# Patient Record
Sex: Female | Born: 1965 | Race: Black or African American | Hispanic: No | Marital: Married | Smoking: Never smoker
Health system: Southern US, Community
[De-identification: ages and names within clinical notes are randomized; demographics above are authoritative.]

## PROBLEM LIST (undated history)

## (undated) DIAGNOSIS — I639 Cerebral infarction, unspecified: Secondary | ICD-10-CM

## (undated) DIAGNOSIS — M1990 Unspecified osteoarthritis, unspecified site: Secondary | ICD-10-CM

## (undated) DIAGNOSIS — E119 Type 2 diabetes mellitus without complications: Secondary | ICD-10-CM

## (undated) DIAGNOSIS — I10 Essential (primary) hypertension: Secondary | ICD-10-CM

## (undated) DIAGNOSIS — D649 Anemia, unspecified: Secondary | ICD-10-CM

## (undated) HISTORY — DX: Anemia, unspecified: D64.9

## (undated) HISTORY — DX: Cerebral infarction, unspecified: I63.9

## (undated) HISTORY — DX: Unspecified osteoarthritis, unspecified site: M19.90

---

## 1999-03-09 ENCOUNTER — Encounter: Payer: Self-pay | Admitting: *Deleted

## 1999-03-09 ENCOUNTER — Ambulatory Visit (HOSPITAL_COMMUNITY): Admission: RE | Admit: 1999-03-09 | Discharge: 1999-03-09 | Payer: Self-pay | Admitting: *Deleted

## 1999-07-13 ENCOUNTER — Encounter (INDEPENDENT_AMBULATORY_CARE_PROVIDER_SITE_OTHER): Payer: Self-pay

## 1999-07-13 ENCOUNTER — Encounter: Payer: Self-pay | Admitting: Obstetrics

## 1999-07-13 ENCOUNTER — Inpatient Hospital Stay (HOSPITAL_COMMUNITY): Admission: AD | Admit: 1999-07-13 | Discharge: 1999-07-16 | Payer: Self-pay | Admitting: *Deleted

## 1999-08-11 ENCOUNTER — Encounter: Admission: RE | Admit: 1999-08-11 | Discharge: 1999-11-09 | Payer: Self-pay | Admitting: *Deleted

## 2000-04-11 ENCOUNTER — Ambulatory Visit (HOSPITAL_COMMUNITY): Admission: RE | Admit: 2000-04-11 | Discharge: 2000-04-11 | Payer: Self-pay | Admitting: *Deleted

## 2009-09-15 ENCOUNTER — Encounter: Payer: Self-pay | Admitting: Internal Medicine

## 2009-09-24 LAB — CONVERTED CEMR LAB: Pap Smear: NORMAL

## 2009-09-30 ENCOUNTER — Encounter: Payer: Self-pay | Admitting: Internal Medicine

## 2009-10-04 ENCOUNTER — Ambulatory Visit: Payer: Self-pay | Admitting: Internal Medicine

## 2009-10-04 DIAGNOSIS — E1165 Type 2 diabetes mellitus with hyperglycemia: Secondary | ICD-10-CM

## 2009-10-04 DIAGNOSIS — E7800 Pure hypercholesterolemia, unspecified: Secondary | ICD-10-CM

## 2009-10-04 DIAGNOSIS — IMO0002 Reserved for concepts with insufficient information to code with codable children: Secondary | ICD-10-CM | POA: Insufficient documentation

## 2009-11-08 ENCOUNTER — Telehealth: Payer: Self-pay | Admitting: Internal Medicine

## 2009-11-08 ENCOUNTER — Ambulatory Visit: Payer: Self-pay | Admitting: Internal Medicine

## 2010-08-04 ENCOUNTER — Ambulatory Visit: Payer: Self-pay | Admitting: Internal Medicine

## 2010-08-04 LAB — CONVERTED CEMR LAB
ALT: 24 units/L (ref 0–35)
AST: 20 units/L (ref 0–37)
Albumin: 3.7 g/dL (ref 3.5–5.2)
Alkaline Phosphatase: 59 units/L (ref 39–117)
BUN: 10 mg/dL (ref 6–23)
Basophils Absolute: 0.1 10*3/uL (ref 0.0–0.1)
Basophils Relative: 1.3 % (ref 0.0–3.0)
Bilirubin Urine: NEGATIVE
Bilirubin, Direct: 0 mg/dL (ref 0.0–0.3)
CO2: 25 meq/L (ref 19–32)
Calcium: 9.2 mg/dL (ref 8.4–10.5)
Chloride: 102 meq/L (ref 96–112)
Cholesterol: 256 mg/dL — ABNORMAL HIGH (ref 0–200)
Creatinine, Ser: 0.6 mg/dL (ref 0.4–1.2)
Creatinine,U: 52.7 mg/dL
Direct LDL: 181.9 mg/dL
Eosinophils Absolute: 0.1 10*3/uL (ref 0.0–0.7)
Eosinophils Relative: 1.8 % (ref 0.0–5.0)
GFR calc non Af Amer: 148.33 mL/min (ref >60)
Glucose, Bld: 307 mg/dL — ABNORMAL HIGH (ref 70–99)
HCT: 39.6 % (ref 36.0–46.0)
HDL: 52.2 mg/dL (ref >39.00)
Hemoglobin, Urine: NEGATIVE
Hemoglobin: 13.5 g/dL (ref 12.0–15.0)
Hgb A1c MFr Bld: 8.5 % — ABNORMAL HIGH (ref 4.6–6.5)
Ketones, ur: NEGATIVE mg/dL
Leukocytes, UA: NEGATIVE
Lymphocytes Relative: 28.5 % (ref 12.0–46.0)
Lymphs Abs: 2.2 10*3/uL (ref 0.7–4.0)
MCHC: 34.2 g/dL (ref 30.0–36.0)
MCV: 92 fL (ref 78.0–100.0)
Microalb Creat Ratio: 1.1 mg/g (ref 0.0–30.0)
Microalb, Ur: 0.6 mg/dL (ref 0.0–1.9)
Monocytes Absolute: 0.6 10*3/uL (ref 0.1–1.0)
Monocytes Relative: 7.3 % (ref 3.0–12.0)
Neutro Abs: 4.8 10*3/uL (ref 1.4–7.7)
Neutrophils Relative %: 61.1 % (ref 43.0–77.0)
Nitrite: NEGATIVE
Platelets: 328 10*3/uL (ref 150.0–400.0)
Potassium: 4.5 meq/L (ref 3.5–5.1)
RBC: 4.31 M/uL (ref 3.87–5.11)
RDW: 13.6 % (ref 11.5–14.6)
Sodium: 134 meq/L — ABNORMAL LOW (ref 135–145)
Specific Gravity, Urine: 1.015 (ref 1.000–1.030)
TSH: 1.03 microintl units/mL (ref 0.35–5.50)
Total Bilirubin: 0.7 mg/dL (ref 0.3–1.2)
Total CHOL/HDL Ratio: 5
Total Protein, Urine: NEGATIVE mg/dL
Total Protein: 6.9 g/dL (ref 6.0–8.3)
Triglycerides: 140 mg/dL (ref 0.0–149.0)
Urine Glucose: >=1000 mg/dL
Urobilinogen, UA: 0.2 (ref 0.0–1.0)
VLDL: 28 mg/dL (ref 0.0–40.0)
WBC: 7.9 10*3/uL (ref 4.5–10.5)
pH: 6.5 (ref 5.0–8.0)

## 2010-08-05 ENCOUNTER — Telehealth: Payer: Self-pay | Admitting: Internal Medicine

## 2010-08-17 ENCOUNTER — Encounter: Admission: RE | Admit: 2010-08-17 | Discharge: 2010-08-17 | Payer: Self-pay | Admitting: Internal Medicine

## 2010-08-22 ENCOUNTER — Encounter
Admission: RE | Admit: 2010-08-22 | Discharge: 2010-08-22 | Payer: Self-pay | Source: Home / Self Care | Attending: Internal Medicine | Admitting: Internal Medicine

## 2010-08-30 ENCOUNTER — Telehealth: Payer: Self-pay | Admitting: Internal Medicine

## 2010-09-13 ENCOUNTER — Telehealth: Payer: Self-pay | Admitting: Internal Medicine

## 2010-11-06 ENCOUNTER — Encounter: Payer: Self-pay | Admitting: Obstetrics

## 2010-11-15 NOTE — Assessment & Plan Note (Signed)
Summary: 1 MO ROV /NWS  #   Vital Signs:  Patient profile:   45 year old female Menstrual status:  regular Height:      62 inches Weight:      223 pounds BMI:     40.93 O2 Sat:      98 % on Room air Temp:     98.5 degrees F oral Pulse rate:   70 / minute Pulse rhythm:   regular Resp:     16 per minute BP sitting:   136 / 80  (left arm) Cuff size:   large  Vitals Entered By: Rock Nephew CMA (November 08, 2009 8:38 AM)  O2 Flow:  Room air CC: follow-up visit, Hypertension Management Is Patient Diabetic? Yes Did you bring your meter with you today? No   Primary Care Provider:  Etta Grandchild MD  CC:  follow-up visit and Hypertension Management.  History of Present Illness: She returns stating that she feels well. Her BS has come down to 120-145.  Hypertension History:      She denies headache, chest pain, palpitations, dyspnea with exertion, orthopnea, PND, peripheral edema, visual symptoms, neurologic problems, syncope, and side effects from treatment.  She notes no problems with any antihypertensive medication side effects.        Positive major cardiovascular risk factors include diabetes, hyperlipidemia, and hypertension.  Negative major cardiovascular risk factors include female age less than 24 years old, negative family history for ischemic heart disease, and non-tobacco-user status.        Further assessment for target organ damage reveals no history of ASHD, cardiac end-organ damage (CHF/LVH), stroke/TIA, peripheral vascular disease, renal insufficiency, or hypertensive retinopathy.     Current Medications (verified): 1)  Janumet 50-500 Mg Tabs (Sitagliptin-Metformin Hcl) .... One By Mouth Two Times A Day For Diabetes 2)  Valturna 150-160 Mg Tabs (Aliskiren-Valsartan) .... One By Mouth Once Daily For High Blood Pressure  Allergies (verified): No Known Drug Allergies  Past History:  Past Medical History: Reviewed history from 10/04/2009 and no changes  required. Diabetes mellitus, type II Hyperlipidemia Hypertension  Past Surgical History: Reviewed history from 10/04/2009 and no changes required. Tubal ligation Caesarean section  Family History: Reviewed history from 10/04/2009 and no changes required. Family History Diabetes 1st degree relative Family History High cholesterol Family History Hypertension  Social History: Reviewed history from 10/04/2009 and no changes required. Occupation: Associate Professor Married Never Smoked Alcohol use-yes Drug use-no Regular exercise-no  Review of Systems  The patient denies anorexia, abdominal pain, difficulty walking, and depression.   Endo:  Denies cold intolerance, excessive hunger, excessive thirst, excessive urination, polyuria, and weight change.  Physical Exam  General:  alert, well-developed, well-nourished, well-hydrated, appropriate dress, normal appearance, healthy-appearing, cooperative to examination, good hygiene, and overweight-appearing.   Mouth:  Oral mucosa and oropharynx without lesions or exudates.  Teeth in good repair. Neck:  supple, full ROM, no masses, no thyromegaly, no thyroid nodules or tenderness, no JVD, no cervical lymphadenopathy, and no neck tenderness.   Lungs:  normal respiratory effort, no intercostal retractions, no accessory muscle use, normal breath sounds, and no dullness.   Heart:  normal rate, regular rhythm, no murmur, no gallop, no rub, and no JVD.   Abdomen:  soft, non-tender, normal bowel sounds, no distention, no masses, no guarding, no rigidity, no rebound tenderness, no hepatomegaly, and no splenomegaly.   Msk:  normal ROM, no joint tenderness, no joint swelling, no joint warmth, no redness over joints, no joint deformities,  no joint instability, and no crepitation.   Pulses:  R and L carotid,radial,femoral,dorsalis pedis and posterior tibial pulses are full and equal bilaterally Extremities:  No clubbing, cyanosis, edema, or deformity noted  with normal full range of motion of all joints.   Neurologic:  No cranial nerve deficits noted. Station and gait are normal. Plantar reflexes are down-going bilaterally. DTRs are symmetrical throughout. Sensory, motor and coordinative functions appear intact. Skin:  turgor normal, color normal, no rashes, no suspicious lesions, no ecchymoses, no petechiae, and no purpura.   Cervical Nodes:  no anterior cervical adenopathy and no posterior cervical adenopathy.   Psych:  Oriented X3, memory intact for recent and remote, normally interactive, good eye contact, not anxious appearing, not agitated, not suicidal, and tearful.    Diabetes Management Exam:    Foot Exam (with socks and/or shoes not present):       Sensory-Pinprick/Light touch:          Left medial foot (L-4): normal          Left dorsal foot (L-5): normal          Left lateral foot (S-1): normal          Right medial foot (L-4): normal          Right dorsal foot (L-5): normal          Right lateral foot (S-1): normal       Sensory-Monofilament:          Left foot: normal          Right foot: normal       Inspection:          Left foot: normal          Right foot: normal       Nails:          Left foot: normal          Right foot: normal   Impression & Recommendations:  Problem # 1:  HYPERTENSION (ICD-401.9) Assessment Improved  Her updated medication list for this problem includes:    Valturna 150-160 Mg Tabs (Aliskiren-valsartan) ..... One by mouth once daily for high blood pressure  BP today: 136/80 Prior BP: 158/100 (10/04/2009)  Prior 10 Yr Risk Heart Disease: Not enough information (10/04/2009)  Problem # 2:  DIABETES MELLITUS, TYPE II (ICD-250.00) Assessment: Improved  Her updated medication list for this problem includes:    Janumet 50-500 Mg Tabs (Sitagliptin-metformin hcl) ..... One by mouth two times a day for diabetes  Orders: Ophthalmology Referral (Ophthalmology)  Complete Medication List: 1)   Janumet 50-500 Mg Tabs (Sitagliptin-metformin hcl) .... One by mouth two times a day for diabetes 2)  Valturna 150-160 Mg Tabs (Aliskiren-valsartan) .... One by mouth once daily for high blood pressure 3)  Onetouch Ultra Mini W/device Kit (Blood glucose monitoring suppl) .... Use two times a day as directed  Hypertension Assessment/Plan:      The patient's hypertensive risk group is category C: Target organ damage and/or diabetes.  Today's blood pressure is 136/80.  Her blood pressure goal is < 130/80.  Patient Instructions: 1)  Please schedule a follow-up appointment in 3 months. 2)  It is important that you exercise regularly at least 20 minutes 5 times a week. If you develop chest pain, have severe difficulty breathing, or feel very tired , stop exercising immediately and seek medical attention. 3)  You need to lose weight. Consider a lower calorie diet and regular exercise.  4)  Check your blood sugars regularly. If your readings are usually above : or below 70 you should contact our office. 5)  It is important that your Diabetic A1c level is checked every 3 months. 6)  See your eye doctor yearly to check for diabetic eye damage. 7)  Check your feet each night for sore areas, calluses or signs of infection. 8)  Check your Blood Pressure regularly. If it is above: you should make an appointment. Prescriptions: VALTURNA 150-160 MG TABS (ALISKIREN-VALSARTAN) One by mouth once daily for high blood pressure  #28 x 0   Entered and Authorized by:   Etta Grandchild MD   Signed by:   Etta Grandchild MD on 11/08/2009   Method used:   Samples Given   RxID:   [id number] JANUMET 50-500 MG TABS (SITAGLIPTIN-METFORMIN HCL) One by mouth two times a day for diabetes  #112 x 0   Entered and Authorized by:   Etta Grandchild MD   Signed by:   Etta Grandchild MD on 11/08/2009   Method used:   Samples Given   RxID:   [id number] ONETOUCH ULTRA MINI W/DEVICE KIT (BLOOD GLUCOSE MONITORING SUPPL)  Use two times a day as directed  #1 x 0   Entered and Authorized by:   Etta Grandchild MD   Signed by:   Etta Grandchild MD on 11/08/2009   Method used:   Samples Given   RxID:   [id number]

## 2010-11-15 NOTE — Letter (Signed)
Summary: Lipid Letter  [facility]  [address] [place], [place] [address]   Phone: [phone]  Fax: [phone]    08/04/2010  Harika Laidlaw [address] [place], [place]  [address]  Dear Kathie Rhodes:  We have carefully reviewed your last lipid profile from  and the results are noted below with a summary of recommendations for lipid management.    Cholesterol:       256     Goal: <200 high   HDL "good" Cholesterol:   62.95     Goal: >50    LDL "bad" Cholesterol:   182     Goal: <100 high   Triglycerides:       140.0     Goal: <150        TLC Diet (Therapeutic Lifestyle Change): Saturated Fats & Transfatty acids should be kept < 7% of total calories ***Reduce Saturated Fats Polyunstaurated Fat can be up to 10% of total calories Monounsaturated Fat Fat can be up to 20% of total calories Total Fat should be no greater than 25-35% of total calories Carbohydrates should be 50-60% of total calories Protein should be approximately 15% of total calories Fiber should be at least 20-30 grams a day ***Increased fiber may help lower LDL Total Cholesterol should be < 200mg /day Consider adding plant stanol/sterols to diet (example: Benacol spread) ***A higher intake of unsaturated fat may reduce Triglycerides and Increase HDL    Adjunctive Measures (may lower LIPIDS and reduce risk of Heart Attack) include: Aerobic Exercise (20-30 minutes 3-4 times a week) Limit Alcohol Consumption Weight Reduction Aspirin 75-81 mg a day by mouth (if not allergic or contraindicated) Dietary Fiber 20-30 grams a day by mouth     Current Medications: 1)    Onetouch Ultra Mini W/device Kit (Blood glucose monitoring suppl) .... Use two times a day as directed 2)    Onetouch Ultra Test  Strp (Glucose blood) .... Test as directed 3)    Diovan 160 Mg Tabs (Valsartan) .... One by mouth once daily for high blood pressure 4)    Actos 15 Mg Tabs (Pioglitazone hcl) .... One by mouth once daily for  diabetes 5)    Januvia 100 Mg Tabs (Sitagliptin phosphate) .... One by mouth once daily for diabetes  If you have any questions, please call. We appreciate being able to work with you.   Sincerely,    [facility] Etta Grandchild MD

## 2010-11-15 NOTE — Assessment & Plan Note (Signed)
Summary: FU Natale Milch  #   Vital Signs:  Patient profile:   45 year old female Menstrual status:  regular LMP:     07/06/2010 Height:      62 inches Weight:      235 pounds BMI:     43.14 O2 Sat:      97 % on Room air Temp:     98.6 degrees F oral Pulse rate:   63 / minute Pulse rhythm:   regular Resp:     16 per minute BP sitting:   136 / 88  (left arm) Cuff size:   large  Vitals Entered By: Rock Nephew CMA (August 04, 2010 8:36 AM)  Nutrition Counseling: Patient's BMI is greater than 25 and therefore counseled on weight management options.  O2 Flow:  Room air CC: follow-up visit, Preventive Care Is Patient Diabetic? Yes Did you bring your meter with you today? No Pain Assessment Patient in pain? no       Does patient need assistance? Functional Status Self care Ambulation Normal LMP (date): 07/06/2010     Enter LMP: 07/06/2010 Last PAP Result normal   Primary Care Provider:  Etta Grandchild MD  CC:  follow-up visit and Preventive Care.  History of Present Illness:  Follow-Up Visit      This is a 45 year old woman who presents for Follow-up visit.  The patient denies chest pain, palpitations, dizziness, syncope, low blood sugar symptoms, high blood sugar symptoms, edema, SOB, DOE, PND, and orthopnea.  Since the last visit the patient notes problems with medications.  The patient reports not taking meds as prescribed, not monitoring BP, monitoring blood sugars, and dietary noncompliance.  When questioned about possible medication side effects, the patient notes GI upset.    Preventive Screening-Counseling & Management  Alcohol-Tobacco     Alcohol drinks/day: <1     Alcohol type: wine     >5/day in last 3 mos: no     Alcohol Counseling: not indicated; use of alcohol is not excessive or problematic     Feels need to cut down: no     Feels annoyed by complaints: no     Feels guilty re: drinking: no     Needs 'eye opener' in am: no     Smoking Status: never     Tobacco Counseling: not indicated; no tobacco use  Hep-HIV-STD-Contraception     Hepatitis Risk: no risk noted     HIV Risk: no risk noted     SBE monthly: yes     SBE Education/Counseling: not indicated; SBE done regularly      Sexual History:  currently monogamous.        Drug Use:  no.        Blood Transfusions:  no.    Medications Prior to Update: 1)  Janumet 50-500 Mg Tabs (Sitagliptin-Metformin Hcl) .... One By Mouth Two Times A Day For Diabetes 2)  Valturna 150-160 Mg Tabs (Aliskiren-Valsartan) .... One By Mouth Once Daily For High Blood Pressure 3)  Onetouch Ultra Mini W/device Kit (Blood Glucose Monitoring Suppl) .... Use Two Times A Day As Directed 4)  Onetouch Ultra Test  Strp (Glucose Blood) .... Test As Directed  Current Medications (verified): 1)  Onetouch Ultra Mini W/device Kit (Blood Glucose Monitoring Suppl) .... Use Two Times A Day As Directed 2)  Onetouch Ultra Test  Strp (Glucose Blood) .... Test As Directed 3)  Diovan 160 Mg Tabs (Valsartan) .... One By Mouth Once Daily  For High Blood Pressure 4)  Actos 15 Mg Tabs (Pioglitazone Hcl) .... One By Mouth Once Daily For Diabetes 5)  Januvia 100 Mg Tabs (Sitagliptin Phosphate) .... One By Mouth Once Daily For Diabetes  Allergies (verified): 1)  ! Metformin Hcl  Past History:  Past Medical History: Last updated: 10/04/2009 Diabetes mellitus, type II Hyperlipidemia Hypertension  Past Surgical History: Last updated: 10/04/2009 Tubal ligation Caesarean section  Family History: Last updated: 10/04/2009 Family History Diabetes 1st degree relative Family History High cholesterol Family History Hypertension  Social History: Last updated: 10/04/2009 Occupation: Cosmetologist Married Never Smoked Alcohol use-yes Drug use-no Regular exercise-no  Risk Factors: Alcohol Use: <1 (08/04/2010) >5 drinks/d w/in last 3 months: no (08/04/2010) Exercise: no (10/04/2009)  Risk Factors: Smoking Status:  never (08/04/2010)  Family History: Reviewed history from 10/04/2009 and no changes required. Family History Diabetes 1st degree relative Family History High cholesterol Family History Hypertension  Social History: Reviewed history from 10/04/2009 and no changes required. Occupation: Cosmetologist Married Never Smoked Alcohol use-yes Drug use-no Regular exercise-no  Review of Systems       The patient complains of weight gain.  The patient denies anorexia, fever, weight loss, chest pain, syncope, dyspnea on exertion, peripheral edema, prolonged cough, headaches, hemoptysis, abdominal pain, hematuria, difficulty walking, depression, and angioedema.   CV:  Denies chest pain or discomfort, fainting, fatigue, leg cramps with exertion, lightheadness, near fainting, palpitations, shortness of breath with exertion, and swelling of feet. Endo:  Complains of polyuria and weight change; denies cold intolerance, excessive hunger, excessive thirst, excessive urination, and heat intolerance.  Physical Exam  General:  alert, well-developed, well-nourished, well-hydrated, appropriate dress, normal appearance, healthy-appearing, cooperative to examination, good hygiene, and overweight-appearing.   Head:  normocephalic and atraumatic.   Eyes:  vision grossly intact, pupils equal, and pupils round.   Mouth:  Oral mucosa and oropharynx without lesions or exudates.  Teeth in good repair. Neck:  supple, full ROM, no masses, no thyromegaly, no thyroid nodules or tenderness, no JVD, no cervical lymphadenopathy, and no neck tenderness.   Lungs:  normal respiratory effort, no intercostal retractions, no accessory muscle use, normal breath sounds, and no dullness.   Heart:  normal rate, regular rhythm, no murmur, no gallop, no rub, and no JVD.   Abdomen:  soft, non-tender, normal bowel sounds, no distention, no masses, no guarding, no rigidity, no rebound tenderness, no hepatomegaly, and no splenomegaly.     Msk:  normal ROM, no joint tenderness, no joint swelling, no joint warmth, no redness over joints, no joint deformities, no joint instability, and no crepitation.   Pulses:  R and L carotid,radial,femoral,dorsalis pedis and posterior tibial pulses are full and equal bilaterally Extremities:  No clubbing, cyanosis, edema, or deformity noted with normal full range of motion of all joints.   Neurologic:  No cranial nerve deficits noted. Station and gait are normal. Plantar reflexes are down-going bilaterally. DTRs are symmetrical throughout. Sensory, motor and coordinative functions appear intact. Skin:  turgor normal, color normal, no rashes, no suspicious lesions, no ecchymoses, no petechiae, and no purpura.   Cervical Nodes:  no anterior cervical adenopathy and no posterior cervical adenopathy.   Psych:  Oriented X3, memory intact for recent and remote, normally interactive, good eye contact, not anxious appearing, not agitated, not suicidal, and tearful.    Diabetes Management Exam:    Foot Exam (with socks and/or shoes not present):       Sensory-Pinprick/Light touch:  Left medial foot (L-4): normal          Left dorsal foot (L-5): normal          Left lateral foot (S-1): normal          Right medial foot (L-4): normal          Right dorsal foot (L-5): normal          Right lateral foot (S-1): normal       Sensory-Monofilament:          Left foot: normal          Right foot: normal       Inspection:          Left foot: normal          Right foot: normal       Nails:          Left foot: normal          Right foot: normal    Eye Exam:       Eye Exam done elsewhere          Date: 11/11/2009          Results: normal          Done by: ?   Impression & Recommendations:  Problem # 1:  HYPERTENSION (ICD-401.9) Assessment Unchanged  The following medications were removed from the medication list:    Valturna 150-160 Mg Tabs (Aliskiren-valsartan) ..... One by mouth once daily  for high blood pressure Her updated medication list for this problem includes:    Diovan 160 Mg Tabs (Valsartan) ..... One by mouth once daily for high blood pressure  Orders: Venipuncture (13244) TLB-Lipid Panel (80061-LIPID) TLB-BMP (Basic Metabolic Panel-BMET) (80048-METABOL) TLB-CBC Platelet - w/Differential (85025-CBCD) TLB-TSH (Thyroid Stimulating Hormone) (84443-TSH) TLB-Hepatic/Liver Function Pnl (80076-HEPATIC) TLB-A1C / Hgb A1C (Glycohemoglobin) (83036-A1C) TLB-Udip w/ Micro (81001-URINE) TLB-Microalbumin/Creat Ratio, Urine (82043-MALB)  BP today: 136/88 Prior BP: 136/80 (11/08/2009)  Prior 10 Yr Risk Heart Disease: Not enough information (10/04/2009)  Problem # 2:  DIABETES MELLITUS, TYPE II (ICD-250.00) Assessment: Deteriorated  The following medications were removed from the medication list:    Janumet 50-500 Mg Tabs (Sitagliptin-metformin hcl) ..... One by mouth two times a day for diabetes Her updated medication list for this problem includes:    Diovan 160 Mg Tabs (Valsartan) ..... One by mouth once daily for high blood pressure    Actos 15 Mg Tabs (Pioglitazone hcl) ..... One by mouth once daily for diabetes    Januvia 100 Mg Tabs (Sitagliptin phosphate) ..... One by mouth once daily for diabetes  Orders: Venipuncture (01027) TLB-Lipid Panel (80061-LIPID) TLB-BMP (Basic Metabolic Panel-BMET) (80048-METABOL) TLB-CBC Platelet - w/Differential (85025-CBCD) TLB-TSH (Thyroid Stimulating Hormone) (84443-TSH) TLB-Hepatic/Liver Function Pnl (80076-HEPATIC) TLB-A1C / Hgb A1C (Glycohemoglobin) (83036-A1C) TLB-Udip w/ Micro (81001-URINE) TLB-Microalbumin/Creat Ratio, Urine (82043-MALB) Ophthalmology Referral (Ophthalmology) Diabetic Clinic Referral (Diabetic)  Last Eye Exam: normal (11/11/2009)  Problem # 3:  HYPERLIPIDEMIA (ICD-272.4) Assessment: Unchanged  Orders: Venipuncture (25366) TLB-Lipid Panel (80061-LIPID) TLB-BMP (Basic Metabolic Panel-BMET)  (80048-METABOL) TLB-CBC Platelet - w/Differential (85025-CBCD) TLB-TSH (Thyroid Stimulating Hormone) (84443-TSH) TLB-Hepatic/Liver Function Pnl (80076-HEPATIC) TLB-A1C / Hgb A1C (Glycohemoglobin) (83036-A1C) TLB-Udip w/ Micro (81001-URINE) TLB-Microalbumin/Creat Ratio, Urine (82043-MALB)  Complete Medication List: 1)  Onetouch Ultra Mini W/device Kit (Blood glucose monitoring suppl) .... Use two times a day as directed 2)  Onetouch Ultra Test Strp (Glucose blood) .... Test as directed 3)  Diovan 160 Mg Tabs (Valsartan) .... One by mouth once daily for high blood  pressure 4)  Actos 15 Mg Tabs (Pioglitazone hcl) .... One by mouth once daily for diabetes 5)  Januvia 100 Mg Tabs (Sitagliptin phosphate) .... One by mouth once daily for diabetes  Other Orders: Radiology Referral (Radiology) Tdap => 19yrs IM (16109) Admin 1st Vaccine (60454)  PAP Screening:    Last PAP smear:  09/24/2009    Reviewed PAP smear recommendations:  patient defers to GYN provider  Mammogram Screening:    Reviewed Mammogram recommendations:  mammogram ordered  Osteoporosis Risk Assessment:  Risk Factors for Fracture or Low Bone Density:   Smoking status:       never  Immunization & Chemoprophylaxis:    Tetanus vaccine: Tdap  (08/04/2010)  Patient Instructions: 1)  Please schedule a follow-up appointment in 2 months. 2)  It is important that you exercise regularly at least 20 minutes 5 times a week. If you develop chest pain, have severe difficulty breathing, or feel very tired , stop exercising immediately and seek medical attention. 3)  You need to lose weight. Consider a lower calorie diet and regular exercise.  4)  Schedule your mammogram. 5)  Check your blood sugars regularly. If your readings are usually above 200 or below 70 you should contact our office. 6)  It is important that your Diabetic A1c level is checked every 3 months. 7)  See your eye doctor yearly to check for diabetic eye damage. 8)   Check your feet each night for sore areas, calluses or signs of infection. 9)  Check your Blood Pressure regularly. If it is above 130/80: you should make an appointment. Prescriptions: JANUVIA 100 MG TABS (SITAGLIPTIN PHOSPHATE) One by mouth once daily for diabetes  #30 x 11   Entered and Authorized by:   Etta Grandchild MD   Signed by:   Etta Grandchild MD on 08/04/2010   Method used:   [facility]        [facility]  [address] [phone]* (retail)       [address]       [place], [place]  [id number]       Ph: [phone] or [phone]       Fax: [phone]   RxID:   [id number] ACTOS 15 MG TABS (PIOGLITAZONE HCL) One by mouth once daily for diabetes  #42 x 0   Entered and Authorized by:   Etta Grandchild MD   Signed by:   Etta Grandchild MD on 08/04/2010   Method used:   Samples Given   RxID:   [id number] DIOVAN 160 MG TABS (VALSARTAN) One by mouth once daily for high blood pressure  #112 x 0   Entered and Authorized by:   Etta Grandchild MD   Signed by:   Etta Grandchild MD on 08/04/2010   Method used:   Samples Given   RxID:   [id number]    Orders Added: 1)  Venipuncture [36415] 2)  TLB-Lipid Panel [80061-LIPID] 3)  TLB-BMP (Basic Metabolic Panel-BMET) [80048-METABOL] 4)  TLB-CBC Platelet - w/Differential [85025-CBCD] 5)  TLB-TSH (Thyroid Stimulating Hormone) [84443-TSH] 6)  TLB-Hepatic/Liver Function Pnl [80076-HEPATIC] 7)  TLB-A1C / Hgb A1C (Glycohemoglobin) [83036-A1C] 8)  TLB-Udip w/ Micro [81001-URINE] 9)  TLB-Microalbumin/Creat Ratio, Urine [82043-MALB] 10)  Radiology Referral [Radiology] 11)  Ophthalmology Referral [Ophthalmology] 12)  Diabetic Clinic Referral [Diabetic] 13)  Tdap => 38yrs IM [90715] 14)  Admin 1st Vaccine [90471] 15)  Est. Patient Level V [55732]  Immunizations Administered:  Tetanus Vaccine:    Vaccine Type: Tdap    Site: left deltoid    Mfr: GlaxoSmithKline    Dose: 0.5 ml    Route:  IM    Given by: Rock Nephew CMA    Exp. Date: 08/04/2012    Lot #: [id number]    VIS given: 09/02/08 version given August 04, 2010.  Not Administered:    Influenza Vaccine not given due to: declined   Immunizations Administered:  Tetanus Vaccine:    Vaccine Type: Tdap    Site: left deltoid    Mfr: GlaxoSmithKline    Dose: 0.5 ml    Route: IM    Given by: Rock Nephew CMA    Exp. Date: 08/04/2012    Lot #: [id number]    VIS given: 09/02/08 version given August 04, 2010.      Prevention & Chronic Care Immunizations   Influenza vaccine: Not documented   Influenza vaccine deferral: Refused  (08/04/2010)    Tetanus booster: 08/04/2010: Tdap    Pneumococcal vaccine: Not documented  Other Screening   Pap smear: normal  (09/24/2009)   Pap smear action/deferral: patient defers to GYN provider  (08/04/2010)    Mammogram: Not documented   Mammogram action/deferral: mammogram ordered  (08/04/2010)   Smoking status: never  (08/04/2010)  Diabetes Mellitus   HgbA1C: Not documented    Eye exam: normal  (11/11/2009)   Eye exam due: 11/2010    Foot exam: yes  (08/04/2010)   High risk foot: Not documented   Foot care education: Not documented    Urine microalbumin/creatinine ratio: Not documented  Lipids   Total Cholesterol: Not documented   LDL: Not documented   LDL Direct: Not documented   HDL: Not documented   Triglycerides: Not documented    SGOT (AST): Not documented   SGPT (ALT): Not documented   Alkaline phosphatase: Not documented   Total bilirubin: Not documented  Hypertension   Last Blood Pressure: 136 / 88  (08/04/2010)   Serum creatinine: Not documented   Serum potassium Not documented  Self-Management Support :    Diabetes self-management support: Not documented   Referred for diabetes self-mgmt training.    Hypertension self-management support: Not documented    Lipid self-management support: Not documented

## 2010-11-15 NOTE — Progress Notes (Signed)
Summary: Facial Hair  Phone Note Call from Patient Call back at Home Phone [phone]   Summary of Call: Patient is requesting rx for hair removal cream. She has facial hair and would like prescription strength cream.  Initial call taken by: Lamar Sprinkles, CMA,  November 08, 2009 10:17 AM  Follow-up for Phone Call        what cream ? Follow-up by: Etta Grandchild MD,  November 08, 2009 10:27 AM  Additional Follow-up for Phone Call Additional follow up Details #1::        Spoke with pt, she has never had rx before. She researched a prescription med but can not remember the name.  Additional Follow-up by: Lamar Sprinkles, CMA,  November 08, 2009 11:15 AM

## 2010-11-15 NOTE — Progress Notes (Signed)
Summary: REQ FOR METFORMIN  Phone Note Call from Patient   Summary of Call: Pt's fasting blood sugar remains elevated and she does not like the side effects of glimepride. Patient is requesting to resume metformin.  Initial call taken by: Lamar Sprinkles, CMA,  September 13, 2010 10:19 AM    New/Updated Medications: METFORMIN HCL 750 MG XR24H-TAB (METFORMIN HCL) one by mouth once daily Prescriptions: METFORMIN HCL 750 MG XR24H-TAB (METFORMIN HCL) one by mouth once daily  #30 x 11   Entered and Authorized by:   Etta Grandchild MD   Signed by:   Etta Grandchild MD on 09/13/2010   Method used:   [facility]        [facility] [phone]* (retail)       [address]       [place], [place]  [address]       Ph: [phone] or [phone]       Fax: [phone]   RxID:   [id number]   Appended Document: REQ FOR METFORMIN Pt informed

## 2010-11-15 NOTE — Progress Notes (Signed)
Summary: Ophthalmology Referral  Phone Note Call from Patient   Summary of Call: Dr Yetta Barre,  Patient said she had eye exam less then six months ago with Dr Lawerance Bach, not due at this time. Initial call taken by: Dagoberto Reef,  August 05, 2010 2:20 PM        Diabetes Management Exam:    Eye Exam:       Eye Exam done elsewhere          Date: 02/02/2010          Results: normal          Done by: Lawerance Bach

## 2010-11-15 NOTE — Progress Notes (Signed)
Summary: MED PROBLEM  Phone Note Call from Patient Call back at Home Phone [phone]   Summary of Call: Pt had recent change in diabetic meds - Janumet to januiva due to diarrhea after eating greasy foods. Pt says cbg's are now elevated, fasting is always over 200's. Patient is requesting to resume janumet and will be more careful of what she eats.  Initial call taken by: Lamar Sprinkles, CMA,  August 30, 2010 9:58 AM  Follow-up for Phone Call        no on the metforminm start glimeperide Follow-up by: Etta Grandchild MD,  August 30, 2010 10:03 AM  Additional Follow-up for Phone Call Additional follow up Details #1::        Pt informed  Additional Follow-up by: Lamar Sprinkles, CMA,  August 30, 2010 5:25 PM    New/Updated Medications: GLIMEPIRIDE 1 MG TABS (GLIMEPIRIDE) one by mouth once daily Prescriptions: GLIMEPIRIDE 1 MG TABS (GLIMEPIRIDE) one by mouth once daily  #30 x 11   Entered and Authorized by:   Etta Grandchild MD   Signed by:   Etta Grandchild MD on 08/30/2010   Method used:   [facility] to        [facility] [address] [phone]* (retail)       [address]       [place], [place]  [id number]       Ph: [phone] or [phone]       Fax: [phone]   RxID:   [id number]

## 2011-01-10 ENCOUNTER — Telehealth: Payer: Self-pay | Admitting: *Deleted

## 2011-01-10 MED ORDER — VALSARTAN 160 MG PO TABS: 160.0000 mg | ORAL_TABLET | Freq: Every day | ORAL | Status: DC

## 2011-01-10 NOTE — Telephone Encounter (Signed)
Pt left vm - needs samples or rx of valturna. This med was removed and changed to Diovan 160 1 qd. Need to call pt to clarify what she is taking and provide rx.

## 2011-01-10 NOTE — Telephone Encounter (Signed)
Pt taking diovan per our records, rf sent, pt aware

## 2011-01-13 ENCOUNTER — Telehealth: Payer: Self-pay | Admitting: Internal Medicine

## 2011-01-13 NOTE — Telephone Encounter (Signed)
Pt states that BP med [Diovan] is $45 mth and she would like to know if we can give her samples to see if the medicine will work for her before having to pay for.

## 2011-01-13 NOTE — Telephone Encounter (Signed)
Yes, if we have the samples

## 2011-01-16 NOTE — Telephone Encounter (Signed)
LMOM to inform Pt that we are no longer receiving samples of Diovan.

## 2011-03-03 NOTE — H&P (Signed)
[facility]  Patient:    Annette Munoz, Annette Munoz                       MRN: [id number] Adm. Date:  [id number] Attending:  Deniece Ree                         History and Physical  HISTORY:                      Patient is a 45 year old multiparous female who is desirous of permanent sterilization.  All of the different types of contraceptives were presented to this patient, who is very sure of her desire for permanent sterilization.  This was discussed with the patient and her husband, who both agree.  Patient understands that this procedure is intended to be permanent; however, cannot be guaranteed.  All of her questions were answered to her satisfaction.  PAST MEDICAL HISTORY:         Her past medical history is significant in that the patient had one cesarean section and one vaginal delivery.  No other past medical history problems.  PHYSICAL EXAMINATION:  GENERAL:                      Physical examination revealed a well-developed, well-nourished, obese female in no acute distress.  HEENT:                        Within normal limits.  NECK:                         Supple.  BREASTS:                      Without masses, tenderness or discharge.  LUNGS:                        Clear to percussion and auscultation.  HEART:                        Normal sinus rhythm, without murmur, rub or gallop.  ABDOMEN:                      Massively obese; however, benign.  EXTREMITIES:                  Within normal limits.  NEUROLOGIC:                   Within normal limits.  PELVIC:                       Examination revealed external genitalia and BUS to be within normal limits.  The vagina is clear.  Cervix firm and nontender. The uterus is normal size, shape and consistency.  Adnexa benign.  DIAGNOSIS:                    Multiparity, desirous of permanent sterilization.  PLAN:                         The plan is for a laparoscopic bilateral  tubular cauterization with tubal resection. DD:  04/11/00 TD:  04/12/00 Job: [id number]

## 2011-03-03 NOTE — Op Note (Signed)
[facility]  Patient:    Annette Munoz, Annette Munoz                       MRN: [id number] Proc. Date: 04/11/00 Adm. Date:  [id number] Attending:  Deniece Ree                           Operative Report  PREOPERATIVE DIAGNOSIS:       Multiparity, desirous of permanent sterilization.   POSTOPERATIVE DIAGNOSIS:      Multiparity, desirous of permanent sterilization.  OPERATION:                    Laparoscopic bilateral tubal cauterization with tubal resection.  SURGEON:                      Deniece Ree, M.D.  ASSISTANT:  ANESTHESIA:                   General anesthesia.  ESTIMATED BLOOD LOSS:         Approximately 25 cc.  COMPLICATIONS:                None.  CONDITION:                    The patient tolerated the procedure well and returned to the recovery room in satisfactory condition.  DESCRIPTION OF PROCEDURE:     The patient was taken to the operating room and prepped and draped in the usual fashion for laparoscopic surgery.  The speculum was placed in the vagina following which the anterior lip of the cervix was then grasped with a Christella Hartigan tenaculum.  The Acorn uterine manipulating cannula was then put into place.  A subumbilical incision was then made following which the Veress needle was introduced through this incision and approximately 4 liters of carbon dioxide was then infused without any difficulty.  The laparoscopic trocar was placed through this incision following which the laparoscope was placed through its sleeve.  Visualization of the pelvic organs came into view.  The uterus, tubes, and ovaries all appeared to be within normal limits.  The right tube was then identified, grasped approximately 5 mm proximal to the right cornu, and cauterized approximately 3 to 4 cm in length.  This was done likewise on the left side. Both cauterized segments of tubes were then cut into two locations.  Hemostasis remained present.  Sponge,  needle, and instrument counts were correct x 2.  At this point, the carbon dioxide was then allowed to escape from the abdominal cavity which it did so without any problems.  The incision was then closed utilizing 4-0 Vicryl  with deep stitch followed by a subcuticular stitch.  The procedure was then terminated.  The patient tolerated the surgery well and returned to the recovery room.  The patient is to be discharged when fully alert.  She has been instructed on the possible complications and care following this type of surgery.  She has  been told to return to my office in four weeks for follow-up evaluation or to call me prior to that time should any problems arise. DD:  04/11/00 TD:  04/12/00 Job: [id number]

## 2011-09-25 ENCOUNTER — Other Ambulatory Visit: Payer: Self-pay | Admitting: Endocrinology

## 2011-10-01 ENCOUNTER — Other Ambulatory Visit: Payer: Self-pay | Admitting: Internal Medicine

## 2011-12-11 ENCOUNTER — Ambulatory Visit: Payer: Self-pay | Admitting: Internal Medicine

## 2015-03-22 ENCOUNTER — Ambulatory Visit (INDEPENDENT_AMBULATORY_CARE_PROVIDER_SITE_OTHER): Payer: 59 | Admitting: Physician Assistant

## 2015-03-22 VITALS — BP 136/82 | HR 69 | Temp 98.9°F | Resp 17 | Ht 63.0 in | Wt 225.0 lb

## 2015-03-22 DIAGNOSIS — N3289 Other specified disorders of bladder: Secondary | ICD-10-CM

## 2015-03-22 DIAGNOSIS — R81 Glycosuria: Secondary | ICD-10-CM

## 2015-03-22 DIAGNOSIS — R109 Unspecified abdominal pain: Secondary | ICD-10-CM

## 2015-03-22 DIAGNOSIS — E1165 Type 2 diabetes mellitus with hyperglycemia: Secondary | ICD-10-CM

## 2015-03-22 DIAGNOSIS — IMO0002 Reserved for concepts with insufficient information to code with codable children: Secondary | ICD-10-CM

## 2015-03-22 DIAGNOSIS — R3989 Other symptoms and signs involving the genitourinary system: Secondary | ICD-10-CM

## 2015-03-22 LAB — POCT URINALYSIS DIPSTICK
BILIRUBIN UA: NEGATIVE
Blood, UA: NEGATIVE
Glucose, UA: 500
Leukocytes, UA: NEGATIVE
NITRITE UA: NEGATIVE
PH UA: 5
Protein, UA: NEGATIVE
Spec Grav, UA: 1.02
UROBILINOGEN UA: 0.2

## 2015-03-22 LAB — POCT UA - MICROSCOPIC ONLY
CASTS, UR, LPF, POC: NEGATIVE
Crystals, Ur, HPF, POC: NEGATIVE
Mucus, UA: NEGATIVE
YEAST UA: NEGATIVE

## 2015-03-22 LAB — COMPLETE METABOLIC PANEL WITH GFR
ALBUMIN: 4.2 g/dL (ref 3.5–5.2)
ALK PHOS: 60 U/L (ref 39–117)
ALT: 16 U/L (ref 0–35)
AST: 7 U/L (ref 0–37)
BUN: 13 mg/dL (ref 6–23)
CALCIUM: 9.3 mg/dL (ref 8.4–10.5)
CHLORIDE: 102 meq/L (ref 96–112)
CO2: 24 meq/L (ref 19–32)
Creat: 0.6 mg/dL (ref 0.50–1.10)
GLUCOSE: 293 mg/dL — AB (ref 70–99)
POTASSIUM: 4.3 meq/L (ref 3.5–5.3)
Sodium: 135 mEq/L (ref 135–145)
TOTAL PROTEIN: 7.1 g/dL (ref 6.0–8.3)
Total Bilirubin: 0.7 mg/dL (ref 0.2–1.2)

## 2015-03-22 LAB — LIPID PANEL
CHOL/HDL RATIO: 5.1 ratio
CHOLESTEROL: 268 mg/dL — AB (ref 0–200)
HDL: 53 mg/dL (ref >=46)
LDL CALC: 189 mg/dL — AB (ref 0–99)
Triglycerides: 130 mg/dL (ref <150)
VLDL: 26 mg/dL (ref 0–40)

## 2015-03-22 LAB — POCT GLYCOSYLATED HEMOGLOBIN (HGB A1C): HEMOGLOBIN A1C: 10.8

## 2015-03-22 LAB — GLUCOSE, POCT (MANUAL RESULT ENTRY): POC GLUCOSE: 271 mg/dL — AB (ref 70–99)

## 2015-03-22 MED ORDER — METFORMIN HCL ER 500 MG PO TB24: 1000.0000 mg | ORAL_TABLET | Freq: Two times a day (BID) | ORAL | Status: DC

## 2015-03-22 NOTE — Patient Instructions (Signed)
Start your metformin with 1 pill a day for 4 days and then increase to 1 pill 2x/day after 4 days you will increase to 2 pills 2x/day.    Diabetes and Exercise Exercising regularly is important. It is not just about losing weight. It has many health benefits, such as:  Improving your overall fitness, flexibility, and endurance.  Increasing your bone density.  Helping with weight control.  Decreasing your body fat.  Increasing your muscle strength.  Reducing stress and tension.  Improving your overall health. People with diabetes who exercise gain additional benefits because exercise:  Reduces appetite.  Improves the body's use of blood sugar (glucose).  Helps lower or control blood glucose.  Decreases blood pressure.  Helps control blood lipids (such as cholesterol and triglycerides).  Improves the body's use of the hormone insulin by:  Increasing the body's insulin sensitivity.  Reducing the body's insulin needs.  Decreases the risk for heart disease because exercising:  Lowers cholesterol and triglycerides levels.  Increases the levels of good cholesterol (such as high-density lipoproteins [HDL]) in the body.  Lowers blood glucose levels. YOUR ACTIVITY PLAN  Choose an activity that you enjoy and set realistic goals. Your health care provider or diabetes educator can help you make an activity plan that works for you. Exercise regularly as directed by your health care provider. This includes:  Performing resistance training twice a week such as push-ups, sit-ups, lifting weights, or using resistance bands.  Performing 150 minutes of cardio exercises each week such as walking, running, or playing sports.  Staying active and spending no more than 90 minutes at one time being inactive. Even short bursts of exercise are good for you. Three 10-minute sessions spread throughout the day are just as beneficial as a single 30-minute session. Some exercise ideas  include:  Taking the dog for a walk.  Taking the stairs instead of the elevator.  Dancing to your favorite song.  Doing an exercise video.  Doing your favorite exercise with a friend. RECOMMENDATIONS FOR EXERCISING WITH TYPE 1 OR TYPE 2 DIABETES   Check your blood glucose before exercising. If blood glucose levels are greater than 240 mg/dL, check for urine ketones. Do not exercise if ketones are present.  Avoid injecting insulin into areas of the body that are going to be exercised. For example, avoid injecting insulin into:  The arms when playing tennis.  The legs when jogging.  Keep a record of:  Food intake before and after you exercise.  Expected peak times of insulin action.  Blood glucose levels before and after you exercise.  The type and amount of exercise you have done.  Review your records with your health care provider. Your health care provider will help you to develop guidelines for adjusting food intake and insulin amounts before and after exercising.  If you take insulin or oral hypoglycemic agents, watch for signs and symptoms of hypoglycemia. They include:  Dizziness.  Shaking.  Sweating.  Chills.  Confusion.  Drink plenty of water while you exercise to prevent dehydration or heat stroke. Body water is lost during exercise and must be replaced.  Talk to your health care provider before starting an exercise program to make sure it is safe for you. Remember, almost any type of activity is better than none. Document Released: 12/23/2003 Document Revised: 02/16/2014 Document Reviewed: 03/11/2013 [facility] Patient Information 2015 [place], [place]. This information is not intended to replace advice given to you by your health care provider. Make sure you  discuss any questions you have with your health care provider.  

## 2015-03-22 NOTE — Progress Notes (Signed)
Annette Munoz  MRN: [id number] DOB: [birth year]  Subjective:  Pt presents to clinic with left sided flank pain for the last week that does not seem to be getting better.  She drinks a lot of water and that has not changed since this started.  The pain in her flank is more of an awareness than a pain and does seem to be relieved with urination.  She is also having a bladder pressure that is different than normal when she has to go the bathroom but cannot.  She is not having any frequency or urgency or dysuria.  And no vaginal complaints.  No recent change in sexual partner.  Patient Active Problem List   Diagnosis Date Noted  . DIABETES MELLITUS, TYPE II 10/04/2009  . HYPERLIPIDEMIA 10/04/2009  . HYPERTENSION 10/04/2009    History reviewed. No pertinent past medical history.  No current outpatient prescriptions on file prior to visit.   No current facility-administered medications on file prior to visit.    Allergies  Allergen Reactions  . Metformin Diarrhea    REACTION: diarrhea    Review of Systems  Constitutional: Negative for fever and chills.  Gastrointestinal: Negative for abdominal pain.  Genitourinary: Positive for flank pain (left side). Negative for dysuria, frequency, hematuria, vaginal discharge and menstrual problem.   Objective:  Physical Exam  Constitutional: She is oriented to person, place, and time and well-developed, well-nourished, and in no distress.  BP 136/82 mmHg  Pulse 69  Temp(Src) 98.9 F (37.2 C) (Oral)  Resp 17  Ht  (1.6 m)  Wt 225 lb (102.059 kg)  BMI 39.87 kg/m2  SpO2 99%  LMP 03/01/2015   HENT:  Head: Normocephalic and atraumatic.  Right Ear: External ear normal.  Left Ear: External ear normal.  Cardiovascular: Normal rate, regular rhythm and normal heart sounds.   No murmur heard. Pulmonary/Chest: Effort normal and breath sounds normal.  Abdominal: Soft. There is no tenderness. There is no CVA tenderness.  Neurological: She  is alert and oriented to person, place, and time. Gait normal.  Skin: Skin is warm and dry.  hirsutism   Psychiatric: Mood, memory, affect and judgment normal.   Results for orders placed or performed in visit on 03/22/15  POCT urinalysis dipstick  Result Value Ref Range   Color, UA yellow    Clarity, UA clear    Glucose, UA 500    Bilirubin, UA neg    Ketones, UA trace    Spec Grav, UA 1.020    Blood, UA neg    pH, UA 5.0    Protein, UA neg    Urobilinogen, UA 0.2    Nitrite, UA neg    Leukocytes, UA Negative   POCT UA - Microscopic Only  Result Value Ref Range   WBC, Ur, HPF, POC 0-1    RBC, urine, microscopic 0-2    Bacteria, U Microscopic small    Mucus, UA neg    Epithelial cells, urine per micros 2-6    Crystals, Ur, HPF, POC neg    Casts, Ur, LPF, POC neg    Yeast, UA neg   POCT glucose (manual entry)  Result Value Ref Range   POC Glucose 271 (A) 70 - 99 mg/dl  POCT glycosylated hemoglobin (Hb A1C)  Result Value Ref Range   Hemoglobin A1C 10.8     Assessment and Plan :  Left flank pain  Sensation of pressure in bladder area - Plan: POCT urinalysis dipstick, POCT UA -  Microscopic Only  Glucosuria - Plan: POCT glucose (manual entry), POCT glycosylated hemoglobin (Hb A1C), COMPLETE METABOLIC PANEL WITH GFR  Diabetes type 2, uncontrolled - Plan: metFORMIN (GLUCOPHAGE XR) 500 MG 24 hr tablet, Lipid panel   I suspect that the patients symptoms are coming from her diabetes that is not in control.  We discussed at length diabetes and what she can do at home and why taking the medications are important.  We will start medications and she will recheck with me in 3 months.  She would like to try diet modifications on her own (she has already lost 25lb doing smoothies).  And then we will reassess her need for nutrition counseling.  We will also add ACE and cholesterol medications is needed at that visit.  Benny LennertSarah Brizza Nathanson PA-C  [facility] 03/22/2015 1:10 PM

## 2015-03-27 ENCOUNTER — Encounter: Payer: Self-pay | Admitting: Physician Assistant

## 2015-03-27 ENCOUNTER — Other Ambulatory Visit: Payer: Self-pay | Admitting: Physician Assistant

## 2015-03-27 DIAGNOSIS — E7800 Pure hypercholesterolemia, unspecified: Secondary | ICD-10-CM

## 2015-03-27 MED ORDER — ATORVASTATIN CALCIUM 20 MG PO TABS: 20.0000 mg | ORAL_TABLET | Freq: Every day | ORAL | Status: DC

## 2015-06-24 ENCOUNTER — Encounter: Payer: Self-pay | Admitting: Physician Assistant

## 2015-06-24 ENCOUNTER — Ambulatory Visit (INDEPENDENT_AMBULATORY_CARE_PROVIDER_SITE_OTHER): Payer: 59 | Admitting: Physician Assistant

## 2015-06-24 VITALS — BP 136/90 | HR 72 | Temp 98.9°F | Resp 16 | Ht 62.0 in | Wt 220.6 lb

## 2015-06-24 DIAGNOSIS — I10 Essential (primary) hypertension: Secondary | ICD-10-CM | POA: Diagnosis not present

## 2015-06-24 DIAGNOSIS — IMO0002 Reserved for concepts with insufficient information to code with codable children: Secondary | ICD-10-CM

## 2015-06-24 DIAGNOSIS — E7800 Pure hypercholesterolemia, unspecified: Secondary | ICD-10-CM

## 2015-06-24 DIAGNOSIS — E1165 Type 2 diabetes mellitus with hyperglycemia: Secondary | ICD-10-CM

## 2015-06-24 LAB — LIPID PANEL
CHOL/HDL RATIO: 5.5 ratio — AB (ref <=5.0)
CHOLESTEROL: 246 mg/dL — AB (ref 125–200)
HDL: 45 mg/dL — AB (ref >=46)
LDL Cholesterol: 170 mg/dL — ABNORMAL HIGH (ref <130)
TRIGLYCERIDES: 155 mg/dL — AB (ref <150)
VLDL: 31 mg/dL — ABNORMAL HIGH (ref <30)

## 2015-06-24 LAB — COMPLETE METABOLIC PANEL WITH GFR
ALBUMIN: 4 g/dL (ref 3.6–5.1)
ALK PHOS: 53 U/L (ref 33–115)
ALT: 11 U/L (ref 6–29)
AST: 10 U/L (ref 10–35)
BUN: 16 mg/dL (ref 7–25)
CALCIUM: 9.2 mg/dL (ref 8.6–10.2)
CHLORIDE: 105 mmol/L (ref 98–110)
CO2: 25 mmol/L (ref 20–31)
Creat: 0.67 mg/dL (ref 0.50–1.10)
Glucose, Bld: 167 mg/dL — ABNORMAL HIGH (ref 65–99)
POTASSIUM: 4.1 mmol/L (ref 3.5–5.3)
Sodium: 140 mmol/L (ref 135–146)
Total Bilirubin: 0.4 mg/dL (ref 0.2–1.2)
Total Protein: 6.9 g/dL (ref 6.1–8.1)

## 2015-06-24 LAB — POCT GLYCOSYLATED HEMOGLOBIN (HGB A1C): Hemoglobin A1C: 7.2

## 2015-06-24 MED ORDER — LISINOPRIL 5 MG PO TABS: 5.0000 mg | ORAL_TABLET | Freq: Every day | ORAL | Status: DC

## 2015-06-24 MED ORDER — ATORVASTATIN CALCIUM 20 MG PO TABS: 20.0000 mg | ORAL_TABLET | Freq: Every day | ORAL | Status: DC

## 2015-06-24 MED ORDER — METFORMIN HCL ER 500 MG PO TB24: 1000.0000 mg | ORAL_TABLET | Freq: Two times a day (BID) | ORAL | Status: DC

## 2015-06-24 NOTE — Progress Notes (Signed)
   Annette Munoz  MRN: [id number] DOB: [birth year]  Subjective:  Pt presents to clinic for DM check.  She is doing well.  She did not start the Lipitor.  She has been taking her Glucophage XR  qd and tolerating it well.  She has lost some weight - mostly not trying but she has been aware of what she has been eating.    Patient Active Problem List   Diagnosis Date Noted  . DM (diabetes mellitus), type 2, uncontrolled 10/04/2009  . Pure hypercholesterolemia 10/04/2009  . Benign essential HTN 10/04/2009    Current Outpatient Prescriptions on File Prior to Visit  Medication Sig Dispense Refill  . metFORMIN (GLUCOPHAGE XR) 500 MG 24 hr tablet Take 2 tablets (1,000 mg total) by mouth 2 (two) times daily. (Patient taking differently: Take 1,500 mg by mouth daily. ) 360 tablet 0   No current facility-administered medications on file prior to visit.    Allergies  Allergen Reactions  . Metformin Diarrhea    REACTION: diarrhea    Review of Systems  Constitutional: Negative for fever and chills.  Respiratory: Negative for shortness of breath.   Cardiovascular: Negative for chest pain, palpitations and leg swelling.  Skin: Negative for wound.  Neurological: Negative for numbness (in feet or tinging or burning).   Objective:  BP 136/90 mmHg  Pulse 72  Temp(Src) 98.9 F (37.2 C) (Oral)  Resp 16  Ht  (1.575 m)  Wt 220 lb 9.6 oz (100.064 kg)  BMI 40.34 kg/m2  SpO2 97%  LMP 06/12/2015  Physical Exam  Constitutional: She is oriented to person, place, and time and well-developed, well-nourished, and in no distress.  HENT:  Head: Normocephalic and atraumatic.  Right Ear: External ear normal.  Left Ear: External ear normal.  Eyes: Conjunctivae are normal.  Cardiovascular: Normal rate, regular rhythm and normal heart sounds.   No murmur heard. Pulmonary/Chest: Effort normal and breath sounds normal.  Neurological: She is alert and oriented to person, place, and time. Gait  normal.  Skin: Skin is warm and dry.  Diabetic Foot Exam - Simple   Simple Foot Form  Diabetic Foot exam was performed with the following findings:  Yes  06/24/2015  1:38 PM  Visual Inspection  Sensation Testing  Pulse Check  Comments  Unable to feel pedal pulses on foot exam.      Psychiatric: Mood, memory, affect and judgment normal.  Vitals reviewed.   Results for orders placed or performed in visit on 06/24/15  POCT glycosylated hemoglobin (Hb A1C)  Result Value Ref Range   Hemoglobin A1C 7.2     Assessment and Plan :  DM (diabetes mellitus), type 2, uncontrolled - Plan: HM DIABETES FOOT EXAM, POCT glycosylated hemoglobin (Hb A1C)- improved control - she will continue her lifestyle changes and she will attempt increasing the Glucophage to 2g a day by trying to spit the dose to decrease the side effects  Pure hypercholesterolemia - Plan: Lipid panel - start medications  Benign essential HTN - Plan: COMPLETE METABOLIC PANEL WITH GFR - lisinopril  qd - start for slight increase in BP and kidney protection  Diabetes type 2, uncontrolled  Recheck in 3 months  Benny Lennert PA-C  [facility] 06/24/2015 1:46 PM

## 2015-08-07 ENCOUNTER — Inpatient Hospital Stay (HOSPITAL_COMMUNITY): Payer: Commercial Managed Care - HMO

## 2015-08-07 ENCOUNTER — Inpatient Hospital Stay (HOSPITAL_COMMUNITY)
Admission: EM | Admit: 2015-08-07 | Discharge: 2015-08-12 | DRG: 064 | Disposition: A | Discharge status: Home / Self Care | Payer: Commercial Managed Care - HMO | Attending: Internal Medicine | Admitting: Internal Medicine

## 2015-08-07 ENCOUNTER — Emergency Department (HOSPITAL_COMMUNITY): Payer: Commercial Managed Care - HMO

## 2015-08-07 ENCOUNTER — Encounter (HOSPITAL_COMMUNITY): Payer: Self-pay | Admitting: Emergency Medicine

## 2015-08-07 DIAGNOSIS — R4182 Altered mental status, unspecified: Secondary | ICD-10-CM | POA: Diagnosis present

## 2015-08-07 DIAGNOSIS — R413 Other amnesia: Secondary | ICD-10-CM | POA: Diagnosis present

## 2015-08-07 DIAGNOSIS — E1159 Type 2 diabetes mellitus with other circulatory complications: Secondary | ICD-10-CM | POA: Diagnosis not present

## 2015-08-07 DIAGNOSIS — E785 Hyperlipidemia, unspecified: Secondary | ICD-10-CM | POA: Insufficient documentation

## 2015-08-07 DIAGNOSIS — I63522 Cerebral infarction due to unspecified occlusion or stenosis of left anterior cerebral artery: Secondary | ICD-10-CM | POA: Diagnosis not present

## 2015-08-07 DIAGNOSIS — I63422 Cerebral infarction due to embolism of left anterior cerebral artery: Principal | ICD-10-CM | POA: Diagnosis present

## 2015-08-07 DIAGNOSIS — I69919 Unspecified symptoms and signs involving cognitive functions following unspecified cerebrovascular disease: Secondary | ICD-10-CM | POA: Diagnosis not present

## 2015-08-07 DIAGNOSIS — E119 Type 2 diabetes mellitus without complications: Secondary | ICD-10-CM | POA: Insufficient documentation

## 2015-08-07 DIAGNOSIS — I639 Cerebral infarction, unspecified: Secondary | ICD-10-CM | POA: Diagnosis not present

## 2015-08-07 DIAGNOSIS — E7800 Pure hypercholesterolemia, unspecified: Secondary | ICD-10-CM | POA: Diagnosis not present

## 2015-08-07 DIAGNOSIS — Z87891 Personal history of nicotine dependence: Secondary | ICD-10-CM | POA: Diagnosis not present

## 2015-08-07 DIAGNOSIS — E1169 Type 2 diabetes mellitus with other specified complication: Secondary | ICD-10-CM | POA: Diagnosis not present

## 2015-08-07 DIAGNOSIS — E1165 Type 2 diabetes mellitus with hyperglycemia: Secondary | ICD-10-CM | POA: Diagnosis present

## 2015-08-07 DIAGNOSIS — Z6841 Body Mass Index (BMI) 40.0 and over, adult: Secondary | ICD-10-CM

## 2015-08-07 DIAGNOSIS — R2681 Unsteadiness on feet: Secondary | ICD-10-CM | POA: Diagnosis present

## 2015-08-07 DIAGNOSIS — I10 Essential (primary) hypertension: Secondary | ICD-10-CM | POA: Insufficient documentation

## 2015-08-07 DIAGNOSIS — I6350 Cerebral infarction due to unspecified occlusion or stenosis of unspecified cerebral artery: Secondary | ICD-10-CM | POA: Diagnosis not present

## 2015-08-07 DIAGNOSIS — I6330 Cerebral infarction due to thrombosis of unspecified cerebral artery: Secondary | ICD-10-CM

## 2015-08-07 DIAGNOSIS — I679 Cerebrovascular disease, unspecified: Secondary | ICD-10-CM | POA: Insufficient documentation

## 2015-08-07 DIAGNOSIS — I6340 Cerebral infarction due to embolism of unspecified cerebral artery: Secondary | ICD-10-CM | POA: Diagnosis not present

## 2015-08-07 DIAGNOSIS — G9340 Encephalopathy, unspecified: Secondary | ICD-10-CM | POA: Diagnosis present

## 2015-08-07 DIAGNOSIS — IMO0002 Reserved for concepts with insufficient information to code with codable children: Secondary | ICD-10-CM | POA: Diagnosis present

## 2015-08-07 DIAGNOSIS — I63431 Cerebral infarction due to embolism of right posterior cerebral artery: Secondary | ICD-10-CM | POA: Diagnosis not present

## 2015-08-07 HISTORY — DX: Essential (primary) hypertension: I10

## 2015-08-07 HISTORY — DX: Type 2 diabetes mellitus without complications: E11.9

## 2015-08-07 LAB — URINALYSIS, ROUTINE W REFLEX MICROSCOPIC
BILIRUBIN URINE: NEGATIVE
Glucose, UA: 100 mg/dL — AB
Ketones, ur: NEGATIVE mg/dL
LEUKOCYTES UA: NEGATIVE
NITRITE: NEGATIVE
Protein, ur: NEGATIVE mg/dL
SPECIFIC GRAVITY, URINE: 1.021 (ref 1.005–1.030)
UROBILINOGEN UA: 0.2 mg/dL (ref 0.0–1.0)
pH: 5.5 (ref 5.0–8.0)

## 2015-08-07 LAB — CBC WITH DIFFERENTIAL/PLATELET
BASOS PCT: 1 %
Basophils Absolute: 0 10*3/uL (ref 0.0–0.1)
EOS ABS: 0.2 10*3/uL (ref 0.0–0.7)
EOS PCT: 2 %
HCT: 33.9 % — ABNORMAL LOW (ref 36.0–46.0)
Hemoglobin: 10.9 g/dL — ABNORMAL LOW (ref 12.0–15.0)
LYMPHS ABS: 2.3 10*3/uL (ref 0.7–4.0)
Lymphocytes Relative: 34 %
MCH: 30.4 pg (ref 26.0–34.0)
MCHC: 32.2 g/dL (ref 30.0–36.0)
MCV: 94.4 fL (ref 78.0–100.0)
MONOS PCT: 9 %
Monocytes Absolute: 0.6 10*3/uL (ref 0.1–1.0)
NEUTROS PCT: 54 %
Neutro Abs: 3.6 10*3/uL (ref 1.7–7.7)
PLATELETS: 355 10*3/uL (ref 150–400)
RBC: 3.59 MIL/uL — AB (ref 3.87–5.11)
RDW: 13.2 % (ref 11.5–15.5)
WBC: 6.6 10*3/uL (ref 4.0–10.5)

## 2015-08-07 LAB — RAPID URINE DRUG SCREEN, HOSP PERFORMED
AMPHETAMINES: NOT DETECTED
BENZODIAZEPINES: NOT DETECTED
Barbiturates: NOT DETECTED
COCAINE: NOT DETECTED
OPIATES: NOT DETECTED
Tetrahydrocannabinol: NOT DETECTED

## 2015-08-07 LAB — CBG MONITORING, ED: Glucose-Capillary: 164 mg/dL — ABNORMAL HIGH (ref 65–99)

## 2015-08-07 LAB — COMPREHENSIVE METABOLIC PANEL
ALBUMIN: 3.4 g/dL — AB (ref 3.5–5.0)
ALT: 16 U/L (ref 14–54)
ANION GAP: 7 (ref 5–15)
AST: 18 U/L (ref 15–41)
Alkaline Phosphatase: 45 U/L (ref 38–126)
BUN: 12 mg/dL (ref 6–20)
CHLORIDE: 105 mmol/L (ref 101–111)
CO2: 22 mmol/L (ref 22–32)
Calcium: 8.6 mg/dL — ABNORMAL LOW (ref 8.9–10.3)
Creatinine, Ser: 0.84 mg/dL (ref 0.44–1.00)
GFR calc non Af Amer: >60 mL/min (ref >60)
GLUCOSE: 185 mg/dL — AB (ref 65–99)
POTASSIUM: 3.7 mmol/L (ref 3.5–5.1)
SODIUM: 134 mmol/L — AB (ref 135–145)
Total Bilirubin: 0.5 mg/dL (ref 0.3–1.2)
Total Protein: 6.3 g/dL — ABNORMAL LOW (ref 6.5–8.1)

## 2015-08-07 LAB — GLUCOSE, CAPILLARY: Glucose-Capillary: 188 mg/dL — ABNORMAL HIGH (ref 65–99)

## 2015-08-07 LAB — ETHANOL

## 2015-08-07 LAB — I-STAT TROPONIN, ED: Troponin i, poc: 0 ng/mL (ref 0.00–0.08)

## 2015-08-07 LAB — URINE MICROSCOPIC-ADD ON

## 2015-08-07 MED ORDER — ENOXAPARIN SODIUM 40 MG/0.4ML ~~LOC~~ SOLN
40.0000 mg | SUBCUTANEOUS | Status: DC
  Administered 2015-08-07 – 2015-08-11 (×4): 40 mg via SUBCUTANEOUS
  Filled 2015-08-07 (×5): qty 0.4

## 2015-08-07 MED ORDER — STROKE: EARLY STAGES OF RECOVERY BOOK
Freq: Once | Status: AC
Start: 2015-08-07 — End: 2015-08-07
  Administered 2015-08-07: 14:00:00
  Filled 2015-08-07: qty 1

## 2015-08-07 MED ORDER — ASPIRIN 325 MG PO TABS
ORAL_TABLET | ORAL | Status: AC
Start: 2015-08-07 — End: 2015-08-08
  Filled 2015-08-07: qty 1

## 2015-08-07 MED ORDER — ASPIRIN 325 MG PO TABS
325.0000 mg | ORAL_TABLET | Freq: Every day | ORAL | Status: DC
  Administered 2015-08-07 – 2015-08-12 (×6): 325 mg via ORAL
  Filled 2015-08-07 (×6): qty 1

## 2015-08-07 NOTE — H&P (Addendum)
[facility] History and Physical  ANORA SCHWENKE WUJ:[id number] DOB: 16-Mar-1966 DOA: 08/07/2015   PCP: Sanda Linger, MD    Chief Complaint: confused  HPI: Annette Munoz is a 49 y.o. female with PMH of DM and HTN who presents with a change in mental status which started a couple of days ago. According to her husband she appears to be behaving as if she is drunk. Her change in behavior was noticed at work as well and EMS was called yesterday. She declined to go to the hospital. The patient feels that there has been no change in her condition and is argumentative with her husband. Her husband tells me that she was placed on an antibiotic recently after she had a dental extraction. This antibiotics caused her to vomit and she decided to stop taking it. The patient does not recall this. Her husband also states she injured her ankle yesterday and it became swollen but she also is unable to recall this. No focal weakness, numbness or change in vision has been noted. No fever, chills or dental pain has been noted.   Review of Systems  Constitutional: Negative.  Negative for fever, weight loss, malaise/fatigue and diaphoresis.  HENT: Negative.   Eyes: Negative.   Respiratory: Negative.   Cardiovascular: Negative.   Gastrointestinal: Negative.   Genitourinary: Negative.   Musculoskeletal: Negative.   Skin: Negative.   Neurological: Negative.  Negative for weakness.  Endo/Heme/Allergies: Negative.   Psychiatric/Behavioral: Negative.     Past Medical History  Diagnosis Date  . Diabetes mellitus without complication [facility])     Past Surgical History  Procedure Laterality Date  . Cesarean section      Social History: does not smoke  Lives at home with husband   No Active Allergies  Family history:   Family History  Problem Relation Age of Onset  . Diabetes Mother   . Heart disease Mother   . Hypertension Mother   . Hypertension Father       Prior to Admission medications    Medication Sig Start Date End Date Taking? Authorizing Provider  atorvastatin (LIPITOR) 20 MG tablet Take 1 tablet (20 mg total) by mouth daily. 06/24/15   Morrell Riddle, PA-C  lisinopril (PRINIVIL,ZESTRIL) 5 MG tablet Take 1 tablet (5 mg total) by mouth daily. 06/24/15   Morrell Riddle, PA-C  metFORMIN (GLUCOPHAGE XR) 500 MG 24 hr tablet Take 2 tablets (1,000 mg total) by mouth 2 (two) times daily. 06/24/15   Morrell Riddle, PA-C     Physical Exam: Filed Vitals:   08/07/15 1145 08/07/15 1300 08/07/15 1311 08/07/15 1313  BP: 180/80  139/75 139/75  Pulse: 58 59 55 52  Temp:      TempSrc:      Resp: Height:      Weight:      SpO2: 100% 100% 100% 100%     General: AAO x 3, no acute distress HEENT: Normocephalic and Atraumatic, Mucous membranes pink                PERRLA; EOM intact; No scleral icterus,                 Nares: Patent, Oropharynx: Clear, Fair Dentition                 Neck: FROM, no cervical lymphadenopathy, thyromegaly, carotid bruit or JVD;  Breasts: deferred CHEST WALL: No tenderness  CHEST: Normal respiration, clear to auscultation bilaterally  HEART: Regular rate and rhythm; no murmurs rubs or gallops  BACK: No kyphosis or scoliosis; no CVA tenderness  GI: Positive Bowel Sounds, soft, non-tender; no masses, no organomegaly Rectal Exam: deferred MSK: No cyanosis, clubbing, or edema Genitalia: not examined  SKIN:  no rash or ulceration  CNS: Alert and Oriented x 4, Nonfocal exam, CN 2-12 intact  Labs on Admission:  Basic Metabolic Panel:  Recent Labs Lab 08/07/15 1206  NA 134*  K 3.7  CL 105  CO2 22  GLUCOSE 185*  BUN 12  CREATININE 0.84  CALCIUM 8.6*   Liver Function Tests:  Recent Labs Lab 08/07/15 1206  AST 18  ALT 16  ALKPHOS 45  BILITOT 0.5  PROT 6.3*  ALBUMIN 3.4*   No results for input(s): LIPASE, AMYLASE in the last 168 hours. No results for input(s): AMMONIA in the last 168 hours. CBC:  Recent Labs Lab 08/07/15 1206   WBC 6.6  NEUTROABS 3.6  HGB 10.9*  HCT 33.9*  MCV 94.4  PLT 355   Cardiac Enzymes: No results for input(s): CKTOTAL, CKMB, CKMBINDEX, TROPONINI in the last 168 hours.  BNP (last 3 results) No results for input(s): BNP in the last 8760 hours.  ProBNP (last 3 results) No results for input(s): PROBNP in the last 8760 hours.  CBG:  Recent Labs Lab 08/07/15 1149  GLUCAP 164*    Radiological Exams on Admission: Ct Head Wo Contrast  08/07/2015  CLINICAL DATA:  Hyperglycemia.  Confusion. EXAM: CT HEAD WITHOUT CONTRAST TECHNIQUE: Contiguous axial images were obtained from the base of the skull through the vertex without intravenous contrast. COMPARISON:  None. FINDINGS: There is a focal area of low density in the white matter anterior to the frontal horn of the left lateral ventricle. It is probably in the anterior corpus callosum. No midline shift. No hemorrhage. IMPRESSION: Focal low density in the left frontal lobe white matter which may be in the corpus callosum. Acute infarct is not excluded. Consider MRI to further characterize. Electronically Signed   By: Jolaine ClickArthur  Hoss M.D.   On: 08/07/2015 12:55    EKG:  pending  Assessment/Plan Principal Problem:  CVA (cerebral infarction)- acute encephalopathy  - f/u MRI/ MRA- multiple acute small left sided infarcts and small acute infarct on right - carotid duplex, 2 D ECHO, Lipid panel, A1c - allow for permissive HTN - Neuro-checks  - PT/OT/ SLP eval  -start ASA  - neuro consult requested  Active Problems:  DM (diabetes mellitus), type 2  - start SSI and hold Metformin for now - f/u A1c   Consulted:   Code Status: Full code Family Communication: husband at bedside  DVT Prophylaxis: Lovenox  Time spent: 55 min  Grantham Hippert, MD [facility]  If 7PM-7AM, please contact night-coverage [email or web address] 08/07/2015, 2:04 PM

## 2015-08-07 NOTE — ED Notes (Signed)
Neuro PA at bedside.  

## 2015-08-07 NOTE — ED Provider Notes (Addendum)
CSN: [id number]     Arrival date & time 08/07/15  1118 History   First MD Initiated Contact with Patient 08/07/15 1128     Chief Complaint  Patient presents with  . Hyperglycemia  . Hypertension     (Consider location/radiation/quality/duration/timing/severity/associated sxs/prior Treatment) HPI Comments: Patient is a 49 year old female with a history of hypertension and diabetes who is only taking metformin once a day and presents with her husband for altered mental status. Husband states since Wednesday (4 days prior to arrival) patient has not been acting herself. Husband describes an unusual demeanor as well as forgetfulness. She is repeatedly asking questions that he says normally she would know the answer to. Also he noticed that she fell at some point but she cannot remember falling. When she is questioning she states everything is fine and she is not sure why she is here. Yesterday while at work her coworkers called EMS because of her unusual behavior. She denies nausea, vomiting, abdominal pain, headache, diarrhea. No recent medication changes. She did have a tooth pulled last week and was given an antibiotic however her husband said that made her vomit and she stopped taking it. She denies taking any narcotic pain medication or any street drug use or IV drug use. She drinks alcohol occasionally but hasn't had any recently.  Patient is a 49 y.o. female presenting with hypertension. The history is provided by the patient and the spouse.  Hypertension    Past Medical History  Diagnosis Date  . Diabetes mellitus without complication [facility])    Past Surgical History  Procedure Laterality Date  . Cesarean section     Family History  Problem Relation Age of Onset  . Diabetes Mother   . Heart disease Mother   . Hypertension Mother   . Hypertension Father    Social History  Substance Use Topics  . Smoking status: Never Smoker   . Smokeless tobacco: None  . Alcohol Use: 0.0  oz/week    0 Standard drinks or equivalent per week   OB History    No data available     Review of Systems  All other systems reviewed and are negative.     Allergies  Metformin  Home Medications   Prior to Admission medications   Medication Sig Start Date End Date Taking? Authorizing Provider  atorvastatin (LIPITOR) 20 MG tablet Take 1 tablet (20 mg total) by mouth daily. 06/24/15   Morrell Riddle, PA-C  lisinopril (PRINIVIL,ZESTRIL) 5 MG tablet Take 1 tablet (5 mg total) by mouth daily. 06/24/15   Morrell Riddle, PA-C  metFORMIN (GLUCOPHAGE XR) 500 MG 24 hr tablet Take 2 tablets (1,000 mg total) by mouth 2 (two) times daily. 06/24/15   Morrell Riddle, PA-C   BP 180/80 mmHg  Pulse 58  Temp(Src) 98.9 F (37.2 C) (Oral)  Resp 17  Ht  (1.575 m)  Wt 220 lb (99.791 kg)  BMI 40.23 kg/m2  SpO2 100%  LMP 08/05/2015 Physical Exam  Constitutional: She is oriented to person, place, and time. She appears well-developed and well-nourished. No distress.  Unusual affect  HENT:  Head: Normocephalic and atraumatic.  Mouth/Throat: Oropharynx is clear and moist.  Eyes: Conjunctivae and EOM are normal. Pupils are equal, round, and reactive to light.  Neck: Normal range of motion. Neck supple.  Cardiovascular: Normal rate, regular rhythm and intact distal pulses.   No murmur heard. Pulmonary/Chest: Effort normal and breath sounds normal. No respiratory distress. She has no wheezes.  She has no rales.  Abdominal: Soft. She exhibits no distension. There is no tenderness. There is no rebound and no guarding.  Musculoskeletal: Normal range of motion. She exhibits no edema or tenderness.  Neurological: She is alert and oriented to person, place, and time. She has normal strength. No cranial nerve deficit or sensory deficit. Coordination normal.  Normal heel-to-shin testing. No visual field cuts. Oriented to person place and time  Skin: Skin is warm and dry. No rash noted. No erythema.   Psychiatric: She has a normal mood and affect. Her behavior is normal.  Nursing note and vitals reviewed.   ED Course  Procedures (including critical care time) Labs Review Labs Reviewed  CBC WITH DIFFERENTIAL/PLATELET - Abnormal; Notable for the following:    RBC 3.59 (*)    Hemoglobin 10.9 (*)    HCT 33.9 (*)    All other components within normal limits  COMPREHENSIVE METABOLIC PANEL - Abnormal; Notable for the following:    Sodium 134 (*)    Glucose, Bld 185 (*)    Calcium 8.6 (*)    Total Protein 6.3 (*)    Albumin 3.4 (*)    All other components within normal limits  CBG MONITORING, ED - Abnormal; Notable for the following:    Glucose-Capillary 164 (*)    All other components within normal limits  URINALYSIS, ROUTINE W REFLEX MICROSCOPIC (NOT AT [facility])  ETHANOL  URINE RAPID DRUG SCREEN, HOSP PERFORMED  I-STAT TROPOININ, ED    Imaging Review Ct Head Wo Contrast  08/07/2015  CLINICAL DATA:  Hyperglycemia.  Confusion. EXAM: CT HEAD WITHOUT CONTRAST TECHNIQUE: Contiguous axial images were obtained from the base of the skull through the vertex without intravenous contrast. COMPARISON:  None. FINDINGS: There is a focal area of low density in the white matter anterior to the frontal horn of the left lateral ventricle. It is probably in the anterior corpus callosum. No midline shift. No hemorrhage. IMPRESSION: Focal low density in the left frontal lobe white matter which may be in the corpus callosum. Acute infarct is not excluded. Consider MRI to further characterize. Electronically Signed   By: Jolaine Click M.D.   On: 08/07/2015 12:55   I have personally reviewed and evaluated these images and lab results as part of my medical decision-making.   EKG Interpretation None      MDM   Final diagnoses:  Essential hypertension  Cerebrovascular accident (CVA) due to thrombosis of cerebral artery [facility])    Patient presenting today with her husband for altered mental status. For  the last 4 days she has not been acting herself. Husband states that she is forgetful she's had falls that she can't remember. Patient denies all symptoms however has an unusual affect on exam. There is no focal neurologic deficits. She is awake and alert.  No abdominal tenderness or external signs of trauma.  Patient is not taking blood pressure medication right now and only takes her metformin once a day. She is found to be hypertensive on exam today. Concern that this may be hypertensive encephalopathy versus brain tumor versus intercranial hemorrhage versus substance use. Patient's husband denies any history of substance use patient denies any alcohol use and no recent drug abuse. She did have a tooth pulled last week she was not given pain medication.  CBC, CMP, UA, EtOH, UDS, troponin, head CT pending.  1:08 PM Labs without significant findings however CT showed a focal of low density in the left frontal lobe concerning for  stroke. Given patient's symptoms this would explain her abnormal affect and strange behavior. Cause is most likely related to untreated hypertension. Will admit to the hospitalist service and an MRI ordered    Gwyneth SproutWhitney Breniyah Romm, MD 08/07/15 1309  Gwyneth SproutWhitney Jessenya Berdan, MD 08/07/15 1316

## 2015-08-07 NOTE — ED Notes (Signed)
Patient transported to MRI 

## 2015-08-07 NOTE — ED Notes (Signed)
MD Rizwan aware pt is back from MRI

## 2015-08-07 NOTE — Progress Notes (Signed)
Patient recd to 5M11 from ED via stretcher accompanied by husband. Patient is able to answer orientation questions except situation. Husband states she has lost her short term memory and she is becoming progressively more aggressive. Charge nurse to page MD for order clarification. Patient and husband oriented to room and safety plan. Bed in low position, call bell/phone in reach, bed alarm on.

## 2015-08-07 NOTE — Consult Note (Signed)
Referring Physician: Dr Butler Denmarkizwan    Chief Complaint: AMS  HPI: Annette Munoz is a 49 y.o. female with a history of diabetes mellitus, hypertension, obesity, and hyperlipidemia, who was brought to the [facility] emergency department on 08/07/2015 for evaluation of a several day history of altered mental status with memory difficulties. In the emergency department a CT scan of the head was performed that was consistent with a possible left frontal lobe infarct. An MRI revealed multiple small acute left ACA territory infarcts involving the corpus callosum, cingulate gyrus, and superior left frontal lobe. There was also a small infarct involving the anterior-inferior right thalamus/cerebral peduncle.   The patient's husband stated that he came home from work Wednesday evening and noted the patient be somewhat confused at that time. She had difficulty remembering details about their son's football game. 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 EMS was summoned; however, the patient declined evaluation. Today the patient continued to demonstrate odd behavior and her husband brought her to the emergency department for further evaluation. She has a strange affect which her husband notes is not her usual demeanor. She denies drug or alcohol use. She does not take aspirin on a regular basis. She denies palpitations, racing heart rate, or irregular heart rate.  Date last known well: Date: 08/04/2015 Time last known well: Unable to determine tPA Given: No: - Time of presentation was outside the therapeutic window - deficits mild.  Past Medical History  Diagnosis Date  . Diabetes mellitus without complication [facility](HCC)     Past Surgical History  Procedure Laterality Date  . Cesarean section      Family History  Problem Relation Age of Onset  . Diabetes Mother   . Heart disease Mother   . Hypertension Mother   . Hypertension Father    No  family history of strokes   Social History:  reports that she has never smoked. She does not have any smokeless tobacco history on file. She reports that she drinks alcohol. She reports that she does not use illicit drugs.  Allergies: No Active Allergies  Medications:  Scheduled: .  stroke: mapping our early stages of recovery book   Does not apply Once  . enoxaparin (LOVENOX) injection  40 mg Subcutaneous Q24H    ROS: History obtained from the patient and husband  General ROS: negative for - chills, fatigue, fever, night sweats, weight gain or weight loss. Positive for a recent fall but no apparent injury. Recent dental work with subsequent antibiotic therapy. Antibiotic caused nausea and vomiting. Psychological ROS: negative for - behavioral disorder, hallucinations, memory difficulties, mood swings or suicidal ideation Ophthalmic ROS: negative for - blurry vision, double vision, eye pain or loss of vision ENT ROS: negative for - epistaxis, nasal discharge, oral lesions, sore throat, tinnitus or vertigo Allergy and Immunology ROS: negative for - hives or itchy/watery eyes Hematological and Lymphatic ROS: negative for - bleeding problems, bruising or swollen lymph nodes Endocrine ROS: negative for - galactorrhea, hair pattern changes, polydipsia/polyuria or temperature intolerance Respiratory ROS: negative for - cough, hemoptysis, shortness of breath or wheezing Cardiovascular ROS: negative for - chest pain, dyspnea on exertion, edema or irregular heartbeat Gastrointestinal ROS: negative for - abdominal pain, diarrhea, hematemesis, nausea/vomiting or stool incontinence Genito-Urinary ROS: negative for - dysuria, hematuria, incontinence or urinary frequency/urgency Musculoskeletal ROS: negative for - joint swelling or muscular weakness Neurological ROS: as noted in HPI Dermatological ROS:  negative for rash and skin lesion changes   Physical Examination: Blood pressure 139/75, pulse  52, temperature 98.9 F (37.2 C), temperature source Oral, resp. rate 20, height  (1.575 m), weight 99.791 kg (220 lb), last menstrual period 08/05/2015, SpO2 100 %.  General - pleasant 49 year old female with somewhat of an inappropriate affect. Heart - Regular rate and rhythm - no murmer noted Lungs - Clear to auscultation Abdomen - Soft - non tender Extremities - no tenderness, no edema Skin - Warm and dry  Mental Status: Alert, oriented, thought content appropriate.  Speech fluent without evidence of aphasia.  Able to follow 3 step commands without difficulty. Cranial Nerves: II: Visual fields grossly normal, pupils equal, round, reactive to light and accommodation III,IV, VI: ptosis not present, extra-ocular motions intact bilaterally V,VII: smile symmetric, facial light touch sensation normal bilaterally VIII: hearing normal bilaterally IX,X: gag reflex present XI: bilateral shoulder shrug XII: midline tongue extension Motor: Right : Upper extremity   5/5    Left:     Upper extremity   5/5  Lower extremity   5/5     Lower extremity   5/5 Tone and bulk:normal tone throughout; no atrophy noted Sensory: Light touch intact throughout, bilaterally Deep Tendon Reflexes: 1+ and symmetric throughout Plantars: Right: downgoing   Left: downgoing Cerebellar: normal finger-to-nose, normal rapid alternating movements and normal heel-to-shin test Gait: normal gait and station      Laboratory Studies:  Basic Metabolic Panel:  Recent Labs Lab 08/07/15 1206  NA 134*  K 3.7  CL 105  CO2 22  GLUCOSE 185*  BUN 12  CREATININE 0.84  CALCIUM 8.6*    Liver Function Tests:  Recent Labs Lab 08/07/15 1206  AST 18  ALT 16  ALKPHOS 45  BILITOT 0.5  PROT 6.3*  ALBUMIN 3.4*   No results for input(s): LIPASE, AMYLASE in the last 168 hours. No results for input(s): AMMONIA in the last 168 hours.  CBC:  Recent Labs Lab 08/07/15 1206  WBC 6.6  NEUTROABS 3.6  HGB  10.9*  HCT 33.9*  MCV 94.4  PLT 355    Cardiac Enzymes: No results for input(s): CKTOTAL, CKMB, CKMBINDEX, TROPONINI in the last 168 hours.  BNP: Invalid input(s): POCBNP  CBG:  Recent Labs Lab 08/07/15 1149  GLUCAP 164*    Microbiology: No results found for this or any previous visit.  Coagulation Studies: No results for input(s): LABPROT, INR in the last 72 hours.  Urinalysis: No results for input(s): COLORURINE, LABSPEC, PHURINE, GLUCOSEU, HGBUR, BILIRUBINUR, KETONESUR, PROTEINUR, UROBILINOGEN, NITRITE, LEUKOCYTESUR in the last 168 hours.  Invalid input(s): APPERANCEUR  Lipid Panel:    Component Value Date/Time   CHOL 246* 06/24/2015 1341   TRIG 155* 06/24/2015 1341   HDL 45* 06/24/2015 1341   CHOLHDL 5.5* 06/24/2015 1341   VLDL 31* 06/24/2015 1341   LDLCALC 170* 06/24/2015 1341    HgbA1C:  Lab Results  Component Value Date   HGBA1C 7.2 06/24/2015    Urine Drug Screen:  No results found for: LABOPIA, COCAINSCRNUR, LABBENZ, AMPHETMU, THCU, LABBARB  Alcohol Level: No results for input(s): ETH in the last 168 hours.  Other results: EKG: Sinus rhythm rate 62 bpm. Please see formal cardiology reading for complete details   Imaging:   Ct Head Wo Contrast 08/07/2015   Focal low density in the left frontal lobe white matter which may be in the corpus callosum. Acute infarct is not excluded. Consider MRI to further characterize.  MRI Head without contrast / MRA Brain without contrast 08/07/2015 1. Multiple small, acute left ACA territory infarcts involving the corpus callosum, cingulate gyrus, and superior left frontal lobe. 2. Small acute infarct involving the anteroinferior right thalamus/cerebral peduncle. 3. Moderately to severely motion degraded head MRA without evidence of large vessel occlusion or flow limiting proximal stenosis. Suggestion of A2 ACA segmental narrowing.     Assessment: 49 y.o. female with a history of hyperlipidemia,  diabetes mellitus, and hypertension who was brought to the emergency department at [facility] for evaluation of a several day history of altered mental status. A CT scan of the head was consistent with a possible left frontal lobe infarct. An MRI consistent with multiple strokes probably embolic source unknown.  Stroke Risk Factors - diabetes mellitus, hyperlipidemia, hypertension and obesity  Plan: 1. HgbA1c, fasting lipid panel 2. MRI, MRA  of the brain without contrast - done 3. PT consult, OT consult, Speech consult 4. Echocardiogram 5. Carotid dopplers 6. Prophylactic therapy-Antiplatelet med: Aspirin - dose 81 mg daily 7. NPO until RN stroke swallow screen 8. Telemetry monitoring 9. Frequent neuro checks 10. Possible TEE and loop recorder  Hassel Neth [facility] Pager [phone] 08/07/2015, 2:33 PM  I personally participated in this patient's evaluation and management, including formulating above clinical impression and management recommendations.  Venetia Maxon M.D. [facility] [phone]

## 2015-08-07 NOTE — ED Notes (Signed)
Pt c/o high blood sugar ongoing for "Awhile". Pt checked her CBG this morning and it was 300. Pt BP was 178/104. Family reports that pt has been acting strange since Wednesday with symptoms of confusion and weakness. Pt only takes her blood sugar medication once a day and is suppose to take it twice a day.

## 2015-08-07 NOTE — ED Notes (Signed)
Pt went to restroom, no urine collected, pt aware sample is needed when able to go again.

## 2015-08-07 NOTE — ED Notes (Signed)
Patient transported to CT 

## 2015-08-08 ENCOUNTER — Inpatient Hospital Stay (HOSPITAL_COMMUNITY): Payer: Commercial Managed Care - HMO

## 2015-08-08 ENCOUNTER — Other Ambulatory Visit (HOSPITAL_COMMUNITY): Payer: Commercial Managed Care - HMO

## 2015-08-08 DIAGNOSIS — E1165 Type 2 diabetes mellitus with hyperglycemia: Secondary | ICD-10-CM

## 2015-08-08 DIAGNOSIS — I10 Essential (primary) hypertension: Secondary | ICD-10-CM

## 2015-08-08 DIAGNOSIS — I639 Cerebral infarction, unspecified: Secondary | ICD-10-CM

## 2015-08-08 DIAGNOSIS — I679 Cerebrovascular disease, unspecified: Secondary | ICD-10-CM | POA: Insufficient documentation

## 2015-08-08 DIAGNOSIS — I6350 Cerebral infarction due to unspecified occlusion or stenosis of unspecified cerebral artery: Secondary | ICD-10-CM

## 2015-08-08 DIAGNOSIS — I6330 Cerebral infarction due to thrombosis of unspecified cerebral artery: Secondary | ICD-10-CM | POA: Insufficient documentation

## 2015-08-08 LAB — LIPID PANEL
CHOL/HDL RATIO: 4.8 ratio
Cholesterol: 206 mg/dL — ABNORMAL HIGH (ref 0–200)
HDL: 43 mg/dL (ref >40)
LDL CALC: 138 mg/dL — AB (ref 0–99)
TRIGLYCERIDES: 126 mg/dL (ref <150)
VLDL: 25 mg/dL (ref 0–40)

## 2015-08-08 LAB — GLUCOSE, CAPILLARY
GLUCOSE-CAPILLARY: 169 mg/dL — AB (ref 65–99)
GLUCOSE-CAPILLARY: 208 mg/dL — AB (ref 65–99)

## 2015-08-08 MED ORDER — INSULIN ASPART 100 UNIT/ML ~~LOC~~ SOLN
0.0000 [IU] | Freq: Three times a day (TID) | SUBCUTANEOUS | Status: DC
  Administered 2015-08-08: 2 [IU] via SUBCUTANEOUS
  Administered 2015-08-09: 1 [IU] via SUBCUTANEOUS
  Administered 2015-08-09 – 2015-08-10 (×3): 2 [IU] via SUBCUTANEOUS
  Administered 2015-08-10 – 2015-08-11 (×3): 3 [IU] via SUBCUTANEOUS
  Administered 2015-08-11 – 2015-08-12 (×2): 2 [IU] via SUBCUTANEOUS

## 2015-08-08 MED ORDER — ATORVASTATIN CALCIUM 80 MG PO TABS
80.0000 mg | ORAL_TABLET | Freq: Every day | ORAL | Status: DC
  Administered 2015-08-08 – 2015-08-11 (×4): 80 mg via ORAL
  Filled 2015-08-08 (×4): qty 1

## 2015-08-08 NOTE — Progress Notes (Signed)
OT Cancellation Note  Patient Details Name: Annette Munoz MRN: [id number] DOB: 02/22/1966   Cancelled Treatment:    Reason Eval/Treat Not Completed:  (Pt on bed rest.)  Earlie RavelingStraub, Shakeya Kerkman L OTR/L [phone] 08/08/2015, 7:53 AM

## 2015-08-08 NOTE — Progress Notes (Signed)
    [facility] has been requested to perform a transesophageal echocardiogram on Annette Munoz for stroke.   After careful review of history and examination, the risks and benefits of transesophageal echocardiogram have been explained including risks of esophageal damage, perforation (1:10,000 risk), bleeding, pharyngeal hematoma as well as other potential complications associated with conscious sedation including aspiration, arrhythmia, respiratory failure and death. Alternatives to treatment were discussed with the patient and her husband, questions were answered. Patient is willing to proceed.   Abelino DerrickKILROY,Desree Leap K, PA 08/08/2015 11:42 AM

## 2015-08-08 NOTE — Evaluation (Signed)
SLP Cancellation Note  Patient Details Name: Annette SanerBetty H Truluck MRN: [id number] DOB: 10/03/1966   Cancelled treatment:       Reason Eval/Treat Not Completed:  (SLE order received, will complete next date.  thanks)  Donavan Burnetamara Montgomery Favor, MS [facility] [employer] SLP [phone]

## 2015-08-08 NOTE — Progress Notes (Signed)
VASCULAR LAB PRELIMINARY  PRELIMINARY  PRELIMINARY  PRELIMINARY  Carotid duplex completed.    Preliminary report:  1-39% ICA plaquing.  Vertebral artery flow is antegrade.   Colyn Miron, RVT 08/08/2015, 5:17 PM

## 2015-08-08 NOTE — Progress Notes (Signed)
PATIENT DETAILS Name: Annette Munoz Age: 49 y.o. Sex: female Date of Birth: 04/27/1966 Admit Date: 08/07/2015 Admitting Physician Calvert CantorSaima Rizwan, MD PCP:No primary care provider on file.  Subjective: Follow some commands-her husband confused at times.  Assessment/Plan: Principal Problem: Acute embolic CVA: Admitted with behavioral changes, MRI brain shows multiple infarcts involving the left ACA territory, and its acute infarct in the right thalamus-likely embolic infarcts. Telemetry negative for atrial fibrillation. Await transthoracic echo/carotids. Have consulted cardiology for TEE/loop recorder. LDL 138 (goal <70)-increase Lipitor to 80 mg. Await A1c. Continue aspirin, await further recommendations from stroke team.  Active Problems: Dyslipidemia: As above  DM (diabetes mellitus), type 2, uncontrolled: Continue to hold metformin-start SSI and follow CBGs   Hypertension: Allow permissive hypertension-continue to hold lisinopril.  Morbid obesity: Encouraged weight loss  Disposition: Remain inpatient  Antimicrobial agents  See below  Anti-infectives    None      DVT Prophylaxis: Prophylactic Lovenox   Code Status: Full code   Family Communication Spouse at bedside  Procedures: None  CONSULTS:  neurology  Time spent 30 minutes-Greater than 50% of this time was spent in counseling, explanation of diagnosis, planning of further management, and coordination of care.  MEDICATIONS: Scheduled Meds: . aspirin  325 mg Oral Daily  . enoxaparin (LOVENOX) injection  40 mg Subcutaneous Q24H   Continuous Infusions:  PRN Meds:.  PHYSICAL EXAM: Vital signs in last 24 hours: Filed Vitals:   08/08/15 0000 08/08/15 0200 08/08/15 0400 08/08/15 0905  BP: 135/42 144/59 102/47 168/70  Pulse: 69 62 69 64  Temp: 98.4 F (36.9 C)  98.2 F (36.8 C) 98.8 F (37.1 C)  TempSrc: Oral  Oral Oral  Resp: 18 16 14 20   Height:      Weight:      SpO2: 98% 99%  97% 94%    Weight change:  Filed Weights   08/07/15 1134  Weight: 99.791 kg (220 lb)   Body mass index is 40.23 kg/(m^2).   Gen Exam: Awake and alert  Neck: Supple, No JVD.   Chest: B/L Clear.   CVS: S1 S2 Regular, no murmurs.  Abdomen: soft, BS +, non tender, non distended.  Extremities: no edema, lower extremities warm to touch Neurologic: Non Focal.   Skin: No Rash.   Wounds: N/A.    Intake/Output from previous day:  Intake/Output Summary (Last 24 hours) at 08/08/15 1104 Last data filed at 08/07/15 1753  Gross per 24 hour  Intake    120 ml  Output     70 ml  Net     50 ml     LAB RESULTS: CBC  Recent Labs Lab 08/07/15 1206  WBC 6.6  HGB 10.9*  HCT 33.9*  PLT 355  MCV 94.4  MCH 30.4  MCHC 32.2  RDW 13.2  LYMPHSABS 2.3  MONOABS 0.6  EOSABS 0.2  BASOSABS 0.0    Chemistries   Recent Labs Lab 08/07/15 1206  NA 134*  K 3.7  CL 105  CO2 22  GLUCOSE 185*  BUN 12  CREATININE 0.84  CALCIUM 8.6*    CBG:  Recent Labs Lab 08/07/15 1149 08/07/15 2136  GLUCAP 164* 188*    GFR Estimated Creatinine Clearance: 90.5 mL/min (by C-G formula based on Cr of 0.84).  Coagulation profile No results for input(s): INR, PROTIME in the last 168 hours.  Cardiac Enzymes No results for input(s): CKMB, TROPONINI, MYOGLOBIN  in the last 168 hours.  Invalid input(s): CK  Invalid input(s): POCBNP No results for input(s): DDIMER in the last 72 hours. No results for input(s): HGBA1C in the last 72 hours.  Recent Labs  08/08/15 0545  CHOL 206*  HDL 43  LDLCALC 138*  TRIG 126  CHOLHDL 4.8   No results for input(s): TSH, T4TOTAL, T3FREE, THYROIDAB in the last 72 hours.  Invalid input(s): FREET3 No results for input(s): VITAMINB12, FOLATE, FERRITIN, TIBC, IRON, RETICCTPCT in the last 72 hours. No results for input(s): LIPASE, AMYLASE in the last 72 hours.  Urine Studies No results for input(s): UHGB, CRYS in the last 72 hours.  Invalid input(s):  UACOL, UAPR, USPG, UPH, UTP, UGL, UKET, UBIL, UNIT, UROB, ULEU, UEPI, UWBC, URBC, UBAC, CAST, UCOM, BILUA  MICROBIOLOGY: No results found for this or any previous visit (from the past 240 hour(s)).  RADIOLOGY STUDIES/RESULTS: Ct Head Wo Contrast  08/07/2015  CLINICAL DATA:  Hyperglycemia.  Confusion. EXAM: CT HEAD WITHOUT CONTRAST TECHNIQUE: Contiguous axial images were obtained from the base of the skull through the vertex without intravenous contrast. COMPARISON:  None. FINDINGS: There is a focal area of low density in the white matter anterior to the frontal horn of the left lateral ventricle. It is probably in the anterior corpus callosum. No midline shift. No hemorrhage. IMPRESSION: Focal low density in the left frontal lobe white matter which may be in the corpus callosum. Acute infarct is not excluded. Consider MRI to further characterize. Electronically Signed   By: Jolaine Click M.D.   On: 08/07/2015 12:55   Mr Brain Wo Contrast  08/07/2015  CLINICAL DATA:  Altered mental status for 4 days. History of hypertension and diabetes. EXAM: MRI HEAD WITHOUT CONTRAST MRA HEAD WITHOUT CONTRAST TECHNIQUE: Multiplanar, multiecho pulse sequences of the brain and surrounding structures were obtained without intravenous contrast. Angiographic images of the head were obtained using MRA technique without contrast. COMPARISON:  Head CT 08/07/2015 FINDINGS: MRI HEAD FINDINGS The images are mildly to moderately motion degraded despite using faster, more motion resistant protocols. There are multiple small left ACA territory acute infarcts. The largest measures 1.3 cm and involves the corpus callosum anterior to the left frontal horn. There are additional punctate left ACA infarcts involving the left cingulate gyrus and high a medial left frontal lobe. There is also an approximately 2 cm acute infarct in the region of the anteroinferior right thalamus and right cerebral peduncle. There is mild cytotoxic edema  associated with these infarcts without associated mass effect. There is no evidence intracranial hemorrhage, mass, midline shift, or extra-axial fluid collection. Ventricles and sulci are normal for age. Orbits are unremarkable. Paranasal sinuses and mastoid air cells are clear. Major intracranial vascular flow voids are preserved. MRA HEAD FINDINGS Study is moderately to severely motion degraded. The visualized distal vertebral arteries are patent with the left being dominant. Basilar artery is patent without evidence of significant stenosis. PCAs are grossly patent proximally but are poorly evaluated due to motion artifact. Internal carotid arteries are patent from skullbase to carotid termini. No significant ICA stenosis is identified, although cavernous and supraclinoid segments are suboptimally evaluated due to motion artifact. M1 and A1 segments are patent without evidence of gross stenosis. A2 segments are patent proximally although there is suggestion of segmental narrowing more distally, however evaluation is limited by motion artifact. MCA bifurcations are patent. No gross intracranial aneurysm is identified. IMPRESSION: 1. Multiple small, acute left ACA territory infarcts involving the corpus callosum, cingulate  gyrus, and superior left frontal lobe. 2. Small acute infarct involving the anteroinferior right thalamus/cerebral peduncle. 3. Moderately to severely motion degraded head MRA without evidence of large vessel occlusion or flow limiting proximal stenosis. Suggestion of A2 ACA segmental narrowing. Electronically Signed   By: Sebastian Ache M.D.   On: 08/07/2015 14:45   Mr [name] Head/brain Wo Cm  08/07/2015  CLINICAL DATA:  Altered mental status for 4 days. History of hypertension and diabetes. EXAM: MRI HEAD WITHOUT CONTRAST MRA HEAD WITHOUT CONTRAST TECHNIQUE: Multiplanar, multiecho pulse sequences of the brain and surrounding structures were obtained without intravenous contrast. Angiographic  images of the head were obtained using MRA technique without contrast. COMPARISON:  Head CT 08/07/2015 FINDINGS: MRI HEAD FINDINGS The images are mildly to moderately motion degraded despite using faster, more motion resistant protocols. There are multiple small left ACA territory acute infarcts. The largest measures 1.3 cm and involves the corpus callosum anterior to the left frontal horn. There are additional punctate left ACA infarcts involving the left cingulate gyrus and high a medial left frontal lobe. There is also an approximately 2 cm acute infarct in the region of the anteroinferior right thalamus and right cerebral peduncle. There is mild cytotoxic edema associated with these infarcts without associated mass effect. There is no evidence intracranial hemorrhage, mass, midline shift, or extra-axial fluid collection. Ventricles and sulci are normal for age. Orbits are unremarkable. Paranasal sinuses and mastoid air cells are clear. Major intracranial vascular flow voids are preserved. MRA HEAD FINDINGS Study is moderately to severely motion degraded. The visualized distal vertebral arteries are patent with the left being dominant. Basilar artery is patent without evidence of significant stenosis. PCAs are grossly patent proximally but are poorly evaluated due to motion artifact. Internal carotid arteries are patent from skullbase to carotid termini. No significant ICA stenosis is identified, although cavernous and supraclinoid segments are suboptimally evaluated due to motion artifact. M1 and A1 segments are patent without evidence of gross stenosis. A2 segments are patent proximally although there is suggestion of segmental narrowing more distally, however evaluation is limited by motion artifact. MCA bifurcations are patent. No gross intracranial aneurysm is identified. IMPRESSION: 1. Multiple small, acute left ACA territory infarcts involving the corpus callosum, cingulate gyrus, and superior left frontal  lobe. 2. Small acute infarct involving the anteroinferior right thalamus/cerebral peduncle. 3. Moderately to severely motion degraded head MRA without evidence of large vessel occlusion or flow limiting proximal stenosis. Suggestion of A2 ACA segmental narrowing. Electronically Signed   By: Sebastian Ache M.D.   On: 08/07/2015 14:45    Jeoffrey Massed, MD  [facility] Pager:[phone]  If 7PM-7AM, please contact night-coverage [email or web address] Password [email or web address] 08/08/2015, 11:04 AM   LOS: 1 day

## 2015-08-08 NOTE — Evaluation (Signed)
Physical Therapy Evaluation Patient Details Name: Annette Munoz Payment MRN: [id number] DOB: 03/17/1966 Today's Date: 08/08/2015   History of Present Illness  Annette Munoz Knipple is a 49 y.o. female adm 08/07/2015 for evaluation of a several day history of altered mental status with memory difficulties (first noted 10/19).  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 There was also a small infarct right thalamus/cerebral peduncle. PMHx- diabetes mellitus, hypertension, obesity, and hyperlipidemia  Clinical Impression  Pt admitted with above diagnosis. Patient with balance impairments with score of 38/56 on Berg Balance assessment. Decreased attention and decr awareness of Lt UE more than LE. Pt currently with functional limitations due to the deficits listed below (see PT Problem List).  Pt will benefit from skilled PT to increase their independence and safety with mobility to allow discharge to the venue listed below.       Follow Up Recommendations CIR    Equipment Recommendations  Other (comment) (TBA)    Recommendations for Other Services Rehab consult;OT consult;Speech consult     Precautions / Restrictions Precautions Precautions: Fall      Mobility  Bed Mobility Overal bed mobility: Needs Assistance Bed Mobility: Supine to Sit;Sit to Supine     Supine to sit: Min guard Sit to supine: Supervision   General bed mobility comments: moved impulsively with loss of balance to sit up; supervision due to decr cognition  Transfers Overall transfer level: Needs assistance Equipment used: None Transfers: Sit to/from Stand Sit to Stand: Min guard         General transfer comment: slightly unsteady with stagger step each time  Ambulation/Gait Ambulation/Gait assistance: Min guard Ambulation Distance (Feet): 200 Feet Assistive device: None Gait Pattern/deviations: Step-through pattern;Drifts right/left;Wide base of  support Gait velocity: WNL; able to vary up/down Gait velocity interpretation: at or above normal speed for age/gender General Gait Details: slight drifting with head turns and distractions;  Stairs            Wheelchair Mobility    Modified Rankin (Stroke Patients Only) Modified Rankin (Stroke Patients Only) Pre-Morbid Rankin Score: No symptoms Modified Rankin: Moderately severe disability     Balance Overall balance assessment: Needs assistance Sitting-balance support: No upper extremity supported;Feet supported Sitting balance-Leahy Scale: Fair Sitting balance - Comments: as shifting to put on her socks (bringing leg up to cross), she lost balance posteriorly Postural control: Posterior lean Standing balance support: No upper extremity supported Standing balance-Leahy Scale: Poor Standing balance comment: incr ant-post sway with posterior bias and occasional stagger step backwards             High level balance activites: Side stepping;Backward walking;Direction changes;Turns;Sudden stops;Head turns High Level Balance Comments: overall only drifted with head turns Standardized Balance Assessment Standardized Balance Assessment : Berg Balance Test;Dynamic Gait Index Berg Balance Test Sit to Stand: Able to stand using hands after several tries Standing Unsupported: Able to stand 30 seconds unsupported (gets distracted) Sitting with Back Unsupported but Feet Supported on Floor or Stool: Able to sit 30 seconds Stand to Sit: Sits safely with minimal use of hands Transfers: Able to transfer safely, minor use of hands Standing Unsupported with Eyes Closed: Able to stand 10 seconds with supervision Standing Ubsupported with Feet Together: Able to place feet together independently but unable to hold for 30 seconds From Standing, Reach Forward with Outstretched Arm: Can reach confidently >25 cm (10") From Standing Position, Pick up Object from Floor: Able to pick up  shoe  safely and easily From Standing Position, Turn to Look Behind Over each Shoulder: Looks behind from both sides and weight shifts well Turn 360 Degrees: Able to turn 360 degrees safely in 4 seconds or less Standing Unsupported, Alternately Place Feet on Step/Stool: Able to complete >2 steps/needs minimal assist Standing Unsupported, One Foot in Front: Needs help to step but can hold 15 seconds Standing on One Leg: Tries to lift leg/unable to hold 3 seconds but remains standing independently Total Score: 38 Dynamic Gait Index Level Surface: Mild Impairment Change in Gait Speed: Mild Impairment Gait with Horizontal Head Turns: Mild Impairment Gait with Vertical Head Turns: Mild Impairment Gait and Pivot Turn: Mild Impairment Step Around Obstacles: Mild Impairment       Pertinent Vitals/Pain Pain Assessment: No/denies pain    Home Living Family/patient expects to be discharged to:: Private residence Living Arrangements: Spouse/significant other;Children (15, 28 yo) Available Help at Discharge: Family;Friend(s) Type of Home: House Home Access: Stairs to enter Entrance Stairs-Rails: Doctor, general practice of Steps: 10 Home Layout: One level Home Equipment: None      Prior Function Level of Independence: Independent         Comments: hairdresser     Hand Dominance   Dominant Hand: Right    Extremity/Trunk Assessment   Upper Extremity Assessment: Defer to OT evaluation (LUE "wandering"; tested proprioception (decreased))           Lower Extremity Assessment: LLE deficits/detail   LLE Deficits / Details: decr awareness LLE (when seated, her foot/ankle rolled over and she was unaware until pointed out); proprioception intact (tested via placing RLE and she placed Lt to match)  Cervical / Trunk Assessment: Other exceptions  Communication   Communication: Other (comment) (sing-songy (at times actually sings her answer))  Cognition Arousal/Alertness:  Awake/alert Behavior During Therapy: Restless;Impulsive (euphoric) Overall Cognitive Status: Impaired/Different from baseline Area of Impairment: Memory;Following commands;Safety/judgement;Awareness     Memory: Decreased short-term memory;Decreased recall of precautions Following Commands: Follows one step commands with increased time Safety/Judgement: Decreased awareness of safety;Decreased awareness of deficits Awareness: Intellectual (could state she'd been told she had a stroke)   General Comments: Husband present. Pt euphoric, restless (repeatedly pulling covers and gown up to expose her bottom). Incr time and requires repetition at times to follow one step commands    General Comments General comments (skin integrity, edema, etc.): Husband present. discussed role of frontal lobe and changes in pt. Encouraged quiet times to allow her brain to heal (on arrival, 5 people in room, TV loud)    Exercises        Assessment/Plan    PT Assessment Patient needs continued PT services  PT Diagnosis Difficulty walking;Altered mental status   PT Problem List Decreased balance;Decreased mobility;Decreased cognition;Decreased knowledge of use of DME;Decreased safety awareness;Decreased knowledge of precautions;Obesity  PT Treatment Interventions DME instruction;Gait training;Stair training;Functional mobility training;Therapeutic activities;Balance training;Cognitive remediation;Patient/family education   PT Goals (Current goals can be found in the Care Plan section) Acute Rehab PT Goals Patient Stated Goal: unable to state PT Goal Formulation: With patient/family Time For Goal Achievement: 08/15/15 Potential to Achieve Goals: Good    Frequency Min 4X/week   Barriers to discharge        Co-evaluation               End of Session Equipment Utilized During Treatment: Gait belt Activity Tolerance: Patient tolerated treatment well Patient left: in bed;with call bell/phone within  reach;with bed alarm set;with family/visitor present Nurse  Communication: Mobility status         Time: 1450-1517 PT Time Calculation (min) (ACUTE ONLY): 27 min   Charges:   PT Evaluation $Initial PT Evaluation Tier I: 1 Procedure PT Treatments $Gait Training: 8-22 mins   PT G Codes:        Alie Moudy 26-Aug-2015, 3:50 PM Pager [phone]

## 2015-08-08 NOTE — Progress Notes (Signed)
Utilization review completed.  

## 2015-08-08 NOTE — Progress Notes (Signed)
STROKE TEAM PROGRESS NOTE   HISTORY Annette Munoz is a 49 y.o. female with a history of diabetes mellitus, hypertension, obesity, and hyperlipidemia, who [facility] on 08/07/2015 for evaluation of a several day history of altered mental status with memory difficulties. In the emergency department a CT scan of the head was performed that was consistent with a possible left frontal lobe infarct. An MRI revealed multiple small acute left ACA territory infarcts involving the corpus callosum, cingulate gyrus, and superior left frontal lobe. There was also a small infarct involving the anterior-inferior right thalamus/cerebral peduncle.   The patient's husband stated that he came home from work Wednesday evening and noted the patient be somewhat confused at that time. She had difficulty remembering details about their son's football game. 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 EMS was summoned; however, the patient declined evaluation. Today the patient continued to demonstrate odd behavior and her husband brought her to the emergency department for further evaluation. She has a strange affect which her husband notes is not her usual demeanor. She denies drug or alcohol use. She does not take aspirin on a regular basis. She denies palpitations, racing heart rate, or irregular heart rate.  Date last known well: Date: 08/04/2015 Time last known well: Unable to determine tPA Given: No: - Time of presentation was outside the therapeutic window - deficits mild.      SUBJECTIVE (INTERVAL HISTORY) The patient's husband is at the bedside. The patient is alert and without complaints. She continues to appear somewhat restless with an odd affect. She is oriented.   OBJECTIVE Temp:  [98.1 F (36.7 C)-98.8 F (37.1 C)] 98.8 F (37.1 C) (10/23 0905) Pulse Rate:  [52-70] 64 (10/23 0905) Cardiac Rhythm:  [-]  Sinus bradycardia (10/23 0700) Resp:  [14-24] 20 (10/23 0905) BP: (102-177)/(42-80) 168/70 mmHg (10/23 0905) SpO2:  [94 %-100 %] 94 % (10/23 0905)  CBC:  Recent Labs Lab 08/07/15 1206  WBC 6.6  NEUTROABS 3.6  HGB 10.9*  HCT 33.9*  MCV 94.4  PLT 355    Basic Metabolic Panel:  Recent Labs Lab 08/07/15 1206  NA 134*  K 3.7  CL 105  CO2 22  GLUCOSE 185*  BUN 12  CREATININE 0.84  CALCIUM 8.6*    Lipid Panel:    Component Value Date/Time   CHOL 206* 08/08/2015 0545   TRIG 126 08/08/2015 0545   HDL 43 08/08/2015 0545   CHOLHDL 4.8 08/08/2015 0545   VLDL 25 08/08/2015 0545   LDLCALC 138* 08/08/2015 0545   HgbA1c:  Lab Results  Component Value Date   HGBA1C 7.2 06/24/2015   Urine Drug Screen:    Component Value Date/Time   LABOPIA NONE DETECTED 08/07/2015 1754   COCAINSCRNUR NONE DETECTED 08/07/2015 1754   LABBENZ NONE DETECTED 08/07/2015 1754   AMPHETMU NONE DETECTED 08/07/2015 1754   THCU NONE DETECTED 08/07/2015 1754   LABBARB NONE DETECTED 08/07/2015 1754      IMAGING  Ct Head Wo Contrast 08/07/2015   Focal low density in the left frontal lobe white matter which may be in the corpus callosum. Acute infarct is not excluded.     Mr Annette GlennMra Head/brain Wo Cm 08/07/2015   1. Multiple small, acute left ACA territory infarcts involving the corpus callosum, cingulate gyrus, and superior left frontal lobe.  2. Small acute infarct involving the anteroinferior right thalamus/cerebral peduncle.  3. Moderately to severely motion degraded head MRA without evidence of large vessel occlusion or flow limiting proximal stenosis. Suggestion of A2 ACA segmental narrowing.    PHYSICAL EXAM  Mental Status: Alert, oriented, thought content appropriate.Somewhat restless with an odd affect. Speech fluent without evidence of aphasia. Able to follow 3 step commands without difficulty. Cranial Nerves: II: Visual fields grossly normal, pupils equal, round, reactive to light and  accommodation III,IV, VI: ptosis not present, extra-ocular motions intact bilaterally V,VII: smile symmetric, facial light touch sensation normal bilaterally VIII: hearing normal bilaterally IX,X: gag reflex present XI: bilateral shoulder shrug XII: midline tongue extension Motor: Right :Upper extremity 5/5Left: Upper extremity 5/5 Lower extremity 5/5Lower extremity 5/5 Tone and bulk:normal tone throughout; no atrophy noted Sensory: Light touch intact throughout, bilaterally Deep Tendon Reflexes: 1+ and symmetric throughout Plantars: Right: downgoingLeft: downgoing Cerebellar: normal finger-to-nose    ASSESSMENT/PLAN [name] is a 49 y.o. female with history of diabetes mellitus, hypertension, obesity, and hyperlipidemia, presenting with altered mental status and memory difficulties.. She did not receive IV t-PA due to late presentation and mild deficits.  Stroke:  Bilateral strokes probably embolic from an unknown source.  Resultant  altered mental status and memory deficits  MRI - multiple bilateral small infarcts as noted above.  MRA - motion degraded with no large vessel occlusion or stenosis.  Carotid Doppler - pending  2D Echo pending  LDL 138  HgbA1c pending  VTE prophylaxis - Lovenox  Diet heart healthy/carb modified Room service appropriate?: Yes; Fluid consistency:: Thin  Diet NPO time specified  No antithrombotic prior to admission, now on aspirin 325 mg daily  Patient counseled to be compliant with her antithrombotic medications  Ongoing aggressive stroke risk factor management  Therapy recommendations: Pending  Disposition: Pending  Hypertension  Labile blood pressures Permissive hypertension (OK if < 220/120) but gradually normalize in 5-7 days  Hyperlipidemia  Home meds: Lipitor 20 mg  daily prior to admission changed to 80 mg daily.  LDL 138, goal < 70  Lipitor 20 mg daily prior to admission changed to 80 mg daily.  Continue statin at discharge  Diabetes  HgbA1c pending, goal < 7.0  Uncontrolled  Other Stroke Risk Factors  ETOH use  Obesity, Body mass index is 40.23 kg/(m^2).    Other Active Problems  Anemia   Plan  TEE - tentatively planned for Monday - may need Loop implanted.  Aspirin therapy  Hospital day # 1   Delton See [facility] Pager [phone] 08/08/2015, 12:15 PM  Lesly Dukes Phone [phone]   To contact Stroke Continuity provider, please refer to [email or web address]. After hours, contact General Neurology

## 2015-08-08 NOTE — Progress Notes (Signed)
PT Cancellation Note  Patient Details Name: Annette SanerBetty H Munoz MRN: [id number] DOB: 11/12/1965   Cancelled Treatment:    Reason Eval/Treat Not Completed: Patient not medically ready. Pt continues on bedrest. MD, please order increased activity when appropriate. Will see today if updated orders.   Cori Justus 08/08/2015, 8:01 AM  Pager [phone]

## 2015-08-08 NOTE — Progress Notes (Signed)
  Echocardiogram 2D Echocardiogram has been performed.  Annette Munoz, Annette Munoz 08/08/2015, 2:47 PM

## 2015-08-09 DIAGNOSIS — E7800 Pure hypercholesterolemia, unspecified: Secondary | ICD-10-CM

## 2015-08-09 DIAGNOSIS — I10 Essential (primary) hypertension: Secondary | ICD-10-CM

## 2015-08-09 DIAGNOSIS — I69919 Unspecified symptoms and signs involving cognitive functions following unspecified cerebrovascular disease: Secondary | ICD-10-CM

## 2015-08-09 DIAGNOSIS — E1169 Type 2 diabetes mellitus with other specified complication: Secondary | ICD-10-CM

## 2015-08-09 DIAGNOSIS — I6330 Cerebral infarction due to thrombosis of unspecified cerebral artery: Secondary | ICD-10-CM

## 2015-08-09 DIAGNOSIS — E1165 Type 2 diabetes mellitus with hyperglycemia: Secondary | ICD-10-CM

## 2015-08-09 LAB — HEMOGLOBIN A1C
Hgb A1c MFr Bld: 7.2 % — ABNORMAL HIGH (ref 4.8–5.6)
Mean Plasma Glucose: 160 mg/dL

## 2015-08-09 LAB — GLUCOSE, CAPILLARY
GLUCOSE-CAPILLARY: 193 mg/dL — AB (ref 65–99)
GLUCOSE-CAPILLARY: 158 mg/dL — AB (ref 65–99)
Glucose-Capillary: 142 mg/dL — ABNORMAL HIGH (ref 65–99)
Glucose-Capillary: 195 mg/dL — ABNORMAL HIGH (ref 65–99)

## 2015-08-09 NOTE — Evaluation (Signed)
Occupational Therapy Evaluation Patient Details Name: Annette Munoz MRN: [id number] DOB: 03/08/1966 Today's Date: 08/09/2015    History of Present Illness Annette Munoz is a 49 y.o. female adm 08/07/2015 for evaluation of a several day history of altered mental status with memory difficulties (first noted 10/19).  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 There was also a small infarct right thalamus/cerebral peduncle. PMHx- diabetes mellitus, hypertension, obesity, and hyperlipidemia   Clinical Impression   Pt admitted with above. She demonstrates the below listed deficits and will benefit from continued OT to maximize safety and independence with BADLs.  Pt presents to OT with impaired cognition including short term memory deficits, decreased awareness, decreased safety and judgement, as well as mild balance deficits, and mild Lt inattention.  She requires supervision for ADLs and  mod - max a for IADLs.  Pt with poor awareness of deficits and will need 24 hour close supervision at discharge.   She is at high risk for injury as she may forget to turn off stove, attempt to drive, wonder, get lost, etc.   If 24 hour supervision is not available, recommend SNF level rehab to address awareness and to work on compensatory strategies for memory loss.       Follow Up Recommendations  SNF;Supervision/Assistance - 24 hour    Equipment Recommendations  None recommended by OT    Recommendations for Other Services       Precautions / Restrictions Precautions Precautions: Fall Restrictions Weight Bearing Restrictions: No      Mobility Bed Mobility Overal bed mobility: Needs Assistance Bed Mobility: Supine to Sit   Sidelying to sit: Supervision Supine to sit: Supervision     General bed mobility comments: No assist required. Pt sleeping when PT entered and required several cues before she initiated transfer to  sitting.   Transfers Overall transfer level: Needs assistance Equipment used: None Transfers: Sit to/from Stand Sit to Stand: Supervision Stand pivot transfers: Supervision       General transfer comment: Supervision for safety. Grossly steady with no LOB noted.     Balance Overall balance assessment: Needs assistance Sitting-balance support: Feet supported;No upper extremity supported Sitting balance-Leahy Scale: Fair     Standing balance support: No upper extremity supported;During functional activity Standing balance-Leahy Scale: Fair                              ADL Overall ADL's : Needs assistance/impaired Eating/Feeding: Supervision/ safety;Sitting   Grooming: Wash/dry hands;Wash/dry face;Oral care;Brushing hair;Supervision/safety;Standing   Upper Body Bathing: Supervision/ safety;Set up;Sitting   Lower Body Bathing: Supervison/ safety;Sit to/from stand   Upper Body Dressing : Set up;Supervision/safety;Sitting   Lower Body Dressing: Supervision/safety;Sit to/from stand   Toilet Transfer: Modified Independent;Ambulation   Toileting- Clothing Manipulation and Hygiene: Supervision/safety;Sit to/from stand       Functional mobility during ADLs: Modified independent General ADL Comments: Pt requires supervision due to memory deficits and poor awarenss of deficts coupled with disinibition      Vision Vision Assessment?: No apparent visual deficits   Perception Perception Perception Tested?: Yes Perception Deficits: Inattention/neglect Inattention/Neglect: Does not attend to left side of body Spatial deficits: mild Lt inattention    Praxis      Pertinent Vitals/Pain Pain Assessment: No/denies pain     Hand Dominance Right   Extremity/Trunk Assessment Upper Extremity Assessment Upper Extremity Assessment: LUE deficits/detail LUE Deficits /  Details: appears to have mildd left inattention.   She will brush into wall on Lt and will bump her  arm into wall with decreased awareness at times, but incorporates it fully into activity    Lower Extremity Assessment Lower Extremity Assessment: Defer to PT evaluation       Communication Communication Communication: No difficulties   Cognition Arousal/Alertness: Awake/alert Behavior During Therapy: WFL for tasks assessed/performed Overall Cognitive Status: Impaired/Different from baseline Area of Impairment: Memory;Safety/judgement;Awareness   Current Attention Level: Selective Memory: Decreased short-term memory;Decreased recall of precautions Following Commands: Follows one step commands consistently;Follows one step commands with increased time Safety/Judgement: Decreased awareness of safety;Decreased awareness of deficits Awareness: Emergent   General Comments: Pt is impulsive and disinhibited.  She initially states she has no deficits, but after performing path finding activities, and with trial and error, she stated "my memory really is gone, isn't it"  Pt requires mod cues for memory deficits, but demonstrates good problem solving skills   General Comments       Exercises Exercises: General Lower Extremity     Shoulder Instructions      Home Living Family/patient expects to be discharged to:: Private residence Living Arrangements: Spouse/significant other;Children 42(16 y.o. son) Available Help at Discharge: Family;Friend(s);Available PRN/intermittently Type of Home: House Home Access: Stairs to enter Entergy CorporationEntrance Stairs-Number of Steps: 10 Entrance Stairs-Rails: Right;Left Home Layout: One level     Bathroom Shower/Tub: Chief Strategy OfficerTub/shower unit   Bathroom Toilet: Standard     Home Equipment: None   Additional Comments: daughter [place] Consulting civil [place]student [place], spouse works during the day.       Prior Functioning/Environment Level of Independence: Independent        Comments: hairdresser    OT Diagnosis: Generalized weakness;Cognitive deficits   OT Problem List: Impaired  balance (sitting and/or standing);Decreased cognition;Decreased safety awareness;Obesity   OT Treatment/Interventions: Self-care/ADL training;DME and/or AE instruction;Therapeutic activities;Cognitive remediation/compensation;Patient/family education;Balance training    OT Goals(Current goals can be found in the care plan section) Acute Rehab OT Goals Patient Stated Goal: unable to state OT Goal Formulation: With patient/family Time For Goal Achievement: 09/23/15 Potential to Achieve Goals: Good  OT Frequency: Min 3X/week   Barriers to D/C: Decreased caregiver support          Co-evaluation              End of Session Nurse Communication: Mobility status  Activity Tolerance: Patient tolerated treatment well Patient left: in bed;with call bell/phone within reach;with family/visitor present   Time: 1204-1253 OT Time Calculation (min): 49 min Charges:  OT General Charges $OT Visit: 1 Procedure OT Evaluation $Initial OT Evaluation Tier I: 1 Procedure OT Treatments $Therapeutic Activity: 23-37 mins G-Codes:    Tila Millirons M 08/09/2015, 1:22 PM

## 2015-08-09 NOTE — Consult Note (Signed)
Physical Medicine and Rehabilitation Consult  Reason for Consult: Confusion and unsteady gait Referring Physician: Dr. Allena Katz   HPI: Annette Munoz is a 49 y.o. RH-female with history of DM type 2, obesity who was admitted on 08/07/15 with reports of confusion x few days as well as abnormal behaviors.  MRI brain done revealing multiple small, acute left ACA territory infarcts involving the corpus callosum, cingulate gyrus, superior left frontal lobe and small infarct anterioinferior right thalamus/cerebral peduncle. MRA negative for occlusion or stenosis but limited due to excessive patient motion.  2D echo with EF 55-60% and no wall abnormality. Carotid dopplers without significant stenosis.  Cardiology consulted for TEE to workup source of embolic source of unknown etiology and patient place on ASA for secondary stroke prevention. When asked why the patient is in the hospital, she states, "they say I had a mini-stroke."  PT evaluation done yesterday and CIR recommended due to impulsivity with balance deficits, euphoric behaviors as well as poor awareness of left side.    Review of Systems  HENT: Negative for hearing loss.   Eyes: Negative for blurred vision, double vision and photophobia.  Cardiovascular: Negative for chest pain and palpitations.  Gastrointestinal: Negative for vomiting and constipation.  Genitourinary: Negative for dysuria, urgency and frequency.  Musculoskeletal: Negative for myalgias, back pain and joint pain.  Neurological: Negative for seizures and headaches.  All other systems reviewed and are negative.  Past Medical History  Diagnosis Date  . Diabetes mellitus without complication [facility])     Past Surgical History  Procedure Laterality Date  . Cesarean section      Family History  Problem Relation Age of Onset  . Diabetes Mother   . Heart disease Mother   . Hypertension Mother   . Hypertension Father     Social History:  Married. Independent and  self employed hairdresser. She reports that she smoked for about 30 years--quit 3 years ago.  She does not use smokeless tobacco.  She reports that she drinks alcohol once a month. She reports that she does not use illicit drugs.    Allergies: No Known Allergies    Medications Prior to Admission  Medication Sig Dispense Refill  . aspirin-acetaminophen-caffeine (EXCEDRIN EXTRA STRENGTH) 250-250-65 MG tablet Take 2 tablets by mouth daily as needed (pain).    Marland Kitchen HYDROcodone-acetaminophen (NORCO/VICODIN) 5-325 MG tablet Take 1 tablet by mouth every 4 (four) hours as needed for moderate pain.    . metFORMIN (GLUCOPHAGE XR) 500 MG 24 hr tablet Take 2 tablets (1,000 mg total) by mouth 2 (two) times daily. (Patient taking differently: Take 1,000 mg by mouth at bedtime. ) 360 tablet 0  . atorvastatin (LIPITOR) 20 MG tablet Take 1 tablet (20 mg total) by mouth daily. (Patient not taking: Reported on 08/07/2015) 90 tablet 0  . lisinopril (PRINIVIL,ZESTRIL) 5 MG tablet Take 1 tablet (5 mg total) by mouth daily. (Patient not taking: Reported on 08/07/2015) 90 tablet 0    Home: Home Living Family/patient expects to be discharged to:: Private residence Living Arrangements: Spouse/significant other, Children (15, 30 yo) Available Help at Discharge: Family, Friend(s) Type of Home: House Home Access: Stairs to enter Secretary/administrator of Steps: 10 Entrance Stairs-Rails: Right, Left Home Layout: One level Home Equipment: None  Functional History: Prior Function Level of Independence: Independent Comments: hairdresser Functional Status:  Mobility: Bed Mobility Overal bed mobility: Needs Assistance Bed Mobility: Supine to Sit, Sit to Supine Supine to sit: Min guard Sit to  supine: Supervision General bed mobility comments: moved impulsively with loss of balance to sit up; supervision due to decr cognition Transfers Overall transfer level: Needs assistance Equipment used: None Transfers: Sit  to/from Stand Sit to Stand: Min guard General transfer comment: slightly unsteady with stagger step each time Ambulation/Gait Ambulation/Gait assistance: Min guard Ambulation Distance (Feet): 200 Feet Assistive device: None Gait Pattern/deviations: Step-through pattern, Drifts right/left, Wide base of support General Gait Details: slight drifting with head turns and distractions; Gait velocity: WNL; able to vary up/down Gait velocity interpretation: at or above normal speed for age/gender    ADL:    Cognition: Cognition Overall Cognitive Status: Impaired/Different from baseline Orientation Level: Oriented to person, Oriented to place, Oriented to time, Disoriented to situation Cognition Arousal/Alertness: Awake/alert Behavior During Therapy: Restless, Impulsive (euphoric) Overall Cognitive Status: Impaired/Different from baseline Area of Impairment: Memory, Following commands, Safety/judgement, Awareness Memory: Decreased short-term memory, Decreased recall of precautions Following Commands: Follows one step commands with increased time Safety/Judgement: Decreased awareness of safety, Decreased awareness of deficits Awareness: Intellectual (could state she'd been told she had a stroke) General Comments: Husband present. Pt euphoric, restless (repeatedly pulling covers and gown up to expose her bottom). Incr time and requires repetition at times to follow one step commands   Blood pressure 161/66, pulse 78, temperature 99.2 F (37.3 C), temperature source Oral, resp. rate 20, height 5\' 2"  (1.575 m), weight 99.791 kg (220 lb), last menstrual period 08/05/2015, SpO2 94 %. Physical Exam  Nursing note and vitals reviewed. Constitutional: She is oriented to person, place, and time. She appears well-developed and well-nourished. No distress.  HENT:  Head: Normocephalic and atraumatic.  Mouth/Throat: Oropharynx is clear and moist.  Eyes: Conjunctivae and EOM are normal. Pupils are  equal, round, and reactive to light.  Neck: Normal range of motion. Neck supple.  Cardiovascular: Normal rate and regular rhythm.   Respiratory: Effort normal and breath sounds normal. No respiratory distress. She has no wheezes.  GI: Soft. Bowel sounds are normal. She exhibits no distension. There is no tenderness.  Musculoskeletal: She exhibits no edema or tenderness.  Strength: B/l UE, RLE 5/5 grossly LLE 4+/5 grossly  Neurological: She is alert and oriented to person, place, and time. No cranial nerve deficit.  Speech clear.   Able to follow 2 step commands and moves all four equally.     Skin: Skin is warm and dry. No rash noted. She is not diaphoretic. No erythema.  Psychiatric: She has a normal mood and affect. Her speech is normal. She expresses impulsivity.    Results for orders placed or performed during the hospital encounter of 08/07/15 (from the past 24 hour(s))  Glucose, capillary     Status: Abnormal   Collection Time: 08/08/15 11:49 AM  Result Value Ref Range   Glucose-Capillary 169 (H) 65 - 99 mg/dL   Comment 1 Notify RN    Comment 2 Document in Chart   Glucose, capillary     Status: Abnormal   Collection Time: 08/08/15  9:02 PM  Result Value Ref Range   Glucose-Capillary 208 (H) 65 - 99 mg/dL   Comment 1 Notify RN    Comment 2 Document in Chart    Ct Head Wo Contrast  08/07/2015  CLINICAL DATA:  Hyperglycemia.  Confusion. EXAM: CT HEAD WITHOUT CONTRAST TECHNIQUE: Contiguous axial images were obtained from the base of the skull through the vertex without intravenous contrast. COMPARISON:  None. FINDINGS: There is a focal area of low density in the  white matter anterior to the frontal horn of the left lateral ventricle. It is probably in the anterior corpus callosum. No midline shift. No hemorrhage. IMPRESSION: Focal low density in the left frontal lobe white matter which may be in the corpus callosum. Acute infarct is not excluded. Consider MRI to further  characterize. Electronically Signed   By: Jolaine Click M.D.   On: 08/07/2015 12:55   Mr Brain Wo Contrast  08/07/2015  CLINICAL DATA:  Altered mental status for 4 days. History of hypertension and diabetes. EXAM: MRI HEAD WITHOUT CONTRAST MRA HEAD WITHOUT CONTRAST TECHNIQUE: Multiplanar, multiecho pulse sequences of the brain and surrounding structures were obtained without intravenous contrast. Angiographic images of the head were obtained using MRA technique without contrast. COMPARISON:  Head CT 08/07/2015 FINDINGS: MRI HEAD FINDINGS The images are mildly to moderately motion degraded despite using faster, more motion resistant protocols. There are multiple small left ACA territory acute infarcts. The largest measures 1.3 cm and involves the corpus callosum anterior to the left frontal horn. There are additional punctate left ACA infarcts involving the left cingulate gyrus and high a medial left frontal lobe. There is also an approximately 2 cm acute infarct in the region of the anteroinferior right thalamus and right cerebral peduncle. There is mild cytotoxic edema associated with these infarcts without associated mass effect. There is no evidence intracranial hemorrhage, mass, midline shift, or extra-axial fluid collection. Ventricles and sulci are normal for age. Orbits are unremarkable. Paranasal sinuses and mastoid air cells are clear. Major intracranial vascular flow voids are preserved. MRA HEAD FINDINGS Study is moderately to severely motion degraded. The visualized distal vertebral arteries are patent with the left being dominant. Basilar artery is patent without evidence of significant stenosis. PCAs are grossly patent proximally but are poorly evaluated due to motion artifact. Internal carotid arteries are patent from skullbase to carotid termini. No significant ICA stenosis is identified, although cavernous and supraclinoid segments are suboptimally evaluated due to motion artifact. M1 and A1  segments are patent without evidence of gross stenosis. A2 segments are patent proximally although there is suggestion of segmental narrowing more distally, however evaluation is limited by motion artifact. MCA bifurcations are patent. No gross intracranial aneurysm is identified. IMPRESSION: 1. Multiple small, acute left ACA territory infarcts involving the corpus callosum, cingulate gyrus, and superior left frontal lobe. 2. Small acute infarct involving the anteroinferior right thalamus/cerebral peduncle. 3. Moderately to severely motion degraded head MRA without evidence of large vessel occlusion or flow limiting proximal stenosis. Suggestion of A2 ACA segmental narrowing. Electronically Signed   By: Sebastian Ache M.D.   On: 08/07/2015 14:45   Mr [name] Head/brain Wo Cm  08/07/2015  CLINICAL DATA:  Altered mental status for 4 days. History of hypertension and diabetes. EXAM: MRI HEAD WITHOUT CONTRAST MRA HEAD WITHOUT CONTRAST TECHNIQUE: Multiplanar, multiecho pulse sequences of the brain and surrounding structures were obtained without intravenous contrast. Angiographic images of the head were obtained using MRA technique without contrast. COMPARISON:  Head CT 08/07/2015 FINDINGS: MRI HEAD FINDINGS The images are mildly to moderately motion degraded despite using faster, more motion resistant protocols. There are multiple small left ACA territory acute infarcts. The largest measures 1.3 cm and involves the corpus callosum anterior to the left frontal horn. There are additional punctate left ACA infarcts involving the left cingulate gyrus and high a medial left frontal lobe. There is also an approximately 2 cm acute infarct in the region of the anteroinferior right thalamus and right  cerebral peduncle. There is mild cytotoxic edema associated with these infarcts without associated mass effect. There is no evidence intracranial hemorrhage, mass, midline shift, or extra-axial fluid collection. Ventricles and sulci  are normal for age. Orbits are unremarkable. Paranasal sinuses and mastoid air cells are clear. Major intracranial vascular flow voids are preserved. MRA HEAD FINDINGS Study is moderately to severely motion degraded. The visualized distal vertebral arteries are patent with the left being dominant. Basilar artery is patent without evidence of significant stenosis. PCAs are grossly patent proximally but are poorly evaluated due to motion artifact. Internal carotid arteries are patent from skullbase to carotid termini. No significant ICA stenosis is identified, although cavernous and supraclinoid segments are suboptimally evaluated due to motion artifact. M1 and A1 segments are patent without evidence of gross stenosis. A2 segments are patent proximally although there is suggestion of segmental narrowing more distally, however evaluation is limited by motion artifact. MCA bifurcations are patent. No gross intracranial aneurysm is identified. IMPRESSION: 1. Multiple small, acute left ACA territory infarcts involving the corpus callosum, cingulate gyrus, and superior left frontal lobe. 2. Small acute infarct involving the anteroinferior right thalamus/cerebral peduncle. 3. Moderately to severely motion degraded head MRA without evidence of large vessel occlusion or flow limiting proximal stenosis. Suggestion of A2 ACA segmental narrowing. Electronically Signed   By: Sebastian Ache M.D.   On: 08/07/2015 14:45    Assessment/Plan: Diagnosis: Multiple small, acute left ACA territory infarcts  Labs and images independently reviewed.  Records reviewed and summated above. Stroke: Continue secondary stroke prophylaxis and Risk Factor Modification listed below:   Antiplatelet therapy Blood Pressure Management:  Continue current medication with prn's with permisive HTN per primary team Statin Agent Diabetes management  1. Does the need for close, 24 hr/day medical supervision in concert with the patient's rehab needs  make it unreasonable for this patient to be served in a less intensive setting? Potentially Co-Morbidities requiring supervision/potential complications: DM (Monitor in accordance with exercise and adjust meds as necessary),HTN (monitor and provide prns in accordance with increased physical exertion and pain) 2. Due to safety, disease management, medication administration and patient education, does the patient require 24 hr/day rehab nursing? Potentially 3. Does the patient require coordinated care of a physician, rehab nurse, PT (1-2 hrs/day, 5 days/week), OT (1-2 hrs/day, 5 days/week) and SLP (1-2 hrs/day, 5 days/week) to address physical and functional deficits in the context of the above medical diagnosis(es)? Potentially Addressing deficits in the following areas: balance, endurance, locomotion, dressing, cognition, language and psychosocial support 4. Can the patient actively participate in an intensive therapy program of at least 3 hrs of therapy per day at least 5 days per week? Yes 5. The potential for patient to make measurable gains while on inpatient rehab is good and fair 6. Anticipated functional outcomes upon discharge from inpatient rehab are modified independent and supervision  with PT, modified independent and supervision with OT, modified independent and supervision with SLP. 7. Estimated rehab length of stay to reach the above functional goals is: 10-12 days. 8. Does the patient have adequate social supports and living environment to accommodate these discharge functional goals? Potentially 9. Anticipated D/C setting: Home 10. Anticipated post D/C treatments: HH therapy and Home excercise program 11. Overall Rehab/Functional Prognosis: good  RECOMMENDATIONS: This patient's condition is appropriate for continued rehabilitative care in the following setting: Provided family can provide support, would recommend home with [facility]. Unfortunately, pt does not meet criteria for IRF.    Patient has agreed  to participate in recommended program. Potentially Note that insurance prior authorization may be required for reimbursement for recommended care.  Comment: Rehab Admissions Coordinator to follow up.  Maryla Morrow, MD 08/09/2015

## 2015-08-09 NOTE — Plan of Care (Signed)
Problem: SLP Cognition Goals Goal: Patient will demonstrate attention to functional Patient will demonstrate attention to functional task with Patient will demonstrate selective attention to basic functional task with moderate cues to redirect.

## 2015-08-09 NOTE — Evaluation (Signed)
Speech Language Pathology Evaluation Patient Details Name: Annette Munoz MRN: [id number] DOB: 05/20/1966 Today's Date: 08/09/2015 Time: [id number] SLP Time Calculation (min) (ACUTE ONLY): 27 min  Problem List:  Patient Active Problem List   Diagnosis Date Noted  . Cerebrovascular accident (CVA) due to thrombosis of cerebral artery (HCC)   . CVA (cerebral infarction) 08/07/2015  . DM (diabetes mellitus), type 2, uncontrolled (HCC) 10/04/2009  . Pure hypercholesterolemia 10/04/2009  . Benign essential HTN 10/04/2009   Past Medical History:  Past Medical History  Diagnosis Date  . Diabetes mellitus without complication [facility](HCC)    Past Surgical History:  Past Surgical History  Procedure Laterality Date  . Cesarean section     HPI:  Annette Munoz is a 49 y.o. female adm 08/07/2015 for evaluation of a several day history of altered mental status with memory difficulties (first noted 10/19). 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 There was also a small infarct right thalamus/cerebral peduncle. PMHx- diabetes mellitus, hypertension, obesity, and hyperlipidemia.    Assessment / Plan / Recommendation Clinical Impression  Cognitive-linguistic evaluation complete including administration of the Cognistat. [facility] for all areas with the exception of memory in which she is severely impaired for storage and retrieval of new information which impacts her awareness and safety with ADLs. Therapeutic intervention provided with patient and daughter included education regarding diagnosis, deficits, and impact of deficits on ADLs, as well as use of compensatory strategies to facilitate improved recall of information. Patient was able to demonstrate improved recall of basic information with maximum cueing and use of spaced retrieval at 1-2 minute intervals. Daughter demonstrating excellent carryover of information taught  including use of cueing to assist mother. Will continue to f/u. Recommend OP f/u after d/c and 24 hour supervision given severity of impairments.     SLP Assessment  Patient needs continued Speech Lanaguage Pathology Services    Follow Up Recommendations  Outpatient SLP    Frequency and Duration min 2x/week  1 week   Pertinent Vitals/Pain Pain Assessment: No/denies pain   SLP Goals  Potential to Achieve Goals (ACUTE ONLY): Good Potential Considerations (ACUTE ONLY): Severity of impairments  SLP Evaluation Prior Functioning  Cognitive/Linguistic Baseline: Within functional limits Type of Home: House  Lives With: Spouse Available Help at Discharge: Family;Friend(s)   Cognition  Overall Cognitive Status: Impaired/Different from baseline Arousal/Alertness: Awake/alert Orientation Level: Oriented to person;Oriented to place;Oriented to time;Disoriented to situation Attention: Selective Selective Attention: Impaired Selective Attention Impairment: Verbal basic (improved with elimination of distractions) Memory: Impaired Memory Impairment: Storage deficit;Retrieval deficit;Decreased recall of new information;Decreased short term memory Decreased Short Term Memory: Verbal basic;Functional basic Awareness: Impaired Awareness Impairment: Intellectual impairment Problem Solving: Appears intact Safety/Judgment: Impaired (because of short term memory deficit)    Comprehension  Auditory Comprehension Overall Auditory Comprehension: Appears within functional limits for tasks assessed Visual Recognition/Discrimination Discrimination: Within Function Limits Reading Comprehension Reading Status: Within funtional limits    Expression Expression Primary Mode of Expression: Verbal Verbal Expression Overall Verbal Expression: Appears within functional limits for tasks assessed Written Expression Dominant Hand: Right   Oral / Motor Oral Motor/Sensory Function Overall Oral Motor/Sensory  Function: Appears within functional limits for tasks assessed Motor Speech Overall Motor Speech: Appears within functional limits for tasks assessed   GO   Ferdinand LangoLeah Sander Remedios MA, CCC-SLP [phone]   Annette Munoz 08/09/2015, 10:27 AM

## 2015-08-09 NOTE — Progress Notes (Signed)
Attending MD note  Patient was seen, examined,treatment plan was discussed with the PA-S.  I have personally reviewed the clinical findings, lab, imaging studies and management of this patient in detail. I agree with the documentation, as recorded by the PA-S.   Embolic CVA-non focal exam-seems to much calmer-answering questions appropriately-TEE/loop scheduled for am. Continue ASA.   [facility]    [facility] PROGRESS NOTE  Annette Munoz:[id number] DOB: [birth year] DOA: 08/07/2015 PCP: No primary care provider on file.  Assessment/Plan: Acute embolic CVA: Presented to the ED with mental status changes x several days. MRI brain shows multiple infarcts involving the left ACA territory and acute infarct in the right thalamus- likely embolic. Telemetry negative for atrial fibrillation. Transthoracic echo- negative for embolic source, Carotid doppler-no significant stenosis. Cardiology has been consulted for TEE/loop recorder. LDL 138 (goal<70)- increased Lipitor to 80 mg. A1C 7.2. Continue on Aspirin. PT evaluated-CIR recommended  Dyslipidemia: see above  DM (diabetes mellitus), type 2, uncontrolled: Continue to hold metformin-continue SSI-CBGs stable  Hypertension: Allow permissive hypertension-continue to hold lisinopril.  Morbid obesity: Encouraged weight loss  Code Status: Full code Family Communication: Daughter at bedside Disposition Plan: Remain inpatient  DVT Prophylaxis: Prophylactic Lovenox  Consultants: Neurology, PT/OT  Procedures:  none  Antibiotics:  See belwo  HPI/Subjective: Annette Munoz is a 49 y.o. Female with a history of diabetes and hypertension who presented to the ED on 08/07/15 with mental status changes. CT was performed and demonstrated possi  Objective: Filed Vitals:   08/09/15 0954  BP: 169/79  Pulse: 62  Temp: 98.9 F (37.2 C)  Resp: 20    Intake/Output Summary (Last 24 hours) at 08/09/15  1145 Last data filed at 08/08/15 1320  Gross per 24 hour  Intake    360 ml  Output      0 ml  Net    360 ml   Filed Weights   08/07/15 1134  Weight: 99.791 kg (220 lb)    Exam:   General:  Awake and alert  Cardiovascular: Regular rate and rhythm,   Respiratory: Normal effort   Abdomen: Soft, non-tender, non distended.  Musculoskeletal: Ne edema, moves all extremities.         Data Reviewed: Basic Metabolic Panel:  Recent Labs Lab 08/07/15 1206  NA 134*  K 3.7  CL 105  CO2 22  GLUCOSE 185*  BUN 12  CREATININE 0.84  CALCIUM 8.6*   Liver Function Tests:  Recent Labs Lab 08/07/15 1206  AST 18  ALT 16  ALKPHOS 45  BILITOT 0.5  PROT 6.3*  ALBUMIN 3.4*   No results for input(s): LIPASE, AMYLASE in the last 168 hours. No results for input(s): AMMONIA in the last 168 hours. CBC:  Recent Labs Lab 08/07/15 1206  WBC 6.6  NEUTROABS 3.6  HGB 10.9*  HCT 33.9*  MCV 94.4  PLT 355   Cardiac Enzymes: No results for input(s): CKTOTAL, CKMB, CKMBINDEX, TROPONINI in the last 168 hours. BNP (last 3 results) No results for input(s): BNP in the last 8760 hours.  ProBNP (last 3 results) No results for input(s): PROBNP in the last 8760 hours.  CBG:  Recent Labs Lab 08/07/15 1149 08/07/15 2136 08/08/15 1149 08/08/15 2102 08/09/15 0631  GLUCAP 164* 188* 169* 208* 193*    No results found for this or any previous visit (from the past 240 hour(s)).   Studies: Ct Head Wo Contrast  08/07/2015  CLINICAL DATA:  Hyperglycemia.  Confusion. EXAM: CT HEAD  WITHOUT CONTRAST TECHNIQUE: Contiguous axial images were obtained from the base of the skull through the vertex without intravenous contrast. COMPARISON:  None. FINDINGS: There is a focal area of low density in the white matter anterior to the frontal horn of the left lateral ventricle. It is probably in the anterior corpus callosum. No midline shift. No hemorrhage. IMPRESSION: Focal low density in the left  frontal lobe white matter which may be in the corpus callosum. Acute infarct is not excluded. Consider MRI to further characterize. Electronically Signed   By: Jolaine Click M.D.   On: 08/07/2015 12:55   Mr Brain Wo Contrast  08/07/2015  CLINICAL DATA:  Altered mental status for 4 days. History of hypertension and diabetes. EXAM: MRI HEAD WITHOUT CONTRAST MRA HEAD WITHOUT CONTRAST TECHNIQUE: Multiplanar, multiecho pulse sequences of the brain and surrounding structures were obtained without intravenous contrast. Angiographic images of the head were obtained using MRA technique without contrast. COMPARISON:  Head CT 08/07/2015 FINDINGS: MRI HEAD FINDINGS The images are mildly to moderately motion degraded despite using faster, more motion resistant protocols. There are multiple small left ACA territory acute infarcts. The largest measures 1.3 cm and involves the corpus callosum anterior to the left frontal horn. There are additional punctate left ACA infarcts involving the left cingulate gyrus and high a medial left frontal lobe. There is also an approximately 2 cm acute infarct in the region of the anteroinferior right thalamus and right cerebral peduncle. There is mild cytotoxic edema associated with these infarcts without associated mass effect. There is no evidence intracranial hemorrhage, mass, midline shift, or extra-axial fluid collection. Ventricles and sulci are normal for age. Orbits are unremarkable. Paranasal sinuses and mastoid air cells are clear. Major intracranial vascular flow voids are preserved. MRA HEAD FINDINGS Study is moderately to severely motion degraded. The visualized distal vertebral arteries are patent with the left being dominant. Basilar artery is patent without evidence of significant stenosis. PCAs are grossly patent proximally but are poorly evaluated due to motion artifact. Internal carotid arteries are patent from skullbase to carotid termini. No significant ICA stenosis is  identified, although cavernous and supraclinoid segments are suboptimally evaluated due to motion artifact. M1 and A1 segments are patent without evidence of gross stenosis. A2 segments are patent proximally although there is suggestion of segmental narrowing more distally, however evaluation is limited by motion artifact. MCA bifurcations are patent. No gross intracranial aneurysm is identified. IMPRESSION: 1. Multiple small, acute left ACA territory infarcts involving the corpus callosum, cingulate gyrus, and superior left frontal lobe. 2. Small acute infarct involving the anteroinferior right thalamus/cerebral peduncle. 3. Moderately to severely motion degraded head MRA without evidence of large vessel occlusion or flow limiting proximal stenosis. Suggestion of A2 ACA segmental narrowing. Electronically Signed   By: Sebastian Ache M.D.   On: 08/07/2015 14:45   Mr [name] Head/brain Wo Cm  08/07/2015  CLINICAL DATA:  Altered mental status for 4 days. History of hypertension and diabetes. EXAM: MRI HEAD WITHOUT CONTRAST MRA HEAD WITHOUT CONTRAST TECHNIQUE: Multiplanar, multiecho pulse sequences of the brain and surrounding structures were obtained without intravenous contrast. Angiographic images of the head were obtained using MRA technique without contrast. COMPARISON:  Head CT 08/07/2015 FINDINGS: MRI HEAD FINDINGS The images are mildly to moderately motion degraded despite using faster, more motion resistant protocols. There are multiple small left ACA territory acute infarcts. The largest measures 1.3 cm and involves the corpus callosum anterior to the left frontal horn. There are additional punctate  left ACA infarcts involving the left cingulate gyrus and high a medial left frontal lobe. There is also an approximately 2 cm acute infarct in the region of the anteroinferior right thalamus and right cerebral peduncle. There is mild cytotoxic edema associated with these infarcts without associated mass effect.  There is no evidence intracranial hemorrhage, mass, midline shift, or extra-axial fluid collection. Ventricles and sulci are normal for age. Orbits are unremarkable. Paranasal sinuses and mastoid air cells are clear. Major intracranial vascular flow voids are preserved. MRA HEAD FINDINGS Study is moderately to severely motion degraded. The visualized distal vertebral arteries are patent with the left being dominant. Basilar artery is patent without evidence of significant stenosis. PCAs are grossly patent proximally but are poorly evaluated due to motion artifact. Internal carotid arteries are patent from skullbase to carotid termini. No significant ICA stenosis is identified, although cavernous and supraclinoid segments are suboptimally evaluated due to motion artifact. M1 and A1 segments are patent without evidence of gross stenosis. A2 segments are patent proximally although there is suggestion of segmental narrowing more distally, however evaluation is limited by motion artifact. MCA bifurcations are patent. No gross intracranial aneurysm is identified. IMPRESSION: 1. Multiple small, acute left ACA territory infarcts involving the corpus callosum, cingulate gyrus, and superior left frontal lobe. 2. Small acute infarct involving the anteroinferior right thalamus/cerebral peduncle. 3. Moderately to severely motion degraded head MRA without evidence of large vessel occlusion or flow limiting proximal stenosis. Suggestion of A2 ACA segmental narrowing. Electronically Signed   By: Sebastian AcheAllen  Grady M.D.   On: 08/07/2015 14:45    Scheduled Meds: . aspirin  325 mg Oral Daily  . atorvastatin  80 mg Oral q1800  . enoxaparin (LOVENOX) injection  40 mg Subcutaneous Q24H  . insulin aspart  0-9 Units Subcutaneous TID WC   Continuous Infusions:   Principal Problem:   CVA (cerebral infarction) Active Problems:   DM (diabetes mellitus), type 2, uncontrolled (HCC)   Pure hypercholesterolemia   Benign essential HTN    Cerebrovascular accident (CVA) due to thrombosis of cerebral artery (HCC)  Time spent: 60 minutes  Edgar FriskJohnson, Bree  [facility]  If 7PM-7AM, please contact night-coverage at [email or web address], password [email or web address] 08/09/2015, 11:45 AM  LOS: 2 days

## 2015-08-09 NOTE — Progress Notes (Signed)
Rehab admissions - Evaluated for possible admission.  Please see rehab consult done today recommending HH.  Doing too well to meet criteria for acute inpatient rehab admission.  May need SNF if lacks family support.  #[phone]

## 2015-08-09 NOTE — Progress Notes (Signed)
STROKE TEAM PROGRESS NOTE   HISTORY Annette Munoz is a 49 y.o. female with a history of diabetes mellitus, hypertension, obesity, and hyperlipidemia, who was [facility] on 08/07/2015 for evaluation of a several day history of altered mental status with memory difficulties. In the emergency department a CT scan of the head was performed that was consistent with a possible left frontal lobe infarct. An MRI revealed multiple small acute left ACA territory infarcts involving the corpus callosum, cingulate gyrus, and superior left frontal lobe. There was also a small infarct involving the anterior-inferior right thalamus/cerebral peduncle.   The patient's husband stated that he came home from work Wednesday evening and noted the patient be somewhat confused at that time. She had difficulty remembering details about their son's football game. 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 EMS was summoned; however, the patient declined evaluation. Today the patient continued to demonstrate odd behavior and her husband brought her to the emergency department for further evaluation. She has a strange affect which her husband notes is not her usual demeanor. She denies drug or alcohol use. She does not take aspirin on a regular basis. She denies palpitations, racing heart rate, or irregular heart rate.  Date last known well: Date: 08/04/2015 Time last known well: Unable to determine tPA Given: No: - Time of presentation was outside the therapeutic window - deficits mild.      SUBJECTIVE (INTERVAL HISTORY) The patient's daughter is at the bedside. The patient is alert and without complaints. She continues to appear somewhat restless with an odd affect.    OBJECTIVE Temp:  [98.1 F (36.7 C)-99.2 F (37.3 C)] 98.1 F (36.7 C) (10/24 1350) Pulse Rate:  [62-84] 63 (10/24 1350) Cardiac Rhythm:  [-] Normal sinus  rhythm (10/24 0700) Resp:  [18-20] 20 (10/24 1350) BP: (122-172)/(58-79) 163/76 mmHg (10/24 1350) SpO2:  [94 %-100 %] 95 % (10/24 1350)  CBC:   Recent Labs Lab 08/07/15 1206  WBC 6.6  NEUTROABS 3.6  HGB 10.9*  HCT 33.9*  MCV 94.4  PLT 355    Basic Metabolic Panel:   Recent Labs Lab 08/07/15 1206  NA 134*  K 3.7  CL 105  CO2 22  GLUCOSE 185*  BUN 12  CREATININE 0.84  CALCIUM 8.6*    Lipid Panel:     Component Value Date/Time   CHOL 206* 08/08/2015 0545   TRIG 126 08/08/2015 0545   HDL 43 08/08/2015 0545   CHOLHDL 4.8 08/08/2015 0545   VLDL 25 08/08/2015 0545   LDLCALC 138* 08/08/2015 0545   HgbA1c:  Lab Results  Component Value Date   HGBA1C 7.2* 08/08/2015   Urine Drug Screen:     Component Value Date/Time   LABOPIA NONE DETECTED 08/07/2015 1754   COCAINSCRNUR NONE DETECTED 08/07/2015 1754   LABBENZ NONE DETECTED 08/07/2015 1754   AMPHETMU NONE DETECTED 08/07/2015 1754   THCU NONE DETECTED 08/07/2015 1754   LABBARB NONE DETECTED 08/07/2015 1754      IMAGING  Ct Head Wo Contrast 08/07/2015   Focal low density in the left frontal lobe white matter which may be in the corpus callosum. Acute infarct is not excluded.     Mr Maxine GlennMra Head/brain Wo Cm 08/07/2015   1. Multiple small, acute left ACA territory infarcts involving the corpus callosum, cingulate gyrus, and superior left frontal lobe.  2. Small acute infarct involving the anteroinferior right  thalamus/cerebral peduncle.  3. Moderately to severely motion degraded head MRA without evidence of large vessel occlusion or flow limiting proximal stenosis. Suggestion of A2 ACA segmental narrowing.    PHYSICAL EXAM  Mental Status: Alert, oriented, thought content appropriate.Somewhat restless with an odd affect. Diminished attention, registration and recall. Speech fluent without evidence of aphasia. Able to follow 3 step commands without difficulty. Cranial Nerves: II: Visual fields grossly  normal, pupils equal, round, reactive to light and accommodation III,IV, VI: ptosis not present, extra-ocular motions intact bilaterally V,VII: smile symmetric, facial light touch sensation normal bilaterally VIII: hearing normal bilaterally IX,X: gag reflex present XI: bilateral shoulder shrug XII: midline tongue extension Motor: Right :Upper extremity 5/5Left: Upper extremity 5/5 Lower extremity 5/5Lower extremity 5/5 Tone and bulk:normal tone throughout; no atrophy noted Sensory: Light touch intact throughout, bilaterally Deep Tendon Reflexes: 1+ and symmetric throughout Plantars: Right: downgoingLeft: downgoing Cerebellar: normal finger-to-nose    ASSESSMENT/PLAN Annette Munoz is a 49 y.o. female with history of diabetes mellitus, hypertension, obesity, and hyperlipidemia, presenting with altered mental status and memory difficulties.. She did not receive IV t-PA due to late presentation and mild deficits.  Stroke:  Bilateral strokes probably embolic from an unknown source.  Resultant  altered mental status and memory deficits  MRI - multiple bilateral small infarcts as noted above.  MRA - motion degraded with no large vessel occlusion or stenosis.  Carotid Doppler - 1-39% ICA plaquing. Vertebral artery flow is antegrade.  2D Echo There was mild concentric hypertrophy. Systolic function was normal. The estimated ejection fraction was in the range of 55% to 60%. Wall motion was normal; there were no regional wall motion abnormalities.  LDL 138  HgbA1c 7.2  VTE prophylaxis - Lovenox Diet heart healthy/carb modified Room service appropriate?: Yes; Fluid consistency:: Thin  No antithrombotic prior to admission, now on aspirin 325 mg daily  Patient counseled to be compliant with her antithrombotic  medications  Ongoing aggressive stroke risk factor management  Therapy recommendations:SNF vs HH  Disposition: Pending  Hypertension  Labile blood pressures Permissive hypertension (OK if < 220/120) but gradually normalize in 5-7 days  Hyperlipidemia  Home meds: Lipitor 20 mg daily prior to admission changed to 80 mg daily.  LDL 138, goal < 70  Lipitor 20 mg daily prior to admission changed to 80 mg daily.  Continue statin at discharge  Diabetes  HgbA1c 7.2, goal < 7.0  Uncontrolled  Other Stroke Risk Factors  ETOH use  Obesity, Body mass index is 40.23 kg/(m^2).    Other Active Problems  Anemia   Plan  TEE - tentatively planned for today .Discussed with daughter and answered questions..  Aspirin therapy  Hospital day # 2      08/09/2015, 4:02 PM  Yamilee Harmes     To contact Stroke Continuity provider, please refer to [email or web address]. After hours, contact General Neurology

## 2015-08-09 NOTE — Progress Notes (Signed)
Physical Therapy Treatment Patient Details Name: Annette Munoz MRN: [id number] DOB: 1966-05-14 Today's Date: 08/09/2015    History of Present Illness Annette Munoz is a 49 y.o. female adm 08/07/2015 for evaluation of a several day history of altered mental status with memory difficulties (first noted 10/19).  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 There was also a small infarct right thalamus/cerebral peduncle. PMHx- diabetes mellitus, hypertension, obesity, and hyperlipidemia    PT Comments    Pt progressing towards physical therapy goals. Continues to demonstrate significant safety awareness deficits. Attempted cognitive activity during gait training and pt was not able to safely dual-task. Throughout session, pt maintains that she is "fine", states that she does not believe she has had a stroke, and that she has no deficits. Pt with obvious short term memory deficits, however attempts to "play it off" when she does not remember something. Do not feel this pt is safe to be at home alone at this time, and it appears that she would only have intermittent support at home at this time. Will continue to follow and progress as able per POC.   Follow Up Recommendations  SNF;Supervision/Assistance - 24 hour     Equipment Recommendations  Other (comment) (TBA)    Recommendations for Other Services       Precautions / Restrictions Precautions Precautions: Fall Restrictions Weight Bearing Restrictions: No    Mobility  Bed Mobility Overal bed mobility: Needs Assistance Bed Mobility: Supine to Sit     Supine to sit: Supervision     General bed mobility comments: No assist required. Pt sleeping when PT entered and required several cues before she initiated transfer to sitting.   Transfers Overall transfer level: Needs assistance Equipment used: None Transfers: Sit to/from Stand Sit to Stand: Supervision         General transfer comment: Supervision for safety. Grossly steady with no LOB noted.   Ambulation/Gait Ambulation/Gait assistance: Min guard Ambulation Distance (Feet): 500 Feet Assistive device: None Gait Pattern/deviations: Step-through pattern;Decreased stride length;Drifts right/left;Wide base of support Gait velocity: WNL; able to vary up/down Gait velocity interpretation: at or above normal speed for age/gender General Gait Details: Slight drifting with head turns and distractions. Pt with increased difficulty with cognitive challenges while walking. Pt unable to attempt counting backwards by 7's, and had increased drifting when asked to name states.   Stairs            Wheelchair Mobility    Modified Rankin (Stroke Patients Only) Modified Rankin (Stroke Patients Only) Pre-Morbid Rankin Score: No symptoms Modified Rankin: Moderate disability     Balance Overall balance assessment: Needs assistance Sitting-balance support: Feet supported;No upper extremity supported Sitting balance-Leahy Scale: Fair     Standing balance support: No upper extremity supported;During functional activity Standing balance-Leahy Scale: Fair                      Cognition Arousal/Alertness: Awake/alert Behavior During Therapy: WFL for tasks assessed/performed Overall Cognitive Status: Impaired/Different from baseline Area of Impairment: Memory;Safety/judgement;Awareness   Current Attention Level: Selective Memory: Decreased short-term memory;Decreased recall of precautions Following Commands: Follows one step commands consistently;Follows one step commands with increased time Safety/Judgement: Decreased awareness of safety;Decreased awareness of deficits Awareness: Emergent   General Comments: Pt is impulsive and disinhibited.  She initially states she has no deficits, but after performing path finding activities, and with trial and error, she stated "my memory really  is  gone, isn't it"  Pt requires mod cues for memory deficits, but demonstrates good problem solving skills    Exercises General Exercises - Lower Extremity Quad Sets: 10 reps Long Arc Quad: 10 reps Straight Leg Raises: 10 reps    General Comments        Pertinent Vitals/Pain Pain Assessment: No/denies pain    Home Living Family/patient expects to be discharged to:: Private residence Living Arrangements: Spouse/significant other;Children (48 y.o. son) Available Help at Discharge: Family;Friend(s);Available PRN/intermittently Type of Home: House Home Access: Stairs to enter Entrance Stairs-Rails: Right;Left Home Layout: One level Home Equipment: None Additional Comments: daughter [place] Consulting civil [place]student [place], spouse works during the day.     Prior Function Level of Independence: Independent      Comments: hairdresser   PT Goals (current goals can now be found in the care plan section) Acute Rehab PT Goals Patient Stated Goal: unable to state PT Goal Formulation: With patient/family Time For Goal Achievement: 08/15/15 Potential to Achieve Goals: Good Progress towards PT goals: Progressing toward goals    Frequency  Min 4X/week    PT Plan Discharge plan needs to be updated    Co-evaluation             End of Session Equipment Utilized During Treatment: Gait belt Activity Tolerance: Patient tolerated treatment well Patient left: in bed;with call bell/phone within reach;with bed alarm set;with family/visitor present     Time: [id number] PT Time Calculation (min) (ACUTE ONLY): 13 min  Charges:  $Gait Training: 8-22 mins                    G Codes:      Conni SlipperKirkman, Hamilton Marinello 08/09/2015, 1:15 PM   Conni SlipperLaura Antionne Enrique, PT, DPT Acute Rehabilitation Services Pager: [phone]

## 2015-08-09 NOTE — Progress Notes (Signed)
Rehab Admissions Coordinator Note:  Patient was screened by Trish MageLogue, Mihika Surrette M for appropriateness for an Inpatient Acute Rehab Consult.  At this time, we are [facility].  Patient is already doing too well to meet criteria for acute inpatient rehab admission.  Call me for questions.  Trish MageLogue, Chanin Frumkin M 08/09/2015, 8:58 AM  I can be reached at [phone].

## 2015-08-10 ENCOUNTER — Ambulatory Visit (HOSPITAL_COMMUNITY): Payer: Commercial Managed Care - HMO

## 2015-08-10 ENCOUNTER — Inpatient Hospital Stay (HOSPITAL_COMMUNITY): Payer: Commercial Managed Care - HMO

## 2015-08-10 ENCOUNTER — Encounter (HOSPITAL_COMMUNITY): Payer: Self-pay | Admitting: Internal Medicine

## 2015-08-10 ENCOUNTER — Encounter (HOSPITAL_COMMUNITY)
Admission: EM | Disposition: A | Discharge status: Home / Self Care | Payer: Self-pay | Source: Home / Self Care | Attending: Internal Medicine

## 2015-08-10 DIAGNOSIS — E785 Hyperlipidemia, unspecified: Secondary | ICD-10-CM | POA: Insufficient documentation

## 2015-08-10 DIAGNOSIS — I639 Cerebral infarction, unspecified: Secondary | ICD-10-CM

## 2015-08-10 DIAGNOSIS — I63422 Cerebral infarction due to embolism of left anterior cerebral artery: Principal | ICD-10-CM

## 2015-08-10 DIAGNOSIS — I10 Essential (primary) hypertension: Secondary | ICD-10-CM | POA: Insufficient documentation

## 2015-08-10 DIAGNOSIS — I63431 Cerebral infarction due to embolism of right posterior cerebral artery: Secondary | ICD-10-CM

## 2015-08-10 HISTORY — PX: TEE WITHOUT CARDIOVERSION: SHX5443

## 2015-08-10 LAB — GLUCOSE, CAPILLARY
GLUCOSE-CAPILLARY: 153 mg/dL — AB (ref 65–99)
GLUCOSE-CAPILLARY: 132 mg/dL — AB (ref 65–99)
Glucose-Capillary: 203 mg/dL — ABNORMAL HIGH (ref 65–99)
Glucose-Capillary: 246 mg/dL — ABNORMAL HIGH (ref 65–99)

## 2015-08-10 LAB — ANTITHROMBIN III: AntiThromb III Func: 103 % (ref 75–120)

## 2015-08-10 LAB — SEDIMENTATION RATE: Sed Rate: 46 mm/hr — ABNORMAL HIGH (ref 0–22)

## 2015-08-10 SURGERY — ECHOCARDIOGRAM, TRANSESOPHAGEAL
Anesthesia: Moderate Sedation

## 2015-08-10 MED ORDER — BUTAMBEN-TETRACAINE-BENZOCAINE 2-2-14 % EX AERO
INHALATION_SPRAY | CUTANEOUS | Status: DC | PRN
  Administered 2015-08-10: 2 via TOPICAL

## 2015-08-10 MED ORDER — FENTANYL CITRATE (PF) 100 MCG/2ML IJ SOLN
INTRAMUSCULAR | Status: AC
  Filled 2015-08-10: qty 2

## 2015-08-10 MED ORDER — MIDAZOLAM HCL 5 MG/ML IJ SOLN
INTRAMUSCULAR | Status: AC
  Filled 2015-08-10: qty 2

## 2015-08-10 MED ORDER — DIPHENHYDRAMINE HCL 50 MG/ML IJ SOLN
INTRAMUSCULAR | Status: AC
  Filled 2015-08-10: qty 1

## 2015-08-10 MED ORDER — FENTANYL CITRATE (PF) 100 MCG/2ML IJ SOLN
INTRAMUSCULAR | Status: DC | PRN
  Administered 2015-08-10 (×3): 25 ug via INTRAVENOUS

## 2015-08-10 MED ORDER — BLISTEX MEDICATED EX OINT
TOPICAL_OINTMENT | CUTANEOUS | Status: DC | PRN
  Filled 2015-08-10: qty 10

## 2015-08-10 MED ORDER — LIDOCAINE VISCOUS 2 % MT SOLN
OROMUCOSAL | Status: DC | PRN
  Administered 2015-08-10: 1 via OROMUCOSAL

## 2015-08-10 MED ORDER — MIDAZOLAM HCL 10 MG/2ML IJ SOLN
INTRAMUSCULAR | Status: DC | PRN
  Administered 2015-08-10: 1 mg via INTRAVENOUS
  Administered 2015-08-10: 2 mg via INTRAVENOUS
  Administered 2015-08-10: 1 mg via INTRAVENOUS

## 2015-08-10 MED ORDER — LIDOCAINE VISCOUS 2 % MT SOLN
OROMUCOSAL | Status: AC
  Filled 2015-08-10: qty 15

## 2015-08-10 MED ORDER — IOHEXOL 350 MG/ML SOLN
50.0000 mL | Freq: Once | INTRAVENOUS | Status: AC | PRN
  Administered 2015-08-10: 50 mL via INTRAVENOUS

## 2015-08-10 NOTE — Progress Notes (Signed)
Inpatient Diabetes Program Recommendations  AACE/ADA: New Consensus Statement on Inpatient Glycemic Control (2015)  Target Ranges:  Prepandial:   less than 140 mg/dL      Peak postprandial:   less than 180 mg/dL (1-2 hours)      Critically ill patients:  140 - 180 mg/dL   Review of Glycemic Control:  Results for Salena SanerYOURSE, Levora H (MRN [id number]) as of 08/10/2015 12:53  Ref. Range 08/09/2015 11:43 08/09/2015 16:25 08/09/2015 21:39 08/10/2015 06:43 08/10/2015 11:23  Glucose-Capillary Latest Ref Range: 65-99 mg/dL 952142 (H) 841158 (H) 324195 (H) 203 (H) 153 (H)  Results for Salena SanerYOURSE, Tonye H (MRN [id number]) as of 08/10/2015 12:53  Ref. Range 08/08/2015 05:45  Hemoglobin A1C Latest Ref Range: 4.8-5.6 % 7.2 (H)    Diabetes history: Type 2 diabetes Outpatient Diabetes medications: Metformin 1000 mg bid Current orders for Inpatient glycemic control:  Novolog sensitive tid with meals  Inpatient Diabetes Program Recommendations:     While in the hospital, patient may benefit adding Levemir 20 units daily.  She will likely not need insulin at home based on A1C.    Thanks, Beryl MeagerJenny Bo Teicher, RN, BC-ADM Inpatient Diabetes Coordinator Pager [phone]

## 2015-08-10 NOTE — H&P (Signed)
     INTERVAL PROCEDURE H&P  History and Physical Interval Note:  08/10/2015 1:02 PM  Annette Munoz has presented today for their planned procedure. The various methods of treatment have been discussed with the patient and family. After consideration of risks, benefits and other options for treatment, the patient has consented to the procedure.  The patients' outpatient history has been reviewed, patient examined, and no change in status from most recent office note within the past 30 days. I have reviewed the patients' chart and labs and will proceed as planned. Questions were answered to the patient's satisfaction.   Chrystie NoseKenneth C. Hilty, MD, [facility] CenterFACC Attending Cardiologist [facility]  Annette Munoz 08/10/2015, 1:02 PM

## 2015-08-10 NOTE — Progress Notes (Signed)
*  PRELIMINARY RESULTS* Vascular Ultrasound Lower extremity venous duplexd has been completed.  Preliminary findings: negative for DVT  Farrel DemarkJill Eunice, RDMS, RVT  08/10/2015, 10:02 AM

## 2015-08-10 NOTE — Progress Notes (Signed)
  Echocardiogram Echocardiogram Transesophageal has been performed.  Janalyn HarderWest, Winston Misner R 08/10/2015, 2:46 PM

## 2015-08-10 NOTE — Care Management Note (Addendum)
Case Management Note  Patient Details  Name: Annette Munoz MRN: [id number] Date of Birth: 03/28/1966  Subjective/Objective:                    Action/Plan: Patient admitted with CVA. Patient lives at home with family. PT/OT recommending SNF however patient is ambulating 500 feet and will not qualify for SNF with insurance. CM spoke with the patient, daughter and patient's mother and went over the plan of patient being discharged to home with home health services. Family seems receptive but tearful. CM provided the daughter with [place]. CM also provided a list of private duty agencies to assist with care at home. Family notified that private duty aides are not covered under insurance and would be private pay. Patient's daughter asked to go over the list with her father when he arrives at the hospital and then CM will revisit to discuss options with patient's husband present. No PCP noted on patient's facesheet. CM inquired about a PCP and patient states she sees [name] at [facility]. CM will continue to follow.  Expected Discharge Date:                  Expected Discharge Plan:  Home w Home Health Services  In-House Referral:     Discharge planning Services  CM Consult  Post Acute Care Choice:    Choice offered to:     DME Arranged:    DME Agency:     HH Arranged:  PT, OT, Nurse's Aide, Speech Therapy HH Agency:     Status of Service:  In process, will continue to follow  Medicare Important Message Given:    Date Medicare IM Given:    Medicare IM give by:    Date Additional Medicare IM Given:    Additional Medicare Important Message give by:     If discussed at Long Length of Stay Meetings, dates discussed:    Additional Comments:  Kermit BaloKelli F Mekel Haverstock, RN 08/10/2015, 10:57 AM

## 2015-08-10 NOTE — Progress Notes (Signed)
[facility] PROGRESS NOTE  KYANNE RIALS ZDG:[id number] DOB: 1965/11/24 DOA: 08/07/2015 PCP: No primary care provider on file.   Attending MD note  Patient was seen, examined,treatment plan was discussed with the PA-S.  I have personally reviewed the clinical findings, lab, imaging studies and management of this patient in detail. I agree with the documentation, as recorded by the PA-S.   Doing well-nonfocal exam-awaiting TEE/loop recorder. Evaluated by CIR-felt to be too high functioning-home health services plan on discharge. Await further recommendations from neurology.   [facility]   Assessment/Plan: Acute embolic CVA: Presented to the ED with mental status changes x several days. MRI brain shows multiple infarcts involving the left ACA territory and acute infarct in the right thalamus- likely embolic. Telemetry negative for atrial fibrillation. Transthoracic echo- negative for embolic source, Carotid doppler-no significant stenosis. 2-D echo-mild hypetrophy, ejection fraction 55%-60%. Lower extremity venous duplex - negative for DVT. Cardiology has been consulted for TEE, planned for today. LDL 138 (goal<70)- increased Lipitor to 80 mg. A1C 7.2. Continue on Aspirin. PT evaluated-CIR recommended-however felt to be too high functioning for CIR-home health services on discharge  Dyslipidemia: see above  DM (diabetes mellitus), type 2, uncontrolled: Continue to hold metformin-continue SSI-CBGs stable  Hypertension: Allow permissive hypertension-continue to hold lisinopril.  Morbid obesity: Encouraged weight loss  Code Status: Full code Family Communication: Mother at bedside Disposition Plan: Remain inpatient  DVT Prophylaxis: Prophylactic Lovenox   Consultants:  Neurology  Procedures:  none  Antibiotics:  See below  HPI/Subjective: FELICIANA Munoz is a 49 y.o. Female with a history of diabetes and hypertension who presented to the ED on  08/07/15 with mental status changes. CT was performed and demonstrated possible left frontal lobe infarct. MRI showed multiple small acute left ACA territory infarcts and a small infarct involving  The anterior- inferior right thalamus/cerebral peduncle. She has some short term memory loss. Objective: Filed Vitals:   08/10/15 1003  BP: 174/78  Pulse: 57  Temp: 98.3 F (36.8 C)  Resp: 20    Intake/Output Summary (Last 24 hours) at 08/10/15 1037 Last data filed at 08/09/15 1900  Gross per 24 hour  Intake    360 ml  Output      0 ml  Net    360 ml   Filed Weights   08/07/15 1134  Weight: 99.791 kg (220 lb)    Exam:   General:  Awake and alert  Cardiovascular: S1,S2 Regular, no murmurs.  Respiratory: Normal effort, clear to auscultation anteriorly  Abdomen: Soft, non-tender, non-distended.  Musculoskeletal: No edema, normal strength.  Neurology: Nonfocal exam  Data Reviewed: Basic Metabolic Panel:  Recent Labs Lab 08/07/15 1206  NA 134*  K 3.7  CL 105  CO2 22  GLUCOSE 185*  BUN 12  CREATININE 0.84  CALCIUM 8.6*   Liver Function Tests:  Recent Labs Lab 08/07/15 1206  AST 18  ALT 16  ALKPHOS 45  BILITOT 0.5  PROT 6.3*  ALBUMIN 3.4*   No results for input(s): LIPASE, AMYLASE in the last 168 hours. No results for input(s): AMMONIA in the last 168 hours. CBC:  Recent Labs Lab 08/07/15 1206  WBC 6.6  NEUTROABS 3.6  HGB 10.9*  HCT 33.9*  MCV 94.4  PLT 355   Cardiac Enzymes: No results for input(s): CKTOTAL, CKMB, CKMBINDEX, TROPONINI in the last 168 hours. BNP (last 3 results) No results for input(s): BNP in the last 8760 hours.  ProBNP (last 3 results) No results for  input(s): PROBNP in the last 8760 hours.  CBG:  Recent Labs Lab 08/09/15 0631 08/09/15 1143 08/09/15 1625 08/09/15 2139 08/10/15 0643  GLUCAP 193* 142* 158* 195* 203*    No results found for this or any previous visit (from the past 240 hour(s)).   Studies: No  results found.  Scheduled Meds: . aspirin  325 mg Oral Daily  . atorvastatin  80 mg Oral q1800  . enoxaparin (LOVENOX) injection  40 mg Subcutaneous Q24H  . insulin aspart  0-9 Units Subcutaneous TID WC   Continuous Infusions:   Principal Problem:   CVA (cerebral infarction) Active Problems:   DM (diabetes mellitus), type 2, uncontrolled (HCC)   Pure hypercholesterolemia   Benign essential HTN   Cerebrovascular accident (CVA) due to thrombosis of cerebral artery (HCC)    Time spent: 45 minutes    Edgar FriskJohnson, Bree  PA-S [facility]  If 7PM-7AM, please contact night-coverage at [email or web address], password [email or web address] 08/10/2015, 10:37 AM  LOS: 3 days

## 2015-08-10 NOTE — Progress Notes (Signed)
STROKE TEAM PROGRESS NOTE   SUBJECTIVE (INTERVAL HISTORY) Multiple family members are at the bedside. Per family, pt works at an [employer] on [address] and she fell. On Thurs when she went to work, she complained of L sided pain, unable to walk well as ankle swollen and left sided bruising. That night she "was not her self" very animated. On Friday, she was inappropriate at work with poor short term memory. Patient does not remember events shared by family.   OBJECTIVE Temp:  [98 F (36.7 C)-99.1 F (37.3 C)] 98.3 F (36.8 C) (10/25 1003) Pulse Rate:  [57-75] 57 (10/25 1003) Cardiac Rhythm:  [-] Normal sinus rhythm (10/25 0700) Resp:  [18-20] 20 (10/25 1003) BP: (115-174)/(54-88) 174/78 mmHg (10/25 1003) SpO2:  [95 %-98 %] 98 % (10/25 1003)  CBC:   Recent Labs Lab 08/07/15 1206  WBC 6.6  NEUTROABS 3.6  HGB 10.9*  HCT 33.9*  MCV 94.4  PLT 099   Basic Metabolic Panel:   Recent Labs Lab 08/07/15 1206  NA 134*  K 3.7  CL 105  CO2 22  GLUCOSE 185*  BUN 12  CREATININE 0.84  CALCIUM 8.6*   Lipid Panel:     Component Value Date/Time   CHOL 206* 08/08/2015 0545   TRIG 126 08/08/2015 0545   HDL 43 08/08/2015 0545   CHOLHDL 4.8 08/08/2015 0545   VLDL 25 08/08/2015 0545   LDLCALC 138* 08/08/2015 0545   HgbA1c:  Lab Results  Component Value Date   HGBA1C 7.2* 08/08/2015   Urine Drug Screen:     Component Value Date/Time   LABOPIA NONE DETECTED 08/07/2015 1754   COCAINSCRNUR NONE DETECTED 08/07/2015 1754   LABBENZ NONE DETECTED 08/07/2015 1754   AMPHETMU NONE DETECTED 08/07/2015 1754   THCU NONE DETECTED 08/07/2015 1754   LABBARB NONE DETECTED 08/07/2015 1754      IMAGING I have personally reviewed the radiological images below and agree with the radiology interpretations.  Ct Head Wo Contrast 08/07/2015   Focal low density in the left frontal lobe white matter which may be in the corpus callosum. Acute infarct is not excluded.    Mr Jodene Nam Head/brain Wo  Cm 08/07/2015   1. Multiple small, acute left ACA territory infarcts involving the corpus callosum, cingulate gyrus, and superior left frontal lobe.  2. Small acute infarct involving the anteroinferior right thalamus/cerebral peduncle.  3. Moderately to severely motion degraded head MRA without evidence of large vessel occlusion or flow limiting proximal stenosis. Suggestion of A2 ACA segmental narrowing.   Carotid Doppler 1-39% ICA plaquing. Vertebral artery flow is antegrade.  2D Echocardiogram   - Left ventricle: The cavity size was normal. There was mildconcentric hypertrophy. Systolic function was normal. Theestimated ejection fraction was in the range of 55% to 60%. Wallmotion was normal; there were no regional wall motionabnormalities. - Left atrium: The atrium was mildly dilated. - Atrial septum: No defect or patent foramen ovale was identified.  LE venous dopplers negative for DVT  TEE  1. No LAA thrombus 2. Negative for PFO 3. No significant valvular disease 4. LVEF 55-60%   PHYSICAL EXAM  Temp:  [97.1 F (36.2 C)-99.1 F (37.3 C)] 97.8 F (36.6 C) (10/25 1439) Pulse Rate:  [54-77] 62 (10/25 1450) Resp:  [11-21] 16 (10/25 1450) BP: (115-215)/(54-109) 198/86 mmHg (10/25 1450) SpO2:  [93 %-100 %] 93 % (10/25 1450) Weight:  [220 lb (99.791 kg)] 220 lb (99.791 kg) (10/25 1319)  General - Well nourished, well developed, in no  apparent distress.  Ophthalmologic - Sharp disc margins OU.  Cardiovascular - Regular rate and rhythm with no murmur.  Mental Status -  Level of arousal and orientation to time, place, and person were intact. Language including expression, naming, repetition, comprehension was assessed and found intact. Attention span and concentration were normal. Recent and remote memory were 3/3 registration but 0/3 delayed recall, with cuing 2/3. Fund of Knowledge was assessed and was impaired.  Cranial Nerves II - XII - II - Visual field intact  OU. III, IV, VI - Extraocular movements intact. V - Facial sensation intact bilaterally. VII - Facial movement intact bilaterally. VIII - Hearing & vestibular intact bilaterally. X - Palate elevates symmetrically. XI - Chin turning & shoulder shrug intact bilaterally. XII - Tongue protrusion intact.  Motor Strength - The patient's strength was normal in all extremities and pronator drift was absent.  Bulk was normal and fasciculations were absent.   Motor Tone - Muscle tone was assessed at the neck and appendages and was normal.  Reflexes - The patient's reflexes were 1+ in all extremities and she had no pathological reflexes.  Sensory - Light touch, temperature/pinprick, vibration and proprioception, and Romberg testing were assessed and were symmetrical.    Coordination - The patient had normal movements in the hands and feet with no ataxia or dysmetria.  Tremor was absent.  Gait and Station - not tested due to safety concerns.   ASSESSMENT/PLAN [name] is a 49 y.o. female with history of diabetes mellitus, hypertension, obesity, and hyperlipidemia, presenting with altered mental status, and amnesia. She did not receive IV t-PA due to late presentation and mild deficits.  Stroke:  Bilateral strokes probably embolic from an unknown source. CNS vasculitis is in the DDx, however, pt denies HA history, AMS and amnesia with acute onset, making CNS vasculitis less likely, but will do LP and CTA. Hold off cerebral angio at this time.  Resultant  Amnesia and impaired delayed recall  MRI - left ACA infarcts and right thalamus infarcts  MRA - motion degraded with no large vessel occlusion or stenosis.  CTA head -   Carotid Doppler No significant stenosis   2D Echo  No source of embolus   Venous doppler - negative for DVT  TEE negative for SOE, no PFO  LP - pending   LDL 138  HgbA1c 7.2 Given her young age, will check hypercoagulable tests (lupus anticoagulant,  cardiolipin antibody, prothrombin G20120A, homocysteine) and vasculitic labs (C3, C4, CH50, ANA, ESR, ANCA screen w/ reflex titer, Sjogren's Syndrome Antibodies [SSA & SSB]), DSDNA as well as HIV and RPR as possible stroke sources.  VTE prophylaxis - Lovenox Diet NPO time specified  No antithrombotic prior to admission, now on aspirin 325 mg daily.   Patient counseled to be compliant with her antithrombotic medications  Ongoing aggressive stroke risk factor management  Therapy recommendations: SNF vs HH. Too high level for CIR  Disposition: pending   Hypertension  Labile blood pressures Permissive hypertension (OK if < 220/120) but gradually normalize in 5-7 days  Hyperlipidemia  Home meds: Lipitor 20 mg daily prior to admission changed to 80 mg daily.  LDL 138, goal < 70  Lipitor 20 mg daily prior to admission changed to 80 mg daily.  Continue statin at discharge  Diabetes  HgbA1c 7.2, goal < 7.0  Uncontrolled  SSI  Other Stroke Risk Factors  ETOH use  Morbid Obesity, Body mass index is 40.23 kg/(m^2).   Other Active  Problems  Anemia   Hospital day # [place] for Pager information 08/10/2015 11:25 AM   49 yo F with hx of DM, HTN, HLD, obesity presented with confusion, amnesia and memory difficulty. MRI showed left ACA and right PCA infarcts, concerning for embolic vs. CNS vasculitis. Stroke work up so far including CUS and 2D echo negative. But will need TEE, CTA head, CSF analysis and hypercoagulable and autoimmune work up. On ASA and statin. Deposition depends on above results.   Rosalin Hawking, MD PhD Stroke Neurology 08/10/2015 4:05 PM    To contact Stroke Continuity provider, please refer to [email or web address] After hours, contact General Neurology

## 2015-08-10 NOTE — Progress Notes (Signed)
PT Cancellation Note  Patient Details Name: Annette Munoz MRN: [id number] DOB: 05/09/1966   Cancelled Treatment:    Reason Eval/Treat Not Completed: Patient at procedure or test/unavailable. Pt currently off unit for procedure. Will check back as schedule allows.   Conni SlipperKirkman, [name] 08/10/2015, 2:39 PM   Conni SlipperLaura Coury Grieger, PT, DPT Acute Rehabilitation Services Pager: [phone]

## 2015-08-10 NOTE — CV Procedure (Signed)
    TRANSESOPHAGEAL ECHOCARDIOGRAM (TEE) NOTE  INDICATIONS: stroke  PROCEDURE:   Informed consent was obtained prior to the procedure. The risks, benefits and alternatives for the procedure were discussed and the patient comprehended these risks.  Risks include, but are not limited to, cough, sore throat, vomiting, nausea, somnolence, esophageal and stomach trauma or perforation, bleeding, low blood pressure, aspiration, pneumonia, infection, trauma to the teeth and death.    After a procedural time-out, the patient was given 4 mg versed and 100 mcg fentanyl for moderate sedation.  The oropharynx was anesthetized 10 cc of topical 1% viscous lidocaine and 2 cetacaine sprays.  The transesophageal probe was inserted in the esophagus and stomach without difficulty and multiple views were obtained.  The patient was kept under observation until the patient left the procedure room.  The patient left the procedure room in stable condition.   Agitated microbubble saline contrast was administered.  COMPLICATIONS:    There were no immediate complications.  Findings:  1. LEFT VENTRICLE: The left ventricular wall thickness is normal.  The left ventricular cavity is normal in size. Wall motion is normal.  LVEF is 55-60%.  2. RIGHT VENTRICLE:  The right ventricle is normal in structure and function without any thrombus or masses.    3. LEFT ATRIUM:  The left atrium is normal in size without any thrombus or masses.  There is not spontaneous echo contrast ("smoke") in the left atrium consistent with a low flow state.  4. LEFT ATRIAL APPENDAGE:  The left atrial appendage is free of any thrombus or masses. The appendage has single lobes. Pulse doppler indicates high flow in the appendage.  5. ATRIAL SEPTUM:  The atrial septum appears intact and is free of thrombus and/or masses.  There is no evidence for interatrial shunting by color doppler and saline microbubble.  6. RIGHT ATRIUM:  The right atrium is  normal in size and function without any thrombus or masses.  7. MITRAL VALVE:  The mitral valve is normal in structure and function with trivial regurgitation.  There were no vegetations or stenosis.  8. AORTIC VALVE:  The aortic valve is trileaflet, normal in structure and function with no regurgitation.  There were no vegetations or stenosis  9. TRICUSPID VALVE:  The tricuspid valve is normal in structure and function with trivial regurgitation.  There were no vegetations or stenosis  10.  PULMONIC VALVE:  The pulmonic valve is normal in structure and function with no regurgitation.  There were no vegetations or stenosis.   11. AORTIC ARCH, ASCENDING AND DESCENDING AORTA:  There was no Myrtis Ser(Katz et. Al, 1992) atherosclerosis of the ascending aorta, aortic arch, or proximal descending aorta.  12. PULMONARY VEINS: Anomalous pulmonary venous return was not noted.  13. PERICARDIUM: The pericardium appeared normal and non-thickened.  There is no pericardial effusion.  IMPRESSION:   1.  No LAA thrombus 2. Negative for PFO 3. No significant valvular disease 4. LVEF 55-60%  RECOMMENDATIONS:    1.  Consider loop recorder implant for detection of occult arrhyhtmia. Will d/w EP.  Time Spent Directly with the Patient:  45 minutes   Annette NoseKenneth C. Roselyn Doby, MD, [facility]. CarmelFACC Attending Cardiologist [facility]  08/10/2015, 2:32 PM

## 2015-08-10 NOTE — Progress Notes (Signed)
Occupational Therapy Treatment Patient Details Name: Annette SanerBetty H Munoz MRN: [id number] DOB: 10/25/1965 Today's Date: 08/10/2015    History of present illness Annette Munoz is a 49 y.o. female adm 08/07/2015 for evaluation of a several day history of altered mental status with memory difficulties (first noted 10/19).  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 There was also a small infarct right thalamus/cerebral peduncle. PMHx- diabetes mellitus, hypertension, obesity, and hyperlipidemia   OT comments  Pt continues with impaired attention and memory deficits.  She requires mod verbal cues for simple path finding activities.  Requires supervision for ADLs.  Family verbalizes understanding of pt's deficits and need for 24 hour supervision.  Encouraged daughter to allow pt to perform familiar activites with supervision, and to assist only if pt errs and is unable to problem solve through an acceptable solution.  Will follow.   Follow Up Recommendations  Outpatient OT;Supervision/Assistance - 24 hour    Equipment Recommendations  None recommended by OT    Recommendations for Other Services      Precautions / Restrictions         Mobility Bed Mobility Overal bed mobility: Modified Independent                Transfers Overall transfer level: Modified independent                    Balance Overall balance assessment: Needs assistance Sitting-balance support: Feet supported Sitting balance-Leahy Scale: Good       Standing balance-Leahy Scale: Good                     ADL Overall ADL's : Needs assistance/impaired Eating/Feeding: Supervision/ safety;Sitting   Grooming: Wash/dry hands;Wash/dry face;Oral care;Brushing hair;Supervision/safety;Standing   Upper Body Bathing: Supervision/ safety;Set up;Sitting   Lower Body Bathing: Supervison/ safety;Sit to/from stand   Upper Body Dressing :  Set up;Supervision/safety;Sitting   Lower Body Dressing: Supervision/safety;Sit to/from stand   Toilet Transfer: Modified Independent;Ambulation   Toileting- Clothing Manipulation and Hygiene: Supervision/safety;Sit to/from stand       Functional mobility during ADLs: Modified independent        Vision                     Perception     Praxis      Cognition   Behavior During Therapy: [place]FL for tasks assessed/performed Overall Cognitive Status: Impaired/Different from baseline Area of Impairment: Attention;Memory;Safety/judgement;Problem solving   Current Attention Level: Selective Memory: Decreased short-term memory;Decreased recall of precautions  Following Commands: Follows multi-step commands inconsistently Safety/Judgement: Decreased awareness of safety;Decreased awareness of deficits Awareness: Emergent   General Comments: Pt unable to recall what we did in OT yesterday, even with visual and contextual cues.   She required mod verbal cues to perform simple path finding     Extremity/Trunk Assessment               Exercises     Shoulder Instructions       General Comments      Pertinent Vitals/ Pain       Pain Assessment: No/denies pain  Home Living                                          Prior Functioning/Environment  Frequency Min 3X/week     Progress Toward Goals  OT Goals(current goals can now be found in the care plan section)  Progress towards OT goals: Progressing toward goals     Plan Discharge plan needs to be updated    Co-evaluation                 End of Session     Activity Tolerance Patient tolerated treatment well   Patient Left in bed;with call bell/phone within reach;with family/visitor present   Nurse Communication Mobility status        Time: 0981-1914 OT Time Calculation (min): 23 min  Charges: OT General Charges $OT Visit: 1 Procedure OT  Treatments $Therapeutic Activity: 23-37 mins  Julio Storr M 08/10/2015, 5:15 PM

## 2015-08-11 ENCOUNTER — Encounter (HOSPITAL_COMMUNITY): Payer: Self-pay | Admitting: Internal Medicine

## 2015-08-11 ENCOUNTER — Inpatient Hospital Stay (HOSPITAL_COMMUNITY): Payer: Commercial Managed Care - HMO

## 2015-08-11 DIAGNOSIS — I63522 Cerebral infarction due to unspecified occlusion or stenosis of left anterior cerebral artery: Secondary | ICD-10-CM

## 2015-08-11 DIAGNOSIS — E1159 Type 2 diabetes mellitus with other circulatory complications: Secondary | ICD-10-CM

## 2015-08-11 LAB — C3 COMPLEMENT: C3 COMPLEMENT: 149 mg/dL (ref 82–167)

## 2015-08-11 LAB — GLUCOSE, CAPILLARY
GLUCOSE-CAPILLARY: 201 mg/dL — AB (ref 65–99)
Glucose-Capillary: 234 mg/dL — ABNORMAL HIGH (ref 65–99)
Glucose-Capillary: 185 mg/dL — ABNORMAL HIGH (ref 65–99)
Glucose-Capillary: 152 mg/dL — ABNORMAL HIGH (ref 65–99)

## 2015-08-11 LAB — HIV ANTIBODY (ROUTINE TESTING W REFLEX): HIV Screen 4th Generation wRfx: NONREACTIVE

## 2015-08-11 LAB — CSF CELL COUNT WITH DIFFERENTIAL
RBC Count, CSF: 48 /mm3 — ABNORMAL HIGH
TUBE #: 1
WBC CSF: 1 /mm3 (ref 0–5)

## 2015-08-11 LAB — CARDIOLIPIN ANTIBODIES, IGG, IGM, IGA
Anticardiolipin IgA: <9 APL U/mL (ref 0–11)
Anticardiolipin IgG: <9 GPL U/mL (ref 0–14)
Anticardiolipin IgM: <9 MPL U/mL (ref 0–12)

## 2015-08-11 LAB — ANTI-DNA ANTIBODY, DOUBLE-STRANDED

## 2015-08-11 LAB — CRYPTOCOCCAL ANTIGEN, CSF: CRYPTO AG: NEGATIVE

## 2015-08-11 LAB — SJOGRENS SYNDROME-A EXTRACTABLE NUCLEAR ANTIBODY: SSA (Ro) (ENA) Antibody, IgG: <0.2 AI (ref 0.0–0.9)

## 2015-08-11 LAB — ANTINUCLEAR ANTIBODIES, IFA: ANTINUCLEAR ANTIBODIES, IFA: NEGATIVE

## 2015-08-11 LAB — GLUCOSE, CSF: GLUCOSE CSF: 87 mg/dL — AB (ref 40–70)

## 2015-08-11 LAB — PROTEIN C, TOTAL: Protein C, Total: 131 % (ref 60–150)

## 2015-08-11 LAB — ANCA TITERS: C-ANCA: <1:20 {titer}

## 2015-08-11 LAB — RPR: RPR: NONREACTIVE

## 2015-08-11 LAB — C4 COMPLEMENT: Complement C4, Body Fluid: 31 mg/dL (ref 14–44)

## 2015-08-11 LAB — SJOGRENS SYNDROME-B EXTRACTABLE NUCLEAR ANTIBODY: SSB (La) (ENA) Antibody, IgG: <0.2 AI (ref 0.0–0.9)

## 2015-08-11 LAB — VARICELLA ZOSTER ANTIBODY, IGG: Varicella IgG: 2055 index (ref >165)

## 2015-08-11 LAB — PROTEIN, CSF: Total  Protein, CSF: 21 mg/dL (ref 15–45)

## 2015-08-11 MED ORDER — WHITE PETROLATUM GEL
Status: AC
Start: 2015-08-11 — End: 2015-08-11
  Administered 2015-08-11: 0.2
  Filled 2015-08-11: qty 1

## 2015-08-11 MED ORDER — ZOLPIDEM TARTRATE 5 MG PO TABS
5.0000 mg | ORAL_TABLET | Freq: Every evening | ORAL | Status: DC | PRN
  Administered 2015-08-11: 5 mg via ORAL
  Filled 2015-08-11: qty 1

## 2015-08-11 NOTE — Progress Notes (Signed)
Speech Language Pathology Treatment: Cognitive-Linquistic  Patient Details Name: Annette Munoz MRN: [id number] DOB: 08/27/1966 Today's Date: 08/11/2015 Time: [id number] SLP Time Calculation (min) (ACUTE ONLY): 20 min  Assessment / Plan / Recommendation Clinical Impression  Pt seen for cognitive intervention with daughter and mother present. Pt initially exhibited decreased awareness (apathy) re: medical situation. Able to state she had a stroke (progress according to daughter)and demonstrated emerging awareness after stating difficulty with short term memory. She required max verbal cues to acknowledge memory notebook, purpose and utilization throughout session. SLP assisted with determining what information to record in notebook to refer to for important and relevant recall. Sustained attention during session with mild verbal cueing. SLP provided pt with "homework" to plan and order her dinner for this evening. Pt plans to discharge today to stay with mom. Recommend outpatient ST (may be home health) to continue memory and awareness intervention.   HPI Other Pertinent Information: Annette Munoz is a 49 y.o. female adm 08/07/2015 for evaluation of a several day history of altered mental status with memory difficulties (first noted 10/19). 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 There was also a small infarct right thalamus/cerebral peduncle. PMHx- diabetes mellitus, hypertension, obesity, and hyperlipidemia.    Pertinent Vitals Pain Assessment: No/denies pain  SLP Plan  Continue with current plan of care    Recommendations                Follow up Recommendations: Outpatient SLP Plan: Continue with current plan of care    GO     Royce MacadamiaLitaker, Jeidy Hoerner Willis 08/11/2015, 12:49 PM   Breck CoonsLisa Willis Lonell FaceLitaker M.Ed [employer]-SLP Pager [phone]

## 2015-08-11 NOTE — Procedures (Signed)
CLINICAL DATA: [Stroke.  Diabetes.  Hypertension.]  EXAM:  DIAGNOSTIC LUMBAR PUNCTURE UNDER FLUOROSCOPIC GUIDANCE  FLUOROSCOPY TIME: Radiation Exposure Index (as provided by the fluoroscopic device):  [93 microGy*m^2]  PROCEDURE:   I discussed the risks (including hemorrhage, infection, headache, and nerve damage, among others), benefits, and alternatives to fluoroscopically guided lumbar puncture with the patient.  We specifically discussed the high technical likelihood of success of the procedure. The patient understood and elected to undergo the procedure.    Standard time-out was employed.  Following sterile skin prep and local anesthetic administration consisting of 1 percent lidocaine, a 22 gauge spinal needle was advanced without difficulty into the thecal sac at the at the [L3-4] level.  Clear CSF was returned.  Opening pressure was [22 cm water].    13 cc of clear CSF was collected.  The needle was subsequently removed and the skin cleansed and bandaged.  No immediate complications were observed.    IMPRESSION: [  Technically successful lumbar puncture at the L3-4 level.  Opening pressure 22 cm of water.  13 cc of clear CSF was sent to the lab for analysis.    ]

## 2015-08-11 NOTE — Progress Notes (Signed)
Physical Therapy Treatment Patient Details Name: Annette Munoz MRN: [id number] DOB: 1966-03-24 Today's Date: 08/11/2015    History of Present Illness Annette Munoz is a 49 y.o. female adm 08/07/2015 for evaluation of a several day history of altered mental status with memory difficulties (first noted 10/19).  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 There was also a small infarct right thalamus/cerebral peduncle. PMHx- diabetes mellitus, hypertension, obesity, and hyperlipidemia    PT Comments    Patient's decr awareness of Lt side much improved compared to 10/23. Balance becomes an issue when dual-tasking with cognitive challenges (6 losses of balance, pt recovers with stagger step and no outside assist). Pt refuses to state she has had a stroke, "I'm not going to own that."    Follow Up Recommendations  Outpatient PT;Supervision/Assistance - 24 hour     Equipment Recommendations  None recommended by PT    Recommendations for Other Services       Precautions / Restrictions Precautions Precautions: Fall Restrictions Weight Bearing Restrictions: No    Mobility  Bed Mobility Overal bed mobility: Independent Bed Mobility: Supine to Sit     Supine to sit: Independent        Transfers Overall transfer level: Needs assistance Equipment used: None Transfers: Sit to/from Stand Sit to Stand: Supervision         General transfer comment: Supervision for safety. no imbalance, however can occur with incr distractions  Ambulation/Gait Ambulation/Gait assistance: Supervision;Min guard Ambulation Distance (Feet): 300 Feet Assistive device: None Gait Pattern/deviations: Step-through pattern;Drifts right/left;Wide base of support Gait velocity: WNL; able to vary up/down   General Gait Details: Slight drifting with head turns and distractions. Pt with increased difficulty with cognitive challenges while  walking (slows and/or loses balance--able to catch herself). Loss of balance x 6 (usually posterior) during gait activities   Stairs Stairs: Yes Stairs assistance: Min guard Stair Management: One rail Left;Alternating pattern;Forwards Number of Stairs: 10 General stair comments: no imbalance, ?some ataxia/neglect RLE as descending (seemed to "flail" forward as she advanced it  Wheelchair Mobility    Modified Rankin (Stroke Patients Only) Modified Rankin (Stroke Patients Only) Pre-Morbid Rankin Score: No symptoms Modified Rankin: Moderate disability     Balance     Sitting balance-Leahy Scale: Fair       Standing balance-Leahy Scale: Fair                      Cognition Arousal/Alertness: Awake/alert Behavior During Therapy: WFL for tasks assessed/performed (euphoric) Overall Cognitive Status: Impaired/Different from baseline Area of Impairment: Memory;Safety/judgement;Awareness;Attention   Current Attention Level: Selective Memory: Decreased short-term memory;Decreased recall of precautions   Safety/Judgement: Decreased awareness of safety;Decreased awareness of deficits Awareness: Emergent   General Comments: difficulty performing cognitive tasks while walking (slows and eventually stops or loses her balance and stops)    Exercises      General Comments        Pertinent Vitals/Pain Pain Assessment: No/denies pain    Home Living                      Prior Function            PT Goals (current goals can now be found in the care plan section) Acute Rehab PT Goals Patient Stated Goal: unable to state PT Goal Formulation: With patient/family Time For Goal Achievement: 08/15/15 Potential to Achieve Goals: Good Progress towards PT  goals: Progressing toward goals    Frequency  Min 4X/week    PT Plan Discharge plan needs to be updated    Co-evaluation             End of Session   Activity Tolerance: Patient tolerated treatment  well Patient left: in bed;with call bell/phone within reach;with family/visitor present     Time: [id number] PT Time Calculation (min) (ACUTE ONLY): 30 min  Charges:  $Gait Training: 23-37 mins                    G Codes:      Annette Munoz 08/11/2015, 12:04 PM Pager [phone]

## 2015-08-11 NOTE — Progress Notes (Signed)
Inpatient Diabetes Program Recommendations  AACE/ADA: New Consensus Statement on Inpatient Glycemic Control (2015)  Target Ranges:  Prepandial:   less than 140 mg/dL      Peak postprandial:   less than 180 mg/dL (1-2 hours)      Critically ill patients:  140 - 180 mg/dL   Results for Annette Munoz, Annette Munoz (MRN [id number]) as of 08/11/2015 08:39  Ref. Range 08/10/2015 06:43 08/10/2015 11:23 08/10/2015 16:26 08/10/2015 21:46 08/11/2015 06:47  Glucose-Capillary Latest Ref Range: 65-99 mg/dL 130203 (Munoz) 865153 (Munoz) 784132 (Munoz) 246 (Munoz) 234 (Munoz)   Review of Glycemic Control  Patient was not covered by [employer] at supper time last night and had a higher glucose at bedtime and this morning. Might want to consider adding HS scale if patient remains inpatient today.   Thanks,  Christena DeemShannon Gedalya Jim RN, MSN, [facility] Inpatient Diabetes Coordinator Team Pager [phone] (8a-5p)

## 2015-08-11 NOTE — Progress Notes (Signed)
[facility] PROGRESS NOTE  COPPER KIRTLEY ZOX:[id number] DOB: [birth year] DOA: 08/07/2015 PCP: No primary care provider on file.   Attending MD note  Patient was seen, examined,treatment plan was discussed with the PA-S.  I have personally reviewed the clinical findings, lab, imaging studies and management of this patient in detail. I agree with the documentation, as recorded by the PA-S.    Doing well-no focal deficits on exam. Await lumbar puncture prior to discharge. Suspect home tomorrow morning. Rest of the issues remain stable.   [facility]  Assessment/Plan: Primary Problem Acute embolic CVA: Presented to the ED with mental status changes x several days. MRI brain shows multiple infarcts involving the left ACA territory and acute infarct in the right thalamus- likely embolic. Telemetry negative for atrial fibrillation. Transthoracic echo- negative for embolic source, Carotid doppler-no significant stenosis. 2-D echo-mild hypetrophy, ejection fraction 55%-60%. Lower extremity venous duplex - negative for DVT. TEE- negative , no PFO. LDL 138 (goal<70)- increased Lipitor to 80 mg. A1C 7.2. Continue on Aspirin. Per neurology no need for loop recorder.Neurology considering lumbar puncture to make sure no CNS vasculitis. Initially CIR was contemplated-but now plans are for discharge home with outpatient PT  Active Problems Dyslipidemia: see above  DM (diabetes mellitus), type 2, uncontrolled: Continue to hold metformin-continue SSI-CBGs stable  Hypertension: Allow permissive hypertension-continue to hold lisinopril.  Morbid obesity: Encouraged weight loss  Code Status: Full code Family Communication: Mother at bedside Disposition Plan: Remain inpatient-home in am  Consultants:  neuorology  Procedures:  LP  Antibiotics: See below HPI/Subjective: Annette Munoz is a 49 y.o. Female with a history of diabetes and hypertension who presented to the ED  on 08/07/15 with mental status changes. CT was performed and demonstrated possible left frontal lobe infarct. MRI showed multiple small acute left ACA territory infarcts and a small infarct involving The anterior- inferior right thalamus/cerebral peduncle. She has some short term memory loss. Patient is scheduled for LP.  Objective: Filed Vitals:   08/11/15 0922  BP: 157/67  Pulse: 61  Temp: 98 F (36.7 C)  Resp: 20   No intake or output data in the 24 hours ending 08/11/15 1346 Filed Weights   08/07/15 1134 08/10/15 1319  Weight: 99.791 kg (220 lb) 99.791 kg (220 lb)    Exam:  General: Awake and alert  Cardiovascular: S1,S2 Regular, no murmurs.  Respiratory: Normal effort, clear to auscultation anteriorly  Abdomen: Soft, non-tender, non-distended.  Musculoskeletal: No edema, normal strength.  Neurology: Nonfocal exam   Data Reviewed: Basic Metabolic Panel:  Recent Labs Lab 08/07/15 1206  NA 134*  K 3.7  CL 105  CO2 22  GLUCOSE 185*  BUN 12  CREATININE 0.84  CALCIUM 8.6*   Liver Function Tests:  Recent Labs Lab 08/07/15 1206  AST 18  ALT 16  ALKPHOS 45  BILITOT 0.5  PROT 6.3*  ALBUMIN 3.4*   No results for input(s): LIPASE, AMYLASE in the last 168 hours. No results for input(s): AMMONIA in the last 168 hours. CBC:  Recent Labs Lab 08/07/15 1206  WBC 6.6  NEUTROABS 3.6  HGB 10.9*  HCT 33.9*  MCV 94.4  PLT 355   Cardiac Enzymes: No results for input(s): CKTOTAL, CKMB, CKMBINDEX, TROPONINI in the last 168 hours. BNP (last 3 results) No results for input(s): BNP in the last 8760 hours.  ProBNP (last 3 results) No results for input(s): PROBNP in the last 8760 hours.  CBG:  Recent Labs Lab 08/10/15 1123 08/10/15  1626 08/10/15 2146 08/11/15 0647 08/11/15 1123  GLUCAP 153* 132* 246* 234* 185*    No results found for this or any previous visit (from the past 240 hour(s)).   Studies: Ct Angio Head W/cm &/or Wo Cm  08/10/2015   CLINICAL DATA:  Acute infarcts. MR angiogram was significantly degraded by patient motion. EXAM: CT ANGIOGRAPHY HEAD TECHNIQUE: Multidetector CT imaging of the head was performed using the standard protocol during bolus administration of intravenous contrast. Multiplanar CT image reconstructions and MIPs were obtained to evaluate the vascular anatomy. CONTRAST:  50mL OMNIPAQUE IOHEXOL 350 MG/ML SOLN COMPARISON:  MRI brain and MRA head 08/07/2015 FINDINGS: CT HEAD Brain: Infarcts involving the left the anterior genu of the corpus callosum and right hypothalamus again seen. Right superior ACA territory infarcts are less well visualized. No new acute infarcts are present. The basal ganglia are intact. Insular ribbon is normal. The ventricles are of normal size. No significant extra-axial fluid collection is present. Calvarium and skull base: Within normal limits. Paranasal sinuses: The paranasal sinuses and mastoid air cells are clear. Orbits: The globes and orbits are intact. CTA HEAD Anterior circulation: Atherosclerotic irregularity is present within all cavernous internal carotid arteries bilaterally. There is a 50% stenosis along anterior genu on the left. More proximal internal carotid arteries are within normal limits bilaterally. ICA termini are within normal limits. There is a moderate stenosis of proximal left M1 segment with more mild narrowing distally proximal to the left MCA bifurcation. Moderate proximal stenosis is present in posterior left M2 branch. A a moderate to high-grade stenosis is present in the proximal right A2 segment. Diffuse regularity is present on the left there is a high-grade stenosis. There is asymmetric attenuation of distal left ACA branch vessels. The distal MCA branch vessels are otherwise intact bilaterally. Posterior circulation: The left vertebral artery is dominant vessel. The PICA origins are visualized and normal bilaterally. The basilar artery is within normal limits. The  posterior cerebral arteries both originate from the subclavian arteries. There is moderate stenosis in the proximal posterior cerebral arteries bilaterally with significant attenuation of distal branch vessels. Venous sinuses: The dural sinuses are patent. The left transverse sinus is dominant. The straight sinus and deep cerebral veins are intact. Cortical veins are within normal limits. Anatomic variants: None Delayed phase:Postcontrast images demonstrate no pathologic enhancement. The infarcts are again noted. IMPRESSION: 1. Infarcts involving the right hypothalamus, anterior genu of corpus callosum on the left, left distal ACA distribution. 2. A high-grade stenosis in proximal right A2 segment and more distal left A3 segment, within the left pericallosal artery. 3. Diffuse attenuation of distal ACA branch vessels, worse on the left. 4. Moderate proximal more mild distal left M1 segment stenoses. 5. Moderate proximal left M2 stenoses beyond the bifurcation. 6. Mild attenuation of distal MCA branch vessels bilaterally. 7. Moderate proximal PCA stenoses bilaterally with significant attenuation of distal small vessels. Electronically Signed   By: Marin Roberts M.D.   On: 08/10/2015 16:01    Scheduled Meds: . aspirin  325 mg Oral Daily  . atorvastatin  80 mg Oral q1800  . enoxaparin (LOVENOX) injection  40 mg Subcutaneous Q24H  . insulin aspart  0-9 Units Subcutaneous TID WC   Continuous Infusions:   Principal Problem:   CVA (cerebral infarction) Active Problems:   DM (diabetes mellitus), type 2, uncontrolled (HCC)   Pure hypercholesterolemia   Benign essential HTN   Cerebrovascular accident (CVA) due to thrombosis of cerebral artery (HCC)   Essential  hypertension   HLD (hyperlipidemia)    Time spent: 40 minutes   Edgar FriskJohnson, Bree  PA-S [facility]  If 7PM-7AM, please contact night-coverage at [email or web address], password [email or web address] 08/11/2015, 1:46 PM  LOS: 4 days

## 2015-08-11 NOTE — Progress Notes (Signed)
STROKE TEAM PROGRESS NOTE   SUBJECTIVE (INTERVAL HISTORY) Multiple family members are at the bedside. They are asking about CTA results. They state she does not have a HA. Pt and family felt that she is back to her baseline.   OBJECTIVE Temp:  [97.1 F (36.2 C)-98.5 F (36.9 C)] 98 F (36.7 C) (10/26 0922) Pulse Rate:  [54-89] 61 (10/26 0922) Cardiac Rhythm:  [-] Normal sinus rhythm (10/26 0802) Resp:  [11-21] 20 (10/26 0922) BP: (131-215)/(47-109) 157/67 mmHg (10/26 0922) SpO2:  [93 %-100 %] 96 % (10/26 0922) Weight:  [99.791 kg (220 lb)] 99.791 kg (220 lb) (10/25 1319)  CBC:   Recent Labs Lab 08/07/15 1206  WBC 6.6  NEUTROABS 3.6  HGB 10.9*  HCT 33.9*  MCV 94.4  PLT 952   Basic Metabolic Panel:   Recent Labs Lab 08/07/15 1206  NA 134*  K 3.7  CL 105  CO2 22  GLUCOSE 185*  BUN 12  CREATININE 0.84  CALCIUM 8.6*   Lipid Panel:     Component Value Date/Time   CHOL 206* 08/08/2015 0545   TRIG 126 08/08/2015 0545   HDL 43 08/08/2015 0545   CHOLHDL 4.8 08/08/2015 0545   VLDL 25 08/08/2015 0545   LDLCALC 138* 08/08/2015 0545   HgbA1c:  Lab Results  Component Value Date   HGBA1C 7.2* 08/08/2015   Urine Drug Screen:     Component Value Date/Time   LABOPIA NONE DETECTED 08/07/2015 1754   COCAINSCRNUR NONE DETECTED 08/07/2015 1754   LABBENZ NONE DETECTED 08/07/2015 1754   AMPHETMU NONE DETECTED 08/07/2015 1754   THCU NONE DETECTED 08/07/2015 1754   LABBARB NONE DETECTED 08/07/2015 1754      IMAGING I have personally reviewed the radiological images below and agree with the radiology interpretations.  Ct Head Wo Contrast 08/07/2015   Focal low density in the left frontal lobe white matter which may be in the corpus callosum. Acute infarct is not excluded.    Mr Jodene Nam Head/brain Wo Cm 08/07/2015   1. Multiple small, acute left ACA territory infarcts involving the corpus callosum, cingulate gyrus, and superior left frontal lobe.  2. Small acute  infarct involving the anteroinferior right thalamus/cerebral peduncle.  3. Moderately to severely motion degraded head MRA without evidence of large vessel occlusion or flow limiting proximal stenosis. Suggestion of A2 ACA segmental narrowing.   CTA head  08/10/2015 1. Infarcts involving the right hypothalamus, anterior genu of corpus callosum on the left, left distal ACA distribution. 2. A high-grade stenosis in proximal right A2 segment and more distal left A3 segment, within the left pericallosal artery. 3. Diffuse attenuation of distal ACA branch vessels, worse on the left. 4. Moderate proximal more mild distal left M1 segment stenoses. 5. Moderate proximal left M2 stenoses beyond the bifurcation. 6. Mild attenuation of distal MCA branch vessels bilaterally. 7. Moderate proximal PCA stenoses bilaterally with significant attenuation of distal small vessels.  Carotid Doppler 1-39% ICA plaquing. Vertebral artery flow is antegrade.  2D Echocardiogram   - Left ventricle: The cavity size was normal. There was mildconcentric hypertrophy. Systolic function was normal. Theestimated ejection fraction was in the range of 55% to 60%. Wallmotion was normal; there were no regional wall motionabnormalities. - Left atrium: The atrium was mildly dilated. - Atrial septum: No defect or patent foramen ovale was identified.  LE venous dopplers negative for DVT  TEE  1. No LAA thrombus 2. Negative for PFO 3. No significant valvular disease 4. LVEF 55-60%  PHYSICAL EXAM General - Well nourished, well developed, in no apparent distress.  Ophthalmologic - Sharp disc margins OU.  Cardiovascular - Regular rate and rhythm with no murmur.  Mental Status -  Level of arousal and orientation to time, place, and person were intact. Language including expression, naming, repetition, comprehension was assessed and found intact. Attention span and concentration were normal. Recent and remote memory  were 3/3 registration but 0/3 delayed recall, with cuing 2/3. Fund of Knowledge was assessed and was impaired.  Cranial Nerves II - XII - II - Visual field intact OU. III, IV, VI - Extraocular movements intact. V - Facial sensation intact bilaterally. VII - Facial movement intact bilaterally. VIII - Hearing & vestibular intact bilaterally. X - Palate elevates symmetrically. XI - Chin turning & shoulder shrug intact bilaterally. XII - Tongue protrusion intact.  Motor Strength - The patient's strength was normal in all extremities and pronator drift was absent.  Bulk was normal and fasciculations were absent.   Motor Tone - Muscle tone was assessed at the neck and appendages and was normal.  Reflexes - The patient's reflexes were 1+ in all extremities and she had no pathological reflexes.  Sensory - Light touch, temperature/pinprick, vibration and proprioception, and Romberg testing were assessed and were symmetrical.    Coordination - The patient had normal movements in the hands and feet with no ataxia or dysmetria.  Tremor was absent.  Gait and Station - not tested due to safety concerns.   ASSESSMENT/PLAN [name] is a 49 y.o. female with history of diabetes mellitus, hypertension, obesity, and hyperlipidemia, presenting with altered mental status, and amnesia. She did not receive IV t-PA due to late presentation and mild deficits.  Stroke: left ACA and right hypothalamus, etiology unknown. CNS vasculitis and RCVS are in the DDx. cardioembolic less likely. Do not feel loop recorder is warranted. However, she needs LP and close neuro follow up to rule out or rule in PACNS or RCVS.   Resultant  Amnesia and impaired delayed recall  MRI - left ACA infarcts and right hypothalamus infarcts  MRA - motion degraded with no large vessel occlusion or stenosis.  CTA head - right hypothalamus, anterior genu of corpus callosum on the left, left distal ACA distribution infarcts. R A2  high grade stenosis, distal L A3. Also moderate stenosis L M1, mild stenosis L M2, proximal bi PCA. Questionable distal vessel beeding.  Carotid Doppler No significant stenosis   2D Echo  No source of embolus   Venous doppler - negative for DVT  TEE negative for SOE, no PFO.   No indication for loop   LP - pending, add xanthochromia  LDL 138  HgbA1c 7.2  Negative antithrombin III, C3, C4, HIV, ESR, RPR  pending  lupus anticoagulant, cardiolipin antibody, prothrombin G20120A, homocysteine, ANA, C3, C4, CH50,  ANCA screen w/ reflex titer, Sjogren's Syndrome Antibodies [SSA & SSB]), DSDNA, Prot C & S  VTE prophylaxis - Lovenox Diet Heart Room service appropriate?: Yes; Fluid consistency:: Thin  No antithrombotic prior to admission, now on aspirin 325 mg daily.   Patient counseled to be compliant with her antithrombotic medications  Ongoing aggressive stroke risk factor management  Recommend OP sleep testing for sleep apnea  Therapy recommendations: SNF vs HH. Too high level for CIR  Disposition: pending   Hypertension  BP trending to normalization  BP goal normotensive  BP stable  No BP meds after admission  Hyperlipidemia  Home meds: Lipitor 20 mg  daily prior to admission   LDL 138, goal < 70  Lipitor 20 mg daily prior to admission changed to 80 mg daily.  Continue statin at discharge  Diabetes  HgbA1c 7.2, goal < 7.0  Uncontrolled  SSI  Other Stroke Risk Factors  ETOH use  Morbid Obesity, Body mass index is 40.23 kg/(m^2).   Other Active Problems  Anemia  Depends on LP result, pt needs to repeat CTA head in 2 months. And she needs to be seen in clinic in about 2 months.   Hospital day # 4   Rosalin Hawking, MD PhD Stroke Neurology 08/11/2015 1:51 PM   To contact Stroke Continuity provider, please refer to [email or web address] After hours, contact General Neurology

## 2015-08-12 DIAGNOSIS — E1165 Type 2 diabetes mellitus with hyperglycemia: Secondary | ICD-10-CM | POA: Insufficient documentation

## 2015-08-12 DIAGNOSIS — E119 Type 2 diabetes mellitus without complications: Secondary | ICD-10-CM | POA: Insufficient documentation

## 2015-08-12 DIAGNOSIS — E785 Hyperlipidemia, unspecified: Secondary | ICD-10-CM

## 2015-08-12 DIAGNOSIS — IMO0002 Reserved for concepts with insufficient information to code with codable children: Secondary | ICD-10-CM | POA: Insufficient documentation

## 2015-08-12 LAB — LUPUS ANTICOAGULANT PANEL
DRVVT: 34 s (ref 0.0–44.0)
PTT LA: 35.4 s (ref 0.0–40.6)

## 2015-08-12 LAB — BETA-2-GLYCOPROTEIN I ABS, IGG/M/A
Beta-2-Glycoprotein I IgA: <9 GPI IgA units (ref 0–25)
Beta-2-Glycoprotein I IgM: <9 GPI IgM units (ref 0–32)

## 2015-08-12 LAB — GLUCOSE, CAPILLARY: Glucose-Capillary: 187 mg/dL — ABNORMAL HIGH (ref 65–99)

## 2015-08-12 LAB — PROTEIN S, TOTAL: PROTEIN S AG TOTAL: 110 % (ref 60–150)

## 2015-08-12 LAB — COMPLEMENT, TOTAL: Compl, Total (CH50): >60 U/mL (ref 42–60)

## 2015-08-12 LAB — PROTEIN S ACTIVITY: Protein S Activity: 85 % (ref 63–140)

## 2015-08-12 LAB — HOMOCYSTEINE: HOMOCYSTEINE-NORM: 7.4 umol/L (ref 0.0–15.0)

## 2015-08-12 LAB — PROTEIN C ACTIVITY: Protein C Activity: 152 % (ref 73–180)

## 2015-08-12 MED ORDER — METFORMIN HCL ER 500 MG PO TB24: 1000.0000 mg | ORAL_TABLET | Freq: Two times a day (BID) | ORAL | Status: DC

## 2015-08-12 MED ORDER — ATORVASTATIN CALCIUM 80 MG PO TABS: 80.0000 mg | ORAL_TABLET | Freq: Every day | ORAL | Status: DC

## 2015-08-12 MED ORDER — LISINOPRIL 10 MG PO TABS: 10.0000 mg | ORAL_TABLET | Freq: Every day | ORAL | Status: DC

## 2015-08-12 MED ORDER — ASPIRIN 325 MG PO TABS: 325.0000 mg | ORAL_TABLET | Freq: Every day | ORAL | Status: DC

## 2015-08-12 NOTE — Discharge Summary (Addendum)
PATIENT DETAILS Name: Annette Munoz Age: 49 y.o. Sex: female Date of Birth: 20-Jun-1966 MRN: [id number]. Admitting Physician: Calvert Cantor, MD WUJ:[id number],YNWGN, PA-C  Admit Date: 08/07/2015 Discharge date: 08/12/2015  Recommendations for Outpatient Follow-up:  1. New medications-aspirin 2. Changes in medication dosage-statin increased to 80 mg, lisinopril increased to 10 mg, metformin changed to 1000 mg twice daily. 3.  Pending CSF studies-that to be followed- CSF varicella-zoster PCR, CSF fungus culture, routine CSF culture, CSF VDRL, oligoclonal bands in CSF  4. Pending tests that need to be followed-Prothrombin gene mutation, homocystine levels 5. Repeat A1c and lipid panel in 3 months  PRIMARY DISCHARGE DIAGNOSIS:  Principal Problem:   CVA (cerebral infarction) Active Problems:   DM (diabetes mellitus), type 2, uncontrolled (HCC)   Pure hypercholesterolemia   Benign essential HTN   Cerebrovascular accident (CVA) due to thrombosis of cerebral artery (HCC)   Essential hypertension   HLD (hyperlipidemia)      PAST MEDICAL HISTORY: Past Medical History  Diagnosis Date  . Diabetes mellitus without complication (HCC)   . Hypertension     DISCHARGE MEDICATIONS: Current Discharge Medication List    START taking these medications   Details  aspirin 325 MG tablet Take 1 tablet (325 mg total) by mouth daily. Qty: 60 tablet, Refills: 0      CONTINUE these medications which have CHANGED   Details  atorvastatin (LIPITOR) 80 MG tablet Take 1 tablet (80 mg total) by mouth daily at 6 PM. Qty: 60 tablet, Refills: 0   Associated Diagnoses: Pure hypercholesterolemia    lisinopril (PRINIVIL,ZESTRIL) 10 MG tablet Take 1 tablet (10 mg total) by mouth daily. Qty: 60 tablet, Refills: 0   Associated Diagnoses: Benign essential HTN    metFORMIN (GLUCOPHAGE XR) 500 MG 24 hr tablet Take 2 tablets (1,000 mg total) by mouth 2 (two) times daily. Qty: 120 tablet, Refills: 0   Associated  Diagnoses: Diabetes type 2, uncontrolled (HCC)      STOP taking these medications     aspirin-acetaminophen-caffeine (EXCEDRIN EXTRA STRENGTH) 250-250-65 MG tablet      HYDROcodone-acetaminophen (NORCO/VICODIN) 5-325 MG tablet         ALLERGIES:  No Known Allergies  BRIEF HPI:  See H&P, Labs, Consult and Test reports for all details in brief, patient is a 49 year old female with past medical history of diabetes and hypertension who was evaluated for confusion. MRI of the brain was positive for CVA. She was subsequently admitted for further evaluation and treatment.  CONSULTATIONS:   cardiology and neurology  PERTINENT RADIOLOGIC STUDIES: Ct Angio Head W/cm &/or Wo Cm  08/10/2015  CLINICAL DATA:  Acute infarcts. MR angiogram was significantly degraded by patient motion. EXAM: CT ANGIOGRAPHY HEAD TECHNIQUE: Multidetector CT imaging of the head was performed using the standard protocol during bolus administration of intravenous contrast. Multiplanar CT image reconstructions and MIPs were obtained to evaluate the vascular anatomy. CONTRAST:  50mL OMNIPAQUE IOHEXOL 350 MG/ML SOLN COMPARISON:  MRI brain and MRA head 08/07/2015 FINDINGS: CT HEAD Brain: Infarcts involving the left the anterior genu of the corpus callosum and right hypothalamus again seen. Right superior ACA territory infarcts are less well visualized. No new acute infarcts are present. The basal ganglia are intact. Insular ribbon is normal. The ventricles are of normal size. No significant extra-axial fluid collection is present. Calvarium and skull base: Within normal limits. Paranasal sinuses: The paranasal sinuses and mastoid air cells are clear. Orbits: The globes and orbits are intact. CTA HEAD Anterior circulation: Atherosclerotic  irregularity is present within all cavernous internal carotid arteries bilaterally. There is a 50% stenosis along anterior genu on the left. More proximal internal carotid arteries are within normal  limits bilaterally. ICA termini are within normal limits. There is a moderate stenosis of proximal left M1 segment with more mild narrowing distally proximal to the left MCA bifurcation. Moderate proximal stenosis is present in posterior left M2 branch. A a moderate to high-grade stenosis is present in the proximal right A2 segment. Diffuse regularity is present on the left there is a high-grade stenosis. There is asymmetric attenuation of distal left ACA branch vessels. The distal MCA branch vessels are otherwise intact bilaterally. Posterior circulation: The left vertebral artery is dominant vessel. The PICA origins are visualized and normal bilaterally. The basilar artery is within normal limits. The posterior cerebral arteries both originate from the subclavian arteries. There is moderate stenosis in the proximal posterior cerebral arteries bilaterally with significant attenuation of distal branch vessels. Venous sinuses: The dural sinuses are patent. The left transverse sinus is dominant. The straight sinus and deep cerebral veins are intact. Cortical veins are within normal limits. Anatomic variants: None Delayed phase:Postcontrast images demonstrate no pathologic enhancement. The infarcts are again noted. IMPRESSION: 1. Infarcts involving the right hypothalamus, anterior genu of corpus callosum on the left, left distal ACA distribution. 2. A high-grade stenosis in proximal right A2 segment and more distal left A3 segment, within the left pericallosal artery. 3. Diffuse attenuation of distal ACA branch vessels, worse on the left. 4. Moderate proximal more mild distal left M1 segment stenoses. 5. Moderate proximal left M2 stenoses beyond the bifurcation. 6. Mild attenuation of distal MCA branch vessels bilaterally. 7. Moderate proximal PCA stenoses bilaterally with significant attenuation of distal small vessels. Electronically Signed   By: Marin Robertshristopher  Mattern M.D.   On: 08/10/2015 16:01   Ct Head Wo  Contrast  08/07/2015  CLINICAL DATA:  Hyperglycemia.  Confusion. EXAM: CT HEAD WITHOUT CONTRAST TECHNIQUE: Contiguous axial images were obtained from the base of the skull through the vertex without intravenous contrast. COMPARISON:  None. FINDINGS: There is a focal area of low density in the white matter anterior to the frontal horn of the left lateral ventricle. It is probably in the anterior corpus callosum. No midline shift. No hemorrhage. IMPRESSION: Focal low density in the left frontal lobe white matter which may be in the corpus callosum. Acute infarct is not excluded. Consider MRI to further characterize. Electronically Signed   By: Jolaine ClickArthur  Hoss M.D.   On: 08/07/2015 12:55   Mr Brain Wo Contrast  08/07/2015  CLINICAL DATA:  Altered mental status for 4 days. History of hypertension and diabetes. EXAM: MRI HEAD WITHOUT CONTRAST MRA HEAD WITHOUT CONTRAST TECHNIQUE: Multiplanar, multiecho pulse sequences of the brain and surrounding structures were obtained without intravenous contrast. Angiographic images of the head were obtained using MRA technique without contrast. COMPARISON:  Head CT 08/07/2015 FINDINGS: MRI HEAD FINDINGS The images are mildly to moderately motion degraded despite using faster, more motion resistant protocols. There are multiple small left ACA territory acute infarcts. The largest measures 1.3 cm and involves the corpus callosum anterior to the left frontal horn. There are additional punctate left ACA infarcts involving the left cingulate gyrus and high a medial left frontal lobe. There is also an approximately 2 cm acute infarct in the region of the anteroinferior right thalamus and right cerebral peduncle. There is mild cytotoxic edema associated with these infarcts without associated mass effect. There is no  evidence intracranial hemorrhage, mass, midline shift, or extra-axial fluid collection. Ventricles and sulci are normal for age. Orbits are unremarkable. Paranasal sinuses  and mastoid air cells are clear. Major intracranial vascular flow voids are preserved. MRA HEAD FINDINGS Study is moderately to severely motion degraded. The visualized distal vertebral arteries are patent with the left being dominant. Basilar artery is patent without evidence of significant stenosis. PCAs are grossly patent proximally but are poorly evaluated due to motion artifact. Internal carotid arteries are patent from skullbase to carotid termini. No significant ICA stenosis is identified, although cavernous and supraclinoid segments are suboptimally evaluated due to motion artifact. M1 and A1 segments are patent without evidence of gross stenosis. A2 segments are patent proximally although there is suggestion of segmental narrowing more distally, however evaluation is limited by motion artifact. MCA bifurcations are patent. No gross intracranial aneurysm is identified. IMPRESSION: 1. Multiple small, acute left ACA territory infarcts involving the corpus callosum, cingulate gyrus, and superior left frontal lobe. 2. Small acute infarct involving the anteroinferior right thalamus/cerebral peduncle. 3. Moderately to severely motion degraded head MRA without evidence of large vessel occlusion or flow limiting proximal stenosis. Suggestion of A2 ACA segmental narrowing. Electronically Signed   By: Sebastian Ache M.D.   On: 08/07/2015 14:45   Mr [name] Head/brain Wo Cm  08/07/2015  CLINICAL DATA:  Altered mental status for 4 days. History of hypertension and diabetes. EXAM: MRI HEAD WITHOUT CONTRAST MRA HEAD WITHOUT CONTRAST TECHNIQUE: Multiplanar, multiecho pulse sequences of the brain and surrounding structures were obtained without intravenous contrast. Angiographic images of the head were obtained using MRA technique without contrast. COMPARISON:  Head CT 08/07/2015 FINDINGS: MRI HEAD FINDINGS The images are mildly to moderately motion degraded despite using faster, more motion resistant protocols. There are  multiple small left ACA territory acute infarcts. The largest measures 1.3 cm and involves the corpus callosum anterior to the left frontal horn. There are additional punctate left ACA infarcts involving the left cingulate gyrus and high a medial left frontal lobe. There is also an approximately 2 cm acute infarct in the region of the anteroinferior right thalamus and right cerebral peduncle. There is mild cytotoxic edema associated with these infarcts without associated mass effect. There is no evidence intracranial hemorrhage, mass, midline shift, or extra-axial fluid collection. Ventricles and sulci are normal for age. Orbits are unremarkable. Paranasal sinuses and mastoid air cells are clear. Major intracranial vascular flow voids are preserved. MRA HEAD FINDINGS Study is moderately to severely motion degraded. The visualized distal vertebral arteries are patent with the left being dominant. Basilar artery is patent without evidence of significant stenosis. PCAs are grossly patent proximally but are poorly evaluated due to motion artifact. Internal carotid arteries are patent from skullbase to carotid termini. No significant ICA stenosis is identified, although cavernous and supraclinoid segments are suboptimally evaluated due to motion artifact. M1 and A1 segments are patent without evidence of gross stenosis. A2 segments are patent proximally although there is suggestion of segmental narrowing more distally, however evaluation is limited by motion artifact. MCA bifurcations are patent. No gross intracranial aneurysm is identified. IMPRESSION: 1. Multiple small, acute left ACA territory infarcts involving the corpus callosum, cingulate gyrus, and superior left frontal lobe. 2. Small acute infarct involving the anteroinferior right thalamus/cerebral peduncle. 3. Moderately to severely motion degraded head MRA without evidence of large vessel occlusion or flow limiting proximal stenosis. Suggestion of A2 ACA  segmental narrowing. Electronically Signed   By: Jolaine Click.D.  On: 08/07/2015 14:45   Dg Fluoro Guide Lumbar Puncture  08/11/2015  CLINICAL DATA:  Stroke.  Diabetes.  Hypertension. EXAM: DIAGNOSTIC LUMBAR PUNCTURE UNDER FLUOROSCOPIC GUIDANCE FLUOROSCOPY TIME:  Radiation Exposure Index (as provided by the fluoroscopic device): 93 microGy*m^2 PROCEDURE: I discussed the risks (including hemorrhage, infection, headache, and nerve damage, among others), benefits, and alternatives to fluoroscopically guided lumbar puncture with the patient. We specifically discussed the high technical likelihood of success of the procedure. The patient understood and elected to undergo the procedure. Standard time-out was employed. Following sterile skin prep and local anesthetic administration consisting of 1 percent lidocaine, a 22 gauge spinal needle was advanced without difficulty into the thecal sac at the at the L3-4 level. Clear CSF was returned. Opening pressure was 22 cm water. 13 cc of clear CSF was collected. The needle was subsequently removed and the skin cleansed and bandaged. No immediate complications were observed. IMPRESSION: 1. Technically successful lumbar puncture at the L3-4 level. Opening pressure 22 cm of water. 13 cc of clear CSF was sent to the lab for analysis. Electronically Signed   By: Gaylyn Rong M.D.   On: 08/11/2015 15:10     PERTINENT LAB RESULTS: CBC: No results for input(s): WBC, HGB, HCT, PLT in the last 72 hours. CMET CMP     Component Value Date/Time   NA 134* 08/07/2015 1206   K 3.7 08/07/2015 1206   CL 105 08/07/2015 1206   CO2 22 08/07/2015 1206   GLUCOSE 185* 08/07/2015 1206   BUN 12 08/07/2015 1206   CREATININE 0.84 08/07/2015 1206   CREATININE 0.67 06/24/2015 1341   CALCIUM 8.6* 08/07/2015 1206   PROT 6.3* 08/07/2015 1206   ALBUMIN 3.4* 08/07/2015 1206   AST 18 08/07/2015 1206   ALT 16 08/07/2015 1206   ALKPHOS 45 08/07/2015 1206   BILITOT 0.5  08/07/2015 1206   GFRNONAA >60 08/07/2015 1206   GFRNONAA >89 06/24/2015 1341   GFRAA >60 08/07/2015 1206   GFRAA >89 06/24/2015 1341    GFR Estimated Creatinine Clearance: 90.5 mL/min (by C-G formula based on Cr of 0.84). No results for input(s): LIPASE, AMYLASE in the last 72 hours. No results for input(s): CKTOTAL, CKMB, CKMBINDEX, TROPONINI in the last 72 hours. Invalid input(s): POCBNP No results for input(s): DDIMER in the last 72 hours. No results for input(s): HGBA1C in the last 72 hours. No results for input(s): CHOL, HDL, LDLCALC, TRIG, CHOLHDL, LDLDIRECT in the last 72 hours. No results for input(s): TSH, T4TOTAL, T3FREE, THYROIDAB in the last 72 hours.  Invalid input(s): FREET3 No results for input(s): VITAMINB12, FOLATE, FERRITIN, TIBC, IRON, RETICCTPCT in the last 72 hours. Coags: No results for input(s): INR in the last 72 hours.  Invalid input(s): PT Microbiology: Recent Results (from the past 240 hour(s))  CSF culture     Status: None (Preliminary result)   Collection Time: 08/11/15  2:57 PM  Result Value Ref Range Status   Specimen Description CSF  Final   Special Requests NONE  Final   Gram Stain   Final    WBC PRESENT, PREDOMINANTLY MONONUCLEAR NO ORGANISMS SEEN CYTOSPIN    Culture PENDING  Incomplete   Report Status PENDING  Incomplete     BRIEF HOSPITAL COURSE:  Acute embolic CVA: Presented to the ED with mental status changes x several days. Improved-able to follow commands and answer questions appropriately at the time of discharge. MRI brain shows multiple infarcts involving the left ACA territory and acute infarct in the right  thalamus- likely embolic. Telemetry negative for atrial fibrillation. Transthoracic echo- negative for embolic source, Carotid doppler-no significant stenosis. 2-D echo-showed preserved ejection fraction without any embolic foci.  Lower extremity venous duplex - negative for DVT. TEE- negative , no PFO. Subsequent was seen by  the stroke team and underwent further workup-hypercoagulable panel negative so far-but homocystine and prothrombin gene mutation levels pending. Underwent lumbar puncture with CSF analysis on 10/26-CSF serologies negative-some studies pending (see above) and will need to be followed. LDL 138 (goal<70)- increased Lipitor to 80 mg. A1C 7.2-increase metformin to 1000 mg twice a day. Spoke with Dr XU-recommendations are to continue aspirin, statin on discharge. Pending test will need to be followed at the stroke clinic or at PCPs office. Long discussion with both patient and daughter at bedside. Also note, initially CIR was contemplated, but since patient has rapidly improved-discharge home with outpatient physical therapy services.  Active Problems Dyslipidemia: see above  DM (diabetes mellitus), type 2, uncontrolled: CBGs stable with SSI while inpatient-metformin resumed on discharge-see above.   Hypertension: Allowed to have permissive hypertension-but will resume lisinopril on discharge.   Morbid obesity: Encouraged weight loss  TODAY-DAY OF DISCHARGE:  Subjective:   Annette Munoz today has no headache,no chest abdominal pain,no new weakness tingling or numbness, feels much better wants to go home today.   Objective:   Blood pressure 161/63, pulse 67, temperature 98 F (36.7 C), temperature source Oral, resp. rate 17, height 5\' 2"  (1.575 m), weight 99.791 kg (220 lb), last menstrual period 08/05/2015, SpO2 97 %. No intake or output data in the 24 hours ending 08/12/15 1055 Filed Weights   08/07/15 1134 08/10/15 1319  Weight: 99.791 kg (220 lb) 99.791 kg (220 lb)    Exam Awake Alert, Oriented *3, No new F.N deficits, Normal affect [place].AT,PERRAL Supple Neck,No JVD, No cervical lymphadenopathy appriciated.  Symmetrical Chest wall movement, Good air movement bilaterally, CTAB RRR,No Gallops,Rubs or new Murmurs, No Parasternal Heave +ve B.Sounds, Abd Soft, Non tender, No organomegaly  appriciated, No rebound -guarding or rigidity. No Cyanosis, Clubbing or edema, No new Rash or bruise  DISCHARGE CONDITION: Stable  DISPOSITION: Home-with outpatient PT  DISCHARGE INSTRUCTIONS:    Activity:  As tolerated with Full fall precautions use walker/cane & assistance as needed  Get Medicines reviewed and adjusted: Please take all your medications with you for your next visit with your Primary MD  Please request your Primary MD to go over all hospital tests and procedure/radiological results at the follow up, please ask your Primary MD to get all Hospital records sent to his/her office.  If you experience worsening of your admission symptoms, develop shortness of breath, life threatening emergency, suicidal or homicidal thoughts you must seek medical attention immediately by calling 911 or calling your MD immediately  if symptoms less severe.  You must read complete instructions/literature along with all the possible adverse reactions/side effects for all the Medicines you take and that have been prescribed to you. Take any new Medicines after you have completely understood and accpet all the possible adverse reactions/side effects.   Do not drive when taking Pain medications.   Do not take more than prescribed Pain, Sleep and Anxiety Medications  Special Instructions: If you have smoked or chewed Tobacco  in the last 2 yrs please stop smoking, stop any regular Alcohol  and or any Recreational drug use.  Wear Seat belts while driving.  Please note  You were cared for by a hospitalist during your hospital stay. Once  you are discharged, your primary care physician will handle any further medical issues. Please note that NO REFILLS for any discharge medications will be authorized once you are discharged, as it is imperative that you return to your primary care physician (or establish a relationship with a primary care physician if you do not have one) for your aftercare needs so  that they can reassess your need for medications and monitor your lab values.   Diet recommendation: Diabetic Diet Heart Healthy diet  Discharge Instructions    Ambulatory referral to Neurology    Complete by:  As directed   Dr. Roda Shutters requests followup in 2 months     Ambulatory referral to Occupational Therapy    Complete by:  As directed      Ambulatory referral to Physical Therapy    Complete by:  As directed      Call MD for:  extreme fatigue    Complete by:  As directed      Call MD for:  persistant dizziness or light-headedness    Complete by:  As directed      Call MD for:  persistant nausea and vomiting    Complete by:  As directed      Diet - low sodium heart healthy    Complete by:  As directed      Diet Carb Modified    Complete by:  As directed      Increase activity slowly    Complete by:  As directed            Follow-up Information    Follow up with [facility], PA-C.   Specialty:  Physician Assistant   Why:  appointment made for Tuesday, 08/17/2015 at 11:15am, Hospital follow up   Contact information:   [address] [place] [address] [phone]       Follow up with Xu,Jindong, MD In 2 months.   Specialty:  Neurology   Why:  Stroke Clinic, Office will call you with appointment date & time   Contact information:   [address] [place] [address] [phone]       Total Time spent on discharge equals  45 minutes.  SignedJeoffrey Massed 08/12/2015 10:55 AM

## 2015-08-12 NOTE — Progress Notes (Signed)
Patient discharge to home with family.  Patient alert and oriented, comfortable with no sign of distress noted.

## 2015-08-12 NOTE — Care Management Note (Signed)
Case Management Note  Patient Details  Name: Annette Munoz MRN: [id number] Date of Birth: 08/14/1966  Subjective/Objective:                    Action/Plan: Patient being discharged home today. PT/OT recommending outpatient therapy services and Dr Jerral RalphGhimire in agreement. CM spoke with the patient and her daughter and they are interested in Neurorehabilitation. CM entered the orders in EPIC and placed on AVS. Pt's daughter given the map and phone number for Neurorehab. Bedside RN updated.   Expected Discharge Date:                  Expected Discharge Plan:  Home/Self Care  In-House Referral:     Discharge planning Services  CM Consult  Post Acute Care Choice:    Choice offered to:     DME Arranged:    DME Agency:     HH Arranged:    HH Agency:     Status of Service:  Completed, signed off  Medicare Important Message Given:    Date Medicare IM Given:    Medicare IM give by:    Date Additional Medicare IM Given:    Additional Medicare Important Message give by:     If discussed at Long Length of Stay Meetings, dates discussed:    Additional Comments:  Kermit BaloKelli F Rheagan Nayak, RN 08/12/2015, 10:47 AM

## 2015-08-12 NOTE — Progress Notes (Signed)
STROKE TEAM PROGRESS NOTE   SUBJECTIVE (INTERVAL HISTORY) Daughter is at the bedside. They are asking about LP results. Pt is back to her baseline. PLANS FOR DISCHARGE TODAY.   OBJECTIVE Temp:  [98 F (36.7 C)-98.7 F (37.1 C)] 98 F (36.7 C) (10/27 0548) Pulse Rate:  [67-75] 67 (10/27 0548) Cardiac Rhythm:  [-] Normal sinus rhythm (10/27 0748) Resp:  [17-20] 17 (10/27 0548) BP: (153-161)/(63-73) 161/63 mmHg (10/27 0548) SpO2:  [95 %-100 %] 97 % (10/27 0548)  CBC:   Recent Labs Lab 08/07/15 1206  WBC 6.6  NEUTROABS 3.6  HGB 10.9*  HCT 33.9*  MCV 94.4  PLT 038   Basic Metabolic Panel:   Recent Labs Lab 08/07/15 1206  NA 134*  K 3.7  CL 105  CO2 22  GLUCOSE 185*  BUN 12  CREATININE 0.84  CALCIUM 8.6*   Lipid Panel:     Component Value Date/Time   CHOL 206* 08/08/2015 0545   TRIG 126 08/08/2015 0545   HDL 43 08/08/2015 0545   CHOLHDL 4.8 08/08/2015 0545   VLDL 25 08/08/2015 0545   LDLCALC 138* 08/08/2015 0545   HgbA1c:  Lab Results  Component Value Date   HGBA1C 7.2* 08/08/2015   Urine Drug Screen:     Component Value Date/Time   LABOPIA NONE DETECTED 08/07/2015 1754   COCAINSCRNUR NONE DETECTED 08/07/2015 1754   LABBENZ NONE DETECTED 08/07/2015 1754   AMPHETMU NONE DETECTED 08/07/2015 1754   THCU NONE DETECTED 08/07/2015 1754   LABBARB NONE DETECTED 08/07/2015 1754      IMAGING I have personally reviewed the radiological images below and agree with the radiology interpretations.  Ct Head Wo Contrast 08/07/2015   Focal low density in the left frontal lobe white matter which may be in the corpus callosum. Acute infarct is not excluded.    Mr Jodene Nam Head/brain Wo Cm 08/07/2015   1. Multiple small, acute left ACA territory infarcts involving the corpus callosum, cingulate gyrus, and superior left frontal lobe.  2. Small acute infarct involving the anteroinferior right thalamus/cerebral peduncle.  3. Moderately to severely motion degraded head  MRA without evidence of large vessel occlusion or flow limiting proximal stenosis. Suggestion of A2 ACA segmental narrowing.   CTA head  08/10/2015 1. Infarcts involving the right hypothalamus, anterior genu of corpus callosum on the left, left distal ACA distribution. 2. A high-grade stenosis in proximal right A2 segment and more distal left A3 segment, within the left pericallosal artery. 3. Diffuse attenuation of distal ACA branch vessels, worse on the left. 4. Moderate proximal more mild distal left M1 segment stenoses. 5. Moderate proximal left M2 stenoses beyond the bifurcation. 6. Mild attenuation of distal MCA branch vessels bilaterally. 7. Moderate proximal PCA stenoses bilaterally with significant attenuation of distal small vessels.  Carotid Doppler 1-39% ICA plaquing. Vertebral artery flow is antegrade.  2D Echocardiogram   - Left ventricle: The cavity size was normal. There was mildconcentric hypertrophy. Systolic function was normal. Theestimated ejection fraction was in the range of 55% to 60%. Wallmotion was normal; there were no regional wall motionabnormalities. - Left atrium: The atrium was mildly dilated. - Atrial septum: No defect or patent foramen ovale was identified.  LE venous dopplers negative for DVT  TEE  1. No LAA thrombus 2. Negative for PFO 3. No significant valvular disease 4. LVEF 55-60%  CSF Component     Latest Ref Rng 08/11/2015  Tube #      1  Color, CSF  COLORLESS COLORLESS  Appearance, CSF     CLEAR CLEAR  Supernatant      NOT INDICATED  RBC Count, CSF     0 /cu mm 48 (H)  WBC, CSF     0 - 5 /cu mm 1  Other Cells, CSF      TOO FEW TO COUNT, SMEAR AVAILABLE FOR REVIEW  Crypto Ag     NEGATIVE NEGATIVE  Cryptococcal Ag Titer     NOT INDICATED NOT INDICATED  Glucose, CSF     40 - 70 mg/dL 87 (H)  Total  Protein, CSF     15 - 45 mg/dL 21    PHYSICAL EXAM General - Well nourished, well developed, in no apparent  distress.  Ophthalmologic - Sharp disc margins OU.  Cardiovascular - Regular rate and rhythm with no murmur.  Mental Status -  Level of arousal and orientation to time, place, and person were intact. Language including expression, naming, repetition, comprehension was assessed and found intact. Attention span and concentration were normal..  Cranial Nerves II - XII - II - Visual field intact OU. III, IV, VI - Extraocular movements intact. V - Facial sensation intact bilaterally. VII - Facial movement intact bilaterally. VIII - Hearing & vestibular intact bilaterally. X - Palate elevates symmetrically. XI - Chin turning & shoulder shrug intact bilaterally. XII - Tongue protrusion intact.  Motor Strength - The patient's strength was normal in all extremities and pronator drift was absent.  Bulk was normal and fasciculations were absent.   Motor Tone - Muscle tone was assessed at the neck and appendages and was normal.  Reflexes - The patient's reflexes were 1+ in all extremities and she had no pathological reflexes.  Sensory - Light touch, temperature/pinprick, vibration and proprioception, and Romberg testing were assessed and were symmetrical.    Coordination - The patient had normal movements in the hands and feet with no ataxia or dysmetria.  Tremor was absent.  Gait and Station - not tested due to safety concerns.   ASSESSMENT/PLAN Ms. KAMYIA THOMASON is a 49 y.o. female with history of diabetes mellitus, hypertension, obesity, and hyperlipidemia, presenting with altered mental status, and amnesia. She did not receive IV t-PA due to late presentation and mild deficits.  Stroke: left ACA and right hypothalamus, etiology unknown. CNS vasculitis less likely given CSF results. RCVS is still in the DDx. cardioembolic less likely. Do not feel loop recorder is warranted. However, she needs close neuro follow up to rule out or rule in PACNS or RCVS.   Resultant  Amnesia and impaired  delayed recall  MRI - left ACA infarcts and right hypothalamus infarcts  MRA - motion degraded with no large vessel occlusion or stenosis.  CTA head - right hypothalamus, anterior genu of corpus callosum on the left, left distal ACA distribution infarcts. R A2 high grade stenosis, distal L A3. Also moderate stenosis L M1, mild stenosis L M2, proximal bi PCA. Questionable distal vessel beeding.  Carotid Doppler No significant stenosis   2D Echo  No source of embolus   Venous doppler - negative for DVT  TEE negative for SOE, no PFO.   No indication for loop   LP - no pleocytosis or elevation of protein  LDL 138  HgbA1c 7.2  Negative antithrombin III, C3, C4, HIV, ESR, RPR, lupus anticoagulant, cardiolipin antibody,  C3, C4, CH50, ANCA screen w/ reflex titer, Sjogren's Syndrome Antibodies [SSA & SSB]), DSDNA, Prot C & S  pending prothrombin G20120A,  homocysteine, ANA  VTE prophylaxis - Lovenox Diet Heart Room service appropriate?: Yes; Fluid consistency:: Thin  No antithrombotic prior to admission, now on aspirin 325 mg daily.   Patient counseled to be compliant with her antithrombotic medications  Ongoing aggressive stroke risk factor management  Recommend OP sleep testing for sleep apnea  Therapy recommendations: SNF vs HH. Too high level for CIR  Disposition: home with OP therapies  Patient has a 10-15% risk of having another stroke over the next year, the highest risk is within 2 weeks of the most recent stroke/TIA (risk of having a stroke following a stroke or TIA is the same).  Ongoing risk factor control by Primary Care Physician  Follow-up Stroke Clinic at [facility] with Dr. Rosalin Hawking in 2 months, order placed.  Hypertension  BP trending to normalization  BP goal normotensive  BP stable  No BP meds after admission  Hyperlipidemia  Home meds: Lipitor 20 mg daily prior to admission   LDL 138, goal < 70  Lipitor 20 mg daily prior to  admission changed to 80 mg daily.  Continue statin at discharge  Diabetes  HgbA1c 7.2, goal < 7.0  Uncontrolled  SSI  Other Stroke Risk Factors  ETOH use  Morbid Obesity, Body mass index is 40.23 kg/(m^2).   Other Active Problems  Anemia  Depends on LP result, pt needs to repeat CTA head in 2 months. And she needs to be seen in clinic in about 2 months.   Hospital day # 5   Neurology will sign off. Please call with questions. Pt will follow up with Dr. Erlinda Hong at [facility] in about 2 months. Thanks for the consult.  Rosalin Hawking, MD PhD Stroke Neurology 08/12/2015 3:55 PM    To contact Stroke Continuity provider, please refer to [email or web address] After hours, contact General Neurology

## 2015-08-13 LAB — VARICELLA-ZOSTER BY PCR: VARICELLA-ZOSTER, PCR: NEGATIVE

## 2015-08-13 LAB — VDRL, CSF: SYPHILIS VDRL QUANT CSF: NONREACTIVE

## 2015-08-13 LAB — OLIGOCLONAL BANDS, CSF + SERM

## 2015-08-14 LAB — CSF CULTURE

## 2015-08-14 LAB — CSF CULTURE W GRAM STAIN: Culture: NO GROWTH

## 2015-08-16 LAB — PROTHROMBIN GENE MUTATION

## 2015-08-17 ENCOUNTER — Ambulatory Visit (INDEPENDENT_AMBULATORY_CARE_PROVIDER_SITE_OTHER): Payer: Commercial Managed Care - HMO | Admitting: Physician Assistant

## 2015-08-17 ENCOUNTER — Encounter: Payer: Self-pay | Admitting: Physician Assistant

## 2015-08-17 VITALS — BP 166/82 | HR 56 | Temp 98.7°F | Resp 16 | Ht 62.75 in | Wt 223.4 lb

## 2015-08-17 DIAGNOSIS — I10 Essential (primary) hypertension: Secondary | ICD-10-CM

## 2015-08-17 DIAGNOSIS — I6330 Cerebral infarction due to thrombosis of unspecified cerebral artery: Secondary | ICD-10-CM

## 2015-08-17 MED ORDER — PREMIUM AUTOMATIC BP MONITOR DEVI: 1.0000 | Freq: Every day | Status: AC

## 2015-08-17 NOTE — Progress Notes (Signed)
Annette Munoz  MRN: [id number] DOB: 03-25-1966  Subjective:  Pt presents to clinic for a hospital follow-up.  On 10/22 pt went to the ED with mental status changes and her MRI showed multiple infarcts that they felt were embolic in the left ACA territory and right thalamus. Her labs have been negative for blood disorders, and she did not have a DVT, a fib or PFO to be the cause of her CVA.  She has known HTN, elevated cholesterol and DM and her medications were adjusted in the hospital.  Her daughter put all her medications in a pill box and she has been taking the medications each day from the box.  She takes 5 pills in the am and 2 pills in the pm.  She feels like she is tolerating the medication ok.  She feels like she is back to her baseline from before her hospitalization.  She is having no physical deficits and see feels like her mental status is fine.  She is ready to go back to work.  Her husband asked to come back at the end of the visit and he is concerned about her.  She does not seem to be back to her baseline for her cognition and he is worried about her driving and working.  Prior to the diagnosis pt was slow and having trouble completing her normal daily tasks esp at work.  Husband believes she is still having trouble with some of her normal functions - she seems slower with answering questions and following commands.  Pt does not believe she is having any trouble.  Patient Active Problem List   Diagnosis Date Noted  . Diabetes type 2, uncontrolled (HCC)   . Uncontrolled type 2 diabetes mellitus with circulatory disorder, without long-term current use of insulin (HCC)   . Essential hypertension   . HLD (hyperlipidemia)   . Cerebrovascular accident (CVA) due to thrombosis of cerebral artery (HCC)   . CVA (cerebral infarction) 08/07/2015  . DM (diabetes mellitus), type 2, uncontrolled (HCC) 10/04/2009  . Pure hypercholesterolemia 10/04/2009    Current Outpatient Prescriptions on  File Prior to Visit  Medication Sig Dispense Refill  . aspirin 325 MG tablet Take 1 tablet (325 mg total) by mouth daily. 60 tablet 0  . atorvastatin (LIPITOR) 80 MG tablet Take 1 tablet (80 mg total) by mouth daily at 6 PM. 60 tablet 0  . lisinopril (PRINIVIL,ZESTRIL) 10 MG tablet Take 1 tablet (10 mg total) by mouth daily. 60 tablet 0  . metFORMIN (GLUCOPHAGE XR) 500 MG 24 hr tablet Take 2 tablets (1,000 mg total) by mouth 2 (two) times daily. 120 tablet 0   No current facility-administered medications on file prior to visit.    No Known Allergies  Review of Systems  Neurological: Negative for dizziness and headaches.   Objective:  BP 166/82 mmHg  Pulse 56  Temp(Src) 98.7 F (37.1 C) (Oral)  Resp 16  Ht 5' 2.75" (1.594 m)  Wt 223 lb 6.4 oz (101.334 kg)  BMI 39.88 kg/m2  LMP 08/05/2015  Physical Exam  Constitutional: She is oriented to person, place, and time and well-developed, well-nourished, and in no distress.  HENT:  Head: Normocephalic and atraumatic.  Right Ear: Hearing and external ear normal.  Left Ear: Hearing and external ear normal.  Eyes: Conjunctivae are normal.  Neck: Normal range of motion.  Cardiovascular: Normal rate, regular rhythm and normal heart sounds.   No murmur heard. Pulmonary/Chest: Effort normal and breath sounds  normal.  Musculoskeletal:       Right lower leg: She exhibits no edema.       Left lower leg: She exhibits no edema.  Neurological: She is alert and oriented to person, place, and time. Gait normal.  Skin: Skin is warm and dry.  Psychiatric: Mood, memory, affect and judgment normal.  Vitals reviewed.   Assessment and Plan :  Essential hypertension - Plan: Blood Pressure Monitoring (PREMIUM AUTOMATIC BP MONITOR) DEVI - pt's blood pressure is high today so she will start to monitor her BP - her medications were increased in the hospital and we will recheck in 2 weeks to make sure it has gone down - if not she will need a change in  her medication.  She will bring her BP machine and readings to her next appointment.  Cerebrovascular accident (CVA) due to thrombosis of cerebral artery (HCC) - pt does not seem to be back to her normal cognition - with her husband we agreed that she would not drive for the next 2 weeks but if she wants to do short days at work that is fine.  If anything changes they will RTC for a recheck.    We discussed at length adjustment to her medical condition as well as family members adjustment to the fact that she had a stroke and she wants to get back to normal life and they want to watch over her more than normal.  She is tearful when this is talked about but she nods and agrees with what I have said.    Benny LennertSarah Weber PA-C  [facility] 08/17/2015 12:55 PM

## 2015-08-17 NOTE — Patient Instructions (Signed)
Check BP with your monitor - for the next appointment please bring your readings and bring your cuff for comparison

## 2015-08-25 ENCOUNTER — Telehealth: Payer: Self-pay

## 2015-08-25 ENCOUNTER — Telehealth: Payer: Self-pay | Admitting: Internal Medicine

## 2015-08-25 NOTE — Telephone Encounter (Signed)
Pt is requesting to leave a message for Annette Munoz but would not go into detail  Best number [phone]

## 2015-08-25 NOTE — Telephone Encounter (Signed)
No- I am not available for that

## 2015-08-25 NOTE — Telephone Encounter (Signed)
Daughter is requesting to re establish care for patient.  States that she recently had a stroke and is not happy with the current MD.  Please advise.

## 2015-08-26 NOTE — Telephone Encounter (Signed)
Gave daughter md response

## 2015-08-26 NOTE — Telephone Encounter (Signed)
Left message for pt to call back  °

## 2015-08-27 ENCOUNTER — Ambulatory Visit: Payer: Commercial Managed Care - HMO | Attending: Internal Medicine

## 2015-08-27 ENCOUNTER — Other Ambulatory Visit: Payer: Self-pay | Admitting: Neurology

## 2015-08-27 DIAGNOSIS — I63522 Cerebral infarction due to unspecified occlusion or stenosis of left anterior cerebral artery: Secondary | ICD-10-CM

## 2015-08-27 DIAGNOSIS — F919 Conduct disorder, unspecified: Secondary | ICD-10-CM | POA: Insufficient documentation

## 2015-08-27 DIAGNOSIS — R4189 Other symptoms and signs involving cognitive functions and awareness: Secondary | ICD-10-CM | POA: Insufficient documentation

## 2015-08-27 DIAGNOSIS — R4689 Other symptoms and signs involving appearance and behavior: Secondary | ICD-10-CM

## 2015-08-27 NOTE — Therapy (Signed)
[facility] [address] [place], [name], [address] Phone: [phone]   Fax:  [phone]  Physical Therapy Evaluation  Patient Details  Name: Annette Munoz MRN: [id number] Date of Birth: 10-18-1965 No Data Recorded  Encounter Date: 08/27/2015      PT End of Session - 08/27/15 1126    Visit Number 1  no charge for today, screen only   Number of Visits 1   Date for PT Re-Evaluation 09/26/15   PT Start Time 1102   PT Stop Time 1119   PT Time Calculation (min) 17 min   Activity Tolerance Patient tolerated treatment well   Behavior During Therapy [facility] for tasks assessed/performed      Past Medical History  Diagnosis Date  . Diabetes mellitus without complication ([place])   . Hypertension     Past Surgical History  Procedure Laterality Date  . Cesarean section    . Tee without cardioversion N/A 08/10/2015    Procedure: TRANSESOPHAGEAL ECHOCARDIOGRAM (TEE);  Surgeon: [name], MD;  Location: [facility] ENDOSCOPY;  Service: Cardiovascular;  Laterality: N/A;    There were no vitals filed for this visit.  Visit Diagnosis:  Cognitive and behavioral changes      Subjective Assessment - 08/27/15 1106    Subjective Pt's husband, Annette Munoz, present during session and assisted in providing hx of CVA as pt could not remember what happended during CVA. Three weeks ago, pt was at work and was having difficulty talking and performing job tasks. Pt met husband at son's football game, and he noticed she was not very coordinated as she dropped bag of popcorn and pt fell. Pt was then disoriented the following morning and was not aware what day it was. Pt's husband took her to ED on Saturday. Pt's husband reported pt has still been a little confused since CVA, but feels fine physically.  Pt is not sure why she has PT/OT appointments,as she feels that she is fine physically.                                             Plan - 08/27/15 1123    Clinical Impression Statement No charge for visit today, as PT performed screen. Pt's strength, gait and balance is back to PLOF. Pt's B LE strength 5/5, and she is able to stand on each LE for 7-10 seconds without LOB. Pt's gait speed is 3.20f/sec, which indicates she is able to safely amb. in the community. No charge for visit today. Pt would benefit from speech eval, as she still has cognitive and memory deficts s/p CVA. PT will request order from MD.         Problem List Patient Active Problem List   Diagnosis Date Noted  . Diabetes type 2, uncontrolled ([place]   . Uncontrolled type 2 diabetes mellitus with circulatory disorder, without long-term current use of insulin ([place]   . Essential hypertension   . HLD (hyperlipidemia)   . Cerebrovascular accident (CVA) due to thrombosis of cerebral artery ([place]   . CVA (cerebral infarction) 08/07/2015  . DM (diabetes mellitus), type 2, uncontrolled (HT[place] 10/04/2009  . Pure hypercholesterolemia 10/04/2009    Annette Munoz L 08/27/2015, 11:27 AM  [facility] [address]Phone: [phone]  Fax:  [phone] Name: Annette LINGENFELTERMRN:  [id number] Date of Birth: 1966-01-08   Geoffry Paradise, PT,DPT 08/27/2015 11:27 AM Phone: [phone] Fax: [phone]

## 2015-08-27 NOTE — Telephone Encounter (Signed)
Pt was calling for her daughter and did not know what she needed.  Daughter will call back.

## 2015-08-31 ENCOUNTER — Ambulatory Visit: Payer: Commercial Managed Care - HMO | Admitting: *Deleted

## 2015-09-01 ENCOUNTER — Telehealth: Payer: Self-pay | Admitting: Physician Assistant

## 2015-09-01 NOTE — Telephone Encounter (Signed)
Patient's husband dropped FMLA forms. He is the caretaker of his wife. She was seen on 08/17/2015 for essential hypertension and CVA. Sarah, I am not sure exactly how much care will be needed for this patient and the frequency of flare ups. I put the blank forms in your box on 09/01/2015. [facility] within 5-7 business days.

## 2015-09-02 ENCOUNTER — Ambulatory Visit (INDEPENDENT_AMBULATORY_CARE_PROVIDER_SITE_OTHER): Payer: Commercial Managed Care - HMO | Admitting: Physician Assistant

## 2015-09-02 ENCOUNTER — Encounter: Payer: Self-pay | Admitting: Physician Assistant

## 2015-09-02 VITALS — BP 166/90 | HR 69 | Temp 98.2°F | Resp 16 | Ht 63.0 in | Wt 222.0 lb

## 2015-09-02 DIAGNOSIS — I10 Essential (primary) hypertension: Secondary | ICD-10-CM | POA: Diagnosis not present

## 2015-09-02 DIAGNOSIS — I6330 Cerebral infarction due to thrombosis of unspecified cerebral artery: Secondary | ICD-10-CM

## 2015-09-02 DIAGNOSIS — R413 Other amnesia: Secondary | ICD-10-CM | POA: Diagnosis not present

## 2015-09-02 NOTE — Progress Notes (Signed)
   Annette SanerBetty H Munoz  MRN: [id number] DOB: 01/10/1966  Subjective:  Pt presents to clinic  Some memory problems -  No confusion  Patient Active Problem List   Diagnosis Date Noted  . Diabetes type 2, uncontrolled (HCC)   . Uncontrolled type 2 diabetes mellitus with circulatory disorder, without long-term current use of insulin (HCC)   . Essential hypertension   . HLD (hyperlipidemia)   . Cerebrovascular accident (CVA) due to thrombosis of cerebral artery (HCC)   . CVA (cerebral infarction) 08/07/2015  . DM (diabetes mellitus), type 2, uncontrolled (HCC) 10/04/2009  . Pure hypercholesterolemia 10/04/2009    Current Outpatient Prescriptions on File Prior to Visit  Medication Sig Dispense Refill  . aspirin 325 MG tablet Take 1 tablet (325 mg total) by mouth daily. 60 tablet 0  . atorvastatin (LIPITOR) 80 MG tablet Take 1 tablet (80 mg total) by mouth daily at 6 PM. 60 tablet 0  . Blood Pressure Monitoring (PREMIUM AUTOMATIC BP MONITOR) DEVI 1 Device by Does not apply route daily. 1 Device 0  . lisinopril (PRINIVIL,ZESTRIL) 10 MG tablet Take 1 tablet (10 mg total) by mouth daily. 60 tablet 0  . metFORMIN (GLUCOPHAGE XR) 500 MG 24 hr tablet Take 2 tablets (1,000 mg total) by mouth 2 (two) times daily. 120 tablet 0   No current facility-administered medications on file prior to visit.    No Known Allergies  Review of Systems Objective:  BP 166/90 mmHg  Pulse 69  Temp(Src) 98.2 F (36.8 C)  Resp 16  Ht 5\' 3"  (1.6 m)  Wt 222 lb (100.699 kg)  BMI 39.34 kg/m2  LMP 08/05/2015  Physical Exam  Assessment and Plan :  No diagnosis found.  Benny LennertSarah Weber PA-C  [facility] 09/02/2015 4:11 PM

## 2015-09-02 NOTE — Patient Instructions (Signed)
Increase your lisinopril to 2 pills a day - it is ok to take them once a day  Take medications all together in the morning  No driving until you see the neurologist.

## 2015-09-03 NOTE — Telephone Encounter (Signed)
Forms completed, scanned in as well as faxed over on 09/03/15

## 2015-09-03 NOTE — Telephone Encounter (Signed)
Done.  In the box.

## 2015-09-03 NOTE — Progress Notes (Signed)
Annette Munoz  MRN: [id number] DOB: 12-28-1965  Subjective:  Pt presents to clinic for a 2 week recheck.  She has returned to work and she states no one at work seems concerned about her.  She is with her husband today and he is still really concerned about her memory.  Per patient she feels fine and notices nothing different than normal but she does admit she did not realize anything was wrong when she was having a stroke.  She is ready to drive and frustrated that her family is watching her all the time.  She is having no headaches or dizziness.  She is taking her medications daily per patient recollection.  Husband is concerned because he and daughter have made made a medication box but husband still sees her taking medication from the bottles.  He is concerned she is not taking her medication correctly.  He found pills on the floor - she states she knew she dropped them but did not want to pick them up.  She states she is dealing emotionally to what happened to her but husband states that he is having a problem and is very worried about her.  He states that she does not seem to make improvements in the last 2 weeks since her last OV.  Patient Active Problem List   Diagnosis Date Noted  . Diabetes type 2, uncontrolled (HCC)   . Uncontrolled type 2 diabetes mellitus with circulatory disorder, without long-term current use of insulin (HCC)   . Essential hypertension   . HLD (hyperlipidemia)   . Cerebrovascular accident (CVA) due to thrombosis of cerebral artery (HCC)   . CVA (cerebral infarction) 08/07/2015  . DM (diabetes mellitus), type 2, uncontrolled (HCC) 10/04/2009  . Pure hypercholesterolemia 10/04/2009    Current Outpatient Prescriptions on File Prior to Visit  Medication Sig Dispense Refill  . aspirin 325 MG tablet Take 1 tablet (325 mg total) by mouth daily. 60 tablet 0  . atorvastatin (LIPITOR) 80 MG tablet Take 1 tablet (80 mg total) by mouth daily at 6 PM. 60 tablet 0  .  Blood Pressure Monitoring (PREMIUM AUTOMATIC BP MONITOR) DEVI 1 Device by Does not apply route daily. 1 Device 0  . lisinopril (PRINIVIL,ZESTRIL) 10 MG tablet Take 1 tablet (10 mg total) by mouth daily. 60 tablet 0  . metFORMIN (GLUCOPHAGE XR) 500 MG 24 hr tablet Take 2 tablets (1,000 mg total) by mouth 2 (two) times daily. 120 tablet 0   No current facility-administered medications on file prior to visit.    No Known Allergies  Review of Systems  Psychiatric/Behavioral: Positive for decreased concentration. Negative for confusion.   Objective:  BP 166/90 mmHg  Pulse 69  Temp(Src) 98.2 F (36.8 C)  Resp 16  Ht  (1.6 m)  Wt 222 lb (100.699 kg)  BMI 39.34 kg/m2  LMP 08/05/2015  Physical Exam  Constitutional: She is oriented to person, place, and time and well-developed, well-nourished, and in no distress.  HENT:  Head: Normocephalic and atraumatic.  Right Ear: Hearing and external ear normal.  Left Ear: Hearing and external ear normal.  Eyes: Conjunctivae are normal.  Neck: Normal range of motion.  Cardiovascular: Normal rate, regular rhythm and normal heart sounds.   No murmur heard. Pulmonary/Chest: Effort normal.  Neurological: She is alert and oriented to person, place, and time. Gait normal.  Skin: Skin is warm and dry.  Psychiatric: Mood, affect and judgment normal.  Pt asks multiple times about her  ability to drive and when she can go back.  She asks multiple times when her neurology appt is.  She does not seem confused during the visit.  Vitals reviewed.   Assessment and Plan :  Essential hypertension  Cerebrovascular accident (CVA) due to thrombosis of cerebral artery (HCC)  Memory problem  Pt is not allowed to drive until she sees a neurologist.  I am concerned about her memory.  I am sure stress is playing a role but she asked multiple questions multiple times during her visit.  I think that husband has some good concerns.  She is still ok to work.   Husband needs to plan better for the day for her meals and we will change her medications to once a day and her bottles will be taken away and her husband will give her her medications in the am.  Her BP is elevated today and the patient swears she takes her BP medication every day so we will increase her Lisinopril to 20mg  qd.  I hope that her once daily dosing will allow that stress to be relieved for her husband.  She will recheck with me in 2 weeks.  She has an appt with neuro in a month.  Benny LennertSarah Nayellie Sanseverino PA-C  [facility] 09/03/2015 10:04 AM

## 2015-09-08 LAB — FUNGUS CULTURE W SMEAR: FUNGAL SMEAR: NONE SEEN

## 2015-09-14 ENCOUNTER — Telehealth: Payer: Self-pay | Admitting: Physician Assistant

## 2015-09-14 NOTE — Telephone Encounter (Signed)
She states she went but they stated she needs a doctor's note, and yes her husband is taking her to work. She needs it to go back tomorrow.

## 2015-09-14 NOTE — Telephone Encounter (Signed)
My understanding is that she has been back at work because I wrote her husband a note to be able to drive her to work.  She is still not able to drive though.

## 2015-09-14 NOTE — Telephone Encounter (Signed)
RTW note needed for patient.  [id number]

## 2015-09-16 ENCOUNTER — Other Ambulatory Visit: Payer: Self-pay

## 2015-09-16 MED ORDER — GLUCOSE BLOOD VI STRP: ORAL_STRIP | Status: AC

## 2015-09-16 MED ORDER — ALCOHOL PREP PADS: MEDICATED_PAD | Status: AC

## 2015-09-16 MED ORDER — LANCING DEVICE MISC: Status: AC

## 2015-09-16 MED ORDER — LANCETS MISC: Status: AC

## 2015-09-16 MED ORDER — BLOOD GLUCOSE MONITOR KIT: PACK | Status: AC

## 2015-09-17 NOTE — Telephone Encounter (Signed)
I wrote a letter for half days until she sees the neurologist who can further clear her for full time work.

## 2015-09-17 NOTE — Telephone Encounter (Signed)
Pt notified, letter ready to pick up. 

## 2015-09-22 ENCOUNTER — Ambulatory Visit: Payer: Commercial Managed Care - HMO

## 2015-09-23 ENCOUNTER — Ambulatory Visit: Payer: 59 | Admitting: Physician Assistant

## 2015-09-24 ENCOUNTER — Other Ambulatory Visit: Payer: Self-pay | Admitting: Physician Assistant

## 2015-09-27 ENCOUNTER — Ambulatory Visit: Payer: Commercial Managed Care - HMO | Admitting: Internal Medicine

## 2015-09-27 NOTE — Telephone Encounter (Signed)
Annette Munoz, I saw at last OV, you instr'd pt to take all meds in AM only and husband administering them. Wasn't sure what the sig should read for RF of metformin?

## 2015-09-28 ENCOUNTER — Ambulatory Visit: Payer: Commercial Managed Care - HMO | Admitting: Speech Pathology

## 2015-09-30 ENCOUNTER — Telehealth: Payer: Self-pay | Admitting: Neurology

## 2015-09-30 ENCOUNTER — Encounter: Payer: Self-pay | Admitting: Neurology

## 2015-09-30 ENCOUNTER — Ambulatory Visit (INDEPENDENT_AMBULATORY_CARE_PROVIDER_SITE_OTHER): Payer: Commercial Managed Care - HMO | Admitting: Neurology

## 2015-09-30 VITALS — BP 185/82 | HR 57 | Ht 62.0 in | Wt 220.4 lb

## 2015-09-30 DIAGNOSIS — R002 Palpitations: Secondary | ICD-10-CM | POA: Diagnosis not present

## 2015-09-30 DIAGNOSIS — I10 Essential (primary) hypertension: Secondary | ICD-10-CM | POA: Insufficient documentation

## 2015-09-30 DIAGNOSIS — I63522 Cerebral infarction due to unspecified occlusion or stenosis of left anterior cerebral artery: Secondary | ICD-10-CM | POA: Diagnosis not present

## 2015-09-30 DIAGNOSIS — E785 Hyperlipidemia, unspecified: Secondary | ICD-10-CM

## 2015-09-30 DIAGNOSIS — E1159 Type 2 diabetes mellitus with other circulatory complications: Secondary | ICD-10-CM

## 2015-09-30 DIAGNOSIS — E669 Obesity, unspecified: Secondary | ICD-10-CM

## 2015-09-30 MED ORDER — LISINOPRIL 20 MG PO TABS: 10.0000 mg | ORAL_TABLET | Freq: Every day | ORAL | Status: DC

## 2015-09-30 NOTE — Telephone Encounter (Signed)
Pt called sts she forgot to get note to return to work on Wednesday 10/06/15. Please call when it is ready and she will pick it up

## 2015-09-30 NOTE — Progress Notes (Signed)
STROKE NEUROLOGY FOLLOW UP NOTE  NAME: LARON ANGELINI DOB: [birth year]  REASON FOR VISIT: stroke follow up HISTORY FROM: pt and chart  Today we had the pleasure of seeing ROSCHELLE CALANDRA in follow-up at our Neurology Clinic. Pt was accompanied by husband.   History Summary Ms. HANNIA MATCHETT is a 49 y.o. female with history of DM, HTN, obesity, HLD was admitted on 08/07/15 for AMS and amnesia. 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 MRA and CTA showed left ACA, right ACA, left M1, M2, and b/l PCA mild to moderate stenosis. Had LP and negative for vasculitis. CUS and TTE as well as TEE were unremarkable. Hypercoagulable and autoimmue work up negative. LDL 138 and A1C 7.2. Her symptoms resolved and she was discharged with ASA and lipitor.  Interval History During the interval time, the patient has been doing well. No recurrent stroke like symptoms. However, she did not check BP at home and her glucometer does not work. BP today 185/82. As per husband, she still has short term memory deficit. Pt not sure if she is on lisinopril which is on her medication list.   REVIEW OF SYSTEMS: Full 14 system review of systems performed and notable only for those listed below and in HPI above, all others are negative:  Constitutional:   Cardiovascular:  Ear/Nose/Throat:   Skin:  Eyes:   Respiratory:   Gastroitestinal:   Genitourinary:  Hematology/Lymphatic:   Endocrine:  Musculoskeletal:   Allergy/Immunology:   Neurological:   Psychiatric:  Sleep:   The following represents the patient's updated allergies and side effects list: No Known Allergies  The neurologically relevant items on the patient's problem list were reviewed on today's visit.  Neurologic Examination  A problem focused neurological exam (12 or more points of the single system neurologic examination, vital signs counts as 1 point, cranial nerves count for 8 points)  was performed.  Blood pressure 185/82, pulse 57, height  (1.575 m), weight 220 lb 6.4 oz (99.973 kg), last menstrual period 09/27/2015.  General - obese, well developed, in no apparent distress.  Ophthalmologic - Fundi not visualized due to eye movement.  Cardiovascular - Regular rate and rhythm.  Mental Status -  Level of arousal and orientation to time, place, and person were intact. Language including expression, naming, repetition, comprehension was assessed and found intact. Fund of Knowledge was assessed and was intact.  Cranial Nerves II - XII - II - Visual field intact OU. III, IV, VI - Extraocular movements intact. V - Facial sensation intact bilaterally. VII - Facial movement intact bilaterally. VIII - Hearing & vestibular intact bilaterally. X - Palate elevates symmetrically. XI - Chin turning & shoulder shrug intact bilaterally. XII - Tongue protrusion intact.  Motor Strength - The patient's strength was normal in all extremities and pronator drift was absent.  Bulk was normal and fasciculations were absent.   Motor Tone - Muscle tone was assessed at the neck and appendages and was normal.  Reflexes - The patient's reflexes were 1+ in all extremities and she had no pathological reflexes.  Sensory - Light touch, temperature/pinprick, vibration and proprioception, and Romberg testing were assessed and were normal.    Coordination - The patient had normal movements in the hands and feet with no ataxia or dysmetria.  Tremor was absent.  Gait and Station - The patient's transfers, posture, gait, station, and turns were observed as normal.  Data reviewed: I personally  reviewed the images and agree with the radiology interpretations.  Ct Head Wo Contrast 08/07/2015  Focal low density in the left frontal lobe white matter which may be in the corpus callosum. Acute infarct is not excluded.   Mr [name] Head/brain Wo Cm 08/07/2015  1. Multiple small, acute left  ACA territory infarcts involving the corpus callosum, cingulate gyrus, and superior left frontal lobe.  2. Small acute infarct involving the anteroinferior right thalamus/cerebral peduncle.  3. Moderately to severely motion degraded head MRA without evidence of large vessel occlusion or flow limiting proximal stenosis. Suggestion of A2 ACA segmental narrowing.   CTA head  08/10/2015 1. Infarcts involving the right hypothalamus, anterior genu of corpus callosum on the left, left distal ACA distribution. 2. A high-grade stenosis in proximal right A2 segment and more distal left A3 segment, within the left pericallosal artery. 3. Diffuse attenuation of distal ACA branch vessels, worse on the left. 4. Moderate proximal more mild distal left M1 segment stenoses. 5. Moderate proximal left M2 stenoses beyond the bifurcation. 6. Mild attenuation of distal MCA branch vessels bilaterally. 7. Moderate proximal PCA stenoses bilaterally with significant attenuation of distal small vessels.  Carotid Doppler 1-39% ICA plaquing. Vertebral artery flow is antegrade.  2D Echocardiogram  - Left ventricle: The cavity size was normal. There was mildconcentric hypertrophy. Systolic function was normal. Theestimated ejection fraction was in the range of 55% to 60%. Wallmotion was normal; there were no regional wall motionabnormalities. - Left atrium: The atrium was mildly dilated. - Atrial septum: No defect or patent foramen ovale was identified.  LE venous dopplers negative for DVT  TEE  1. No LAA thrombus 2. Negative for PFO 3. No significant valvular disease 4. LVEF 55-60%  CSF Component     Latest Ref Rng 08/11/2015         2:56 PM  Tube #      1  Color, CSF     COLORLESS COLORLESS  Appearance, CSF     CLEAR CLEAR  Supernatant      NOT INDICATED  RBC Count, CSF     0 /cu mm 48 (H)  WBC, CSF     0 - 5 /cu mm 1  Other Cells, CSF      TOO FEW TO COUNT, SMEAR AVAILABLE FOR REVIEW    Specimen Description        Special Requests        Gram Stain        Culture        Report Status        Fungal Smear        Crypto Ag     NEGATIVE NEGATIVE  Cryptococcal Ag Titer     NOT INDICATED NOT INDICATED  Glucose, CSF     40 - 70 mg/dL 87 (H)  Total  Protein, CSF     15 - 45 mg/dL 21  VDRL Quant, CSF     Non Rea:<1:1 Non Reactive  CSF Oligoclonal Bands      Comment   Component     Latest Ref Rng 08/11/2015         2:57 PM  Tube #        Color, CSF     COLORLESS   Appearance, CSF     CLEAR   Supernatant        RBC Count, CSF     0 /cu mm   WBC, CSF     0 - 5 /  cu mm   Other Cells, CSF        Specimen Description      CSF  Special Requests      NONE  Gram Stain      WBC PRESENT, PREDOMINANTLY MONONUCLEAR . . .  Culture      NO GROWTH 3 DAYS  Report Status      08/14/2015 FINAL  Fungal Smear        Crypto Ag     NEGATIVE   Cryptococcal Ag Titer     NOT INDICATED   Glucose, CSF     40 - 70 mg/dL   Total  Protein, CSF     15 - 45 mg/dL   VDRL Quant, CSF     Non Rea:<1:1   CSF Oligoclonal Bands         Component     Latest Ref Rng 08/11/2015         2:58 PM  Tube #        Color, CSF     COLORLESS   Appearance, CSF     CLEAR   Supernatant        RBC Count, CSF     0 /cu mm   WBC, CSF     0 - 5 /cu mm   Other Cells, CSF        Specimen Description      CSF  Special Requests      NONE  Gram Stain        Culture      No Fungi Isolated in 4 Weeks . . .  Report Status      09/08/2015 FINAL  Fungal Smear      NO YEAST OR FUNGAL ELEMENTS SEEN . . .  Crypto Ag     NEGATIVE   Cryptococcal Ag Titer     NOT INDICATED   Glucose, CSF     40 - 70 mg/dL   Total  Protein, CSF     15 - 45 mg/dL   VDRL Quant, CSF     Non Rea:<1:1   CSF Oligoclonal Bands         Component     Latest Ref Rng 08/08/2015 08/10/2015 08/11/2015  Cholesterol     0 - 200 mg/dL 962 (H)    Triglycerides     <150 mg/dL 952    HDL Cholesterol      >40 mg/dL 43    Total CHOL/HDL Ratio      4.8    VLDL     0 - 40 mg/dL 25    LDL (calc)     0 - 99 mg/dL 841 (H)    PTT Lupus Anticoagulant     0.0 - 40.6 sec  35.4   DRVVT     0.0 - 44.0 sec  34.0   Lupus Anticoag Interp       Comment:   Anticardiolipin IgG     0 - 14 GPL U/mL  <9   Anticardiolipin IgM     0 - 12 MPL U/mL  <9   Anticardiolipin IgA     0 - 11 APL U/mL  <9   Beta-2 Glyco I IgG     0 - 20 GPI IgG units  <9   Beta-2-Glycoprotein I IgM     0 - 32 GPI IgM units  <9   Beta-2-Glycoprotein I IgA     0 - 25 GPI IgA units  <9  C-ANCA     Neg:<1:20 titer  <1:20   P-ANCA     Neg:<1:20 titer  <1:20   Atypical P-ANCA titer     Neg:<1:20 titer  <1:20   Hemoglobin A1C     4.8 - 5.6 % 7.2 (H)    Mean Plasma Glucose      160    Recommendations-PTGENE:       Comment   Additional Information       Comment   Source Varicella-Zoster, PCR        BLOOD  Varicella-Zoster, PCR     Negative   Negative  ANA Ab, IFA       Negative   C3 Complement     82 - 167 mg/dL  191   Complement C4, Body Fluid     14 - 44 mg/dL  31   Compl, Total (YN82)     42 - 60 U/mL  > 60   HIV     Non Reactive  Non Reactive   Antithrombin Activity     75 - 120 %  103   Protein C Activity     73 - 180 %  152   Protein C, Total     60 - 150 %  131   Protein S Activity     63 - 140 %  85   Protein S Ag, Total     60 - 150 %  110   Homocysteine     0.0 - 15.0 umol/L  7.4   RPR     Non Reactive  Non Reactive   Sed Rate     0 - 22 mm/hr  46 (H)   ds DNA Ab     0 - 9 IU/mL  <1   SSA (Ro) (ENA) Antibody, IgG     0.0 - 0.9 AI  <0.2   SSB (La) (ENA) Antibody, IgG     0.0 - 0.9 AI  <0.2   Varicella     Immune >165 index  2055     Assessment: As you may recall, she is a 49 y.o. African American female with PMH of DM, HTN, obesity, HLD was admitted on 08/07/15 for AMS and amnesia. 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  MRA and CTA showed left ACA, right ACA, left M1, M2, and b/l PCA mild to moderate stenosis. Had LP and negative for vasculitis. CUS and TTE as well as TEE were unremarkable. Hypercoagulable and autoimmue work up negative. LDL 138 and A1C 7.2. Her symptoms resolved and she was discharged with ASA and lipitor. During the interval time, no recurrent stroke like symptoms. However, BP not in control. Will do 30 day cardiac monitoring and TCD MES, consider RESPECT ESUS.   Plan:  - continue ASA and lipitor now for stroke prevention - 30 day cardiac monitoring to rule out Afib - TCD emboli detection  - check BP and glucose at home - Follow up with your primary care physician for stroke risk factor modification. Recommend maintain blood pressure goal <130/80, diabetes with hemoglobin A1c goal below 6.5% and lipids with LDL cholesterol goal below 70 mg/dL.  - introduce the RESPECT ESUS research study - healthy diet and regular exercise and lose weight - follow up in 2 months  I spent more than 25 minutes of face to face time with the patient. Greater than 50% of time was spent in counseling and coordination of care. We  have discussed about further stroke work up and compliance with medication   Orders Placed This Encounter  Procedures  . Cardiac event monitor    Standing Status: Future     Number of Occurrences:      Standing Expiration Date: 09/30/2016    Scheduling Instructions:     Request cardionet setup. Thank you.    Order Specific Question:  Where should this test be performed?    Answer:  [facility]    Meds ordered this encounter  Medications  . lisinopril (PRINIVIL,ZESTRIL) 20 MG tablet    Sig: Take 0.5 tablets (10 mg total) by mouth daily.    Dispense:  90 tablet    Refill:  3    Patient Instructions  - continue ASA and lipitor now for stroke prevention - will do 30 day cardiac monitoring to rule out Afib - will do TCD emboli detection  - check BP and glucose at home -  Follow up with your primary care physician for stroke risk factor modification. Recommend maintain blood pressure goal <130/80, diabetes with hemoglobin A1c goal below 6.5% and lipids with LDL cholesterol goal below 70 mg/dL.  - introduce the RESPECT ESUS research study - healthy diet and regular exercise and lose weight - follow up in 2 months    Marvel PlanJindong Dois Juarbe, MD PhD [facility] [address], [address] [place], [name] [address] [phone]

## 2015-09-30 NOTE — Telephone Encounter (Signed)
Rn call patient and she needs a note to return to work on 10-06-15. Rn stated the note will be ready tomorrow before 12pm. Rn stated the note will be in an envelope at the front desk. Rn stated we close at 12pm on Fridays.

## 2015-09-30 NOTE — Patient Instructions (Signed)
-   continue ASA and lipitor now for stroke prevention - will do 30 day cardiac monitoring to rule out Afib - will do TCD emboli detection  - check BP and glucose at home - Follow up with your primary care physician for stroke risk factor modification. Recommend maintain blood pressure goal <130/80, diabetes with hemoglobin A1c goal below 6.5% and lipids with LDL cholesterol goal below 70 mg/dL.  - introduce the RESPECT ESUS research study - healthy diet and regular exercise and lose weight - follow up in 2 months

## 2015-10-01 NOTE — Telephone Encounter (Signed)
Called pt cell phone [phone] but nobody picked up the phone. I called her home number at [phone] and her daughter at home stated that her mom is at work and gave me her cell phone number as the contact.   I also talked to her yesterday at the clinic visit and she stated that she has been back to work. So I am somehow confused about the letter and she would like to start to work on 10/06/15.   If she calls back, please clarify the information. Thanks.  Marvel PlanJindong Joyous Gleghorn, MD PhD Stroke Neurology 10/01/2015 1:44 PM

## 2015-10-04 ENCOUNTER — Telehealth: Payer: Self-pay

## 2015-10-04 ENCOUNTER — Telehealth: Payer: Self-pay | Admitting: Neurology

## 2015-10-04 NOTE — Telephone Encounter (Signed)
Patient is calling as she needs a letter releasing her to go back to work for her employer [employer].  Please call.  Thanks!

## 2015-10-04 NOTE — Telephone Encounter (Signed)
I spoke to the patient in regards to the Respect-ESUS study. The patient is interested in participating. She denied a history of cancer or GI bleed. A screening visit was scheduled for Wednesday, [id number] at 14:30h. Patient voiced understanding.

## 2015-10-04 NOTE — Telephone Encounter (Signed)
Rn call Kathie RhodesBetty about needing a letter for work. Rn stated a call was made by Dr. Roda ShuttersXu on Friday,and her family said she was at work. Pt stated she was at her full time job. Pt stated the letter is needed for her to return to her part time job. Rn explain that a letter was available for her on Friday. Pt stated she was unable to make it to [employer]. Rn stated the letter will be in an envelope at the front desk.

## 2015-10-07 ENCOUNTER — Ambulatory Visit: Payer: Commercial Managed Care - HMO | Attending: Internal Medicine | Admitting: Speech Pathology

## 2015-10-07 DIAGNOSIS — I639 Cerebral infarction, unspecified: Secondary | ICD-10-CM | POA: Insufficient documentation

## 2015-10-07 DIAGNOSIS — R41841 Cognitive communication deficit: Secondary | ICD-10-CM | POA: Insufficient documentation

## 2015-10-07 DIAGNOSIS — IMO0002 Reserved for concepts with insufficient information to code with codable children: Secondary | ICD-10-CM

## 2015-10-07 NOTE — Therapy (Signed)
[facility] [address] [place], [place], [address] Phone: [phone]   Fax:  [phone]  Speech Language Pathology Evaluation  Patient Details  Name: Annette Munoz MRN: [id number] Date of Birth: 15-Jun-1966 Referring Provider: Marvel Plan MD  Encounter Date: 10/07/2015      End of Session - 10/07/15 1312    Visit Number 1   Number of Visits 17   Date for SLP Re-Evaluation 12/02/15   SLP Start Time 1225   SLP Stop Time  1312   SLP Time Calculation (min) 47 min   Activity Tolerance Patient tolerated treatment well      Past Medical History  Diagnosis Date  . Diabetes mellitus without complication (HCC)   . Hypertension   . Stroke [facility])     Past Surgical History  Procedure Laterality Date  . Cesarean section    . Tee without cardioversion N/A 08/10/2015    Procedure: TRANSESOPHAGEAL ECHOCARDIOGRAM (TEE);  Surgeon: Chrystie Nose, MD;  Location: [facility] ENDOSCOPY;  Service: Cardiovascular;  Laterality: N/A;    There were no vitals filed for this visit.  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 No/denies            SLP Evaluation [facility] - 10/07/15 1229    SLP Visit Information   SLP Received On 10/07/15   Referring Provider Marvel Plan MD   Onset Date 08/07/15   Medical Diagnosis CVA   Subjective   Patient/Family Stated Goal Doesn't know why she is in speech therapy   General Information   HPI Annette Munoz is a 49 y.o. female with history of DM, HTN, obesity, HLD was admitted on 08/07/15 for AMS and amnesia. 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 MRA and CTA showed left ACA, right ACA, left M1, M2, and b/l PCA mild to moderate  stenosis. Had LP and negative for vasculitis. CUS and TTE as well as TEE were unremarkable. Hypercoagulable and autoimmue work up negative. LDL 138 and A1C 7.2. Her symptoms resolved and she was discharged with ASA and lipitor.[name] is a 49 y.o. female with history of DM, HTN, obesity, HLD was admitted on 08/07/15 for AMS and amnesia. 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 MRA and CTA showed left ACA, right ACA, left M1, M2, and b/l PCA mild to moderate stenosis. Had LP and negative for vasculitis. CUS and TTE as well as TEE were unremarkable. Hypercoagulable and autoimmue work up negative. LDL 138 and A1C 7.2. Her symptoms resolved and she was discharged with ASA and lipitor.   Mobility Status wallking independenly - denies falls   Prior Functional Status   Cognitive/Linguistic Baseline Within functional limits   Type of Home House    Lives With Spouse;Son   Available Support Family   Vocation Full time employment   Cognition   Overall Cognitive Status Impaired/Different from baseline   Area of Impairment Memory;Awareness;Attention   Current Attention Level Sustained   Memory Decreased short-term memory   Awareness Intellectual  impaired   Attention Sustained   Memory Impaired   Awareness Impaired   Awareness Impairment Intellectual impairment   Clinical biochemist  Standardized Assessments   Standardized Assessments  Montreal Cognitive Assessment [facility](MOCA)                         SLP Education - 10/07/15 1311    Education provided Yes   Education Details Areas of cognitive impairment, compensations for memory, cognitive activities to do at home, goals of therapy   Person(s) Educated Patient;Child(ren)   Methods Explanation;Verbal cues;Handout   Comprehension Verbalized understanding;Verbal cues required;Need further instruction          SLP Short Term Goals - 10/07/15 1404     SLP SHORT TERM GOAL #1   Title Pt will selectivley attentd to cognitive task for 10 minutes in distracting environment with less than 2 redirections   Time 4   Period Weeks   Status New   SLP SHORT TERM GOAL #2   Title Pt will solve simple time, money reasoning problems with 80% accuracy and rare min A.   Time 4   Period Weeks   Status New   SLP SHORT TERM GOAL #3   Title Pt will verbalize 3 cognitive impairments with occasional min A   Time 4   Period Weeks   Status New   SLP SHORT TERM GOAL #4   Title Pt will utilize external aid for schedule and medication management over 3 sessions with rare min cues.   Time 4   Period Weeks   Status New          SLP Long Term Goals - 10/07/15 1419    SLP LONG TERM GOAL #1   Title Pt will demonstrate emergent awareness on cognitive tasks with rare min A   Time 8   Period Weeks   Status New   SLP LONG TERM GOAL #2   Title Pt will alternate attention between 2 simple cogntive linguistic tasks with 80% on each and occasional min A   Time 8   Period Weeks   Status New   SLP LONG TERM GOAL #3   Title Pt will solve mildly complex reasoning, organization, time and money problems with 85% accuracy and rare min A   Time 8   Period Weeks   Status New   SLP LONG TERM GOAL #4   Title Pt will demonstrate emergent awareness during cognitive tasks with rare min A   Time 8   Period Weeks   Status New          Plan - 10/07/15 1313    Clinical Impression Statement Annette Munoz, a 49 y.o. female is s/p CVA on 08/07/15. She is referred due to ongoing memory impairment. She presents today with moderate cognitive linuguistic impairments including memory, attention and awareness. Annette Munoz scored a 16/30 on the Montreal Cognitive Assessment (MoCA) with 26/[facility] being [facility].  She recalled 3/5 words immediately and 0/5 words with a delay. Annette Munoz did not repeat sentences exaactly and completed 1/5 seiral 7 subtractions indicating reduced sustained  attention and reduced attention to details. She demonstrated reduced executive function skills with poor organization of trail  task and poor awareness of errors on this task and design copy tasks. When presented with check writing/simple balancing task, Mrs. Raina MinaYourse was easily distracted by conversation, handed this sheet back with only 1/3 checks completed, indicating poor selective and alernating attention. Mrs. Pedregon states that she has no difficulty remembering appointments and medications, however her daughter reports several specific instances when her mother has been recently surprised by an appointment  and forgotten meds. 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 I recommend skilled ST to maximize cognitive skills  for safetly, independence and to reduce caregiver burden.    Speech Therapy Frequency 2x / week   Duration --  8 weeks   Treatment/Interventions SLP instruction and feedback;Cognitive reorganization;Internal/external aids;Compensatory strategies;Patient/family education;Functional tasks   Potential to Achieve Goals Good   Potential Considerations Ability to learn/carryover information        Problem List Patient Active Problem List   Diagnosis Date Noted  . Palpitations 09/30/2015  . Benign essential HTN 09/30/2015  . Type 2 diabetes mellitus with circulatory disorder (HCC) 09/30/2015  . Obesity 09/30/2015  . Diabetes type 2, uncontrolled (HCC)   . Uncontrolled type 2 diabetes mellitus with circulatory disorder, without long-term current use of insulin (HCC)   . Essential hypertension   . HLD (hyperlipidemia)   . Cerebrovascular accident (CVA) due to thrombosis of cerebral artery (HCC)   . CVA (cerebral infarction) 08/07/2015  . DM (diabetes mellitus), type 2, uncontrolled (HCC) 10/04/2009  . Pure hypercholesterolemia 10/04/2009    Gari Trovato, [name] MS, CCC-SLP 10/07/2015, 2:20 PM  [facility] [address] [place], [place], [address] Phone: [phone]   Fax:  [phone]  Name: LIDDY DEAM MRN: [id number] Date of Birth: 1966/09/08

## 2015-10-07 NOTE — Patient Instructions (Signed)
  Cognitive Activities you can do at home:   - Solitaire  - Majong  - Scrabble  - Chess/Checkers  - Crosswords (easy level)  - Juanna CaoUno  - Card Games  - Board Games  - Connect 4  - Simon  - the [employer] WholesaleMemory Game  On your computer, tablet or phone: Geophysical data processorBrainHQ Brainbashers.com Landeuronation App Memory Match Game App Sort it out Occidental PetroleumScrabble pics App Cracker Barrel App  Memory Binder with notebook paper and dividers   Memory Strategies  W - Write it down  A - Associate it with something  R - Repeat it  M - Mental Image    Play the memory game  Try to remember 3-5 items on your store list without looking  Study a detailed picture in a magazine for 1 minute, then write down everything you can remember from the picture

## 2015-10-11 ENCOUNTER — Ambulatory Visit (HOSPITAL_COMMUNITY): Payer: Commercial Managed Care - HMO

## 2015-10-12 ENCOUNTER — Telehealth: Payer: Self-pay | Admitting: Neurology

## 2015-10-12 ENCOUNTER — Other Ambulatory Visit: Payer: Self-pay | Admitting: Neurology

## 2015-10-12 ENCOUNTER — Ambulatory Visit (INDEPENDENT_AMBULATORY_CARE_PROVIDER_SITE_OTHER): Payer: Commercial Managed Care - HMO

## 2015-10-12 DIAGNOSIS — R002 Palpitations: Secondary | ICD-10-CM

## 2015-10-12 DIAGNOSIS — I63522 Cerebral infarction due to unspecified occlusion or stenosis of left anterior cerebral artery: Secondary | ICD-10-CM | POA: Diagnosis not present

## 2015-10-12 DIAGNOSIS — E7800 Pure hypercholesterolemia, unspecified: Secondary | ICD-10-CM

## 2015-10-12 MED ORDER — ATORVASTATIN CALCIUM 80 MG PO TABS: 80.0000 mg | ORAL_TABLET | Freq: Every day | ORAL | Status: DC

## 2015-10-12 NOTE — Telephone Encounter (Signed)
Annette Munoz also requests refill of atorvastatin (LIPITOR) 80 MG tablet

## 2015-10-12 NOTE — Telephone Encounter (Signed)
Khadesjah called regarding lisinopril (PRINIVIL,ZESTRIL) 20 MG tablet, states patient has 10 mg and 20 mg and wonders if she is supposed to be taking both or just one of them.

## 2015-10-12 NOTE — Telephone Encounter (Signed)
It appears Dr Roda ShuttersXu sent Lisinopril 20mg  with instructions of one half tab daily (10 mg).  I called back.  Got no answer.  Left message.

## 2015-10-14 ENCOUNTER — Ambulatory Visit: Payer: Commercial Managed Care - HMO

## 2015-10-14 DIAGNOSIS — IMO0002 Reserved for concepts with insufficient information to code with codable children: Secondary | ICD-10-CM

## 2015-10-14 DIAGNOSIS — I639 Cerebral infarction, unspecified: Secondary | ICD-10-CM | POA: Diagnosis not present

## 2015-10-14 NOTE — Therapy (Signed)
[name], [name], [address] Phone: [phone]   Fax:  [phone]  Speech Language Pathology Treatment  Patient Details  Name: Annette Munoz MRN: [id number] Date of Birth: 04/20/1966 Referring Provider: Marvel PlanJindong Xu MD  Encounter Date: 10/14/2015      End of Session - 10/14/15 1218    Visit Number 2   Number of Visits 17   Date for SLP Re-Evaluation 12/02/15   SLP Start Time 0847   SLP Stop Time  0930   SLP Time Calculation (min) 43 min   Activity Tolerance Patient tolerated treatment well      Past Medical History  Diagnosis Date  . Diabetes mellitus without complication (HCC)   . Hypertension   . Stroke [facility](HCC)     Past Surgical History  Procedure Laterality Date  . Cesarean section    . Tee without cardioversion N/A 08/10/2015    Procedure: TRANSESOPHAGEAL ECHOCARDIOGRAM (TEE);  Surgeon: Chrystie NoseKenneth C Hilty, MD;  [facility];  Service: Cardiovascular;  Laterality: N/A;    There were no vitals filed for this visit.  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 "Oh I have to remember to do that!"   Patient is accompained by: Family member  husband               ADULT SLP TREATMENT - 10/14/15 0858    General Information   Behavior/Cognition Alert;Pleasant mood;Cooperative   Treatment Provided   Treatment provided Cognitive-Linquistic   Pain Assessment   Pain Assessment No/denies pain   Cognitive-Linquistic Treatment   Treatment focused on Cognition   Skilled Treatment Because of pt's "S" statement, SLP discussed memory strategies with pt/husband, specifically association. Reviewed all memory strategies with pt and repeated them, told her of need to recall in 15 minutes later. Pt req'd SLP list of strategies to  recall 45 seconds later (in order to write them down for recall). To assist pt in abilities with reasoning, attention to detail, and problem solving SLP provided two tasks for pt to accomplish using alternating attention. Pt with emergent awareness for 2/17 errors on these two tasks when SLP asked pt if she would like to check her answers. SLP facilitated pt's anticipatory awareness by asking pt what she could take away from today's session for her work environment. She req'd cues for requring compensations (double check work, repeat appointment times back to clients) for attention to detail and only stated she would write down her appointments (which pt did prior to CVA as well).    Assessment / Recommendations / Plan   Plan Continue with current plan of care   Progression Toward Goals   Progression toward goals Progressing toward goals          SLP Education - 10/14/15 1218    Education provided Yes   Education Details deficit areas, compensations for memory and attention/organization   Person(s) Educated Patient;Spouse   Methods Explanation;Demonstration   Comprehension Verbalized understanding;Need further instruction          SLP Short Term Goals - 10/14/15 1220    SLP SHORT TERM GOAL #1   Title Pt will selectivley attentd to cognitive task for 10 minutes in distracting environment with less than 2 redirections   Time 4   Period Weeks   Status On-going   SLP SHORT TERM  GOAL #2   Title Pt will solve simple time, money reasoning problems with 80% accuracy and rare min A.   Time 4   Period Weeks   Status On-going   SLP SHORT TERM GOAL #3   Title Pt will verbalize 3 cognitive impairments with occasional min A   Time 4   Period Weeks   Status On-going   SLP SHORT TERM GOAL #4   Title Pt will utilize external aid for schedule and medication management over 3 sessions with rare min cues.   Time 4   Period Weeks   Status On-going          SLP Long Term Goals - 10/14/15  1220    SLP LONG TERM GOAL #1   Title Pt will demonstrate emergent awareness on cognitive tasks with rare min A   Time 8   Period Weeks   Status On-going   SLP LONG TERM GOAL #2   Title Pt will alternate attention between 2 simple cogntive linguistic tasks with 80% on each and occasional min A   Time 8   Period Weeks   Status On-going   SLP LONG TERM GOAL #3   Title Pt will solve mildly complex reasoning, organization, time and money problems with 85% accuracy and rare min A   Time 8   Period Weeks   Status On-going   SLP LONG TERM GOAL #4   Title Pt will demonstrate emergent awareness during cognitive tasks with rare min A   Time 8   Period Weeks   Status On-going          Plan - 10/14/15 1219    Clinical Impression Statement 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 She has returned to work and SLP hit heavily on compensations she will likely need there, based upon performance in ST today. skillled ST reamins needed to maximize cognitve linguistic skills.   Speech Therapy Frequency 2x / week   Duration --  8 weeks   Treatment/Interventions SLP instruction and feedback;Cognitive reorganization;Internal/external aids;Compensatory strategies;Patient/family education;Functional tasks   Potential to Achieve Goals Good   Potential Considerations Ability to learn/carryover information        Problem List Patient Active Problem List   Diagnosis Date Noted  . Palpitations 09/30/2015  . Benign essential HTN 09/30/2015  . Type 2 diabetes mellitus with circulatory disorder (HCC) 09/30/2015  . Obesity 09/30/2015  . Diabetes type 2, uncontrolled (HCC)   . Uncontrolled type 2 diabetes mellitus with circulatory disorder, without long-term current use of insulin (HCC)   . Essential hypertension   . HLD (hyperlipidemia)   . Cerebrovascular accident (CVA) due to thrombosis of cerebral artery (HCC)   . CVA (cerebral infarction) 08/07/2015  . DM  (diabetes mellitus), type 2, uncontrolled (HCC) 10/04/2009  . Pure hypercholesterolemia 10/04/2009    [facility] , MS, CCC-SLP 10/14/2015, 12:21 PM  [facility] [address] [place], [place], [address] Phone: [phone]   Fax:  [phone]   Name: Annette Munoz MRN: [id number] Date of Birth: [birth year]

## 2015-10-14 NOTE — Patient Instructions (Signed)
Write down things you will need to remember later. Double check detailed work. Write down steps in a multistep task (like for relaxer, as you said) and cross off as you complete each step.

## 2015-10-19 ENCOUNTER — Ambulatory Visit (HOSPITAL_COMMUNITY)
Admission: RE | Admit: 2015-10-19 | Discharge: 2015-10-19 | Disposition: A | Discharge status: Home / Self Care | Payer: Commercial Managed Care - HMO | Source: Ambulatory Visit | Attending: Neurology | Admitting: Neurology

## 2015-10-19 DIAGNOSIS — I63522 Cerebral infarction due to unspecified occlusion or stenosis of left anterior cerebral artery: Secondary | ICD-10-CM | POA: Diagnosis not present

## 2015-10-19 NOTE — Progress Notes (Signed)
*  PRELIMINARY RESULTS* Vascular Ultrasound Transcranial Doppler with Monitoring has been completed.   The study was technically difficult and limited due to small acoustic windows and patient movement. There was no evidence of high intensity transient signals (HITS) heard during the 30 minutes of monitoring, however the signal was lost during a portion of the exam. Therefore, this exam was limited.  10/19/2015 11:51 AM Gertie FeyMichelle Kiyoshi Schaab, RVT, RDCS, RDMS

## 2015-10-25 ENCOUNTER — Ambulatory Visit: Payer: Commercial Managed Care - HMO | Attending: Internal Medicine

## 2015-10-25 DIAGNOSIS — R41841 Cognitive communication deficit: Secondary | ICD-10-CM | POA: Insufficient documentation

## 2015-10-25 DIAGNOSIS — I639 Cerebral infarction, unspecified: Secondary | ICD-10-CM | POA: Insufficient documentation

## 2015-10-27 ENCOUNTER — Encounter: Payer: Commercial Managed Care - HMO | Admitting: Neurology

## 2015-10-27 ENCOUNTER — Telehealth: Payer: Self-pay | Admitting: Neurology

## 2015-10-27 NOTE — Telephone Encounter (Signed)
I left a message for Annette ReevesBetty Munoz.  I asked if she would like to reschedule her Screening Visit for her research appointment.  If so, I asked if she could call me back on my direct number so that I could reschedule her appointment.

## 2015-10-28 ENCOUNTER — Other Ambulatory Visit: Payer: Self-pay | Admitting: Physician Assistant

## 2015-10-28 ENCOUNTER — Ambulatory Visit: Payer: Commercial Managed Care - HMO

## 2015-10-28 DIAGNOSIS — IMO0002 Reserved for concepts with insufficient information to code with codable children: Secondary | ICD-10-CM

## 2015-10-28 DIAGNOSIS — R41841 Cognitive communication deficit: Secondary | ICD-10-CM | POA: Diagnosis present

## 2015-10-28 DIAGNOSIS — I639 Cerebral infarction, unspecified: Secondary | ICD-10-CM | POA: Diagnosis not present

## 2015-10-28 NOTE — Therapy (Signed)
[facility] [address] [place], [name], [address] Phone: [phone]   Fax:  [phone]  Speech Language Pathology Treatment  Patient Details  Name: Annette SanerBetty H Munoz MRN: [id number] Date of Birth: 03/15/1966 Referring Provider: Marvel PlanJindong Xu MD  Encounter Date: 10/28/2015      End of Session - 10/28/15 1032    Visit Number 3   Number of Visits 17   Date for SLP Re-Evaluation 12/02/15   SLP Start Time 0848   SLP Stop Time  0931   SLP Time Calculation (min) 43 min   Activity Tolerance Patient tolerated treatment well      Past Medical History  Diagnosis Date  . Diabetes mellitus without complication (HCC)   . Hypertension   . Stroke [facility](HCC)     Past Surgical History  Procedure Laterality Date  . Cesarean section    . Tee without cardioversion N/A 08/10/2015    Procedure: TRANSESOPHAGEAL ECHOCARDIOGRAM (TEE);  Surgeon: Chrystie NoseKenneth C Hilty, MD;  [facility];  Service: Cardiovascular;  Laterality: N/A;    There were no vitals filed for this visit.  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 She reports taking blood sugar.               ADULT SLP TREATMENT - 10/28/15 0856    General Information   Behavior/Cognition Alert;Pleasant mood;Cooperative   Treatment Provided   Treatment provided Cognitive-Linquistic   Pain Assessment   Pain Assessment No/denies pain   Cognitive-Linquistic Treatment   Treatment focused on Cognition   Skilled Treatment Pt forgot a standing appointment today at 8:30AM. Pt req'd cues for problem solving how to best verify appointments (pt stated making reminder calls, however SLP reminded pt that would not work for appointments she did not write down). 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 In tasks involving attention to detail and emergent awareness, pt req'd mod cues usually for emergent awareness and attention to detail on first stimulus. SLP pointed out to pt the first stimulus was relatively straight forward and pt with decr'd awareness of errors, in attempts to focus pt's attention for intellectual awareness.   Assessment / Recommendations / Plan   Plan Continue with current plan of care   Progression Toward Goals   Progression toward goals Progressing toward goals          SLP Education - 10/28/15 1031    Education provided Yes   Education Details awareness of errors and other awareness    Person(s) Educated Patient   Methods Explanation   Comprehension Verbalized understanding;Need further instruction          SLP Short Term Goals - 10/28/15 0909    SLP SHORT TERM GOAL #1   Title Pt will selectivley attentd to cognitive task for 10 minutes in distracting environment with less than 2 redirections   Time 3   Period Weeks   Status On-going   SLP SHORT TERM GOAL #2   Title Pt will solve simple time, money reasoning problems with 80% accuracy and rare min A.   Time 3   Period Weeks   Status On-going   SLP SHORT TERM GOAL #3   Title Pt will verbalize 3 cognitive impairments with occasional min A   Time 3   Period Weeks  Status On-going   SLP SHORT TERM GOAL #4   Title Pt will utilize external aid for schedule and medication management over 3 sessions with rare min cues.   Time 3   Period Weeks   Status On-going          SLP Long Term Goals - 10/28/15 1033    SLP LONG TERM GOAL #1   Title Pt will demonstrate emergent awareness on cognitive tasks with rare min A   Time 7   Period Weeks   Status On-going   SLP LONG TERM GOAL #2   Title Pt will alternate attention between 2 simple cogntive linguistic tasks with 80% on each and occasional min A   Time 7   Period Weeks   Status On-going   SLP LONG TERM GOAL #3   Title Pt  will solve mildly complex reasoning, organization, time and money problems with 85% accuracy and rare min A   Time 7   Period Weeks   Status On-going   SLP LONG TERM GOAL #4   Title Pt will demonstrate emergent awareness during cognitive tasks with rare min A   Time 7   Period Weeks   Status On-going          Plan - 10/28/15 1033    Clinical Impression Statement 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 She has returned to work and SLP hit heavily on compensations she will likely need there, based upon performance in ST today. skillled ST reamins needed to maximize cognitve linguistic skills.   Speech Therapy Frequency 2x / week   Duration --  7 weeks   Treatment/Interventions SLP instruction and feedback;Cognitive reorganization;Internal/external aids;Compensatory strategies;Patient/family education;Functional tasks   Potential to Achieve Goals Good   Potential Considerations Ability to learn/carryover information        Problem List Patient Active Problem List   Diagnosis Date Noted  . Palpitations 09/30/2015  . Benign essential HTN 09/30/2015  . Type 2 diabetes mellitus with circulatory disorder (HCC) 09/30/2015  . Obesity 09/30/2015  . Diabetes type 2, uncontrolled (HCC)   . Uncontrolled type 2 diabetes mellitus with circulatory disorder, without long-term current use of insulin (HCC)   . Essential hypertension   . HLD (hyperlipidemia)   . Cerebrovascular accident (CVA) due to thrombosis of cerebral artery (HCC)   . CVA (cerebral infarction) 08/07/2015  . DM (diabetes mellitus), type 2, uncontrolled (HCC) 10/04/2009  . Pure hypercholesterolemia 10/04/2009    [facility] , MS, CCC-SLP  10/28/2015, 10:34 AM  [facility] [address] [place], [place], [address] Phone: [phone]   Fax:  [phone]   Name: Annette Munoz MRN: [id number] Date of Birth:  1966-03-22

## 2015-10-28 NOTE — Patient Instructions (Signed)
  Please complete the assigned speech therapy homework and return it to your next session.  

## 2015-11-01 ENCOUNTER — Ambulatory Visit: Payer: Commercial Managed Care - HMO

## 2015-11-01 DIAGNOSIS — I639 Cerebral infarction, unspecified: Secondary | ICD-10-CM | POA: Diagnosis not present

## 2015-11-01 DIAGNOSIS — IMO0002 Reserved for concepts with insufficient information to code with codable children: Secondary | ICD-10-CM

## 2015-11-01 NOTE — Patient Instructions (Signed)
Homework is to put bill dates or "pay bills" on a date on the calendar as a reminder.  Write out a to do list on the refrigerator to keep track of household chores to be done.

## 2015-11-01 NOTE — Therapy (Signed)
[facility] [address] [place], [name], [address] Phone: [phone]   Fax:  [phone]  Speech Language Pathology Treatment  Patient Details  Name: Annette SanerBetty H Munoz MRN: [id number] Date of Birth: 01/06/1966 Referring Provider: Marvel PlanJindong Xu MD  Encounter Date: 11/01/2015      End of Session - 11/01/15 1002    Visit Number 4   Number of Visits 17   Date for SLP Re-Evaluation 12/02/15   SLP Start Time 0849   SLP Stop Time  0932   SLP Time Calculation (min) 43 min   Activity Tolerance Patient tolerated treatment well      Past Medical History  Diagnosis Date  . Diabetes mellitus without complication (HCC)   . Hypertension   . Stroke [facility](HCC)     Past Surgical History  Procedure Laterality Date  . Cesarean section    . Tee without cardioversion N/A 08/10/2015    Procedure: TRANSESOPHAGEAL ECHOCARDIOGRAM (TEE);  Surgeon: Chrystie NoseKenneth C Hilty, MD;  [facility];  Service: Cardiovascular;  Laterality: N/A;    There were no vitals filed for this visit.  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 Pt wihtout scheduling mishaps since last  session. In homework detailed (work hours, checks, calories) pt's attention to detail decr'd and she did not spontaneously double check errors. At session end, husband shared pt is forgetting to pay bills, household chores. SLP and pt/husband talked about compensations to use (writing down) that would be helpful.   Assessment / Recommendations / Plan   Plan Continue with current plan of care   Progression Toward Goals   Progression toward goals Progressing toward goals          SLP Education - 11/01/15 1002    Education provided Yes   Education Details compensations for memory   Person(s) Educated Patient;Spouse   Methods Explanation   Comprehension Verbalized understanding;Need further instruction          SLP Short Term Goals - 11/01/15 1003    SLP SHORT TERM GOAL #1   Title Pt will selectivley attentd to cognitive task for 10 minutes in distracting environment with less than 2 redirections   Time 2   Period Weeks   Status On-going   SLP SHORT TERM GOAL #2   Title Pt will solve simple time, money reasoning problems with 80% accuracy and rare min A.   Time 2   Period Weeks   Status On-going   SLP SHORT TERM GOAL #3   Title Pt will verbalize 3 cognitive impairments with occasional min A   Time 2   Period Weeks   Status On-going   SLP SHORT TERM GOAL #4  Title Pt will utilize external aid for schedule and medication management over 3 sessions with rare min cues.   Time 2   Period Weeks   Status On-going          SLP Long Term Goals - 11/01/15 1003    SLP LONG TERM GOAL #1   Title Pt will demonstrate emergent awareness on cognitive tasks with rare min A   Time 6   Period Weeks   Status On-going   SLP LONG TERM GOAL #2   Title Pt will alternate attention between 2 simple cogntive linguistic tasks with 80% on each and occasional min A   Time 6   Period Weeks   Status On-going   SLP LONG TERM GOAL #3   Title Pt will solve mildly complex reasoning, organization,  time and money problems with 85% accuracy and rare min A   Time 6   Period Weeks   Status On-going   SLP LONG TERM GOAL #4   Title Pt will demonstrate emergent awareness during cognitive tasks with rare min A   Time 6   Period Weeks   Status On-going          Plan - 11/01/15 1002    Clinical Impression Statement 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 Based upon performance in ST today. skillled ST reamins needed to maximize cognitve linguistic skills for home and community.   Speech Therapy Frequency 2x / week   Duration --  6 weeks   Treatment/Interventions SLP instruction and feedback;Cognitive reorganization;Internal/external aids;Compensatory strategies;Patient/family education;Functional tasks   Potential to Achieve Goals Good   Potential Considerations Ability to learn/carryover information        Problem List Patient Active Problem List   Diagnosis Date Noted  . Palpitations 09/30/2015  . Benign essential HTN 09/30/2015  . Type 2 diabetes mellitus with circulatory disorder (HCC) 09/30/2015  . Obesity 09/30/2015  . Diabetes type 2, uncontrolled (HCC)   . Uncontrolled type 2 diabetes mellitus with circulatory disorder, without long-term current use of insulin (HCC)   . Essential hypertension   . HLD (hyperlipidemia)   . Cerebrovascular accident (CVA) due to thrombosis of cerebral artery (HCC)   . CVA (cerebral infarction) 08/07/2015  . DM (diabetes mellitus), type 2, uncontrolled (HCC) 10/04/2009  . Pure hypercholesterolemia 10/04/2009    [facility] , MS, CCC-SLP  11/01/2015, 10:04 AM  [facility] [address] [place], [place], [address] Phone: [phone]   Fax:  [phone]   Name: Annette Munoz MRN: [id number] Date of Birth: 1966/06/03

## 2015-11-02 ENCOUNTER — Ambulatory Visit: Payer: Commercial Managed Care - HMO | Admitting: Speech Pathology

## 2015-11-02 ENCOUNTER — Telehealth: Payer: Self-pay | Admitting: Neurology

## 2015-11-02 DIAGNOSIS — I639 Cerebral infarction, unspecified: Secondary | ICD-10-CM | POA: Diagnosis not present

## 2015-11-02 DIAGNOSIS — IMO0002 Reserved for concepts with insufficient information to code with codable children: Secondary | ICD-10-CM

## 2015-11-02 NOTE — Telephone Encounter (Signed)
My manager asked that I call Annette Munoz. [place] and ask her about her recorded BP readings.  He asked her to record her BP readings over a period of time and wanted me to ask about those readings.  I left a message for her to call me back regarding those readings.

## 2015-11-02 NOTE — Therapy (Signed)
[facility] [address] [place], [place], [address] Phone: [phone]   Fax:  [phone]  Speech Language Pathology Treatment  Patient Details  Name: Annette Munoz MRN: [id number] Date of Birth: [birth year] Referring Provider: Marvel Plan MD  Encounter Date: 11/02/2015      End of Session - 11/02/15 1207    Visit Number 5   Number of Visits 17   Date for SLP Re-Evaluation 12/02/15   SLP Start Time 0845   SLP Stop Time  0929   SLP Time Calculation (min) 44 min      Past Medical History  Diagnosis Date  . Diabetes mellitus without complication (HCC)   . Hypertension   . Stroke [facility])     Past Surgical History  Procedure Laterality Date  . Cesarean section    . Tee without cardioversion N/A 08/10/2015    Procedure: TRANSESOPHAGEAL ECHOCARDIOGRAM (TEE);  Surgeon: Chrystie Nose, MD;  [facility];  Service: Cardiovascular;  Laterality: N/A;    There were no vitals filed for this visit.  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 - when asked why she was is speech therapy, pt required re-educatoin re: cognitive impairments.  Pt instructed to re-read  and double  check. Attention to detail and mldly complex math in mildly distracting environment with  3/5 errors in attention to detail with cues rerquired 2/3 to ID errors.  Math is 100% correct with supervision cues. Spouse reports pt has been confused re: apppointment times, I instructed pt to color code her bills, medical appointments and personal appointments on her calendar and bring this back for [place] to check accuracy.   Assessment / Recommendations / Plan   Plan Continue with current plan of care   Progression Toward Goals   Progression toward goals Progressing toward goals          SLP Education - 11/02/15 1206    Education provided Yes   Education Details compensations for memory   Methods Explanation   Comprehension Verbalized understanding;Need further instruction          SLP Short Term Goals - 11/02/15 1207    SLP SHORT TERM GOAL #1   Title Pt will selectivley attentd to cognitive task for 10 minutes in distracting environment with less than 2 redirections   Time 2   Period Weeks   Status On-going   SLP SHORT TERM GOAL #2   Title Pt will solve simple time, money reasoning problems with 80% accuracy and rare min A.   Time 2   Period Weeks   Status On-going   SLP SHORT TERM GOAL #3   Title Pt will verbalize 3 cognitive impairments with occasional min A   Time 2   Period Weeks   Status On-going   SLP SHORT TERM GOAL #4   Title Pt will utilize  external aid for schedule and medication management over 3 sessions with rare min cues.   Time 2   Period Weeks   Status On-going          SLP Long Term Goals - 11/02/15 1207    SLP LONG TERM GOAL #1   Title Pt will demonstrate emergent awareness on cognitive tasks with rare min A   Time 6   Period Weeks   Status On-going   SLP LONG TERM GOAL #2   Title Pt will alternate attention between 2 simple cogntive linguistic tasks with 80% on each and occasional min A   Time 6   Period Weeks   Status On-going   SLP LONG TERM GOAL #3   Title Pt will solve mildly complex reasoning, organization, time and money problems with 85% accuracy and rare min A   Time 6   Period Weeks   Status  On-going   SLP LONG TERM GOAL #4   Title Pt will demonstrate emergent awareness during cognitive tasks with rare min A   Time 6   Period Weeks   Status On-going          Plan - 11/02/15 1206    Clinical Impression Statement 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 Based upon performance in ST today. skillled ST reamins needed to maximize cognitve linguistic skills for home and community.   Speech Therapy Frequency 2x / week   Treatment/Interventions SLP instruction and feedback;Cognitive reorganization;Internal/external aids;Compensatory strategies;Patient/family education;Functional tasks   Potential to Achieve Goals Good   Potential Considerations Ability to learn/carryover information        Problem List Patient Active Problem List   Diagnosis Date Noted  . Palpitations 09/30/2015  . Benign essential HTN 09/30/2015  . Type 2 diabetes mellitus with circulatory disorder (HCC) 09/30/2015  . Obesity 09/30/2015  . Diabetes type 2, uncontrolled (HCC)   . Uncontrolled type 2 diabetes mellitus with circulatory disorder, without long-term current use of insulin (HCC)   . Essential hypertension   . HLD (hyperlipidemia)   . Cerebrovascular accident (CVA) due to thrombosis of cerebral artery (HCC)   . CVA (cerebral infarction) 08/07/2015  . DM (diabetes mellitus), type 2, uncontrolled (HCC) 10/04/2009  . Pure hypercholesterolemia 10/04/2009    Lovvorn, [name]  MS, CCC-SLP  11/02/2015, 12:08 PM  [facility] [address] [place], [place], [address] Phone: [phone]   Fax:  [phone]   Name: Annette Munoz MRN: [id number] Date of Birth: Nov 12, 1965

## 2015-11-02 NOTE — Patient Instructions (Signed)
  Write in all appointments on calendar Jan, Feb and March  Color code bills, medical appointments, other appointments (nails, hair, car work etc)

## 2015-11-07 ENCOUNTER — Other Ambulatory Visit: Payer: Self-pay | Admitting: Neurology

## 2015-11-08 ENCOUNTER — Ambulatory Visit: Payer: 59 | Admitting: Neurology

## 2015-11-08 ENCOUNTER — Ambulatory Visit: Payer: Commercial Managed Care - HMO

## 2015-11-08 ENCOUNTER — Telehealth: Payer: Self-pay | Admitting: Neurology

## 2015-11-08 DIAGNOSIS — IMO0002 Reserved for concepts with insufficient information to code with codable children: Secondary | ICD-10-CM

## 2015-11-08 DIAGNOSIS — I639 Cerebral infarction, unspecified: Secondary | ICD-10-CM | POA: Diagnosis not present

## 2015-11-08 NOTE — Patient Instructions (Signed)
DO NOT USE THE STOVE UNLESS SOMEONE IS THERE WITH YOU IN THE [place]

## 2015-11-08 NOTE — Therapy (Signed)
[facility] [address] [place], [place], [address] Phone: [phone]   Fax:  [phone]  Speech Language Pathology Treatment  Patient Details  Name: MARGAUX ENGEN MRN: [id number] Date of Birth: 12/01/1965 Referring Provider: Marvel Plan MD  Encounter Date: 11/08/2015      End of Session - 11/08/15 1139    Visit Number 6   Number of Visits 17   Date for SLP Re-Evaluation 12/02/15   SLP Start Time 0935   SLP Stop Time  1017   SLP Time Calculation (min) 42 min   Activity Tolerance Patient tolerated treatment well      Past Medical History  Diagnosis Date  . Diabetes mellitus without complication (HCC)   . Hypertension   . Stroke [facility])     Past Surgical History  Procedure Laterality Date  . Cesarean section    . Tee without cardioversion N/A 08/10/2015    Procedure: TRANSESOPHAGEAL ECHOCARDIOGRAM (TEE);  Surgeon: Chrystie Nose, MD;  Location[facility] ENDOSCOPY;  Service: Cardiovascular;  Laterality: N/A;    There were no vitals filed for this visit.  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 No/denies               ADULT SLP TREATMENT - 11/08/15 0940    General Information   Behavior/Cognition Alert;Pleasant mood;Cooperative   Treatment Provided   Treatment provided Cognitive-Linquistic   Cognitive-Linquistic Treatment   Treatment focused on Cognition   Skilled Treatment Pt retrieved her calendar that she and SLP talked about last time. However pt had one bill of 8 highlighted. "I was going to (do the others) but I let the other highlighter dry out." Pt did not write down medical appointments. "I forgot." Pt not sure if she wrote this homework down or not. SLP printed off pt's therapy and  other EPIC appointments and had her write on her calendar as well as highlight. Pt req'd cues to highlight all appointments (missed 3/7), and to write appointment times. No emergent awareness for these two errors was shown. Pt req'd SLP cues to reason what she could do to remember to transfer these appointments to a large desk calendar she has hanging on her closet door. Eventually she wrote this down on a post it due to leaving her spiral notebook on the couch at home. Husband still with concerns about pt cooking. SLP told pt not to use stove unless supervised. SLP requested OT referral from Dr. Roda Shutters.   Assessment / Recommendations / Plan   Plan Continue with current plan of care   Progression Toward Goals   Progression toward goals Not progressing toward goals (comment)  severity of deficits hindering progress          SLP Education - 11/08/15 1017    Education provided Yes   Education Details no stove use unless supervised   Person(s) Educated Patient;Spouse   Methods Explanation   Comprehension Verbalized understanding;Need further instruction          SLP Short Term Goals - 11/08/15 1141    SLP SHORT TERM GOAL #1   Title Pt will selectivley attentd to cognitive task for 8 minutes in distracting environment with less than 2 redirections   Time 1   Period Weeks   Status  Revised   SLP SHORT TERM GOAL #2   Title Pt will solve simple time, money reasoning problems with 80% accuracy and rare min A.   Time 1   Period Weeks   Status On-going   SLP SHORT TERM GOAL #3   Title Pt will verbalize 3 cognitive impairments with mod A   Time 1   Period Weeks   Status Revised   SLP SHORT TERM GOAL #4   Title Pt will utilize external aid for schedule and medication management over 3 sessions with rare min cues.   Time 2   Period Weeks   Status On-going          SLP Long Term Goals - 11/08/15 1142    SLP LONG TERM GOAL #1   Title Pt will demonstrate emergent awareness on cognitive  tasks with occasional min A   Time 5   Period Weeks   Status Revised   SLP LONG TERM GOAL #2   Title Pt will alternate attention between 2 simple cogntive linguistic tasks with 80% on each and occasional min A   Time 5   Period Weeks   Status On-going   SLP LONG TERM GOAL #3   Title Pt will solve mildly complex reasoning, organization, time and money problems with 85% accuracy and rare min A   Time 5   Period Weeks   Status On-going   SLP LONG TERM GOAL #4   Title --   Time --   Period --   Status --          Plan - 11/08/15 1139    Clinical Impression Statement 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 Believe pt would benefit from OT to habitualize compensations for cognitive deficits and some additional therapy with cognitiion for ADLs/IADLs, so SLP requested script for OT referral today. SLP told pt not to use stove unless supervised. Skillled ST reamins needed to maximize cognitve linguistic skills for home and community.   Speech Therapy Frequency 2x / week   Duration --  5weeks   Treatment/Interventions SLP instruction and feedback;Cognitive reorganization;Internal/external aids;Compensatory strategies;Patient/family education;Functional tasks   Potential to Achieve Goals Good   Potential Considerations Ability to learn/carryover information;Severity of impairments        Problem List Patient Active Problem List   Diagnosis Date Noted  . Palpitations 09/30/2015  . Benign essential HTN 09/30/2015  . Type 2 diabetes mellitus with circulatory disorder (HCC) 09/30/2015  . Obesity 09/30/2015  . Diabetes type 2, uncontrolled (HCC)   . Uncontrolled type 2 diabetes mellitus with circulatory disorder, without long-term current use of insulin (HCC)   . Essential hypertension   . HLD (hyperlipidemia)   . Cerebrovascular accident (CVA) due to thrombosis of cerebral artery (HCC)   . CVA (cerebral infarction) 08/07/2015  . DM (diabetes  mellitus), type 2, uncontrolled (HCC) 10/04/2009  . Pure hypercholesterolemia 10/04/2009    [facility] , MS, CCC-SLP  11/08/2015, 11:47 AM  [facility] [address] [place], [place], [address] Phone: [phone]   Fax:  [phone]   Name: JINNA WEINMAN MRN: [id number] Date of Birth: April 11, 1966

## 2015-11-08 NOTE — Telephone Encounter (Signed)
I left a message for Annette Munoz.  I told her that my reason for calling was to reschedule her research appointment.  I asked if she could call me back today or tomorrow at [phone].

## 2015-11-09 ENCOUNTER — Other Ambulatory Visit: Payer: Self-pay | Admitting: Neurology

## 2015-11-09 ENCOUNTER — Ambulatory Visit: Payer: Commercial Managed Care - HMO | Admitting: Speech Pathology

## 2015-11-09 DIAGNOSIS — IMO0002 Reserved for concepts with insufficient information to code with codable children: Secondary | ICD-10-CM

## 2015-11-09 DIAGNOSIS — I6330 Cerebral infarction due to thrombosis of unspecified cerebral artery: Secondary | ICD-10-CM

## 2015-11-09 DIAGNOSIS — I639 Cerebral infarction, unspecified: Secondary | ICD-10-CM | POA: Diagnosis not present

## 2015-11-09 NOTE — Therapy (Signed)
[facility] [address] [place], [place], [address] Phone: [phone]   Fax:  [phone]  Speech Language Pathology Treatment  Patient Details  Name: Annette Munoz MRN: [id number] Date of Birth: 09-Jun-1966 Referring Provider: Marvel Plan MD  Encounter Date: 11/09/2015      End of Session - 11/09/15 1207    Visit Number 7   Number of Visits 17   Date for SLP Re-Evaluation 12/02/15   SLP Start Time 0856   SLP Stop Time  0930   SLP Time Calculation (min) 34 min      Past Medical History  Diagnosis Date  . Diabetes mellitus without complication (HCC)   . Hypertension   . Stroke [facility])     Past Surgical History  Procedure Laterality Date  . Cesarean section    . Tee without cardioversion N/A 08/10/2015    Procedure: TRANSESOPHAGEAL ECHOCARDIOGRAM (TEE);  Surgeon: Chrystie Nose, MD;  Location: [facility] ENDOSCOPY;  Service: Cardiovascular;  Laterality: N/A;    There were no vitals filed for this visit.  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 No/denies               ADULT SLP TREATMENT - 11/09/15 0858    General Information   Behavior/Cognition Alert;Pleasant mood;Cooperative   Treatment Provided   Treatment provided Cognitive-Linquistic   Pain Assessment   Pain Assessment No/denies pain   Cognitive-Linquistic Treatment   Treatment focused on Cognition   Skilled Treatment Spouse bought pt large calendar for her wall, she reported that she did add all appointments/bills/ etc on her calendar at home. Her husband did not check it for accuracy.  Pt did not complete homework - tennis brackets - We completed this in therapy to  faciliate alternating attention and attention to detalil. Pt required usual mod A  to altnerate attention between the 2 pages and occasional min A to attend to details on the bracket. I re-educated pt re: her attention and memory deficits and how they affect her work and home life. Pt required usual min cues to write down errands she had to do and a list of items needed at beauty supply stores. She is to check her cabinet at work today and make a list of the hair dyes that she needs to get at [facility]. She continues to required mod cues to use notebook outside of therapy, indicating poor emergent awareness.   Assessment / Recommendations / Plan   Plan Continue with current plan of care   Progression Toward Goals   Progression toward goals Not progressing toward goals (comment)  reduced awareness affecting progress          SLP Education - 11/09/15 1204    Education provided Yes   Education Details cognitive impairments   Person(s) Educated Patient;Spouse   Methods Explanation   Comprehension Verbal cues required          SLP Short Term Goals - 11/09/15 1206    SLP SHORT TERM GOAL #1   Title Pt will selectivley attentd to cognitive task for 8 minutes in distracting environment with less than 2 redirections   Time 1   Period Weeks  Status Revised   SLP SHORT TERM GOAL #2   Title Pt will solve simple time, money reasoning problems with 80% accuracy and rare min A.   Time 1   Period Weeks   Status Achieved   SLP SHORT TERM GOAL #3   Title Pt will verbalize 3 cognitive impairments with mod A   Time 1   Period Weeks   Status Revised   SLP SHORT TERM GOAL #4   Title Pt will utilize external aid for schedule and medication management over 3 sessions with rare min cues.   Time 2   Period Weeks   Status On-going          SLP Long Term Goals - 11/09/15 1207    SLP LONG TERM GOAL #1   Title Pt will demonstrate emergent awareness on cognitive tasks with occasional min A   Time 5    Period Weeks   Status Revised   SLP LONG TERM GOAL #2   Title Pt will alternate attention between 2 simple cogntive linguistic tasks with 80% on each and occasional min A   Time 5   Period Weeks   Status On-going   SLP LONG TERM GOAL #3   Title Pt will solve mildly complex reasoning, organization, time and money problems with 85% accuracy and rare min A   Time 5   Period Weeks   Status On-going          Plan - 11/09/15 1205    Clinical Impression Statement Continue skilled ST to maximize pt's awareness of cognitive deficits, attention and memory compensations for success at work and to reduce caregiver burden   Speech Therapy Frequency 2x / week   Treatment/Interventions SLP instruction and feedback;Cognitive reorganization;Internal/external aids;Compensatory strategies;Patient/family education;Functional tasks   Potential Considerations Ability to learn/carryover information;Severity of impairments        Problem List Patient Active Problem List   Diagnosis Date Noted  . Palpitations 09/30/2015  . Benign essential HTN 09/30/2015  . Type 2 diabetes mellitus with circulatory disorder (HCC) 09/30/2015  . Obesity 09/30/2015  . Diabetes type 2, uncontrolled (HCC)   . Uncontrolled type 2 diabetes mellitus with circulatory disorder, without long-term current use of insulin (HCC)   . Essential hypertension   . HLD (hyperlipidemia)   . Cerebrovascular accident (CVA) due to thrombosis of cerebral artery (HCC)   . CVA (cerebral infarction) 08/07/2015  . DM (diabetes mellitus), type 2, uncontrolled (HCC) 10/04/2009  . Pure hypercholesterolemia 10/04/2009    [place], [name] MS, CCC-SLP 11/09/2015, 12:08 PM  [facility] [address] [place], [place], [address] Phone: [phone]   Fax:  [phone]   Name: Annette Munoz MRN: [id number] Date of Birth: 06-22-1966

## 2015-11-10 ENCOUNTER — Telehealth: Payer: Self-pay | Admitting: Neurology

## 2015-11-10 NOTE — Telephone Encounter (Signed)
I left a message for Annette Munoz.  I let him know that I was trying to reach Annette Munoz.  I asked if he could tell her to give me a call back.  I left my direct phone number where I can be reached.

## 2015-11-10 NOTE — Telephone Encounter (Signed)
I left a voicemail message for [employer].  I asked if she could call me back today with regards to rescheduling her research appointment.  I left my direct number where she can reach me.

## 2015-11-15 ENCOUNTER — Ambulatory Visit: Payer: Commercial Managed Care - HMO

## 2015-11-15 DIAGNOSIS — IMO0002 Reserved for concepts with insufficient information to code with codable children: Secondary | ICD-10-CM

## 2015-11-15 DIAGNOSIS — I639 Cerebral infarction, unspecified: Secondary | ICD-10-CM | POA: Diagnosis not present

## 2015-11-15 NOTE — Therapy (Signed)
[facility] [address] [place], [name], [address] Phone: [phone]   Fax:  [phone]  Speech Language Pathology Treatment  Patient Details  Name: Annette Munoz MRN: [id number] Date of Birth: 1966/07/25 Referring Provider: Rosalin Hawking MD  Encounter Date: 11/15/2015      End of Session - 11/15/15 1207    Visit Number 8   Number of Visits 17   Date for SLP Re-Evaluation 12/02/15   SLP Start Time 0936   SLP Stop Time  1016   SLP Time Calculation (min) 40 min   Activity Tolerance Patient tolerated treatment well      Past Medical History  Diagnosis Date  . Diabetes mellitus without complication ([place])   . Hypertension   . Stroke [facility])     Past Surgical History  Procedure Laterality Date  . Cesarean section    . Tee without cardioversion N/A 08/10/2015    Procedure: TRANSESOPHAGEAL ECHOCARDIOGRAM (TEE);  Surgeon: [name], MD;  Location: [facility] ENDOSCOPY;  Service: Cardiovascular;  Laterality: N/A;    There were no vitals filed for this visit.  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               ADULT SLP TREATMENT - 11/15/15 0940    General Information   Behavior/Cognition Alert;Pleasant mood;Cooperative   Treatment Provided   Treatment provided Cognitive-Linquistic   Pain Assessment   Pain Assessment No/denies pain   Cognitive-Linquistic Treatment   Treatment focused on Cognition   Skilled Treatment Pt brought homework out when asked by SLP. Pt did not double check responses as requested by SLP in previous session, indicating decr'd anticipatory awareness and/or decr'd attention/memory. SLP talked with pt/spouse today about how deficits affect her at home and at work. Pt became tearful indicating incr'd intellectual awareness. SLP suggesetd pt and  spouse sit down every night after dinner to plan pt's chores for next day and put them on the calendar - that was given as homework, and for pt to take a picture of calendar with chore/s on it.   Assessment / Recommendations / Plan   Plan Continue with current plan of care   Progression Toward Goals   Progression toward goals Not progressing toward goals (comment)  Awareness improving          SLP Education - 11/15/15 1206    Education provided Yes   Education Details how cognitive deficits impact home and work environments   Person(s) Educated Patient;Spouse   Methods Explanation;Demonstration   Comprehension Verbalized understanding          SLP Short Term Goals - 11/15/15 1207    SLP SHORT TERM GOAL #1   Title Pt will selectivley attentd to cognitive task for 8 minutes in distracting environment with less than 2 redirections   Status Not Met   SLP SHORT TERM GOAL #2   Title Pt will solve simple time, money reasoning problems with 80% accuracy and rare min A.   Status Achieved   SLP SHORT TERM GOAL #3   Title Pt will verbalize 3 cognitive impairments with mod A   Status Not Met   SLP SHORT TERM GOAL #4   Title Pt will utilize external aid for schedule and medication management over 3 sessions with rare min cues.   Status Partially Met  one session  SLP Long Term Goals - 11/15/15 1209    SLP LONG TERM GOAL #1   Title Pt will demonstrate emergent awareness on cognitive tasks with occasional min A   Time 4   Period Weeks   Status Revised   SLP LONG TERM GOAL #2   Title Pt will alternate attention between 2 simple cogntive linguistic tasks with 80% on each and occasional min A   Time 4   Period Weeks   Status On-going   SLP LONG TERM GOAL #3   Title Pt will solve mildly complex reasoning, organization, time and money problems with 85% accuracy and rare min A   Time 4   Period Weeks   Status On-going   SLP LONG TERM GOAL #4   Title Pt will demonstrate  emergent awareness during cognitive tasks with rare min A   Time 4   Period Weeks   Status On-going          Plan - 11/15/15 1207    Clinical Impression Statement Continue skilled ST to maximize pt's awareness of cognitive deficits, attention and memory compensations for success at work and to reduce caregiver burden.   Speech Therapy Frequency 2x / week   Duration 4 weeks   Treatment/Interventions SLP instruction and feedback;Cognitive reorganization;Internal/external aids;Compensatory strategies;Patient/family education;Functional tasks   Potential Considerations Ability to learn/carryover information;Severity of impairments        Problem List Patient Active Problem List   Diagnosis Date Noted  . Palpitations 09/30/2015  . Benign essential HTN 09/30/2015  . Type 2 diabetes mellitus with circulatory disorder ([place]) 09/30/2015  . Obesity 09/30/2015  . Diabetes type 2, uncontrolled ([place])   . Uncontrolled type 2 diabetes mellitus with circulatory disorder, without long-term current use of insulin ([place])   . Essential hypertension   . HLD (hyperlipidemia)   . Cerebrovascular accident (CVA) due to thrombosis of cerebral artery ([place])   . CVA (cerebral infarction) 08/07/2015  . DM (diabetes mellitus), type 2, uncontrolled ([place]) 10/04/2009  . Pure hypercholesterolemia 10/04/2009    [facility] , MS, [place]  11/15/2015, 12:10 PM  [facility] [facility] [address] [place], [name], [address] Phone: [phone]   Fax:  [phone]   Name: Annette Munoz MRN: [id number] Date of Birth: 03/28/1966

## 2015-11-16 ENCOUNTER — Ambulatory Visit: Payer: Commercial Managed Care - HMO

## 2015-11-16 DIAGNOSIS — I639 Cerebral infarction, unspecified: Secondary | ICD-10-CM | POA: Diagnosis not present

## 2015-11-16 DIAGNOSIS — IMO0002 Reserved for concepts with insufficient information to code with codable children: Secondary | ICD-10-CM

## 2015-11-16 NOTE — Patient Instructions (Addendum)
Use your phone to help you remember - use the "alarm" function because it has a place for a note about WHY you are reminding yourself

## 2015-11-16 NOTE — Therapy (Signed)
[facility] [address] [place], [name], [address] Phone: [phone]   Fax:  [phone]  Speech Language Pathology Treatment  Patient Details  Name: Annette Munoz MRN: [id number] Date of Birth: 09-18-1966 Referring Provider: Rosalin Hawking MD  Encounter Date: 11/16/2015      End of Session - 11/16/15 1004    Visit Number 9   Number of Visits 17   Date for SLP Re-Evaluation 12/02/15   SLP Start Time 0855   SLP Stop Time  0931  pt arrived late   SLP Time Calculation (min) 36 min   Activity Tolerance Patient tolerated treatment well      Past Medical History  Diagnosis Date  . Diabetes mellitus without complication ([place])   . Hypertension   . Stroke [facility])     Past Surgical History  Procedure Laterality Date  . Cesarean section    . Tee without cardioversion N/A 08/10/2015    Procedure: TRANSESOPHAGEAL ECHOCARDIOGRAM (TEE);  Surgeon: [name], MD;  Location: [facility] ENDOSCOPY;  Service: Cardiovascular;  Laterality: N/A;    There were no vitals filed for this visit.  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               ADULT SLP TREATMENT - 11/16/15 0857    General Information   Behavior/Cognition Alert;Pleasant mood;Cooperative   Treatment Provided   Treatment provided Cognitive-Linquistic   Cognitive-Linquistic Treatment   Treatment focused on Cognition   Skilled Treatment Pt shrugged shoulders when asked how things went with homework last night. Pt did not take picture of calendar for SLP as asked. SLP used this as chanace to incr pt's awareness of need to write things down in a place where she will see them, as she stated she did not look at the notebook at all. SLP assisted pt problem-solve about process of using "write it down" so that she can remember -  putting sticky note on front of notebook and then transfer to refrigerator. SLP provided sticky notes for pt. Pt req'd total A to put sticky note reminder on cover of binder - pt was going to put entire pad of sticky notes in her purse with reminder of homework on top. In detailed task, pt only double checked her math - did not check she wrote down items correctly. She recalled one simple task 5 minutes into the future 100% detail, SLP had to cue pt to use timer on phone for reminder. Pt spontaneously used timer for second simple task 10 minutes into future, and did not recall task to complete. SLP problem solved with pt to use "alarm" function on her iPhone.    Assessment / Recommendations / Plan   Plan Continue with current plan of care   Progression Toward Goals   Progression toward goals Progressing toward goals          SLP Education - 11/16/15 1003    Education provided Yes   Education Details alarm function on phone for reminders   Person(s) Educated Patient;Spouse   Methods Explanation;Demonstration;Verbal cues   Comprehension Verbalized understanding;Returned demonstration;Verbal cues required          SLP Short Term Goals - 11/15/15 1207    SLP SHORT TERM GOAL #1   Title Pt will selectivley attentd to cognitive task for 8 minutes in distracting environment with less than 2  redirections   Status Not Met   SLP SHORT TERM GOAL #2   Title Pt will solve simple time, money reasoning problems with 80% accuracy and rare min A.   Status Achieved   SLP SHORT TERM GOAL #3   Title Pt will verbalize 3 cognitive impairments with mod A   Status Not Met   SLP SHORT TERM GOAL #4   Title Pt will utilize external aid for schedule and medication management over 3 sessions with rare min cues.   Status Partially Met  one session          SLP Long Term Goals - 11/15/15 1209    SLP LONG TERM GOAL #1   Title Pt will demonstrate emergent awareness on cognitive tasks with occasional min A    Time 4   Period Weeks   Status Revised   SLP LONG TERM GOAL #2   Title Pt will alternate attention between 2 simple cogntive linguistic tasks with 80% on each and occasional min A   Time 4   Period Weeks   Status On-going   SLP LONG TERM GOAL #3   Title Pt will solve mildly complex reasoning, organization, time and money problems with 85% accuracy and rare min A   Time 4   Period Weeks   Status On-going   SLP LONG TERM GOAL #4   Title Pt will demonstrate emergent awareness during cognitive tasks with rare min A   Time 4   Period Weeks   Status On-going          Plan - 11/16/15 1004    Clinical Impression Statement Continue skilled ST to maximize pt's awareness of cognitive deficits, attention and memory compensations for success at work and to reduce caregiver burden.   Speech Therapy Frequency 2x / week   Duration 4 weeks   Treatment/Interventions SLP instruction and feedback;Cognitive reorganization;Internal/external aids;Compensatory strategies;Patient/family education;Functional tasks   Potential to Achieve Goals Fair   Potential Considerations Ability to learn/carryover information;Severity of impairments        Problem List Patient Active Problem List   Diagnosis Date Noted  . Palpitations 09/30/2015  . Benign essential HTN 09/30/2015  . Type 2 diabetes mellitus with circulatory disorder ([place]) 09/30/2015  . Obesity 09/30/2015  . Diabetes type 2, uncontrolled ([place])   . Uncontrolled type 2 diabetes mellitus with circulatory disorder, without long-term current use of insulin ([place])   . Essential hypertension   . HLD (hyperlipidemia)   . Cerebrovascular accident (CVA) due to thrombosis of cerebral artery ([place])   . CVA (cerebral infarction) 08/07/2015  . DM (diabetes mellitus), type 2, uncontrolled ([place]) 10/04/2009  . Pure hypercholesterolemia 10/04/2009    [facility] , MS, CCC-SLP  11/16/2015, 10:05 AM  [facility] [address] [place], [name], [address] Phone: [phone]   Fax:  [phone]   Name: Annette Munoz MRN: [id number] Date of Birth: [birth year]

## 2015-11-18 ENCOUNTER — Telehealth: Payer: Self-pay

## 2015-11-18 NOTE — Telephone Encounter (Signed)
LFt vm for patient about results of brain ultrasound.

## 2015-11-18 NOTE — Telephone Encounter (Signed)
-----   Message from Marvel Plan, MD sent at 11/16/2015 10:41 PM EST ----- Could you please let the patient know that the brain ultrasound test done recently was negative for clot detection. Please continue current treatment. Thanks.  Marvel Plan, MD PhD Stroke Neurology 11/16/2015 10:41 PM

## 2015-11-22 ENCOUNTER — Encounter: Payer: Self-pay | Admitting: Occupational Therapy

## 2015-11-22 ENCOUNTER — Ambulatory Visit: Payer: Commercial Managed Care - HMO | Attending: Internal Medicine

## 2015-11-22 ENCOUNTER — Ambulatory Visit: Payer: Commercial Managed Care - HMO | Admitting: Occupational Therapy

## 2015-11-22 DIAGNOSIS — IMO0002 Reserved for concepts with insufficient information to code with codable children: Secondary | ICD-10-CM

## 2015-11-22 DIAGNOSIS — R41841 Cognitive communication deficit: Secondary | ICD-10-CM | POA: Insufficient documentation

## 2015-11-22 DIAGNOSIS — R4189 Other symptoms and signs involving cognitive functions and awareness: Secondary | ICD-10-CM | POA: Diagnosis present

## 2015-11-22 DIAGNOSIS — I639 Cerebral infarction, unspecified: Secondary | ICD-10-CM | POA: Insufficient documentation

## 2015-11-22 NOTE — Therapy (Signed)
[facility] [address] [place], [name], [address] Phone: [phone]   Fax:  [phone]  Speech Language Pathology Treatment  Patient Details  Name: Annette Munoz MRN: [id number] Date of Birth: 06-Nov-1965 Referring Provider: Rosalin Hawking MD  Encounter Date: 11/22/2015      End of Session - 11/22/15 0929    Visit Number 10   Number of Visits 17   Date for SLP Re-Evaluation 12/02/15   SLP Start Time 0901   SLP Stop Time  0931  pt 15 minutes late   SLP Time Calculation (min) 30 min   Activity Tolerance Patient tolerated treatment well      Past Medical History  Diagnosis Date  . Diabetes mellitus without complication ([place])   . Hypertension   . Stroke [facility])     Past Surgical History  Procedure Laterality Date  . Cesarean section    . Tee without cardioversion N/A 08/10/2015    Procedure: TRANSESOPHAGEAL ECHOCARDIOGRAM (TEE);  Surgeon: [name], MD;  Location: [facility] ENDOSCOPY;  Service: Cardiovascular;  Laterality: N/A;    There were no vitals filed for this visit.  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 Appointment was at 8:45.    Patient is accompained by: --  husband               ADULT SLP TREATMENT - 11/22/15 0905    General Information   Behavior/Cognition Alert;Pleasant mood;Cooperative   Treatment Provided   Treatment provided Cognitive-Linquistic   Cognitive-Linquistic Treatment   Treatment focused on Cognition   Skilled Treatment SLP suggested pt transfer her therapy appointments on her calendar in her room, given "S" statement. SLP suggested pt's husband go behind her and check her for accuracy re: appt time/day. Pt remembered her homework (tell SLP each high temperature since last session) and completed it.  Explained/reiterated the nature of CVA and pt's brain can compensate for skills in the area/s lost by the CVA, but can't guarantee how much skill pt will recover. SLP reiterated to husband he will need to assist pt during this time. Pt has receipts for work to put in her tax folder. She req'd cues to put reminder in her phone to put receipts in the folder as she did not spontaneously think of this.    Assessment / Recommendations / Plan   Plan Continue with current plan of care   Progression Toward Goals   Progression toward goals Progressing toward goals            SLP Short Term Goals - 11/15/15 1207    SLP SHORT TERM GOAL #1   Title Pt will selectivley attentd to cognitive task for 8 minutes in distracting environment with less than 2 redirections   Status Not Met   SLP SHORT TERM GOAL #2   Title Pt will solve simple time, money reasoning problems with 80% accuracy and rare min A.   Status Achieved   SLP SHORT TERM GOAL #3   Title Pt will verbalize 3 cognitive impairments with mod A   Status Not Met   SLP SHORT TERM GOAL #4   Title Pt will utilize external aid for schedule and medication management over 3 sessions with rare min cues.   Status Partially Met  one session          SLP  Long Term Goals - 11/22/15 0931    SLP LONG TERM GOAL #1   Title Pt will demonstrate emergent awareness on cognitive tasks with occasional min A   Time 3   Period Weeks   Status Revised   SLP LONG TERM GOAL #2   Title Pt will alternate attention between 2 simple cogntive linguistic tasks with 80% on each and occasional min A   Time 3   Period Weeks   Status On-going   SLP LONG TERM GOAL #3   Title Pt will solve mildly complex reasoning, organization, time and money problems with 85% accuracy and rare min A   Time 3   Period Weeks   Status On-going   SLP LONG TERM GOAL #4   Title Pt will demonstrate emergent awareness during cognitive tasks with rare min A   Time 3   Period Weeks    Status On-going          Plan - 11/22/15 0930    Clinical Impression Statement Continue skilled ST to maximize pt's awareness of cognitive deficits, and use of attention and memory compensations for success at work and home environments, and to reduce caregiver burden.   Speech Therapy Frequency 2x / week   Duration 4 weeks  3 weeks   Treatment/Interventions SLP instruction and feedback;Cognitive reorganization;Internal/external aids;Compensatory strategies;Patient/family education;Functional tasks   Potential to Achieve Goals Fair   Potential Considerations Ability to learn/carryover information;Severity of impairments        Problem List Patient Active Problem List   Diagnosis Date Noted  . Palpitations 09/30/2015  . Benign essential HTN 09/30/2015  . Type 2 diabetes mellitus with circulatory disorder ([place]) 09/30/2015  . Obesity 09/30/2015  . Diabetes type 2, uncontrolled ([place])   . Uncontrolled type 2 diabetes mellitus with circulatory disorder, without long-term current use of insulin ([place])   . Essential hypertension   . HLD (hyperlipidemia)   . Cerebrovascular accident (CVA) due to thrombosis of cerebral artery ([place])   . CVA (cerebral infarction) 08/07/2015  . DM (diabetes mellitus), type 2, uncontrolled ([place]) 10/04/2009  . Pure hypercholesterolemia 10/04/2009    [facility] , MS, CCC-SLP  11/22/2015, 9:32 AM  [facility] [address] [place], [name], [address] Phone: [phone]   Fax:  [phone]   Name: Annette Munoz MRN: [id number] Date of Birth: [birth year]

## 2015-11-22 NOTE — Telephone Encounter (Signed)
LFt vm for patient about brain ultrasound.

## 2015-11-22 NOTE — Therapy (Signed)
[facility] [address] [place], [place], [address] Phone: [phone]   Fax:  [phone]  Occupational Therapy Evaluation  Patient Details  Name: Annette Munoz MRN: [id number] Date of Birth: Jul 23, 1966 Referring Provider: Dr. Jeoffrey Massed  Encounter Date: 11/22/2015      OT End of Session - 11/22/15 1045    Visit Number 1   Number of Visits 1   Authorization Type U[employer]   OT Start Time 1017   OT Stop Time 1035   OT Time Calculation (min) 18 min   Activity Tolerance Patient tolerated treatment well      Past Medical History  Diagnosis Date  . Diabetes mellitus without complication (HCC)   . Hypertension   . Stroke [facility])     Past Surgical History  Procedure Laterality Date  . Cesarean section    . Tee without cardioversion N/A 08/10/2015    Procedure: TRANSESOPHAGEAL ECHOCARDIOGRAM (TEE);  Surgeon: Chrystie Nose, MD;  Location: [facility] ENDOSCOPY;  Service: Cardiovascular;  Laterality: N/A;    There were no vitals filed for this visit.  Visit Diagnosis:  Impaired cognition      Subjective Assessment - 11/22/15 1021    Patient is accompained by: Family member  husband, Annette Munoz   Pertinent History see epic   Currently in Pain? No/denies           [facility] OT Assessment - 11/22/15 1022    Assessment   Diagnosis Bilateral strokes   Referring Provider Dr. Jeoffrey Massed   Onset Date 08/07/15   Assessment 08/12/2015   Prior Therapy PT, OT and ST in the hospital    Precautions   Precautions None   Restrictions   Weight Bearing Restrictions No   Balance Screen   Has the patient fallen in the past 6 months No   Home  Environment   Family/patient expects to be discharged to: Private residence   Living Arrangements Spouse/significant other  39 year old son and college age dtr   Type of Home House   Home Layout One level   Bathroom Shower/Tub Tub/Shower unit   Information systems manager   Additional  Comments Pt states no equipment   Prior Function   Level of Independence Independent   ADL   Eating/Feeding Independent   Grooming Independent   Upper Body Bathing Independent   Lower Body Bathing Independent   Upper Body Dressing Independent   Lower Body Dressing Independent   Glass blower/designer - Conservator, museum/gallery -  Tour manager Independent   IADL   Shopping Takes care of all shopping needs independently   Light Housekeeping --  totally independent and has resume all actviities   Meal Prep Plans, prepares and serves adequate meals independently   Training and development officer own vehicle   Medication Management Is responsible for taking medication in correct dosages at correct time   Development worker, community financial matters independently (budgets, writes checks, pays rent, bills goes to bank), collects and keeps track of income  with husband   Mobility   Mobility Status Independent   Written Expression   Dominant Hand Right   Vision - History   Baseline Vision Wears glasses for distance only   Vision Assessment   Comment Pt denies any visual changes   Activity Tolerance   Activity Tolerance Endurance does not limit participation in activity   Cognition   Area of Impairment Memory  Memory Decreased short-term memory   Awareness Intellectual   Attention Alternating   Executive Function Organizing   Behaviors --  pt and husband deny deficits however documented   Sensation   Light Touch Appears Intact   Hot/Cold Appears Intact   Proprioception Appears Intact   Coordination   Gross Motor Movements are Fluid and Coordinated Yes   Fine Motor Movements are Fluid and Coordinated Yes   Finger Nose Finger Test WNL's   Tone   Assessment Location Right Upper Extremity;Left Upper Extremity   ROM / Strength   AROM / PROM / Strength AROM;Strength   AROM   Overall AROM  Within functional limits for  tasks performed   Strength   Overall Strength Within functional limits for tasks performed   Hand Function   Right Hand Gross Grasp Functional   Left Hand Gross Grasp Functional   RUE Tone   RUE Tone Within Functional Limits   LUE Tone   LUE Tone Within Functional Limits     Pt and husband both deny any deficits and pt and husband both report that pt returned to work within one week of discharge and has resumed all prior responsibilities/activities within the home and community.  When confronted about why the pt is currently receiving ST both pt and husband had difficulty answering ("They say I need it.").  Neither pt nor husband feel/want to participate in OT and cognitive deficits are being addressed by ST.  Given the likelihood that pt and husband would be resistant and given that pt has resumed life roles, as well as the fact that cognitive deficits are being addressed by ST,  do not feel OT is not indicated at this time.                      OT Short Term Goals - 11/22/15 1038    OT SHORT TERM GOAL #1   Title n/a           OT Long Term Goals - 11/22/15 1038    OT LONG TERM GOAL #1   Title n/a               Plan - 11/22/15 1038    Clinical Impression Statement Pt is a 50 year old female with h/o of uncontrolled DM with circulatory disorder, HTN, HLD, obesity who was diagnosed with bilateral strokes on 08/07/2015. Pt was hospitalized until 08/12/2015 and recieved PT, OT and ST while in the hospital. Pt was discharged home with her husband and they both report she returned to work one week later. Pt intially had orders for PT, OT and ST however it appears they did not follow up to make these appointments except for ST on 10/07/2015.  Pt has had ongoing ST since that time.  Pt today presents with following deficits: impaired cognition including decreased memory, decreased awarenss and decreased executive skills. Pt and husband however both deny deficits and pt  and husband both report that pt has resumed all activites including work and driving. ST is currently addressing cognitive deficts therefore do not feel pt requires OT at this time.   Pt will benefit from skilled therapeutic intervention in order to improve on the following deficits (Retired) Decreased cognition  pt receiving ST currently   Rehab Potential --  n/a   OT Frequency --  n/a   OT Treatment/Interventions --  n/a   Plan no further OT recommended at this time.   Consulted and Agree with  Plan of Care Patient;Family member/caregiver   Family Member Consulted husband        Problem List Patient Active Problem List   Diagnosis Date Noted  . Palpitations 09/30/2015  . Benign essential HTN 09/30/2015  . Type 2 diabetes mellitus with circulatory disorder (HCC) 09/30/2015  . Obesity 09/30/2015  . Diabetes type 2, uncontrolled (HCC)   . Uncontrolled type 2 diabetes mellitus with circulatory disorder, without long-term current use of insulin (HCC)   . Essential hypertension   . HLD (hyperlipidemia)   . Cerebrovascular accident (CVA) due to thrombosis of cerebral artery (HCC)   . CVA (cerebral infarction) 08/07/2015  . DM (diabetes mellitus), type 2, uncontrolled (HCC) 10/04/2009  . Pure hypercholesterolemia 10/04/2009    Norton Pastel, OTR/L 11/22/2015, 10:46 AM  [facility] [address] [place], [place], [address] Phone: [phone]   Fax:  [phone]  Name: Annette Munoz MRN: [id number] Date of Birth: 1965/10/29

## 2015-11-23 ENCOUNTER — Ambulatory Visit: Payer: Commercial Managed Care - HMO | Admitting: Speech Pathology

## 2015-11-23 DIAGNOSIS — I639 Cerebral infarction, unspecified: Secondary | ICD-10-CM | POA: Diagnosis not present

## 2015-11-23 DIAGNOSIS — IMO0002 Reserved for concepts with insufficient information to code with codable children: Secondary | ICD-10-CM

## 2015-11-23 NOTE — Therapy (Signed)
[facility] [address] [place], [name], [address] Phone: [phone]   Fax:  [phone]  Speech Language Pathology Treatment  Patient Details  Name: Annette Munoz MRN: [id number] Date of Birth: [birth year] Referring Provider: Rosalin Hawking MD  Encounter Date: 11/23/2015      End of Session - 11/23/15 1208    Visit Number 11   Number of Visits 17   Date for SLP Re-Evaluation 12/02/15   SLP Start Time 0902   SLP Stop Time  0933   SLP Time Calculation (min) 31 min      Past Medical History  Diagnosis Date  . Diabetes mellitus without complication ([place])   . Hypertension   . Stroke [facility])     Past Surgical History  Procedure Laterality Date  . Cesarean section    . Tee without cardioversion N/A 08/10/2015    Procedure: TRANSESOPHAGEAL ECHOCARDIOGRAM (TEE);  Surgeon: [name], MD;  Location: [facility] ENDOSCOPY;  Service: Cardiovascular;  Laterality: N/A;    There were no vitals filed for this visit.  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 No/denies               ADULT SLP TREATMENT - 11/23/15 0909    General Information   Behavior/Cognition Alert;Pleasant mood;Cooperative   Treatment Provided   Treatment provided Cognitive-Linquistic   Pain Assessment   Pain Assessment No/denies pain   Cognitive-Linquistic Treatment   Treatment focused on Cognition   Skilled Treatment Facilitated alternating attention between written and auditory/verbal task with  rare min cues to re-attend to written task after verbal task. Pt consistently forgot the second half of the auditory/verbal task (coins) after usual min cueing to repeat the task 3x aloud, recall of task improved to 85% with occasional  min cues.  Educated spouse to cue pt to repeat items that she needs to remember. Spouse reporting pt is not planning dinner meal that she used to make every night. He states that dinner time comes around and Mrs. Paull does not know what to cook and does not think to look in the freezer outside.  Provided written cues for pt to meal plan for 2 weeks for homework, including listing what is in her fridge and make grocery list. Instructed spouse to help her with this as this issue is due to her CVA.    Assessment / Recommendations / Plan   Plan Continue with current plan of care   Progression Toward Goals   Progression toward goals Progressing toward goals          SLP Education - 11/23/15 1206    Education provided Yes   Education Details meal planning using notebook   Methods Explanation;Demonstration;Verbal cues   Comprehension Verbalized understanding;Verbal cues required          SLP Short Term Goals - 11/23/15 1206    SLP SHORT TERM GOAL #1   Title Pt will selectivley attentd to cognitive task for 8 minutes in distracting environment with less than 2 redirections   Status Not Met   SLP SHORT TERM GOAL #2   Title Pt will solve simple time, money reasoning problems with 80% accuracy and rare min A.   Status Achieved   SLP SHORT TERM GOAL #3  Title Pt will verbalize 3 cognitive impairments with mod A   Status Not Met   SLP SHORT TERM GOAL #4   Title Pt will utilize external aid for schedule and medication management over 3 sessions with rare min cues.   Status Partially Met  one session          SLP Long Term Goals - 11/23/15 1207    SLP LONG TERM GOAL #1   Title Pt will demonstrate emergent awareness on cognitive tasks with occasional min A   Time 3   Period Weeks   Status Revised   SLP LONG TERM GOAL #2   Title Pt will alternate attention between 2 simple cogntive linguistic tasks with 80% on each and occasional min A   Time 3   Period Weeks   Status Achieved    SLP LONG TERM GOAL #3   Title Pt will solve mildly complex reasoning, organization, time and money problems with 85% accuracy and rare min A   Time 3   Period Weeks   Status On-going   SLP LONG TERM GOAL #4   Title Pt will demonstrate emergent awareness during cognitive tasks with rare min A   Time 3   Period Weeks   Status On-going          Plan - 11/23/15 1206    Clinical Impression Statement Continue skilled ST to maximize pt's awareness of cognitive deficits, and use of attention and memory compensations for success at work and home environments, and to reduce caregiver burden.   Speech Therapy Frequency 2x / week   Duration 4 weeks   Treatment/Interventions SLP instruction and feedback;Cognitive reorganization;Internal/external aids;Compensatory strategies;Patient/family education;Functional tasks   Potential to Achieve Goals Fair   Potential Considerations Family/community support        Problem List Patient Active Problem List   Diagnosis Date Noted  . Palpitations 09/30/2015  . Benign essential HTN 09/30/2015  . Type 2 diabetes mellitus with circulatory disorder ([place]) 09/30/2015  . Obesity 09/30/2015  . Diabetes type 2, uncontrolled ([place])   . Uncontrolled type 2 diabetes mellitus with circulatory disorder, without long-term current use of insulin ([place])   . Essential hypertension   . HLD (hyperlipidemia)   . Cerebrovascular accident (CVA) due to thrombosis of cerebral artery ([place])   . CVA (cerebral infarction) 08/07/2015  . DM (diabetes mellitus), type 2, uncontrolled ([place]) 10/04/2009  . Pure hypercholesterolemia 10/04/2009    Kyrstin Campillo, [name] MS, CCC-SLP 11/23/2015, 12:09 PM  [facility] [address] [place], [name], [address] Phone: [phone]   Fax:  [phone]   Name: Annette Munoz MRN: [id number] Date of Birth: 10/01/1966

## 2015-11-23 NOTE — Telephone Encounter (Signed)
Rn call patients husband to notified him that the brain ultrasound was negative for clot detection. Pts husband is on DPR form. Rn explain pt is to continue treatment plan. Pt s husband verbalized understanding.

## 2015-11-29 ENCOUNTER — Ambulatory Visit: Payer: Commercial Managed Care - HMO

## 2015-11-29 DIAGNOSIS — I639 Cerebral infarction, unspecified: Secondary | ICD-10-CM | POA: Diagnosis not present

## 2015-11-29 DIAGNOSIS — IMO0002 Reserved for concepts with insufficient information to code with codable children: Secondary | ICD-10-CM

## 2015-11-29 NOTE — Patient Instructions (Addendum)
Plan out meals for this week then go shopping with Annette Munoz and Annette Munoz.   Put things you need to remember on your calendar.  [name] will need to help you remember to look at the calendar.

## 2015-11-29 NOTE — Therapy (Signed)
[facility] [address] [place], [name], [address] Phone: [phone]   Fax:  [phone]  Speech Language Pathology Treatment  Patient Details  Name: MEEGHAN SKIPPER MRN: [id number] Date of Birth: Jul 27, 1966 Referring Provider: Rosalin Hawking MD  Encounter Date: 11/29/2015      End of Session - 11/29/15 1027    Visit Number 12   Number of Visits 17   Date for SLP Re-Evaluation 12/02/15   SLP Start Time 0848   SLP Stop Time  0930   SLP Time Calculation (min) 42 min   Activity Tolerance Patient tolerated treatment well      Past Medical History  Diagnosis Date  . Diabetes mellitus without complication ([place])   . Hypertension   . Stroke [facility])     Past Surgical History  Procedure Laterality Date  . Cesarean section    . Tee without cardioversion N/A 08/10/2015    Procedure: TRANSESOPHAGEAL ECHOCARDIOGRAM (TEE);  Surgeon: [name], MD;  Location: [facility] ENDOSCOPY;  Service: Cardiovascular;  Laterality: N/A;    There were no vitals filed for this visit.  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   Currently in Pain? No/denies               ADULT SLP TREATMENT - 11/29/15 0854    General Information   Behavior/Cognition Alert;Pleasant mood;Cooperative   Treatment Provided   Treatment provided Cognitive-Linquistic   Cognitive-Linquistic Treatment   Treatment focused on Cognition   Skilled Treatment Pt's husband denies stating that pt had resumed all activities during OT eval. Pt told SLP that she made a shopping list and husband stated he assisted pt with list. SLP encouraged pt's husband to go shopping with pt instead of teenage son who does not have full understanding of pt deficits after CVA. Pt husband agreed - pt/husband/son all to go shopping to give son  better understanding of pt deficits following CVA. Pt did not do her meal plan for two weeks, instead wrote down what family ATE in the last two weeks. SLP told pt husband that he will need to assist pt with meal planning and shopping list until pt is independent with these tasks and he voiced understanding to SLP. SLP took step by step what pt and pt husband will need to do make meal planning work more smoothly.  SLP then educated husband and wife on how to begin to think about managing household chores. SLP again told husband he would need to assist pt in planning out how to accomplish household chores as pt unable to do so at this time.   Assessment / Recommendations / Plan   Plan Continue with current plan of care   Progression Toward Goals   Progression toward goals Progressing toward goals  pt will need to have assistance from husband for improvement          SLP Education - 11/29/15 1026    Education provided Yes   Education Details meal planning step by step, pt will need assistance with planning/remembering this   Person(s) Educated Patient;Spouse   Methods Explanation;Demonstration   Comprehension Verbalized understanding;Verbal cues required;Need further instruction          SLP Short Term Goals - 11/29/15 1133    SLP [place] #1   Title Pt will selectivley attentd to cognitive task  for 8 minutes in distracting environment with less than 2 redirections   Status Not Met   SLP SHORT TERM GOAL #2   Title Pt will solve simple time, money reasoning problems with 80% accuracy and rare min A.   Status Achieved   SLP SHORT TERM GOAL #3   Title Pt will verbalize 3 cognitive impairments with mod A   Status Not Met   SLP SHORT TERM GOAL #4   Title Pt will utilize external aid for schedule and medication management over 3 sessions with rare min cues.   Status Partially Met  one session          SLP Long Term Goals - 11/29/15 1133    SLP LONG TERM GOAL #1   Title Pt  will demonstrate emergent awareness on cognitive tasks with occasional min A   Time 2   Period Weeks   Status Revised   SLP LONG TERM GOAL #2   Title Pt will alternate attention between 2 simple cogntive linguistic tasks with 80% on each and occasional min A   Time 3   Period Weeks   Status Achieved   SLP LONG TERM GOAL #3   Title Pt will solve mildly complex reasoning, organization, time and money problems with 85% accuracy and rare min A   Time 2   Period Weeks   Status On-going   SLP LONG TERM GOAL #4   Title Pt will demonstrate emergent awareness by using memory compensation during therapy session x2 sessions   Time 2   Period Weeks   Status Revised          Plan - 11/29/15 1132    Clinical Impression Statement Continue skilled ST to maximize pt's awareness of cognitive deficits, use of attention and memory compensations for success at work and home environments, to educate pt/family, and to reduce caregiver burden.   Speech Therapy Frequency 2x / week   Duration 2 weeks   Treatment/Interventions SLP instruction and feedback;Cognitive reorganization;Internal/external aids;Compensatory strategies;Patient/family education;Functional tasks   Potential to Achieve Goals Fair   Potential Considerations Family/community support;Severity of impairments        Problem List Patient Active Problem List   Diagnosis Date Noted  . Palpitations 09/30/2015  . Benign essential HTN 09/30/2015  . Type 2 diabetes mellitus with circulatory disorder ([place]) 09/30/2015  . Obesity 09/30/2015  . Diabetes type 2, uncontrolled ([place])   . Uncontrolled type 2 diabetes mellitus with circulatory disorder, without long-term current use of insulin ([place])   . Essential hypertension   . HLD (hyperlipidemia)   . Cerebrovascular accident (CVA) due to thrombosis of cerebral artery ([place])   . CVA (cerebral infarction) 08/07/2015  . DM (diabetes mellitus), type 2, uncontrolled ([place]) 10/04/2009  . Pure  hypercholesterolemia 10/04/2009    [facility] ,MS, CCC-SLP  11/29/2015, 11:36 AM  [facility] [address] [place], [name], [address] Phone: [phone]   Fax:  [phone]   Name: SANTORIA CHASON MRN: [id number] Date of Birth: 1966-03-19

## 2015-11-30 ENCOUNTER — Ambulatory Visit: Payer: Commercial Managed Care - HMO | Admitting: Speech Pathology

## 2015-12-07 ENCOUNTER — Ambulatory Visit: Payer: Commercial Managed Care - HMO

## 2015-12-07 DIAGNOSIS — IMO0002 Reserved for concepts with insufficient information to code with codable children: Secondary | ICD-10-CM

## 2015-12-07 DIAGNOSIS — I639 Cerebral infarction, unspecified: Secondary | ICD-10-CM | POA: Diagnosis not present

## 2015-12-07 NOTE — Patient Instructions (Signed)
Put all bills in one place and write them on the calendar when due  Make a list of chores to do in the house and divide them up during the week on the calendar  Put times to play Solitaire -no more than 90 minutes per day  Talk to Dr. Roda Shutters about less desire to do things

## 2015-12-07 NOTE — Therapy (Signed)
[facility] [address] [place], [name], [address] Phone: [phone]   Fax:  [phone]  Speech Language Pathology Treatment  Patient Details  Name: AHAVA KISSOON MRN: [id number] Date of Birth: [birth year] Referring Provider: Rosalin Hawking MD  Encounter Date: 12/07/2015      End of Session - 12/07/15 0930    Visit Number 13   Number of Visits 17   Date for SLP Re-Evaluation 12/09/15   SLP Start Time 0850   SLP Stop Time  0931   SLP Time Calculation (min) 41 min   Activity Tolerance Patient tolerated treatment well      Past Medical History  Diagnosis Date  . Diabetes mellitus without complication ([place])   . Hypertension   . Stroke [facility])     Past Surgical History  Procedure Laterality Date  . Cesarean section    . Tee without cardioversion N/A 08/10/2015    Procedure: TRANSESOPHAGEAL ECHOCARDIOGRAM (TEE);  Surgeon: [name], MD;  Location: [facility] ENDOSCOPY;  Service: Cardiovascular;  Laterality: N/A;    There were no vitals filed for this visit.  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 husband states he encouraged pt to go to grocery but pt made excuses not to go until Sunday. "She just don't want to do things like she did before."   Patient is accompained by: Family member  husband               ADULT SLP TREATMENT - 12/07/15 0854    General Information   Behavior/Cognition Alert;Pleasant mood;Cooperative;Other (comment)  Repeatedly blowing bubbles with bubble gum during session   Treatment Provided   Treatment provided Cognitive-Linquistic   Cognitive-Linquistic Treatment   Treatment focused on Cognition   Skilled Treatment Pt made grocery list and went to grocery Sunday after encouragement by husband. Did not make out meal planning list like SLP  asked. Husband told pt about a meeting at school that pt did not remember after he told her one day early. SLP reiterated to pt's husband that everything needs to be written on calendar or pt will likely not recall. Husband conveys concern over pt's apathy, sits on couch and plays solitaire, surfs the web, or watches TV. Does little household chores, per husband. Pt without explanation why. SLP educated how to put chores on pt's calendar and encouraged son/husband to assist pt with chores due to reduced organization skills. Pt again told SLP that there are no concerns at work with pt.   Assessment / Recommendations / Plan   Plan Continue with current plan of care  likely 1-2 more sessions until d/c   Progression Toward Goals   Progression toward goals Not progressing toward goals (comment)  apathy apparent in today's session          SLP Education - 12/07/15 0929    Education provided Yes   Education Details organiziation of chores, use of calendar essential, CVA educaiton about emotional chnages/changes after CVA   Person(s) Educated Patient;Spouse   Methods Explanation;Demonstration;Handout   Comprehension Verbalized understanding          SLP Short Term Goals - 11/29/15 1133    SLP SHORT TERM GOAL #1   Title Pt will selectivley attentd to cognitive task for 8 minutes in distracting environment with less than 2 redirections   Status Not Met  SLP SHORT TERM GOAL #2   Title Pt will solve simple time, money reasoning problems with 80% accuracy and rare min A.   Status Achieved   SLP SHORT TERM GOAL #3   Title Pt will verbalize 3 cognitive impairments with mod A   Status Not Met   SLP SHORT TERM GOAL #4   Title Pt will utilize external aid for schedule and medication management over 3 sessions with rare min cues.   Status Partially Met  one session          SLP Long Term Goals - 12/07/15 0934    SLP LONG TERM GOAL #1   Title Pt will demonstrate emergent awareness on  cognitive tasks with occasional min A   Time 2   Period Weeks   Status Revised   SLP LONG TERM GOAL #2   Title Pt will alternate attention between 2 simple cogntive linguistic tasks with 80% on each and occasional min A   Time 2   Period Weeks   Status Deferred  to work on International aid/development worker   SLP LONG TERM GOAL #3   Title Pt will solve mildly complex reasoning, organization, time and money problems with 85% accuracy and rare min A   Time 2   Period Weeks   Status Deferred  to work on Games developer   SLP LONG TERM GOAL #4   Title Pt will demonstrate emergent awareness by using memory compensation during therapy session x2 sessions   Time 2   Period Weeks   Status Revised          Plan - 12/07/15 0932    Clinical Impression Statement Continue skilled ST to maximize pt's awareness of cognitive deficits, use of attention and memory compensations for success at work and home environments, to educate pt/family, and to reduce caregiver burden. SLP learned today pt is largely apathetic about anything but work. SLP told pt/husband to talk to referring MD about this. Pt/husband have tools necessary to improve pt's memory/organization but pt appears unwilling to use them. Suspect 1-2 more sessions until d/c.   Speech Therapy Frequency 2x / week   Duration 2 weeks   Treatment/Interventions SLP instruction and feedback;Cognitive reorganization;Internal/external aids;Compensatory strategies;Patient/family education;Functional tasks   Potential to Achieve Goals Fair   Potential Considerations Family/community support;Severity of impairments        Problem List Patient Active Problem List   Diagnosis Date Noted  . Palpitations 09/30/2015  . Benign essential HTN 09/30/2015  . Type 2 diabetes mellitus with circulatory disorder ([place]) 09/30/2015  . Obesity 09/30/2015  . Diabetes type 2, uncontrolled ([place])   . Uncontrolled type 2 diabetes mellitus with circulatory  disorder, without long-term current use of insulin ([place])   . Essential hypertension   . HLD (hyperlipidemia)   . Cerebrovascular accident (CVA) due to thrombosis of cerebral artery ([place])   . CVA (cerebral infarction) 08/07/2015  . DM (diabetes mellitus), type 2, uncontrolled ([place]) 10/04/2009  . Pure hypercholesterolemia 10/04/2009    [facility] ,MS, CCC-SLP  12/07/2015, 4:48 PM  [facility] [address] [place], [name], [address] Phone: [phone]   Fax:  [phone]   Name: NAILAH LUEPKE MRN: [id number] Date of Birth: 1966-05-12

## 2015-12-20 ENCOUNTER — Encounter: Payer: Self-pay | Admitting: Neurology

## 2015-12-20 ENCOUNTER — Ambulatory Visit: Payer: Commercial Managed Care - HMO

## 2015-12-20 ENCOUNTER — Ambulatory Visit (INDEPENDENT_AMBULATORY_CARE_PROVIDER_SITE_OTHER): Payer: Commercial Managed Care - HMO | Admitting: Neurology

## 2015-12-20 VITALS — BP 167/84 | HR 68 | Ht 62.0 in | Wt 220.2 lb

## 2015-12-20 DIAGNOSIS — I63522 Cerebral infarction due to unspecified occlusion or stenosis of left anterior cerebral artery: Secondary | ICD-10-CM

## 2015-12-20 DIAGNOSIS — E785 Hyperlipidemia, unspecified: Secondary | ICD-10-CM | POA: Diagnosis not present

## 2015-12-20 DIAGNOSIS — E1165 Type 2 diabetes mellitus with hyperglycemia: Secondary | ICD-10-CM | POA: Diagnosis not present

## 2015-12-20 DIAGNOSIS — IMO0002 Reserved for concepts with insufficient information to code with codable children: Secondary | ICD-10-CM

## 2015-12-20 DIAGNOSIS — E1159 Type 2 diabetes mellitus with other circulatory complications: Secondary | ICD-10-CM | POA: Diagnosis not present

## 2015-12-20 DIAGNOSIS — I10 Essential (primary) hypertension: Secondary | ICD-10-CM | POA: Diagnosis not present

## 2015-12-20 DIAGNOSIS — E669 Obesity, unspecified: Secondary | ICD-10-CM | POA: Diagnosis not present

## 2015-12-20 MED ORDER — ASPIRIN 325 MG PO TABS: 325.0000 mg | ORAL_TABLET | Freq: Every day | ORAL | Status: DC

## 2015-12-20 MED ORDER — LISINOPRIL 40 MG PO TABS: 40.0000 mg | ORAL_TABLET | Freq: Every day | ORAL | Status: DC

## 2015-12-20 MED ORDER — ATORVASTATIN CALCIUM 80 MG PO TABS
ORAL_TABLET | ORAL | Status: DC
Start: 2015-12-20 — End: 2017-01-11

## 2015-12-20 NOTE — Patient Instructions (Addendum)
-   continue ASA and lipitor now for stroke prevention - check BP twice a day and record and bring over to PCP - check glucose at home 4 times a day, 3 times before meals and one time before sleep. Record and bring over to PCP for medication adjustment.  - diabetic education referral.  - Follow up with your primary care physician for stroke risk factor modification. Recommend maintain blood pressure goal <130/80, diabetes with hemoglobin A1c goal below 6.5% and lipids with LDL cholesterol goal below 70 mg/dL.  - diabetic diet and regular exercise and lose weight - consider sleepy study in the future. - follow up in 3 months.

## 2015-12-21 ENCOUNTER — Ambulatory Visit: Payer: Commercial Managed Care - HMO | Attending: Internal Medicine

## 2015-12-21 DIAGNOSIS — IMO0002 Reserved for concepts with insufficient information to code with codable children: Secondary | ICD-10-CM

## 2015-12-21 DIAGNOSIS — I639 Cerebral infarction, unspecified: Secondary | ICD-10-CM | POA: Insufficient documentation

## 2015-12-21 DIAGNOSIS — R41841 Cognitive communication deficit: Secondary | ICD-10-CM | POA: Diagnosis present

## 2015-12-21 NOTE — Therapy (Signed)
[facility] [address] [place], [name], [address] Phone: [phone]   Fax:  [phone]  Speech Language Pathology Treatment  Patient Details  Name: Annette Munoz MRN: [id number] Date of Birth: [birth year] Referring Provider: Rosalin Hawking MD  Encounter Date: 12/21/2015      End of Session - 12/21/15 0934    Visit Number 14   Number of Visits 17   Date for SLP Re-Evaluation 12/09/15   SLP Start Time 0852   SLP Stop Time  0920  discharge day   SLP Time Calculation (min) 28 min   Activity Tolerance Patient tolerated treatment well      Past Medical History  Diagnosis Date  . Diabetes mellitus without complication ([place])   . Hypertension   . Stroke [facility])     Past Surgical History  Procedure Laterality Date  . Cesarean section    . Tee without cardioversion N/A 08/10/2015    Procedure: TRANSESOPHAGEAL ECHOCARDIOGRAM (TEE);  Surgeon: [name], MD;  Location: [facility] ENDOSCOPY;  Service: Cardiovascular;  Laterality: N/A;    There were no vitals filed for this visit.  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 Pt looked to husband for details of MD visit yesterday.                ADULT SLP TREATMENT - 12/21/15 0907    General Information   Behavior/Cognition Alert;Other (comment)  appears apathetic    Treatment Provided   Treatment provided Cognitive-Linquistic   Pain Assessment   Pain Assessment No/denies pain   Cognitive-Linquistic Treatment   Treatment focused on Cognition   Skilled Treatment Pt reports having difficulty with remembering to go to pharmacy to get meds. Husband reports he did not know she had meds at the pharmacy. SLP again encouraged husband to assist pt with IADLs/chores pt was independent with (medical and household) prior to CVA. SLP encouraged pt to look  at calendar each day (with husband's A, PRN) and write a sticky-note  on her refrig for tasks to do that day. SLP expressed concern for pt not getting meds she needs to prevent additional CVA, and reviewed pt's risk factors for CVA  as that number is high (incr'd blood cholesterol, HTN, and diabetes).    Assessment / Recommendations / Plan   Plan Discharge SLP treatment due to (comment)  pt (and family?) lack of cooperation/participation   Progression Toward Goals   Progression toward goals Not progressing toward goals (comment)  pt said she wouldn't have made medical mods pre-CVA          SLP Education - 12/21/15 0933    Education provided Yes   Education Details CVA risk factors   Person(s) Educated Patient;Spouse   Methods Explanation   Comprehension Verbalized understanding;Need further instruction          SLP Short Term Goals - 12/21/15 1220    SLP SHORT TERM GOAL #1   Title Pt will selectivley attentd to cognitive task for 8 minutes in distracting environment with less than 2 redirections   Status Not Met   SLP SHORT TERM GOAL #2   Title Pt will solve simple time, money reasoning problems with 80% accuracy and rare min A.   Status Achieved   SLP SHORT TERM GOAL #3   Title Pt will verbalize 3 cognitive impairments with mod A   Status  Not Met   SLP SHORT TERM GOAL #4   Title Pt will utilize external aid for schedule and medication management over 3 sessions with rare min cues.   Status Partially Met  one session          SLP Long Term Goals - 12/21/15 1220    SLP LONG TERM GOAL #1   Title Pt will demonstrate emergent awareness on cognitive tasks with occasional min A   Time 2   Period Weeks   Status Not Met   SLP LONG TERM GOAL #2   Title Pt will alternate attention between 2 simple cogntive linguistic tasks with 80% on each and occasional min A   Time 2   Period Weeks   Status Deferred  to work on International aid/development worker   SLP LONG TERM GOAL #3    Title Pt will solve mildly complex reasoning, organization, time and money problems with 85% accuracy and rare min A   Time 2   Period Weeks   Status Deferred  to work on Games developer   SLP LONG TERM GOAL #4   Title Pt will demonstrate emergent awareness by using memory compensation during therapy session x2 sessions   Time 2   Period Weeks   Status Not Met          Plan - 12/21/15 0935    Clinical Impression Statement Pt with what looks to be cont'd apathy regarding deficits and deficit areas. Reportedly continues to play her phone at home, >90 minutes per day. Pt saw MD yesterday who reportedly (via husband) told her she needed to make some changes to her lifestyle. SLP to discharge at this time due to lack of progress/cooperation from pt (family?).   Treatment/Interventions SLP instruction and feedback;Cognitive reorganization;Internal/external aids;Compensatory strategies;Patient/family education   Potential to Achieve Goals Fair   Potential Considerations Family/community support;Severity of impairments   Consulted and Agree with Plan of Care Patient;Family member/caregiver   Family Member Consulted husband     SPEECH THERAPY DISCHARGE SUMMARY  Visits from Start of Care: 14  Current functional level related to goals / functional outcomes: Pt made very little progress in therapy. She met one of 4 STGs and partially met another. She met 0/2 LTGs after two were deferred due to working on memory compensations/family assistance/self care-home management skills.  Pt appeared apathetic in last two to three sessions. She repeatedly asked husband re: recent events, such as MD recommendations to write down blood pressure readings x2/day, and to check blood sugar x4/day.  SLP is unsure at this time how much assistance husband is providing pt, despite being encouraged to provide assistance to pt with household routines such as meal prep, bill paying, household cleaning, and  grocery shopping. An OT evaluation was recommended and was ordered by referring physician. During that evaluation husband and wife told evaluating OT that pt was performing everything she was prior to CVA and thus, OT was not initiated. In the next ST session pt's husband told SLP that pt was not preparing dinner or grocery shopping like prior to CVA. SLP provided ideas to assist pt with memory deficits in bill pay, household chores, and meal prep. Pt reports she either did not remember to use compensations or did not choose to use compensations. Pt told SLP she has not remembered to go to pharmacy to purchase medicines that her daughter refilled for her.    Remaining deficits: Significant memory deficits, cognitive linguistic deficits.   Education / Equipment: Compensations for  memory deficits and cognitive-linguistic deficits.  Plan: Patient agrees to discharge.  Patient goals were not met. Patient is being discharged due to lack of progress.  ?????        Problem List Patient Active Problem List   Diagnosis Date Noted  . Palpitations 09/30/2015  . Benign essential HTN 09/30/2015  . Type 2 diabetes mellitus with circulatory disorder ([place]) 09/30/2015  . Obesity 09/30/2015  . Diabetes type 2, uncontrolled ([place])   . Uncontrolled type 2 diabetes mellitus with circulatory disorder, without long-term current use of insulin ([place])   . Essential hypertension   . HLD (hyperlipidemia)   . Cerebrovascular accident (CVA) due to thrombosis of cerebral artery ([place])   . CVA (cerebral infarction) 08/07/2015  . DM (diabetes mellitus), type 2, uncontrolled ([place]) 10/04/2009  . Pure hypercholesterolemia 10/04/2009    [facility] ,[place], CCC-SLP  12/21/2015, 12:21 PM  [facility] [address] [place], [name], [address] Phone: [phone]   Fax:  [phone]   Name: Annette Munoz MRN: [id number] Date of Birth: Apr 17, 1966

## 2015-12-21 NOTE — Progress Notes (Signed)
STROKE NEUROLOGY FOLLOW UP NOTE  NAME: Annette SanerBetty H Hua DOB: 11/22/1965  REASON FOR VISIT: stroke follow up HISTORY FROM: pt and chart  Today we had the pleasure of seeing Annette Munoz in follow-up at our [facility]. Pt was accompanied by husband.   History Summary Ms. Annette SanerBetty H Plamondon is a 50 y.o. female with history of DM, HTN, obesity, HLD was admitted on 08/07/15 for AMS and amnesia. 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 MRA and CTA showed left ACA, right ACA, left M1, M2, and b/l PCA mild to moderate stenosis. Had LP and negative for vasculitis. CUS and TTE as well as TEE were unremarkable. Hypercoagulable and autoimmue work up negative. LDL 138 and A1C 7.2. Her symptoms resolved and she was discharged with ASA and lipitor.  09/30/15 follow up - the patient has been doing well. No recurrent stroke like symptoms. However, she did not check BP at home and her glucometer does not work. BP today 185/82. As per husband, she still has short term memory deficit. Pt not sure if she is on lisinopril which is on her medication list.  Interval History During the interval time, pt continued to have memory issues, not remember things with behavior changes since stroke. Pt seems more apathic, not compliant with medication, not refill her medication, not check BP or glucose at home and lack of activity at home. Today BP 178/86. 30 day cardiac event monitoring negative for afib. TCD MES suboptimal but negative for microemboli.   REVIEW OF SYSTEMS: Full 14 system review of systems performed and notable only for those listed below and in HPI above, all others are negative:  Constitutional:   Cardiovascular:  Ear/Nose/Throat:   Skin:  Eyes:   Respiratory:   Gastroitestinal:   Genitourinary:  Hematology/Lymphatic:   Endocrine:  Musculoskeletal:   Allergy/Immunology:   Neurological:   Psychiatric:  Sleep:   The following  represents the patient's updated allergies and side effects list: No Known Allergies  The neurologically relevant items on the patient's problem list were reviewed on today's visit.  Neurologic Examination  A problem focused neurological exam (12 or more points of the single system neurologic examination, vital signs counts as 1 point, cranial nerves count for 8 points) was performed.  Blood pressure 167/84, pulse 68, height 5\' 2"  (1.575 m), weight 220 lb 3.2 oz (99.882 kg).  General - obese, well developed, in no apparent distress.  Ophthalmologic - Fundi not visualized due to eye movement.  Cardiovascular - Regular rate and rhythm.  Mental Status -  Level of arousal and orientation to time, place, and person were intact. Language including expression, naming, repetition, comprehension was assessed and found intact. Fund of Knowledge was assessed and was intact.  Cranial Nerves II - XII - II - Visual field intact OU. III, IV, VI - Extraocular movements intact. V - Facial sensation intact bilaterally. VII - Facial movement intact bilaterally. VIII - Hearing & vestibular intact bilaterally. X - Palate elevates symmetrically. XI - Chin turning & shoulder shrug intact bilaterally. XII - Tongue protrusion intact.  Motor Strength - The patient's strength was normal in all extremities and pronator drift was absent.  Bulk was normal and fasciculations were absent.   Motor Tone - Muscle tone was assessed at the neck and appendages and was normal.  Reflexes - The patient's reflexes were 1+ in all extremities and she had no pathological reflexes.  Sensory - Light touch,  temperature/pinprick, vibration and proprioception, and Romberg testing were assessed and were normal.    Coordination - The patient had normal movements in the hands and feet with no ataxia or dysmetria.  Tremor was absent.  Gait and Station - The patient's transfers, posture, gait, station, and turns were observed as  normal.  Data reviewed: I personally reviewed the images and agree with the radiology interpretations.  Ct Head Wo Contrast 08/07/2015  Focal low density in the left frontal lobe white matter which may be in the corpus callosum. Acute infarct is not excluded.   Mr [name] Head/brain Wo Cm 08/07/2015  1. Multiple small, acute left ACA territory infarcts involving the corpus callosum, cingulate gyrus, and superior left frontal lobe.  2. Small acute infarct involving the anteroinferior right thalamus/cerebral peduncle.  3. Moderately to severely motion degraded head MRA without evidence of large vessel occlusion or flow limiting proximal stenosis. Suggestion of A2 ACA segmental narrowing.   CTA head  08/10/2015 1. Infarcts involving the right hypothalamus, anterior genu of corpus callosum on the left, left distal ACA distribution. 2. A high-grade stenosis in proximal right A2 segment and more distal left A3 segment, within the left pericallosal artery. 3. Diffuse attenuation of distal ACA branch vessels, worse on the left. 4. Moderate proximal more mild distal left M1 segment stenoses. 5. Moderate proximal left M2 stenoses beyond the bifurcation. 6. Mild attenuation of distal MCA branch vessels bilaterally. 7. Moderate proximal PCA stenoses bilaterally with significant attenuation of distal small vessels.  Carotid Doppler 1-39% ICA plaquing. Vertebral artery flow is antegrade.  2D Echocardiogram  - Left ventricle: The cavity size was normal. There was mildconcentric hypertrophy. Systolic function was normal. Theestimated ejection fraction was in the range of 55% to 60%. Wallmotion was normal; there were no regional wall motionabnormalities. - Left atrium: The atrium was mildly dilated. - Atrial septum: No defect or patent foramen ovale was identified.  LE venous dopplers negative for DVT  TEE  1. No LAA thrombus 2. Negative for PFO 3. No significant valvular  disease 4. LVEF 55-60%  30 day cardiac monitoring - negative for afib  TCD MES - suboptimal study, no MES seen.   CSF Component     Latest Ref Rng 08/11/2015         2:56 PM  Tube #      1  Color, CSF     COLORLESS COLORLESS  Appearance, CSF     CLEAR CLEAR  Supernatant      NOT INDICATED  RBC Count, CSF     0 /cu mm 48 (H)  WBC, CSF     0 - 5 /cu mm 1  Other Cells, CSF      TOO FEW TO COUNT, SMEAR AVAILABLE FOR REVIEW  Specimen Description        Special Requests        Gram Stain        Culture        Report Status        Fungal Smear        Crypto Ag     NEGATIVE NEGATIVE  Cryptococcal Ag Titer     NOT INDICATED NOT INDICATED  Glucose, CSF     40 - 70 mg/dL 87 (H)  Total  Protein, CSF     15 - 45 mg/dL 21  VDRL Quant, CSF     Non Rea:<1:1 Non Reactive  CSF Oligoclonal Bands      Comment  Component     Latest Ref Rng 08/11/2015         2:57 PM  Tube #        Color, CSF     COLORLESS   Appearance, CSF     CLEAR   Supernatant        RBC Count, CSF     0 /cu mm   WBC, CSF     0 - 5 /cu mm   Other Cells, CSF        Specimen Description      CSF  Special Requests      NONE  Gram Stain      WBC PRESENT, PREDOMINANTLY MONONUCLEAR . . .  Culture      NO GROWTH 3 DAYS  Report Status      08/14/2015 FINAL  Fungal Smear        Crypto Ag     NEGATIVE   Cryptococcal Ag Titer     NOT INDICATED   Glucose, CSF     40 - 70 mg/dL   Total  Protein, CSF     15 - 45 mg/dL   VDRL Quant, CSF     Non Rea:<1:1   CSF Oligoclonal Bands         Component     Latest Ref Rng 08/11/2015         2:58 PM  Tube #        Color, CSF     COLORLESS   Appearance, CSF     CLEAR   Supernatant        RBC Count, CSF     0 /cu mm   WBC, CSF     0 - 5 /cu mm   Other Cells, CSF        Specimen Description      CSF  Special Requests      NONE  Gram Stain        Culture      No Fungi Isolated in 4 Weeks . . .  Report Status      09/08/2015  FINAL  Fungal Smear      NO YEAST OR FUNGAL ELEMENTS SEEN . . .  Crypto Ag     NEGATIVE   Cryptococcal Ag Titer     NOT INDICATED   Glucose, CSF     40 - 70 mg/dL   Total  Protein, CSF     15 - 45 mg/dL   VDRL Quant, CSF     Non Rea:<1:1   CSF Oligoclonal Bands         Component     Latest Ref Rng 08/08/2015 08/10/2015 08/11/2015  Cholesterol     0 - 200 mg/dL 161 (H)    Triglycerides     <150 mg/dL 096    HDL Cholesterol     >40 mg/dL 43    Total CHOL/HDL Ratio      4.8    VLDL     0 - 40 mg/dL 25    LDL (calc)     0 - 99 mg/dL 045 (H)    PTT Lupus Anticoagulant     0.0 - 40.6 sec  35.4   DRVVT     0.0 - 44.0 sec  34.0   Lupus Anticoag Interp       Comment:   Anticardiolipin IgG     0 - 14 GPL U/mL  <9   Anticardiolipin IgM  0 - 12 MPL U/mL  <9   Anticardiolipin IgA     0 - 11 APL U/mL  <9   Beta-2 Glyco I IgG     0 - 20 GPI IgG units  <9   Beta-2-Glycoprotein I IgM     0 - 32 GPI IgM units  <9   Beta-2-Glycoprotein I IgA     0 - 25 GPI IgA units  <9   C-ANCA     Neg:<1:20 titer  <1:20   P-ANCA     Neg:<1:20 titer  <1:20   Atypical P-ANCA titer     Neg:<1:20 titer  <1:20   Hemoglobin A1C     4.8 - 5.6 % 7.2 (H)    Mean Plasma Glucose      160    Recommendations-PTGENE:       Comment   Additional Information       Comment   Source Varicella-Zoster, PCR        BLOOD  Varicella-Zoster, PCR     Negative   Negative  ANA Ab, IFA       Negative   C3 Complement     82 - 167 mg/dL  161   Complement C4, Body Fluid     14 - 44 mg/dL  31   Compl, Total (WR60)     42 - 60 U/mL  > 60   HIV     Non Reactive  Non Reactive   Antithrombin Activity     75 - 120 %  103   Protein C Activity     73 - 180 %  152   Protein C, Total     60 - 150 %  131   Protein S Activity     63 - 140 %  85   Protein S Ag, Total     60 - 150 %  110   Homocysteine     0.0 - 15.0 umol/L  7.4   RPR     Non Reactive  Non Reactive   Sed Rate     0 - 22 mm/hr  46 (H)    ds DNA Ab     0 - 9 IU/mL  <1   SSA (Ro) (ENA) Antibody, IgG     0.0 - 0.9 AI  <0.2   SSB (La) (ENA) Antibody, IgG     0.0 - 0.9 AI  <0.2   Varicella     Immune >165 index  2055     Assessment: As you may recall, she is a 50 y.o. African American female with PMH of DM, HTN, obesity, HLD was admitted on 08/07/15 for AMS and amnesia. 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 MRA and CTA showed left ACA, right ACA, left M1, M2, and b/l PCA mild to moderate stenosis. Had LP and negative for vasculitis. CUS and TTE as well as TEE were unremarkable. Hypercoagulable and autoimmue work up negative. LDL 138 and A1C 7.2. Her symptoms resolved and she was discharged with ASA and lipitor. During the interval time, no recurrent stroke like symptoms. However, BP and glucose not in control. 30 day cardiac monitoring and TCD MES all negative. Pt declined RESPECT ESUS.   Plan:  - continue ASA and lipitor now for stroke prevention - check BP twice a day for at least 2 weeks and record and bring over to PCP - check glucose at home 4 times a day, 3 times  before meals and one time before sleep. Record and bring over to PCP for medication adjustment.  - diabetic education referral.  - Follow up with your primary care physician for stroke risk factor modification. Recommend maintain blood pressure goal <130/80, diabetes with hemoglobin A1c goal below 6.5% and lipids with LDL cholesterol goal below 70 mg/dL.  - diabetic diet and regular exercise and lose weight - family supervision with medication taking and home exercise. - consider sleepy study in the future. - follow up in 3 months.  I spent more than 25 minutes of face to face time with the patient. Greater than 50% of time was spent in counseling and coordination of care. We have discussed about further stroke work up and compliance with medication   Orders Placed This Encounter  Procedures  .  Ambulatory referral to Nutrition and Diabetic Education    Referral Priority:  Routine    Referral Type:  Consultation    Referral Reason:  Specialty Services Required    Number of Visits Requested:  1    Meds ordered this encounter  Medications  . atorvastatin (LIPITOR) 80 MG tablet    Sig: TAKE 1 TABLET (80 MG TOTAL) BY MOUTH DAILY AT 6 PM.    Dispense:  90 tablet    Refill:  3  . aspirin 325 MG tablet    Sig: Take 1 tablet (325 mg total) by mouth daily.    Dispense:  90 tablet    Refill:  3  . lisinopril (PRINIVIL,ZESTRIL) 40 MG tablet    Sig: Take 1 tablet (40 mg total) by mouth daily.    Dispense:  90 tablet    Refill:  3    Patient Instructions  - continue ASA and lipitor now for stroke prevention - check BP twice a day and record and bring over to PCP - check glucose at home 4 times a day, 3 times before meals and one time before sleep. Record and bring over to PCP for medication adjustment.  - diabetic education referral.  - Follow up with your primary care physician for stroke risk factor modification. Recommend maintain blood pressure goal <130/80, diabetes with hemoglobin A1c goal below 6.5% and lipids with LDL cholesterol goal below 70 mg/dL.  - diabetic diet and regular exercise and lose weight - consider sleepy study in the future. - follow up in 3 months.    Marvel Plan, MD PhD [facility] [address] [place], [place] [address] [phone]

## 2015-12-21 NOTE — Patient Instructions (Signed)
  Get to the pharmacy and get your meds.  Put a sticky note on the refrig each day that tells you what you have to do that day (get these things from the calendar).  You will need to take your meds or you will likely have another stroke sometime in the future.

## 2015-12-23 ENCOUNTER — Other Ambulatory Visit: Payer: Self-pay | Admitting: Physician Assistant

## 2016-03-27 ENCOUNTER — Encounter: Payer: Self-pay | Admitting: Neurology

## 2016-03-27 ENCOUNTER — Ambulatory Visit (INDEPENDENT_AMBULATORY_CARE_PROVIDER_SITE_OTHER): Payer: Commercial Managed Care - HMO | Admitting: Neurology

## 2016-03-27 VITALS — BP 165/81 | HR 67 | Ht 62.0 in | Wt 228.8 lb

## 2016-03-27 DIAGNOSIS — I10 Essential (primary) hypertension: Secondary | ICD-10-CM

## 2016-03-27 DIAGNOSIS — IMO0002 Reserved for concepts with insufficient information to code with codable children: Secondary | ICD-10-CM

## 2016-03-27 DIAGNOSIS — E669 Obesity, unspecified: Secondary | ICD-10-CM

## 2016-03-27 DIAGNOSIS — E1159 Type 2 diabetes mellitus with other circulatory complications: Secondary | ICD-10-CM | POA: Diagnosis not present

## 2016-03-27 DIAGNOSIS — I6330 Cerebral infarction due to thrombosis of unspecified cerebral artery: Secondary | ICD-10-CM | POA: Diagnosis not present

## 2016-03-27 DIAGNOSIS — E785 Hyperlipidemia, unspecified: Secondary | ICD-10-CM

## 2016-03-27 DIAGNOSIS — E1165 Type 2 diabetes mellitus with hyperglycemia: Secondary | ICD-10-CM | POA: Diagnosis not present

## 2016-03-27 NOTE — Patient Instructions (Addendum)
-   continue ASA and lipitor now for stroke prevention - check BP twice a day for at least 2 weeks and record and bring over to PCP. BP goal 130/80 - check glucose at home 4 times a day, 3 times before meals and one time before sleep. Record and bring over to PCP for medication adjustment. Glucose goal 80-120 - diabetic education referral again.  - Follow up with your primary care physician for stroke risk factor modification. Recommend maintain blood pressure goal. <130/80, diabetes with hemoglobin A1c goal below 6.5% and lipids with LDL cholesterol goal below 70 mg/dL.  - diabetic diet and regular exercise and lose weight - family supervision with medication taking and home exercise. - consider sleepy study in the future. - follow up in 3 months.

## 2016-03-27 NOTE — Progress Notes (Signed)
STROKE NEUROLOGY FOLLOW UP NOTE  NAME: Annette Munoz DOB: 01/13/1966  REASON FOR VISIT: stroke follow up HISTORY FROM: pt and chart  Today we had the pleasure of seeing Annette Munoz in follow-up at our [facility]rology [facility]. Pt was accompanied by husband and daughter.   History Summary [name] is a 50 y.o. female with history of DM, HTN, obesity, HLD was admitted on 08/07/15 for AMS and amnesia. 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 MRA and CTA showed left ACA, right ACA, left M1, M2, and b/l PCA mild to moderate stenosis. Had LP and negative for vasculitis. CUS and TTE as well as TEE were unremarkable. Hypercoagulable and autoimmue work up negative. LDL 138 and A1C 7.2. Her symptoms resolved and she was discharged with ASA and lipitor.  09/30/15 follow up - the patient has been doing well. No recurrent stroke like symptoms. However, she did not check BP at home and her glucometer does not work. BP today 185/82. As per husband, she still has short term memory deficit. Pt not sure if she is on lisinopril which is on her medication list.  Follow up 12/20/15 - pt continued to have memory issues, not remember things with behavior changes since stroke. Pt seems more apathic, not compliant with medication, not refill her medication, not check BP or glucose at home and lack of activity at home. Today BP 178/86. 30 day cardiac event monitoring negative for afib. TCD MES suboptimal but negative for microemboli.   Interval History During the interval time, pt continued to be noncompliance. She did not take BP at home, nor checking glucose. Her medication ran out and has not picked up at pharmacy yet. She did not call back for diabetic education calls. She did not do much healthy diet and home exercise yet. She has not followed up with PCP. Her BP today 165/81.   REVIEW OF SYSTEMS: Full 14 system review of systems performed and  notable only for those listed below and in HPI above, all others are negative:  Constitutional:   Cardiovascular:  Ear/Nose/Throat:   Skin:  Eyes:   Respiratory:   Gastroitestinal:   Genitourinary:  Hematology/Lymphatic:   Endocrine:  Musculoskeletal:   Allergy/Immunology:   Neurological:   Psychiatric:  Sleep:   The following represents the patient's updated allergies and side effects list: No Known Allergies  The neurologically relevant items on the patient's problem list were reviewed on today's visit.  Neurologic Examination  A problem focused neurological exam (12 or more points of the single system neurologic examination, vital signs counts as 1 point, cranial nerves count for 8 points) was performed.  Blood pressure 165/81, pulse 67, height 5\' 2"  (1.575 m), weight 228 lb 12.8 oz (103.783 kg).  General - obese, well developed, in no apparent distress.  Ophthalmologic - Fundi not visualized due to eye movement.  Cardiovascular - Regular rate and rhythm.  Mental Status -  Level of arousal and orientation to time, place, and person were intact. Language including expression, naming, repetition, comprehension was assessed and found intact. Fund of Knowledge was assessed and was intact.  Cranial Nerves II - XII - II - Visual field intact OU. III, IV, VI - Extraocular movements intact. V - Facial sensation intact bilaterally. VII - Facial movement intact bilaterally. VIII - Hearing & vestibular intact bilaterally. X - Palate elevates symmetrically. XI - Chin turning & shoulder shrug intact bilaterally. XII - Tongue  protrusion intact.  Motor Strength - The patient's strength was normal in all extremities and pronator drift was absent.  Bulk was normal and fasciculations were absent.   Motor Tone - Muscle tone was assessed at the neck and appendages and was normal.  Reflexes - The patient's reflexes were 1+ in all extremities and she had no pathological  reflexes.  Sensory - Light touch, temperature/pinprick, vibration and proprioception, and Romberg testing were assessed and were normal.    Coordination - The patient had normal movements in the hands and feet with no ataxia or dysmetria.  Tremor was absent.  Gait and Station - The patient's transfers, posture, gait, station, and turns were observed as normal.  Data reviewed: I personally reviewed the images and agree with the radiology interpretations.  Ct Head Wo Contrast 08/07/2015  Focal low density in the left frontal lobe white matter which may be in the corpus callosum. Acute infarct is not excluded.   Mr [name] Head/brain Wo Cm 08/07/2015  1. Multiple small, acute left ACA territory infarcts involving the corpus callosum, cingulate gyrus, and superior left frontal lobe.  2. Small acute infarct involving the anteroinferior right thalamus/cerebral peduncle.  3. Moderately to severely motion degraded head MRA without evidence of large vessel occlusion or flow limiting proximal stenosis. Suggestion of A2 ACA segmental narrowing.   CTA head  08/10/2015 1. Infarcts involving the right hypothalamus, anterior genu of corpus callosum on the left, left distal ACA distribution. 2. A high-grade stenosis in proximal right A2 segment and more distal left A3 segment, within the left pericallosal artery. 3. Diffuse attenuation of distal ACA branch vessels, worse on the left. 4. Moderate proximal more mild distal left M1 segment stenoses. 5. Moderate proximal left M2 stenoses beyond the bifurcation. 6. Mild attenuation of distal MCA branch vessels bilaterally. 7. Moderate proximal PCA stenoses bilaterally with significant attenuation of distal small vessels.  Carotid Doppler 1-39% ICA plaquing. Vertebral artery flow is antegrade.  2D Echocardiogram  - Left ventricle: The cavity size was normal. There was mildconcentric hypertrophy. Systolic function was normal. Theestimated ejection  fraction was in the range of 55% to 60%. Wallmotion was normal; there were no regional wall motionabnormalities. - Left atrium: The atrium was mildly dilated. - Atrial septum: No defect or patent foramen ovale was identified.  LE venous dopplers negative for DVT  TEE  1. No LAA thrombus 2. Negative for PFO 3. No significant valvular disease 4. LVEF 55-60%  30 day cardiac monitoring - negative for afib  TCD MES - suboptimal study, no MES seen.   CSF Component     Latest Ref Rng 08/11/2015         2:56 PM  Tube #      1  Color, CSF     COLORLESS COLORLESS  Appearance, CSF     CLEAR CLEAR  Supernatant      NOT INDICATED  RBC Count, CSF     0 /cu mm 48 (H)  WBC, CSF     0 - 5 /cu mm 1  Other Cells, CSF      TOO FEW TO COUNT, SMEAR AVAILABLE FOR REVIEW  Specimen Description        Special Requests        Gram Stain        Culture        Report Status        Fungal Smear        Crypto Ag  NEGATIVE NEGATIVE  Cryptococcal Ag Titer     NOT INDICATED NOT INDICATED  Glucose, CSF     40 - 70 mg/dL 87 (H)  Total  Protein, CSF     15 - 45 mg/dL 21  VDRL Quant, CSF     Non Rea:<1:1 Non Reactive  CSF Oligoclonal Bands      Comment   Component     Latest Ref Rng 08/11/2015         2:57 PM  Tube #        Color, CSF     COLORLESS   Appearance, CSF     CLEAR   Supernatant        RBC Count, CSF     0 /cu mm   WBC, CSF     0 - 5 /cu mm   Other Cells, CSF        Specimen Description      CSF  Special Requests      NONE  Gram Stain      WBC PRESENT, PREDOMINANTLY MONONUCLEAR . . .  Culture      NO GROWTH 3 DAYS  Report Status      08/14/2015 FINAL  Fungal Smear        Crypto Ag     NEGATIVE   Cryptococcal Ag Titer     NOT INDICATED   Glucose, CSF     40 - 70 mg/dL   Total  Protein, CSF     15 - 45 mg/dL   VDRL Quant, CSF     Non Rea:<1:1   CSF Oligoclonal Bands         Component     Latest Ref Rng 08/11/2015         2:58 PM   Tube #        Color, CSF     COLORLESS   Appearance, CSF     CLEAR   Supernatant        RBC Count, CSF     0 /cu mm   WBC, CSF     0 - 5 /cu mm   Other Cells, CSF        Specimen Description      CSF  Special Requests      NONE  Gram Stain        Culture      No Fungi Isolated in 4 Weeks . . .  Report Status      09/08/2015 FINAL  Fungal Smear      NO YEAST OR FUNGAL ELEMENTS SEEN . . .  Crypto Ag     NEGATIVE   Cryptococcal Ag Titer     NOT INDICATED   Glucose, CSF     40 - 70 mg/dL   Total  Protein, CSF     15 - 45 mg/dL   VDRL Quant, CSF     Non Rea:<1:1   CSF Oligoclonal Bands         Component     Latest Ref Rng 08/08/2015 08/10/2015 08/11/2015  Cholesterol     0 - 200 mg/dL 161 (H)    Triglycerides     <150 mg/dL 096    HDL Cholesterol     >40 mg/dL 43    Total CHOL/HDL Ratio      4.8    VLDL     0 - 40 mg/dL 25    LDL (calc)     0 - 99 mg/dL 045 (  H)    PTT Lupus Anticoagulant     0.0 - 40.6 sec  35.4   DRVVT     0.0 - 44.0 sec  34.0   Lupus Anticoag Interp       Comment:   Anticardiolipin IgG     0 - 14 GPL U/mL  <9   Anticardiolipin IgM     0 - 12 MPL U/mL  <9   Anticardiolipin IgA     0 - 11 APL U/mL  <9   Beta-2 Glyco I IgG     0 - 20 GPI IgG units  <9   Beta-2-Glycoprotein I IgM     0 - 32 GPI IgM units  <9   Beta-2-Glycoprotein I IgA     0 - 25 GPI IgA units  <9   C-ANCA     Neg:<1:20 titer  <1:20   P-ANCA     Neg:<1:20 titer  <1:20   Atypical P-ANCA titer     Neg:<1:20 titer  <1:20   Hemoglobin A1C     4.8 - 5.6 % 7.2 (H)    Mean Plasma Glucose      160    Recommendations-PTGENE:       Comment   Additional Information       Comment   Source Varicella-Zoster, PCR        BLOOD  Varicella-Zoster, PCR     Negative   Negative  ANA Ab, IFA       Negative   C3 Complement     82 - 167 mg/dL  161   Complement C4, Body Fluid     14 - 44 mg/dL  31   Compl, Total (WR60)     42 - 60 U/mL  > 60   HIV     Non Reactive   Non Reactive   Antithrombin Activity     75 - 120 %  103   Protein C Activity     73 - 180 %  152   Protein C, Total     60 - 150 %  131   Protein S Activity     63 - 140 %  85   Protein S Ag, Total     60 - 150 %  110   Homocysteine     0.0 - 15.0 umol/L  7.4   RPR     Non Reactive  Non Reactive   Sed Rate     0 - 22 mm/hr  46 (H)   ds DNA Ab     0 - 9 IU/mL  <1   SSA (Ro) (ENA) Antibody, IgG     0.0 - 0.9 AI  <0.2   SSB (La) (ENA) Antibody, IgG     0.0 - 0.9 AI  <0.2   Varicella     Immune >165 index  2055     Assessment: As you may recall, she is a 50 y.o. African American female with PMH of DM, HTN, obesity, HLD was admitted on 08/07/15 for AMS and amnesia. 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 MRA and CTA showed left ACA, right ACA, left M1, M2, and b/l PCA mild to moderate stenosis. Had LP and negative for vasculitis. CUS and TTE as well as TEE were unremarkable. Hypercoagulable and autoimmue work up negative. LDL 138 and A1C 7.2. Her symptoms resolved and she was discharged with ASA and lipitor. During the interval time, no recurrent  stroke like symptoms. However, BP and glucose not in control. 30 day cardiac monitoring and TCD MES all negative. Pt declined RESPECT ESUS. She continues to be noncompliance with medication, BP and glucose checking.   Plan:  - continue ASA and lipitor now for stroke prevention - check BP twice a day for at least 2 weeks and record and bring over to PCP.  - check glucose at home 4 times a day, 3 times before meals and one time before sleep. Record and bring over to PCP for medication adjustment.  - diabetic education referral again.  - Follow up with your primary care physician for stroke risk factor modification. Recommend maintain blood pressure goal. <130/80, diabetes with hemoglobin A1c goal below 6.5% and lipids with LDL cholesterol goal below 70 mg/dL.  - diabetic diet and  regular exercise and lose weight - family supervision with medication taking and home exercise. - consider sleepy study in the future. - follow up in 3 months.  I spent more than 25 minutes of face to face time with the patient. Greater than 50% of time was spent in counseling and coordination of care. We have discussed about BP and glucose monitoring, and compliance with medication   Orders Placed This Encounter  Procedures  . Ambulatory referral to Nutrition and Diabetic Education    Referral Priority:  Routine    Referral Type:  Consultation    Referral Reason:  Specialty Services Required    Number of Visits Requested:  1    Meds ordered this encounter  Medications  . cyanocobalamin 1000 MCG tablet    Sig: Take 1,000 mcg by mouth 2 (two) times daily.    Patient Instructions  - continue ASA and lipitor now for stroke prevention - check BP twice a day for at least 2 weeks and record and bring over to PCP. BP goal 130/80 - check glucose at home 4 times a day, 3 times before meals and one time before sleep. Record and bring over to PCP for medication adjustment. Glucose goal 80-120 - diabetic education referral again.  - Follow up with your primary care physician for stroke risk factor modification. Recommend maintain blood pressure goal. <130/80, diabetes with hemoglobin A1c goal below 6.5% and lipids with LDL cholesterol goal below 70 mg/dL.  - diabetic diet and regular exercise and lose weight - family supervision with medication taking and home exercise. - consider sleepy study in the future. - follow up in 3 months.    Marvel Plan, MD PhD [facility] [address] [place], [place] [address] [phone]

## 2016-04-03 ENCOUNTER — Telehealth: Payer: Self-pay | Admitting: Neurology

## 2016-04-03 NOTE — Telephone Encounter (Signed)
Patient is calling and states she needs a refill for Rx atorvastin(LIPITOR) 80 mg sent to [facility], [address]  Thanks!

## 2016-04-03 NOTE — Telephone Encounter (Signed)
Rn call patients daughter back to state her mom has 2 refills left on lipitor. Rn stated if he mom is requesting a early refill most insurances will not cover. Pts daughter will check with her mom.

## 2016-05-01 ENCOUNTER — Encounter: Payer: Commercial Managed Care - HMO | Attending: Neurology | Admitting: Skilled Nursing Facility1

## 2016-05-01 ENCOUNTER — Encounter: Payer: Self-pay | Admitting: Skilled Nursing Facility1

## 2016-05-01 VITALS — Ht 62.0 in

## 2016-05-01 DIAGNOSIS — E1159 Type 2 diabetes mellitus with other circulatory complications: Secondary | ICD-10-CM | POA: Insufficient documentation

## 2016-05-01 DIAGNOSIS — Z713 Dietary counseling and surveillance: Secondary | ICD-10-CM | POA: Diagnosis not present

## 2016-05-01 DIAGNOSIS — E119 Type 2 diabetes mellitus without complications: Secondary | ICD-10-CM

## 2016-05-01 NOTE — Progress Notes (Signed)
Diabetes Self-Management Education  Visit Type: First/Initial  Appt. Start Time: 8:30 Appt. End Time: 10:00  05/01/2016  [name], identified by name and date of birth, is a 50 y.o. female with a diagnosis of Diabetes: Type 2.   ASSESSMENT  Height  (1.575 m). There is no weight on file to calculate BMI.  Pt and her daughter wear tearful throughout the first half of the appointment due to concerns with diabetes and the pts hx of stroke. No recent A1C on file and pt does not know her A1C but she does report having number over 200 fasting.      Diabetes Self-Management Education - 05/01/16 0831    Visit Information   Visit Type First/Initial   Initial Visit   Diabetes Type Type 2   Are you currently following a meal plan? No   Are you taking your medications as prescribed? Yes   Date Diagnosed 2 years ago   Health Coping   How would you rate your overall health? Good   Psychosocial Assessment   Patient Belief/Attitude about Diabetes Other (comment)   Self-care barriers None   Pre-Education Assessment   Patient understands the diabetes disease and treatment process. Needs Instruction   Patient understands incorporating nutritional management into lifestyle. Needs Instruction   Patient undertands incorporating physical activity into lifestyle. Needs Instruction   Patient understands using medications safely. Needs Instruction   Patient understands monitoring blood glucose, interpreting and using results Needs Instruction   Patient understands prevention, detection, and treatment of acute complications. Needs Instruction   Patient understands prevention, detection, and treatment of chronic complications. Needs Instruction   Patient understands how to develop strategies to address psychosocial issues. Needs Instruction   Patient understands how to develop strategies to promote health/change behavior. Needs Instruction   Complications   How often do you check your blood  sugar? 1-2 times/day  has not been testing due to glucometer needing a battery   Fasting Blood glucose range (mg/dL) >161   Postprandial Blood glucose range (mg/dL) >096   Number of hyperglycemic episodes per week 7   Can you tell when your blood sugar is high? No   Have you had a dilated eye exam in the past 12 months? No   Have you had a dental exam in the past 12 months? No   Are you checking your feet? No   Dietary Intake   Breakfast none-------fast food biscuit   Lunch fast food salad   Snack (afternoon) cips   Dinner take out or fast food (pizza/burgers/fried chicken   Snack (evening) popcorn   Beverage(s) water, soda, kool aid   Exercise   Exercise Type ADL's   Patient Education   Previous Diabetes Education No   Disease state  Definition of diabetes, type 1 and 2, and the diagnosis of diabetes;Factors that contribute to the development of diabetes   Nutrition management  Food label reading, portion sizes and measuring food.;Carbohydrate counting;Role of diet in the treatment of diabetes and the relationship between the three main macronutrients and blood glucose level;Reviewed blood glucose goals for pre and post meals and how to evaluate the patients' food intake on their blood glucose level.;Information on hints to eating out and maintain blood glucose control.   Physical activity and exercise  Role of exercise on diabetes management, blood pressure control and cardiac health.;Helped patient identify appropriate exercises in relation to his/her diabetes, diabetes complications and other health issue.   Monitoring Taught/evaluated SMBG meter.;Purpose and frequency  of SMBG.;Taught/discussed recording of test results and interpretation of SMBG.;Identified appropriate SMBG and/or A1C goals.;Daily foot exams;Yearly dilated eye exam   Acute complications Discussed and identified patients' treatment of hyperglycemia.   Chronic complications Dental care;Retinopathy and reason for yearly  dilated eye exams   Psychosocial adjustment Worked with patient to identify barriers to care and solutions;Role of stress on diabetes;Helped patient identify a support system for diabetes management;Identified and addressed patients feelings and concerns about diabetes   Individualized Goals (developed by patient)   Physical Activity Exercise 1-2 times per week;15 minutes per day   Reducing Risk examine blood glucose patterns;increase portions of olive oil in diet;do foot checks daily;increase portions of healthy fats;increase portions of nuts and seeds   Post-Education Assessment   Patient understands the diabetes disease and treatment process. Demonstrates understanding / competency   Patient understands incorporating nutritional management into lifestyle. Demonstrates understanding / competency   Patient undertands incorporating physical activity into lifestyle. Demonstrates understanding / competency   Patient understands using medications safely. Demonstrates understanding / competency   Patient understands monitoring blood glucose, interpreting and using results Demonstrates understanding / competency   Patient understands prevention, detection, and treatment of acute complications. Demonstrates understanding / competency   Patient understands prevention, detection, and treatment of chronic complications. Demonstrates understanding / competency   Patient understands how to develop strategies to address psychosocial issues. Demonstrates understanding / competency   Patient understands how to develop strategies to promote health/change behavior. Demonstrates understanding / competency   Outcomes   Expected Outcomes Demonstrated interest in learning. Expect positive outcomes   Future DMSE PRN   Program Status Completed      Individualized Plan for Diabetes Self-Management Training:   Learning Objective:  Patient will have a greater understanding of diabetes self-management. Patient  education plan is to attend individual and/or group sessions per assessed needs and concerns.   Plan:   There are no Patient Instructions on file for this visit.  Expected Outcomes:  Demonstrated interest in learning. Expect positive outcomes  Education material provided: Living Well with Diabetes, Meal plan card and Snack sheet  If problems or questions, patient to contact team via:  Phone  Future DSME appointment: PRN

## 2016-05-17 ENCOUNTER — Telehealth: Payer: Self-pay | Admitting: Neurology

## 2016-05-17 NOTE — Telephone Encounter (Signed)
Patient's daughter is calling regarding the patient. She states the patient has had trouble with her memory since her stroke last October. For the last few days her memory has gotten worse. Her daughter is very concerned and does not know what to do.

## 2016-05-17 NOTE — Telephone Encounter (Signed)
RN spoke with patients daughter about her moms memory. Pt was just seen in June 2017 for stroke follow up. PTs daughter stated her moms gets confuse at times about about events and things they did previously. Appt was made with Dr. Roda Shutters.

## 2016-05-18 ENCOUNTER — Emergency Department (HOSPITAL_COMMUNITY): Payer: Commercial Managed Care - HMO

## 2016-05-18 ENCOUNTER — Inpatient Hospital Stay (HOSPITAL_COMMUNITY)
Admission: EM | Admit: 2016-05-18 | Discharge: 2016-05-21 | DRG: 065 | Disposition: A | Discharge status: Home / Self Care | Payer: Commercial Managed Care - HMO | Attending: Family Medicine | Admitting: Family Medicine

## 2016-05-18 ENCOUNTER — Encounter (HOSPITAL_COMMUNITY): Payer: Self-pay | Admitting: *Deleted

## 2016-05-18 DIAGNOSIS — E785 Hyperlipidemia, unspecified: Secondary | ICD-10-CM | POA: Diagnosis present

## 2016-05-18 DIAGNOSIS — E7800 Pure hypercholesterolemia, unspecified: Secondary | ICD-10-CM | POA: Diagnosis present

## 2016-05-18 DIAGNOSIS — Z7984 Long term (current) use of oral hypoglycemic drugs: Secondary | ICD-10-CM | POA: Diagnosis not present

## 2016-05-18 DIAGNOSIS — R410 Disorientation, unspecified: Secondary | ICD-10-CM

## 2016-05-18 DIAGNOSIS — Z823 Family history of stroke: Secondary | ICD-10-CM | POA: Diagnosis not present

## 2016-05-18 DIAGNOSIS — Z6841 Body Mass Index (BMI) 40.0 and over, adult: Secondary | ICD-10-CM | POA: Diagnosis not present

## 2016-05-18 DIAGNOSIS — I6330 Cerebral infarction due to thrombosis of unspecified cerebral artery: Secondary | ICD-10-CM | POA: Diagnosis not present

## 2016-05-18 DIAGNOSIS — Z833 Family history of diabetes mellitus: Secondary | ICD-10-CM

## 2016-05-18 DIAGNOSIS — I6340 Cerebral infarction due to embolism of unspecified cerebral artery: Principal | ICD-10-CM | POA: Diagnosis present

## 2016-05-18 DIAGNOSIS — E1165 Type 2 diabetes mellitus with hyperglycemia: Secondary | ICD-10-CM | POA: Diagnosis present

## 2016-05-18 DIAGNOSIS — Z87891 Personal history of nicotine dependence: Secondary | ICD-10-CM

## 2016-05-18 DIAGNOSIS — Z7982 Long term (current) use of aspirin: Secondary | ICD-10-CM

## 2016-05-18 DIAGNOSIS — Z8249 Family history of ischemic heart disease and other diseases of the circulatory system: Secondary | ICD-10-CM | POA: Diagnosis not present

## 2016-05-18 DIAGNOSIS — E119 Type 2 diabetes mellitus without complications: Secondary | ICD-10-CM | POA: Diagnosis present

## 2016-05-18 DIAGNOSIS — E1159 Type 2 diabetes mellitus with other circulatory complications: Secondary | ICD-10-CM

## 2016-05-18 DIAGNOSIS — I63331 Cerebral infarction due to thrombosis of right posterior cerebral artery: Secondary | ICD-10-CM | POA: Diagnosis not present

## 2016-05-18 DIAGNOSIS — I63522 Cerebral infarction due to unspecified occlusion or stenosis of left anterior cerebral artery: Secondary | ICD-10-CM

## 2016-05-18 DIAGNOSIS — I10 Essential (primary) hypertension: Secondary | ICD-10-CM | POA: Diagnosis present

## 2016-05-18 DIAGNOSIS — Z8673 Personal history of transient ischemic attack (TIA), and cerebral infarction without residual deficits: Secondary | ICD-10-CM | POA: Diagnosis not present

## 2016-05-18 DIAGNOSIS — I6789 Other cerebrovascular disease: Secondary | ICD-10-CM | POA: Diagnosis not present

## 2016-05-18 DIAGNOSIS — I6522 Occlusion and stenosis of left carotid artery: Secondary | ICD-10-CM | POA: Diagnosis not present

## 2016-05-18 DIAGNOSIS — I639 Cerebral infarction, unspecified: Secondary | ICD-10-CM | POA: Diagnosis not present

## 2016-05-18 DIAGNOSIS — Z79899 Other long term (current) drug therapy: Secondary | ICD-10-CM

## 2016-05-18 LAB — COMPREHENSIVE METABOLIC PANEL
ALT: 20 U/L (ref 14–54)
ANION GAP: 11 (ref 5–15)
AST: 18 U/L (ref 15–41)
Albumin: 3.9 g/dL (ref 3.5–5.0)
Alkaline Phosphatase: 67 U/L (ref 38–126)
BUN: 16 mg/dL (ref 6–20)
CHLORIDE: 104 mmol/L (ref 101–111)
CO2: 19 mmol/L — AB (ref 22–32)
Calcium: 9.2 mg/dL (ref 8.9–10.3)
Creatinine, Ser: 0.75 mg/dL (ref 0.44–1.00)
GFR calc non Af Amer: >60 mL/min (ref >60)
Glucose, Bld: 287 mg/dL — ABNORMAL HIGH (ref 65–99)
Potassium: 4.2 mmol/L (ref 3.5–5.1)
SODIUM: 134 mmol/L — AB (ref 135–145)
Total Bilirubin: 1.2 mg/dL (ref 0.3–1.2)
Total Protein: 7 g/dL (ref 6.5–8.1)

## 2016-05-18 LAB — URINALYSIS, ROUTINE W REFLEX MICROSCOPIC
Bilirubin Urine: NEGATIVE
Ketones, ur: NEGATIVE mg/dL
Nitrite: NEGATIVE
Protein, ur: 30 mg/dL — AB
SPECIFIC GRAVITY, URINE: 1.036 — AB (ref 1.005–1.030)
pH: 5 (ref 5.0–8.0)

## 2016-05-18 LAB — CBC
HCT: 38.1 % (ref 36.0–46.0)
HEMOGLOBIN: 13 g/dL (ref 12.0–15.0)
MCH: 31.2 pg (ref 26.0–34.0)
MCHC: 34.1 g/dL (ref 30.0–36.0)
MCV: 91.4 fL (ref 78.0–100.0)
Platelets: 288 10*3/uL (ref 150–400)
RBC: 4.17 MIL/uL (ref 3.87–5.11)
RDW: 13.3 % (ref 11.5–15.5)
WBC: 7.6 10*3/uL (ref 4.0–10.5)

## 2016-05-18 LAB — URINE MICROSCOPIC-ADD ON

## 2016-05-18 NOTE — Telephone Encounter (Signed)
Rn call patients daughter back about he mom getting more confuse. PTs daughter Annette Munoz stated her mom forgot she took a shower last night. She also forgot what today is. SHe is getting small things like routines she does daily. She is forgetting what she ate last night and has not eaten today. 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 Daughter stated she did a clients hair and it was done different from the pic she was shown. Pt has an appt on Monday with Dr.Xu at 0300pm. Rn stated to daughter that if she feels he mom is getting worse with confusion and memory take her to the nearest ED. Rn stated her mom may have a bad UTI infection or something else going on. Rn stated to let the ED do a work up and we will see her mom on Monday for an appt. MEssage will be sent to Dr. Roda Shutters. PTs daughter verbalized understanding.

## 2016-05-18 NOTE — ED Notes (Signed)
Pt. And family wanting to know when MRI will take place  MRI called

## 2016-05-18 NOTE — Telephone Encounter (Signed)
Pt's daughter called in this morning concerned about her mother. States she does not know what day it is. She knows pt has appt on Monday but is requesting to speak with the nurse today. Please call and advise [phone]

## 2016-05-18 NOTE — Consult Note (Signed)
Neurology Consultation Reason for Consult: Stroke Referring Physician: Maryfrances Bunnell, C  CC: Memory loss  History is obtained from: Patient on the daughter  HPI: Annette Munoz is a 50 y.o. female with a history of previous stroke, hypertension, diabetes who presents with memory loss for the past couple of days. Her daughter states that she was in her normal self on Monday, however on Tuesday she began having issues with her memory. Her daughter noticed that she didn't seem to be remembering things that she clearly done. She was able to continue to navigate her daily activities, with the exception of the daughter feels that she had to help her more with cooking than she typically does. She even was able to work on Tuesday, though it is not clear if she performed up to her typical standard.   LKW: 8/1 tpa given?: no, out of window    ROS: A 14 point ROS was performed and is negative except as noted in the HPI.  Past Medical History:  Diagnosis Date  . Diabetes mellitus without complication (HCC)   . Hypertension   . Stroke [facility])      Family History  Problem Relation Age of Onset  . Diabetes Mother   . Heart disease Mother   . Hypertension Mother   . Hypertension Father   . Stroke Father   . Stroke Maternal Grandmother      Social History:  reports that she has never smoked. She has never used smokeless tobacco. She reports that she drinks about 0.6 oz of alcohol per week . She reports that she does not use drugs.   Exam: Current vital signs: BP 199/97 (BP Location: Right Arm) Comment: DR. Anise Salvo NOTIFIED ON PT.'S HYPERTENSION  AT BEDSIDE   Pulse (!) 59   Temp 97.9 F (36.6 C)   Resp 16   LMP 05/18/2016   SpO2 99%  Vital signs in last 24 hours: Temp:  [97.9 F (36.6 C)-98.4 F (36.9 C)] 97.9 F (36.6 C) (08/03 2026) Pulse Rate:  [59-91] 59 (08/03 2027) Resp:  [13-18] 16 (08/03 2027) BP: (173-199)/(72-97) 199/97 (08/03 2027) SpO2:  [96 %-100 %] 99 % (08/03  2027)   Physical Exam  Constitutional: Appears well-developed and well-nourished.  Psych: Affect appropriate to situation, appears tearful Eyes: No scleral injection HENT: No OP obstrucion Head: Normocephalic.  Cardiovascular: Normal rate and regular rhythm.  Respiratory: Effort normal and breath sounds normal to anterior ascultation GI: Soft.  No distension. There is no tenderness.  Skin: WDI  Neuro: Mental Status: Patient is awake, alert, oriented to person, month, year. She does not know which hospital but she is at. Unable to give certain details over the previous few days. Patient is able to give a clear and coherent history. No signs of aphasia or neglect Cranial Nerves: II: Visual Fields are full. Pupils are equal, round, and reactive to light.   III,IV, VI: EOMI without ptosis or diploplia.  V: Facial sensation is symmetric to temperature VII: Facial movement is symmetric.  VIII: hearing is intact to voice X: Uvula elevates symmetrically XI: Shoulder shrug is symmetric. XII: tongue is midline without atrophy or fasciculations.  Motor: Tone is normal. Bulk is normal. 5/5 strength was present in all four extremities.  Sensory: Sensation is symmetric to light touch and temperature in the arms and legs. Cerebellar: FNF and HKS are intact bilaterally      I have reviewed labs in epic and the results pertinent to this consultation are: CMP-elevated glucose  I have reviewed the images obtained: MRI brain-small thalamic infarct  Impression: 49 year old female with likely small vessel ischemic infarct. She will need admission for occupational therapy evaluation as well as secondary stroke risk factor modification. In favor permissive hypertension for tonight, but overall I think this will be a mainstay of secondary stroke prevention in the future along with diabetes control.  Recommendations: 1. HgbA1c, fasting lipid panel 2. Frequent neuro checks 3. Echocardiogram 4.  Carotid dopplers 5. Prophylactic therapy-Antiplatelet med: Aspirin - dose 325mg  PO or 300mg  PR 6. Risk factor modification 7. Telemetry monitoring 8. PT consult, OT consult, Speech consult 9. please page stroke NP  Or  PA  Or MD  from 8am -4 pm starting 8/4 as this patient will be followed by the stroke team at this point.   You can look them up on [email or web address]      Ritta Slot, MD [facility] [phone]  If 7pm- 7am, please page neurology on call as listed in AMION.

## 2016-05-18 NOTE — Telephone Encounter (Signed)
Hi, Katrina:  I totally agree with you. She should go to ED if family feels some acute changes or acute confusion. I will see her Monday. Thanks.   Marvel Plan, MD PhD Stroke Neurology 05/18/2016 1:59 PM

## 2016-05-18 NOTE — ED Notes (Signed)
Notified by staff that pt had an episode of v-tach.  Pt denies having any symptoms during or after episode of v-tach.  MD notified of pt's episode of v-tach.  Awaiting further orders.

## 2016-05-18 NOTE — H&P (Signed)
History and Physical  Patient Name: Annette Munoz     KGS:[id number]    DOB: 1966/09/15    DOA: 05/18/2016 PCP: Elizabeth Sauer   Patient coming from: Home     Chief Complaint: Forgetfulness for 2 days  HPI: Annette Munoz is a 50 y.o. female with a past medical history significant for NIDDM, HTN and stroke last fall who presents with new acute forgetfulness.  The patient has poorly controlled hypertension and diabetes, experienced an ischemic stroke last October.  After that stroke, family note she had some mild short term memory loss, but was functional, no other residual deficits.  Now, two days ago Tuesday, the patient's daughter noticed she was substantially more forgetful suddenly than usual.  She couldn't remember who had given her new [place], she couldn't remember what day it was, and she couldn't remember events from the day before. The patient was unaware of this, and she and the daughter both deny any focal weakness, speech disturbance, numbness, headache, syncope, visual changes. Finally today, the patient's daughter convinced her to come to the ER.  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  Past Medical History:  Diagnosis Date  . Diabetes mellitus without complication ([place])   . Hypertension   . Stroke [facility])     Past Surgical History:  Procedure Laterality Date  . CESAREAN SECTION    . TEE WITHOUT CARDIOVERSION N/A 08/10/2015   Procedure: TRANSESOPHAGEAL ECHOCARDIOGRAM (TEE);  Surgeon: [name], MD;  Location: [facility] ENDOSCOPY;   Service: Cardiovascular;  Laterality: N/A;    Social History: Patient lives with her husband and daughter and son.  Patient walks unassisted.  She smoked very remotely.  She does not use alcohol to excess.  She is a Theme park manager, still working from home.    No Known Allergies  Family history: family history includes Diabetes in her mother; Heart disease in her mother; Hypertension in her father and mother; Stroke in her father and maternal grandmother.  Prior to Admission medications   Medication Sig Start Date End Date Taking? Authorizing Provider  Alcohol Swabs (ALCOHOL PREP) PADS Use to check blood sugar daily. E11.65 09/16/15  Yes Mancel Bale, PA-C  aspirin 325 MG tablet Take 1 tablet (325 mg total) by mouth daily. 12/20/15  Yes Rosalin Hawking, MD  atorvastatin (LIPITOR) 80 MG tablet TAKE 1 TABLET (80 MG TOTAL) BY MOUTH DAILY AT 6 PM. 12/20/15  Yes Rosalin Hawking, MD  blood glucose meter kit and supplies KIT Use daily to check blood sugar. E11.65 09/16/15  Yes Sarah Alleen Borne, PA-C  Blood Pressure Monitoring (PREMIUM AUTOMATIC BP MONITOR) DEVI 1 Device by Does not apply route daily. 08/17/15  Yes Mancel Bale, PA-C  glucose blood test strip Use to check blood sugar daily. E11.65 09/16/15  Yes Mancel Bale, PA-C  Lancet Devices (LANCING DEVICE) MISC Use daily to check blood sugar. E11.65 09/16/15  Yes Mancel Bale, PA-C  Lancets MISC Use to check blood sugar daily. E11.65 09/16/15  Yes Mancel Bale, PA-C  lisinopril (  PRINIVIL,ZESTRIL) 40 MG tablet Take 1 tablet (40 mg total) by mouth daily. 12/20/15  Yes Rosalin Hawking, MD  metFORMIN (GLUCOPHAGE XR) 500 MG 24 hr tablet Take 2 tablets (1,000 mg total) by mouth 2 (two) times daily. 08/12/15  Yes Shanker Kristeen Mans, MD     Physical Exam: BP 199/97 (BP Location: Right Arm) Comment: DR. Tobias Alexander NOTIFIED ON PT.'S HYPERTENSION  AT BEDSIDE   Pulse (!) 59   Temp 97.9 F (36.6 C)   Resp 16   LMP 05/18/2016   SpO2 99%  General appearance: Well-developed, obese  adult female, alert and in no acute distress.   Eyes: Anicteric, conjunctiva pink, lids and lashes normal.     ENT: No nasal deformity, discharge, or epistaxis.  OP moist without lesions.  Crying. Skin: Warm and dry.  No suspicious rashes or lesions. Cardiac: RRR, nl S1-S2, no murmurs appreciated.  Capillary refill is brisk.  JVP normal.  No LE edema.  Radial and DP pulses 2+ and symmetric.  No carotid bruits. Respiratory: Normal respiratory rate and rhythm.  CTAB without rales or wheezes. GI: Abdomen soft without rigidity.  No TTP. No ascites, distension.   MSK: No deformities or effusions. Neuro: Pupils are 4 mm and reactive to 3 mm. Extraocular movements are intact, without nystagmus. Cranial nerve 5 is within normal limits. Cranial nerve 7 is symmetrical. Cranial nerve 8 is within normal limits. Cranial nerves 9 and 10 reveal equal palate elevation. Cranial nerve 11 reveals sternocleidomastoid strong. Cranial nerve 12 is midline. I do not note a deficit in motor strength testing in the upper and lower extremities bilaterally with normal motor, tone and bulk. Reflexes normal and symmetric at patella.  Finger-to-nose testing is within normal limits. Speech is fluent. Naming is grossly intact. Recall is impaired. Attention span and concentration are within normal limits.   Psych: Behavior appropriate.  Affect tearful.  No evidence of aural or visual hallucinations or delusions.       Labs on Admission:  I have personally reviewed following labs and imaging studies: CBC:  Recent Labs Lab 05/18/16 1434  WBC 7.6  HGB 13.0  HCT 38.1  MCV 91.4  PLT 245   Basic Metabolic Panel:  Recent Labs Lab 05/18/16 1434  NA 134*  K 4.2  CL 104  CO2 19*  GLUCOSE 287*  BUN 16  CREATININE 0.75  CALCIUM 9.2   GFR: CrCl cannot be calculated (Unknown ideal weight.). Liver Function Tests:  Recent Labs Lab 05/18/16 1434  AST 18  ALT 20  ALKPHOS 67  BILITOT 1.2  PROT 7.0  ALBUMIN  3.9     Radiological Exams on Admission: Mr Virgel Paling YK Contrast  Result Date: 05/18/2016 CLINICAL DATA:  Confusion for 2 days. No other focal neuro deficits. EXAM: MRI HEAD WITHOUT CONTRAST MRA HEAD WITHOUT CONTRAST TECHNIQUE: Multiplanar, multiecho pulse sequences of the brain and surrounding structures were obtained without intravenous contrast. Angiographic images of the head were obtained using MRA technique without contrast. COMPARISON:  CT angiography of the head 08/10/2015. MRI and MRA 08/07/2015. FINDINGS: MRI HEAD FINDINGS Since the previous study, there is a new area of acute infarction, 1 x 1 x 1.5 cm, involving the RIGHT hemisphere medial and anterior most thalamus, as well as the genu of the RIGHT internal capsule displaying restricted diffusion. No other areas of acute infarction are seen. No mass lesion, hydrocephalus, acute hemorrhage, or extra-axial fluid. Mild atrophy. Minor white matter disease. Chronic LEFT ACA territory infarcts involving the corpus  callosum anterior to the frontal horn, and LEFT cingulate gyrus. No midline abnormality. Flow voids are maintained. Extracranial soft tissues unremarkable. MRA HEAD FINDINGS Both internal carotid arteries are patent, with no significant irregularity on the RIGHT, and only mild irregularity on the LEFT, possible 50% cavernous and supraclinoid stenosis. ICA termini bilaterally are widely patent. On the RIGHT, the fifth A1 and M1 segments are widely patent on the LEFT, there is mild irregularity of the proximal M1 segment with a 75% stenosis of the proximal LEFT A1. In the LEFT anterior cerebral artery there is severe disease distal to the callosum marginal branch. On the RIGHT there are severe tandem stenoses in the A2 segments. No MCA branch occlusion. In the posterior circulation, the basilar is widely patent with LEFT vertebral dominant. The RIGHT vertebral is patent and contributes primarily to the RIGHT calf. Appear moderately diseased in  their P2 segments, greater on the RIGHT. No definite cerebellar branch occlusion. No intracranial aneurysm. IMPRESSION: New area of acute infarction, medial RIGHT hemisphere affecting anterior-most thalamus as well as the genu of the internal capsule. It is unclear if this involves the anterior choroidal artery territory, or medial thalamoperforate territory. MCA lenticulostriate territory involvement is unlikely. Chronic changes as described, with resolved predominantly left-sided infarcts in 2016. Similar pattern of intracranial atherosclerosis when compared with prior CTA from 08/10/2015. Electronically Signed   By: Staci Righter M.D.   On: 05/18/2016 19:44   Mr Brain Wo Contrast (neuro Protocol)  Result Date: 05/18/2016 CLINICAL DATA:  Confusion for 2 days. No other focal neuro deficits. EXAM: MRI HEAD WITHOUT CONTRAST MRA HEAD WITHOUT CONTRAST TECHNIQUE: Multiplanar, multiecho pulse sequences of the brain and surrounding structures were obtained without intravenous contrast. Angiographic images of the head were obtained using MRA technique without contrast. COMPARISON:  CT angiography of the head 08/10/2015. MRI and MRA 08/07/2015. FINDINGS: MRI HEAD FINDINGS Since the previous study, there is a new area of acute infarction, 1 x 1 x 1.5 cm, involving the RIGHT hemisphere medial and anterior most thalamus, as well as the genu of the RIGHT internal capsule displaying restricted diffusion. No other areas of acute infarction are seen. No mass lesion, hydrocephalus, acute hemorrhage, or extra-axial fluid. Mild atrophy. Minor white matter disease. Chronic LEFT ACA territory infarcts involving the corpus callosum anterior to the frontal horn, and LEFT cingulate gyrus. No midline abnormality. Flow voids are maintained. Extracranial soft tissues unremarkable. MRA HEAD FINDINGS Both internal carotid arteries are patent, with no significant irregularity on the RIGHT, and only mild irregularity on the LEFT, possible 50%  cavernous and supraclinoid stenosis. ICA termini bilaterally are widely patent. On the RIGHT, the fifth A1 and M1 segments are widely patent on the LEFT, there is mild irregularity of the proximal M1 segment with a 75% stenosis of the proximal LEFT A1. In the LEFT anterior cerebral artery there is severe disease distal to the callosum marginal branch. On the RIGHT there are severe tandem stenoses in the A2 segments. No MCA branch occlusion. In the posterior circulation, the basilar is widely patent with LEFT vertebral dominant. The RIGHT vertebral is patent and contributes primarily to the RIGHT calf. Appear moderately diseased in their P2 segments, greater on the RIGHT. No definite cerebellar branch occlusion. No intracranial aneurysm. IMPRESSION: New area of acute infarction, medial RIGHT hemisphere affecting anterior-most thalamus as well as the genu of the internal capsule. It is unclear if this involves the anterior choroidal artery territory, or medial thalamoperforate territory. MCA lenticulostriate territory involvement is  unlikely. Chronic changes as described, with resolved predominantly left-sided infarcts in 2016. Similar pattern of intracranial atherosclerosis when compared with prior CTA from 08/10/2015. Electronically Signed   By: Staci Righter M.D.   On: 05/18/2016 19:44     EKG: Independently reviewed. Rate 89, isnus rhythm.  QTc normal.  No ST changes.   Echocardiogram Oct 2016: EF 93-81% without embolic source identified.    Assessment/Plan 1. Acute Stroke:  This is new.  MRI shows new infarct.   -Admit to telemetry -Neuro checks, NIHSS per protocol -Daily aspirin 325 mg is continued -Continue Lipitor 80 mg -Permissive hypertension for now, will only treat if SBP >220/110 mmHg -Lipids, hemoglobin A1c to check adherence -Carotid doppler per Neurology, ordered -Echocardiogram ordered -PT/OT/SLP consultation -Consult to Neurology, appreciate recommendations   2. NIDDM:    -Check adherence with HgbA1c and add second oral agent or starting glargine -Hold metformin -Sliding scale corrections -Low dose glargine while in hospital -Encourage q3 or 6 months follow up with PCP Weber over short term  3. HTN:  -Permissive hypertension for now, hold lisinopril      DVT prophylaxis: Lovenox  Code Status: FULL  Family Communication: Daughter at bedside  Disposition Plan: Anticipate Stroke work up as above and consult to ancillary services.  Expect discharge within 2-3 days. Consults called: Neurology, Dr. Leonel Ramsay has seen patient. Admission status: Telemetry, INPATIENT status  Core measures: -VTE prophylaxis ordered at time of admission -Aspirin ordered at admission -Atrial fibrillation: not present on 30 day monitor last fall -tPA not given because of outside tPA window -Dysphagia screen ordered in ER -Lipids ordered -PT eval ordered -Non-smoker    Medical decision making: Patient seen at 8:30 PM on 05/18/2016.  The patient was discussed with Dr. Leonel Ramsay and Gloriann Loan, PA-C. What exists of the patient's chart was reviewed in depth.  Clinical condition: stable.       Edwin Dada [facility] Pager [phone]

## 2016-05-18 NOTE — ED Triage Notes (Signed)
Pt and family member reports pt being confused and altered since Tuesday morning. Pt is able to say the day, unsure of year, month, president etc. No other neuro deficits noted at triage.

## 2016-05-18 NOTE — ED Provider Notes (Signed)
[facility] DEPT Provider Note   CSN: [id number] Arrival date & time: 05/18/16  1403  First Provider Contact:  First MD Initiated Contact with Patient 05/18/16 1613        History   Chief Complaint Chief Complaint  Patient presents with  . Altered Mental Status    HPI Annette Munoz is a 50 y.o. female.  HPI Annette Munoz is a 50 y.o. female with PMH significant for DM, HTN, CVA (October 2016--Neurologist Dr. Erlinda Hong) with residual memory issues, medical noncompliance BIB daughter who presents with sudden worsening memory issues x 2 days.  Daughter states patient is having difficulty recalling the day and will repeatedly ask questions about the same issue.  Daughter states this is how she presented in October.  Daughter states it seems she has trouble with her short term memory.  She is anticoagulated with ASA and reports she has been taking Lipitor and been compliant with BP and DM medications.  No aggravating or modifying factors.  No HA, numbness, weakness, visual disturbance, CP, SOB, abdominal pain, or urinary symptoms.   Past Medical History:  Diagnosis Date  . Diabetes mellitus without complication ([place])   . Hypertension   . Stroke [facility])     Patient Active Problem List   Diagnosis Date Noted  . Palpitations 09/30/2015  . Benign essential HTN 09/30/2015  . 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 Obesity 09/30/2015  . Diabetes type 2, uncontrolled ([place])   . Uncontrolled type 2 diabetes mellitus with circulatory disorder, without long-term current use of insulin ([place])   . Essential hypertension   . HLD (hyperlipidemia)   . Cerebrovascular accident (CVA) due to thrombosis of cerebral artery ([place])   . CVA (cerebral infarction) 08/07/2015  . DM (diabetes mellitus), type 2, uncontrolled ([place]) 10/04/2009  . Pure hypercholesterolemia 10/04/2009    Past Surgical History:  Procedure Laterality Date  . CESAREAN SECTION    . TEE WITHOUT  CARDIOVERSION N/A 08/10/2015   Procedure: TRANSESOPHAGEAL ECHOCARDIOGRAM (TEE);  Surgeon: [name], MD;  Location: [facility] ENDOSCOPY;  Service: Cardiovascular;  Laterality: N/A;    OB History    No data available       Home Medications    Prior to Admission medications   Medication Sig Start Date End Date Taking? Authorizing Provider  Alcohol Swabs (ALCOHOL PREP) PADS Use to check blood sugar daily. E11.65 09/16/15  Yes Mancel Bale, PA-C  aspirin 325 MG tablet Take 1 tablet (325 mg total) by mouth daily. 12/20/15  Yes Rosalin Hawking, MD  atorvastatin (LIPITOR) 80 MG tablet TAKE 1 TABLET (80 MG TOTAL) BY MOUTH DAILY AT 6 PM. 12/20/15  Yes Rosalin Hawking, MD  blood glucose meter kit and supplies KIT Use daily to check blood sugar. E11.65 09/16/15  Yes Sarah Alleen Borne, PA-C  Blood Pressure Monitoring (PREMIUM AUTOMATIC BP MONITOR) DEVI 1 Device by Does not apply route daily. 08/17/15  Yes Mancel Bale, PA-C  glucose blood test strip Use to check blood sugar daily. E11.65 09/16/15  Yes Mancel Bale, PA-C  Lancet Devices (LANCING DEVICE) MISC Use daily to check blood sugar. E11.65 09/16/15  Yes Mancel Bale, PA-C  Lancets MISC Use to check blood sugar daily. E11.65 09/16/15  Yes Sarah Alleen Borne, PA-C  lisinopril (PRINIVIL,ZESTRIL) 40 MG tablet Take 1 tablet (40 mg total) by mouth daily. 12/20/15  Yes Rosalin Hawking, MD  metFORMIN (GLUCOPHAGE XR) 500 MG 24 hr tablet Take 2 tablets (1,000  mg total) by mouth 2 (two) times daily. 08/12/15  Yes Shanker Kristeen Mans, MD    Family History Family History  Problem Relation Age of Onset  . Diabetes Mother   . Heart disease Mother   . Hypertension Mother   . Hypertension Father   . Stroke Father   . Stroke Maternal Grandmother     Social History Social History  Substance Use Topics  . Smoking status: Never Smoker  . Smokeless tobacco: Not on file  . Alcohol use 0.6 oz/week    1 Shots of liquor per week     Comment: occasionally     Allergies   Review of  patient's allergies indicates no known allergies.   Review of Systems Review of Systems All other systems negative unless otherwise stated in HPI   Physical Exam Updated Vital Signs BP 185/72 (BP Location: Right Arm)   Pulse 62   Temp 98.4 F (36.9 C) (Oral)   Resp 16   LMP 05/18/2016   SpO2 100%   Physical Exam  Constitutional: She is oriented to person, place, and time. She appears well-developed and well-nourished.  Non-toxic appearance. She does not have a sickly appearance. She does not appear ill.  HENT:  Head: Normocephalic and atraumatic.  Mouth/Throat: Oropharynx is clear and moist.  Eyes: Conjunctivae are normal. Pupils are equal, round, and reactive to light.  Neck: Normal range of motion. Neck supple.  Cardiovascular: Normal rate and regular rhythm.   Pulmonary/Chest: Effort normal and breath sounds normal. No accessory muscle usage or stridor. No respiratory distress. She has no wheezes. She has no rhonchi. She has no rales.  Abdominal: Soft. Bowel sounds are normal. She exhibits no distension. There is no tenderness.  Musculoskeletal: Normal range of motion.  Lymphadenopathy:    She has no cervical adenopathy.  Neurological: She is alert and oriented to person, place, and time. No cranial nerve deficit. She exhibits normal muscle tone. Coordination normal.  Speech clear without dysarthria. Cranial nerves grossly intact.  Strength and sensation intact.  No pronator drift.  Normal finger to nose.  Skin: Skin is warm and dry.  Psychiatric: She has a normal mood and affect. Her behavior is normal.     ED Treatments / Results  Labs (all labs ordered are listed, but only abnormal results are displayed) Labs Reviewed  COMPREHENSIVE METABOLIC PANEL - Abnormal; Notable for the following:       Result Value   Sodium 134 (*)    CO2 19 (*)    Glucose, Bld 287 (*)    All other components within normal limits  URINALYSIS, ROUTINE W REFLEX MICROSCOPIC (NOT AT [facility]) -  Abnormal; Notable for the following:    Color, Urine AMBER (*)    APPearance TURBID (*)    Specific Gravity, Urine 1.036 (*)    Glucose, UA >1000 (*)    Hgb urine dipstick LARGE (*)    Protein, ur 30 (*)    Leukocytes, UA SMALL (*)    All other components within normal limits  URINE MICROSCOPIC-ADD ON - Abnormal; Notable for the following:    Squamous Epithelial / LPF 6-30 (*)    Bacteria, UA MANY (*)    All other components within normal limits  URINE CULTURE  CBC    EKG  EKG Interpretation None       Radiology Mr Jodene Nam Head Wo Contrast  Result Date: 05/18/2016 CLINICAL DATA:  Confusion for 2 days. No other focal neuro deficits. EXAM: MRI HEAD  WITHOUT CONTRAST MRA HEAD WITHOUT CONTRAST TECHNIQUE: Multiplanar, multiecho pulse sequences of the brain and surrounding structures were obtained without intravenous contrast. Angiographic images of the head were obtained using MRA technique without contrast. COMPARISON:  CT angiography of the head 08/10/2015. MRI and MRA 08/07/2015. FINDINGS: MRI HEAD FINDINGS Since the previous study, there is a new area of acute infarction, 1 x 1 x 1.5 cm, involving the RIGHT hemisphere medial and anterior most thalamus, as well as the genu of the RIGHT internal capsule displaying restricted diffusion. No other areas of acute infarction are seen. No mass lesion, hydrocephalus, acute hemorrhage, or extra-axial fluid. Mild atrophy. Minor white matter disease. Chronic LEFT ACA territory infarcts involving the corpus callosum anterior to the frontal horn, and LEFT cingulate gyrus. No midline abnormality. Flow voids are maintained. Extracranial soft tissues unremarkable. MRA HEAD FINDINGS Both internal carotid arteries are patent, with no significant irregularity on the RIGHT, and only mild irregularity on the LEFT, possible 50% cavernous and supraclinoid stenosis. ICA termini bilaterally are widely patent. On the RIGHT, the fifth A1 and M1 segments are widely patent on  the LEFT, there is mild irregularity of the proximal M1 segment with a 75% stenosis of the proximal LEFT A1. In the LEFT anterior cerebral artery there is severe disease distal to the callosum marginal branch. On the RIGHT there are severe tandem stenoses in the A2 segments. No MCA branch occlusion. In the posterior circulation, the basilar is widely patent with LEFT vertebral dominant. The RIGHT vertebral is patent and contributes primarily to the RIGHT calf. Appear moderately diseased in their P2 segments, greater on the RIGHT. No definite cerebellar branch occlusion. No intracranial aneurysm. IMPRESSION: New area of acute infarction, medial RIGHT hemisphere affecting anterior-most thalamus as well as the genu of the internal capsule. It is unclear if this involves the anterior choroidal artery territory, or medial thalamoperforate territory. MCA lenticulostriate territory involvement is unlikely. Chronic changes as described, with resolved predominantly left-sided infarcts in 2016. Similar pattern of intracranial atherosclerosis when compared with prior CTA from 08/10/2015. Electronically Signed   By: Staci Righter M.D.   On: 05/18/2016 19:44   Mr Brain Wo Contrast (neuro Protocol)  Result Date: 05/18/2016 CLINICAL DATA:  Confusion for 2 days. No other focal neuro deficits. EXAM: MRI HEAD WITHOUT CONTRAST MRA HEAD WITHOUT CONTRAST TECHNIQUE: Multiplanar, multiecho pulse sequences of the brain and surrounding structures were obtained without intravenous contrast. Angiographic images of the head were obtained using MRA technique without contrast. COMPARISON:  CT angiography of the head 08/10/2015. MRI and MRA 08/07/2015. FINDINGS: MRI HEAD FINDINGS Since the previous study, there is a new area of acute infarction, 1 x 1 x 1.5 cm, involving the RIGHT hemisphere medial and anterior most thalamus, as well as the genu of the RIGHT internal capsule displaying restricted diffusion. No other areas of acute infarction  are seen. No mass lesion, hydrocephalus, acute hemorrhage, or extra-axial fluid. Mild atrophy. Minor white matter disease. Chronic LEFT ACA territory infarcts involving the corpus callosum anterior to the frontal horn, and LEFT cingulate gyrus. No midline abnormality. Flow voids are maintained. Extracranial soft tissues unremarkable. MRA HEAD FINDINGS Both internal carotid arteries are patent, with no significant irregularity on the RIGHT, and only mild irregularity on the LEFT, possible 50% cavernous and supraclinoid stenosis. ICA termini bilaterally are widely patent. On the RIGHT, the fifth A1 and M1 segments are widely patent on the LEFT, there is mild irregularity of the proximal M1 segment with a 75% stenosis of  the proximal LEFT A1. In the LEFT anterior cerebral artery there is severe disease distal to the callosum marginal branch. On the RIGHT there are severe tandem stenoses in the A2 segments. No MCA branch occlusion. In the posterior circulation, the basilar is widely patent with LEFT vertebral dominant. The RIGHT vertebral is patent and contributes primarily to the RIGHT calf. Appear moderately diseased in their P2 segments, greater on the RIGHT. No definite cerebellar branch occlusion. No intracranial aneurysm. IMPRESSION: New area of acute infarction, medial RIGHT hemisphere affecting anterior-most thalamus as well as the genu of the internal capsule. It is unclear if this involves the anterior choroidal artery territory, or medial thalamoperforate territory. MCA lenticulostriate territory involvement is unlikely. Chronic changes as described, with resolved predominantly left-sided infarcts in 2016. Similar pattern of intracranial atherosclerosis when compared with prior CTA from 08/10/2015. Electronically Signed   By: Staci Righter M.D.   On: 05/18/2016 19:44    Procedures Procedures (including critical care time)  Medications Ordered in ED Medications - No data to display   Initial  Impression / Assessment and Plan / ED Course  I have reviewed the triage vital signs and the nursing notes.  Pertinent labs & imaging results that were available during my care of the patient were reviewed by me and considered in my medical decision making (see chart for details).  Clinical Course   Patient with hx of CVA (October 2016), DM, HTN who presents with suddenly worsening memory problems described as recalling the day and short term memory.  No other symptoms.  Per chart review, patient w/ hx of non-compliance and memory issues since the stroke.  Sees Dr. Erlinda Hong.  Had negative hypercoagulable workup.  Daughter states this is similar to previous stroke presentation.  VSS, NAD.  No focal neurological deficits.  At triage, oriented to day, but not year, month, or president.  No urinary symptoms.  Labs show glucose 287, UA appears contaminated, culture sent.  No metabolic derangement to suggest DKA. Given acute worsening of symptoms will obtain MRI.   MRI does show a new area of acute infarction. Spoke with neurology, Dr. Leonel Ramsay.  Will admit to medicine for risk factor stratification.  Appreciate Dr. Loleta Books.   Final Clinical Impressions(s) / ED Diagnoses   Final diagnoses:  Cerebral infarction due to unspecified mechanism    New Prescriptions New Prescriptions   No medications on file     Vivien Rossetti 05/18/16 2031    Charlesetta Shanks, MD 05/18/16 2335

## 2016-05-18 NOTE — ED Notes (Signed)
Pt. In MRI.

## 2016-05-19 ENCOUNTER — Inpatient Hospital Stay (HOSPITAL_COMMUNITY): Payer: Commercial Managed Care - HMO

## 2016-05-19 DIAGNOSIS — E7800 Pure hypercholesterolemia, unspecified: Secondary | ICD-10-CM

## 2016-05-19 DIAGNOSIS — I6522 Occlusion and stenosis of left carotid artery: Secondary | ICD-10-CM

## 2016-05-19 DIAGNOSIS — I639 Cerebral infarction, unspecified: Secondary | ICD-10-CM

## 2016-05-19 DIAGNOSIS — I6789 Other cerebrovascular disease: Secondary | ICD-10-CM

## 2016-05-19 DIAGNOSIS — I10 Essential (primary) hypertension: Secondary | ICD-10-CM

## 2016-05-19 DIAGNOSIS — I63331 Cerebral infarction due to thrombosis of right posterior cerebral artery: Secondary | ICD-10-CM

## 2016-05-19 LAB — LIPID PANEL
CHOLESTEROL: 133 mg/dL (ref 0–200)
HDL: 32 mg/dL — ABNORMAL LOW (ref >40)
LDL CALC: 83 mg/dL (ref 0–99)
TRIGLYCERIDES: 89 mg/dL (ref <150)
Total CHOL/HDL Ratio: 4.2 RATIO
VLDL: 18 mg/dL (ref 0–40)

## 2016-05-19 LAB — RAPID URINE DRUG SCREEN, HOSP PERFORMED
AMPHETAMINES: NOT DETECTED
BARBITURATES: NOT DETECTED
Benzodiazepines: NOT DETECTED
COCAINE: NOT DETECTED
OPIATES: NOT DETECTED
TETRAHYDROCANNABINOL: NOT DETECTED

## 2016-05-19 LAB — VAS US CAROTID
LCCADSYS: 47 cm/s
LCCAPSYS: 61 cm/s
LEFT ECA DIAS: -10 cm/s
LEFT VERTEBRAL DIAS: 29 cm/s
Left CCA dist dias: 11 cm/s
Left CCA prox dias: 14 cm/s
Left ICA dist dias: -22 cm/s
Left ICA dist sys: -51 cm/s
Left ICA prox dias: -18 cm/s
Left ICA prox sys: -53 cm/s
RCCADSYS: -57 cm/s
RCCAPDIAS: 14 cm/s
RCCAPSYS: 69 cm/s
RIGHT ECA DIAS: -11 cm/s
RIGHT VERTEBRAL DIAS: 12 cm/s

## 2016-05-19 LAB — ECHOCARDIOGRAM COMPLETE
CHL CUP MV DEC (S): 222
E decel time: 222 msec
E/e' ratio: 15.84
FS: 42 % (ref 28–44)
Height: 62 in
IV/PV OW: 1.07
LA diam end sys: 30 mm
LA vol A4C: 68.2 ml
LADIAMINDEX: 1.49 cm/m2
LASIZE: 30 mm
LAVOL: 84.7 mL
LAVOLIN: 41.9 mL/m2
LV E/e' medial: 15.84
LV PW d: 11.3 mm — AB (ref 0.6–1.1)
LV TDI E'LATERAL: 5.51
LV e' LATERAL: 5.51 cm/s
LVEEAVG: 15.84
LVOT area: 2.84 cm2
LVOTD: 19 mm
MV Peak grad: 3 mmHg
MV pk A vel: 98.5 m/s
MVPKEVEL: 87.3 m/s
TDI e' medial: 7.54
Weight: 3660.8 oz

## 2016-05-19 LAB — GLUCOSE, CAPILLARY
GLUCOSE-CAPILLARY: 217 mg/dL — AB (ref 65–99)
Glucose-Capillary: 282 mg/dL — ABNORMAL HIGH (ref 65–99)
Glucose-Capillary: 263 mg/dL — ABNORMAL HIGH (ref 65–99)

## 2016-05-19 LAB — URINE CULTURE

## 2016-05-19 MED ORDER — ENOXAPARIN SODIUM 40 MG/0.4ML ~~LOC~~ SOLN
40.0000 mg | SUBCUTANEOUS | Status: DC
  Administered 2016-05-19 – 2016-05-21 (×3): 40 mg via SUBCUTANEOUS
  Filled 2016-05-19 (×3): qty 0.4

## 2016-05-19 MED ORDER — SENNOSIDES-DOCUSATE SODIUM 8.6-50 MG PO TABS: 1.0000 | ORAL_TABLET | Freq: Every evening | ORAL | Status: DC | PRN

## 2016-05-19 MED ORDER — CLOPIDOGREL BISULFATE 75 MG PO TABS
75.0000 mg | ORAL_TABLET | Freq: Every day | ORAL | Status: DC
  Administered 2016-05-19 – 2016-05-21 (×3): 75 mg via ORAL
  Filled 2016-05-19 (×3): qty 1

## 2016-05-19 MED ORDER — STROKE: EARLY STAGES OF RECOVERY BOOK
Freq: Once | Status: AC
  Administered 2016-05-19: 07:00:00

## 2016-05-19 MED ORDER — INSULIN ASPART 100 UNIT/ML ~~LOC~~ SOLN
0.0000 [IU] | Freq: Three times a day (TID) | SUBCUTANEOUS | Status: DC
  Administered 2016-05-19: 8 [IU] via SUBCUTANEOUS
  Administered 2016-05-19: 5 [IU] via SUBCUTANEOUS
  Administered 2016-05-19: 8 [IU] via SUBCUTANEOUS
  Administered 2016-05-20 – 2016-05-21 (×5): 5 [IU] via SUBCUTANEOUS

## 2016-05-19 MED ORDER — LISINOPRIL 20 MG PO TABS: 40.0000 mg | ORAL_TABLET | Freq: Every day | ORAL | Status: DC

## 2016-05-19 MED ORDER — ASPIRIN 325 MG PO TABS
325.0000 mg | ORAL_TABLET | Freq: Every day | ORAL | Status: DC
  Administered 2016-05-19 – 2016-05-21 (×3): 325 mg via ORAL
  Filled 2016-05-19 (×3): qty 1

## 2016-05-19 MED ORDER — LISINOPRIL 20 MG PO TABS
20.0000 mg | ORAL_TABLET | Freq: Two times a day (BID) | ORAL | Status: DC
  Administered 2016-05-20 (×2): 20 mg via ORAL
  Filled 2016-05-19 (×2): qty 1

## 2016-05-19 MED ORDER — ATORVASTATIN CALCIUM 80 MG PO TABS
80.0000 mg | ORAL_TABLET | Freq: Every day | ORAL | Status: DC
  Administered 2016-05-19 – 2016-05-20 (×2): 80 mg via ORAL
  Filled 2016-05-19 (×2): qty 1

## 2016-05-19 MED ORDER — INSULIN GLARGINE 100 UNIT/ML ~~LOC~~ SOLN
10.0000 [IU] | Freq: Every day | SUBCUTANEOUS | Status: DC
  Administered 2016-05-20 (×2): 10 [IU] via SUBCUTANEOUS
  Filled 2016-05-19 (×3): qty 0.1

## 2016-05-19 NOTE — ED Notes (Signed)
Pt passed bedside swallow eval.  Pt has no neurological deficits.  Pt ambulatory without difficulty.  Pt in sinus rhythm on the cardiac monitor.  Pt's left 18g PIV remains patent at this time.  Pt in no apparent distress at this time.

## 2016-05-19 NOTE — Evaluation (Signed)
Physical Therapy Evaluation Patient Details Name: Saranya H Guido MRSHAZIA MITCHENER5 DOB: 09-22-1966 Today's Date: 05/19/2016   History of Present Illness  Patient is a 50 y/o female with hx of HTN, DM, CVA presents with memory difficulties. MRI- New area of acute infarction, medial RIGHT hemisphere affecting anterior-most thalamus as well as the genu of the internal capsule.   Clinical Impression  Patient presents with memory deficits, worsened from baseline per daughter, impaired ability to dual task as evidenced by increased time during the manual and cognitive TUG. Pt with no awareness of deficits. Pt safely able to ambulate and negotiate stairs without LOB or difficulty. Pt does not require skilled therapy services. Discharge from therapy. Recommend cognitive evaluation.    Follow Up Recommendations No PT follow up    Equipment Recommendations  None recommended by PT    Recommendations for Other Services Speech consult (cognitive eval)     Precautions / Restrictions Precautions Precautions: None Restrictions Weight Bearing Restrictions: No      Mobility  Bed Mobility Overal bed mobility: Independent                Transfers Overall transfer level: Independent                  Ambulation/Gait Ambulation/Gait assistance: Independent Ambulation Distance (Feet): 400 Feet Assistive device: None Gait Pattern/deviations: WFL(Within Functional Limits)   Gait velocity interpretation: at or above normal speed for age/gender General Gait Details: Steady gait. Able to tolerate higher level balance without difficulty.   Stairs Stairs: Yes Stairs assistance: Modified independent (Device/Increase time) Stair Management: One rail Right Number of Stairs: 13 General stair comments: Safe technique.  Wheelchair Mobility    Modified Rankin (Stroke Patients Only) Modified Rankin (Stroke Patients Only) Pre-Morbid Rankin Score: No symptoms Modified Rankin: No significant  disability     Balance Overall balance assessment: Needs assistance Sitting-balance support: Feet supported;No upper extremity supported Sitting balance-Leahy Scale: Normal     Standing balance support: During functional activity Standing balance-Leahy Scale: Good               High level balance activites: Direction changes;Turns;Sudden stops;Head turns High Level Balance Comments: Tolerated above without LOB or difficulty. Able to perform 360 degree turn without difficulty. Standardized Balance Assessment Standardized Balance Assessment : TUG: Timed Up and Go Test     Timed Up and Go Test TUG: Cognitive TUG Manual TUG (seconds): 13.59 (pouring water back and forth between cups, no spilling) Cognitive TUG (seconds): 26 (not able to correctly count backwards from 100 by 3s .)     Pertinent Vitals/Pain Pain Assessment: No/denies pain    Home Living Family/patient expects to be discharged to:: Private residence Living Arrangements: Spouse/significant other;Children Available Help at Discharge: Family;Friend(s);Available PRN/intermittently Type of Home: House Home Access: Stairs to enter Entrance Stairs-Rails: Doctor, general practice of Steps: 8 Home Layout: One level Home Equipment: None Additional Comments: daughter is Consulting civil engineer in [place], spouse works during the day.     Prior Function Level of Independence: Independent         Comments: hairdresser     Hand Dominance   Dominant Hand: Right    Extremity/Trunk Assessment   Upper Extremity Assessment: Overall WFL for tasks assessed           Lower Extremity Assessment: Overall WFL for tasks assessed      Cervical / Trunk Assessment: Normal  Communication   Communication: No difficulties  Cognition Arousal/Alertness: Awake/alert Behavior During Therapy: WFL for tasks  assessed/performed Overall Cognitive Status: Impaired/Different from baseline Area of Impairment: Problem  solving;Memory;Orientation Orientation Level: Disoriented to;Time   Memory: Decreased short-term memory       Problem Solving: Slow processing General Comments: Pt not able to recall 3 items at end of session when told them in beginning of session.    General Comments General comments (skin integrity, edema, etc.): Able to name 5 desserts within 29 seconds.    Exercises        Assessment/Plan    PT Assessment Patent does not need any further PT services  PT Diagnosis Difficulty walking   PT Problem List    PT Treatment Interventions     PT Goals (Current goals can be found in the Care Plan section) Acute Rehab PT Goals Patient Stated Goal: to go home PT Goal Formulation: All assessment and education complete, DC therapy    Frequency     Barriers to discharge        Co-evaluation               End of Session Equipment Utilized During Treatment: Gait belt Activity Tolerance: Patient tolerated treatment well Patient left: in bed;with call bell/phone within reach;with family/visitor present Nurse Communication: Mobility status         Time: 1245-8099 PT Time Calculation (min) (ACUTE ONLY): 19 min   Charges:   PT Evaluation $PT Eval Low Complexity: 1 Procedure     PT G Codes:        Donita Newland A Jennah Satchell 05/19/2016, 11:50 AM Mylo Red, PT, DPT [phone]

## 2016-05-19 NOTE — Progress Notes (Signed)
PROGRESS NOTE    Annette Munoz  HEK:[id number] DOB: 08-07-1966 DOA: 05/18/2016 PCP: Annette Munoz    Brief Narrative:  50 y.o. female with a past medical history significant for NIDDM, HTN and stroke last fall who presents with new acute forgetfulness.   An MRI/MRA of the head and neck showed similar pattern of atherosclerotic disease in the left vasculature, and a new 1 cm right thalamic stroke  Assessment & Plan:   Principal Problem:   CVA (cerebral infarction)/  Acute ischemic stroke [facility]) - Neurology on board and patient currently undergoing work up - continue aspirin and plavix  Active Problems:   Pure hypercholesterolemia - stable on lipitor.    Essential hypertension - allow for permissive hypertension    Uncontrolled type 2 diabetes mellitus with circulatory disorder, without long-term current use of insulin (HCC)- not well controlled currently - stable continue lantus and SSI - diabetic diet  DVT prophylaxis: lovenox Code Status: Full Family Communication: spoke with patient and family at bedside.  Disposition Plan: pending work up   Consultants:   Neurology   Procedures: Stroke work up   Antimicrobials: None   Subjective: Pt has no new complaints, no acute issues overnight.  Objective: Vitals:   05/19/16 0600 05/19/16 0800 05/19/16 1045 05/19/16 1200  BP: (!) 162/100 (!) 178/89 (!) 186/86 (!) 197/86  Pulse: 75 84 66   Resp: 18 18 20 18   Temp: 98.9 F (37.2 C) 98.2 F (36.8 C) 98.7 F (37.1 C) 97.8 F (36.6 C)  TempSrc: Oral Oral Oral Oral  SpO2: 98% 99% 96% 99%  Weight:      Height:        Intake/Output Summary (Last 24 hours) at 05/19/16 1606 Last data filed at 05/19/16 0600  Gross per 24 hour  Intake              440 ml  Output              700 ml  Net             -260 ml   Filed Weights   05/19/16 0245  Weight: 103.8 kg (228 lb 12.8 oz)    Examination:  General exam: Appears calm and comfortable  Respiratory system:  Clear to auscultation. Respiratory effort normal. Cardiovascular system: No cyanosis, no edema Gastrointestinal system: Abdomen is nondistended, soft and nontender. No organomegaly or masses felt. Normal bowel sounds heard. Central nervous system: Alert and oriented. Answers questions appropriately Extremities: Symmetric 5 x 5 power. Skin: No rashes, lesions or ulcers Psychiatry: Judgement and insight appear normal. Mood & affect appropriate.     Data Reviewed: I have personally reviewed following labs and imaging studies  CBC:  Recent Labs Lab 05/18/16 1434  WBC 7.6  HGB 13.0  HCT 38.1  MCV 91.4  PLT 288   Basic Metabolic Panel:  Recent Labs Lab 05/18/16 1434  NA 134*  K 4.2  CL 104  CO2 19*  GLUCOSE 287*  BUN 16  CREATININE 0.75  CALCIUM 9.2   GFR: Estimated Creatinine Clearance: 96.2 mL/min (by C-G formula based on SCr of 0.8 mg/dL). Liver Function Tests:  Recent Labs Lab 05/18/16 1434  AST 18  ALT 20  ALKPHOS 67  BILITOT 1.2  PROT 7.0  ALBUMIN 3.9   No results for input(s): LIPASE, AMYLASE in the last 168 hours. No results for input(s): AMMONIA in the last 168 hours. Coagulation Profile: No results for input(s): INR, PROTIME in the last 168 hours.  Cardiac Enzymes: No results for input(s): CKTOTAL, CKMB, CKMBINDEX, TROPONINI in the last 168 hours. BNP (last 3 results) No results for input(s): PROBNP in the last 8760 hours. HbA1C: No results for input(s): HGBA1C in the last 72 hours. CBG:  Recent Labs Lab 05/19/16 0728 05/19/16 1110  GLUCAP 282* 217*   Lipid Profile:  Recent Labs  05/19/16 0509  CHOL 133  HDL 32*  LDLCALC 83  TRIG 89  CHOLHDL 4.2   Thyroid Function Tests: No results for input(s): TSH, T4TOTAL, FREET4, T3FREE, THYROIDAB in the last 72 hours. Anemia Panel: No results for input(s): VITAMINB12, FOLATE, FERRITIN, TIBC, IRON, RETICCTPCT in the last 72 hours. Sepsis Labs: No results for input(s): PROCALCITON,  LATICACIDVEN in the last 168 hours.  Recent Results (from the past 240 hour(s))  Urine culture     Status: Abnormal   Collection Time: 05/18/16  4:49 PM  Result Value Ref Range Status   Specimen Description URINE, RANDOM  Final   Special Requests NONE  Final   Culture MULTIPLE SPECIES PRESENT, SUGGEST RECOLLECTION (A)  Final   Report Status 05/19/2016 FINAL  Final         Radiology Studies: Mr Shirlee Latch ZO Contrast  Result Date: 05/18/2016 CLINICAL DATA:  Confusion for 2 days. No other focal neuro deficits. EXAM: MRI HEAD WITHOUT CONTRAST MRA HEAD WITHOUT CONTRAST TECHNIQUE: Multiplanar, multiecho pulse sequences of the brain and surrounding structures were obtained without intravenous contrast. Angiographic images of the head were obtained using MRA technique without contrast. COMPARISON:  CT angiography of the head 08/10/2015. MRI and MRA 08/07/2015. FINDINGS: MRI HEAD FINDINGS Since the previous study, there is a new area of acute infarction, 1 x 1 x 1.5 cm, involving the RIGHT hemisphere medial and anterior most thalamus, as well as the genu of the RIGHT internal capsule displaying restricted diffusion. No other areas of acute infarction are seen. No mass lesion, hydrocephalus, acute hemorrhage, or extra-axial fluid. Mild atrophy. Minor white matter disease. Chronic LEFT ACA territory infarcts involving the corpus callosum anterior to the frontal horn, and LEFT cingulate gyrus. No midline abnormality. Flow voids are maintained. Extracranial soft tissues unremarkable. MRA HEAD FINDINGS Both internal carotid arteries are patent, with no significant irregularity on the RIGHT, and only mild irregularity on the LEFT, possible 50% cavernous and supraclinoid stenosis. ICA termini bilaterally are widely patent. On the RIGHT, the fifth A1 and M1 segments are widely patent on the LEFT, there is mild irregularity of the proximal M1 segment with a 75% stenosis of the proximal LEFT A1. In the LEFT anterior  cerebral artery there is severe disease distal to the callosum marginal branch. On the RIGHT there are severe tandem stenoses in the A2 segments. No MCA branch occlusion. In the posterior circulation, the basilar is widely patent with LEFT vertebral dominant. The RIGHT vertebral is patent and contributes primarily to the RIGHT calf. Appear moderately diseased in their P2 segments, greater on the RIGHT. No definite cerebellar branch occlusion. No intracranial aneurysm. IMPRESSION: New area of acute infarction, medial RIGHT hemisphere affecting anterior-most thalamus as well as the genu of the internal capsule. It is unclear if this involves the anterior choroidal artery territory, or medial thalamoperforate territory. MCA lenticulostriate territory involvement is unlikely. Chronic changes as described, with resolved predominantly left-sided infarcts in 2016. Similar pattern of intracranial atherosclerosis when compared with prior CTA from 08/10/2015. Electronically Signed   By: Elsie Stain M.D.   On: 05/18/2016 19:44   Mr Brain Wo Contrast (neuro  Protocol)  Result Date: 05/18/2016 CLINICAL DATA:  Confusion for 2 days. No other focal neuro deficits. EXAM: MRI HEAD WITHOUT CONTRAST MRA HEAD WITHOUT CONTRAST TECHNIQUE: Multiplanar, multiecho pulse sequences of the brain and surrounding structures were obtained without intravenous contrast. Angiographic images of the head were obtained using MRA technique without contrast. COMPARISON:  CT angiography of the head 08/10/2015. MRI and MRA 08/07/2015. FINDINGS: MRI HEAD FINDINGS Since the previous study, there is a new area of acute infarction, 1 x 1 x 1.5 cm, involving the RIGHT hemisphere medial and anterior most thalamus, as well as the genu of the RIGHT internal capsule displaying restricted diffusion. No other areas of acute infarction are seen. No mass lesion, hydrocephalus, acute hemorrhage, or extra-axial fluid. Mild atrophy. Minor white matter disease. Chronic  LEFT ACA territory infarcts involving the corpus callosum anterior to the frontal horn, and LEFT cingulate gyrus. No midline abnormality. Flow voids are maintained. Extracranial soft tissues unremarkable. MRA HEAD FINDINGS Both internal carotid arteries are patent, with no significant irregularity on the RIGHT, and only mild irregularity on the LEFT, possible 50% cavernous and supraclinoid stenosis. ICA termini bilaterally are widely patent. On the RIGHT, the fifth A1 and M1 segments are widely patent on the LEFT, there is mild irregularity of the proximal M1 segment with a 75% stenosis of the proximal LEFT A1. In the LEFT anterior cerebral artery there is severe disease distal to the callosum marginal branch. On the RIGHT there are severe tandem stenoses in the A2 segments. No MCA branch occlusion. In the posterior circulation, the basilar is widely patent with LEFT vertebral dominant. The RIGHT vertebral is patent and contributes primarily to the RIGHT calf. Appear moderately diseased in their P2 segments, greater on the RIGHT. No definite cerebellar branch occlusion. No intracranial aneurysm. IMPRESSION: New area of acute infarction, medial RIGHT hemisphere affecting anterior-most thalamus as well as the genu of the internal capsule. It is unclear if this involves the anterior choroidal artery territory, or medial thalamoperforate territory. MCA lenticulostriate territory involvement is unlikely. Chronic changes as described, with resolved predominantly left-sided infarcts in 2016. Similar pattern of intracranial atherosclerosis when compared with prior CTA from 08/10/2015. Electronically Signed   By: Elsie Stain M.D.   On: 05/18/2016 19:44        Scheduled Meds: . aspirin  325 mg Oral Daily  . atorvastatin  80 mg Oral q1800  . clopidogrel  75 mg Oral Daily  . enoxaparin (LOVENOX) injection  40 mg Subcutaneous Q24H  . insulin aspart  0-15 Units Subcutaneous TID WC  . insulin glargine  10 Units  Subcutaneous QHS   Continuous Infusions:    LOS: 1 day    Time spent: > 35 minutes   Penny Pia, MD [facility] Pager [phone]  If 7PM-7AM, please contact night-coverage [email or web address] Password [email or web address] 05/19/2016, 4:06 PM

## 2016-05-19 NOTE — Evaluation (Signed)
SLP Cancellation Note  Patient Details Name: Annette Munoz MRN: [id number] DOB: [birth year]   Cancelled treatment:       Reason Eval/Treat Not Completed: Other (comment);Patient at procedure or test/unavailable   Donavan Burnet, MS [facility] SLP [phone]

## 2016-05-19 NOTE — Progress Notes (Signed)
  Echocardiogram 2D Echocardiogram has been performed.  Cathie Beams 05/19/2016, 9:56 AM

## 2016-05-19 NOTE — ED Notes (Signed)
Patient is resting comfortably.  Family present at the bedside at this time. Will continue to closely monitor pt.

## 2016-05-19 NOTE — Evaluation (Signed)
Speech Language Pathology Evaluation Patient Details Name: Annette Munoz MRN: [id number] DOB: Apr 08, 1966 Today's Date: 05/19/2016 Time: 1401-1430 SLP Time Calculation (min) (ACUTE ONLY): 29 min  Problem List:  Patient Active Problem List   Diagnosis Date Noted  . Acute ischemic stroke (HCC) 05/18/2016  . Obesity 09/30/2015  . Uncontrolled type 2 diabetes mellitus with circulatory disorder, without long-term current use of insulin (HCC)   . Essential hypertension   . Cerebrovascular accident (CVA) due to thrombosis of cerebral artery (HCC)   . CVA (cerebral infarction) 08/07/2015  . Pure hypercholesterolemia 10/04/2009   Past Medical History:  Past Medical History:  Diagnosis Date  . Diabetes mellitus without complication (HCC)   . Hypertension   . Stroke [facility])    Past Surgical History:  Past Surgical History:  Procedure Laterality Date  . CESAREAN SECTION    . TEE WITHOUT CARDIOVERSION N/A 08/10/2015   Procedure: TRANSESOPHAGEAL ECHOCARDIOGRAM (TEE);  Surgeon: Annette Nose, MD;  Location: [facility] ENDOSCOPY;  Service: Cardiovascular;  Laterality: N/A;   HPI:  50 y.o. female with a past medical history significant for NIDDM, HTN and stroke last fall who presents with new acute forgetfulness; MRI on 05/18/16 indicated New area of acute infarction, medial RIGHT hemisphere affecting anterior-most thalamus as well as the genu of the internal capsule; pt was seen for ST in Outpatient setting for previous CVA until March 2017; pt could not recall receiving ST and her husband stated "it didn't help." Notes from prior SLP noted decreased follow-through with strategies given for recall.   Assessment / Plan / Recommendation Clinical Impression   Pt administered MOCA (Montreal Cognitive Assessment) with a score of 16/30 indicating overall deficits in visuospatial/executive functioning, naming, memory, attention and orientation; pt could not recall words after time delay 0/5 and only 1/5 with cues  provided by SLP; 2/5 with multiple choice; word fluency decreased with time constraint, oriented to self and time, but not to place or situation.  Pt named 2/3 items correctly without verbal cueing from SLP from pictures and needed visual/verbal cueing to complete visuospatial tasks requiring sustained attention. Daughter utilizing apps/reminders on phone for Mother to increase awareness/safety at home. Pt exhibited decreased awareness/judgment as well throughout evaluation; concerns for safety of pt d/t multiple cognitive deficits at this time; ST f/u while in hospital and recommend OP or HH ST to improve cognitive functioning/safety.     SLP Assessment  Patient needs continued Speech Language Pathology Services    Follow Up Recommendations  Home health SLP;Outpatient SLP;Other (comment) (if family agrees; questionable during evaluation)    Frequency and Duration min 2x/week  1 week      SLP Evaluation Prior Functioning  Cognitive/Linguistic Baseline: Baseline deficits Baseline deficit details: decreased recall, STM, cognition including orientation, executive functioning; previously seen in OP clinic; d/c  d/t poor follow-through Type of Home: House  Lives With: Spouse;Family Available Help at Discharge: Family;Friend(s);Available PRN/intermittently Education:  (HS education; cosmetology school) Vocation: Full time employment   Cognition  Overall Cognitive Status: Impaired/Different from baseline Arousal/Alertness: Awake/alert Orientation Level: Oriented to person;Disoriented to place;Disoriented to time;Disoriented to situation Attention: Sustained Sustained Attention: Impaired Sustained Attention Impairment: Verbal complex;Functional complex Memory: Impaired Memory Impairment: Retrieval deficit;Decreased recall of new information;Decreased short term memory Decreased Short Term Memory: Verbal basic;Functional basic Executive Function: Organizing;Decision Making Organizing:  Impaired Organizing Impairment: Verbal complex;Functional complex Decision Making: Impaired Decision Making Impairment: Verbal complex;Functional complex Behaviors: Restless;Perseveration Safety/Judgment: Impaired    Comprehension  Auditory Comprehension Overall Auditory Comprehension: Appears within  functional limits for tasks assessed Yes/No Questions: Within Functional Limits Commands: Within Functional Limits Conversation: Complex Visual Recognition/Discrimination Discrimination: Within Function Limits Reading Comprehension Reading Status: Unable to assess (comment)    Expression Expression Primary Mode of Expression: Verbal Verbal Expression Overall Verbal Expression: Impaired Initiation: No impairment Level of Generative/Spontaneous Verbalization: Conversation Repetition: Impaired Level of Impairment: Sentence level Naming: Impairment Responsive: 26-50% accurate Confrontation: Impaired Convergent: 50-74% accurate Divergent: 25-49% accurate Non-Verbal Means of Communication: Not applicable Written Expression Dominant Hand: Right Written Expression: Within Functional Limits (for simple tasks)   Oral / Motor  Oral Motor/Sensory Function Overall Oral Motor/Sensory Function: Within functional limits Motor Speech Overall Motor Speech: Appears within functional limits for tasks assessed Respiration: Within functional limits Phonation: Normal Resonance: Within functional limits Articulation: Within functional limitis Intelligibility: Intelligible Motor Planning: Witnin functional limits Motor Speech Errors: Not applicable Interfering Components: Premorbid status                       Annette Munoz,PAT, M.S., CCC-SLP 05/19/2016, 2:45 PM

## 2016-05-19 NOTE — Progress Notes (Signed)
PT Cancellation Note  Patient Details Name: DAJAH BAZZLE MRN: [id number] DOB: 1966-03-22   Cancelled Treatment:    Reason Eval/Treat Not Completed: Patient at procedure or test/unavailable Pt off floor for test- ECHO. Will follow up as time allows.   Blake Divine A Sherril Heyward 05/19/2016, 9:06 AM  Mylo Red, PT, DPT [phone]

## 2016-05-19 NOTE — Evaluation (Signed)
Occupational Therapy Evaluation Patient Details Name: Annette Munoz MRN: [id number] DOB: 1965/12/14 Today's Date: 05/19/2016    History of Present Illness Patient is a 50 y/o female with hx of HTN, DM, CVA presents with memory difficulties. MRI- New area of acute infarction, medial RIGHT hemisphere affecting anterior-most thalamus as well as the genu of the internal capsule.    Clinical Impression   Patient evaluated by Occupational Therapy with no further acute OT needs identified. All education has been completed and the patient has no further questions. See below for any follow-up Occupational Therapy or equipment needs. OT to sign off. Thank you for referral. Recommend SLP for follow up due to cognitive deficits.      Follow Up Recommendations  No OT follow up    Equipment Recommendations  None recommended by OT    Recommendations for Other Services Other (comment) (SLP for cognition)     Precautions / Restrictions Precautions Precautions: None Restrictions Weight Bearing Restrictions: No      Mobility Bed Mobility Overal bed mobility: Modified Independent                Transfers Overall transfer level: Independent                    Balance Overall balance assessment: Needs assistance Sitting-balance support: Feet supported;No upper extremity supported Sitting balance-Leahy Scale: Normal     Standing balance support: During functional activity                      ADL Overall ADL's : Modified independent                                             Vision     Perception     Praxis      Pertinent Vitals/Pain Pain Assessment: No/denies pain     Hand Dominance Right   Extremity/Trunk Assessment Upper Extremity Assessment Upper Extremity Assessment: Overall WFL for tasks assessed   Lower Extremity Assessment Lower Extremity Assessment: Defer to PT evaluation   Cervical / Trunk Assessment Cervical /  Trunk Assessment: Normal   Communication Communication Communication: No difficulties   Cognition Arousal/Alertness: Awake/alert Behavior During Therapy: WFL for tasks assessed/performed Overall Cognitive Status: Impaired/Different from baseline Area of Impairment: Attention;Memory;Following commands;Awareness;Safety/judgement;Orientation Orientation Level: Disoriented to;Place Current Attention Level: Sustained Memory: Decreased short-term memory Following Commands: Follows multi-step commands inconsistently Safety/Judgement: Decreased awareness of safety;Decreased awareness of deficits Awareness: Intellectual Problem Solving: Slow processing General Comments: Pt provided :MOCA= 13/30 (visuospatial executive function/ memory/ language, attention/ delayed recall / orientation) Uses a hair dresser book for scheduled patients ( sees 3-4 per day )  Currently with poor recall but now unable to recall taking blood sugar Educated on use of IPHONE alarm to alert to blood sugars/ medical id to help in case of emergency since second admission in less than year. 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      General Comments       Exercises       Shoulder Instructions      Home Living Family/patient expects to be discharged to:: Private residence Living Arrangements: Spouse/significant other;Children Available Help at Discharge: Family;Friend(s);Available PRN/intermittently Type of Home: House Home Access: Stairs to enter [employer] of Steps: 8 Entrance Stairs-Rails: Right;Left Home Layout: One  level     Bathroom Shower/Tub: Chief Strategy Officer: Standard     Home Equipment: None   Additional Comments: daughter is Consulting civil engineer in [place], spouse works during the day. 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)      Prior Functioning/Environment Level of  Independence: Independent        Comments: drives, hairdresser    OT Diagnosis:     OT Problem List:     OT Treatment/Interventions:      OT Goals(Current goals can be found in the care plan section) Acute Rehab OT Goals Patient Stated Goal: to go home  OT Frequency:     Barriers to D/C:            Co-evaluation              End of Session Equipment Utilized During Treatment: Gait belt Nurse Communication: Mobility status;Precautions  Activity Tolerance: Patient tolerated treatment well Patient left: in bed;with call bell/phone within reach;with family/visitor present (sitting EOB for lunch)   Time: 1610-9604 OT Time Calculation (min): 31 min Charges:  OT General Charges $OT Visit: 1 Procedure OT Evaluation $OT Eval Moderate Complexity: 1 Procedure OT Treatments $Cognitive Skills Development: 8-22 mins G-Codes:    Boone Master B 06-04-2016, 2:34 PM  Mateo Flow   OTR/L Pager: [phone] Office: [phone] .

## 2016-05-19 NOTE — Progress Notes (Signed)
STROKE TEAM PROGRESS NOTE   HISTORY OF PRESENT ILLNESS (per record) Annette Munoz is a 50 y.o. female with a history of previous stroke, hypertension, diabetes who presents with memory loss for the past couple of days. Her daughter states that she was in her normal self on Monday, however on Tuesday she began having issues with her memory. Her daughter noticed that she didn't seem to be remembering things that she clearly done. She was able to continue to navigate her daily activities, with the exception of the daughter feels that she had to help her more with cooking than she typically does. She even was able to work on Tuesday, though it is not clear if she performed up to her typical standard. She was LKW 05/16/2016. Patient was not administered IV t-PA secondary to being out of window. She was admitted for further evaluation and treatment.   SUBJECTIVE (INTERVAL HISTORY) Her daughter is at the bedside.  Patient up in bed eating lunch. Per daughter, she is forgetful some, hard to make her be compliant. The patient overall feels her condition is unchanged. She denies any issues.   OBJECTIVE Temp:  [97.9 F (36.6 C)-99.1 F (37.3 C)] 98.7 F (37.1 C) (08/04 1045) Pulse Rate:  [56-91] 66 (08/04 1045) Cardiac Rhythm: Normal sinus rhythm (08/04 0700) Resp:  [10-20] 20 (08/04 1045) BP: (139-209)/(60-146) 186/86 (08/04 1045) SpO2:  [96 %-100 %] 96 % (08/04 1045) Weight:  [103.8 kg (228 lb 12.8 oz)] 103.8 kg (228 lb 12.8 oz) (08/04 0245)  CBC:   Recent Labs Lab 05/18/16 1434  WBC 7.6  HGB 13.0  HCT 38.1  MCV 91.4  PLT 288    Basic Metabolic Panel:   Recent Labs Lab 05/18/16 1434  NA 134*  K 4.2  CL 104  CO2 19*  GLUCOSE 287*  BUN 16  CREATININE 0.75  CALCIUM 9.2    Lipid Panel:     Component Value Date/Time   CHOL 133 05/19/2016 0509   TRIG 89 05/19/2016 0509   HDL 32 (L) 05/19/2016 0509   CHOLHDL 4.2 05/19/2016 0509   VLDL 18 05/19/2016 0509   LDLCALC 83 05/19/2016  0509   HgbA1c:  Lab Results  Component Value Date   HGBA1C 7.2 (H) 08/08/2015   Urine Drug Screen:     Component Value Date/Time   LABOPIA NONE DETECTED 05/19/2016 0746   COCAINSCRNUR NONE DETECTED 05/19/2016 0746   LABBENZ NONE DETECTED 05/19/2016 0746   AMPHETMU NONE DETECTED 05/19/2016 0746   THCU NONE DETECTED 05/19/2016 0746   LABBARB NONE DETECTED 05/19/2016 0746      IMAGING I have personally reviewed the radiological images below and agree with the radiology interpretations.  Mri & Mra Brain Wo Contrast (neuro Protocol) 05/18/2016 New area of acute infarction, medial RIGHT hemisphere affecting anterior-most thalamus as well as the genu of the internal capsule. It is unclear if this involves the anterior choroidal artery territory, or medial thalamoperforate territory. MCA lenticulostriate territory involvement is unlikely. Chronic changes as described, with resolved predominantly left-sided infarcts in 2016. Similar pattern of intracranial atherosclerosis when compared with prior CTA from 08/10/2015.   Carotid Doppler   There is 1-39% bilateral ICA stenosis. Vertebral artery flow is antegrade.    2D Echocardiogram  - Left ventricle: The cavity size was normal. Wall thickness was increased in a pattern of moderate LVH. There was mild concentric hypertrophy. Systolic function was normal. The estimated ejection fraction was in the range of 60% to 65%. Wall motion  was normal; there were no regional wall motion abnormalities. Doppler parameters are consistent with abnormal left ventricular relaxation (grade 1 diastolic dysfunction). The E/e&' ratio is between 8-15, suggesting indeterminate LV filling pressure. - Left atrium: Moderately dilated. - Right atrium: The atrium was at the upper limits of normal in size. - Inferior vena cava: The vessel was normal in size. The respirophasic diameter changes were in the normal range (>= 50%), consistent with normal central venous  pressure. Impressions:    Compared to a prior study in 2016, there is now mild LVH, LVEF 60-65%, diastolic dysfunction and moderate left atrial enlargement.   PHYSICAL EXAM  Temp:  [97.8 F (36.6 C)-99.1 F (37.3 C)] 98.2 F (36.8 C) (08/04 1400) Pulse Rate:  [56-107] 107 (08/04 1400) Resp:  [10-20] 18 (08/04 1400) BP: (139-209)/(60-146) 157/75 (08/04 1400) SpO2:  [96 %-100 %] 97 % (08/04 1400) Weight:  [228 lb 12.8 oz (103.8 kg)] 228 lb 12.8 oz (103.8 kg) (08/04 0245)  General - Well nourished, well developed, in no apparent distress.  Ophthalmologic - Sharp disc margins OU.   Cardiovascular - Regular rate and rhythm.  Mental Status -  Level of arousal and orientation to time, place, and person were intact. Language including expression, naming, repetition, comprehension was assessed and found intact. Fund of Knowledge was assessed and was intact.  Cranial Nerves II - XII - II - Visual field intact OU. III, IV, VI - Extraocular movements intact. V - Facial sensation intact bilaterally. VII - Facial movement intact bilaterally. VIII - Hearing & vestibular intact bilaterally. X - Palate elevates symmetrically. XI - Chin turning & shoulder shrug intact bilaterally. XII - Tongue protrusion intact.  Motor Strength - The patient's strength was normal in all extremities and pronator drift was absent.  Bulk was normal and fasciculations were absent.   Motor Tone - Muscle tone was assessed at the neck and appendages and was normal.  Reflexes - The patient's reflexes were 1+ in all extremities and she had no pathological reflexes.  Sensory - Light touch, temperature/pinprick, vibration and proprioception, and Romberg testing were assessed and were symmetrical.    Coordination - The patient had normal movements in the hands and feet with no ataxia or dysmetria.  Tremor was absent.  Gait and Station - The patient's transfers, posture, gait, station, and turns were observed as  normal.   ASSESSMENT/PLAN [name] is a 50 y.o. female with history of  NIDDM, HTN, stroke and difficulty with complaince presenting with worsening confusion. She did not receive IV t-PA due to delay in arrival.   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 Old L brain infarcts.  MRA  Unchanged from CTA 08/10/2015  Carotid Doppler  No significant stenosis   2D Echo  EF 60-65%. No source of embolus   LDL 83  HgbA1c pending  Lovenox 40 mg sq daily for VTE prophylaxis Diet Carb Modified Fluid consistency: Thin; Room service appropriate? Yes  aspirin 81 mg daily prior to admission, now on aspirin 325 mg daily. Recommend DAPT with ASA 325mg  and plavix 75mg  for 3 months then plavix along for further stroke prevention due to intracranial stenosis.  Patient counseled to be compliant with her antithrombotic medications  Ongoing aggressive stroke risk factor management  Therapy recommendations:  No PT  Disposition:  pending   Hypertension  Elevated - as high as 209/103  Permissive hypertension (OK if <  220/120) but gradually normalize in 5-7 days  Resume lisinopril 20mg  bid  Long-term BP goal normotensive  Hyperlipidemia  Home meds:  lipitor 80, resumed in hospital  LDL 83, goal < 70  Continue statin at discharge  Diabetes  HgbA1c pending, goal < 7.0  Uncontrolled  DM education needed  Other Stroke Risk Factors  Morbid Obesity, Body mass index is 41.85 kg/m., recommend weight loss, diet and exercise as appropriate   Hx stroke/TIA  07/2015  left ACA and right hypothalamus, embolic, etiology unknown  Other Active Problems  Abnormal UA - 6-30 WBC and many bacteria. UCx pending   Hospital day # 1  Marvel Plan, MD PhD Stroke Neurology 05/19/2016 4:32 PM     To contact Stroke Continuity provider, please refer to [email or web address]. After hours, contact  General Neurology

## 2016-05-19 NOTE — Progress Notes (Signed)
**  Preliminary report by tech**  Findings consistent with a 1-39 percent stenosis involving the left common carotid artery. Bilateral vertebral arteries demonstrate antegrade flow.  05/19/16 11:08 AM Olen Cordial RVT

## 2016-05-20 DIAGNOSIS — R410 Disorientation, unspecified: Secondary | ICD-10-CM

## 2016-05-20 LAB — GLUCOSE, CAPILLARY
GLUCOSE-CAPILLARY: 255 mg/dL — AB (ref 65–99)
GLUCOSE-CAPILLARY: 206 mg/dL — AB (ref 65–99)
Glucose-Capillary: 242 mg/dL — ABNORMAL HIGH (ref 65–99)
Glucose-Capillary: 184 mg/dL — ABNORMAL HIGH (ref 65–99)

## 2016-05-20 LAB — HEMOGLOBIN A1C
Hgb A1c MFr Bld: 11.1 % — ABNORMAL HIGH (ref 4.8–5.6)
Mean Plasma Glucose: 272 mg/dL

## 2016-05-20 NOTE — Progress Notes (Signed)
STROKE TEAM PROGRESS NOTE   SUBJECTIVE (INTERVAL HISTORY) No family is at the bedside. She denies any issues. Her A1C is 11.1 and DM not in control.    OBJECTIVE Temp:  [97.8 F (36.6 C)-99.1 F (37.3 C)] 98.5 F (36.9 C) (08/05 0922) Pulse Rate:  [58-107] 62 (08/05 0922) Cardiac Rhythm: Normal sinus rhythm (08/05 0700) Resp:  [16-18] 18 (08/05 0922) BP: (142-197)/(58-86) 142/80 (08/05 0922) SpO2:  [96 %-99 %] 97 % (08/05 0922)  CBC:   Recent Labs Lab 05/18/16 1434  WBC 7.6  HGB 13.0  HCT 38.1  MCV 91.4  PLT 288    Basic Metabolic Panel:   Recent Labs Lab 05/18/16 1434  NA 134*  K 4.2  CL 104  CO2 19*  GLUCOSE 287*  BUN 16  CREATININE 0.75  CALCIUM 9.2    Lipid Panel:     Component Value Date/Time   CHOL 133 05/19/2016 0509   TRIG 89 05/19/2016 0509   HDL 32 (L) 05/19/2016 0509   CHOLHDL 4.2 05/19/2016 0509   VLDL 18 05/19/2016 0509   LDLCALC 83 05/19/2016 0509   HgbA1c:  Lab Results  Component Value Date   HGBA1C 11.1 (H) 05/19/2016   Urine Drug Screen:     Component Value Date/Time   LABOPIA NONE DETECTED 05/19/2016 0746   COCAINSCRNUR NONE DETECTED 05/19/2016 0746   LABBENZ NONE DETECTED 05/19/2016 0746   AMPHETMU NONE DETECTED 05/19/2016 0746   THCU NONE DETECTED 05/19/2016 0746   LABBARB NONE DETECTED 05/19/2016 0746      IMAGING  I have personally reviewed the radiological images below and agree with the radiology interpretations.  Mri & Mra Brain Wo Contrast (neuro Protocol) 05/18/2016 New area of acute infarction, medial RIGHT hemisphere affecting anterior-most thalamus as well as the genu of the internal capsule. It is unclear if this involves the anterior choroidal artery territory, or medial thalamoperforate territory. MCA lenticulostriate territory involvement is unlikely. Chronic changes as described, with resolved predominantly left-sided infarcts in 2016. Similar pattern of intracranial atherosclerosis when compared with prior  CTA from 08/10/2015.   CTA Head 08/10/2015 1. Infarcts involving the right hypothalamus, anterior genu of corpus callosum on the left, left distal ACA distribution. 2. A high-grade stenosis in proximal right A2 segment and more distal left A3 segment, within the left pericallosal artery. 3. Diffuse attenuation of distal ACA branch vessels, worse on the left. 4. Moderate proximal more mild distal left M1 segment stenoses. 5. Moderate proximal left M2 stenoses beyond the bifurcation. 6. Mild attenuation of distal MCA branch vessels bilaterally. 7. Moderate proximal PCA stenoses bilaterally with significant attenuation of distal small vessels.  Carotid Doppler   There is 1-39% bilateral ICA stenosis. Vertebral artery flow is antegrade.    2D Echocardiogram  - Left ventricle: The cavity size was normal. Wall thickness was increased in a pattern of moderate LVH. There was mild concentric hypertrophy. Systolic function was normal. The estimated ejection fraction was in the range of 60% to 65%. Wall motion was normal; there were no regional wall motion abnormalities. Doppler parameters are consistent with abnormal left ventricular relaxation (grade 1 diastolic dysfunction). The E/e&' ratio is between 8-15, suggesting indeterminate LV filling pressure. - Left atrium: Moderately dilated. - Right atrium: The atrium was at the upper limits of normal in size. - Inferior vena cava: The vessel was normal in size. The respirophasic diameter changes were in the normal range (>= 50%), consistent with normal central venous pressure. Impressions:    Compared  to a prior study in 2016, there is now mild LVH, LVEF 60-65%, diastolic dysfunction and moderate left atrial enlargement.    PHYSICAL EXAM  Temp:  [97.8 F (36.6 C)-99.1 F (37.3 C)] 98.5 F (36.9 C) (08/05 0922) Pulse Rate:  [58-107] 62 (08/05 0922) Resp:  [16-18] 18 (08/05 0922) BP: (142-197)/(58-86) 142/80 (08/05 0922) SpO2:  [96 %-99 %] 97 %  (08/05 0922)  General - Well nourished, well developed, in no apparent distress.  Ophthalmologic - Sharp disc margins OU.   Cardiovascular - Regular rate and rhythm.  Mental Status -  Level of arousal and orientation to time, place, and person were intact. Language including expression, naming, repetition, comprehension was assessed and found intact. Memory testing showed registration 3/3, and delayed recall 0/3. Attention and concentration tests showed able to spell backward but not able to calculate Fund of Knowledge was assessed and was intact.  Cranial Nerves II - XII - II - Visual field intact OU. III, IV, VI - Extraocular movements intact. V - Facial sensation intact bilaterally. VII - Facial movement intact bilaterally. VIII - Hearing & vestibular intact bilaterally. X - Palate elevates symmetrically. XI - Chin turning & shoulder shrug intact bilaterally. XII - Tongue protrusion intact.  Motor Strength - The patient's strength was normal in all extremities and pronator drift was absent.  Bulk was normal and fasciculations were absent.   Motor Tone - Muscle tone was assessed at the neck and appendages and was normal.  Reflexes - The patient's reflexes were 1+ in all extremities and she had no pathological reflexes.  Sensory - Light touch, temperature/pinprick, vibration and proprioception, and Romberg testing were assessed and were symmetrical.    Coordination - The patient had normal movements in the hands and feet with no ataxia or dysmetria.  Tremor was absent.  Gait and Station - The patient's transfers, posture, gait, station, and turns were observed as normal.   ASSESSMENT/PLAN [name] is a 50 y.o. female with history of  NIDDM, HTN, previous stroke and difficulty with complaince presenting with worsening confusion. She did not receive IV t-PA due to delay in arrival.   Stroke:  R thalamic/genu internal capsule infarct, likely embolic secondary to  unknown source  MRI  R thalamic/genu internal capsule infarct. Old L brain infarcts.  MRA  Unchanged from CTA 08/10/2015  Carotid Doppler  No significant stenosis   2D Echo  EF 60-65%. No source of embolus   LDL 83  HgbA1c 11.1  Lovenox 40 mg sq daily for VTE prophylaxis Diet Carb Modified Fluid consistency: Thin; Room service appropriate? Yes  aspirin 81 mg daily prior to admission, now on aspirin 325 mg daily. Recommend DAPT with ASA 325mg  and plavix 75mg  for 3 months then plavix along for further stroke prevention due to intracranial stenosis.  Patient counseled to be compliant with her antithrombotic medications  Ongoing aggressive stroke risk factor management  Therapy recommendations:  No PT  Disposition:  pending   Hypertension  Elevated - as high as 209/103  Permissive hypertension (OK if < 220/120) but gradually normalize in 5-7 days  Resume lisinopril 20mg  bid  Long-term BP goal normotensive  Hyperlipidemia  Home meds:  lipitor 80, resumed in hospital  LDL 83, goal < 70  Continue statin at discharge  Diabetes  HgbA1c 11.1, goal < 7.0  Uncontrolled  DM education needed  Family help medication  Close follow up with PCP and may consider insulin or referral to endocrinologist.  Other Stroke  Risk Factors  Morbid Obesity, Body mass index is 41.85 kg/m., recommend weight loss, diet and exercise as appropriate   Hx stroke/TIA  07/2015  left ACA and right hypothalamus, embolic, etiology unknown  Other Active Problems  Abnormal UA - 6-30 WBC and many bacteria. UCx showed multiple species - recollection recommended   Hospital day # 2  Neurology will sign off. Please call with questions. Pt will follow up with Dr. Roda Shutters at [facility] Monday, already scheduled. Thanks for the consult.  Marvel Plan, MD PhD Stroke Neurology 05/20/2016 11:26 AM     To contact Stroke Continuity provider, please refer to [email or web address]. After hours, contact General Neurology

## 2016-05-20 NOTE — Plan of Care (Signed)
Problem: Food- and Nutrition-Related Knowledge Deficit (NB-1.1) Goal: Nutrition education Formal process to instruct or train a patient/client in a skill or to impart knowledge to help patients/clients voluntarily manage or modify food choices and eating behavior to maintain or improve health. Outcome: Adequate for Discharge  RD consulted for nutrition education regarding diabetes.   Lab Results  Component Value Date   HGBA1C 11.1 (H) 05/19/2016    RD provided "Type 2 diabetes nutrition therapy" and "diabetes label reading tips" handouts from the Academy of [employer]. Additionally, a copy of the My Plate Template for diabetics was given.   RD spoke with pt and her spouse mainly, though other family member was also present.   RD went through a dietary Recall. Pt will usually skip breakfast, but if she does eat this meal, she will go to [employer]. Actually, pt essentially gets fast food for EVERY meal. When asked how many meals a week are eaten out, the spouse reports "Breakfast, Lunch and Dinner, every day." Apparently, pt only cooked two meals at home last week and these were baked chicken and spaghetti as well as burger and fries. Pt drinks mainly sugar sweetened beverages such as juice, kool aid, soda and sweetens her coffee with a high amount of sugar. Pt and family do not eat whole grains. Additionally, family admits to using large amounts of ketchup. Overall, there is essentially no aspect of the pt or families diet that coincides with that of a diabetic diet.   Given very little prior understanding, RD focused on the glaring problems of pt's diet and did not discuss the more complex aspects such as how to carb count.   RD educated on what food groups were classified as "Carbs".  Additionally, discussed the protein and vegetable food groups and their effects on blood sugar control. Pt and family are educated on how protein/fiber work to slow the increase in BG and should be  the main part of each meal. RD showed illustration of the plate method and went over the recommended ratios of each food group per meal, that is 25% carb, 25% protein, 50% vegetable/fruit. RD gave multiple examples of meals that roughly follow this "My Plate" template.  For example, spent a large amount of time on how to make a shake in the morning that follows the my plate method. Told to use protein powder, milk, fresh fruit (not frozen), PB for additional protein and flaxseed for fiber.   Number one issue with pt's diet is her high intake of sugar sweetened beverages (SSBs). Explained to pt that this is the fastest way to increase blood sugars and frequently consuming these puts her at high risk for continued diabetes related health issues. She is advised to switch to 0 kcal or diet beverages. Gave examples of water, crystal light, mio, diet soda, sugar free Kool aid, unsweetened tea/coffee.  Secondly, highly recommended changing from white bread/grains to wheat. This recommendation was the only one the spouse was not nearly as onboard with. He states he hates the taste of wheat bread. They settled on an agreement for "white wheat" bread. They mentioned they had read that brown rice was poisonous and questioned its safety.   Thirdly, pt and family are told that eating more meals at home would help tremendously, even if they are preparing the same items as they would have gotten at fast food. This is due to the portion size of fries, sodas, sandwiches etc that are given at fast food restaurants. Ideally,  they would shift more towards lean proteins and fiber vegetables.   Patient had reported skipping meals. RD Discussed importance of controlled and consistent carbohydrate intake throughout the day. Explained how skipping meals can lead to highs and lows in blood glucose. She should aim for 3 meals a day with potentially a DM appropriate snack or two.  Finally, RD touched upon label reading. Asked pt to  look at the total carbs, fiber, and sugar amounts. She is advised to choose foods that are high in fiber and lower in sugar. Pt asked what number the sugar should say. RD instead gave her a recommendation for total carb per meal as 60g, though told endocrinologists typically recommend anywhere from  45-75 g, or 3-5 servings. If she choose a food that is very high in sugar and carbs, the remainder allotted for her meal is very minimal.   Given the amount of diet aspects that need to be changed, they are STRONGLY encouraged to make small, sustainable changes every couple weeks instead of trying to change all at once, lest they revert to their old habits like they had done after pt's first stroke due to being overwhelmed. Recommended initiating one change every couple weeks, starting with a goal of consuming no more than 1 SSB a day. After this is accomplished x 2 weeks, decrease to 1 SSB every other day, and so on.   Summary of recommendations: 1. Dramatically reduce or eliminate SSB intake. Encouraged use of artificial sweeteners 2. Switch to whole grains or make atleast 50% of grains whole grains.  3. Decrease amount of times eat out each week, slowly decrease frequency of eating out. If do eat out, stick grilled chicken or salad.  4. Do not skip meals 5. Increase vegetable/protein intake at each meal.   Expect Fair compliance. Spouse was very motivated to make changes, however the pt presented largely apathetic and did not seem nearly as interested. Additionally, they state that pt has had a stroke before and they initially made healthier changes, but reverted shortly after.   Body mass index is 41.85 kg/m. Pt meets criteria for Morbidly obese based on current BMI.  No further nutrition interventions warranted at this time. If additional nutrition issues arise, please re-consult RD.  Christophe Louis RD, LDN, CNSC Clinical Nutrition Pager: [phone] 05/20/2016 2:37 PM

## 2016-05-20 NOTE — Progress Notes (Signed)
PROGRESS NOTE    Annette Munoz  PZW:[id number] DOB: 1966-07-17 DOA: 05/18/2016 PCP: Virgilio Belling    Brief Narrative:  50 y.o. female with a past medical history significant for NIDDM, HTN and stroke last fall who presents with new acute forgetfulness.   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    Essential hypertension - allow for permissive hypertension    Uncontrolled type 2 diabetes mellitus with circulatory disorder, without long-term current use of insulin (HCC)- not well controlled currently - stable continue lantus and SSI - diabetic diet - consult dietitian for diabetic diet teaching.  DVT prophylaxis: lovenox Code Status: Full Family Communication: spoke with patient and family at bedside.  Disposition Plan: pending work up   Consultants:   Neurology   Procedures: Stroke work up   Antimicrobials: None   Subjective: Pt has no new complaints, no acute issues overnight.  Objective: Vitals:   05/19/16 2133 05/20/16 0111 05/20/16 0640 05/20/16 0922  BP: (!) 172/71 (!) 156/58 (!) 155/72 (!) 142/80  Pulse: (!) 58 60 (!) 59 62  Resp: 18 16 18 18   Temp: 98.2 F (36.8 C) 98.8 F (37.1 C) 98.2 F (36.8 C) 98.5 F (36.9 C)  TempSrc: Oral Oral Oral Oral  SpO2: 98% 99% 98% 97%  Weight:      Height:       No intake or output data in the 24 hours ending 05/20/16 1237 Filed Weights   05/19/16 0245  Weight: 103.8 kg (228 lb 12.8 oz)    Examination:  General exam: Appears calm and comfortable  Respiratory system: Clear to auscultation. Respiratory  effort normal. Cardiovascular system: No cyanosis, no edema Gastrointestinal system: Abdomen is nondistended, soft and nontender. No organomegaly or masses felt. Normal bowel sounds heard. Central nervous system: Alert and oriented. Answers questions appropriately Extremities: Symmetric 5 x 5 power. Skin: No rashes, lesions or ulcers Psychiatry: Judgement and insight appear normal. Mood & affect appropriate.     Data Reviewed: I have personally reviewed following labs and imaging studies  CBC:  Recent Labs Lab 05/18/16 1434  WBC 7.6  HGB 13.0  HCT 38.1  MCV 91.4  PLT 288   Basic Metabolic Panel:  Recent Labs Lab 05/18/16 1434  NA 134*  K 4.2  CL 104  CO2 19*  GLUCOSE 287*  BUN 16  CREATININE 0.75  CALCIUM 9.2   GFR: Estimated Creatinine Clearance: 96.2 mL/min (by C-G formula based on SCr of 0.8 mg/dL). Liver Function Tests:  Recent Labs Lab 05/18/16 1434  AST 18  ALT 20  ALKPHOS 67  BILITOT 1.2  PROT 7.0  ALBUMIN 3.9   No results for input(s): LIPASE, AMYLASE in the last 168 hours. No results for input(s): AMMONIA in the last 168 hours. Coagulation Profile: No results for input(s): INR, PROTIME in the last 168 hours. Cardiac Enzymes: No results for input(s): CKTOTAL, CKMB, CKMBINDEX, TROPONINI in the last 168 hours. BNP (last 3 results) No results for input(s): PROBNP in the last 8760 hours. HbA1C:  Recent Labs  05/19/16 0509  HGBA1C 11.1*   CBG:  Recent Labs  Lab 05/19/16 0728 05/19/16 1110 05/19/16 1617 05/20/16 0632 05/20/16 1143  GLUCAP 282* 217* 263* 255* 242*   Lipid Profile:  Recent Labs  05/19/16 0509  CHOL 133  HDL 32*  LDLCALC 83  TRIG 89  CHOLHDL 4.2   Thyroid Function Tests: No results for input(s): TSH, T4TOTAL, FREET4, T3FREE, THYROIDAB in the last 72 hours. Anemia Panel: No results for input(s): VITAMINB12, FOLATE, FERRITIN, TIBC, IRON, RETICCTPCT in the last 72 hours. Sepsis Labs: No results for input(s):  PROCALCITON, LATICACIDVEN in the last 168 hours.  Recent Results (from the past 240 hour(s))  Urine culture     Status: Abnormal   Collection Time: 05/18/16  4:49 PM  Result Value Ref Range Status   Specimen Description URINE, RANDOM  Final   Special Requests NONE  Final   Culture MULTIPLE SPECIES PRESENT, SUGGEST RECOLLECTION (A)  Final   Report Status 05/19/2016 FINAL  Final         Radiology Studies: Mr Shirlee Latch XL Contrast  Result Date: 05/18/2016 CLINICAL DATA:  Confusion for 2 days. No other focal neuro deficits. EXAM: MRI HEAD WITHOUT CONTRAST MRA HEAD WITHOUT CONTRAST TECHNIQUE: Multiplanar, multiecho pulse sequences of the brain and surrounding structures were obtained without intravenous contrast. Angiographic images of the head were obtained using MRA technique without contrast. COMPARISON:  CT angiography of the head 08/10/2015. MRI and MRA 08/07/2015. FINDINGS: MRI HEAD FINDINGS Since the previous study, there is a new area of acute infarction, 1 x 1 x 1.5 cm, involving the RIGHT hemisphere medial and anterior most thalamus, as well as the genu of the RIGHT internal capsule displaying restricted diffusion. No other areas of acute infarction are seen. No mass lesion, hydrocephalus, acute hemorrhage, or extra-axial fluid. Mild atrophy. Minor white matter disease. Chronic LEFT ACA territory infarcts involving the corpus callosum anterior to the frontal horn, and LEFT cingulate gyrus. No midline abnormality. Flow voids are maintained. Extracranial soft tissues unremarkable. MRA HEAD FINDINGS Both internal carotid arteries are patent, with no significant irregularity on the RIGHT, and only mild irregularity on the LEFT, possible 50% cavernous and supraclinoid stenosis. ICA termini bilaterally are widely patent. On the RIGHT, the fifth A1 and M1 segments are widely patent on the LEFT, there is mild irregularity of the proximal M1 segment with a 75% stenosis of the proximal LEFT A1. In the  LEFT anterior cerebral artery there is severe disease distal to the callosum marginal branch. On the RIGHT there are severe tandem stenoses in the A2 segments. No MCA branch occlusion. In the posterior circulation, the basilar is widely patent with LEFT vertebral dominant. The RIGHT vertebral is patent and contributes primarily to the RIGHT calf. Appear moderately diseased in their P2 segments, greater on the RIGHT. No definite cerebellar branch occlusion. No intracranial aneurysm. IMPRESSION: New area of acute infarction, medial RIGHT hemisphere affecting anterior-most thalamus as well as the genu of the internal capsule. It is unclear if this involves the anterior choroidal artery territory, or medial thalamoperforate territory. MCA lenticulostriate territory involvement is unlikely. Chronic changes as described, with resolved predominantly left-sided infarcts in 2016. Similar pattern of intracranial atherosclerosis when compared with prior CTA from 08/10/2015. Electronically Signed   By: Elsie Stain M.D.   On: 05/18/2016 19:44   Mr Brain Wo Contrast (neuro Protocol)  Result Date: 05/18/2016 CLINICAL DATA:  Confusion for 2 days. No other focal neuro deficits. EXAM: MRI HEAD WITHOUT CONTRAST MRA HEAD WITHOUT CONTRAST TECHNIQUE: Multiplanar, multiecho pulse sequences of the brain and  surrounding structures were obtained without intravenous contrast. Angiographic images of the head were obtained using MRA technique without contrast. COMPARISON:  CT angiography of the head 08/10/2015. MRI and MRA 08/07/2015. FINDINGS: MRI HEAD FINDINGS Since the previous study, there is a new area of acute infarction, 1 x 1 x 1.5 cm, involving the RIGHT hemisphere medial and anterior most thalamus, as well as the genu of the RIGHT internal capsule displaying restricted diffusion. No other areas of acute infarction are seen. No mass lesion, hydrocephalus, acute hemorrhage, or extra-axial fluid. Mild atrophy. Minor white matter  disease. Chronic LEFT ACA territory infarcts involving the corpus callosum anterior to the frontal horn, and LEFT cingulate gyrus. No midline abnormality. Flow voids are maintained. Extracranial soft tissues unremarkable. MRA HEAD FINDINGS Both internal carotid arteries are patent, with no significant irregularity on the RIGHT, and only mild irregularity on the LEFT, possible 50% cavernous and supraclinoid stenosis. ICA termini bilaterally are widely patent. On the RIGHT, the fifth A1 and M1 segments are widely patent on the LEFT, there is mild irregularity of the proximal M1 segment with a 75% stenosis of the proximal LEFT A1. In the LEFT anterior cerebral artery there is severe disease distal to the callosum marginal branch. On the RIGHT there are severe tandem stenoses in the A2 segments. No MCA branch occlusion. In the posterior circulation, the basilar is widely patent with LEFT vertebral dominant. The RIGHT vertebral is patent and contributes primarily to the RIGHT calf. Appear moderately diseased in their P2 segments, greater on the RIGHT. No definite cerebellar branch occlusion. No intracranial aneurysm. IMPRESSION: New area of acute infarction, medial RIGHT hemisphere affecting anterior-most thalamus as well as the genu of the internal capsule. It is unclear if this involves the anterior choroidal artery territory, or medial thalamoperforate territory. MCA lenticulostriate territory involvement is unlikely. Chronic changes as described, with resolved predominantly left-sided infarcts in 2016. Similar pattern of intracranial atherosclerosis when compared with prior CTA from 08/10/2015. Electronically Signed   By: Elsie Stain M.D.   On: 05/18/2016 19:44        Scheduled Meds: . aspirin  325 mg Oral Daily  . atorvastatin  80 mg Oral q1800  . clopidogrel  75 mg Oral Daily  . enoxaparin (LOVENOX) injection  40 mg Subcutaneous Q24H  . insulin aspart  0-15 Units Subcutaneous TID WC  . insulin  glargine  10 Units Subcutaneous QHS   Continuous Infusions:    LOS: 2 days    Time spent: > 35 minutes   Penny Pia, MD [facility] Pager [phone]  If 7PM-7AM, please contact night-coverage [email or web address] Password [facility] 05/20/2016, 12:37 PM

## 2016-05-21 LAB — GLUCOSE, CAPILLARY
Glucose-Capillary: 238 mg/dL — ABNORMAL HIGH (ref 65–99)
Glucose-Capillary: 214 mg/dL — ABNORMAL HIGH (ref 65–99)

## 2016-05-21 MED ORDER — LISINOPRIL 40 MG PO TABS: 40.0000 mg | ORAL_TABLET | Freq: Every day | ORAL | 3 refills | Status: DC

## 2016-05-21 MED ORDER — CLOPIDOGREL BISULFATE 75 MG PO TABS: 75.0000 mg | ORAL_TABLET | Freq: Every day | ORAL | 0 refills | Status: DC

## 2016-05-21 NOTE — Discharge Summary (Signed)
Physician Discharge Summary  Annette Munoz YBO:[id number] DOB: Mar 08, 1966 DOA: 05/18/2016  PCP: Elizabeth Sauer  Admit date: 05/18/2016 Discharge date: 05/21/2016  Time spent: > 35 minutes  Recommendations for Outpatient Follow-up:  1. Ensure f/u with Neurology. Plavix and aspirin for the next 3 months then plavix by itself 2. Monitor blood sugars and adjust hypoglycemic agents accordingly   Discharge Diagnoses:  Principal Problem:   CVA (cerebral infarction) Active Problems:   Pure hypercholesterolemia   Essential hypertension   Uncontrolled type 2 diabetes mellitus with circulatory disorder, without long-term current use of insulin (HCC)   Acute ischemic stroke ([place])   Confusion   Discharge Condition: stable  Diet recommendation: diabetic diet/heart healthy  Filed Weights   05/19/16 0245  Weight: 103.8 kg (228 lb 12.8 oz)    History of present illness:  50 y.o. female with a past medical history significant for NIDDM, HTN and stroke last fall who presents with new acute forgetfulness. Diagnosed with MRI/MRA of the head and neck showed similar pattern of atherosclerotic disease in the left vasculature, and a new 1 cm right thalamic stroke  Hospital Course:   Principal Problem:   CVA (cerebral infarction)/  Acute ischemic stroke [facility]) - Neurology on board while patient in house and recommended the following: Stroke:  R thalamic/genu internal capsule infarct, likely embolic secondary to unknown source  MRI  R thalamic/genu internal capsule infarct. Old L brain infarcts.  MRA  Unchanged from CTA 08/10/2015  Carotid Doppler  No significant stenosis   2D Echo  EF 60-65%. No source of embolus   LDL 83  HgbA1c 11.1  Lovenox 40 mg sq daily for VTE prophylaxis  Diet Carb Modified Fluid consistency: Thin; Room service appropriate? Yes  aspirin 81 mg daily prior to admission, now on aspirin 325 mg daily. Recommend DAPT with ASA 328m and plavix 753mfor 3 months then  plavix along for further stroke prevention due to intracranial stenosis.  Patient counseled to be compliant with her antithrombotic medications  Ongoing aggressive stroke risk factor management  Active Problems:   Pure hypercholesterolemia - stable on lipitor.    Essential hypertension - begin to normalize BP within 5 days of recent stroke. Put appropriate start date on lisinopril    Uncontrolled type 2 diabetes mellitus with circulatory disorder, without long-term current use of insulin [place] not well controlled currently - continue diabetic diet - continue Metformin  Procedures: Mri & Mra Brain Wo Contrast (neuro Protocol) 05/18/2016 New area of acute infarction, medial RIGHT hemisphere affecting anterior-most thalamus as well as the genu of the internal capsule. It is unclear if this involves the anterior choroidal artery territory, or medial thalamoperforate territory. MCA lenticulostriate territory involvement is unlikely. Chronic changes as described, with resolved predominantly left-sided infarcts in 2016. Similar pattern of intracranial atherosclerosis when compared with prior CTA from 08/10/2015.   CTA Head 08/10/2015 1. Infarcts involving the right hypothalamus, anterior genu of corpus callosum on the left, left distal ACA distribution. 2. A high-grade stenosis in proximal right A2 segment and more distal left A3 segment, within the left pericallosal artery. 3. Diffuse attenuation of distal ACA branch vessels, worse on the left. 4. Moderate proximal more mild distal left M1 segment stenoses. 5. Moderate proximal left M2 stenoses beyond the bifurcation. 6. Mild attenuation of distal MCA branch vessels bilaterally. 7. Moderate proximal PCA stenoses bilaterally with significant attenuation of distal small vessels.  Carotid Doppler   There is 1-39% bilateral ICA stenosis. Vertebral artery flow is antegrade.  2D Echocardiogram  - Left ventricle: The cavity size was  normal. Wall thickness wasincreased in a pattern of moderate LVH. There was mild concentrichypertrophy. Systolic function was normal. The estimated ejectionfraction was in the range of 60% to 65%. Wall motion was normal;there were no regional wall motion abnormalities. Dopplerparameters are consistent with abnormal left ventricularrelaxation (grade 1 diastolic dysfunction). The E/e&' ratio isbetween 8-15, suggesting indeterminate LV filling pressure. - Left atrium: Moderately dilated. - Right atrium: The atrium was at the upper limits of normal insize. - Inferior vena cava: The vessel was normal in size. Therespirophasic diameter changes were in the normal range (>= 50%),consistent with normal central venous pressure. Impressions:    Compared to a prior study in 2016, there is now mild LVH, IOEV03-50%, diastolic dysfunction and moderate left atrialenlargement.   Consultations:  Neurology  Discharge Exam: Vitals:   05/21/16 0544 05/21/16 0929  BP: (!) 149/58 (!) 155/88  Pulse: 61 95  Resp: 18 18  Temp: 98.7 F (37.1 C) 98.4 F (36.9 C)    General: Pt in nad, alert and awake Cardiovascular: rrr, no mrg Respiratory: cta bl, no wheezes  Discharge Instructions   Discharge Instructions    Call MD for:  redness, tenderness, or signs of infection (pain, swelling, redness, odor or green/yellow discharge around incision site)    Complete by:  As directed   Call MD for:  temperature >100.4    Complete by:  As directed   Diet - low sodium heart healthy    Complete by:  As directed   Discharge instructions    Complete by:  As directed   Please follow up with your neurologist in after hospital discharge, please call their office to confirm follow up date and time.   Increase activity slowly    Complete by:  As directed     Current Discharge Medication List    START taking these medications   Details  clopidogrel (PLAVIX) 75 MG tablet Take 1 tablet (75 mg total) by mouth  daily. Qty: 30 tablet, Refills: 0      CONTINUE these medications which have CHANGED   Details  lisinopril (PRINIVIL,ZESTRIL) 40 MG tablet Take 1 tablet (40 mg total) by mouth daily. 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 E11.65 Qty: 100 each, Refills: 3    aspirin 325 MG tablet Take 1 tablet (325 mg total) by mouth daily. 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 E11.65 Qty: 1 each, Refills: 0    Blood Pressure Monitoring (PREMIUM AUTOMATIC BP MONITOR) DEVI 1 Device by Does not apply route daily. Qty: 1 Device, Refills: 0   Associated Diagnoses: Essential hypertension    glucose blood test strip Use to check blood sugar daily. E11.65 Qty: 100 each, Refills: 3    Lancet Devices (LANCING DEVICE) MISC Use daily  to check blood sugar. E11.65 Qty: 1 each, Refills: 0    Lancets MISC Use to check blood sugar daily. E11.65 Qty: 100 each, Refills: 3    metFORMIN (GLUCOPHAGE XR) 500 MG 24 hr tablet Take 2 tablets (1,000 mg total) by mouth 2 (two) times daily. Qty: 120 tablet, Refills: 0   Associated Diagnoses: Diabetes type 2, uncontrolled ([place])       No Known Allergies Follow-up Information    Xu,Jindong, MD. Go on 05/22/2016.   Specialty:  Neurology Contact information: [address] [place]  [address] [phone]            The results of significant diagnostics from this hospitalization (including imaging, microbiology, ancillary and laboratory) are listed below for reference.    Significant Diagnostic Studies: Mr Virgel Paling EV Contrast  Result Date: 05/18/2016 CLINICAL DATA:  Confusion for 2 days. No other focal neuro deficits. EXAM: MRI HEAD WITHOUT CONTRAST MRA HEAD WITHOUT CONTRAST TECHNIQUE: Multiplanar, multiecho pulse sequences of the brain and surrounding structures were obtained without intravenous contrast. Angiographic images of the head were obtained using MRA technique without contrast. COMPARISON:  CT angiography of the head 08/10/2015. MRI and MRA 08/07/2015. FINDINGS: MRI HEAD FINDINGS Since the previous study, there is a new area of acute infarction, 1 x 1 x 1.5 cm, involving the RIGHT hemisphere medial and anterior most thalamus, as well as the genu of the RIGHT internal capsule displaying restricted diffusion. No other areas of acute infarction are seen. No mass lesion, hydrocephalus, acute hemorrhage, or extra-axial fluid. Mild atrophy. Minor white matter disease. Chronic LEFT ACA territory infarcts involving the corpus callosum anterior to the frontal horn, and LEFT cingulate gyrus. No midline abnormality. Flow voids are maintained. Extracranial soft tissues unremarkable. MRA HEAD FINDINGS Both internal carotid arteries are patent, with no significant irregularity on the RIGHT, and only mild irregularity on the LEFT, possible 50% cavernous and supraclinoid stenosis. ICA termini bilaterally are widely patent. On the RIGHT, the fifth A1 and M1 segments are widely patent on the LEFT, there is mild irregularity of the proximal M1 segment with a 75% stenosis of the proximal LEFT A1. In the LEFT anterior cerebral artery there is severe disease distal to the callosum marginal branch. On the RIGHT there are severe tandem stenoses in the A2 segments. No MCA branch occlusion.  In the posterior circulation, the basilar is widely patent with LEFT vertebral dominant. The RIGHT vertebral is patent and contributes primarily to the RIGHT calf. Appear moderately diseased in their P2 segments, greater on the RIGHT. No definite cerebellar branch occlusion. No intracranial aneurysm. IMPRESSION: New area of acute infarction, medial RIGHT hemisphere affecting anterior-most thalamus as well as the genu of the internal capsule. It is unclear if this involves the anterior choroidal artery territory, or medial thalamoperforate territory. MCA lenticulostriate territory involvement is unlikely. Chronic changes as described, with resolved predominantly left-sided infarcts in 2016. Similar pattern of intracranial atherosclerosis when compared with prior CTA from 08/10/2015. Electronically Signed   By: Staci Righter M.D.   On: 05/18/2016 19:44   Mr Brain Wo Contrast (neuro Protocol)  Result Date: 05/18/2016 CLINICAL DATA:  Confusion for 2 days. No other focal neuro deficits. EXAM: MRI HEAD WITHOUT CONTRAST MRA HEAD WITHOUT CONTRAST TECHNIQUE: Multiplanar, multiecho pulse sequences of the brain and surrounding structures were obtained without intravenous contrast. Angiographic images of the head were obtained using MRA technique without contrast. COMPARISON:  CT angiography of the head 08/10/2015. MRI and MRA 08/07/2015. FINDINGS:  MRI HEAD FINDINGS Since the previous study, there is a new area of acute infarction, 1 x 1 x 1.5 cm, involving the RIGHT hemisphere medial and anterior most thalamus, as well as the genu of the RIGHT internal capsule displaying restricted diffusion. No other areas of acute infarction are seen. No mass lesion, hydrocephalus, acute hemorrhage, or extra-axial fluid. Mild atrophy. Minor white matter disease. Chronic LEFT ACA territory infarcts involving the corpus callosum anterior to the frontal horn, and LEFT cingulate gyrus. No midline abnormality. Flow voids are maintained.  Extracranial soft tissues unremarkable. MRA HEAD FINDINGS Both internal carotid arteries are patent, with no significant irregularity on the RIGHT, and only mild irregularity on the LEFT, possible 50% cavernous and supraclinoid stenosis. ICA termini bilaterally are widely patent. On the RIGHT, the fifth A1 and M1 segments are widely patent on the LEFT, there is mild irregularity of the proximal M1 segment with a 75% stenosis of the proximal LEFT A1. In the LEFT anterior cerebral artery there is severe disease distal to the callosum marginal branch. On the RIGHT there are severe tandem stenoses in the A2 segments. No MCA branch occlusion. In the posterior circulation, the basilar is widely patent with LEFT vertebral dominant. The RIGHT vertebral is patent and contributes primarily to the RIGHT calf. Appear moderately diseased in their P2 segments, greater on the RIGHT. No definite cerebellar branch occlusion. No intracranial aneurysm. IMPRESSION: New area of acute infarction, medial RIGHT hemisphere affecting anterior-most thalamus as well as the genu of the internal capsule. It is unclear if this involves the anterior choroidal artery territory, or medial thalamoperforate territory. MCA lenticulostriate territory involvement is unlikely. Chronic changes as described, with resolved predominantly left-sided infarcts in 2016. Similar pattern of intracranial atherosclerosis when compared with prior CTA from 08/10/2015. Electronically Signed   By: Staci Righter M.D.   On: 05/18/2016 19:44    Microbiology: Recent Results (from the past 240 hour(s))  Urine culture     Status: Abnormal   Collection Time: 05/18/16  4:49 PM  Result Value Ref Range Status   Specimen Description URINE, RANDOM  Final   Special Requests NONE  Final   Culture MULTIPLE SPECIES PRESENT, SUGGEST RECOLLECTION (A)  Final   Report Status 05/19/2016 FINAL  Final     Labs: Basic Metabolic Panel:  Recent Labs Lab 05/18/16 1434  NA 134*   K 4.2  CL 104  CO2 19*  GLUCOSE 287*  BUN 16  CREATININE 0.75  CALCIUM 9.2   Liver Function Tests:  Recent Labs Lab 05/18/16 1434  AST 18  ALT 20  ALKPHOS 67  BILITOT 1.2  PROT 7.0  ALBUMIN 3.9   No results for input(s): LIPASE, AMYLASE in the last 168 hours. No results for input(s): AMMONIA in the last 168 hours. CBC:  Recent Labs Lab 05/18/16 1434  WBC 7.6  HGB 13.0  HCT 38.1  MCV 91.4  PLT 288   Cardiac Enzymes: No results for input(s): CKTOTAL, CKMB, CKMBINDEX, TROPONINI in the last 168 hours. BNP: BNP (last 3 results) No results for input(s): BNP in the last 8760 hours.  ProBNP (last 3 results) No results for input(s): PROBNP in the last 8760 hours.  CBG:  Recent Labs Lab 05/20/16 1143 05/20/16 1616 05/20/16 2145 05/21/16 0640 05/21/16 1109  GLUCAP 242* 206* 184* 238* 214*       Signed:  Velvet Bathe MD.  [facility] 05/21/2016, 1:22 PM

## 2016-05-22 ENCOUNTER — Ambulatory Visit (INDEPENDENT_AMBULATORY_CARE_PROVIDER_SITE_OTHER): Payer: Commercial Managed Care - HMO | Admitting: Neurology

## 2016-05-22 VITALS — BP 238/130 | HR 64 | Ht 62.0 in | Wt 224.0 lb

## 2016-05-22 DIAGNOSIS — E1165 Type 2 diabetes mellitus with hyperglycemia: Secondary | ICD-10-CM

## 2016-05-22 DIAGNOSIS — E1159 Type 2 diabetes mellitus with other circulatory complications: Secondary | ICD-10-CM | POA: Diagnosis not present

## 2016-05-22 DIAGNOSIS — I6330 Cerebral infarction due to thrombosis of unspecified cerebral artery: Secondary | ICD-10-CM

## 2016-05-22 DIAGNOSIS — IMO0002 Reserved for concepts with insufficient information to code with codable children: Secondary | ICD-10-CM

## 2016-05-22 DIAGNOSIS — I10 Essential (primary) hypertension: Secondary | ICD-10-CM | POA: Diagnosis not present

## 2016-05-22 DIAGNOSIS — R413 Other amnesia: Secondary | ICD-10-CM

## 2016-05-22 LAB — GLUCOSE, CAPILLARY: GLUCOSE-CAPILLARY: 224 mg/dL — AB (ref 65–99)

## 2016-05-22 MED ORDER — GLIPIZIDE 2.5 MG HALF TABLET: 5.0000 mg | ORAL_TABLET | Freq: Two times a day (BID) | ORAL | Status: DC

## 2016-05-22 MED ORDER — AMLODIPINE BESYLATE 2.5 MG PO TABS: 10.0000 mg | ORAL_TABLET | Freq: Every day | ORAL | Status: DC

## 2016-05-22 MED ORDER — DONEPEZIL HCL 5 MG PO TABS: 5.0000 mg | ORAL_TABLET | Freq: Every day | ORAL | 0 refills | Status: DC

## 2016-05-22 NOTE — Patient Instructions (Addendum)
-   continue ASA and plavix and lipitor for stroke prevention - will add amlodipine for BP control together with lisinopril - will add glipizide for glucose control together with metformin. However, discuss with Dr. Parke SimmersBland and consider insulin.  - will prescribe aricept for memory loss, take 5mg  daily for one month. Then call us to see if we can increase to 10mg  next month. - close check BP and glucose at home and record and bring over to Dr. Parke SimmersBland for medication adjustment if needed. BP twice a day and glucose check 4 times a day - Follow up with your primary care physician for stroke risk factor modification. Recommend maintain blood pressure goal <140/80, diabetes with hemoglobin A1c goal below 7.0% and lipids with LDL cholesterol goal below 70 mg/dL.  - recommend not to go back to work for about 2 weeks until BP and glucose in better control. Recommend supervision at work and at driving.  - diabetic diet and regular exercise. Has to sweat for the intensity of exercise. - follow up in 2 months.

## 2016-05-23 ENCOUNTER — Telehealth: Payer: Self-pay | Admitting: Neurology

## 2016-05-23 ENCOUNTER — Encounter: Payer: Self-pay | Admitting: Neurology

## 2016-05-23 MED ORDER — AMLODIPINE BESYLATE 10 MG PO TABS: 10.0000 mg | ORAL_TABLET | Freq: Every day | ORAL | 5 refills | Status: DC

## 2016-05-23 MED ORDER — GLIPIZIDE ER 5 MG PO TB24: 5.0000 mg | ORAL_TABLET | Freq: Every day | ORAL | 5 refills | Status: DC

## 2016-05-23 NOTE — Telephone Encounter (Signed)
Pt's daughter called and said rx glipiZIDE (GLUCOTROL) tablet 5 mg and amLODipine (NORVASC) tablet 10 mg were not sent in to [id number] - [place], [place] - [address]

## 2016-05-23 NOTE — Progress Notes (Signed)
STROKE NEUROLOGY FOLLOW UP NOTE  NAME: Annette Munoz DOB: 1966-01-22  REASON FOR VISIT: stroke follow up HISTORY FROM: pt and chart  Today we had the pleasure of seeing Annette Munoz in follow-up at our [facility]. Pt was accompanied by husband and daughter.   History Summary Annette Munoz is a 50 y.o. female with history of DM, HTN, obesity, HLD was admitted on 08/07/15 for AMS and amnesia. 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 MRA and CTA showed left ACA, right ACA, left M1, M2, and b/l PCA mild to moderate stenosis. Had LP and negative for vasculitis. CUS and TTE as well as TEE were unremarkable. Hypercoagulable and autoimmue work up negative. LDL 138 and A1C 7.2. Her symptoms resolved and she was discharged with ASA and lipitor.  09/30/15 follow up - the patient has been doing well. No recurrent stroke like symptoms. However, she did not check BP at home and her glucometer does not work. BP today 185/82. As per husband, she still has short term memory deficit. Pt not sure if she is on lisinopril which is on her medication list.  Follow up 12/20/15 - pt continued to have memory issues, not remember things with behavior changes since stroke. Pt seems more apathic, not compliant with medication, not refill her medication, not check BP or glucose at home and lack of activity at home. Today BP 178/86. 30 day cardiac event monitoring negative for afib. TCD MES suboptimal but negative for microemboli.   Follow-up 03/17/2016 - pt continued to be noncompliance. She did not take BP at home, nor checking glucose. Her medication ran out and has not picked up at pharmacy yet. She did not call back for diabetic education calls. She did not do much healthy diet and home exercise yet. She has not followed up with PCP. Her BP today 165/81.   Admission 05/18/2016 - patient was admitted due to acute confusion and worsening memory  loss. MRI showed right small thalamic infarct, superior than the first infarct. CUS and TTE unremarkable, LDL 83 and A1c 11.1. She had again diabetic education, and put on DAPT and continue on Lipitor 80 on discharge.  Interval History During the interval time, patient is stable, however, had profound short-term memory loss. Not able to remember 3 words given 1 minute ago. Family complains of memory loss causing dysfunction at home and at work. Occasionally lost direction during driving. BP super high today 238/130. Noncompliant diabetic diet and BP/glucose monitoring at home.  REVIEW OF SYSTEMS: Full 14 system review of systems performed and notable only for those listed below and in HPI above, all others are negative:  Constitutional:   Cardiovascular:  Ear/Nose/Throat:   Skin:  Eyes:   Respiratory:  Cough Gastroitestinal:   Genitourinary:  Hematology/Lymphatic:   Endocrine:  Musculoskeletal:   Allergy/Immunology:   Neurological:  Short-term memory loss Psychiatric: Confusion Sleep:   The following represents the patient's updated allergies and side effects list: No Known Allergies  The neurologically relevant items on the patient's problem list were reviewed on today's visit.  Neurologic Examination  A problem focused neurological exam (12 or more points of the single system neurologic examination, vital signs counts as 1 point, cranial nerves count for 8 points) was performed.  Blood pressure (!) 238/130, pulse 64, height 5\' 2"  (1.575 m), weight 224 lb (101.6 kg), last menstrual period 05/18/2016.  General - obese, well developed, in no apparent distress.  Ophthalmologic - Fundi not visualized due to eye movement.  Cardiovascular - Regular rate and rhythm.  Mental Status -  Level of arousal and orientation to time, place, and person were intact. Language including expression, naming, repetition, comprehension was assessed and found intact.  mini-mental status  exam Orientation to time - 4/5 Orientation to place - 5/5 Registration - 3/3 Attention - 3/5 Delayed recall - 0/3 Naming - 2/2 Repetition - 1/1 Comprehension - 3/3 Reading - 1/1 Writing - 1/1 Visuospatial - 1/1 Total 24/30   Cranial Nerves II - XII - II - Visual field intact OU. III, IV, VI - Extraocular movements intact. V - Facial sensation intact bilaterally. VII - Facial movement intact bilaterally. VIII - Hearing & vestibular intact bilaterally. X - Palate elevates symmetrically. XI - Chin turning & shoulder shrug intact bilaterally. XII - Tongue protrusion intact.  Motor Strength - The patient's strength was normal in all extremities and pronator drift was absent.  Bulk was normal and fasciculations were absent.   Motor Tone - Muscle tone was assessed at the neck and appendages and was normal.  Reflexes - The patient's reflexes were 1+ in all extremities and she had no pathological reflexes.  Sensory - Light touch, temperature/pinprick, vibration and proprioception, and Romberg testing were assessed and were normal.    Coordination - The patient had normal movements in the hands and feet with no ataxia or dysmetria.  Tremor was absent.  Gait and Station - The patient's transfers, posture, gait, station, and turns were observed as normal.  Data reviewed: I personally reviewed the images and agree with the radiology interpretations.  Ct Head Wo Contrast 08/07/2015  Focal low density in the left frontal lobe white matter which may be in the corpus callosum. Acute infarct is not excluded.   Mr [name] Head/brain Wo Cm 08/07/2015  1. Multiple small, acute left ACA territory infarcts involving the corpus callosum, cingulate gyrus, and superior left frontal lobe.  2. Small acute infarct involving the anteroinferior right thalamus/cerebral peduncle.  3. Moderately to severely motion degraded head MRA without evidence of large vessel occlusion or flow limiting proximal  stenosis. Suggestion of A2 ACA segmental narrowing.   CTA head  08/10/2015 1. Infarcts involving the right hypothalamus, anterior genu of corpus callosum on the left, left distal ACA distribution. 2. A high-grade stenosis in proximal right A2 segment and more distal left A3 segment, within the left pericallosal artery. 3. Diffuse attenuation of distal ACA branch vessels, worse on the left. 4. Moderate proximal more mild distal left M1 segment stenoses. 5. Moderate proximal left M2 stenoses beyond the bifurcation. 6. Mild attenuation of distal MCA branch vessels bilaterally. 7. Moderate proximal PCA stenoses bilaterally with significant attenuation of distal small vessels.  Carotid Doppler 1-39% ICA plaquing. Vertebral artery flow is antegrade.  2D Echocardiogram  - Left ventricle: The cavity size was normal. There was mildconcentric hypertrophy. Systolic function was normal. Theestimated ejection fraction was in the range of 55% to 60%. Wallmotion was normal; there were no regional wall motionabnormalities. - Left atrium: The atrium was mildly dilated. - Atrial septum: No defect or patent foramen ovale was identified.  LE venous dopplers negative for DVT  TEE  1. No LAA thrombus 2. Negative for PFO 3. No significant valvular disease 4. LVEF 55-60%  30 day cardiac monitoring - negative for afib  TCD MES - suboptimal study, no MES seen.   Mri & Mra Brain Wo Contrast (neuro Protocol) 05/18/2016 New area of acute  infarction, medial RIGHT hemisphere affecting anterior-most thalamus as well as the genu of the internal capsule. It is unclear if this involves the anterior choroidal artery territory, or medial thalamoperforate territory. MCA lenticulostriate territory involvement is unlikely. Chronic changes as described, with resolved predominantly left-sided infarcts in 2016. Similar pattern of intracranial atherosclerosis when compared with prior CTA from 08/10/2015.   Carotid  Doppler  05/19/16 There is 1-39% bilateral ICA stenosis. Vertebral artery flow is antegrade.    2D Echocardiogram 05/19/16 - Left ventricle: The cavity size was normal. Wall thickness wasincreased in a pattern of moderate LVH. There was mild concentrichypertrophy. Systolic function was normal. The estimated ejectionfraction was in the range of 60% to 65%. Wall motion was normal;there were no regional wall motion abnormalities. Dopplerparameters are consistent with abnormal left ventricularrelaxation (grade 1 diastolic dysfunction). The E/e&' ratio isbetween 8-15, suggesting indeterminate LV filling pressure. - Left atrium: Moderately dilated. - Right atrium: The atrium was at the upper limits of normal insize. - Inferior vena cava: The vessel was normal in size. Therespirophasic diameter changes were in the normal range (>= 50%),consistent with normal central venous pressure. Impressions:    Compared to a prior study in 2016, there is now mild LVH, LVEF60-65%, diastolic dysfunction and moderate left atrialenlargement.   CSF Component     Latest Ref Rng 08/11/2015         2:56 PM  Tube #      1  Color, CSF     COLORLESS COLORLESS  Appearance, CSF     CLEAR CLEAR  Supernatant      NOT INDICATED  RBC Count, CSF     0 /cu mm 48 (H)  WBC, CSF     0 - 5 /cu mm 1  Other Cells, CSF      TOO FEW TO COUNT, SMEAR AVAILABLE FOR REVIEW  Specimen Description        Special Requests        Gram Stain        Culture        Report Status        Fungal Smear        Crypto Ag     NEGATIVE NEGATIVE  Cryptococcal Ag Titer     NOT INDICATED NOT INDICATED  Glucose, CSF     40 - 70 mg/dL 87 (H)  Total  Protein, CSF     15 - 45 mg/dL 21  VDRL Quant, CSF     Non Rea:<1:1 Non Reactive  CSF Oligoclonal Bands      Comment   Component     Latest Ref Rng 08/11/2015         2:57 PM  Tube #        Color, CSF     COLORLESS   Appearance, CSF     CLEAR   Supernatant          RBC Count, CSF     0 /cu mm   WBC, CSF     0 - 5 /cu mm   Other Cells, CSF        Specimen Description      CSF  Special Requests      NONE  Gram Stain      WBC PRESENT, PREDOMINANTLY MONONUCLEAR . . .  Culture      NO GROWTH 3 DAYS  Report Status      08/14/2015 FINAL  Fungal Smear        Crypto Ag  NEGATIVE   Cryptococcal Ag Titer     NOT INDICATED   Glucose, CSF     40 - 70 mg/dL   Total  Protein, CSF     15 - 45 mg/dL   VDRL Quant, CSF     Non Rea:<1:1   CSF Oligoclonal Bands         Component     Latest Ref Rng 08/11/2015         2:58 PM  Tube #        Color, CSF     COLORLESS   Appearance, CSF     CLEAR   Supernatant        RBC Count, CSF     0 /cu mm   WBC, CSF     0 - 5 /cu mm   Other Cells, CSF        Specimen Description      CSF  Special Requests      NONE  Gram Stain        Culture      No Fungi Isolated in 4 Weeks . . .  Report Status      09/08/2015 FINAL  Fungal Smear      NO YEAST OR FUNGAL ELEMENTS SEEN . . .  Crypto Ag     NEGATIVE   Cryptococcal Ag Titer     NOT INDICATED   Glucose, CSF     40 - 70 mg/dL   Total  Protein, CSF     15 - 45 mg/dL   VDRL Quant, CSF     Non Rea:<1:1   CSF Oligoclonal Bands         Component     Latest Ref Rng 08/08/2015 08/10/2015 08/11/2015  Cholesterol     0 - 200 mg/dL 960206 (H)    Triglycerides     <150 mg/dL 454126    HDL Cholesterol     >40 mg/dL 43    Total CHOL/HDL Ratio      4.8    VLDL     0 - 40 mg/dL 25    LDL (calc)     0 - 99 mg/dL 098138 (H)    PTT Lupus Anticoagulant     0.0 - 40.6 sec  35.4   DRVVT     0.0 - 44.0 sec  34.0   Lupus Anticoag Interp       Comment:   Anticardiolipin IgG     0 - 14 GPL U/mL  <9   Anticardiolipin IgM     0 - 12 MPL U/mL  <9   Anticardiolipin IgA     0 - 11 APL U/mL  <9   Beta-2 Glyco I IgG     0 - 20 GPI IgG units  <9   Beta-2-Glycoprotein I IgM     0 - 32 GPI IgM units  <9   Beta-2-Glycoprotein I IgA     0 - 25 GPI IgA  units  <9   C-ANCA     Neg:<1:20 titer  <1:20   P-ANCA     Neg:<1:20 titer  <1:20   Atypical P-ANCA titer     Neg:<1:20 titer  <1:20   Hemoglobin A1C     4.8 - 5.6 % 7.2 (H)    Mean Plasma Glucose      160    Recommendations-PTGENE:       Comment   Additional Information       Comment  Source Varicella-Zoster, PCR        BLOOD  Varicella-Zoster, PCR     Negative   Negative  ANA Ab, IFA       Negative   C3 Complement     82 - 167 mg/dL  161   Complement C4, Body Fluid     14 - 44 mg/dL  31   Compl, Total (WR60)     42 - 60 U/mL  > 60   HIV     Non Reactive  Non Reactive   Antithrombin Activity     75 - 120 %  103   Protein C Activity     73 - 180 %  152   Protein C, Total     60 - 150 %  131   Protein S Activity     63 - 140 %  85   Protein S Ag, Total     60 - 150 %  110   Homocysteine     0.0 - 15.0 umol/L  7.4   RPR     Non Reactive  Non Reactive   Sed Rate     0 - 22 mm/hr  46 (H)   ds DNA Ab     0 - 9 IU/mL  <1   SSA (Ro) (ENA) Antibody, IgG     0.0 - 0.9 AI  <0.2   SSB (La) (ENA) Antibody, IgG     0.0 - 0.9 AI  <0.2   Varicella     Immune >165 index  2055     Assessment: As you may recall, she is a 50 y.o. African American female with PMH of DM, HTN, obesity, HLD was admitted on 08/07/15 for AMS and amnesia. 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 MRA and CTA showed left ACA, right ACA, left M1, M2, and b/l PCA mild to moderate stenosis. Had LP and negative for vasculitis. CUS and TTE as well as TEE were unremarkable. Hypercoagulable and autoimmue work up negative. LDL 138 and A1C 7.2. Her symptoms resolved and she was discharged with ASA and lipitor. During the interval time, no recurrent stroke like symptoms. However, BP and glucose not in control. 30 day cardiac monitoring and TCD MES all negative. Pt declined RESPECT ESUS. Readmitted on 05/18/16 due to confusion and profound memory loss,  found to have right thalamic small infarct superior to prior infarct. MRA and change from prior CTA. CUS and TTE unremarkable, LDL 83 and A1c 11.1. She was discharged on DAPT and continued with high-dose Lipitor. She still have profound short-term memory loss, will put on Aricept. BP and glucose not in control, will add a new BP and DM metastases. Close follow-up with PCP. Encourage compliance with medication, BP and glucose checking.   Plan:  - continue ASA and plavix and lipitor for stroke prevention - will add amlodipine for BP control together with lisinopril - will add glipizide for glucose control together with metformin. However, discuss with Dr. Parke Simmers and consider insulin.  - will prescribe aricept for memory loss, take 5mg  daily for one month. Then call us to see if we can increase to 10mg  next month. - close check BP and glucose at home and record and bring over to Dr. Parke Simmers for medication adjustment if needed. BP twice a day and glucose check 4 times a day - Follow up with your primary care physician for stroke risk factor modification. Recommend maintain blood pressure  goal <140/80, diabetes with hemoglobin A1c goal below 7.0% and lipids with LDL cholesterol goal below 70 mg/dL.  - diabetic diet and regular exercise.  - follow up in 2 months.   I spent more than 25 minutes of face to face time with the patient. Greater than 50% of time was spent in counseling and coordination of care. We have discussed about BP and glucose monitoring, memory loss and compliance with medication   No orders of the defined types were placed in this encounter.   Meds ordered this encounter  Medications  . amLODipine (NORVASC) tablet 10 mg  . glipiZIDE (GLUCOTROL) tablet 5 mg  . donepezil (ARICEPT) 5 MG tablet    Sig: Take 1 tablet (5 mg total) by mouth at bedtime.    Dispense:  30 tablet    Refill:  0    Patient Instructions  - continue ASA and plavix and lipitor for stroke prevention - will add  amlodipine for BP control together with lisinopril - will add glipizide for glucose control together with metformin. However, discuss with Dr. Parke Simmers and consider insulin.  - will prescribe aricept for memory loss, take 5mg  daily for one month. Then call us to see if we can increase to 10mg  next month. - close check BP and glucose at home and record and bring over to Dr. Parke Simmers for medication adjustment if needed. BP twice a day and glucose check 4 times a day - Follow up with your primary care physician for stroke risk factor modification. Recommend maintain blood pressure goal <140/80, diabetes with hemoglobin A1c goal below 7.0% and lipids with LDL cholesterol goal below 70 mg/dL.  - recommend not to go back to work for about 2 weeks until BP and glucose in better control. Recommend supervision at work and at driving.  - diabetic diet and regular exercise. Has to sweat for the intensity of exercise. - follow up in 2 months.      Marvel Plan, MD PhD [facility] [address] [place], [place] [address] [phone]

## 2016-05-23 NOTE — Telephone Encounter (Signed)
Rn call patients daughter that the glipizide and norvasc was sent again the pharmacy. [facility] and did have another stroke. PTs daughter verbalized understanding.

## 2016-05-23 NOTE — Addendum Note (Signed)
Addended by: Marvel PlanXU, Zi Sek on: 05/23/2016 01:07 PM   Modules accepted: Orders

## 2016-06-01 ENCOUNTER — Encounter (HOSPITAL_COMMUNITY): Payer: Self-pay | Admitting: Emergency Medicine

## 2016-06-01 DIAGNOSIS — I10 Essential (primary) hypertension: Secondary | ICD-10-CM | POA: Diagnosis not present

## 2016-06-01 DIAGNOSIS — R739 Hyperglycemia, unspecified: Secondary | ICD-10-CM | POA: Diagnosis present

## 2016-06-01 DIAGNOSIS — Z5321 Procedure and treatment not carried out due to patient leaving prior to being seen by health care provider: Secondary | ICD-10-CM | POA: Insufficient documentation

## 2016-06-01 DIAGNOSIS — E1165 Type 2 diabetes mellitus with hyperglycemia: Secondary | ICD-10-CM | POA: Diagnosis not present

## 2016-06-01 LAB — CBC WITH DIFFERENTIAL/PLATELET
BASOS ABS: 0.1 10*3/uL (ref 0.0–0.1)
BASOS PCT: 1 %
EOS ABS: 0.1 10*3/uL (ref 0.0–0.7)
EOS PCT: 2 %
HCT: 34.7 % — ABNORMAL LOW (ref 36.0–46.0)
Hemoglobin: 11.2 g/dL — ABNORMAL LOW (ref 12.0–15.0)
Lymphocytes Relative: 36 %
Lymphs Abs: 3.5 10*3/uL (ref 0.7–4.0)
MCH: 30.4 pg (ref 26.0–34.0)
MCHC: 32.3 g/dL (ref 30.0–36.0)
MCV: 94 fL (ref 78.0–100.0)
Monocytes Absolute: 0.5 10*3/uL (ref 0.1–1.0)
Monocytes Relative: 5 %
Neutro Abs: 5.5 10*3/uL (ref 1.7–7.7)
Neutrophils Relative %: 56 %
PLATELETS: 354 10*3/uL (ref 150–400)
RBC: 3.69 MIL/uL — AB (ref 3.87–5.11)
RDW: 13.6 % (ref 11.5–15.5)
WBC: 9.6 10*3/uL (ref 4.0–10.5)

## 2016-06-01 LAB — URINALYSIS, ROUTINE W REFLEX MICROSCOPIC
Glucose, UA: 250 mg/dL — AB
HGB URINE DIPSTICK: NEGATIVE
Ketones, ur: 15 mg/dL — AB
NITRITE: NEGATIVE
PH: 5.5 (ref 5.0–8.0)
Protein, ur: 30 mg/dL — AB
SPECIFIC GRAVITY, URINE: 1.031 — AB (ref 1.005–1.030)

## 2016-06-01 LAB — COMPREHENSIVE METABOLIC PANEL
ALT: 17 U/L (ref 14–54)
AST: 14 U/L — AB (ref 15–41)
Albumin: 3.8 g/dL (ref 3.5–5.0)
Alkaline Phosphatase: 58 U/L (ref 38–126)
Anion gap: 8 (ref 5–15)
BUN: 14 mg/dL (ref 6–20)
CHLORIDE: 105 mmol/L (ref 101–111)
CO2: 24 mmol/L (ref 22–32)
CREATININE: 0.74 mg/dL (ref 0.44–1.00)
Calcium: 9.2 mg/dL (ref 8.9–10.3)
GFR calc Af Amer: >60 mL/min (ref >60)
GFR calc non Af Amer: >60 mL/min (ref >60)
Glucose, Bld: 196 mg/dL — ABNORMAL HIGH (ref 65–99)
POTASSIUM: 3.5 mmol/L (ref 3.5–5.1)
SODIUM: 137 mmol/L (ref 135–145)
Total Bilirubin: 0.5 mg/dL (ref 0.3–1.2)
Total Protein: 6.9 g/dL (ref 6.5–8.1)

## 2016-06-01 LAB — URINE MICROSCOPIC-ADD ON: RBC / HPF: NONE SEEN RBC/hpf (ref 0–5)

## 2016-06-01 LAB — POC URINE PREG, ED: PREG TEST UR: NEGATIVE

## 2016-06-01 LAB — CBG MONITORING, ED: GLUCOSE-CAPILLARY: 184 mg/dL — AB (ref 65–99)

## 2016-06-01 NOTE — ED Triage Notes (Signed)
Pt. reports elevated blood pressure (175/83) and hyperglycemia ( 228 ) this evening , denies pain or discomfort . Alert and oriented/respirations unlabored .

## 2016-06-01 NOTE — ED Notes (Signed)
CBG 184  

## 2016-06-02 ENCOUNTER — Emergency Department (HOSPITAL_COMMUNITY)
Admission: EM | Admit: 2016-06-02 | Discharge: 2016-06-02 | Disposition: A | Discharge status: Home / Self Care | Payer: Commercial Managed Care - HMO | Attending: Emergency Medicine | Admitting: Emergency Medicine

## 2016-06-07 ENCOUNTER — Ambulatory Visit: Payer: Commercial Managed Care - HMO | Admitting: Family Medicine

## 2016-06-15 ENCOUNTER — Ambulatory Visit: Payer: Commercial Managed Care - HMO | Attending: Family Medicine | Admitting: Speech Pathology

## 2016-06-15 DIAGNOSIS — R41841 Cognitive communication deficit: Secondary | ICD-10-CM

## 2016-06-15 NOTE — Patient Instructions (Signed)
  Get a 3 ring binder - put a copy of her therapy schedule in it, medications, daily chore/exercise schedule  Write down anything she is asking repeatedly in binder    Cognitive Activities you can do at home:   - Solitaire  - Majong  - Scrabble  - Chess/Checkers  - Crosswords (easy level)  - Education officer, communityUno  - Card Games  - Board Games  - Connect 4  - Dentistimon  - the Chubb CorporationMemory Match Game  -Dominoes  -Jig Saw Puzzle

## 2016-06-15 NOTE — Therapy (Signed)
[facility] [address] [place], [place], [address] Phone: [phone]   Fax:  [phone]  Speech Language Pathology Evaluation  Patient Details  Name: Annette Munoz MRN: [id number] Date of Birth: 11-24-1965 Referring Provider: Dr. Renaye Rakers  Encounter Date: 06/15/2016      End of Session - 06/15/16 1509    Visit Number 1   Number of Visits 17   Date for SLP Re-Evaluation 08/10/16   SLP Start Time 1400   SLP Stop Time  1450   SLP Time Calculation (min) 50 min   Activity Tolerance Patient tolerated treatment well      Past Medical History:  Diagnosis Date  . Diabetes mellitus without complication (HCC)   . Hypertension   . Stroke [facility])     Past Surgical History:  Procedure Laterality Date  . CESAREAN SECTION    . TEE WITHOUT CARDIOVERSION N/A 08/10/2015   Procedure: TRANSESOPHAGEAL ECHOCARDIOGRAM (TEE);  Surgeon: Chrystie Nose, MD;  Location: [facility] ENDOSCOPY;  Service: Cardiovascular;  Laterality: N/A;    There were no vitals filed for this visit.      Subjective Assessment - 06/15/16 1416    Subjective "Her memory is really short now"   Patient is accompained by: Family member   Special Tests spouse, daughter            SLP Evaluation [facility] - 06/15/16 1418      SLP Visit Information   SLP Received On 06/15/16   Referring Provider Dr. Renaye Rakers   Onset Date 05/18/2016   Medical Diagnosis CVA     Subjective   Patient/Family Stated Goal To help her remember     General Information   HPI [name] is a 50 y.o. female with history of DM, HTN, obesity, HLD was admitted on 08/07/15 for AMS and amnesia. 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 Per MD note,  Pt continued to be noncompliant with recommendations. She did not take BP at home, nor checking glucose. Her medication ran out and has not picked up at pharmacy. She  did not call back for diabetic education calls. She did not do much healthy diet and home exercises.  Most recent admission 05/18/2016 - patient was admitted due to acute confusion and worsening memory loss. MRI showed right small thalamic infarct, superior than the first infarct. CUS and TTE unremarkable.. She had again diabetic education   Mobility Status Walks independently     Prior Functional Status   Cognitive/Linguistic Baseline Baseline deficits   Baseline deficit details attention, memory, problem solving   Type of Home House    Lives With Spouse;Family   Available Support Family   Vocation Part time employment     Cognition   Overall Cognitive Status Impaired/Different from baseline   Area of Impairment Orientation;Attention;Memory;Safety/judgement;Awareness;Problem solving   Current Attention Level Sustained   Memory Decreased recall of precautions;Decreased short-term memory   Safety/Judgement Decreased awareness of safety;Decreased awareness of deficits   Awareness Intellectual  impaired   Problem Solving Slow processing;Decreased initiation;Difficulty sequencing;Requires verbal cues   Attention Sustained   Sustained Attention Impaired   Sustained Attention Impairment Verbal basic;Functional basic   Memory Impaired   Memory Impairment Storage deficit;Decreased recall of new information;Decreased short term memory   Decreased Short Term Memory Verbal basic;Functional basic   Awareness Impaired   Awareness Impairment Intellectual impairment   Problem Solving Impaired   Problem Solving Impairment  Verbal basic;Functional basic   Astronomer;Sequencing;Organizing;Initiating;Decision Making;Self Monitoring;Self Correcting   Reasoning Impaired   Reasoning Impairment Verbal basic;Functional basic   Organizing Impaired   Organizing Impairment Verbal basic;Functional basic   Decision Making Impaired   Decision Making Impairment Verbal basic;Functional basic    Initiating Impaired   Initiating Impairment Verbal basic;Functional basic   Self Monitoring Impaired   Self Monitoring Impairment Verbal basic;Functional basic   Self Correcting Impaired   Self Correcting Impairment Verbal basic;Functional basic     Standardized Assessments   Standardized Assessments  Montreal Cognitive Assessment (MOCA)  15/30                         SLP Education - 06/15/16 1508    Education provided Yes   Education Details Cognitive activies to do at home, goals for therpay, necessity of following up at home with ST recommendations, diet, exercise and BP/blood sugar monitoring   Person(s) Educated Patient;Spouse;Child(ren)   Methods Explanation;Demonstration;Verbal cues;Handout   Comprehension Need further instruction          SLP Short Term Goals - 06/15/16 1524      SLP SHORT TERM GOAL #1   Title Pt will utilize external aids/memory book to be oriented to date, appointments and daily chores with occasional min A over 3 sessions   Time 4   Period Weeks   Status New     SLP SHORT TERM GOAL #2   Title Pt/family will report use of external aids/memory book at home for ADL's and medication mangement over 3 sessions   Time 4   Period Weeks   Status New     SLP SHORT TERM GOAL #3   Title Pt will verbalize 3 cognitive impairments with mod A   Time 4   Period Weeks   Status New     SLP SHORT TERM GOAL #4   Title Pt will attend to simple cognitive linguistic tasks in distracting enviroment for 10 mintues with occasional min A   Time 4   Period Weeks   Status New          SLP Long Term Goals - 06/15/16 1528      SLP LONG TERM GOAL #1   Title Pt/family will report use of external memory aids at home for successful f/u on daily schedule, safety precautions, chores, meds, etc over 5 sessions.   Time 8   Period Weeks   Status New     SLP LONG TERM GOAL #2   Title Pt will verbalize 3 safety precautions due to cognitive  impairments with mod A.    Time 8   Period Weeks   Status New     SLP LONG TERM GOAL #3   Title Pt will solve simple reasoning, organization, time and money problems with 75% accuracy and occasional min A   Time 8   Period Weeks   Status New          Plan - 06/15/16 1510    Clinical Impression Statement Annette Munoz, a 50 y.o. female is s/p her 2nd CVA on 05/18/16. She is know to [place] from prior ST from CVA last October 2016. Duuring her last course of ST, significant attention, concentration, awareness impairments resulted in reduced follow through with ST homework and implementing compensations at home to support her reduced cognition.   At this time, pt presents with severe cognitive linguistic deficits in sustained and selective attention and memory. She was  oriented to year and month, but not to the date. She was surprised to hear that Trump is president. When asked who was president 2 other times during this evaluation, she could not recall and was surprised each time to hear Trump is president. She recalled 4/5 words immediately on the [name] (MoCA), and 0/5 with a delay. Poor attention affected sentence repetition, verbal fluency and letter tap task. Organization and sequencing or letter to number trail is impaired with poor error awareness after I repeated the directions.  After being oriented to the date twice by her family, she did not recall the date on check writng task. She found the date on her phone (31st) and wrote Aug "3" on her check, not attending to the error. Annette Munoz does not verbalize intellectual awareness of her cognitive impairments, and has driven against MD orders. 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 I educated her and her family that she should not be working or driving due to cognitive deficits,  (she is a Producer, television/film/videohair dresser).  I recommend skilled ST to maximize cognition and train family on compensations to support Annette Munoz's safety  and memory at home.    Speech Therapy Frequency 2x / week   Duration --  8 weeks or 17 visits   Treatment/Interventions Compensatory strategies;Patient/family education;Functional tasks;Cueing hierarchy;Internal/external aids;Cognitive reorganization;SLP instruction and feedback   Potential to Achieve Goals Fair   Potential Considerations Severity of impairments;Previous level of function   Consulted and Agree with Plan of Care Patient;Family member/caregiver   Family Member Consulted spouse, daughter      Patient will benefit from skilled therapeutic intervention in order to improve the following deficits and impairments:   Cognitive communication deficit - Plan: SLP plan of care cert/re-cert    Problem List Patient Active Problem List   Diagnosis Date Noted  . Memory loss 05/22/2016  . Confusion   . Acute ischemic stroke (HCC) 05/18/2016  . Morbid obesity (HCC) 09/30/2015  . Uncontrolled type 2 diabetes mellitus with circulatory disorder, without long-term current use of insulin (HCC)   . Essential hypertension   . Cerebrovascular accident (CVA) due to thrombosis of cerebral artery (HCC)   . CVA (cerebral infarction) 08/07/2015  . Pure hypercholesterolemia 10/04/2009    Yamil Dougher, Radene JourneyLaura Ann MS, CCC-SLP 06/15/2016, 3:38 PM  [facility] [address] [place], [name], [address] Phone: [phone]   Fax:  [phone]  Name: Salena SanerBetty H Munoz MRN: [id number] Date of Birth: 10/14/1966

## 2016-06-17 ENCOUNTER — Other Ambulatory Visit: Payer: Self-pay | Admitting: Neurology

## 2016-06-17 DIAGNOSIS — R413 Other amnesia: Secondary | ICD-10-CM

## 2016-06-19 ENCOUNTER — Emergency Department (HOSPITAL_COMMUNITY): Payer: Commercial Managed Care - HMO

## 2016-06-19 ENCOUNTER — Encounter (HOSPITAL_COMMUNITY): Payer: Self-pay | Admitting: *Deleted

## 2016-06-19 ENCOUNTER — Inpatient Hospital Stay (HOSPITAL_COMMUNITY)
Admission: EM | Admit: 2016-06-19 | Discharge: 2016-06-21 | DRG: 065 | Disposition: A | Discharge status: Home / Self Care | Payer: Commercial Managed Care - HMO | Attending: Internal Medicine | Admitting: Internal Medicine

## 2016-06-19 DIAGNOSIS — I63511 Cerebral infarction due to unspecified occlusion or stenosis of right middle cerebral artery: Secondary | ICD-10-CM | POA: Diagnosis not present

## 2016-06-19 DIAGNOSIS — N179 Acute kidney failure, unspecified: Secondary | ICD-10-CM | POA: Diagnosis present

## 2016-06-19 DIAGNOSIS — D649 Anemia, unspecified: Secondary | ICD-10-CM | POA: Diagnosis present

## 2016-06-19 DIAGNOSIS — Z7902 Long term (current) use of antithrombotics/antiplatelets: Secondary | ICD-10-CM

## 2016-06-19 DIAGNOSIS — I6300 Cerebral infarction due to thrombosis of unspecified precerebral artery: Secondary | ICD-10-CM | POA: Diagnosis not present

## 2016-06-19 DIAGNOSIS — Z8249 Family history of ischemic heart disease and other diseases of the circulatory system: Secondary | ICD-10-CM | POA: Diagnosis not present

## 2016-06-19 DIAGNOSIS — I10 Essential (primary) hypertension: Secondary | ICD-10-CM

## 2016-06-19 DIAGNOSIS — Z7982 Long term (current) use of aspirin: Secondary | ICD-10-CM | POA: Diagnosis not present

## 2016-06-19 DIAGNOSIS — I639 Cerebral infarction, unspecified: Principal | ICD-10-CM | POA: Diagnosis present

## 2016-06-19 DIAGNOSIS — Z79899 Other long term (current) drug therapy: Secondary | ICD-10-CM

## 2016-06-19 DIAGNOSIS — E860 Dehydration: Secondary | ICD-10-CM | POA: Diagnosis present

## 2016-06-19 DIAGNOSIS — I6330 Cerebral infarction due to thrombosis of unspecified cerebral artery: Secondary | ICD-10-CM

## 2016-06-19 DIAGNOSIS — W19XXXA Unspecified fall, initial encounter: Secondary | ICD-10-CM | POA: Diagnosis present

## 2016-06-19 DIAGNOSIS — E7800 Pure hypercholesterolemia, unspecified: Secondary | ICD-10-CM | POA: Diagnosis present

## 2016-06-19 DIAGNOSIS — E1165 Type 2 diabetes mellitus with hyperglycemia: Secondary | ICD-10-CM

## 2016-06-19 DIAGNOSIS — R269 Unspecified abnormalities of gait and mobility: Secondary | ICD-10-CM | POA: Diagnosis present

## 2016-06-19 DIAGNOSIS — E119 Type 2 diabetes mellitus without complications: Secondary | ICD-10-CM | POA: Diagnosis present

## 2016-06-19 DIAGNOSIS — E876 Hypokalemia: Secondary | ICD-10-CM | POA: Diagnosis present

## 2016-06-19 DIAGNOSIS — R413 Other amnesia: Secondary | ICD-10-CM | POA: Diagnosis not present

## 2016-06-19 DIAGNOSIS — I672 Cerebral atherosclerosis: Secondary | ICD-10-CM | POA: Diagnosis present

## 2016-06-19 DIAGNOSIS — M25561 Pain in right knee: Secondary | ICD-10-CM | POA: Diagnosis present

## 2016-06-19 DIAGNOSIS — Z833 Family history of diabetes mellitus: Secondary | ICD-10-CM

## 2016-06-19 DIAGNOSIS — Z8673 Personal history of transient ischemic attack (TIA), and cerebral infarction without residual deficits: Secondary | ICD-10-CM | POA: Diagnosis not present

## 2016-06-19 DIAGNOSIS — Z888 Allergy status to other drugs, medicaments and biological substances status: Secondary | ICD-10-CM

## 2016-06-19 DIAGNOSIS — E1151 Type 2 diabetes mellitus with diabetic peripheral angiopathy without gangrene: Secondary | ICD-10-CM

## 2016-06-19 DIAGNOSIS — Z823 Family history of stroke: Secondary | ICD-10-CM

## 2016-06-19 DIAGNOSIS — E785 Hyperlipidemia, unspecified: Secondary | ICD-10-CM | POA: Diagnosis present

## 2016-06-19 DIAGNOSIS — I6501 Occlusion and stenosis of right vertebral artery: Secondary | ICD-10-CM | POA: Diagnosis present

## 2016-06-19 DIAGNOSIS — Z7984 Long term (current) use of oral hypoglycemic drugs: Secondary | ICD-10-CM

## 2016-06-19 DIAGNOSIS — Z6841 Body Mass Index (BMI) 40.0 and over, adult: Secondary | ICD-10-CM

## 2016-06-19 LAB — COMPREHENSIVE METABOLIC PANEL
ALT: 17 U/L (ref 14–54)
AST: 16 U/L (ref 15–41)
Albumin: 3.9 g/dL (ref 3.5–5.0)
Alkaline Phosphatase: 49 U/L (ref 38–126)
Anion gap: 11 (ref 5–15)
BUN: 35 mg/dL — ABNORMAL HIGH (ref 6–20)
CO2: 22 mmol/L (ref 22–32)
Calcium: 8.1 mg/dL — ABNORMAL LOW (ref 8.9–10.3)
Chloride: 102 mmol/L (ref 101–111)
Creatinine, Ser: 1.48 mg/dL — ABNORMAL HIGH (ref 0.44–1.00)
GFR calc Af Amer: 47 mL/min — ABNORMAL LOW (ref >60)
GFR calc non Af Amer: 40 mL/min — ABNORMAL LOW (ref >60)
Glucose, Bld: 125 mg/dL — ABNORMAL HIGH (ref 65–99)
Potassium: 3 mmol/L — ABNORMAL LOW (ref 3.5–5.1)
Sodium: 135 mmol/L (ref 135–145)
Total Bilirubin: 1.3 mg/dL — ABNORMAL HIGH (ref 0.3–1.2)
Total Protein: 6.9 g/dL (ref 6.5–8.1)

## 2016-06-19 LAB — URINALYSIS, ROUTINE W REFLEX MICROSCOPIC
Glucose, UA: >1000 mg/dL — AB
Hgb urine dipstick: NEGATIVE
Ketones, ur: NEGATIVE mg/dL
Leukocytes, UA: NEGATIVE
Nitrite: NEGATIVE
Protein, ur: 30 mg/dL — AB
Specific Gravity, Urine: 1.027 (ref 1.005–1.030)
pH: 5 (ref 5.0–8.0)

## 2016-06-19 LAB — CBC WITH DIFFERENTIAL/PLATELET
Basophils Absolute: 0.1 10*3/uL (ref 0.0–0.1)
Basophils Relative: 1 %
Eosinophils Absolute: 0.1 10*3/uL (ref 0.0–0.7)
Eosinophils Relative: 2 %
HCT: 34.9 % — ABNORMAL LOW (ref 36.0–46.0)
Hemoglobin: 11.5 g/dL — ABNORMAL LOW (ref 12.0–15.0)
Lymphocytes Relative: 23 %
Lymphs Abs: 1.6 10*3/uL (ref 0.7–4.0)
MCH: 31.2 pg (ref 26.0–34.0)
MCHC: 33 g/dL (ref 30.0–36.0)
MCV: 94.6 fL (ref 78.0–100.0)
Monocytes Absolute: 0.5 10*3/uL (ref 0.1–1.0)
Monocytes Relative: 8 %
Neutro Abs: 4.7 10*3/uL (ref 1.7–7.7)
Neutrophils Relative %: 66 %
Platelets: 434 10*3/uL — ABNORMAL HIGH (ref 150–400)
RBC: 3.69 MIL/uL — ABNORMAL LOW (ref 3.87–5.11)
RDW: 13.4 % (ref 11.5–15.5)
WBC: 7 10*3/uL (ref 4.0–10.5)

## 2016-06-19 LAB — RAPID URINE DRUG SCREEN, HOSP PERFORMED
Amphetamines: NOT DETECTED
Barbiturates: NOT DETECTED
Benzodiazepines: NOT DETECTED
Cocaine: NOT DETECTED
Opiates: NOT DETECTED
Tetrahydrocannabinol: NOT DETECTED

## 2016-06-19 LAB — URINE MICROSCOPIC-ADD ON

## 2016-06-19 LAB — PROTIME-INR
INR: 1.08
PROTHROMBIN TIME: 14 s (ref 11.4–15.2)

## 2016-06-19 LAB — GLUCOSE, CAPILLARY
GLUCOSE-CAPILLARY: 80 mg/dL (ref 65–99)
Glucose-Capillary: 107 mg/dL — ABNORMAL HIGH (ref 65–99)

## 2016-06-19 LAB — TSH: TSH: 1.171 u[IU]/mL (ref 0.350–4.500)

## 2016-06-19 LAB — ETHANOL: Alcohol, Ethyl (B): <5 mg/dL (ref <5)

## 2016-06-19 MED ORDER — ASPIRIN 325 MG PO TABS
325.0000 mg | ORAL_TABLET | Freq: Every day | ORAL | Status: DC
  Administered 2016-06-19 – 2016-06-21 (×3): 325 mg via ORAL
  Filled 2016-06-19 (×3): qty 1

## 2016-06-19 MED ORDER — POTASSIUM CHLORIDE 10 MEQ/100ML IV SOLN
10.0000 meq | INTRAVENOUS | Status: AC
  Administered 2016-06-19 (×2): 10 meq via INTRAVENOUS
  Filled 2016-06-19 (×2): qty 100

## 2016-06-19 MED ORDER — DONEPEZIL HCL 5 MG PO TABS
5.0000 mg | ORAL_TABLET | Freq: Every day | ORAL | Status: DC
  Administered 2016-06-19 – 2016-06-20 (×2): 5 mg via ORAL
  Filled 2016-06-19 (×2): qty 1

## 2016-06-19 MED ORDER — INSULIN GLARGINE 100 UNIT/ML ~~LOC~~ SOLN
12.0000 [IU] | Freq: Every day | SUBCUTANEOUS | Status: DC
  Administered 2016-06-19 – 2016-06-21 (×3): 12 [IU] via SUBCUTANEOUS
  Filled 2016-06-19 (×3): qty 0.12

## 2016-06-19 MED ORDER — POLYETHYLENE GLYCOL 3350 17 G PO PACK: 17.0000 g | PACK | Freq: Every day | ORAL | Status: DC | PRN

## 2016-06-19 MED ORDER — INSULIN ASPART 100 UNIT/ML ~~LOC~~ SOLN
0.0000 [IU] | Freq: Three times a day (TID) | SUBCUTANEOUS | Status: DC
  Administered 2016-06-20 – 2016-06-21 (×4): 1 [IU] via SUBCUTANEOUS

## 2016-06-19 MED ORDER — ENOXAPARIN SODIUM 40 MG/0.4ML ~~LOC~~ SOLN
40.0000 mg | SUBCUTANEOUS | Status: DC
Start: 2016-06-19 — End: 2016-06-21
  Administered 2016-06-19 – 2016-06-20 (×2): 40 mg via SUBCUTANEOUS
  Filled 2016-06-19 (×2): qty 0.4

## 2016-06-19 MED ORDER — POTASSIUM CHLORIDE IN NACL 40-0.9 MEQ/L-% IV SOLN
INTRAVENOUS | Status: DC
  Administered 2016-06-19: 100 mL/h via INTRAVENOUS
  Filled 2016-06-19 (×3): qty 1000

## 2016-06-19 MED ORDER — ATORVASTATIN CALCIUM 80 MG PO TABS
80.0000 mg | ORAL_TABLET | Freq: Every day | ORAL | Status: DC
  Administered 2016-06-20: 80 mg via ORAL
  Filled 2016-06-19 (×3): qty 1

## 2016-06-19 MED ORDER — ACETAMINOPHEN 325 MG PO TABS
650.0000 mg | ORAL_TABLET | Freq: Four times a day (QID) | ORAL | Status: DC | PRN
  Administered 2016-06-19: 650 mg via ORAL
  Filled 2016-06-19: qty 2

## 2016-06-19 MED ORDER — CLOPIDOGREL BISULFATE 75 MG PO TABS
75.0000 mg | ORAL_TABLET | Freq: Every day | ORAL | Status: DC
  Administered 2016-06-19 – 2016-06-21 (×3): 75 mg via ORAL
  Filled 2016-06-19 (×3): qty 1

## 2016-06-19 MED ORDER — SENNOSIDES-DOCUSATE SODIUM 8.6-50 MG PO TABS: 1.0000 | ORAL_TABLET | Freq: Every evening | ORAL | Status: DC | PRN

## 2016-06-19 MED ORDER — GADOBENATE DIMEGLUMINE 529 MG/ML IV SOLN
20.0000 mL | Freq: Once | INTRAVENOUS | Status: AC
  Administered 2016-06-19: 18 mL via INTRAVENOUS

## 2016-06-19 MED ORDER — STROKE: EARLY STAGES OF RECOVERY BOOK
Freq: Once | Status: AC
  Administered 2016-06-19: 18:00:00
  Filled 2016-06-19: qty 1

## 2016-06-19 MED ORDER — ALBUTEROL SULFATE (2.5 MG/3ML) 0.083% IN NEBU: 2.5000 mg | INHALATION_SOLUTION | RESPIRATORY_TRACT | Status: DC | PRN

## 2016-06-19 MED ORDER — SODIUM CHLORIDE 0.9% FLUSH
3.0000 mL | Freq: Two times a day (BID) | INTRAVENOUS | Status: DC
  Administered 2016-06-20: 3 mL via INTRAVENOUS

## 2016-06-19 MED ORDER — SODIUM CHLORIDE 0.9 % IV BOLUS (SEPSIS)
500.0000 mL | Freq: Once | INTRAVENOUS | Status: AC
  Administered 2016-06-19: 500 mL via INTRAVENOUS

## 2016-06-19 MED ORDER — ONDANSETRON HCL 4 MG PO TABS: 4.0000 mg | ORAL_TABLET | Freq: Four times a day (QID) | ORAL | Status: DC | PRN

## 2016-06-19 MED ORDER — ONDANSETRON HCL 4 MG/2ML IJ SOLN: 4.0000 mg | Freq: Four times a day (QID) | INTRAMUSCULAR | Status: DC | PRN

## 2016-06-19 NOTE — ED Triage Notes (Signed)
Pt here from home with family for increasing weakness, nausea and falls since Thursday.  Pt had syncopal episode with vomiting in restaurant.  Pt already has memory loss from recent strokes, but has not been falling like she has been since Thursday.   Family states pt has been vomiting and had a decreased appetite since starting Glyx[employer]i last Friday.

## 2016-06-19 NOTE — ED Notes (Signed)
PT appears to be more alert some times vs others.  Initially was not aware that it was 2007 and she thought she was 46.  She was corrected and several hours later she was able to states the correct year and age without prompting.

## 2016-06-19 NOTE — ED Notes (Signed)
Patient transported to MRI 

## 2016-06-19 NOTE — H&P (Signed)
TRH H&P   Patient Demographics:    Annette Munoz, is a 50 y.o. female  MRN: [id number]   DOB - 1966-01-23  Admit Date - 06/19/2016  Outpatient Primary MD for the patient is Elyn Peers, MD  Outpatient Specialists: Dr Erlinda Hong    Patient coming from: Home  Chief Complaint  Patient presents with  . Weakness  . Fall      HPI:    Annette Munoz  is a 50 y.o. female, with history of DM type II, hypertension, morbid obesity, essential hypertension who waswas admitted 5-6 weeks ago for stroke,comes in with weight complaints ongoing for 4-5 days of generalized weakness, poor balance falls at home,apparently she was recently started on Glucophage, ARB/HCTZ, Glucotrol by PCP about one week ago, according to the family members they went to a restaurant last Thursday, where while sitting and having dinner patient, threw up and then fell, she passed out for a few seconds and also urinated on herself, she was cleared by EMS sent home, she went home and a day later fell again while walking to the bathroom, this morning she was acting confused and fell again while sitting on the toilet seat.  She was then brought to the ER where her workup showed a new small acute infarct on the MRI scan, she was seen by neurology and we were requested to admit the patient for stroke workup. Patient currently is symptom free.    Review of systems:    In addition to the HPI above,   No Fever-chills, No Headache, No changes with Vision or hearing, No problems swallowing food or Liquids, No Chest pain, Cough or Shortness of Breath, No Abdominal pain, No Nausea or Vommitting, Bowel movements are regular, No Blood in stool or Urine, No dysuria, No new  skin rashes or bruises, No new joints pains-aches,  No new weakness, tingling, numbness in any extremity, No recent weight gain or loss, No polyuria, polydypsia or polyphagia, No significant Mental Stressors.  A full 10 point Review of Systems was done, except as stated above, all other Review of Systems were negative.   With Past History of the following :    Past Medical History:  Diagnosis Date  . Diabetes mellitus without complication ([place])   . Hypertension   . Stroke [facility])  Past Surgical History:  Procedure Laterality Date  . CESAREAN SECTION    . TEE WITHOUT CARDIOVERSION N/A 08/10/2015   Procedure: TRANSESOPHAGEAL ECHOCARDIOGRAM (TEE);  Surgeon: [name], MD;  Location: [facility] ENDOSCOPY;  Service: Cardiovascular;  Laterality: N/A;      Social History:     Social History  Substance Use Topics  . Smoking status: Never Smoker  . Smokeless tobacco: Never Used  . Alcohol use No     Comment: occasionally         Family History :     Family History  Problem Relation Age of Onset  . Diabetes Mother   . Heart disease Mother   . Hypertension Mother   . Hypertension Father   . Stroke Father   . Stroke Maternal Grandmother        Home Medications:   Prior to Admission medications   Medication Sig Start Date End Date Taking? Authorizing Provider  amLODipine (NORVASC) 10 MG tablet Take 1 tablet (10 mg total) by mouth daily. 05/23/16  Yes Rosalin Hawking, MD  aspirin 325 MG tablet Take 1 tablet (325 mg total) by mouth daily. 12/20/15  Yes Rosalin Hawking, MD  atorvastatin (LIPITOR) 80 MG tablet TAKE 1 TABLET (80 MG TOTAL) BY MOUTH DAILY AT 6 PM. 12/20/15  Yes Rosalin Hawking, MD  clopidogrel (PLAVIX) 75 MG tablet Take 1 tablet (75 mg total) by mouth daily. 05/21/16  Yes Velvet Bathe, MD  donepezil (ARICEPT) 5 MG tablet Take 1 tablet (5 mg total) by mouth at bedtime. 05/22/16  Yes Rosalin Hawking, MD  glipiZIDE (GLUCOTROL XL) 5 MG 24 hr tablet Take 1 tablet (5 mg total) by mouth  daily with breakfast. 05/23/16  Yes Rosalin Hawking, MD  metFORMIN (GLUCOPHAGE XR) 500 MG 24 hr tablet Take 2 tablets (1,000 mg total) by mouth 2 (two) times daily. 08/12/15  Yes Shanker Kristeen Mans, MD  valsartan-hydrochlorothiazide (DIOVAN-HCT) 320-12.5 MG tablet Take 1 tablet by mouth every morning.   Yes Historical Provider, MD  Alcohol Swabs (ALCOHOL PREP) PADS Use to check blood sugar daily. E11.65 09/16/15   Mancel Bale, PA-C  blood glucose meter kit and supplies KIT Use daily to check blood sugar. E11.65 09/16/15   Mancel Bale, PA-C  Blood Pressure Monitoring (PREMIUM AUTOMATIC BP MONITOR) DEVI 1 Device by Does not apply route daily. 08/17/15   Mancel Bale, PA-C  glucose blood test strip Use to check blood sugar daily. E11.65 09/16/15   Mancel Bale, PA-C  Lancet Devices (LANCING DEVICE) MISC Use daily to check blood sugar. E11.65 09/16/15   Mancel Bale, PA-C  Lancets MISC Use to check blood sugar daily. E11.65 09/16/15   Mancel Bale, PA-C     Allergies:     Allergies  Allergen Reactions  . Glyxambi [Empagliflozin-Linagliptin] Nausea And Vomiting    Makes PT VERY SICK  . Lisinopril Cough     Physical Exam:   Vitals  Blood pressure 125/55, pulse 75, temperature 98.6 F (37 C), resp. rate 15, height 5' 2"  (1.575 m), weight 108.9 kg (240 lb), last menstrual period 06/11/2016, SpO2 99 %.   1. General OBESE middle-aged  lying in bed in NAD,    2. Normal affect and insight, Not Suicidal or Homicidal, Awake Alert, Oriented X 3.  3. No F.N deficits, ALL C.Nerves Intact, Strength 5/5 all 4 extremities, Sensation intact all 4 extremities, Plantars down going.  4. Ears and Eyes appear Normal, Conjunctivae clear, PERRLA. Moist Oral Mucosa.  5. Supple Neck, No JVD, No cervical lymphadenopathy appriciated, No Carotid Bruits.  6. Symmetrical Chest wall movement, Good air movement bilaterally, CTAB.  7. RRR, No Gallops, Rubs or Murmurs, No Parasternal Heave.  8. Positive Bowel Sounds,  Abdomen Soft, No tenderness, No organomegaly appriciated,No rebound -guarding or rigidity.  9.  No Cyanosis, Normal Skin Turgor, No Skin Rash or Bruise.  10. Good muscle tone,  joints appear normal , no effusions, Normal ROM.  11. No Palpable Lymph Nodes in Neck or Axillae      Data Review:    CBC  Recent Labs Lab 06/19/16 1039  WBC 7.0  HGB 11.5*  HCT 34.9*  PLT 434*  MCV 94.6  MCH 31.2  MCHC 33.0  RDW 13.4  LYMPHSABS 1.6  MONOABS 0.5  EOSABS 0.1  BASOSABS 0.1   ------------------------------------------------------------------------------------------------------------------  Chemistries   Recent Labs Lab 06/19/16 1039  NA 135  K 3.0*  CL 102  CO2 22  GLUCOSE 125*  BUN 35*  CREATININE 1.48*  CALCIUM 8.1*  AST 16  ALT 17  ALKPHOS 49  BILITOT 1.3*   Lab Results  Component Value Date   HGBA1C 11.1 (H) 05/19/2016    ------------------------------------------------------------------------------------------------------------------ estimated creatinine clearance is 53.4 mL/min (by C-G formula based on SCr of 1.48 mg/dL). ------------------------------------------------------------------------------------------------------------------ No results for input(s): TSH, T4TOTAL, T3FREE, THYROIDAB in the last 72 hours.  Invalid input(s): FREET3  Coagulation profile No results for input(s): INR, PROTIME in the last 168 hours. ------------------------------------------------------------------------------------------------------------------- No results for input(s): DDIMER in the last 72 hours. -------------------------------------------------------------------------------------------------------------------  Cardiac Enzymes No results for input(s): CKMB, TROPONINI, MYOGLOBIN in the last 168 hours.  Invalid input(s): CK ------------------------------------------------------------------------------------------------------------------ No results found for:  BNP   ---------------------------------------------------------------------------------------------------------------  Urinalysis    Component Value Date/Time   COLORURINE YELLOW 06/19/2016 [place] 06/19/2016 1116   LABSPEC 1.027 06/19/2016 1116   PHURINE 5.0 06/19/2016 1116   GLUCOSEU >1000 (A) 06/19/2016 1116   GLUCOSEU >=1000 08/04/2010 0833   HGBUR NEGATIVE 06/19/2016 1116   BILIRUBINUR SMALL (A) 06/19/2016 1116   BILIRUBINUR neg 03/22/2015 1032   KETONESUR NEGATIVE 06/19/2016 1116   PROTEINUR 30 (A) 06/19/2016 1116   UROBILINOGEN 0.2 08/07/2015 1754   NITRITE NEGATIVE 06/19/2016 1116   [place] 06/19/2016 1116    ----------------------------------------------------------------------------------------------------------------   Imaging Results:    Mr Jodene Nam Head Wo Contrast  Result Date: 06/19/2016 CLINICAL DATA:  Gait difficulty. Increasing weakness, nausea, and false. Syncopal episode with vomiting. EXAM: MRI HEAD WITHOUT CONTRAST MRA HEAD WITHOUT CONTRAST MRA NECK WITHOUT AND WITH CONTRAST TECHNIQUE: Multiplanar, multiecho pulse sequences of the brain and surrounding structures were obtained without intravenous contrast. Angiographic images of the Circle of Willis were obtained using MRA technique without intravenous contrast. Angiographic images of the neck were obtained using MRA technique without and with intravenous contrast. Carotid stenosis measurements (when applicable) are obtained utilizing NASCET criteria, using the distal internal carotid diameter as the denominator. CONTRAST:  78m MULTIHANCE GADOBENATE DIMEGLUMINE 529 MG/ML IV SOLN COMPARISON:  Head MRI/ MRA 05/18/2016 FINDINGS: MRI HEAD FINDINGS There are multiple clustered subcentimeter foci of restricted diffusion along the lateral margin of the left thalamus predominantly involving the posterior limb of internal capsule. No residual diffusion abnormality is identified at the site of the  right anterior thalamus/ genu of internal capsule infarct on the prior MRI. There may be minimal chronic blood products associated with this now old infarct. Chronic microhemorrhage in the right putamen and chronic microhemorrhage versus vascular susceptibility artifact in  the left putamen are unchanged. Chronic infarct and associated chronic blood products in the right cerebral peduncle are unchanged. Ventricles and sulci are normal. Multiple small chronic left ACA territory infarcts are again seen involving the anterior corpus callosum, cingulate gyrus, and posterior frontal lobe. There is no mass, midline shift, or extra-axial fluid collection. Orbits are unremarkable. No significant inflammatory changes are seen in the paranasal sinuses are mastoid air cells. Major intracranial vascular flow voids are preserved, with the left vertebral artery being dominant. MRA HEAD FINDINGS The visualized distal vertebral arteries are patent to the basilar with the left being strongly dominant. There is irregularity and mild narrowing of the right V4 segment. Left PICA, bilateral AICA, and bilateral SCA origins are patent. Right AICA appears dominant. Basilar artery is patent without stenosis. PCAs are patent with mild right P1 stenosis and similar appearance of moderate to severe proximal P2 and more distal stenoses bilaterally. Stump of a vessel extending anteriorly from the left PCA near the P1 - P2 junction is unchanged and may reflect a proximally occluded posterior communicating artery. The internal carotid arteries are patent from skullbase to carotid termini. Mild narrowing of the left ICA at the level of the anterior genu is unchanged. MCAs are patent without evidence of major branch occlusion. No right M1 mass or right MCA bifurcation stenosis. Mild-to-moderate stenoses involving the left M1 segment are unchanged. ACAs are patent with an unchanged severe proximal left A1 stenosis. Severe bilateral A2 stenoses are  also similar to the prior study. No intracranial aneurysm is identified. MRA NECK FINDINGS Three vessel aortic arch. Brachiocephalic and subclavian arteries appear widely patent. Cervical carotid arteries are patent bilaterally with mild irregularity in the proximal right ICA but no stenosis. The vertebral arteries are patent with antegrade flow bilaterally. The left vertebral artery is strongly dominant and without significant stenosis. There is moderate stenosis of the proximal right vertebral artery. Evaluation of the right V2 segment on the postcontrast MRA is limited due to its small size and adjacent venous enhancement. There is the suggestion of areas of high-grade stenosis or segmental occlusion of the right V2 segment on the contrast-enhanced MRA, however this is felt to be artifactual as the noncontrast time-of-flight MRA shows the vessel to be patent with mild irregularity and only mild narrowing. IMPRESSION: 1. Small acute infarcts primarily involving the posterior limb of the left internal capsule. 2. Unchanged chronic infarcts as above. 3. Unchanged advanced intracranial atherosclerosis. No major vessel occlusion. 4. No cervical carotid artery stenosis. 5. Widely patent and strongly dominant left vertebral artery. 6. Moderate proximal right vertebral artery stenosis. Electronically Signed   By: Logan Bores M.D.   On: 06/19/2016 13:16   Mr Angiogram Neck W Or Wo Contrast  Result Date: 06/19/2016 CLINICAL DATA:  Gait difficulty. Increasing weakness, nausea, and false. Syncopal episode with vomiting. EXAM: MRI HEAD WITHOUT CONTRAST MRA HEAD WITHOUT CONTRAST MRA NECK WITHOUT AND WITH CONTRAST TECHNIQUE: Multiplanar, multiecho pulse sequences of the brain and surrounding structures were obtained without intravenous contrast. Angiographic images of the Circle of Willis were obtained using MRA technique without intravenous contrast. Angiographic images of the neck were obtained using MRA technique without  and with intravenous contrast. Carotid stenosis measurements (when applicable) are obtained utilizing NASCET criteria, using the distal internal carotid diameter as the denominator. CONTRAST:  59m MULTIHANCE GADOBENATE DIMEGLUMINE 529 MG/ML IV SOLN COMPARISON:  Head MRI/ MRA 05/18/2016 FINDINGS: MRI HEAD FINDINGS There are multiple clustered subcentimeter foci of restricted diffusion along the lateral  margin of the left thalamus predominantly involving the posterior limb of internal capsule. No residual diffusion abnormality is identified at the site of the right anterior thalamus/ genu of internal capsule infarct on the prior MRI. There may be minimal chronic blood products associated with this now old infarct. Chronic microhemorrhage in the right putamen and chronic microhemorrhage versus vascular susceptibility artifact in the left putamen are unchanged. Chronic infarct and associated chronic blood products in the right cerebral peduncle are unchanged. Ventricles and sulci are normal. Multiple small chronic left ACA territory infarcts are again seen involving the anterior corpus callosum, cingulate gyrus, and posterior frontal lobe. There is no mass, midline shift, or extra-axial fluid collection. Orbits are unremarkable. No significant inflammatory changes are seen in the paranasal sinuses are mastoid air cells. Major intracranial vascular flow voids are preserved, with the left vertebral artery being dominant. MRA HEAD FINDINGS The visualized distal vertebral arteries are patent to the basilar with the left being strongly dominant. There is irregularity and mild narrowing of the right V4 segment. Left PICA, bilateral AICA, and bilateral SCA origins are patent. Right AICA appears dominant. Basilar artery is patent without stenosis. PCAs are patent with mild right P1 stenosis and similar appearance of moderate to severe proximal P2 and more distal stenoses bilaterally. Stump of a vessel extending anteriorly from  the left PCA near the P1 - P2 junction is unchanged and may reflect a proximally occluded posterior communicating artery. The internal carotid arteries are patent from skullbase to carotid termini. Mild narrowing of the left ICA at the level of the anterior genu is unchanged. MCAs are patent without evidence of major branch occlusion. No right M1 mass or right MCA bifurcation stenosis. Mild-to-moderate stenoses involving the left M1 segment are unchanged. ACAs are patent with an unchanged severe proximal left A1 stenosis. Severe bilateral A2 stenoses are also similar to the prior study. No intracranial aneurysm is identified. MRA NECK FINDINGS Three vessel aortic arch. Brachiocephalic and subclavian arteries appear widely patent. Cervical carotid arteries are patent bilaterally with mild irregularity in the proximal right ICA but no stenosis. The vertebral arteries are patent with antegrade flow bilaterally. The left vertebral artery is strongly dominant and without significant stenosis. There is moderate stenosis of the proximal right vertebral artery. Evaluation of the right V2 segment on the postcontrast MRA is limited due to its small size and adjacent venous enhancement. There is the suggestion of areas of high-grade stenosis or segmental occlusion of the right V2 segment on the contrast-enhanced MRA, however this is felt to be artifactual as the noncontrast time-of-flight MRA shows the vessel to be patent with mild irregularity and only mild narrowing. IMPRESSION: 1. Small acute infarcts primarily involving the posterior limb of the left internal capsule. 2. Unchanged chronic infarcts as above. 3. Unchanged advanced intracranial atherosclerosis. No major vessel occlusion. 4. No cervical carotid artery stenosis. 5. Widely patent and strongly dominant left vertebral artery. 6. Moderate proximal right vertebral artery stenosis. Electronically Signed   By: Logan Bores M.D.   On: 06/19/2016 13:16   Mr Brain Wo  Contrast  Result Date: 06/19/2016 CLINICAL DATA:  Gait difficulty. Increasing weakness, nausea, and false. Syncopal episode with vomiting. EXAM: MRI HEAD WITHOUT CONTRAST MRA HEAD WITHOUT CONTRAST MRA NECK WITHOUT AND WITH CONTRAST TECHNIQUE: Multiplanar, multiecho pulse sequences of the brain and surrounding structures were obtained without intravenous contrast. Angiographic images of the Circle of Willis were obtained using MRA technique without intravenous contrast. Angiographic images of the neck were  obtained using MRA technique without and with intravenous contrast. Carotid stenosis measurements (when applicable) are obtained utilizing NASCET criteria, using the distal internal carotid diameter as the denominator. CONTRAST:  54m MULTIHANCE GADOBENATE DIMEGLUMINE 529 MG/ML IV SOLN COMPARISON:  Head MRI/ MRA 05/18/2016 FINDINGS: MRI HEAD FINDINGS There are multiple clustered subcentimeter foci of restricted diffusion along the lateral margin of the left thalamus predominantly involving the posterior limb of internal capsule. No residual diffusion abnormality is identified at the site of the right anterior thalamus/ genu of internal capsule infarct on the prior MRI. There may be minimal chronic blood products associated with this now old infarct. Chronic microhemorrhage in the right putamen and chronic microhemorrhage versus vascular susceptibility artifact in the left putamen are unchanged. Chronic infarct and associated chronic blood products in the right cerebral peduncle are unchanged. Ventricles and sulci are normal. Multiple small chronic left ACA territory infarcts are again seen involving the anterior corpus callosum, cingulate gyrus, and posterior frontal lobe. There is no mass, midline shift, or extra-axial fluid collection. Orbits are unremarkable. No significant inflammatory changes are seen in the paranasal sinuses are mastoid air cells. Major intracranial vascular flow voids are preserved, with  the left vertebral artery being dominant. MRA HEAD FINDINGS The visualized distal vertebral arteries are patent to the basilar with the left being strongly dominant. There is irregularity and mild narrowing of the right V4 segment. Left PICA, bilateral AICA, and bilateral SCA origins are patent. Right AICA appears dominant. Basilar artery is patent without stenosis. PCAs are patent with mild right P1 stenosis and similar appearance of moderate to severe proximal P2 and more distal stenoses bilaterally. Stump of a vessel extending anteriorly from the left PCA near the P1 - P2 junction is unchanged and may reflect a proximally occluded posterior communicating artery. The internal carotid arteries are patent from skullbase to carotid termini. Mild narrowing of the left ICA at the level of the anterior genu is unchanged. MCAs are patent without evidence of major branch occlusion. No right M1 mass or right MCA bifurcation stenosis. Mild-to-moderate stenoses involving the left M1 segment are unchanged. ACAs are patent with an unchanged severe proximal left A1 stenosis. Severe bilateral A2 stenoses are also similar to the prior study. No intracranial aneurysm is identified. MRA NECK FINDINGS Three vessel aortic arch. Brachiocephalic and subclavian arteries appear widely patent. Cervical carotid arteries are patent bilaterally with mild irregularity in the proximal right ICA but no stenosis. The vertebral arteries are patent with antegrade flow bilaterally. The left vertebral artery is strongly dominant and without significant stenosis. There is moderate stenosis of the proximal right vertebral artery. Evaluation of the right V2 segment on the postcontrast MRA is limited due to its small size and adjacent venous enhancement. There is the suggestion of areas of high-grade stenosis or segmental occlusion of the right V2 segment on the contrast-enhanced MRA, however this is felt to be artifactual as the noncontrast  time-of-flight MRA shows the vessel to be patent with mild irregularity and only mild narrowing. IMPRESSION: 1. Small acute infarcts primarily involving the posterior limb of the left internal capsule. 2. Unchanged chronic infarcts as above. 3. Unchanged advanced intracranial atherosclerosis. No major vessel occlusion. 4. No cervical carotid artery stenosis. 5. Widely patent and strongly dominant left vertebral artery. 6. Moderate proximal right vertebral artery stenosis. Electronically Signed   By: ALogan BoresM.D.   On: 06/19/2016 13:16    My personal review of EKG: Rhythm NSR, no Acute ST changes  Assessment & Plan:      1. CVA - Left internal capsule small acute infarct in a patient with recent history of CVA, seen by neurology, currently symptom free with no focal neurological deficits, continue on aspirin-Plavix and statin for secondary prevention, recently had echocardiogram and carotid duplex, A1c and lipid panel reordered, full stroke workup, will hold further diagnostic tests until stroke team reevaluates and finds the need for retesting in terms of echocardiogram and carotid duplex.  2. DM type II. Poor control last A1c was 11, since she has been started on oral hypoglycemics patient apparently has not been doing well, will hold, will place her on Lantus along with sliding scale and monitor.  3. Hypertension. In stable control, for now hold blood pressure medications due to acute stroke, blood pressure as it is borderline.  4. ARF with hypokalemia. Likely due to recently being placed on ARB/HCTZ, hold medication, hydrate, replete potassium and monitor.  5. Dyslipidemia. Continue statin check lipid panel.    DVT Prophylaxis   Lovenox    AM Labs Ordered, also please review Full Orders  Family Communication: Admission, patients condition and plan of care including tests being ordered have been discussed with the patient and husband who indicate understanding and agree with the plan  and Code Status.  Code Status Full  Likely DC to  Home 1-2 days  Condition GUARDED    Consults called: Neuro    Admission status: Inpt    Time spent in minutes : 35   Dahna Hattabaugh K M.D on 06/19/2016 at 5:14 PM  Between 7am to 7pm - Pager - [phone]. After 7pm go to [email or web address] - password [facility]  [facility] - Office  [phone]

## 2016-06-19 NOTE — Progress Notes (Signed)
Pt's family originally stated that Lipitor makes patient cough, vomit and were told by primary care that Lipitor caused her stroke.  Notified Dr. Thedore MinsSingh.  Then, family realized that it was not Lipitor but Lisinopril that caused patient such symptoms. Administered Lipitor and updated MD. Lawson RadarHeather M Jabori Henegar

## 2016-06-19 NOTE — ED Provider Notes (Signed)
[facility] DEPT Provider Note   CSN: [id number] Arrival date & time: 06/19/16  4742     History   Chief Complaint Chief Complaint  Patient presents with  . Weakness  . Fall    HPI Annette Munoz is a 50 y.o. female.  HPI Patient presents to the emergency department withGait disturbance and weakness.  The patient is unable to really much history, although the family does the patient can tell me where she is does not tell me the month or the day of the week.  The patient does not the year.  The husband gives me the history.  The patient was having trouble with ambulation.  Starting on Sunday morning and then noticed some again today that brought them to the emergency department she has had some altered mental status over the last week.  The family was associating with a new medication that they were utilizing for diabetes. The patient denies chest pain, shortness of breath, headache,blurred vision, neck pain, fever, cough, , numbness, dizziness, anorexia, edema, abdominal pain, nausea, vomiting, diarrhea, rash, back pain, dysuria, hematemesis, bloody stool, near syncope, or syncope. Past Medical History:  Diagnosis Date  . Diabetes mellitus without complication ([place])   . Hypertension   . Stroke [facility])     Patient Active Problem List   Diagnosis Date Noted  . Memory loss 05/22/2016  . Confusion   . Acute ischemic stroke ([place]) 05/18/2016  . Morbid obesity ([place]) 09/30/2015  . Uncontrolled type 2 diabetes mellitus with circulatory disorder, without long-term current use of insulin ([place])   . Essential hypertension   . Cerebrovascular accident (CVA) due to thrombosis of cerebral artery ([place])   . CVA (cerebral infarction) 08/07/2015  . Pure hypercholesterolemia 10/04/2009    Past Surgical History:  Procedure Laterality Date  . CESAREAN SECTION    . TEE WITHOUT CARDIOVERSION N/A 08/10/2015   Procedure: TRANSESOPHAGEAL ECHOCARDIOGRAM (TEE);  Surgeon: [name], MD;   Location: [facility] ENDOSCOPY;  Service: Cardiovascular;  Laterality: N/A;    OB History    No data available       Home Medications    Prior to Admission medications   Medication Sig Start Date End Date Taking? Authorizing Provider  amLODipine (NORVASC) 10 MG tablet Take 1 tablet (10 mg total) by mouth daily. 05/23/16  Yes Rosalin Hawking, MD  aspirin 325 MG tablet Take 1 tablet (325 mg total) by mouth daily. 12/20/15  Yes Rosalin Hawking, MD  atorvastatin (LIPITOR) 80 MG tablet TAKE 1 TABLET (80 MG TOTAL) BY MOUTH DAILY AT 6 PM. 12/20/15  Yes Rosalin Hawking, MD  clopidogrel (PLAVIX) 75 MG tablet Take 1 tablet (75 mg total) by mouth daily. 05/21/16  Yes Velvet Bathe, MD  donepezil (ARICEPT) 5 MG tablet Take 1 tablet (5 mg total) by mouth at bedtime. 05/22/16  Yes Rosalin Hawking, MD  glipiZIDE (GLUCOTROL XL) 5 MG 24 hr tablet Take 1 tablet (5 mg total) by mouth daily with breakfast. 05/23/16  Yes Rosalin Hawking, MD  metFORMIN (GLUCOPHAGE XR) 500 MG 24 hr tablet Take 2 tablets (1,000 mg total) by mouth 2 (two) times daily. 08/12/15  Yes Shanker Kristeen Mans, MD  valsartan-hydrochlorothiazide (DIOVAN-HCT) 320-12.5 MG tablet Take 1 tablet by mouth every morning.   Yes Historical Provider, MD  Alcohol Swabs (ALCOHOL PREP) PADS Use to check blood sugar daily. E11.65 09/16/15   Mancel Bale, PA-C  blood glucose meter kit and supplies KIT Use daily to check blood sugar. E11.65 09/16/15  Mancel Bale, PA-C  Blood Pressure Monitoring (PREMIUM AUTOMATIC BP MONITOR) DEVI 1 Device by Does not apply route daily. 08/17/15   Mancel Bale, PA-C  glucose blood test strip Use to check blood sugar daily. E11.65 09/16/15   Mancel Bale, PA-C  Lancet Devices (LANCING DEVICE) MISC Use daily to check blood sugar. E11.65 09/16/15   Mancel Bale, PA-C  Lancets MISC Use to check blood sugar daily. E11.65 09/16/15   Mancel Bale, PA-C    Family History Family History  Problem Relation Age of Onset  . Diabetes Mother   . Heart disease Mother   .  Hypertension Mother   . Hypertension Father   . Stroke Father   . Stroke Maternal Grandmother     Social History Social History  Substance Use Topics  . Smoking status: Never Smoker  . Smokeless tobacco: Never Used  . Alcohol use No     Comment: occasionally     Allergies   Glyxambi [empagliflozin-linagliptin] and Lisinopril   Review of Systems Review of Systems All other systems negative except as documented in the HPI. All pertinent positives and negatives as reviewed in the HPI.  Physical Exam Updated Vital Signs BP 121/58   Pulse 67   Resp 12   Ht 5' 2"  (1.575 m)   Wt 108.9 kg   LMP 06/11/2016   SpO2 100%   BMI 43.90 kg/m   Physical Exam  Constitutional: She is oriented to person, place, and time. She appears well-developed and well-nourished. No distress.  HENT:  Head: Normocephalic and atraumatic.  Mouth/Throat: Oropharynx is clear and moist.  Eyes: Pupils are equal, round, and reactive to light.  Neck: Normal range of motion. Neck supple.  Cardiovascular: Normal rate, regular rhythm and normal heart sounds.  Exam reveals no gallop and no friction rub.   No murmur heard. Pulmonary/Chest: Effort normal and breath sounds normal. No respiratory distress. She has no wheezes.  Abdominal: Soft. Bowel sounds are normal. She exhibits no distension. There is no tenderness.  Neurological: She is alert and oriented to person, place, and time. No sensory deficit. She exhibits normal muscle tone. Coordination normal. GCS eye subscore is 4. GCS verbal subscore is 5. GCS motor subscore is 6.  Patient may have some mild strength discrepancy on the right.  Patient's gait does seem and examination  Skin: Skin is warm and dry. No rash noted. No erythema.  Psychiatric: She has a normal mood and affect. Her behavior is normal.  Nursing note and vitals reviewed.    ED Treatments / Results  Labs (all labs ordered are listed, but only abnormal results are displayed) Labs  Reviewed  COMPREHENSIVE METABOLIC PANEL - Abnormal; Notable for the following:       Result Value   Potassium 3.0 (*)    Glucose, Bld 125 (*)    BUN 35 (*)    Creatinine, Ser 1.48 (*)    Calcium 8.1 (*)    Total Bilirubin 1.3 (*)    GFR calc non Af Amer 40 (*)    GFR calc Af Amer 47 (*)    All other components within normal limits  URINALYSIS, ROUTINE W REFLEX MICROSCOPIC (NOT AT [facility]) - Abnormal; Notable for the following:    Glucose, UA >1000 (*)    Bilirubin Urine SMALL (*)    Protein, ur 30 (*)    All other components within normal limits  CBC WITH DIFFERENTIAL/PLATELET - Abnormal; Notable for the following:  RBC 3.69 (*)    Hemoglobin 11.5 (*)    HCT 34.9 (*)    Platelets 434 (*)    All other components within normal limits  URINE MICROSCOPIC-ADD ON - Abnormal; Notable for the following:    Squamous Epithelial / LPF 6-30 (*)    Bacteria, UA RARE (*)    All other components within normal limits  URINE CULTURE  ETHANOL  URINE RAPID DRUG SCREEN, HOSP PERFORMED    EKG  EKG Interpretation  Date/Time:  Monday June 19 2016 09:44:02 EDT Ventricular Rate:  75 PR Interval:    QRS Duration: 101 QT Interval:  425 QTC Calculation: 475 R Axis:   -27 Text Interpretation:  Sinus rhythm Borderline left axis deviation No significant change since last tracing Confirmed by Zenia Resides  MD, ANTHONY ([id number]) on 06/19/2016 1:10:09 PM       Radiology Mr Jodene Nam Head Wo Contrast  Result Date: 06/19/2016 CLINICAL DATA:  Gait difficulty. Increasing weakness, nausea, and false. Syncopal episode with vomiting. EXAM: MRI HEAD WITHOUT CONTRAST MRA HEAD WITHOUT CONTRAST MRA NECK WITHOUT AND WITH CONTRAST TECHNIQUE: Multiplanar, multiecho pulse sequences of the brain and surrounding structures were obtained without intravenous contrast. Angiographic images of the Circle of Willis were obtained using MRA technique without intravenous contrast. Angiographic images of the neck were obtained using MRA  technique without and with intravenous contrast. Carotid stenosis measurements (when applicable) are obtained utilizing NASCET criteria, using the distal internal carotid diameter as the denominator. CONTRAST:  14m MULTIHANCE GADOBENATE DIMEGLUMINE 529 MG/ML IV SOLN COMPARISON:  Head MRI/ MRA 05/18/2016 FINDINGS: MRI HEAD FINDINGS There are multiple clustered subcentimeter foci of restricted diffusion along the lateral margin of the left thalamus predominantly involving the posterior limb of internal capsule. No residual diffusion abnormality is identified at the site of the right anterior thalamus/ genu of internal capsule infarct on the prior MRI. There may be minimal chronic blood products associated with this now old infarct. Chronic microhemorrhage in the right putamen and chronic microhemorrhage versus vascular susceptibility artifact in the left putamen are unchanged. Chronic infarct and associated chronic blood products in the right cerebral peduncle are unchanged. Ventricles and sulci are normal. Multiple small chronic left ACA territory infarcts are again seen involving the anterior corpus callosum, cingulate gyrus, and posterior frontal lobe. There is no mass, midline shift, or extra-axial fluid collection. Orbits are unremarkable. No significant inflammatory changes are seen in the paranasal sinuses are mastoid air cells. Major intracranial vascular flow voids are preserved, with the left vertebral artery being dominant. MRA HEAD FINDINGS The visualized distal vertebral arteries are patent to the basilar with the left being strongly dominant. There is irregularity and mild narrowing of the right V4 segment. Left PICA, bilateral AICA, and bilateral SCA origins are patent. Right AICA appears dominant. Basilar artery is patent without stenosis. PCAs are patent with mild right P1 stenosis and similar appearance of moderate to severe proximal P2 and more distal stenoses bilaterally. Stump of a vessel  extending anteriorly from the left PCA near the P1 - P2 junction is unchanged and may reflect a proximally occluded posterior communicating artery. The internal carotid arteries are patent from skullbase to carotid termini. Mild narrowing of the left ICA at the level of the anterior genu is unchanged. MCAs are patent without evidence of major branch occlusion. No right M1 mass or right MCA bifurcation stenosis. Mild-to-moderate stenoses involving the left M1 segment are unchanged. ACAs are patent with an unchanged severe proximal left A1 stenosis. Severe  bilateral A2 stenoses are also similar to the prior study. No intracranial aneurysm is identified. MRA NECK FINDINGS Three vessel aortic arch. Brachiocephalic and subclavian arteries appear widely patent. Cervical carotid arteries are patent bilaterally with mild irregularity in the proximal right ICA but no stenosis. The vertebral arteries are patent with antegrade flow bilaterally. The left vertebral artery is strongly dominant and without significant stenosis. There is moderate stenosis of the proximal right vertebral artery. Evaluation of the right V2 segment on the postcontrast MRA is limited due to its small size and adjacent venous enhancement. There is the suggestion of areas of high-grade stenosis or segmental occlusion of the right V2 segment on the contrast-enhanced MRA, however this is felt to be artifactual as the noncontrast time-of-flight MRA shows the vessel to be patent with mild irregularity and only mild narrowing. IMPRESSION: 1. Small acute infarcts primarily involving the posterior limb of the left internal capsule. 2. Unchanged chronic infarcts as above. 3. Unchanged advanced intracranial atherosclerosis. No major vessel occlusion. 4. No cervical carotid artery stenosis. 5. Widely patent and strongly dominant left vertebral artery. 6. Moderate proximal right vertebral artery stenosis. Electronically Signed   By: Logan Bores M.D.   On:  06/19/2016 13:16   Mr Angiogram Neck W Or Wo Contrast  Result Date: 06/19/2016 CLINICAL DATA:  Gait difficulty. Increasing weakness, nausea, and false. Syncopal episode with vomiting. EXAM: MRI HEAD WITHOUT CONTRAST MRA HEAD WITHOUT CONTRAST MRA NECK WITHOUT AND WITH CONTRAST TECHNIQUE: Multiplanar, multiecho pulse sequences of the brain and surrounding structures were obtained without intravenous contrast. Angiographic images of the Circle of Willis were obtained using MRA technique without intravenous contrast. Angiographic images of the neck were obtained using MRA technique without and with intravenous contrast. Carotid stenosis measurements (when applicable) are obtained utilizing NASCET criteria, using the distal internal carotid diameter as the denominator. CONTRAST:  54m MULTIHANCE GADOBENATE DIMEGLUMINE 529 MG/ML IV SOLN COMPARISON:  Head MRI/ MRA 05/18/2016 FINDINGS: MRI HEAD FINDINGS There are multiple clustered subcentimeter foci of restricted diffusion along the lateral margin of the left thalamus predominantly involving the posterior limb of internal capsule. No residual diffusion abnormality is identified at the site of the right anterior thalamus/ genu of internal capsule infarct on the prior MRI. There may be minimal chronic blood products associated with this now old infarct. Chronic microhemorrhage in the right putamen and chronic microhemorrhage versus vascular susceptibility artifact in the left putamen are unchanged. Chronic infarct and associated chronic blood products in the right cerebral peduncle are unchanged. Ventricles and sulci are normal. Multiple small chronic left ACA territory infarcts are again seen involving the anterior corpus callosum, cingulate gyrus, and posterior frontal lobe. There is no mass, midline shift, or extra-axial fluid collection. Orbits are unremarkable. No significant inflammatory changes are seen in the paranasal sinuses are mastoid air cells. Major  intracranial vascular flow voids are preserved, with the left vertebral artery being dominant. MRA HEAD FINDINGS The visualized distal vertebral arteries are patent to the basilar with the left being strongly dominant. There is irregularity and mild narrowing of the right V4 segment. Left PICA, bilateral AICA, and bilateral SCA origins are patent. Right AICA appears dominant. Basilar artery is patent without stenosis. PCAs are patent with mild right P1 stenosis and similar appearance of moderate to severe proximal P2 and more distal stenoses bilaterally. Stump of a vessel extending anteriorly from the left PCA near the P1 - P2 junction is unchanged and may reflect a proximally occluded posterior communicating artery. The internal  carotid arteries are patent from skullbase to carotid termini. Mild narrowing of the left ICA at the level of the anterior genu is unchanged. MCAs are patent without evidence of major branch occlusion. No right M1 mass or right MCA bifurcation stenosis. Mild-to-moderate stenoses involving the left M1 segment are unchanged. ACAs are patent with an unchanged severe proximal left A1 stenosis. Severe bilateral A2 stenoses are also similar to the prior study. No intracranial aneurysm is identified. MRA NECK FINDINGS Three vessel aortic arch. Brachiocephalic and subclavian arteries appear widely patent. Cervical carotid arteries are patent bilaterally with mild irregularity in the proximal right ICA but no stenosis. The vertebral arteries are patent with antegrade flow bilaterally. The left vertebral artery is strongly dominant and without significant stenosis. There is moderate stenosis of the proximal right vertebral artery. Evaluation of the right V2 segment on the postcontrast MRA is limited due to its small size and adjacent venous enhancement. There is the suggestion of areas of high-grade stenosis or segmental occlusion of the right V2 segment on the contrast-enhanced MRA, however this is  felt to be artifactual as the noncontrast time-of-flight MRA shows the vessel to be patent with mild irregularity and only mild narrowing. IMPRESSION: 1. Small acute infarcts primarily involving the posterior limb of the left internal capsule. 2. Unchanged chronic infarcts as above. 3. Unchanged advanced intracranial atherosclerosis. No major vessel occlusion. 4. No cervical carotid artery stenosis. 5. Widely patent and strongly dominant left vertebral artery. 6. Moderate proximal right vertebral artery stenosis. Electronically Signed   By: Logan Bores M.D.   On: 06/19/2016 13:16   Mr Brain Wo Contrast  Result Date: 06/19/2016 CLINICAL DATA:  Gait difficulty. Increasing weakness, nausea, and false. Syncopal episode with vomiting. EXAM: MRI HEAD WITHOUT CONTRAST MRA HEAD WITHOUT CONTRAST MRA NECK WITHOUT AND WITH CONTRAST TECHNIQUE: Multiplanar, multiecho pulse sequences of the brain and surrounding structures were obtained without intravenous contrast. Angiographic images of the Circle of Willis were obtained using MRA technique without intravenous contrast. Angiographic images of the neck were obtained using MRA technique without and with intravenous contrast. Carotid stenosis measurements (when applicable) are obtained utilizing NASCET criteria, using the distal internal carotid diameter as the denominator. CONTRAST:  49m MULTIHANCE GADOBENATE DIMEGLUMINE 529 MG/ML IV SOLN COMPARISON:  Head MRI/ MRA 05/18/2016 FINDINGS: MRI HEAD FINDINGS There are multiple clustered subcentimeter foci of restricted diffusion along the lateral margin of the left thalamus predominantly involving the posterior limb of internal capsule. No residual diffusion abnormality is identified at the site of the right anterior thalamus/ genu of internal capsule infarct on the prior MRI. There may be minimal chronic blood products associated with this now old infarct. Chronic microhemorrhage in the right putamen and chronic microhemorrhage  versus vascular susceptibility artifact in the left putamen are unchanged. Chronic infarct and associated chronic blood products in the right cerebral peduncle are unchanged. Ventricles and sulci are normal. Multiple small chronic left ACA territory infarcts are again seen involving the anterior corpus callosum, cingulate gyrus, and posterior frontal lobe. There is no mass, midline shift, or extra-axial fluid collection. Orbits are unremarkable. No significant inflammatory changes are seen in the paranasal sinuses are mastoid air cells. Major intracranial vascular flow voids are preserved, with the left vertebral artery being dominant. MRA HEAD FINDINGS The visualized distal vertebral arteries are patent to the basilar with the left being strongly dominant. There is irregularity and mild narrowing of the right V4 segment. Left PICA, bilateral AICA, and bilateral SCA origins are patent. Right  AICA appears dominant. Basilar artery is patent without stenosis. PCAs are patent with mild right P1 stenosis and similar appearance of moderate to severe proximal P2 and more distal stenoses bilaterally. Stump of a vessel extending anteriorly from the left PCA near the P1 - P2 junction is unchanged and may reflect a proximally occluded posterior communicating artery. The internal carotid arteries are patent from skullbase to carotid termini. Mild narrowing of the left ICA at the level of the anterior genu is unchanged. MCAs are patent without evidence of major branch occlusion. No right M1 mass or right MCA bifurcation stenosis. Mild-to-moderate stenoses involving the left M1 segment are unchanged. ACAs are patent with an unchanged severe proximal left A1 stenosis. Severe bilateral A2 stenoses are also similar to the prior study. No intracranial aneurysm is identified. MRA NECK FINDINGS Three vessel aortic arch. Brachiocephalic and subclavian arteries appear widely patent. Cervical carotid arteries are patent bilaterally with  mild irregularity in the proximal right ICA but no stenosis. The vertebral arteries are patent with antegrade flow bilaterally. The left vertebral artery is strongly dominant and without significant stenosis. There is moderate stenosis of the proximal right vertebral artery. Evaluation of the right V2 segment on the postcontrast MRA is limited due to its small size and adjacent venous enhancement. There is the suggestion of areas of high-grade stenosis or segmental occlusion of the right V2 segment on the contrast-enhanced MRA, however this is felt to be artifactual as the noncontrast time-of-flight MRA shows the vessel to be patent with mild irregularity and only mild narrowing. IMPRESSION: 1. Small acute infarcts primarily involving the posterior limb of the left internal capsule. 2. Unchanged chronic infarcts as above. 3. Unchanged advanced intracranial atherosclerosis. No major vessel occlusion. 4. No cervical carotid artery stenosis. 5. Widely patent and strongly dominant left vertebral artery. 6. Moderate proximal right vertebral artery stenosis. Electronically Signed   By: Logan Bores M.D.   On: 06/19/2016 13:16    Procedures Procedures (including critical care time)  Medications Ordered in ED Medications  gadobenate dimeglumine (MULTIHANCE) injection 20 mL (18 mLs Intravenous Contrast Given 06/19/16 1241)     Initial Impression / Assessment and Plan / ED Course  I have reviewed the triage vital signs and the nursing notes.  Pertinent labs & imaging results that were available during my care of the patient were reviewed by me and considered in my medical decision making (see chart for details).  Clinical Course    I ordered an MRI because the patient's recent history of stroke and the fact she has had gait disturbance patient is found to have acute infarcts.  I spoke with the neuro hospitalist, along with the [facility] hospitals will admit the patient.  Patient is advised of the  plan and along with the family.  All questions were answered  Final Clinical Impressions(s) / ED Diagnoses   Final diagnoses:  Gait difficulty    New Prescriptions New Prescriptions   No medications on file     Dalia Heading, PA-C 06/19/16 1511    Lacretia Leigh, MD 06/20/16 1714

## 2016-06-19 NOTE — ED Notes (Signed)
Neurologist at bedside. 

## 2016-06-19 NOTE — Consult Note (Signed)
NEURO HOSPITALIST CONSULT NOTE      Reason for Consult: Acute stroke on MRI   History obtained from:  Patient and family at bedside  HPI:                                                                                                                                          Annette Munoz is an 50 y.o. female presents with worsening gait instability since thursday. MRI brain done in the ER shows acute stroke in the L posterior limb of the internal capsule. Recently a HTN and DM added to medication list one week ago for better control.  Past Medical History:  Diagnosis Date  . Diabetes mellitus without complication (HCC)   . Hypertension   . Stroke [facility])     Past Surgical History:  Procedure Laterality Date  . CESAREAN SECTION    . TEE WITHOUT CARDIOVERSION N/A 08/10/2015   Procedure: TRANSESOPHAGEAL ECHOCARDIOGRAM (TEE);  Surgeon: Chrystie Nose, MD;  Location: [facility] ENDOSCOPY;  Service: Cardiovascular;  Laterality: N/A;    Family History  Problem Relation Age of Onset  . Diabetes Mother   . Heart disease Mother   . Hypertension Mother   . Hypertension Father   . Stroke Father   . Stroke Maternal Grandmother      Social History:  reports that she has never smoked. She has never used smokeless tobacco. She reports that she does not drink alcohol or use drugs.  Allergies  Allergen Reactions  . Glyxambi [Empagliflozin-Linagliptin] Nausea And Vomiting    Makes PT VERY SICK  . Lisinopril Cough    MEDICATIONS:                                                                                                                     I have reviewed the patient's current medications.   ROS:  History obtained from chart review  General ROS: negative for - chills, fatigue, fever, night sweats, weight gain or weight  loss Psychological ROS: negative for - behavioral disorder, hallucinations, memory difficulties, mood swings or suicidal ideation Ophthalmic ROS: negative for - blurry vision, double vision, eye pain or loss of vision ENT ROS: negative for - epistaxis, nasal discharge, oral lesions, sore throat, tinnitus or vertigo Allergy and Immunology ROS: negative for - hives or itchy/watery eyes Hematological and Lymphatic ROS: negative for - bleeding problems, bruising or swollen lymph nodes Endocrine ROS: negative for - galactorrhea, hair pattern changes, polydipsia/polyuria or temperature intolerance Respiratory ROS: negative for - cough, hemoptysis, shortness of breath or wheezing Cardiovascular ROS: negative for - chest pain, dyspnea on exertion, edema or irregular heartbeat Gastrointestinal ROS: negative for - abdominal pain, diarrhea, hematemesis, nausea/vomiting or stool incontinence Genito-Urinary ROS: negative for - dysuria, hematuria, incontinence or urinary frequency/urgency Musculoskeletal ROS: negative for - joint swelling or muscular weakness Neurological ROS: as noted in HPI Dermatological ROS: negative for rash and skin lesion changes   Blood pressure 144/75, pulse 67, resp. rate 16, height 5\' 2"  (1.575 m), weight 108.9 kg (240 lb), last menstrual period 06/11/2016, SpO2 100 %.   Neurologic Examination:                                                                                                      HEENT-  Normocephalic, no lesions, without obvious abnormality.  Normal external eye and conjunctiva.  Normal TM's bilaterally.  Normal auditory canals and external ears. Normal external nose, mucus membranes and septum.  Normal pharynx. Cardiovascular- regular rate and rhythm, S1, S2 normal, no murmur, click, rub or gallop, pulses palpable throughout   Lungs- chest clear, no wheezing, rales, normal symmetric air entry, Heart exam - S1, S2 normal, no murmur, no gallop, rate regular Abdomen-  soft, non-tender; bowel sounds normal; no masses,  no organomegaly   Neurological Examination Mental Status: Alert, oriented x 2, short term memory problems, sequale from her old CVA.  Speech fluent without evidence of aphasia.  Able to follow 3 step commands without difficulty. Cranial Nerves: II: Visual fields grossly normal, pupils equal, round,  III,IV, VI: ptosis not present, extra-ocular motions intact bilaterally V,VII: smile symmetric, facial light touch sensation normal bilaterally VIII: hearing normal bilaterally IX,X: uvula rises symmetrically XI: bilateral shoulder shrug XII: midline tongue extension Motor: Right : Upper extremity   5/5 No drift   Left:     Upper extremity   5/5  Lower extremity   4/5     Lower extremity   5/5 Tone and bulk:normal tone throughout; no atrophy noted Sensory: Pinprick and light touch intact throughout, bilaterally  Cerebellar: normal finger-to-nose, normal rapid alternating movements and normal heel-to-shin test       Lab Results: Basic Metabolic Panel:  Recent Labs Lab 06/19/16 1039  NA 135  K 3.0*  CL 102  CO2 22  GLUCOSE 125*  BUN 35*  CREATININE 1.48*  CALCIUM 8.1*    Liver Function Tests:  Recent Labs Lab 06/19/16 1039  AST  16  ALT 17  ALKPHOS 49  BILITOT 1.3*  PROT 6.9  ALBUMIN 3.9   No results for input(s): LIPASE, AMYLASE in the last 168 hours. No results for input(s): AMMONIA in the last 168 hours.  CBC:  Recent Labs Lab 06/19/16 1039  WBC 7.0  NEUTROABS 4.7  HGB 11.5*  HCT 34.9*  MCV 94.6  PLT 434*    Cardiac Enzymes: No results for input(s): CKTOTAL, CKMB, CKMBINDEX, TROPONINI in the last 168 hours.  Lipid Panel: No results for input(s): CHOL, TRIG, HDL, CHOLHDL, VLDL, LDLCALC in the last 168 hours.  CBG: No results for input(s): GLUCAP in the last 168 hours.  Microbiology: Results for orders placed or performed during the hospital encounter of 05/18/16  Urine culture     Status:  Abnormal   Collection Time: 05/18/16  4:49 PM  Result Value Ref Range Status   Specimen Description URINE, RANDOM  Final   Special Requests NONE  Final   Culture MULTIPLE SPECIES PRESENT, SUGGEST RECOLLECTION (A)  Final   Report Status 05/19/2016 FINAL  Final    Coagulation Studies: No results for input(s): LABPROT, INR in the last 72 hours.  Imaging: Mr Shirlee Latch ZO Contrast  Result Date: 06/19/2016 CLINICAL DATA:  Gait difficulty. Increasing weakness, nausea, and false. Syncopal episode with vomiting. EXAM: MRI HEAD WITHOUT CONTRAST MRA HEAD WITHOUT CONTRAST MRA NECK WITHOUT AND WITH CONTRAST TECHNIQUE: Multiplanar, multiecho pulse sequences of the brain and surrounding structures were obtained without intravenous contrast. Angiographic images of the Circle of Willis were obtained using MRA technique without intravenous contrast. Angiographic images of the neck were obtained using MRA technique without and with intravenous contrast. Carotid stenosis measurements (when applicable) are obtained utilizing NASCET criteria, using the distal internal carotid diameter as the denominator. CONTRAST:  18mL MULTIHANCE GADOBENATE DIMEGLUMINE 529 MG/ML IV SOLN COMPARISON:  Head MRI/ MRA 05/18/2016 FINDINGS: MRI HEAD FINDINGS There are multiple clustered subcentimeter foci of restricted diffusion along the lateral margin of the left thalamus predominantly involving the posterior limb of internal capsule. No residual diffusion abnormality is identified at the site of the right anterior thalamus/ genu of internal capsule infarct on the prior MRI. There may be minimal chronic blood products associated with this now old infarct. Chronic microhemorrhage in the right putamen and chronic microhemorrhage versus vascular susceptibility artifact in the left putamen are unchanged. Chronic infarct and associated chronic blood products in the right cerebral peduncle are unchanged. Ventricles and sulci are normal. Multiple small  chronic left ACA territory infarcts are again seen involving the anterior corpus callosum, cingulate gyrus, and posterior frontal lobe. There is no mass, midline shift, or extra-axial fluid collection. Orbits are unremarkable. No significant inflammatory changes are seen in the paranasal sinuses are mastoid air cells. Major intracranial vascular flow voids are preserved, with the left vertebral artery being dominant. MRA HEAD FINDINGS The visualized distal vertebral arteries are patent to the basilar with the left being strongly dominant. There is irregularity and mild narrowing of the right V4 segment. Left PICA, bilateral AICA, and bilateral SCA origins are patent. Right AICA appears dominant. Basilar artery is patent without stenosis. PCAs are patent with mild right P1 stenosis and similar appearance of moderate to severe proximal P2 and more distal stenoses bilaterally. Stump of a vessel extending anteriorly from the left PCA near the P1 - P2 junction is unchanged and may reflect a proximally occluded posterior communicating artery. The internal carotid arteries are patent from skullbase to carotid termini. Mild narrowing  of the left ICA at the level of the anterior genu is unchanged. MCAs are patent without evidence of major branch occlusion. No right M1 mass or right MCA bifurcation stenosis. Mild-to-moderate stenoses involving the left M1 segment are unchanged. ACAs are patent with an unchanged severe proximal left A1 stenosis. Severe bilateral A2 stenoses are also similar to the prior study. No intracranial aneurysm is identified. MRA NECK FINDINGS Three vessel aortic arch. Brachiocephalic and subclavian arteries appear widely patent. Cervical carotid arteries are patent bilaterally with mild irregularity in the proximal right ICA but no stenosis. The vertebral arteries are patent with antegrade flow bilaterally. The left vertebral artery is strongly dominant and without significant stenosis. There is  moderate stenosis of the proximal right vertebral artery. Evaluation of the right V2 segment on the postcontrast MRA is limited due to its small size and adjacent venous enhancement. There is the suggestion of areas of high-grade stenosis or segmental occlusion of the right V2 segment on the contrast-enhanced MRA, however this is felt to be artifactual as the noncontrast time-of-flight MRA shows the vessel to be patent with mild irregularity and only mild narrowing. IMPRESSION: 1. Small acute infarcts primarily involving the posterior limb of the left internal capsule. 2. Unchanged chronic infarcts as above. 3. Unchanged advanced intracranial atherosclerosis. No major vessel occlusion. 4. No cervical carotid artery stenosis. 5. Widely patent and strongly dominant left vertebral artery. 6. Moderate proximal right vertebral artery stenosis. Electronically Signed   By: Sebastian Ache M.D.   On: 06/19/2016 13:16   Mr Angiogram Neck W Or Wo Contrast  Result Date: 06/19/2016 CLINICAL DATA:  Gait difficulty. Increasing weakness, nausea, and false. Syncopal episode with vomiting. EXAM: MRI HEAD WITHOUT CONTRAST MRA HEAD WITHOUT CONTRAST MRA NECK WITHOUT AND WITH CONTRAST TECHNIQUE: Multiplanar, multiecho pulse sequences of the brain and surrounding structures were obtained without intravenous contrast. Angiographic images of the Circle of Willis were obtained using MRA technique without intravenous contrast. Angiographic images of the neck were obtained using MRA technique without and with intravenous contrast. Carotid stenosis measurements (when applicable) are obtained utilizing NASCET criteria, using the distal internal carotid diameter as the denominator. CONTRAST:  18mL MULTIHANCE GADOBENATE DIMEGLUMINE 529 MG/ML IV SOLN COMPARISON:  Head MRI/ MRA 05/18/2016 FINDINGS: MRI HEAD FINDINGS There are multiple clustered subcentimeter foci of restricted diffusion along the lateral margin of the left thalamus predominantly  involving the posterior limb of internal capsule. No residual diffusion abnormality is identified at the site of the right anterior thalamus/ genu of internal capsule infarct on the prior MRI. There may be minimal chronic blood products associated with this now old infarct. Chronic microhemorrhage in the right putamen and chronic microhemorrhage versus vascular susceptibility artifact in the left putamen are unchanged. Chronic infarct and associated chronic blood products in the right cerebral peduncle are unchanged. Ventricles and sulci are normal. Multiple small chronic left ACA territory infarcts are again seen involving the anterior corpus callosum, cingulate gyrus, and posterior frontal lobe. There is no mass, midline shift, or extra-axial fluid collection. Orbits are unremarkable. No significant inflammatory changes are seen in the paranasal sinuses are mastoid air cells. Major intracranial vascular flow voids are preserved, with the left vertebral artery being dominant. MRA HEAD FINDINGS The visualized distal vertebral arteries are patent to the basilar with the left being strongly dominant. There is irregularity and mild narrowing of the right V4 segment. Left PICA, bilateral AICA, and bilateral SCA origins are patent. Right AICA appears dominant. Basilar artery is patent without  stenosis. PCAs are patent with mild right P1 stenosis and similar appearance of moderate to severe proximal P2 and more distal stenoses bilaterally. Stump of a vessel extending anteriorly from the left PCA near the P1 - P2 junction is unchanged and may reflect a proximally occluded posterior communicating artery. The internal carotid arteries are patent from skullbase to carotid termini. Mild narrowing of the left ICA at the level of the anterior genu is unchanged. MCAs are patent without evidence of major branch occlusion. No right M1 mass or right MCA bifurcation stenosis. Mild-to-moderate stenoses involving the left M1 segment  are unchanged. ACAs are patent with an unchanged severe proximal left A1 stenosis. Severe bilateral A2 stenoses are also similar to the prior study. No intracranial aneurysm is identified. MRA NECK FINDINGS Three vessel aortic arch. Brachiocephalic and subclavian arteries appear widely patent. Cervical carotid arteries are patent bilaterally with mild irregularity in the proximal right ICA but no stenosis. The vertebral arteries are patent with antegrade flow bilaterally. The left vertebral artery is strongly dominant and without significant stenosis. There is moderate stenosis of the proximal right vertebral artery. Evaluation of the right V2 segment on the postcontrast MRA is limited due to its small size and adjacent venous enhancement. There is the suggestion of areas of high-grade stenosis or segmental occlusion of the right V2 segment on the contrast-enhanced MRA, however this is felt to be artifactual as the noncontrast time-of-flight MRA shows the vessel to be patent with mild irregularity and only mild narrowing. IMPRESSION: 1. Small acute infarcts primarily involving the posterior limb of the left internal capsule. 2. Unchanged chronic infarcts as above. 3. Unchanged advanced intracranial atherosclerosis. No major vessel occlusion. 4. No cervical carotid artery stenosis. 5. Widely patent and strongly dominant left vertebral artery. 6. Moderate proximal right vertebral artery stenosis. Electronically Signed   By: Sebastian Ache M.D.   On: 06/19/2016 13:16   Mr Brain Wo Contrast  Result Date: 06/19/2016 CLINICAL DATA:  Gait difficulty. Increasing weakness, nausea, and false. Syncopal episode with vomiting. EXAM: MRI HEAD WITHOUT CONTRAST MRA HEAD WITHOUT CONTRAST MRA NECK WITHOUT AND WITH CONTRAST TECHNIQUE: Multiplanar, multiecho pulse sequences of the brain and surrounding structures were obtained without intravenous contrast. Angiographic images of the Circle of Willis were obtained using MRA technique  without intravenous contrast. Angiographic images of the neck were obtained using MRA technique without and with intravenous contrast. Carotid stenosis measurements (when applicable) are obtained utilizing NASCET criteria, using the distal internal carotid diameter as the denominator. CONTRAST:  18mL MULTIHANCE GADOBENATE DIMEGLUMINE 529 MG/ML IV SOLN COMPARISON:  Head MRI/ MRA 05/18/2016 FINDINGS: MRI HEAD FINDINGS There are multiple clustered subcentimeter foci of restricted diffusion along the lateral margin of the left thalamus predominantly involving the posterior limb of internal capsule. No residual diffusion abnormality is identified at the site of the right anterior thalamus/ genu of internal capsule infarct on the prior MRI. There may be minimal chronic blood products associated with this now old infarct. Chronic microhemorrhage in the right putamen and chronic microhemorrhage versus vascular susceptibility artifact in the left putamen are unchanged. Chronic infarct and associated chronic blood products in the right cerebral peduncle are unchanged. Ventricles and sulci are normal. Multiple small chronic left ACA territory infarcts are again seen involving the anterior corpus callosum, cingulate gyrus, and posterior frontal lobe. There is no mass, midline shift, or extra-axial fluid collection. Orbits are unremarkable. No significant inflammatory changes are seen in the paranasal sinuses are mastoid air cells. Major intracranial vascular  flow voids are preserved, with the left vertebral artery being dominant. MRA HEAD FINDINGS The visualized distal vertebral arteries are patent to the basilar with the left being strongly dominant. There is irregularity and mild narrowing of the right V4 segment. Left PICA, bilateral AICA, and bilateral SCA origins are patent. Right AICA appears dominant. Basilar artery is patent without stenosis. PCAs are patent with mild right P1 stenosis and similar appearance of moderate  to severe proximal P2 and more distal stenoses bilaterally. Stump of a vessel extending anteriorly from the left PCA near the P1 - P2 junction is unchanged and may reflect a proximally occluded posterior communicating artery. The internal carotid arteries are patent from skullbase to carotid termini. Mild narrowing of the left ICA at the level of the anterior genu is unchanged. MCAs are patent without evidence of major branch occlusion. No right M1 mass or right MCA bifurcation stenosis. Mild-to-moderate stenoses involving the left M1 segment are unchanged. ACAs are patent with an unchanged severe proximal left A1 stenosis. Severe bilateral A2 stenoses are also similar to the prior study. No intracranial aneurysm is identified. MRA NECK FINDINGS Three vessel aortic arch. Brachiocephalic and subclavian arteries appear widely patent. Cervical carotid arteries are patent bilaterally with mild irregularity in the proximal right ICA but no stenosis. The vertebral arteries are patent with antegrade flow bilaterally. The left vertebral artery is strongly dominant and without significant stenosis. There is moderate stenosis of the proximal right vertebral artery. Evaluation of the right V2 segment on the postcontrast MRA is limited due to its small size and adjacent venous enhancement. There is the suggestion of areas of high-grade stenosis or segmental occlusion of the right V2 segment on the contrast-enhanced MRA, however this is felt to be artifactual as the noncontrast time-of-flight MRA shows the vessel to be patent with mild irregularity and only mild narrowing. IMPRESSION: 1. Small acute infarcts primarily involving the posterior limb of the left internal capsule. 2. Unchanged chronic infarcts as above. 3. Unchanged advanced intracranial atherosclerosis. No major vessel occlusion. 4. No cervical carotid artery stenosis. 5. Widely patent and strongly dominant left vertebral artery. 6. Moderate proximal right vertebral  artery stenosis. Electronically Signed   By: Sebastian Ache M.D.   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 HgbA1c, fasting lipid panel 2. MRI, MRA  of the brain without contrast completed  3. PT consult, OT consult, Speech consult 4. Echocardiogram 5. Carotid dopplers 6. Prophylactic therapy-Antiplatelet med: Aspirin - dose 325mg  and plavix 75mg  daily 7. Risk factor modification 8. Telemetry monitoring 9. Frequent neuro checks 10 NPO until passes stroke swallow screen 11 please page stroke NP  Or  PA  Or MD from 8am -4 pm  as this patient from this time will be  followed by the stroke.   You can look them up on [email or web address]  Password [email or web address]

## 2016-06-20 ENCOUNTER — Inpatient Hospital Stay (HOSPITAL_COMMUNITY): Payer: Commercial Managed Care - HMO

## 2016-06-20 DIAGNOSIS — I6330 Cerebral infarction due to thrombosis of unspecified cerebral artery: Secondary | ICD-10-CM

## 2016-06-20 DIAGNOSIS — I639 Cerebral infarction, unspecified: Principal | ICD-10-CM

## 2016-06-20 LAB — GLUCOSE, CAPILLARY
Glucose-Capillary: 85 mg/dL (ref 65–99)
Glucose-Capillary: 124 mg/dL — ABNORMAL HIGH (ref 65–99)
Glucose-Capillary: 130 mg/dL — ABNORMAL HIGH (ref 65–99)
Glucose-Capillary: 138 mg/dL — ABNORMAL HIGH (ref 65–99)

## 2016-06-20 LAB — CBC
HCT: 31.2 % — ABNORMAL LOW (ref 36.0–46.0)
HEMOGLOBIN: 10 g/dL — AB (ref 12.0–15.0)
MCH: 30.5 pg (ref 26.0–34.0)
MCHC: 32.1 g/dL (ref 30.0–36.0)
MCV: 95.1 fL (ref 78.0–100.0)
PLATELETS: 387 10*3/uL (ref 150–400)
RBC: 3.28 MIL/uL — AB (ref 3.87–5.11)
RDW: 13.5 % (ref 11.5–15.5)
WBC: 7.5 10*3/uL (ref 4.0–10.5)

## 2016-06-20 LAB — BASIC METABOLIC PANEL
Anion gap: 8 (ref 5–15)
BUN: 26 mg/dL — ABNORMAL HIGH (ref 6–20)
CALCIUM: 7.6 mg/dL — AB (ref 8.9–10.3)
CO2: 22 mmol/L (ref 22–32)
CREATININE: 1.13 mg/dL — AB (ref 0.44–1.00)
Chloride: 107 mmol/L (ref 101–111)
GFR calc Af Amer: >60 mL/min (ref >60)
GFR calc non Af Amer: 56 mL/min — ABNORMAL LOW (ref >60)
Glucose, Bld: 92 mg/dL (ref 65–99)
POTASSIUM: 3.1 mmol/L — AB (ref 3.5–5.1)
Sodium: 137 mmol/L (ref 135–145)

## 2016-06-20 LAB — LIPID PANEL
CHOL/HDL RATIO: 4.1 ratio
CHOLESTEROL: 116 mg/dL (ref 0–200)
HDL: 28 mg/dL — AB (ref >40)
LDL Cholesterol: 62 mg/dL (ref 0–99)
Triglycerides: 132 mg/dL (ref <150)
VLDL: 26 mg/dL (ref 0–40)

## 2016-06-20 LAB — URINE CULTURE: Culture: <10000 — AB

## 2016-06-20 LAB — MAGNESIUM: MAGNESIUM: 1.3 mg/dL — AB (ref 1.7–2.4)

## 2016-06-20 MED ORDER — SODIUM CHLORIDE 0.9 % IV SOLN
INTRAVENOUS | Status: AC
  Administered 2016-06-20: 09:00:00 via INTRAVENOUS

## 2016-06-20 MED ORDER — MAGNESIUM SULFATE IN D5W 1-5 GM/100ML-% IV SOLN
1.0000 g | Freq: Once | INTRAVENOUS | Status: AC
  Administered 2016-06-20: 1 g via INTRAVENOUS
  Filled 2016-06-20: qty 100

## 2016-06-20 MED ORDER — POTASSIUM CHLORIDE CRYS ER 20 MEQ PO TBCR
40.0000 meq | EXTENDED_RELEASE_TABLET | Freq: Four times a day (QID) | ORAL | Status: AC
  Administered 2016-06-20 (×2): 40 meq via ORAL
  Filled 2016-06-20 (×2): qty 2

## 2016-06-20 NOTE — Progress Notes (Signed)
STROKE TEAM PROGRESS NOTE   HISTORY OF PRESENT ILLNESS (per record) Annette Munoz is an 50 y.o. female presents with worsening gait instability since thursday. MRI brain done in the ER shows acute stroke in the L posterior limb of the internal capsule. Recently a HTN and DM added to medication list one week ago for better control.   SUBJECTIVE (INTERVAL HISTORY) The patient's daughter is at the bedside and gives most of the history. She reports that her mother has had recent episodes of syncope and she is concerned about recent medication changes.   OBJECTIVE Temp:  [98 F (36.7 C)-98.8 F (37.1 C)] 98.8 F (37.1 C) (09/05 0500) Pulse Rate:  [66-82] 67 (09/05 0500) Cardiac Rhythm: Normal sinus rhythm (09/04 2127) Resp:  [5-18] 18 (09/05 0500) BP: (108-144)/(55-86) 114/56 (09/05 0500) SpO2:  [97 %-100 %] 98 % (09/05 0500) FiO2 (%):  [21 %] 21 % (09/04 1637) Weight:  [102.2 kg (225 lb 4.8 oz)-108.9 kg (240 lb)] 102.2 kg (225 lb 4.8 oz) (09/04 1730)  CBC:  Recent Labs Lab 06/19/16 1039 06/20/16 0317  WBC 7.0 7.5  NEUTROABS 4.7  --   HGB 11.5* 10.0*  HCT 34.9* 31.2*  MCV 94.6 95.1  PLT 434* 387    Basic Metabolic Panel:  Recent Labs Lab 06/19/16 1039 06/20/16 0317  NA 135 137  K 3.0* 3.1*  CL 102 107  CO2 22 22  GLUCOSE 125* 92  BUN 35* 26*  CREATININE 1.48* 1.13*  CALCIUM 8.1* 7.6*  MG  --  1.3*    Lipid Panel:    Component Value Date/Time   CHOL 116 06/20/2016 0317   TRIG 132 06/20/2016 0317   HDL 28 (L) 06/20/2016 0317   CHOLHDL 4.1 06/20/2016 0317   VLDL 26 06/20/2016 0317   LDLCALC 62 06/20/2016 0317   HgbA1c:  Lab Results  Component Value Date   HGBA1C 11.1 (H) 05/19/2016   Urine Drug Screen:    Component Value Date/Time   LABOPIA NONE DETECTED 06/19/2016 1116   COCAINSCRNUR NONE DETECTED 06/19/2016 1116   LABBENZ NONE DETECTED 06/19/2016 1116   AMPHETMU NONE DETECTED 06/19/2016 1116   THCU NONE DETECTED 06/19/2016 1116   LABBARB NONE  DETECTED 06/19/2016 1116      IMAGING  Mr Mra Head and Neck Wo Contrast 06/19/2016 1. Small acute infarcts primarily involving the posterior limb of the left internal capsule.  2. Unchanged chronic infarcts as above.  3. Unchanged advanced intracranial atherosclerosis. No major vessel occlusion.  4. No cervical carotid artery stenosis.  5. Widely patent and strongly dominant left vertebral artery.  6. Moderate proximal right vertebral artery stenosis.    CTA Head 08/10/2015 1. Infarcts involving the right hypothalamus, anterior genu of corpus callosum on the left, left distal ACA distribution. 2. A high-grade stenosis in proximal right A2 segment and more distal left A3 segment, within the left pericallosal artery. 3. Diffuse attenuation of distal ACA branch vessels, worse on the left. 4. Moderate proximal more mild distal left M1 segment stenoses. 5. Moderate proximal left M2 stenoses beyond the bifurcation. 6. Mild attenuation of distal MCA branch vessels bilaterally. 7. Moderate proximal PCA stenoses bilaterally with significant attenuation of distal small vessels.     PHYSICAL EXAM  Obese middle-aged African-American lady sitting comfortably in a bedside chair. . Afebrile. Head is nontraumatic. Neck is supple without bruit.    Cardiac exam no murmur or gallop. Lungs are clear to auscultation. Distal pulses are well felt.  Neurological Exam ;  Awake  Alert oriented x 3. Diminished attention and recall. Follows two-step commands. Normal speech and language.eye movements full without nystagmus.fundi were not visualized. Vision acuity and fields appear normal. Hearing is normal. Palatal movements are normal. Face symmetric. Tongue midline. Normal strength, tone, reflexes and coordination. Normal sensation. Gait deferred.      ASSESSMENT/PLAN [name] is a 50 y.o. female with history of hypertension, a previous stroke August 2017, diabetes mellitus, mild dementia, and  reported syncope  presenting with gait instability. She did not receive IV t-PA due to late presentation.  Stroke:  Dominant - secondary to small vessel disease.  Resultant  mild gait instability  MRI - Small acute infarcts primarily involving the posterior limb of the left internal capsule.   MRA - Moderate proximal right vertebral artery stenosis  Carotid Doppler - 05/19/2016 - no significant stenosis  2D Echo  - 05/19/2016 - EF 60-65%. - No cardiac source of emboli identified.  LDL - 62  HgbA1c pending  VTE prophylaxis - Lovenox  Diet heart healthy/carb modified Room service appropriate? Yes; Fluid consistency: Thin  aspirin 325 mg daily and clopidogrel 75 mg daily prior to admission, now on aspirin 325 mg daily and clopidogrel 75 mg daily  Patient counseled to be compliant with her antithrombotic medications  Ongoing aggressive stroke risk factor management  Therapy recommendations: Outpatient physical therapy recommended.  Disposition: Pending  Hypertension  Blood pressure mildly low at times but otherwise stable  Permissive hypertension (OK if < 220/120) but gradually normalize in 5-7 days  Long-term BP goal normotensive  Hyperlipidemia  Home meds:  Lipitor 80 mg daily resumed in hospital  LDL 62, goal < 70  Continue statin at discharge  Diabetes  HgbA1c pending, goal < 7.0  Controlled  Other Stroke Risk Factors  Obesity, Body mass index is 41.21 kg/m., recommend weight loss, diet and exercise as appropriate   Hx stroke/TIA  Family hx stroke (father and maternal grandmother)   Other Active Problems  Dehydration - BUN 35; creatinine 1.48 on admission ( 06/01/2016 - BUN 14;  Creatinine 0.74)  Hypokalemia - potassium 3.0 at time of admission  Anemia - 10.0 / 31.2  Hypocalcemia - 7.6  Hypomagnesemia - 1.3    PLAN   Avoid diuretics  Avoid hypotension  Dual antiplatelet therapy recommended for 3 months in August - then Plavix  alone.  Hospital day # 1  Delton See PA-C [facility] Pager [phone] 06/20/2016, 2:10 PM  I have personally examined this patient, reviewed notes, independently viewed imaging studies, participated in medical decision making and plan of care.ROS completed by me personally and pertinent positives fully documented  I have made any additions or clarifications directly to the above note. Agree with note above.  Patient presented with a syncopal episode followed by some worsening gait instability from a small posterior limb internal capsule infarct likely caused by hypotension during the syncope. She had a recent stroke a few weeks ago with a complete stroke workup. Patient needs optimization of her blood pressure as well as fluid status. Recommend systolic blood pressure in the 120-150 range exam and avoid dehydration. I had a long discussion with the patient and her daughter at the bedside and answered questions. Greater than 50% time during this 25 minute visit was spent in counseling and coordination of care about stroke and depression management. Follow-up as an outpatient with Dr. Roda Shutters in stroke clinic. Stroke team will sign off. Kindly call for questions. Delia Heady,  MD Medical Director [facility] Pager: [phone] 06/20/2016 3:28 PM    To contact Stroke Continuity provider, please refer to [email or web address]. After hours, contact General Neurology

## 2016-06-20 NOTE — Progress Notes (Addendum)
PROGRESS NOTE                                                                                                                                                                                                             Patient Demographics:    Annette Munoz, is a 50 y.o. female, DOB - 09/18/1966, ZOX:[id number]  Admit date - 06/19/2016   Admitting Physician Leroy Sea, MD  Outpatient Primary MD for the patient is Geraldo Pitter, MD  LOS - 1  Chief Complaint  Patient presents with  . Weakness  . Fall       Brief Narrative   Annette Munoz  is a 50 y.o. female, with history of DM type II, hypertension, morbid obesity, essential hypertension who waswas admitted 5-6 weeks ago for stroke,comes in with weight complaints ongoing for 4-5 days of generalized weakness, poor balance falls at home,apparently she was recently started on Glucophage, ARB/HCTZ, Glucotrol by PCP about one week ago, according to the family members they went to a restaurant last Thursday, where while sitting and having dinner patient, threw up and then fell, she passed out for a few seconds and also urinated on herself, she was cleared by EMS sent home, she went home and a day later fell again while walking to the bathroom, this morning she was acting confused and fell again while sitting on the toilet seat.  She was then brought to the ER where her workup showed a new small acute infarct on the MRI scan, she was seen by neurology and we were requested to admit the patient for stroke workup. Patient currently is symptom free.   Subjective:    [name] today has, No headache, No chest pain, No abdominal pain - No Nausea, No new weakness tingling or numbness, No Cough - SOB. Mild right knee pain after her fall at home.   Assessment  & Plan :    1. CVA - Left internal capsule small acute infarct in a patient with recent history of CVA, seen by neurology,  currently symptom free with no focal neurological deficits, continue on aspirin-Plavix and statin for secondary prevention, recently had echocardiogram and carotid duplex, A1c and lipid panel reordered, full stroke workup, will hold further diagnostic tests until stroke team reevaluates and finds the need for retesting in terms of echocardiogram and carotid  duplex. MRA brain shows intracranial atherosclerosis, defer any changes in management to neurology.   2. DM type II. Poor control last A1c was 11 few weeks ago, since she has been started on oral hypoglycemics patient apparently has not been doing well, will hold, will place her on Lantus along with sliding scale and monitor. Diabetic and insulin education today.  CBG (last 3)   Recent Labs  06/19/16 2305 06/20/16 0550 06/20/16 1117  GLUCAP 107* 85 124*    3. Hypertension. In stable control, for now hold blood pressure medications due to acute stroke, blood pressure as it is borderline.  4. ARF with hypokalemia. Likely due to recently being placed on ARB/HCTZ, hold medication, hydrate, replete potassium and monitor.  5. Dyslipidemia. Continue statin LDL is under 70.       Family Communication  :  Daughter and husband on the day of admission, husband accusing outpt MDs of poor care, daughter updated again today, called again to update husband later in the evening he refused to talk, also Neuro talked to the patient and daughter x 2. Of note patient herself is Awake and alert.  Code Status : Full  Diet : Heart Healthy Low Carb  Disposition Plan  :  Home in am  Consults  :  Neuro  Procedures  :  MRI/MRA head. CVA, intracranial atherosclerosis  DVT Prophylaxis  :  Lovenox   Lab Results  Component Value Date   PLT 387 06/20/2016    Inpatient Medications  Scheduled Meds: . aspirin  325 mg Oral Daily  . atorvastatin  80 mg Oral q1800  . clopidogrel  75 mg Oral Daily  . donepezil  5 mg Oral QHS  . enoxaparin  (LOVENOX) injection  40 mg Subcutaneous Q24H  . insulin aspart  0-9 Units Subcutaneous TID WC  . insulin glargine  12 Units Subcutaneous Daily  . potassium chloride  40 mEq Oral Q6H  . sodium chloride flush  3 mL Intravenous Q12H   Continuous Infusions: . sodium chloride 75 mL/hr at 06/20/16 0847   PRN Meds:.acetaminophen, albuterol, ondansetron **OR** ondansetron (ZOFRAN) IV, polyethylene glycol, senna-docusate  Antibiotics  :    Anti-infectives    None         Objective:   Vitals:   06/19/16 2131 06/20/16 0100 06/20/16 0500 06/20/16 0935  BP: 115/67 136/67 (!) 114/56 128/60  Pulse: 82 68 67 76  Resp: 16 16 18 20   Temp: 98.8 F (37.1 C) 98.7 F (37.1 C) 98.8 F (37.1 C) 98.4 F (36.9 C)  TempSrc: Oral Oral Oral Oral  SpO2: 97% 97% 98% 99%  Weight:      Height:        Wt Readings from Last 3 Encounters:  06/19/16 102.2 kg (225 lb 4.8 oz)  06/01/16 102.5 kg (226 lb)  05/22/16 101.6 kg (224 lb)     Intake/Output Summary (Last 24 hours) at 06/20/16 1204 Last data filed at 06/19/16 1800  Gross per 24 hour  Intake              590 ml  Output                0 ml  Net              590 ml     Physical Exam  Awake Alert, Oriented X 3, No new F.N deficits, Normal affect Jay.AT,PERRAL Supple Neck,No JVD, No cervical lymphadenopathy appriciated.  Symmetrical Chest wall movement, Good air movement bilaterally,  CTAB RRR,No Gallops,Rubs or new Murmurs, No Parasternal Heave +ve B.Sounds, Abd Soft, No tenderness, No organomegaly appriciated, No rebound - guarding or rigidity. No Cyanosis, Clubbing or edema, No new Rash or bruise      Data Review:    CBC  Recent Labs Lab 06/19/16 1039 06/20/16 0317  WBC 7.0 7.5  HGB 11.5* 10.0*  HCT 34.9* 31.2*  PLT 434* 387  MCV 94.6 95.1  MCH 31.2 30.5  MCHC 33.0 32.1  RDW 13.4 13.5  LYMPHSABS 1.6  --   MONOABS 0.5  --   EOSABS 0.1  --   BASOSABS 0.1  --     Chemistries   Recent Labs Lab 06/19/16 1039  06/20/16 0317  NA 135 137  K 3.0* 3.1*  CL 102 107  CO2 22 22  GLUCOSE 125* 92  BUN 35* 26*  CREATININE 1.48* 1.13*  CALCIUM 8.1* 7.6*  MG  --  1.3*  AST 16  --   ALT 17  --   ALKPHOS 49  --   BILITOT 1.3*  --    ------------------------------------------------------------------------------------------------------------------  Recent Labs  06/20/16 0317  CHOL 116  HDL 28*  LDLCALC 62  TRIG 540  CHOLHDL 4.1    Lab Results  Component Value Date   HGBA1C 11.1 (H) 05/19/2016   ------------------------------------------------------------------------------------------------------------------  Recent Labs  06/19/16 1658  TSH 1.171   ------------------------------------------------------------------------------------------------------------------ No results for input(s): VITAMINB12, FOLATE, FERRITIN, TIBC, IRON, RETICCTPCT in the last 72 hours.  Coagulation profile  Recent Labs Lab 06/19/16 1658  INR 1.08    No results for input(s): DDIMER in the last 72 hours.  Cardiac Enzymes No results for input(s): CKMB, TROPONINI, MYOGLOBIN in the last 168 hours.  Invalid input(s): CK ------------------------------------------------------------------------------------------------------------------ No results found for: BNP  Micro Results Recent Results (from the past 240 hour(s))  Urine culture     Status: Abnormal   Collection Time: 06/19/16 11:16 AM  Result Value Ref Range Status   Specimen Description URINE, CLEAN CATCH  Final   Special Requests NONE  Final   Culture <10,000 COLONIES/mL INSIGNIFICANT GROWTH (A)  Final   Report Status 06/20/2016 FINAL  Final    Radiology Reports Mr Shirlee Latch JW Contrast  Result Date: 06/19/2016 CLINICAL DATA:  Gait difficulty. Increasing weakness, nausea, and false. Syncopal episode with vomiting. EXAM: MRI HEAD WITHOUT CONTRAST MRA HEAD WITHOUT CONTRAST MRA NECK WITHOUT AND WITH CONTRAST TECHNIQUE: Multiplanar, multiecho pulse  sequences of the brain and surrounding structures were obtained without intravenous contrast. Angiographic images of the Circle of Willis were obtained using MRA technique without intravenous contrast. Angiographic images of the neck were obtained using MRA technique without and with intravenous contrast. Carotid stenosis measurements (when applicable) are obtained utilizing NASCET criteria, using the distal internal carotid diameter as the denominator. CONTRAST:  18mL MULTIHANCE GADOBENATE DIMEGLUMINE 529 MG/ML IV SOLN COMPARISON:  Head MRI/ MRA 05/18/2016 FINDINGS: MRI HEAD FINDINGS There are multiple clustered subcentimeter foci of restricted diffusion along the lateral margin of the left thalamus predominantly involving the posterior limb of internal capsule. No residual diffusion abnormality is identified at the site of the right anterior thalamus/ genu of internal capsule infarct on the prior MRI. There may be minimal chronic blood products associated with this now old infarct. Chronic microhemorrhage in the right putamen and chronic microhemorrhage versus vascular susceptibility artifact in the left putamen are unchanged. Chronic infarct and associated chronic blood products in the right cerebral peduncle are unchanged. Ventricles and sulci are normal. Multiple small  chronic left ACA territory infarcts are again seen involving the anterior corpus callosum, cingulate gyrus, and posterior frontal lobe. There is no mass, midline shift, or extra-axial fluid collection. Orbits are unremarkable. No significant inflammatory changes are seen in the paranasal sinuses are mastoid air cells. Major intracranial vascular flow voids are preserved, with the left vertebral artery being dominant. MRA HEAD FINDINGS The visualized distal vertebral arteries are patent to the basilar with the left being strongly dominant. There is irregularity and mild narrowing of the right V4 segment. Left PICA, bilateral AICA, and bilateral  SCA origins are patent. Right AICA appears dominant. Basilar artery is patent without stenosis. PCAs are patent with mild right P1 stenosis and similar appearance of moderate to severe proximal P2 and more distal stenoses bilaterally. Stump of a vessel extending anteriorly from the left PCA near the P1 - P2 junction is unchanged and may reflect a proximally occluded posterior communicating artery. The internal carotid arteries are patent from skullbase to carotid termini. Mild narrowing of the left ICA at the level of the anterior genu is unchanged. MCAs are patent without evidence of major branch occlusion. No right M1 mass or right MCA bifurcation stenosis. Mild-to-moderate stenoses involving the left M1 segment are unchanged. ACAs are patent with an unchanged severe proximal left A1 stenosis. Severe bilateral A2 stenoses are also similar to the prior study. No intracranial aneurysm is identified. MRA NECK FINDINGS Three vessel aortic arch. Brachiocephalic and subclavian arteries appear widely patent. Cervical carotid arteries are patent bilaterally with mild irregularity in the proximal right ICA but no stenosis. The vertebral arteries are patent with antegrade flow bilaterally. The left vertebral artery is strongly dominant and without significant stenosis. There is moderate stenosis of the proximal right vertebral artery. Evaluation of the right V2 segment on the postcontrast MRA is limited due to its small size and adjacent venous enhancement. There is the suggestion of areas of high-grade stenosis or segmental occlusion of the right V2 segment on the contrast-enhanced MRA, however this is felt to be artifactual as the noncontrast time-of-flight MRA shows the vessel to be patent with mild irregularity and only mild narrowing. IMPRESSION: 1. Small acute infarcts primarily involving the posterior limb of the left internal capsule. 2. Unchanged chronic infarcts as above. 3. Unchanged advanced intracranial  atherosclerosis. No major vessel occlusion. 4. No cervical carotid artery stenosis. 5. Widely patent and strongly dominant left vertebral artery. 6. Moderate proximal right vertebral artery stenosis. Electronically Signed   By: Sebastian AcheAllen  Grady M.D.   On: 06/19/2016 13:16   Mr Angiogram Neck W Or Wo Contrast  Result Date: 06/19/2016 CLINICAL DATA:  Gait difficulty. Increasing weakness, nausea, and false. Syncopal episode with vomiting. EXAM: MRI HEAD WITHOUT CONTRAST MRA HEAD WITHOUT CONTRAST MRA NECK WITHOUT AND WITH CONTRAST TECHNIQUE: Multiplanar, multiecho pulse sequences of the brain and surrounding structures were obtained without intravenous contrast. Angiographic images of the Circle of Willis were obtained using MRA technique without intravenous contrast. Angiographic images of the neck were obtained using MRA technique without and with intravenous contrast. Carotid stenosis measurements (when applicable) are obtained utilizing NASCET criteria, using the distal internal carotid diameter as the denominator. CONTRAST:  18mL MULTIHANCE GADOBENATE DIMEGLUMINE 529 MG/ML IV SOLN COMPARISON:  Head MRI/ MRA 05/18/2016 FINDINGS: MRI HEAD FINDINGS There are multiple clustered subcentimeter foci of restricted diffusion along the lateral margin of the left thalamus predominantly involving the posterior limb of internal capsule. No residual diffusion abnormality is identified at the site of the right anterior  thalamus/ genu of internal capsule infarct on the prior MRI. There may be minimal chronic blood products associated with this now old infarct. Chronic microhemorrhage in the right putamen and chronic microhemorrhage versus vascular susceptibility artifact in the left putamen are unchanged. Chronic infarct and associated chronic blood products in the right cerebral peduncle are unchanged. Ventricles and sulci are normal. Multiple small chronic left ACA territory infarcts are again seen involving the anterior corpus  callosum, cingulate gyrus, and posterior frontal lobe. There is no mass, midline shift, or extra-axial fluid collection. Orbits are unremarkable. No significant inflammatory changes are seen in the paranasal sinuses are mastoid air cells. Major intracranial vascular flow voids are preserved, with the left vertebral artery being dominant. MRA HEAD FINDINGS The visualized distal vertebral arteries are patent to the basilar with the left being strongly dominant. There is irregularity and mild narrowing of the right V4 segment. Left PICA, bilateral AICA, and bilateral SCA origins are patent. Right AICA appears dominant. Basilar artery is patent without stenosis. PCAs are patent with mild right P1 stenosis and similar appearance of moderate to severe proximal P2 and more distal stenoses bilaterally. Stump of a vessel extending anteriorly from the left PCA near the P1 - P2 junction is unchanged and may reflect a proximally occluded posterior communicating artery. The internal carotid arteries are patent from skullbase to carotid termini. Mild narrowing of the left ICA at the level of the anterior genu is unchanged. MCAs are patent without evidence of major branch occlusion. No right M1 mass or right MCA bifurcation stenosis. Mild-to-moderate stenoses involving the left M1 segment are unchanged. ACAs are patent with an unchanged severe proximal left A1 stenosis. Severe bilateral A2 stenoses are also similar to the prior study. No intracranial aneurysm is identified. MRA NECK FINDINGS Three vessel aortic arch. Brachiocephalic and subclavian arteries appear widely patent. Cervical carotid arteries are patent bilaterally with mild irregularity in the proximal right ICA but no stenosis. The vertebral arteries are patent with antegrade flow bilaterally. The left vertebral artery is strongly dominant and without significant stenosis. There is moderate stenosis of the proximal right vertebral artery. Evaluation of the right V2  segment on the postcontrast MRA is limited due to its small size and adjacent venous enhancement. There is the suggestion of areas of high-grade stenosis or segmental occlusion of the right V2 segment on the contrast-enhanced MRA, however this is felt to be artifactual as the noncontrast time-of-flight MRA shows the vessel to be patent with mild irregularity and only mild narrowing. IMPRESSION: 1. Small acute infarcts primarily involving the posterior limb of the left internal capsule. 2. Unchanged chronic infarcts as above. 3. Unchanged advanced intracranial atherosclerosis. No major vessel occlusion. 4. No cervical carotid artery stenosis. 5. Widely patent and strongly dominant left vertebral artery. 6. Moderate proximal right vertebral artery stenosis. Electronically Signed   By: Sebastian Ache M.D.   On: 06/19/2016 13:16   Mr Brain Wo Contrast  Result Date: 06/19/2016 CLINICAL DATA:  Gait difficulty. Increasing weakness, nausea, and false. Syncopal episode with vomiting. EXAM: MRI HEAD WITHOUT CONTRAST MRA HEAD WITHOUT CONTRAST MRA NECK WITHOUT AND WITH CONTRAST TECHNIQUE: Multiplanar, multiecho pulse sequences of the brain and surrounding structures were obtained without intravenous contrast. Angiographic images of the Circle of Willis were obtained using MRA technique without intravenous contrast. Angiographic images of the neck were obtained using MRA technique without and with intravenous contrast. Carotid stenosis measurements (when applicable) are obtained utilizing NASCET criteria, using the distal internal carotid diameter as  the denominator. CONTRAST:  18mL MULTIHANCE GADOBENATE DIMEGLUMINE 529 MG/ML IV SOLN COMPARISON:  Head MRI/ MRA 05/18/2016 FINDINGS: MRI HEAD FINDINGS There are multiple clustered subcentimeter foci of restricted diffusion along the lateral margin of the left thalamus predominantly involving the posterior limb of internal capsule. No residual diffusion abnormality is identified at  the site of the right anterior thalamus/ genu of internal capsule infarct on the prior MRI. There may be minimal chronic blood products associated with this now old infarct. Chronic microhemorrhage in the right putamen and chronic microhemorrhage versus vascular susceptibility artifact in the left putamen are unchanged. Chronic infarct and associated chronic blood products in the right cerebral peduncle are unchanged. Ventricles and sulci are normal. Multiple small chronic left ACA territory infarcts are again seen involving the anterior corpus callosum, cingulate gyrus, and posterior frontal lobe. There is no mass, midline shift, or extra-axial fluid collection. Orbits are unremarkable. No significant inflammatory changes are seen in the paranasal sinuses are mastoid air cells. Major intracranial vascular flow voids are preserved, with the left vertebral artery being dominant. MRA HEAD FINDINGS The visualized distal vertebral arteries are patent to the basilar with the left being strongly dominant. There is irregularity and mild narrowing of the right V4 segment. Left PICA, bilateral AICA, and bilateral SCA origins are patent. Right AICA appears dominant. Basilar artery is patent without stenosis. PCAs are patent with mild right P1 stenosis and similar appearance of moderate to severe proximal P2 and more distal stenoses bilaterally. Stump of a vessel extending anteriorly from the left PCA near the P1 - P2 junction is unchanged and may reflect a proximally occluded posterior communicating artery. The internal carotid arteries are patent from skullbase to carotid termini. Mild narrowing of the left ICA at the level of the anterior genu is unchanged. MCAs are patent without evidence of major branch occlusion. No right M1 mass or right MCA bifurcation stenosis. Mild-to-moderate stenoses involving the left M1 segment are unchanged. ACAs are patent with an unchanged severe proximal left A1 stenosis. Severe bilateral A2  stenoses are also similar to the prior study. No intracranial aneurysm is identified. MRA NECK FINDINGS Three vessel aortic arch. Brachiocephalic and subclavian arteries appear widely patent. Cervical carotid arteries are patent bilaterally with mild irregularity in the proximal right ICA but no stenosis. The vertebral arteries are patent with antegrade flow bilaterally. The left vertebral artery is strongly dominant and without significant stenosis. There is moderate stenosis of the proximal right vertebral artery. Evaluation of the right V2 segment on the postcontrast MRA is limited due to its small size and adjacent venous enhancement. There is the suggestion of areas of high-grade stenosis or segmental occlusion of the right V2 segment on the contrast-enhanced MRA, however this is felt to be artifactual as the noncontrast time-of-flight MRA shows the vessel to be patent with mild irregularity and only mild narrowing. IMPRESSION: 1. Small acute infarcts primarily involving the posterior limb of the left internal capsule. 2. Unchanged chronic infarcts as above. 3. Unchanged advanced intracranial atherosclerosis. No major vessel occlusion. 4. No cervical carotid artery stenosis. 5. Widely patent and strongly dominant left vertebral artery. 6. Moderate proximal right vertebral artery stenosis. Electronically Signed   By: Sebastian Ache M.D.   On: 06/19/2016 13:16   Dg Knee Complete 4 Views Right  Result Date: 06/20/2016 CLINICAL DATA:  Acute right knee pain. EXAM: RIGHT KNEE - COMPLETE 4+ VIEW COMPARISON:  None. FINDINGS: The joint spaces are maintained. No fractures identified. No osteochondral lesion. No  joint effusion. Mild enthesopathic changes involving the patella. IMPRESSION: No acute bony findings. Electronically Signed   By: Rudie Meyer M.D.   On: 06/20/2016 08:37    Time Spent in minutes  30   SINGH,PRASHANT K M.D on 06/20/2016 at 12:04 PM  Between 7am to 7pm - Pager - [phone]  After 7pm go  to [email or web address] - password [facility]  [facility] -  Office  [phone]

## 2016-06-20 NOTE — Evaluation (Signed)
Occupational Therapy Evaluation Patient Details Name: Annette Munoz MRN: [id number] DOB: 20-Jan-1966 Today's Date: 06/20/2016    History of Present Illness Patient is a 50 y/o female with hx of HTN, DM, CVA presents with memory difficulties. MRI- New area of acute infarction, medial RIGHT hemisphere affecting anterior-most thalamus as well as the genu of the internal capsule.    Clinical Impression   PT admitted with acute R CVA infarct. Pt currently with functional limitiations due to the deficits listed below (see OT problem list). PTA patient was mod I with all adls. Pt with fall at [facility] and then again at home in the bathroom since last admission. Daughter reports all the falls and decr arousal happen after medications. Daughter with detailed notebook with blood sugars and blood pressures. Pt with noticeable drops in BP in the book to 106/60s which is greatly lower than patients normal baseline.  Pt will benefit from skilled OT to increase their independence and safety with adls and balance to allow discharge outpatient. Pt with pending evaluation sept 11th per daughter.      Follow Up Recommendations  Outpatient OT    Equipment Recommendations  3 in 1 bedside comode;Tub/shower bench    Recommendations for Other Services       Precautions / Restrictions Precautions Precautions: Fall Restrictions Weight Bearing Restrictions: No      Mobility Bed Mobility Overal bed mobility: Modified Independent                Transfers Overall transfer level: Needs assistance Equipment used: Rolling walker (2 wheeled) Transfers: Sit to/from Stand Sit to Stand: Min guard;Min assist         General transfer comment: increased time, guarding on R LE    Balance     Sitting balance-Leahy Scale: Normal       Standing balance-Leahy Scale: Good                         Timed Up and Go Test Manual TUG (seconds):  (pouring water back and forth between cups, no  spilling) Cognitive TUG (seconds):  (not able to correctly count backwards from 100 by 3s .)    ADL Overall ADL's : Needs assistance/impaired     Grooming: Wash/dry face;Wash/dry hands;Min Freight forwarder: Minimal Chartered loss adjuster Details (indicate cue type and reason): transfer to the L Toileting- Clothing Manipulation and Hygiene: Min Aeronautical engineer Details (indicate cue type and reason): will need education on bench transfer next session Functional mobility during ADLs: Minimal assistance;Rolling walker General ADL Comments: Pt with limb on R LE. pt provided RW and reports no changes in walking but lack awareness that the RW and heavier use of BIL UE was relieving pain on R LE     Vision     Perception     Praxis      Pertinent Vitals/Pain       Hand Dominance Right   Extremity/Trunk Assessment Upper Extremity Assessment Upper Extremity Assessment: Overall WFL for tasks assessed   Lower Extremity Assessment Lower Extremity Assessment: Defer to PT evaluation   Cervical / Trunk Assessment Cervical / Trunk Assessment: Normal   Communication Communication Communication: No difficulties   Cognition Arousal/Alertness: Awake/alert Behavior During Therapy: WFL for tasks assessed/performed Overall Cognitive Status: Impaired/Different from baseline Area of Impairment: Attention;Memory;Following commands;Awareness;Safety/judgement;Orientation Orientation  Level: Disoriented to;Place;Situation Current Attention Level: Sustained Memory: Decreased short-term memory Following Commands: Follows multi-step commands inconsistently Safety/Judgement: Decreased awareness of safety;Decreased awareness of deficits Awareness: Intellectual Problem Solving: Slow processing General Comments: pt unable to recall information after 2 minutes. pt unable to verbalize reason for admission.  Pt with knee pain and unable to recall falling in bathroom   General Comments       Exercises       Shoulder Instructions      Home Living Family/patient expects to be discharged to:: Private residence Living Arrangements: Spouse/significant other;Children Available Help at Discharge: Family;Friend(s);Available PRN/intermittently Type of Home: House Home Access: Stairs to enter Entergy CorporationEntrance Stairs-Number of Steps: 8 Entrance Stairs-Rails: Right;Left Home Layout: One level     Bathroom Shower/Tub: Chief Strategy OfficerTub/shower unit   Bathroom Toilet: Standard     Home Equipment: None   Additional Comments: daughter is Consulting civil [place]student [place], spouse works during the day. 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 ( [address])  Lives With: Spouse;Family    Prior Functioning/Environment Level of Independence: Independent        Comments: drives, hairdresser    OT Diagnosis: Generalized weakness;Cognitive deficits;Acute pain   OT Problem List: Decreased strength;Decreased range of motion;Decreased activity tolerance;Impaired balance (sitting and/or standing);Decreased safety awareness;Decreased knowledge of use of DME or AE;Decreased knowledge of precautions;Pain;Decreased cognition   OT Treatment/Interventions: Self-care/ADL training;Therapeutic exercise;DME and/or AE instruction;Therapeutic activities;Cognitive remediation/compensation;Patient/family education;Balance training    OT Goals(Current goals can be found in the care plan section) Acute Rehab OT Goals Patient Stated Goal: to go home OT Goal Formulation: With patient/family Time For Goal Achievement: 07/04/16 Potential to Achieve Goals: Good ADL Goals Pt Will Perform Tub/Shower Transfer: Tub transfer;with supervision;ambulating;tub bench;rolling walker Additional ADL Goal #1: Pt and family will complete sink level adls supervision level without LOB  OT Frequency: Min 2X/week   Barriers to D/C:  Decreased caregiver support  home alone at times       Co-evaluation              End of Session Equipment Utilized During Treatment: Gait belt;Rolling walker Nurse Communication: Mobility status;Precautions  Activity Tolerance: Patient tolerated treatment well Patient left: Other (comment) (with PT Ashly)   Time: 6433-2951: 0959-1015 OT Time Calculation (min): 16 min Charges:  OT General Charges $OT Visit: 1 Procedure OT Evaluation $OT Eval Moderate Complexity: 1 Procedure G-Codes:    Boone MasterJones, Karmyn Lowman B 06/20/2016, 3:09 PM   Mateo FlowJones, Brynn   OTR/L Pager: [phone] Office: [phone] .

## 2016-06-20 NOTE — Evaluation (Signed)
Speech Language Pathology Evaluation Patient Details Name: Annette Munoz MRN: [id number] DOB: 09/05/1966 Today's Date: 06/20/2016 Time: 4098-11911615-1628 SLP Time Calculation (min) (ACUTE ONLY): 13 min  Problem List:  Patient Active Problem List   Diagnosis Date Noted  . Cerebral infarction due to unspecified mechanism   . Gait difficulty   . Memory loss 05/22/2016  . Confusion   . Acute ischemic stroke (HCC) 05/18/2016  . Morbid obesity (HCC) 09/30/2015  . Uncontrolled type 2 diabetes mellitus with circulatory disorder, without long-term current use of insulin (HCC)   . Essential hypertension   . Cerebrovascular accident (CVA) due to thrombosis of cerebral artery (HCC)   . CVA (cerebral infarction) 08/07/2015  . Pure hypercholesterolemia 10/04/2009   Past Medical History:  Past Medical History:  Diagnosis Date  . Diabetes mellitus without complication (HCC)   . Hypertension   . Stroke [facility](HCC)    Past Surgical History:  Past Surgical History:  Procedure Laterality Date  . CESAREAN SECTION    . TEE WITHOUT CARDIOVERSION N/A 08/10/2015   Procedure: TRANSESOPHAGEAL ECHOCARDIOGRAM (TEE);  Surgeon: Chrystie NoseKenneth C Hilty, MD;  [facility];  Service: Cardiovascular;  Laterality: N/A;   HPI:  Patient is a 50 y/o female with hx of HTN, DM, CVA presents with memory difficulties. MRI- New area of acute infarction, medial RIGHT hemisphere affecting anterior-most thalamus as well as the genu of the internal capsule.    Assessment / Plan / Recommendation Clinical Impression  Patient demonstrates moderate-severe cognitive impairments characterized by poor ability to sustain attention, which has an impact the patient's overall ability to store and retrieve information, be aware of deficits, and safety complete functional self-care tasks. Patient also demonstrates poor initiation. Patient would benefit from skilled SLP intervention in order to maximize her functional independence prior to discharge.  Anticipate patient will require 24 hour supervision at home and follow up SLP services.      SLP Assessment  Patient needs continued Speech Lanaguage Pathology Services    Follow Up Recommendations  Outpatient SLP;24 hour supervision/assistance    Frequency and Duration min 2x/week  1 week      SLP Evaluation Prior Functioning  Cognitive/Linguistic Baseline: Baseline deficits Baseline deficit details: attention, memory, problem solving Type of Home: House  Lives With: Spouse;Family Available Help at Discharge: Family;Friend(s);Available PRN/intermittently Education: high school  Vocation: Part time employment   Cognition  Overall Cognitive Status: Impaired/Different from baseline Arousal/Alertness: Awake/alert Orientation Level: Oriented to person;Disoriented to time;Disoriented to place;Oriented to situation Attention: Sustained Sustained Attention: Impaired Sustained Attention Impairment: Verbal basic;Functional basic Memory: Impaired Memory Impairment: Retrieval deficit;Decreased recall of new information;Storage deficit;Decreased short term memory Awareness Impairment: Intellectual impairment Executive Function:  (all impaired ue to lower level deficits) Safety/Judgment: Impaired    Comprehension  Auditory Comprehension Overall Auditory Comprehension: Appears within functional limits for tasks assessed    Expression Expression Primary Mode of Expression: Verbal Verbal Expression Overall Verbal Expression: Appears within functional limits for tasks assessed Written Expression Dominant Hand: Right   Oral / Motor  Oral Motor/Sensory Function Overall Oral Motor/Sensory Function: Within functional limits Motor Speech Overall Motor Speech: Appears within functional limits for tasks assessed   GO                   Charlane FerrettiMelissa Verneta Hamidi, M.A., CCC-SLP [phone]  Annette Munoz 06/20/2016, 5:09 PM

## 2016-06-20 NOTE — Evaluation (Signed)
Physical Therapy Evaluation Patient Details Name: Annette Munoz MRN: [id number] DOB: Dec 12, 1965 Today's Date: 06/20/2016   History of Present Illness  Patient is a 50 y/o female with hx of HTN, DM, CVA presents with memory difficulties. MRI- New area of acute infarction, medial RIGHT hemisphere affecting anterior-most thalamus as well as the genu of the internal capsule.   Clinical Impression  Pt admitted with above. Pt remains to have flat affect and impaired cognition (at baseline). Pt with mild R LE weakness/soreness from fall. Pt functioning at min guard. Pt safe to d/c home with daughter and spouse once medically stable.    Follow Up Recommendations Outpatient PT;Supervision/Assistance - 24 hour    Equipment Recommendations  Rolling walker with 5" wheels;3in1 (PT) (tub bench)    Recommendations for Other Services  (cognitive eval)     Precautions / Restrictions Precautions Precautions: Fall Restrictions Weight Bearing Restrictions: No      Mobility  Bed Mobility               General bed mobility comments: pt received sitting EOB  Transfers Overall transfer level: Needs assistance Equipment used: Rolling walker (2 wheeled) Transfers: Sit to/from Stand Sit to Stand: Min guard;Min assist         General transfer comment: increased time, guarding on R LE  Ambulation/Gait Ambulation/Gait assistance: Min guard Ambulation Distance (Feet): 150 Feet Assistive device: Rolling walker (2 wheeled) Gait Pattern/deviations: Step-through pattern;Decreased stride length Gait velocity: decreased Gait velocity interpretation: Below normal speed for age/gender General Gait Details: mild antalgia on R LE but no knee buckling  Stairs Stairs: Yes Stairs assistance: Min guard Stair Management: One rail Left Number of Stairs: 4 (limited by IV pole) General stair comments: went over "up witht he good, down with the bad" with patient and daughter, patient with poor  recall  Wheelchair Mobility    Modified Rankin (Stroke Patients Only) Modified Rankin (Stroke Patients Only) Pre-Morbid Rankin Score: No symptoms Modified Rankin: No significant disability     Balance Overall balance assessment: Needs assistance   Sitting balance-Leahy Scale: Normal     Standing balance support: During functional activity Standing balance-Leahy Scale: Good                         Timed Up and Go Test Manual TUG (seconds):  (pouring water back and forth between cups, no spilling) Cognitive TUG (seconds):  (not able to correctly count backwards from 100 by 3s .)     Pertinent Vitals/Pain Pain Assessment: No/denies pain    Home Living Family/patient expects to be discharged to:: Private residence Living Arrangements: Spouse/significant other;Children Available Help at Discharge: Family;Friend(s);Available PRN/intermittently Type of Home: House Home Access: Stairs to enter Entrance Stairs-Rails: Doctor, general practice of Steps: 8 Home Layout: One level Home Equipment: None Additional Comments: daughter is Consulting civil engineer in [place], spouse works during the day. 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 [address])    Prior Function Level of Independence: Independent         Comments: drives, hairdresser     Hand Dominance   Dominant Hand: Right    Extremity/Trunk Assessment   Upper Extremity Assessment: Overall WFL for tasks assessed           Lower Extremity Assessment: Overall WFL for tasks assessed (despite reporting discomfort in R knee)      Cervical / Trunk Assessment: Normal  Communication  Communication: No difficulties  Cognition Arousal/Alertness: Awake/alert Behavior During Therapy: WFL for tasks assessed/performed Overall Cognitive Status: Impaired/Different from baseline Area of Impairment: Attention;Memory;Following  commands;Awareness;Safety/judgement;Orientation Orientation Level: Disoriented to;Place Current Attention Level: Sustained Memory: Decreased short-term memory Following Commands: Follows multi-step commands inconsistently Safety/Judgement: Decreased awareness of safety;Decreased awareness of deficits Awareness: Intellectual Problem Solving: Slow processing General Comments: Pt provided :    General Comments      Exercises        Assessment/Plan    PT Assessment Patient needs continued PT services  PT Diagnosis Difficulty walking   PT Problem List Decreased balance;Decreased strength;Decreased activity tolerance;Decreased mobility;Decreased safety awareness  PT Treatment Interventions DME instruction;Gait training;Stair training;Functional mobility training;Therapeutic activities;Therapeutic exercise;Balance training   PT Goals (Current goals can be found in the Care Plan section) Acute Rehab PT Goals Patient Stated Goal: to go home PT Goal Formulation: With patient Time For Goal Achievement: 06/27/16 Potential to Achieve Goals: Good    Frequency Min 2X/week   Barriers to discharge        Co-evaluation               End of Session Equipment Utilized During Treatment: Gait belt Activity Tolerance: Patient tolerated treatment well Patient left: with call bell/phone within reach;with family/visitor present;in chair;with chair alarm set Nurse Communication: Mobility status         Time: 1610-96041021-1044 PT Time Calculation (min) (ACUTE ONLY): 23 min   Charges:   PT Evaluation $PT Eval Moderate Complexity: 1 Procedure PT Treatments $Gait Training: 8-22 mins   PT G CodesMarcene Brawn:        Meldon Hanzlik Marie 06/20/2016, 1:14 PM   Lewis ShockAshly Nixon Sparr, PT, DPT Pager #: [phone] Office #: [phone]

## 2016-06-20 NOTE — Progress Notes (Addendum)
Inpatient Diabetes Program Recommendations  AACE/ADA: New Consensus Statement on Inpatient Glycemic Control (2015)  Target Ranges:  Prepandial:   less than 140 mg/dL      Peak postprandial:   less than 180 mg/dL (1-2 hours)      Critically ill patients:  140 - 180 mg/dL   Lab Results  Component Value Date   GLUCAP 124 (H) 06/20/2016   HGBA1C 11.1 (H) 05/19/2016    Review of Glycemic Control:  Results for Annette SanerYOURSE, Morayma H (MRN [id number]) as of 06/20/2016 15:51  Ref. Range 06/19/2016 17:31 06/19/2016 23:05 06/20/2016 05:50 06/20/2016 11:17  Glucose-Capillary Latest Ref Range: 65 - 99 mg/dL 80 409107 (H) 85 811124 (H)   Diabetes history: Type 2 diabetes Outpatient Diabetes medications: Glyxambi (empagliflozin/linagliptin) 10 mg/5mg  daily- just started one week ago, Glipizide 5 mg daily, Metformin 2000 mg bid Current orders for Inpatient glycemic control:  Lantus 12 units daily, Novolog sensitive tid with meals  Inpatient Diabetes Program Recommendations:    Spoke with patient and family.  Daughter had bag of medication and showed me that patient had started new medication for diabetes. She states that prior to starting Glyxambi, patient's blood sugars were 200 or greater.  However once new medication started, her blood sugars decreased to 73-120 mg/dL.  She feels that patient has been more sleepy since starting this medication also.  Based on patient's lower blood sugars, may consider adjustment of  d/c of Glipizide and reduction of Metformin to 1000 mg bid.  If Glyxambi restarted at discharge, patient will not need insulin at discharge.  Note that patient does need to stay well hydrated if Glyxambi restarted.   Thanks, Beryl MeagerJenny Shantanu Strauch, RN, BC-ADM Inpatient Diabetes Coordinator Pager [phone] (8a-5p)

## 2016-06-20 NOTE — Progress Notes (Signed)
Pt husband has unreasonable questions and rude to staff. Md notified and requested to speak with husband via phone but pt husband refused to speak with Md. Pt husband stated, " Im not going to speak with him over no phone, tell him to bring his butt over here to speak with me." Md had spoken with pt and her daughter in the room this am. Md to speak with husband tomorrow am.

## 2016-06-20 NOTE — Care Management Note (Signed)
Case Management Note  Patient Details  Name: Annette Munoz MRN: [id number] Date of Birth: 11/04/1965  Subjective/Objective:  Pt admitted with CVA. She is from home with family.                  Action/Plan: PT/OT recommending outpatient therapies. She was set up with outpatient therapy after last visit and has an appointment on the 11th with [facility]. Pt with orders for 3 in 1 and walker. CM spoke to Pts husband and daughter and they do want the walker but he husband is refusing the 3 in 1. They are interested in a tub bench but will acquire it at an outside vendor. CM continuing to follow.  Expected Discharge Date:                  Expected Discharge Plan:  Home/Self Care  In-House Referral:     Discharge planning Services  CM Consult  Post Acute Care Choice:  Durable Medical Equipment Choice offered to:  Spouse, Adult Children  DME Arranged:  3-N-1, Walker rolling (Pts husband refusing 3 in 1) DME Agency:  [facility]  HH Arranged:    HH Agency:     Status of Service:  In process, will continue to follow  If discussed at Long Length of Stay Meetings, dates discussed:    Additional Comments:  Kermit BaloKelli F Alixis Harmon, RN 06/20/2016, 1:40 PM

## 2016-06-21 LAB — BASIC METABOLIC PANEL
ANION GAP: 11 (ref 5–15)
BUN: 12 mg/dL (ref 6–20)
CALCIUM: 7.6 mg/dL — AB (ref 8.9–10.3)
CO2: 20 mmol/L — AB (ref 22–32)
Chloride: 107 mmol/L (ref 101–111)
Creatinine, Ser: 0.83 mg/dL (ref 0.44–1.00)
GFR calc Af Amer: >60 mL/min (ref >60)
GFR calc non Af Amer: >60 mL/min (ref >60)
GLUCOSE: 125 mg/dL — AB (ref 65–99)
POTASSIUM: 3.5 mmol/L (ref 3.5–5.1)
Sodium: 138 mmol/L (ref 135–145)

## 2016-06-21 LAB — MAGNESIUM: Magnesium: 1.6 mg/dL — ABNORMAL LOW (ref 1.7–2.4)

## 2016-06-21 LAB — HEMOGLOBIN A1C
HEMOGLOBIN A1C: 7.2 % — AB (ref 4.8–5.6)
MEAN PLASMA GLUCOSE: 160 mg/dL

## 2016-06-21 LAB — GLUCOSE, CAPILLARY
Glucose-Capillary: 128 mg/dL — ABNORMAL HIGH (ref 65–99)
Glucose-Capillary: 122 mg/dL — ABNORMAL HIGH (ref 65–99)

## 2016-06-21 MED ORDER — INSULIN ASPART 100 UNIT/ML ~~LOC~~ SOLN: SUBCUTANEOUS | 12 refills | Status: DC

## 2016-06-21 MED ORDER — INSULIN GLARGINE 100 UNIT/ML ~~LOC~~ SOLN: 12.0000 [IU] | Freq: Every day | SUBCUTANEOUS | 0 refills | Status: DC

## 2016-06-21 MED ORDER — "INSULIN SYRINGE-NEEDLE U-100 25G X 1"" 1 ML MISC": 0 refills | Status: DC

## 2016-06-21 MED ORDER — INSULIN STARTER KIT- SYRINGES (ENGLISH)
1.0000 | Freq: Once | Status: AC
  Administered 2016-06-21: 1
  Filled 2016-06-21: qty 1

## 2016-06-21 MED ORDER — POTASSIUM CHLORIDE CRYS ER 20 MEQ PO TBCR
40.0000 meq | EXTENDED_RELEASE_TABLET | Freq: Once | ORAL | Status: AC
  Administered 2016-06-21: 40 meq via ORAL
  Filled 2016-06-21: qty 2

## 2016-06-21 MED ORDER — MAGNESIUM SULFATE 2 GM/50ML IV SOLN
2.0000 g | Freq: Once | INTRAVENOUS | Status: AC
  Administered 2016-06-21: 2 g via INTRAVENOUS
  Filled 2016-06-21: qty 50

## 2016-06-21 NOTE — Progress Notes (Signed)
Pt observed to be weak on the right leg due to pain at the knee when ambulating to the bathroom, pt also having urgency to pass urine, pt's daughter voiced concern about her ability to take care of herself at home as she might not have 24 hours care and afraid she might fall again,pt also observed to have delayed response to stimuli which daughter said was new, was however reassured and promised to put a note for OT review, will continue to monitor. Obasogie-Asidi, Negar Sieler Efe

## 2016-06-21 NOTE — Progress Notes (Signed)
Pt discharging at this time with husband and daughter taking all personal belongings including rolling walker. Diabetic education kit provided  to pt and family from pharmacy. Pt refused teach back. IV discontinued, dry dressing applied. Discharge instructions provided with verbal understanding. Pt has a follow up appt with primary MD on Monday. Home health services was refused by husband. No noted distress. Pt denies pain or discomfort. No noted distress.

## 2016-06-21 NOTE — Progress Notes (Signed)
This nurse has educated on insulin mechanics but pt and daughter who cares for her at home has no interest because they do not want her to be on insulin. Md was notified but he still recommends insulin.  Spoke with diabetic coordinator who will come by to educate pt and family when husband arrives at 12pm.

## 2016-06-21 NOTE — Discharge Summary (Signed)
Annette Munoz:[id number] DOB: 01-06-1966 DOA: 06/19/2016  PCP: Elyn Peers, MD  Admit date: 06/19/2016  Discharge date: 06/21/2016  Admitted From: Home   Disposition:  Home   Recommendations for Outpatient Follow-up:   Follow up with PCP in 1-2 weeks  PCP Please obtain BMP, magnesium/CBC,   (see Discharge instructions)   PCP Please follow up on the following pending results: None   Home Health: HHPT-RN   Equipment/Devices: None  Consultations: Neuro, DM educator Discharge Condition: Stable   CODE STATUS: Full   Diet Recommendation: Heart healthy, low carbohydrate   Chief Complaint  Patient presents with  . Weakness  . Fall     Brief history of present illness from the day of admission and additional interim summary    [name] a 50 y.o.female,with history of DM type II, hypertension, morbid obesity, essential hypertension who waswas admitted 5-6 weeks ago for stroke,comes in with weight complaints ongoing for 4-5 days of generalized weakness, poor balance falls at home,apparently she was recently started on Glucophage, ARB/HCTZ, Glucotrol by PCP about one week ago, according to the family members they went to a restaurant last Thursday, where while sitting and having dinner patient, threw up and then fell, she passed out for a few seconds and also urinated on herself, she was cleared by EMS sent home, she went home and a day later fell again while walking to the bathroom, this morning she was acting confused and fell again while sitting on the toilet seat.  She was then brought to the ER where her workup showed a new small acute infarct on the MRI scan, she was seen by neurology and we were requested to admit the patient for stroke workup. Patient currently is symptom free.  Hospital issues  addressed     1. CVA - Left internal capsule small acute infarct in a patient with recent history of CVA, seen by neurology, currently symptom free with no focal neurological deficits, continue on aspirin-Plavix and statin for secondary prevention, recently had echocardiogram and carotid duplex, A1c now 7.2 LDL under 70, seen by neurologist and stroke team, she underwent full stroke workup recently. Per neurology continue aspirin, Plavix and statin for secondary prevention with outpatient neurology follow-up. Her neurology likely she recently had dehydration induced hypotension causing her stroke and syncopal episode.   2.DM type II. Poor control last A1c was 11 few weeks ago, since she has been started on oral hypoglycemics patient apparently has not been doing well, per husband multiple episodes of nausea vomiting, oral hypoglycemics and Glucophage discontinued, placed on Lantus and sliding scale with excellent glycemic control, patient will receive diabetic and insulin education along with her daughter who receive the same prior to discharge.  Lab Results  Component Value Date   HGBA1C 7.2 (H) 06/20/2016    CBG (last 3)   Recent Labs  06/20/16 1625 06/20/16 2133 06/21/16 0631  GLUCAP 130* 138* 128*      3.Hypertension. In stable control, for now hold blood pressure medications due to acute  stroke, blood pressure as it is borderline.  4. ARF with hypokalemia, hypomagnesemia. Likely due to recently being placed on ARB/HCTZ, DC medication, hydrated, repleted potassium , he received 2 g of IV magnesium today, stable renal function and potassium levels. Request PCP to check BMP and magnesium levels next visit.  5. Dyslipidemia. Continue statin LDL is under 70.   Discharge diagnosis     Principal Problem:   CVA (cerebral infarction) Active Problems:   Pure hypercholesterolemia   Essential hypertension   Uncontrolled type 2 diabetes mellitus with circulatory disorder, without  long-term current use of insulin (HCC)   Memory loss   Cerebral infarction due to unspecified mechanism    Discharge instructions    Discharge Instructions    Discharge instructions    Complete by:  As directed   Follow with Primary MD Elyn Peers, MD in 3-4 days   Get CBC, CMP, 2 view Chest X ray checked  by Primary MD or SNF MD in 5-7 days ( we routinely change or add medications that can affect your baseline labs and fluid status, therefore we recommend that you get the mentioned basic workup next visit with your PCP, your PCP may decide not to get them or add new tests based on their clinical decision)   Activity: As tolerated with Full fall precautions use walker/cane & assistance as needed   Disposition Home    Diet:   Heart Healthy Low carb.  Accuchecks 4 times/day, Once in AM empty stomach and then before each meal. Log in all results and show them to your Prim.MD in 3 days. If any glucose reading is under 80 or above 300 call your Prim MD immidiately. Follow Low glucose instructions for glucose under 80 as instructed.   For Heart failure patients - Check your Weight same time everyday, if you gain over 2 pounds, or you develop in leg swelling, experience more shortness of breath or chest pain, call your Primary MD immediately. Follow Cardiac Low Salt Diet and 1.5 lit/day fluid restriction.   On your next visit with your primary care physician please Get Medicines reviewed and adjusted.   Please request your Prim.MD to go over all Hospital Tests and Procedure/Radiological results at the follow up, please get all Hospital records sent to your Prim MD by signing hospital release before you go home.   If you experience worsening of your admission symptoms, develop shortness of breath, life threatening emergency, suicidal or homicidal thoughts you must seek medical attention immediately by calling 911 or calling your MD immediately  if symptoms less severe.  You Must  read complete instructions/literature along with all the possible adverse reactions/side effects for all the Medicines you take and that have been prescribed to you. Take any new Medicines after you have completely understood and accpet all the possible adverse reactions/side effects.   Do not drive, operate heavy machinery, perform activities at heights, swimming or participation in water activities or provide baby sitting services if your were admitted for syncope or siezures until you have seen by Primary MD or a Neurologist and advised to do so again.  Do not drive when taking Pain medications.    Do not take more than prescribed Pain, Sleep and Anxiety Medications  Special Instructions: If you have smoked or chewed Tobacco  in the last 2 yrs please stop smoking, stop any regular Alcohol  and or any Recreational drug use.  Wear Seat belts while driving.   Please note  You were cared  for by a hospitalist during your hospital stay. If you have any questions about your discharge medications or the care you received while you were in the hospital after you are discharged, you can call the unit and asked to speak with the hospitalist on call if the hospitalist that took care of you is not available. Once you are discharged, your primary care physician will handle any further medical issues. Please note that NO REFILLS for any discharge medications will be authorized once you are discharged, as it is imperative that you return to your primary care physician (or establish a relationship with a primary care physician if you do not have one) for your aftercare needs so that they can reassess your need for medications and monitor your lab values.   Increase activity slowly    Complete by:  As directed      Discharge Medications     Medication List    STOP taking these medications   glipiZIDE 5 MG 24 hr tablet Commonly known as:  GLUCOTROL XL   metFORMIN 500 MG 24 hr tablet Commonly known as:   GLUCOPHAGE XR   valsartan-hydrochlorothiazide 320-12.5 MG tablet Commonly known as:  DIOVAN-HCT     TAKE these medications   Alcohol Prep Pads Use to check blood sugar daily. E11.65   amLODipine 10 MG tablet Commonly known as:  NORVASC Take 1 tablet (10 mg total) by mouth daily.   aspirin 325 MG tablet Take 1 tablet (325 mg total) by mouth daily.   atorvastatin 80 MG tablet Commonly known as:  LIPITOR TAKE 1 TABLET (80 MG TOTAL) BY MOUTH DAILY AT 6 PM.   blood glucose meter kit and supplies Kit Use daily to check blood sugar. E11.65   clopidogrel 75 MG tablet Commonly known as:  PLAVIX Take 1 tablet (75 mg total) by mouth daily.   donepezil 5 MG tablet Commonly known as:  ARICEPT TAKE 1 TABLET (5 MG TOTAL) BY MOUTH AT BEDTIME.   glucose blood test strip Use to check blood sugar daily. E11.65   insulin aspart 100 UNIT/ML injection Commonly known as:  NOVOLOG Before each meal 3 times a day, 140-199 - 2 units, 200-250 - 4 units, 251-299 - 6 units,  300-349 - 8 units,  350 or above 10 units. Dispense syringes and needles as needed, Ok to switch to PEN if approved. Substitute to any brand approved. DX DM2, Code E11.65   insulin glargine 100 UNIT/ML injection Commonly known as:  LANTUS Inject 0.12 mLs (12 Units total) into the skin at bedtime. Dispense insulin pen if approved, if not dispense as needed syringes and needles for 1 month supply. Can switch to Levemir. Diagnosis E 11.65.   Insulin Syringe-Needle U-100 25G X 1" 1 ML Misc For 4 times a day insulin SQ, 1 month supply. Diagnosis E11.65   Lancets Misc Use to check blood sugar daily. E11.65   Lancing Device Misc Use daily to check blood sugar. E11.65   PREMIUM AUTOMATIC BP MONITOR Devi 1 Device by Does not apply route daily.       Follow-up Information    Elyn Peers, MD. Schedule an appointment as soon as possible for a visit in 3 day(s).   Specialty:  Family Medicine Contact information: [address] [name] [address] [phone]        Xu,Jindong, MD. Schedule an appointment as soon as possible for a visit in 1 week(s).   Specialty:  Neurology Contact information: [phone] [place] [address] [phone]           Major procedures and Radiology Reports - PLEASE review detailed and final reports thoroughly  -        Mr Jodene Nam Head Wo Contrast  Result Date: 06/19/2016 CLINICAL DATA:  Gait difficulty. Increasing weakness, nausea, and false. Syncopal episode with vomiting. EXAM: MRI HEAD WITHOUT CONTRAST MRA HEAD WITHOUT CONTRAST MRA NECK WITHOUT AND WITH CONTRAST TECHNIQUE: Multiplanar, multiecho pulse sequences of the brain and surrounding structures were obtained without intravenous contrast. Angiographic images of the Circle of Willis were obtained using MRA technique without intravenous contrast. Angiographic images of the neck were obtained using MRA technique without and with intravenous contrast. Carotid stenosis measurements (when applicable) are obtained utilizing NASCET criteria, using the distal internal carotid diameter as the denominator. CONTRAST:  77m MULTIHANCE GADOBENATE DIMEGLUMINE 529 MG/ML IV SOLN COMPARISON:  Head MRI/ MRA 05/18/2016 FINDINGS: MRI HEAD FINDINGS There are multiple clustered subcentimeter foci of restricted diffusion along the lateral margin of the left thalamus predominantly involving the posterior limb of internal capsule. No residual diffusion abnormality is identified at the site of the right anterior thalamus/ genu of internal capsule infarct on the prior MRI. There may be minimal chronic blood products associated with this now old infarct. Chronic microhemorrhage in the right putamen and chronic microhemorrhage versus vascular susceptibility artifact in the left putamen are unchanged. Chronic infarct and associated chronic blood products in the right cerebral peduncle are unchanged. Ventricles and sulci are  normal. Multiple small chronic left ACA territory infarcts are again seen involving the anterior corpus callosum, cingulate gyrus, and posterior frontal lobe. There is no mass, midline shift, or extra-axial fluid collection. Orbits are unremarkable. No significant inflammatory changes are seen in the paranasal sinuses are mastoid air cells. Major intracranial vascular flow voids are preserved, with the left vertebral artery being dominant. MRA HEAD FINDINGS The visualized distal vertebral arteries are patent to the basilar with the left being strongly dominant. There is irregularity and mild narrowing of the right V4 segment. Left PICA, bilateral AICA, and bilateral SCA origins are patent. Right AICA appears dominant. Basilar artery is patent without stenosis. PCAs are patent with mild right P1 stenosis and similar appearance of moderate to severe proximal P2 and more distal stenoses bilaterally. Stump of a vessel extending anteriorly from the left PCA near the P1 - P2 junction is unchanged and may reflect a proximally occluded posterior communicating artery. The internal carotid arteries are patent from skullbase to carotid termini. Mild narrowing of the left ICA at the level of the anterior genu is unchanged. MCAs are patent without evidence of major branch occlusion. No right M1 mass or right MCA bifurcation stenosis. Mild-to-moderate stenoses involving the left M1 segment are unchanged. ACAs are patent with an unchanged severe proximal left A1 stenosis. Severe bilateral A2 stenoses are also similar to the prior study. No intracranial aneurysm is identified. MRA NECK FINDINGS Three vessel aortic arch. Brachiocephalic and subclavian arteries appear widely patent. Cervical carotid arteries are patent bilaterally with mild irregularity in the proximal right ICA but no stenosis. The vertebral arteries are patent with antegrade flow bilaterally. The left vertebral artery is strongly dominant and without significant  stenosis. There is moderate stenosis of the proximal right vertebral artery. Evaluation of the right V2 segment on the postcontrast MRA is limited due to its small size and adjacent venous enhancement. There is the suggestion of areas of high-grade stenosis or segmental occlusion of  the right V2 segment on the contrast-enhanced MRA, however this is felt to be artifactual as the noncontrast time-of-flight MRA shows the vessel to be patent with mild irregularity and only mild narrowing. IMPRESSION: 1. Small acute infarcts primarily involving the posterior limb of the left internal capsule. 2. Unchanged chronic infarcts as above. 3. Unchanged advanced intracranial atherosclerosis. No major vessel occlusion. 4. No cervical carotid artery stenosis. 5. Widely patent and strongly dominant left vertebral artery. 6. Moderate proximal right vertebral artery stenosis. Electronically Signed   By: Logan Bores M.D.   On: 06/19/2016 13:16   Mr Angiogram Neck W Or Wo Contrast  Result Date: 06/19/2016 CLINICAL DATA:  Gait difficulty. Increasing weakness, nausea, and false. Syncopal episode with vomiting. EXAM: MRI HEAD WITHOUT CONTRAST MRA HEAD WITHOUT CONTRAST MRA NECK WITHOUT AND WITH CONTRAST TECHNIQUE: Multiplanar, multiecho pulse sequences of the brain and surrounding structures were obtained without intravenous contrast. Angiographic images of the Circle of Willis were obtained using MRA technique without intravenous contrast. Angiographic images of the neck were obtained using MRA technique without and with intravenous contrast. Carotid stenosis measurements (when applicable) are obtained utilizing NASCET criteria, using the distal internal carotid diameter as the denominator. CONTRAST:  102m MULTIHANCE GADOBENATE DIMEGLUMINE 529 MG/ML IV SOLN COMPARISON:  Head MRI/ MRA 05/18/2016 FINDINGS: MRI HEAD FINDINGS There are multiple clustered subcentimeter foci of restricted diffusion along the lateral margin of the left  thalamus predominantly involving the posterior limb of internal capsule. No residual diffusion abnormality is identified at the site of the right anterior thalamus/ genu of internal capsule infarct on the prior MRI. There may be minimal chronic blood products associated with this now old infarct. Chronic microhemorrhage in the right putamen and chronic microhemorrhage versus vascular susceptibility artifact in the left putamen are unchanged. Chronic infarct and associated chronic blood products in the right cerebral peduncle are unchanged. Ventricles and sulci are normal. Multiple small chronic left ACA territory infarcts are again seen involving the anterior corpus callosum, cingulate gyrus, and posterior frontal lobe. There is no mass, midline shift, or extra-axial fluid collection. Orbits are unremarkable. No significant inflammatory changes are seen in the paranasal sinuses are mastoid air cells. Major intracranial vascular flow voids are preserved, with the left vertebral artery being dominant. MRA HEAD FINDINGS The visualized distal vertebral arteries are patent to the basilar with the left being strongly dominant. There is irregularity and mild narrowing of the right V4 segment. Left PICA, bilateral AICA, and bilateral SCA origins are patent. Right AICA appears dominant. Basilar artery is patent without stenosis. PCAs are patent with mild right P1 stenosis and similar appearance of moderate to severe proximal P2 and more distal stenoses bilaterally. Stump of a vessel extending anteriorly from the left PCA near the P1 - P2 junction is unchanged and may reflect a proximally occluded posterior communicating artery. The internal carotid arteries are patent from skullbase to carotid termini. Mild narrowing of the left ICA at the level of the anterior genu is unchanged. MCAs are patent without evidence of major branch occlusion. No right M1 mass or right MCA bifurcation stenosis. Mild-to-moderate stenoses involving  the left M1 segment are unchanged. ACAs are patent with an unchanged severe proximal left A1 stenosis. Severe bilateral A2 stenoses are also similar to the prior study. No intracranial aneurysm is identified. MRA NECK FINDINGS Three vessel aortic arch. Brachiocephalic and subclavian arteries appear widely patent. Cervical carotid arteries are patent bilaterally with mild irregularity in the proximal right ICA but no stenosis. The  vertebral arteries are patent with antegrade flow bilaterally. The left vertebral artery is strongly dominant and without significant stenosis. There is moderate stenosis of the proximal right vertebral artery. Evaluation of the right V2 segment on the postcontrast MRA is limited due to its small size and adjacent venous enhancement. There is the suggestion of areas of high-grade stenosis or segmental occlusion of the right V2 segment on the contrast-enhanced MRA, however this is felt to be artifactual as the noncontrast time-of-flight MRA shows the vessel to be patent with mild irregularity and only mild narrowing. IMPRESSION: 1. Small acute infarcts primarily involving the posterior limb of the left internal capsule. 2. Unchanged chronic infarcts as above. 3. Unchanged advanced intracranial atherosclerosis. No major vessel occlusion. 4. No cervical carotid artery stenosis. 5. Widely patent and strongly dominant left vertebral artery. 6. Moderate proximal right vertebral artery stenosis. Electronically Signed   By: Logan Bores M.D.   On: 06/19/2016 13:16   Mr Brain Wo Contrast  Result Date: 06/19/2016 CLINICAL DATA:  Gait difficulty. Increasing weakness, nausea, and false. Syncopal episode with vomiting. EXAM: MRI HEAD WITHOUT CONTRAST MRA HEAD WITHOUT CONTRAST MRA NECK WITHOUT AND WITH CONTRAST TECHNIQUE: Multiplanar, multiecho pulse sequences of the brain and surrounding structures were obtained without intravenous contrast. Angiographic images of the Circle of Willis were obtained  using MRA technique without intravenous contrast. Angiographic images of the neck were obtained using MRA technique without and with intravenous contrast. Carotid stenosis measurements (when applicable) are obtained utilizing NASCET criteria, using the distal internal carotid diameter as the denominator. CONTRAST:  62m MULTIHANCE GADOBENATE DIMEGLUMINE 529 MG/ML IV SOLN COMPARISON:  Head MRI/ MRA 05/18/2016 FINDINGS: MRI HEAD FINDINGS There are multiple clustered subcentimeter foci of restricted diffusion along the lateral margin of the left thalamus predominantly involving the posterior limb of internal capsule. No residual diffusion abnormality is identified at the site of the right anterior thalamus/ genu of internal capsule infarct on the prior MRI. There may be minimal chronic blood products associated with this now old infarct. Chronic microhemorrhage in the right putamen and chronic microhemorrhage versus vascular susceptibility artifact in the left putamen are unchanged. Chronic infarct and associated chronic blood products in the right cerebral peduncle are unchanged. Ventricles and sulci are normal. Multiple small chronic left ACA territory infarcts are again seen involving the anterior corpus callosum, cingulate gyrus, and posterior frontal lobe. There is no mass, midline shift, or extra-axial fluid collection. Orbits are unremarkable. No significant inflammatory changes are seen in the paranasal sinuses are mastoid air cells. Major intracranial vascular flow voids are preserved, with the left vertebral artery being dominant. MRA HEAD FINDINGS The visualized distal vertebral arteries are patent to the basilar with the left being strongly dominant. There is irregularity and mild narrowing of the right V4 segment. Left PICA, bilateral AICA, and bilateral SCA origins are patent. Right AICA appears dominant. Basilar artery is patent without stenosis. PCAs are patent with mild right P1 stenosis and similar  appearance of moderate to severe proximal P2 and more distal stenoses bilaterally. Stump of a vessel extending anteriorly from the left PCA near the P1 - P2 junction is unchanged and may reflect a proximally occluded posterior communicating artery. The internal carotid arteries are patent from skullbase to carotid termini. Mild narrowing of the left ICA at the level of the anterior genu is unchanged. MCAs are patent without evidence of major branch occlusion. No right M1 mass or right MCA bifurcation stenosis. Mild-to-moderate stenoses involving the left M1 segment are  unchanged. ACAs are patent with an unchanged severe proximal left A1 stenosis. Severe bilateral A2 stenoses are also similar to the prior study. No intracranial aneurysm is identified. MRA NECK FINDINGS Three vessel aortic arch. Brachiocephalic and subclavian arteries appear widely patent. Cervical carotid arteries are patent bilaterally with mild irregularity in the proximal right ICA but no stenosis. The vertebral arteries are patent with antegrade flow bilaterally. The left vertebral artery is strongly dominant and without significant stenosis. There is moderate stenosis of the proximal right vertebral artery. Evaluation of the right V2 segment on the postcontrast MRA is limited due to its small size and adjacent venous enhancement. There is the suggestion of areas of high-grade stenosis or segmental occlusion of the right V2 segment on the contrast-enhanced MRA, however this is felt to be artifactual as the noncontrast time-of-flight MRA shows the vessel to be patent with mild irregularity and only mild narrowing. IMPRESSION: 1. Small acute infarcts primarily involving the posterior limb of the left internal capsule. 2. Unchanged chronic infarcts as above. 3. Unchanged advanced intracranial atherosclerosis. No major vessel occlusion. 4. No cervical carotid artery stenosis. 5. Widely patent and strongly dominant left vertebral artery. 6. Moderate  proximal right vertebral artery stenosis. Electronically Signed   By: Logan Bores M.D.   On: 06/19/2016 13:16   Dg Knee Complete 4 Views Right  Result Date: 06/20/2016 CLINICAL DATA:  Acute right knee pain. EXAM: RIGHT KNEE - COMPLETE 4+ VIEW COMPARISON:  None. FINDINGS: The joint spaces are maintained. No fractures identified. No osteochondral lesion. No joint effusion. Mild enthesopathic changes involving the patella. IMPRESSION: No acute bony findings. Electronically Signed   By: Marijo Sanes M.D.   On: 06/20/2016 08:37    Micro Results    Recent Results (from the past 240 hour(s))  Urine culture     Status: Abnormal   Collection Time: 06/19/16 11:16 AM  Result Value Ref Range Status   Specimen Description URINE, CLEAN CATCH  Final   Special Requests NONE  Final   Culture <10,000 COLONIES/mL INSIGNIFICANT GROWTH (A)  Final   Report Status 06/20/2016 FINAL  Final    Today   Subjective    Nathalya Wolanski today has no headache,no chest abdominal pain,no new weakness tingling or numbness, feels much better wants to go home today.     Objective   Blood pressure (!) 149/66, pulse (!) 57, temperature 98.4 F (36.9 C), temperature source Oral, resp. rate 18, height 5' 2" (1.575 m), weight 102.2 kg (225 lb 4.8 oz), last menstrual period 06/11/2016, SpO2 96 %.   Intake/Output Summary (Last 24 hours) at 06/21/16 0825 Last data filed at 06/21/16 0316  Gross per 24 hour  Intake           968.75 ml  Output                0 ml  Net           968.75 ml    Exam Awake Alert, Oriented x 3, No new F.N deficits, Normal affect [place].AT,PERRAL Supple Neck,No JVD, No cervical lymphadenopathy appriciated.  Symmetrical Chest wall movement, Good air movement bilaterally, CTAB RRR,No Gallops,Rubs or new Murmurs, No Parasternal Heave +ve B.Sounds, Abd Soft, Non tender, No organomegaly appriciated, No rebound -guarding or rigidity. No Cyanosis, Clubbing or edema, No new Rash or bruise   Data Review    CBC w Diff: Lab Results  Component Value Date   WBC 7.5 06/20/2016   HGB 10.0 (L) 06/20/2016  HCT 31.2 (L) 06/20/2016   PLT 387 06/20/2016   LYMPHOPCT 23 06/19/2016   MONOPCT 8 06/19/2016   EOSPCT 2 06/19/2016   BASOPCT 1 06/19/2016    CMP: Lab Results  Component Value Date   NA 138 06/21/2016   K 3.5 06/21/2016   CL 107 06/21/2016   CO2 20 (L) 06/21/2016   BUN 12 06/21/2016   CREATININE 0.83 06/21/2016   CREATININE 0.67 06/24/2015   PROT 6.9 06/19/2016   ALBUMIN 3.9 06/19/2016   BILITOT 1.3 (H) 06/19/2016   ALKPHOS 49 06/19/2016   AST 16 06/19/2016   ALT 17 06/19/2016  .   Total Time in preparing paper work, data evaluation and todays exam - 35 minutes  Thurnell Lose M.D on 06/21/2016 at 8:25 AM  [facility]   Office  [phone]

## 2016-06-21 NOTE — Progress Notes (Signed)
Paged Diabetic Coordinator to come to educate pt on insulin. Pt and her family does not want pt to be on insulin and expressed concerns.

## 2016-06-21 NOTE — Progress Notes (Signed)
Pt requesting to meet with Md when he arrives at 12pm verbally aggressive to staff via phone. Md notified. Pt had an appt with Md at 8am but did not show up stating, " I have to work, my insurance is paying for this, he needs to be on my time."

## 2016-06-21 NOTE — Discharge Instructions (Signed)
Follow with Primary MD Geraldo PitterBLAND,VEITA J, MD in 3-4 days   Get CBC, CMP, 2 view Chest X ray checked  by Primary MD or SNF MD in 5-7 days ( we routinely change or add medications that can affect your baseline labs and fluid status, therefore we recommend that you get the mentioned basic workup next visit with your PCP, your PCP may decide not to get them or add new tests based on their clinical decision)   Activity: As tolerated with Full fall precautions use walker/cane & assistance as needed   Disposition Home    Diet:   Heart Healthy Low carb.  Accuchecks 4 times/day, Once in AM empty stomach and then before each meal. Log in all results and show them to your Prim.MD in 3 days. If any glucose reading is under 80 or above 300 call your Prim MD immidiately. Follow Low glucose instructions for glucose under 80 as instructed.   For Heart failure patients - Check your Weight same time everyday, if you gain over 2 pounds, or you develop in leg swelling, experience more shortness of breath or chest pain, call your Primary MD immediately. Follow Cardiac Low Salt Diet and 1.5 lit/day fluid restriction.   On your next visit with your primary care physician please Get Medicines reviewed and adjusted.   Please request your Prim.MD to go over all Hospital Tests and Procedure/Radiological results at the follow up, please get all Hospital records sent to your Prim MD by signing hospital release before you go home.   If you experience worsening of your admission symptoms, develop shortness of breath, life threatening emergency, suicidal or homicidal thoughts you must seek medical attention immediately by calling 911 or calling your MD immediately  if symptoms less severe.  You Must read complete instructions/literature along with all the possible adverse reactions/side effects for all the Medicines you take and that have been prescribed to you. Take any new Medicines after you have completely understood  and accpet all the possible adverse reactions/side effects.   Do not drive, operate heavy machinery, perform activities at heights, swimming or participation in water activities or provide baby sitting services if your were admitted for syncope or siezures until you have seen by Primary MD or a Neurologist and advised to do so again.  Do not drive when taking Pain medications.    Do not take more than prescribed Pain, Sleep and Anxiety Medications  Special Instructions: If you have smoked or chewed Tobacco  in the last 2 yrs please stop smoking, stop any regular Alcohol  and or any Recreational drug use.  Wear Seat belts while driving.   Please note  You were cared for by a hospitalist during your hospital stay. If you have any questions about your discharge medications or the care you received while you were in the hospital after you are discharged, you can call the unit and asked to speak with the hospitalist on call if the hospitalist that took care of you is not available. Once you are discharged, your primary care physician will handle any further medical issues. Please note that NO REFILLS for any discharge medications will be authorized once you are discharged, as it is imperative that you return to your primary care physician (or establish a relationship with a primary care physician if you do not have one) for your aftercare needs so that they can reassess your need for medications and monitor your lab values.

## 2016-06-21 NOTE — Care Management Note (Addendum)
Case Management Note  Patient Details  Name: Annette Munoz MRN: [id number] Date of Birth: May 13, 1966  Subjective/Objective:                    Action/Plan: Pt with orders for [facility] services. CM met with the patient and her daughter to provide a list of [place] agencies in the [place] area. The daughter also inquired about aides that could provide care during the day. CM informed her that would be private duty and the cost would be out of pocket. The daughter asked that she call her father to inquire about [facility] services. CM spoke to Mr Cerra on the phone and informed him about he orders for [facility] services. He became very angry and started yelling over the phone about not needing [place] services and that he and his daughters could take care of the patient at home. CM asked about leaving a private duty list and he refused. Mr Ramnauth then started yelling about how he was not taking the patient home on insulin and how her sugars are controlled at home. CM informed him that he would have to speak to the MD about the patients discharge medications. Mr Pasley stated he would be at the hospital at 12:00 and would talk about her medications then. CM sent information to Dr Candiss Norse about the [facility] being refused and that Pts husband would like to speak with him later today.   11:56 am: MD asking that outpatient therapy be reordered. Orders placed in EPIC and will put information on the AVS.  Expected Discharge Date:                  Expected Discharge Plan:  Home/Self Care  In-House Referral:     Discharge planning Services  CM Consult  Post Acute Care Choice:  Durable Medical Equipment Choice offered to:  Spouse, Adult Children  DME Arranged:  3-N-1, Walker rolling (Pts husband refusing 3 in 1) DME Agency:  [place]:   (Pts husband refusing [place] services) [place] Agency:     Status of Service:  Completed, signed off  If discussed at [place] of Stay Meetings, dates discussed:     Additional Comments:  Pollie Friar, RN 06/21/2016, 10:49 AM

## 2016-06-21 NOTE — Progress Notes (Signed)
Inpatient Diabetes Program Recommendations  AACE/ADA: New Consensus Statement on Inpatient Glycemic Control (2015)  Target Ranges:  Prepandial:   less than 140 mg/dL      Peak postprandial:   less than 180 mg/dL (1-2 hours)      Critically ill patients:  140 - 180 mg/dL   Lab Results  Component Value Date   GLUCAP 128 (H) 06/21/2016   HGBA1C 7.2 (H) 06/20/2016    Review of Glycemic ControlResults for Simmon, Ghada H (MRN [id number]) as of 06/21/2016 09:55  Ref. Range 06/20/2016 05:50 06/20/2016 11:17 06/20/2016 16:25 06/20/2016 21:33 06/21/2016 06:31  Glucose-Capillary Latest Ref Range: 65 - 99 mg/dL 85 124 (H) 130 (H) 138 (H) 128 (H)   Inpatient Diabetes Program Recommendations:    Note that blood sugars continue to be controlled.  Spoke with Dr. Singh this morning regarding d/c medications for diabetes. MD states that he will not be restarting oral medications for diabetes (Metformin, Glucotrol, and Glyxambi) due to family's concerns regarding these medications.  He states that he will instead order Lantus and Novolog for home, due to patients blood sugars being well controlled in the hospital.  Per my discussion with daughter and husband yesterday, they do not want patient to be on insulin at home. Dr. Singh states at this time, he will only order insulin and have patient follow-up with PCP.  Went to patient's room to discuss with patient and daughter.  Daughter states that she asked patient and that patient does not want to be on insulin.  Explained that Dr. Singh would be ordering only insulin at discharge and that they would need to follow-up with PCP regarding restart of other diabetes medications.  I did inquire about whether patient was drinking plenty of fluids with new medication "Glyxambi", and daughter states "No".  Explained that this is very important.  Daughter is going to call Dr. Bland's office to get appointment this afternoon or tomorrow.  Told her that insulin starter kit would be ordered  so that insulin teaching can be done.  She verbalized understanding and states that she will discuss with her father.  Teach back done.  Thanks,  Jenny , RN, BC-ADM Inpatient Diabetes Coordinator Pager [phone] (8a-5p)   

## 2016-06-23 ENCOUNTER — Emergency Department (HOSPITAL_COMMUNITY)
Admission: EM | Admit: 2016-06-23 | Discharge: 2016-06-23 | Disposition: A | Discharge status: Home / Self Care | Payer: Commercial Managed Care - HMO | Attending: Emergency Medicine | Admitting: Emergency Medicine

## 2016-06-23 ENCOUNTER — Emergency Department (HOSPITAL_COMMUNITY): Payer: Commercial Managed Care - HMO

## 2016-06-23 ENCOUNTER — Encounter (HOSPITAL_COMMUNITY): Payer: Self-pay | Admitting: Emergency Medicine

## 2016-06-23 DIAGNOSIS — Z794 Long term (current) use of insulin: Secondary | ICD-10-CM | POA: Diagnosis not present

## 2016-06-23 DIAGNOSIS — Z8673 Personal history of transient ischemic attack (TIA), and cerebral infarction without residual deficits: Secondary | ICD-10-CM | POA: Diagnosis not present

## 2016-06-23 DIAGNOSIS — E119 Type 2 diabetes mellitus without complications: Secondary | ICD-10-CM | POA: Insufficient documentation

## 2016-06-23 DIAGNOSIS — M25561 Pain in right knee: Secondary | ICD-10-CM | POA: Diagnosis not present

## 2016-06-23 DIAGNOSIS — I10 Essential (primary) hypertension: Secondary | ICD-10-CM | POA: Insufficient documentation

## 2016-06-23 DIAGNOSIS — Z7982 Long term (current) use of aspirin: Secondary | ICD-10-CM | POA: Insufficient documentation

## 2016-06-23 DIAGNOSIS — R930 Abnormal findings on diagnostic imaging of skull and head, not elsewhere classified: Secondary | ICD-10-CM | POA: Insufficient documentation

## 2016-06-23 DIAGNOSIS — R531 Weakness: Secondary | ICD-10-CM | POA: Diagnosis present

## 2016-06-23 LAB — CBC
HCT: 33.4 % — ABNORMAL LOW (ref 36.0–46.0)
Hemoglobin: 10.7 g/dL — ABNORMAL LOW (ref 12.0–15.0)
MCH: 30.7 pg (ref 26.0–34.0)
MCHC: 32 g/dL (ref 30.0–36.0)
MCV: 95.7 fL (ref 78.0–100.0)
PLATELETS: 418 10*3/uL — AB (ref 150–400)
RBC: 3.49 MIL/uL — ABNORMAL LOW (ref 3.87–5.11)
RDW: 14 % (ref 11.5–15.5)
WBC: 7.7 10*3/uL (ref 4.0–10.5)

## 2016-06-23 LAB — BASIC METABOLIC PANEL
Anion gap: 8 (ref 5–15)
BUN: 11 mg/dL (ref 6–20)
CALCIUM: 8.9 mg/dL (ref 8.9–10.3)
CO2: 22 mmol/L (ref 22–32)
CREATININE: 0.88 mg/dL (ref 0.44–1.00)
Chloride: 108 mmol/L (ref 101–111)
GFR calc Af Amer: >60 mL/min (ref >60)
GLUCOSE: 152 mg/dL — AB (ref 65–99)
Potassium: 3.7 mmol/L (ref 3.5–5.1)
Sodium: 138 mmol/L (ref 135–145)

## 2016-06-23 NOTE — ED Notes (Signed)
Dr. Campos at BS. 

## 2016-06-23 NOTE — ED Notes (Signed)
REturns from xray.

## 2016-06-23 NOTE — ED Provider Notes (Signed)
Patient remains at neurologic baseline. CT without any acute findings. Discussed with neurology and agrees with plans to discharge home to follow up with outpatient neurology. Return precautions given.   Loren Raceravid Lyndzee Kliebert, MD 06/23/16 [phone]

## 2016-06-23 NOTE — ED Notes (Signed)
Pt and family state they undersand instructions. Home stable via w/C.

## 2016-06-23 NOTE — ED Provider Notes (Signed)
[facility] DEPT Provider Note   CSN: [id number] Arrival date & time: 06/23/16  4917     History   Chief Complaint Chief Complaint  Patient presents with  . Weakness    HPI Annette Munoz is a 50 y.o. female.  HPI Patient is brought to the emergency department by family after developing increasing weakness of her right upper right lower extremity today.  Patient was recently diagnosed with an acute stroke and discharged on September 4.  Her stroke at that time was a small left internal capsular stroke with associated right-sided symptoms.  She's been maximized on aspirin and Plavix, hyperlipidemia meds, hypertension meds, oral glycemic meds.  She has not had her outpatient neurology follow-up yet.  Husband reports the right arm and leg were very weak" dragging".  On arrival to emergency department the patient reports she feels much stronger in her right arm or right leg and that they've returned back to baseline at this time.  No headache at this time.  Compliant with her medications.  No injury or trauma.     Past Medical History:  Diagnosis Date  . Diabetes mellitus without complication ([place])   . Hypertension   . Stroke [facility])     Patient Active Problem List   Diagnosis Date Noted  . Cerebral infarction due to unspecified mechanism   . Gait difficulty   . Memory loss 05/22/2016  . Confusion   . Acute ischemic stroke ([place]) 05/18/2016  . Morbid obesity ([place]) 09/30/2015  . Uncontrolled type 2 diabetes mellitus with circulatory disorder, without long-term current use of insulin ([place])   . Essential hypertension   . Cerebrovascular accident (CVA) due to thrombosis of cerebral artery ([place])   . CVA (cerebral infarction) 08/07/2015  . Pure hypercholesterolemia 10/04/2009    Past Surgical History:  Procedure Laterality Date  . CESAREAN SECTION    . TEE WITHOUT CARDIOVERSION N/A 08/10/2015   Procedure: TRANSESOPHAGEAL ECHOCARDIOGRAM (TEE);  Surgeon: [name], MD;   [facility]: [facility] ENDOSCOPY;  Service: Cardiovascular;  Laterality: N/A;    OB History    No data available       Home Medications    Prior to Admission medications   Medication Sig Start Date End Date Taking? Authorizing Provider  amLODipine (NORVASC) 10 MG tablet Take 1 tablet (10 mg total) by mouth daily. 05/23/16  Yes Rosalin Hawking, MD  aspirin 325 MG tablet Take 1 tablet (325 mg total) by mouth daily. 12/20/15  Yes Rosalin Hawking, MD  atorvastatin (LIPITOR) 80 MG tablet TAKE 1 TABLET (80 MG TOTAL) BY MOUTH DAILY AT 6 PM. 12/20/15  Yes Rosalin Hawking, MD  clopidogrel (PLAVIX) 75 MG tablet Take 1 tablet (75 mg total) by mouth daily. 05/21/16  Yes Velvet Bathe, MD  donepezil (ARICEPT) 5 MG tablet TAKE 1 TABLET (5 MG TOTAL) BY MOUTH AT BEDTIME. 06/20/16  Yes Rosalin Hawking, MD  insulin aspart (NOVOLOG) 100 UNIT/ML injection Before each meal 3 times a day, 140-199 - 2 units, 200-250 - 4 units, 251-299 - 6 units,  300-349 - 8 units,  350 or above 10 units. Dispense syringes and needles as needed, Ok to switch to PEN if approved. Substitute to any brand approved. DX DM2, Code E11.65 06/21/16  Yes Thurnell Lose, MD  insulin glargine (LANTUS) 100 UNIT/ML injection Inject 0.12 mLs (12 Units total) into the skin at bedtime. Dispense insulin pen if approved, if not dispense as needed syringes and needles for 1 month supply. Can switch to Levemir.  Diagnosis E 11.65. 06/21/16  Yes Thurnell Lose, MD  Alcohol Swabs (ALCOHOL PREP) PADS Use to check blood sugar daily. E11.65 09/16/15   Mancel Bale, PA-C  blood glucose meter kit and supplies KIT Use daily to check blood sugar. E11.65 09/16/15   Mancel Bale, PA-C  Blood Pressure Monitoring (PREMIUM AUTOMATIC BP MONITOR) DEVI 1 Device by Does not apply route daily. 08/17/15   Mancel Bale, PA-C  glucose blood test strip Use to check blood sugar daily. E11.65 09/16/15   Mancel Bale, PA-C  Insulin Syringe-Needle U-100 25G X 1" 1 ML MISC For 4 times a day insulin SQ, 1 month supply.  Diagnosis E11.65 06/21/16   Thurnell Lose, MD  Lancet Devices (LANCING DEVICE) MISC Use daily to check blood sugar. E11.65 09/16/15   Mancel Bale, PA-C  Lancets MISC Use to check blood sugar daily. E11.65 09/16/15   Mancel Bale, PA-C    Family History Family History  Problem Relation Age of Onset  . Diabetes Mother   . Heart disease Mother   . Hypertension Mother   . Hypertension Father   . Stroke Father   . Stroke Maternal Grandmother     Social History Social History  Substance Use Topics  . Smoking status: Never Smoker  . Smokeless tobacco: Never Used  . Alcohol use No     Comment: occasionally     Allergies   Glyxambi [empagliflozin-linagliptin] and Lisinopril   Review of Systems Review of Systems  All other systems reviewed and are negative.    Physical Exam Updated Vital Signs BP 133/73 (BP Location: Right Arm)   Pulse 64   Temp 97.7 F (36.5 C)   Resp 12   Ht 5' 2"  (1.575 m)   Wt 240 lb (108.9 kg)   LMP 06/11/2016   SpO2 100%   BMI 43.90 kg/m   Physical Exam  Constitutional: She is oriented to person, place, and time. She appears well-developed and well-nourished. No distress.  HENT:  Head: Normocephalic and atraumatic.  Eyes: EOM are normal.  Neck: Normal range of motion.  Cardiovascular: Normal rate and regular rhythm.   Pulmonary/Chest: Effort normal and breath sounds normal.  Abdominal: Soft. She exhibits no distension.  Musculoskeletal: Normal range of motion.  Neurological: She is alert and oriented to person, place, and time.  5 out of 5 strength in bilateral upper and lower extremity major muscle groups in the LEFT upper extremity.5 minus out of 5 strength in bilateral upper and lower extremity major muscle groups in the RIGHT upper extremity.  Skin: Skin is warm and dry.  Psychiatric: She has a normal mood and affect. Judgment normal.  Nursing note and vitals reviewed.    ED Treatments / Results  Labs (all labs ordered are  listed, but only abnormal results are displayed) Labs Reviewed  CBC  BASIC METABOLIC PANEL    EKG  EKG Interpretation None       Radiology No results found.  Procedures Procedures (including critical care time)  Medications Ordered in ED Medications - No data to display   Initial Impression / Assessment and Plan / ED Course  I have reviewed the triage vital signs and the nursing notes.  Pertinent labs & imaging results that were available during my care of the patient were reviewed by me and considered in my medical decision making (see chart for details).  Clinical Course    At this time the patient is on full maximal  preventative meds for her stroke.  She will undergo head CT at this time basic laboratory studies.  She is returned to baseline neurologic status at this time.  She has no new weakness at this time.  The weakness that was present at home is resolved.  Her head CT is normal her labs are normal and she feels back to baseline she can be discharged safely from the emergency department.  She will need ongoing outpatient neurology follow-up.  I had a long discussion with the patient and the patient's family in regards to stroke, stroke prevention, significant possibility of recurrent strokes despite maximal medical therapy.  Care to Dr Lita Mains to follow up on labs and imaging  Final Clinical Impressions(s) / ED Diagnoses   Final diagnoses:  None    New Prescriptions New Prescriptions   No medications on file     Jola Schmidt, MD 06/23/16 [phone]

## 2016-06-23 NOTE — ED Triage Notes (Signed)
Pt arrives to [facility] at this time via [facility].  Pt's daughter reports that pt has increased weakness with ambulation and suffered a fall this morning.   Chief Complaint  Patient presents with  . Weakness   Past Medical History:  Diagnosis Date  . Diabetes mellitus without complication ([facility])   . Hypertension   . Stroke [facility])

## 2016-06-23 NOTE — ED Notes (Signed)
Patient transported to CT at this time via ED stretcher. Pt in no apparent distress at this time.   

## 2016-06-26 ENCOUNTER — Ambulatory Visit: Payer: Commercial Managed Care - HMO | Attending: Family Medicine | Admitting: Physical Therapy

## 2016-06-26 ENCOUNTER — Ambulatory Visit: Payer: Commercial Managed Care - HMO | Admitting: *Deleted

## 2016-06-26 ENCOUNTER — Telehealth: Payer: Self-pay | Admitting: Neurology

## 2016-06-26 DIAGNOSIS — R2689 Other abnormalities of gait and mobility: Secondary | ICD-10-CM | POA: Diagnosis present

## 2016-06-26 DIAGNOSIS — R2681 Unsteadiness on feet: Secondary | ICD-10-CM | POA: Diagnosis present

## 2016-06-26 DIAGNOSIS — R569 Unspecified convulsions: Secondary | ICD-10-CM

## 2016-06-26 DIAGNOSIS — R41841 Cognitive communication deficit: Secondary | ICD-10-CM | POA: Diagnosis present

## 2016-06-26 DIAGNOSIS — I639 Cerebral infarction, unspecified: Secondary | ICD-10-CM | POA: Insufficient documentation

## 2016-06-26 DIAGNOSIS — I69315 Cognitive social or emotional deficit following cerebral infarction: Secondary | ICD-10-CM | POA: Diagnosis present

## 2016-06-26 DIAGNOSIS — R413 Other amnesia: Secondary | ICD-10-CM

## 2016-06-26 DIAGNOSIS — R278 Other lack of coordination: Secondary | ICD-10-CM | POA: Diagnosis present

## 2016-06-26 DIAGNOSIS — M6281 Muscle weakness (generalized): Secondary | ICD-10-CM | POA: Diagnosis present

## 2016-06-26 NOTE — Telephone Encounter (Signed)
Patient is calling to discuss the dosage for medication donepezil (ARICEPT) 5 MG tablet.

## 2016-06-26 NOTE — Therapy (Signed)
[facility] [address] [place], [place], [address] Phone: [phone]   Fax:  [phone]  Physical Therapy Evaluation  Patient Details  Name: Annette Munoz MRN: [id number] Date of Birth: 29-Aug-1966 Referring Provider: Marvel Plan  Encounter Date: 06/26/2016      PT End of Session - 06/26/16 1239    Visit Number 1   Number of Visits 19   Date for PT Re-Evaluation 08/25/16   Authorization Type [employer] 60 visit limit combined   PT Start Time 0910   PT Stop Time 1000   PT Time Calculation (min) 50 min   Equipment Utilized During Treatment Gait belt   Behavior During Therapy Impulsive  R sided neglect      Past Medical History:  Diagnosis Date  . Diabetes mellitus without complication (HCC)   . Hypertension   . Stroke [facility])     Past Surgical History:  Procedure Laterality Date  . CESAREAN SECTION    . TEE WITHOUT CARDIOVERSION N/A 08/10/2015   Procedure: TRANSESOPHAGEAL ECHOCARDIOGRAM (TEE);  Surgeon: Chrystie Nose, MD;  Location: [facility] ENDOSCOPY;  Service: Cardiovascular;  Laterality: N/A;    There were no vitals filed for this visit.       Subjective Assessment - 06/26/16 0913    Subjective Pt is a 50 year old female who presents to OP PT with more weakness on R side, daughter notes since 06/23/16.  She had a fall on Labor Day, with pain in R knee.  Family reports weakness and difficulty walking due to R sided weakness are new since 06/18/16.  Family reports 3 falls in the past 6 months.  2 of those falls were at home in the bathroom.   Patient is accompained by: Family member  husband and daughter   Pertinent History HTN, DM, CVA (x 2), with recent TIA per family report; weakness on R side new since 06/18/16   Patient Stated Goals Pt's goals for therapy are to "do everything I need to do."   Currently in Pain? No/denies  c/o occasional pain when walking in R knee            [facility] PT Assessment - 06/26/16  0918      Assessment   Medical Diagnosis CVA   Referring Provider Marvel Plan   Onset Date/Surgical Date 06/18/16     Precautions   Precautions Fall  Avoid driving until therapy completed     Balance Screen   Has the patient fallen in the past 6 months Yes   How many times? 3   Has the patient had a decrease in activity level because of a fear of falling?  No   Is the patient reluctant to leave their home because of a fear of falling?  No     Home Environment   Living Environment Private residence   Living Arrangements Spouse/significant other;Children   Available Help at Discharge Family  Needs help with shower, walking, safety   Type of Home House   Home Access Stairs to enter   Entrance Stairs-Number of Steps 8   Entrance Stairs-Rails Left  ascending; needs family assistance   Home Layout One level   Home Equipment Walker - 2 wheels     Prior Function   Level of Independence Independent with basic ADLs;Independent with household mobility without device  Now needs assistance of family   Vocation Part time employment  Designer, industrial/product, 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     Cognition  Overall Cognitive Status Impaired/Different from baseline  Per speech assessment     Sensation   Light Touch Appears Intact  to light touch     Posture/Postural Control   Posture/Postural Control Postural limitations   Postural Limitations Rounded Shoulders;Forward head;Posterior pelvic tilt   Posture Comments Holds RLE in external roation and eversion at ankle in sitting     Tone   Assessment Location Right Lower Extremity     ROM / Strength   AROM / PROM / Strength AROM;Strength     AROM   Overall AROM  Deficits   Overall AROM Comments decreased full hip/knee motions-compensates with trunk extension into chair     Strength   Overall Strength Deficits   Strength Assessment Site Hip;Knee;Ankle   Right/Left Hip Right;Left   Right Hip Flexion 3-/5   Left Hip Flexion 3+/5    Right/Left Knee Right;Left   Right Knee Flexion 3-/5   Right Knee Extension 4/5   Left Knee Flexion 3-/5   Left Knee Extension 4/5   Right/Left Ankle Right;Left   Right Ankle Dorsiflexion 1/5   Right Ankle Inversion 0/5   Left Ankle Dorsiflexion 4-/5     Transfers   Transfers Sit to Stand;Stand to Sit   Sit to Stand 4: Min assist;With upper extremity assist;With armrests;From chair/3-in-1;Uncontrolled descent   Sit to Stand Details Tactile cues for sequencing;Tactile cues for placement;Tactile cues for weight beaing;Verbal cues for technique;Verbal cues for sequencing   Stand to Sit 4: Min assist;With upper extremity assist;To chair/3-in-1;Uncontrolled descent   Stand to Sit Details (indicate cue type and reason) Tactile cues for sequencing;Tactile cues for placement;Verbal cues for sequencing;Verbal cues for technique   Comments Pt tends to reach for walker when standing up, and when she prepares to turn to sit, she does not fully turn and has uncontrolled descent.     Ambulation/Gait   Ambulation/Gait Yes   Ambulation/Gait Assistance 4: Min assist;3: Mod assist   Ambulation/Gait Assistance Details Pt has decreased R foot clearance and R knee buckling at times, requiring therapist assist to regain balance.  Pt appears to veer to the R side, R neglect noted.   Ambulation Distance (Feet) 60 Feet   Assistive device Rolling walker   Gait Pattern Step-to pattern;Step-through pattern;Decreased step length - right;Decreased stance time - right;Decreased dorsiflexion - right;Decreased weight shift to right;Right foot flat;Right genu recurvatum;Poor foot clearance - right   Ambulation Surface Level;Indoor   Gait velocity 37.85 sec = 0.87 ft/sec   Stairs Yes   Stairs Assistance 3: Mod assist   Stairs Assistance Details (indicate cue type and reason) verbal and tactile cues given for foot placement and hand placement   Stair Management Technique One rail Left;Step to pattern  Simulating L rail  ascending at home     Standardized Balance Assessment   Standardized Balance Assessment Timed Up and Go Test;Berg Balance Test     Berg Balance Test   Sit to Stand Needs minimal aid to stand or to stabilize   Standing Unsupported Unable to stand 30 seconds unassisted   Sitting with Back Unsupported but Feet Supported on Floor or Stool Able to sit 2 minutes under supervision   Stand to Sit Sits independently, has uncontrolled descent   Transfers Needs one person to assist   Standing Unsupported with Eyes Closed Needs help to keep from falling   Standing Ubsupported with Feet Together Needs help to attain position and unable to hold for 15 seconds   From Standing, Reach Forward  with Outstretched Arm Loses balance while trying/requires external support   From Standing Position, Pick up Object from Floor Unable to try/needs assist to keep balance   From Standing Position, Turn to Look Behind Over each Shoulder Needs assist to keep from losing balance and falling   Turn 360 Degrees Needs assistance while turning   Standing Unsupported, Alternately Place Feet on Step/Stool Needs assistance to keep from falling or unable to try   Standing Unsupported, One Foot in [employer] balance while stepping or standing   Standing on One Leg Unable to try or needs assist to prevent fall   Total Score 6   Berg comment: Scores <45/56 indicate increased fall risk     Timed Up and Go Test   Normal TUG (seconds) 35.85   TUG Comments Scores >13.5 seconds indicate increased fall risk; >30 seconds indicate difficulty with ADLs in the home.     RLE Tone   RLE Tone --  decreased tone noted RLE                     Self Care-See patient instructions for details.      PT Education - 06/26/16 1015    Education provided Yes   Education Details R sided awareness, safety with gait, stairs, safety in the home   Person(s) Educated Patient;Spouse;Child(ren)   Methods  Explanation;Demonstration;Verbal cues   Comprehension Need further instruction          PT Short Term Goals - 06/26/16 1252      PT SHORT TERM GOAL #1   Title Pt/family will verbalize/demo understanding of HEP for strength, balance, transfers, and gait.  TARGET 07/25/16   Time 4   Period Weeks   Status New     PT SHORT TERM GOAL #2   Title Pt will perform sit<>stand transfers with correct sequence, with supervision, 8 of 10 trials, for improved safety and efficiency with transfers.   Time 4   Period Weeks   Status New     PT SHORT TERM GOAL #3   Title Pt will improve TUG score to less than or equal to 28 seconds for decreased fall risk.   Time 4   Period Weeks   Status New     PT SHORT TERM GOAL #4   Title Pt will negotiate at least 4 steps using 1 handrail, with min asistance, for improved safety with home stair negotiation.   Time 4   Period Weeks   Status New     PT SHORT TERM GOAL #5   Title Pt will improve Berg score by at least 5 points (>11/56) for decreased fall risk.   Time 4   Period Weeks   Status New           PT Long Term Goals - 06/26/16 1256      PT LONG TERM GOAL #1   Title Pt/family will verbalize understanding of fall prevention within the home environment.  TARGET 08/25/16   Time 8   Period Weeks   Status New     PT LONG TERM GOAL #2   Title Pt will improve gait velocity to at least 1.3 ft/sec for improved household gait.   Time 8   Period Weeks   Status New     PT LONG TERM GOAL #3   Title Pt will improve TUG score to less than or equal to 20 seconds for decreased fall risk.   Time 8   Period Weeks  PT LONG TERM GOAL #4   Title Pt will improve Berg Balance score to at least 21/56 for decreased fall risk.   Time 8   Period Weeks   Status New     PT LONG TERM GOAL #5   Title Pt will ambulate at least 500 ft, indoor/outdoor surfaces with supervision, for improved household and community ambulation.   Time 8   Period Weeks    Status New     Additional Long Term Goals   Additional Long Term Goals Yes     PT LONG TERM GOAL #6   Title Pt will negotiate at least 4 steps, 1 rail, with supervision of family, for improved safety with stair negotiation into/out of the home.   Time 8   Period Weeks   Status New               Plan - 06/26/16 1241    Clinical Impression Statement Pt is a 50 year old female who presents to OP PT with history of CVA 08/07/15 (family reports memory affected), with recent CVA/TIA early September 2017, resulting in R sided weakness.  Family present to provide history, as pt not accurately answering questions during eval.  Family reports ED visit on 06/23/16 after recent neurologist visit, with increased R sided weakness, and pt is now having to use walker and have family assistance at home.  Pt has had 3 falls in the past 6 months, one fall in early September.  Family reports prior to recent CVA/TIA, pt independent at home.  Pt presents with decreased strength, balance, decreased safety awareness, decreased gait stability and decreased independence with gait.  Pt at significant fall risk per BERG, TUG and gait velocity scores.  Pt would benefit from skilled PT to address the above stated deficits to improve functional mobility and safety in the home.   Rehab Potential Fair   Clinical Impairments Affecting Rehab Potential Cognition (pt does have family support of husband and daughter)   PT Frequency 2x / week   PT Duration Other (comment)  9 weeks; plus eval   PT Treatment/Interventions ADLs/Self Care Home Management;Functional mobility training;Stair training;Gait training;DME Instruction;Therapeutic activities;Therapeutic exercise;Balance training;Neuromuscular re-education;Patient/family education;Orthotic Fit/Training   PT Next Visit Plan Initiate HEP for lower extremity strengthening, transfers, gait; trial AFO   Recommended Other Services Pt may need AFO for RLE   Consulted and Agree  with Plan of Care Patient;Family member/caregiver   Family Member Consulted husband and daughter      Patient will benefit from skilled therapeutic intervention in order to improve the following deficits and impairments:  Abnormal gait, Decreased balance, Decreased cognition, Decreased mobility, Decreased knowledge of use of DME, Decreased safety awareness, Decreased strength, Difficulty walking, Postural dysfunction, Impaired tone  Visit Diagnosis: Other abnormalities of gait and mobility  Muscle weakness (generalized)  Unsteadiness on feet     Problem List Patient Active Problem List   Diagnosis Date Noted  . Cerebral infarction due to unspecified mechanism   . Gait difficulty   . Memory loss 05/22/2016  . Confusion   . Acute ischemic stroke (HCC) 05/18/2016  . Morbid obesity (HCC) 09/30/2015  . Uncontrolled type 2 diabetes mellitus with circulatory disorder, without long-term current use of insulin (HCC)   . Essential hypertension   . Cerebrovascular accident (CVA) due to thrombosis of cerebral artery (HCC)   . CVA (cerebral infarction) 08/07/2015  . Pure hypercholesterolemia 10/04/2009    Tahtiana Rozier W. 06/26/2016, 1:05 PM  Gean Maidens., PT  [facility] [address] [place], [name], [address] Phone: [phone]   Fax:  [phone]  Name: Annette Munoz MRN: [id number] Date of Birth: 08/04/1966

## 2016-06-26 NOTE — Patient Instructions (Signed)
Discussed the following with patient's family:  -Due to R sided neglect, need to stay on patient's R side for safety with gait, but also increase attention to R side by holding hand, rubbing R UE or lower extremity, encouraging proper hand and foot placement with transfers for equal weightbearing through both R and L sides as much as possible.  -Safety with stair negotiation-lead up with the good leg, down with bad leg.  Husband typically in front of patient descending stairs due to no rail on L.  Discussed safety with his foot placement, daughter providing cues.  -Discussed transfer and RW safety in general, use of gait belt, need for family assistance when up. -Gait belt-recommended use of gait belt when patient ambulating-discussed where to obtain/how to use -PT suggested requesting wheelchair order from physician-family declined due to stating no room in home  -PT suggested patient's family follow up with physician (to see Dr. Parke SimmersBland today) to request shower chair for home

## 2016-06-26 NOTE — Telephone Encounter (Signed)
RN call patients home number about the aricept dosage.  Pts daughter answer the phone. Pts daughter Annette Munoz is on the DPR form. Pts daughter stated Dr. Roda ShuttersXu discuss increasing the aricept dosage to 10mg  if patient tolerated the current dosage. Pts daughter also stated he mom had another stroke and was in the hospital on 06/19/2016 and was admitted. Pt was also in the ED on 06/23/2016 for more weakness but the test did not show any stroke on scans. Pts daughter stated the hospital thinks it was a TIA. Rn stated Dr. Roda ShuttersXu is working in the hospital this week, and a message will be sent to him. Pts daughter verbalized understanding.

## 2016-06-27 ENCOUNTER — Encounter: Payer: Self-pay | Admitting: *Deleted

## 2016-06-27 ENCOUNTER — Other Ambulatory Visit: Payer: Self-pay | Admitting: Neurology

## 2016-06-27 ENCOUNTER — Ambulatory Visit: Payer: Commercial Managed Care - HMO | Admitting: *Deleted

## 2016-06-27 ENCOUNTER — Ambulatory Visit: Payer: Commercial Managed Care - HMO | Admitting: Physical Therapy

## 2016-06-27 DIAGNOSIS — M6281 Muscle weakness (generalized): Secondary | ICD-10-CM

## 2016-06-27 DIAGNOSIS — R278 Other lack of coordination: Secondary | ICD-10-CM

## 2016-06-27 DIAGNOSIS — R2689 Other abnormalities of gait and mobility: Secondary | ICD-10-CM

## 2016-06-27 DIAGNOSIS — R2681 Unsteadiness on feet: Secondary | ICD-10-CM

## 2016-06-27 DIAGNOSIS — I6330 Cerebral infarction due to thrombosis of unspecified cerebral artery: Secondary | ICD-10-CM

## 2016-06-27 DIAGNOSIS — IMO0002 Reserved for concepts with insufficient information to code with codable children: Secondary | ICD-10-CM

## 2016-06-27 MED ORDER — DONEPEZIL HCL 10 MG PO TABS: 10.0000 mg | ORAL_TABLET | Freq: Every day | ORAL | 3 refills | Status: DC

## 2016-06-27 NOTE — Patient Instructions (Signed)
KNEE: Extension, Short Arc Quads - Supine    Place bolster under knees. Raise right leg until knee is straight. Do 10 times once a day.   Copyright  VHI. All rights reserved.   Hip Flexion / Knee Extension: Straight-Leg Raise (Eccentric)    Lie on back. Lift right leg with knee straight and toes point toward ceiling.  Slowly lower leg back to mat/bed.  Do 10 times once a day.   Copyright  VHI. All rights reserved.   Adduction: Hip - Knees Together (Hook-Lying)    Lie with hips and knees bent, towel roll between knees. Push knees together. Hold for 5 seconds. Repeat 10 times. Do once a day.   Copyright  VHI. All rights reserved.    KNEE: Extension, Long Arc Quads - Sitting    Raise right leg until knee is straight.  Do 10 times once a day.   Copyright  VHI. All rights reserved.  Abduction / Adduction: Controlled Motion (Supine)    Position (A) Patient: Bend right leg, knee to side. Helper: Hold one hand poised at midline. Motion (B) - Patient uses helper's hand as target to move leg into center position. - Helper guards leg with other hand. Hold 5 seconds. Repeat 10 times. Do once per day.   Copyright  VHI. All rights reserved.   Ankle Bend (Dorsiflexion and Plantar Flexion)    Sitting or lying down, point toes up, keeping both heels on floor. Then press toes to floor, raising heels. Repeat 10 times. Do 1 sessions per day.  [email or web address].exer.[email or web address]   Copyright  VHI. All rights reserved.

## 2016-06-27 NOTE — Therapy (Signed)
[facility] [address] [place], [place], [address] Phone: [phone]   Fax:  [phone]  Physical Therapy Treatment  Patient Details  Name: Annette Munoz MRN: [id number] Date of Birth: 1966-04-14 Referring Provider: Marvel Plan  Encounter Date: 06/27/2016      PT End of Session - 06/27/16 1300    Visit Number 2   Number of Visits 19   Date for PT Re-Evaluation 08/25/16   Authorization Type [employer] 60 visit limit combined   PT Start Time 1150   PT Stop Time 1231   PT Time Calculation (min) 41 min   Equipment Utilized During Treatment Gait belt   Activity Tolerance Patient tolerated treatment well   Behavior During Therapy Impulsive  R sided neglect      Past Medical History:  Diagnosis Date  . Diabetes mellitus without complication (HCC)   . Hypertension   . Stroke [facility])     Past Surgical History:  Procedure Laterality Date  . CESAREAN SECTION    . TEE WITHOUT CARDIOVERSION N/A 08/10/2015   Procedure: TRANSESOPHAGEAL ECHOCARDIOGRAM (TEE);  Surgeon: Chrystie Nose, MD;  Location: [facility] ENDOSCOPY;  Service: Cardiovascular;  Laterality: N/A;    There were no vitals filed for this visit.      Subjective Assessment - 06/27/16 1245    Subjective Pt denies changes since last visit.   Patient is accompained by: Family member  husband and daughter   Pertinent History HTN, DM, CVA (x 2), with recent TIA per family report; weakness on R side new since 06/18/16   Patient Stated Goals Pt's goals for therapy are to "do everything I need to do."   Currently in Pain? No/denies            [facility] PT Assessment - 06/26/16 6962      Assessment   Medical Diagnosis CVA   Referring Provider Marvel Plan   Onset Date/Surgical Date 06/18/16     Precautions   Precautions Fall  Avoid driving until therapy completed     Balance Screen   Has the patient fallen in the past 6 months Yes   How many times? 3   Has the  patient had a decrease in activity level because of a fear of falling?  No   Is the patient reluctant to leave their home because of a fear of falling?  No     Home Environment   Living Environment Private residence   Living Arrangements Spouse/significant other;Children   Available Help at Discharge Family  Needs help with shower, walking, safety   Type of Home House   Home Access Stairs to enter   Entrance Stairs-Number of Steps 8   Entrance Stairs-Rails Left  ascending; needs family assistance   Home Layout One level   Home Equipment Walker - 2 wheels     Prior Function   Level of Independence Independent with basic ADLs;Independent with household mobility without device  Now needs assistance of family   Vocation Part time employment  Designer, industrial/product, 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     Cognition   Overall Cognitive Status Impaired/Different from baseline  Per speech assessment     Sensation   Light Touch Appears Intact  to light touch     Posture/Postural Control   Posture/Postural Control Postural limitations   Postural Limitations Rounded Shoulders;Forward head;Posterior pelvic tilt   Posture Comments Holds RLE in external roation and eversion at ankle in sitting     Tone  Assessment Location Right Lower Extremity     ROM / Strength   AROM / PROM / Strength AROM;Strength     AROM   Overall AROM  Deficits   Overall AROM Comments decreased full hip/knee motions-compensates with trunk extension into chair     Strength   Overall Strength Deficits   Strength Assessment Site Hip;Knee;Ankle   Right/Left Hip Right;Left   Right Hip Flexion 3-/5   Left Hip Flexion 3+/5   Right/Left Knee Right;Left   Right Knee Flexion 3-/5   Right Knee Extension 4/5   Left Knee Flexion 3-/5   Left Knee Extension 4/5   Right/Left Ankle Right;Left   Right Ankle Dorsiflexion 1/5   Right Ankle Inversion 0/5   Left Ankle Dorsiflexion 4-/5     Transfers   Transfers Sit to Stand;Stand  to Sit   Sit to Stand 4: Min assist;With upper extremity assist;With armrests;From chair/3-in-1;Uncontrolled descent   Sit to Stand Details Tactile cues for sequencing;Tactile cues for placement;Tactile cues for weight beaing;Verbal cues for technique;Verbal cues for sequencing   Stand to Sit 4: Min assist;With upper extremity assist;To chair/3-in-1;Uncontrolled descent   Stand to Sit Details (indicate cue type and reason) Tactile cues for sequencing;Tactile cues for placement;Verbal cues for sequencing;Verbal cues for technique   Comments Pt tends to reach for walker when standing up, and when she prepares to turn to sit, she does not fully turn and has uncontrolled descent.     Ambulation/Gait   Ambulation/Gait Yes   Ambulation/Gait Assistance 4: Min assist;3: Mod assist   Ambulation/Gait Assistance Details Pt has decreased R foot clearance and R knee buckling at times, requiring therapist assist to regain balance.  Pt appears to veer to the R side, R neglect noted.   Ambulation Distance (Feet) 60 Feet   Assistive device Rolling walker   Gait Pattern Step-to pattern;Step-through pattern;Decreased step length - right;Decreased stance time - right;Decreased dorsiflexion - right;Decreased weight shift to right;Right foot flat;Right genu recurvatum;Poor foot clearance - right   Ambulation Surface Level;Indoor   Gait velocity 37.85 sec = 0.87 ft/sec   Stairs Yes   Stairs Assistance 3: Mod assist   Stairs Assistance Details (indicate cue type and reason) verbal and tactile cues given for foot placement and hand placement   Stair Management Technique One rail Left;Step to pattern  Simulating L rail ascending at home     Standardized Balance Assessment   Standardized Balance Assessment Timed Up and Go Test;Berg Balance Test     Berg Balance Test   Sit to Stand Needs minimal aid to stand or to stabilize   Standing Unsupported Unable to stand 30 seconds unassisted   Sitting with Back Unsupported  but Feet Supported on Floor or Stool Able to sit 2 minutes under supervision   Stand to Sit Sits independently, has uncontrolled descent   Transfers Needs one person to assist   Standing Unsupported with Eyes Closed Needs help to keep from falling   Standing Ubsupported with Feet Together Needs help to attain position and unable to hold for 15 seconds   From Standing, Reach Forward with Outstretched Arm Loses balance while trying/requires external support   From Standing Position, Pick up Object from Floor Unable to try/needs assist to keep balance   From Standing Position, Turn to Look Behind Over each Shoulder Needs assist to keep from losing balance and falling   Turn 360 Degrees Needs assistance while turning   Standing Unsupported, Alternately Place Feet on Step/Stool Needs assistance to  keep from falling or unable to try   Standing Unsupported, One Foot in [employer] Loses balance while stepping or standing   Standing on One Leg Unable to try or needs assist to prevent fall   Total Score 6   Berg comment: Scores <45/56 indicate increased fall risk     Timed Up and Go Test   Normal TUG (seconds) 35.85   TUG Comments Scores >13.5 seconds indicate increased fall risk; >30 seconds indicate difficulty with ADLs in the home.     RLE Tone   RLE Tone --  decreased tone noted RLE            [facility] Adult PT Treatment/Exercise - 06/27/16 0001      Transfers   Transfers Sit to Stand;Stand to Sit   Sit to Stand 4: Min assist;With upper extremity assist;With armrests;From chair/3-in-1;Uncontrolled descent   Sit to Stand Details Tactile cues for sequencing;Tactile cues for placement;Tactile cues for weight beaing;Verbal cues for technique;Verbal cues for sequencing   Stand to Sit 4: Min assist;With upper extremity assist;To chair/3-in-1;Uncontrolled descent   Stand to Sit Details (indicate cue type and reason) Tactile cues for sequencing;Tactile cues for placement;Verbal cues for  sequencing;Verbal cues for technique   Comments Pt tends to reach for walker when standing up, and when she prepares to turn to sit, she does not fully turn and has uncontrolled descent.     Ambulation/Gait   Ambulation/Gait Yes   Ambulation/Gait Assistance 4: Min assist;3: Mod assist   Ambulation Distance (Feet) 110 Feet  75 x 1   Assistive device Rolling walker   Gait Pattern Step-to pattern;Step-through pattern;Decreased step length - right;Decreased stance time - right;Decreased dorsiflexion - right;Decreased weight shift to right;Right foot flat;Right genu recurvatum;Poor foot clearance - right   Ambulation Surface Level;Indoor   Gait Comments Trialed R toe off and R walk on [place].  Pt with improved foot clearance on R but continues to need cues to fully advance RLE and perform heel strike.  Pt had several episodes of R foot catching during gait and needed mod assist to recover.  Pt impulsive and with decreased attention to the R.     Exercises   Exercises Knee/Hip;Ankle     Knee/Hip Exercises: Seated   Long Arc Quad Right;10 reps;Strengthening     Knee/Hip Exercises: Supine   Short Arc Quad Sets Right;Strengthening;10 reps   Hip Adduction Isometric Right;Strengthening;10 reps   Bridges Both;10 reps   Straight Leg Raises Strengthening;Right;10 reps   Other Supine Knee/Hip Exercises bil hip adduction in hooklying x 10 one set with pillow then another isolated on RLE only in hooklying     Ankle Exercises: Seated   Other Seated Ankle Exercises ankle dorsi/plantar flexion x 5                PT Education - 06/27/16 1259    Education provided Yes   Education Details HEP, Purpose of R AFO and process for obtaining   Person(s) Educated Patient;Spouse;Child(ren)   Methods Explanation;Demonstration;Verbal cues;Handout   Comprehension Verbalized understanding;Need further instruction          PT Short Term Goals - 06/26/16 1252      PT SHORT TERM GOAL #1   Title  Pt/family will verbalize/demo understanding of HEP for strength, balance, transfers, and gait.  TARGET 07/25/16   Time 4   Period Weeks   Status New     PT SHORT TERM GOAL #2   Title Pt will perform sit<>stand transfers with  correct sequence, with supervision, 8 of 10 trials, for improved safety and efficiency with transfers.   Time 4   Period Weeks   Status New     PT SHORT TERM GOAL #3   Title Pt will improve TUG score to less than or equal to 28 seconds for decreased fall risk.   Time 4   Period Weeks   Status New     PT SHORT TERM GOAL #4   Title Pt will negotiate at least 4 steps using 1 handrail, with min asistance, for improved safety with home stair negotiation.   Time 4   Period Weeks   Status New     PT SHORT TERM GOAL #5   Title Pt will improve Berg score by at least 5 points (>11/56) for decreased fall risk.   Time 4   Period Weeks   Status New           PT Long Term Goals - 06/26/16 1256      PT LONG TERM GOAL #1   Title Pt/family will verbalize understanding of fall prevention within the home environment.  TARGET 08/25/16   Time 8   Period Weeks   Status New     PT LONG TERM GOAL #2   Title Pt will improve gait velocity to at least 1.3 ft/sec for improved household gait.   Time 8   Period Weeks   Status New     PT LONG TERM GOAL #3   Title Pt will improve TUG score to less than or equal to 20 seconds for decreased fall risk.   Time 8   Period Weeks     PT LONG TERM GOAL #4   Title Pt will improve Berg Balance score to at least 21/56 for decreased fall risk.   Time 8   Period Weeks   Status New     PT LONG TERM GOAL #5   Title Pt will ambulate at least 500 ft, indoor/outdoor surfaces with supervision, for improved household and community ambulation.   Time 8   Period Weeks   Status New     Additional Long Term Goals   Additional Long Term Goals Yes     PT LONG TERM GOAL #6   Title Pt will negotiate at least 4 steps, 1 rail, with  supervision of family, for improved safety with stair negotiation into/out of the home.   Time 8   Period Weeks   Status New               Plan - 06/27/16 1301    Clinical Impression Statement Pt with improved gait with R AFO.  Question if pt may need custom AFO due to pt reports being diabetic.  Initiated HEP today.  Continue PT per POC.   Rehab Potential Fair   Clinical Impairments Affecting Rehab Potential Cognition (pt does have family support of husband and daughter)   PT Frequency 2x / week   PT Duration Other (comment)  9 weeks; plus eval   PT Treatment/Interventions ADLs/Self Care Home Management;Functional mobility training;Stair training;Gait training;DME Instruction;Therapeutic activities;Therapeutic exercise;Balance training;Neuromuscular re-education;Patient/family education;Orthotic Fit/Training   PT Next Visit Plan Review HEP for lower extremity strengthening, transfers, gait   Recommended Other Services Pt may need AFO for RLE   Consulted and Agree with Plan of Care Patient;Family member/caregiver   Family Member Consulted husband and daughter      Patient will benefit from skilled therapeutic intervention in order to improve the following deficits  and impairments:  Abnormal gait, Decreased balance, Decreased cognition, Decreased mobility, Decreased knowledge of use of DME, Decreased safety awareness, Decreased strength, Difficulty walking, Postural dysfunction, Impaired tone  Visit Diagnosis: Muscle weakness (generalized)  Other lack of coordination  Other abnormalities of gait and mobility  Unsteadiness on feet     Problem List Patient Active Problem List   Diagnosis Date Noted  . Cerebral infarction due to unspecified mechanism   . Gait difficulty   . Memory loss 05/22/2016  . Confusion   . Acute ischemic stroke (HCC) 05/18/2016  . Morbid obesity (HCC) 09/30/2015  . Uncontrolled type 2 diabetes mellitus with circulatory disorder, without  long-term current use of insulin (HCC)   . Essential hypertension   . Cerebrovascular accident (CVA) due to thrombosis of cerebral artery (HCC)   . CVA (cerebral infarction) 08/07/2015  . Pure hypercholesterolemia 10/04/2009    Newell Coral 06/27/2016, 1:03 PM  [facility] [address] [place], [place], [address] Phone: [phone]   Fax:  [phone]  Name: Annette Munoz MRN: [id number] Date of Birth: 01-31-1966   Newell Coral, [place] [facility] 06/27/16 1:03 PM Phone: [phone] Fax: [phone]

## 2016-06-27 NOTE — Addendum Note (Signed)
Addended by: Marvel PlanXU, Catharina Pica on: 06/27/2016 06:44 PM   Modules accepted: Orders

## 2016-06-27 NOTE — Telephone Encounter (Signed)
Hi, Annette Munoz:  Her charts and medication reviewed. If she is tolerating acricept, please ask her to increase to 2 tablets 10mg  daily. I will prescribe her the 10 mg tablets to her pharmacy so that she will take one tablet 10mg  daily she gets the new medication from pharmacy.   Also ask pt to compliant with medication and work hard with PCP to maintain BP and glucose in good range, healthy diet and weight loss.   Annette PlanJindong Adhya Cocco, MD PhD Stroke Neurology 06/27/2016 6:43 PM  Meds ordered this encounter  Medications  . donepezil (ARICEPT) 10 MG tablet    Sig: Take 1 tablet (10 mg total) by mouth at bedtime.    Dispense:  90 tablet    Refill:  3

## 2016-06-27 NOTE — Progress Notes (Signed)
I have placed order for right AFO. Hope it is the right order. Thanks.  Marvel PlanJindong Jeffery Bachmeier, MD PhD Stroke Neurology 06/27/2016 7:20 PM

## 2016-06-27 NOTE — Therapy (Signed)
[facility] [address] [place], [name], [address] Phone: [phone]   Fax:  [phone]  Occupational Therapy Evaluation  Patient Details  Name: Annette SanerBetty H Munoz MRN: [id number] Date of Birth: 10/30/1965 Referring Provider: Zenia ResidesSr Prashant K. Thedore MinsSingh  Encounter Date: 06/27/2016      OT End of Session - 06/27/16 1202    Visit Number 1   Number of Visits 16   Date for OT Re-Evaluation 08/22/16   Authorization Type [employer] H[employer] 60 Visit Limit Combined   Authorization - Visit Number 1   Authorization - Number of Visits 16   OT Start Time 1110   OT Stop Time 1152   OT Time Calculation (min) 42 min   Activity Tolerance Patient tolerated treatment well   Behavior During Therapy Impulsive  Right sided neglect      Past Medical History:  Diagnosis Date  . Diabetes mellitus without complication (HCC)   . Hypertension   . Stroke [facility](HCC)     Past Surgical History:  Procedure Laterality Date  . CESAREAN SECTION    . TEE WITHOUT CARDIOVERSION N/A 08/10/2015   Procedure: TRANSESOPHAGEAL ECHOCARDIOGRAM (TEE);  Surgeon: Chrystie NoseKenneth C Hilty, MD;  [facility];  Service: Cardiovascular;  Laterality: N/A;    There were no vitals filed for this visit.      Subjective Assessment - 06/27/16 1114    Subjective  Pt presents to out-pt OT for eval and assessment today per Dr Thedore MinsSingh follow dx CVA w/ R sided weakness. Pt denies pain.   Patient is accompained by: Family member  Husband and daughter   Currently in Pain? No/denies   Pain Score 0-No pain           [facility] OT Assessment - 06/27/16 0001      Assessment   Diagnosis CVA with R sided weakess   Referring Provider Sr Prashant K. Singh   Onset Date 06/16/16   Prior Therapy Pt has acute therapy PT/OT per family report     Precautions   Precautions Fall  No driving     Balance Screen   Has the patient fallen in the past 6 months Yes   How many times? 3 times in last 6 months   Has the patient had a decrease in activity level because of a fear of falling?  Yes   Is the patient reluctant to leave their home because of a fear of falling?  Yes     Home  Environment   Family/patient expects to be discharged to: Private residence   Living Arrangements Spouse/significant other  Spouse and daughter   Available Help at Discharge Family   Type of Home House   Home Access Stairs  8-10 STE w/ difficulty   Home Layout One level   Bathroom Shower/Tub Tub/Shower unit;Walk-in Water quality scientisthower   Shower/tub characteristics Curtain   Bathroom Toilet Standard   Bathroom Accessibility Yes   How accessible Accessible via Hydrographic surveyorwalker   Home Equipment Walker - 2 wheels   Additional Comments Pt family reports that they do not have 3:1 and husband denies need. They do report that pt would benfit from shower chair and state that they have called the MD office for a prescription   Lives With Significant other;Daughter     Prior Function   Level of Independence Independent   Vocation Part time employment   Vocation Requirements Hairdresser     ADL   Eating/Feeding Minimal assistance   Grooming --  Min A with R currently using L for grooming   Upper Body Bathing Moderate assistance   Lower Body Bathing Moderate assistance   Upper Body Dressing Moderate assistance   Lower Body Dressing Moderate assistance   Toilet Tranfer Moderate assistance   Toileting - Clothing Manipulation Moderate assistance   Toileting -  Hygiene Moderate assistance   Tub/Shower Transfer Moderate assistance   ADL comments Pt is currently getting assistance for all DL and functional moblity related to ADL's. Family ambulates with her for safety.     IADL   Prior Level of Function Shopping Independent   Light Housekeeping Needs help with all home maintenance tasks   Meal Prep Needs to have meals prepared and served   Medication Management Has difficulty remembering to take  medication;Is not capable of dispensing or managing own medication   Financial Management Is not capable of dispensing or managing own medication;Requires supervision/minimal cuing     Mobility   Mobility Status History of falls;Needs assist     Written Expression   Dominant Hand Right   Handwriting Not legible;Increased time     Vision - History   Baseline Vision Other (comment)  Pt denies but family reports difficulty w/ vision   Visual History Other (comment)  Wears glasses   Patient Visual Report Peripheral vision impairment;Other (comment)  Visual field deficit on right/neglect. Has eye Dr appt 9/18     Vision Assessment   Vision Assessment Vision impaired  _ to be further tested in functional context   Tracking/Visual Pursuits Decreased smoothness of horizontal tracking   Visual Fields Right visual field deficit   Diplopia Assessment --  Daughter reports that pt c/o double vision, pt denies     Activity Tolerance   Activity Tolerance Tolerates 10-20 min activity with muiltiple rests   Activity Tolerance Comments Pt/family report decreased endurance and activity tolerance, changes in cognition.     Cognition   Overall Cognitive Status Impaired/Different from baseline   Area of Impairment Orientation;Attention;Safety/judgement;Awareness;Problem solving   Memory Decreased recall of precautions;Decreased short-term memory   Safety/Judgement Decreased awareness of safety;Decreased awareness of deficits   Awareness Intellectual   Problem Solving Slow processing;Decreased initiation;Difficulty sequencing;Requires verbal cues;Requires tactile cues   Attention Sustained   Sustained Attention Impaired   Sustained Attention Impairment Verbal basic;Functional basic   Memory Impaired   Memory Impairment Storage deficit;Retrieval deficit;Decreased short term memory   Decreased Short Term Memory Verbal basic;Functional basic   Awareness Impaired   Awareness Impairment Intellectual  impairment   Problem Solving Impaired     Sensation   Light Touch Appears Intact     Coordination   Gross Motor Movements are Fluid and Coordinated No   Fine Motor Movements are Fluid and Coordinated No   Box and Blocks R = 13; L = 46   Coordination Impaired     ROM / Strength   AROM / PROM / Strength AROM;Strength     AROM   Overall AROM  Deficits   Overall AROM Comments Decreased AROM for R UE overall. Shoulder AROM impaired especially at end range.  Unable to actively supinate forearm Right.   AROM Assessment Site Shoulder   Right/Left Shoulder Right     Strength   Overall Strength Deficits   Overall Strength Comments R UE grossly -3/5 vs L 5/5     Hand Function   Right Hand Grip (lbs) 19#   Left Hand Grip (lbs) 42#  OT Education - 06/27/16 1200    Education provided Yes   Education Details R sided awareness/deficits; Assessment findings and recommendations. Safety DME recommended for ADL's (Ie: 3:1 and shower chair- pt husband currently declining "We don't need that" while daughter states "We could use that").   Person(s) Educated Patient;Spouse;Child(ren)   Methods Explanation;Demonstration;Verbal cues   Comprehension Need further instruction          OT Short Term Goals - 06/27/16 1226      OT SHORT TERM GOAL #1   Title Pt will be Mod I signs and symtpoms of CVA/TIA   Time 4   Period Weeks   Status New     OT SHORT TERM GOAL #2   Title Pt will be Mod I home program RUE   Time 4   Period Weeks   Status New     OT SHORT TERM GOAL #3   Title Pt/family will I state 2-3 pieces of a/e &/or DME for home use for increased independnece and safety   Time 4   Period Weeks   Status New           OT Long Term Goals - 06/27/16 1221      OT LONG TERM GOAL #1   Title Pt will be Min A LB/UB Dressing and ADL's   Baseline Mod-max A   Time 8   Period Weeks   Status New     OT LONG TERM GOAL #2   Title Pt will be  Mod I-supervision updated HEP RUE   Time 8   Period Weeks   Status New     OT LONG TERM GOAL #3   Title Pt will demonstrate improved RUE strength as seen by 3+/5 MMT   Baseline -3/5 RUE   Time 8   Period Weeks   Status New     OT LONG TERM GOAL #4   Title Pt will demonstrate improved R hand coordination and grip strength as seen by improved Box & Blocks to 30 or greater and grip 30# or greater R.   Baseline Grip 19#; Box and Blocks = 13 (06/27/16)   Time 8   Period Weeks   Status New     OT LONG TERM GOAL #5   Title Pt will demonstrate improved functional mobility and balance as seen by supervision assist for simulated shower transfers   Baseline Moderate (06/27/16)   Time 8   Period Weeks   Status New               Plan - 06/27/16 1207    Clinical Impression Statement Pt is a 50 year old RHD female who presents to Out-pt Neuro OT with history of multiple CVA (See EPIC for full PMH; 08/07/15 family reports memory affected & with recent CVA/TIA early September 2017), resulting in R sided weakness.  Family present to provide history, as pt not accurately answering questions during assessment secondary to impaired cognition. Family reports ED visit on 06/23/16 after recent neurologist visit, with increased R sided weakness, and pt is now having to use walker and have family assistance at home for all functional mobility, transfers and ADL's.  Pt w/ h/o 3 falls in the past 6 months, one in early September.  Family reports prior to recent CVA/TIA, pt was independent at home.  Pt presents with decreased strength, decreased coordination, generalized weakness, impaired balance, decreased safety awareness, R sided neglect & is requiring asssitance for all ADL's, IADL's at this time.  Pt is significant fall risk & should benefit from skilled OT to address stated deficits & to assist with improving functional use of RUE & safety w/ ADL's, .as well as DME/a/e recommendations & pt/family education.    Rehab Potential Fair   Clinical Impairments Affecting Rehab Potential Cognition   OT Frequency 2x / week   OT Duration 8 weeks   OT Treatment/Interventions Self-care/ADL training;Therapeutic exercise;DME and/or AE instruction;Therapeutic activities;Patient/family education;Balance training;Neuromuscular education;Energy conservation;Passive range of motion;Therapeutic exercises;Visual/perceptual remediation/compensation   Plan Home program for AROM and self ROM RUE, coordination.   Consulted and Agree with Plan of Care Patient;Family member/caregiver   Family Member Consulted Spouse and daughter present throughout session/assessment today.      Patient will benefit from skilled therapeutic intervention in order to improve the following deficits and impairments:  Decreased balance, Decreased endurance, Impaired vision/preception, Decreased range of motion, Decreased knowledge of precautions, Decreased cognition, Decreased activity tolerance, Decreased coordination, Decreased knowledge of use of DME, Decreased safety awareness, Decreased strength, Impaired flexibility, Impaired UE functional use, Decreased mobility, Difficulty walking  Visit Diagnosis: Muscle weakness (generalized) - Plan: Ot plan of care cert/re-cert  Other lack of coordination - Plan: Ot plan of care cert/re-cert  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 Cerebral infarction due to unspecified mechanism   . Gait difficulty   . Memory loss 05/22/2016  . Confusion   . Acute ischemic stroke (HCC) 05/18/2016  . Morbid obesity (HCC) 09/30/2015  . Uncontrolled type 2 diabetes mellitus with circulatory disorder, without long-term current use of insulin (HCC)   . Essential hypertension   . Cerebrovascular accident (CVA) due to thrombosis of cerebral artery (HCC)   . CVA (cerebral  infarction) 08/07/2015  . Pure hypercholesterolemia 10/04/2009    Roselie AwkwardBarnhill, Amy Beth Dixon 06/27/2016, 12:34 PM  [facility] [address] [place], [name], [address] Phone: [phone]   Fax:  [phone]  Name: Annette SanerBetty H Beeck MRN: [id number] Date of Birth: 07/11/1966

## 2016-06-27 NOTE — Therapy (Signed)
[facility] [address] [place], [name], [address] Phone: [phone]   Fax:  [phone]  Occupational Therapy Evaluation  Patient Details  Name: Annette Munoz MRN: [id number] Date of Birth: 10/30/1965 Referring Provider: Zenia ResidesSr Prashant K. Thedore MinsSingh  Encounter Date: 06/27/2016      OT End of Session - 06/27/16 1202    Visit Number 1   Number of Visits 16   Date for OT Re-Evaluation 08/22/16   Authorization Type [employer] HMO 60 Visit Limit Combined   Authorization - Visit Number 1   Authorization - Number of Visits 16   OT Start Time 1110   OT Stop Time 1152   OT Time Calculation (min) 42 min   Activity Tolerance Patient tolerated treatment well   Behavior During Therapy Impulsive  Right sided neglect      Past Medical History:  Diagnosis Date  . Diabetes mellitus without complication (HCC)   . Hypertension   . Stroke [facility](HCC)     Past Surgical History:  Procedure Laterality Date  . CESAREAN SECTION    . TEE WITHOUT CARDIOVERSION N/A 08/10/2015   Procedure: TRANSESOPHAGEAL ECHOCARDIOGRAM (TEE);  Surgeon: Chrystie NoseKenneth C Hilty, MD;  [facility];  Service: Cardiovascular;  Laterality: N/A;    There were no vitals filed for this visit.      Subjective Assessment - 06/27/16 1114    Subjective  Pt presents to out-pt OT for eval and assessment today per Dr Thedore MinsSingh follow dx CVA w/ R sided weakness. Pt denies pain.   Patient is accompained by: Family member  Husband and daughter   Currently in Pain? No/denies   Pain Score 0-No pain           [facility] OT Assessment - 06/27/16 0001      Assessment   Diagnosis CVA with R sided weakess   Referring Provider Sr Prashant K. Singh   Onset Date 06/16/16   Prior Therapy Pt has acute therapy PT/OT per family report     Precautions   Precautions Fall  No driving     Balance Screen   Has the patient fallen in the past 6 months Yes   How many times? 3 times in last 6 months   Has the patient had a decrease in activity level because of a fear of falling?  Yes   Is the patient reluctant to leave their home because of a fear of falling?  Yes     Home  Environment   Family/patient expects to be discharged to: Private residence   Living Arrangements Spouse/significant other  Spouse and daughter   Available Help at Discharge Family   Type of Home House   Home Access Stairs  8-10 STE w/ difficulty   Home Layout One level   Bathroom Shower/Tub Tub/Shower unit;Walk-in Water quality scientisthower   Shower/tub characteristics Curtain   Bathroom Toilet Standard   Bathroom Accessibility Yes   How accessible Accessible via Hydrographic surveyorwalker   Home Equipment Walker - 2 wheels   Additional Comments Pt family reports that they do not have 3:1 and husband denies need. They do report that pt would benfit from shower chair and state that they have called the MD office for a prescription   Lives With Significant other;Daughter     Prior Function   Level of Independence Independent   Vocation Part time employment   Vocation Requirements Hairdresser     ADL   Eating/Feeding Minimal assistance   Grooming --  Min A with R currently using L for grooming   Upper Body Bathing Moderate assistance   Lower Body Bathing Moderate assistance  Mod - Max Assistance   Upper Body Dressing Moderate assistance   Lower Body Dressing Moderate assistance  Mod- Max A   Toilet Tranfer Moderate assistance   Toileting - Clothing Manipulation Moderate assistance   Toileting -  Hygiene Moderate assistance   Tub/Shower Transfer Moderate assistance   ADL comments Pt is currently getting assistance for all DL and functional moblity related to ADL's. Family ambulates with her for safety.     IADL   Prior Level of Function Shopping Independent   Light Housekeeping Needs help with all home maintenance tasks   Meal Prep Needs to have meals prepared and served   Medication Management  Has difficulty remembering to take medication;Is not capable of dispensing or managing own medication   Financial Management Is not capable of dispensing or managing own medication;Requires supervision/minimal cuing     Mobility   Mobility Status History of falls;Needs assist     Written Expression   Dominant Hand Right   Handwriting Not legible;Increased time     Vision - History   Baseline Vision Other (comment)  Pt denies but family reports difficulty w/ vision   Visual History Other (comment)  Wears glasses   Patient Visual Report Peripheral vision impairment;Other (comment)  Visual field deficit on right/neglect. Has eye Dr appt 9/18     Vision Assessment   Vision Assessment Vision impaired  _ to be further tested in functional context   Tracking/Visual Pursuits Decreased smoothness of horizontal tracking   Visual Fields Right visual field deficit   Diplopia Assessment --  Daughter reports that pt c/o double vision, pt denies     Activity Tolerance   Activity Tolerance Tolerates 10-20 min activity with muiltiple rests   Activity Tolerance Comments Pt/family report decreased endurance and activity tolerance, changes in cognition.     Cognition   Overall Cognitive Status Impaired/Different from baseline   Area of Impairment Orientation;Attention;Safety/judgement;Awareness;Problem solving   Memory Decreased recall of precautions;Decreased short-term memory   Safety/Judgement Decreased awareness of safety;Decreased awareness of deficits   Awareness Intellectual   Problem Solving Slow processing;Decreased initiation;Difficulty sequencing;Requires verbal cues;Requires tactile cues   Attention Sustained   Sustained Attention Impaired   Sustained Attention Impairment Verbal basic;Functional basic   Memory Impaired   Memory Impairment Storage deficit;Retrieval deficit;Decreased short term memory   Decreased Short Term Memory Verbal basic;Functional basic   Awareness Impaired    Awareness Impairment Intellectual impairment   Problem Solving Impaired     Sensation   Light Touch Appears Intact     Coordination   Gross Motor Movements are Fluid and Coordinated No   Fine Motor Movements are Fluid and Coordinated No   Box and Blocks R = 13; L = 46   Coordination Impaired     ROM / Strength   AROM / PROM / Strength AROM;Strength     AROM   Overall AROM  Deficits   Overall AROM Comments Decreased AROM for R UE overall. Shoulder AROM impaired especially at end range.  Unable to actively supinate forearm Right.   AROM Assessment Site Shoulder   Right/Left Shoulder Right     Strength   Overall Strength Deficits   Overall Strength Comments R UE grossly -3/5 vs L 5/5     Hand Function   Right Hand Grip (lbs) 19#   Left Hand Grip (lbs) 42#  OT Education - 06/27/16 1200    Education provided Yes   Education Details R sided awareness/deficits; Assessment findings and recommendations. Safety DME recommended for ADL's (Ie: 3:1 and shower chair- pt husband currently declining "We don't need that" while daughter states "We could use that").   Person(s) Educated Patient;Spouse;Child(ren)   Methods Explanation;Demonstration;Verbal cues   Comprehension Need further instruction          OT Short Term Goals - 06/27/16 1226      OT SHORT TERM GOAL #1   Title Pt will be Mod I signs and symtpoms of CVA/TIA   Time 4   Period Weeks   Status New     OT SHORT TERM GOAL #2   Title Pt will be Mod I home program RUE   Time 4   Period Weeks   Status New     OT SHORT TERM GOAL #3   Title Pt/family will I state 2-3 pieces of a/e &/or DME for home use for increased independnece and safety   Time 4   Period Weeks   Status New           OT Long Term Goals - 06/27/16 1221      OT LONG TERM GOAL #1   Title Pt will be Min A LB/UB Dressing and ADL's   Baseline Mod-max A   Time 8   Period Weeks   Status New     OT LONG  TERM GOAL #2   Title Pt will be Mod I-supervision updated HEP RUE   Time 8   Period Weeks   Status New     OT LONG TERM GOAL #3   Title Pt will demonstrate improved RUE strength as seen by 3+/5 MMT   Baseline -3/5 RUE   Time 8   Period Weeks   Status New     OT LONG TERM GOAL #4   Title Pt will demonstrate improved R hand coordination and grip strength as seen by improved Box & Blocks to 30 or greater and grip 30# or greater R.   Baseline Grip 19#; Box and Blocks = 13 (06/27/16)   Time 8   Period Weeks   Status New     OT LONG TERM GOAL #5   Title Pt will demonstrate improved functional mobility and balance as seen by supervision assist for simulated shower transfers   Baseline Moderate (06/27/16)   Time 8   Period Weeks   Status New               Plan - 06/27/16 1207    Clinical Impression Statement Pt is a 50 year old RHD female who presents to Out-pt Neuro OT with history of multiple CVA (See EPIC for full PMH; 08/07/15 family reports memory affected & with recent CVA/TIA early September 2017), resulting in R sided weakness.  Family present to provide history, as pt not accurately answering questions during assessment secondary to impaired cognition. Family reports ED visit on 06/23/16 after recent neurologist visit, with increased R sided weakness, and pt is now having to use walker and have family assistance at home for all functional mobility, transfers and ADL's.  Pt w/ h/o 3 falls in the past 6 months, one in early September.  Family reports prior to recent CVA/TIA, pt was independent at home.  Pt presents with decreased strength, decreased coordination, generalized weakness, impaired balance, decreased safety awareness, R sided neglect & is requiring asssitance for all ADL's, IADL's at this time.  Pt is significant fall risk & should benefit from skilled OT to address stated deficits & to assist with improving functional use of RUE & safety w/ ADL's, .as well as DME/a/e  recommendations & pt/family education.   Rehab Potential Fair   Clinical Impairments Affecting Rehab Potential Cognition   OT Frequency 2x / week   OT Duration 8 weeks   OT Treatment/Interventions Self-care/ADL training;Therapeutic exercise;DME and/or AE instruction;Therapeutic activities;Patient/family education;Balance training;Neuromuscular education;Energy conservation;Passive range of motion;Therapeutic exercises;Visual/perceptual remediation/compensation   Plan Home program for AROM and self ROM RUE, coordination.   Consulted and Agree with Plan of Care Patient;Family member/caregiver   Family Member Consulted Spouse and daughter present throughout session/assessment today.      Patient will benefit from skilled therapeutic intervention in order to improve the following deficits and impairments:  Decreased balance, Decreased endurance, Impaired vision/preception, Decreased range of motion, Decreased knowledge of precautions, Decreased cognition, Decreased activity tolerance, Decreased coordination, Decreased knowledge of use of DME, Decreased safety awareness, Decreased strength, Impaired flexibility, Impaired UE functional use, Decreased mobility, Difficulty walking  Visit Diagnosis: Muscle weakness (generalized) - Plan: Ot plan of care cert/re-cert  Other lack of coordination - Plan: Ot plan of care cert/re-cert  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 Cerebral infarction due to unspecified mechanism   . Gait difficulty   . Memory loss 05/22/2016  . Confusion   . Acute ischemic stroke (HCC) 05/18/2016  . Morbid obesity (HCC) 09/30/2015  . Uncontrolled type 2 diabetes mellitus with circulatory disorder, without long-term current use of insulin (HCC)   . Essential hypertension   . Cerebrovascular accident (CVA) due to thrombosis of  cerebral artery (HCC)   . CVA (cerebral infarction) 08/07/2015  . Pure hypercholesterolemia 10/04/2009    Annette Munoz, OTR/L 06/27/2016, 12:36 PM  [facility] [address] [place], [place], [address] Phone: [phone]   Fax:  [phone]  Name: Annette Munoz MRN: [id number] Date of Birth: May 09, 1966

## 2016-06-28 ENCOUNTER — Telehealth: Payer: Self-pay | Admitting: Neurology

## 2016-06-28 DIAGNOSIS — R569 Unspecified convulsions: Secondary | ICD-10-CM | POA: Insufficient documentation

## 2016-06-28 NOTE — Telephone Encounter (Signed)
Pts daughter called and said she will increase the medication. Does Dr. Roda ShuttersXu know what pts pcp was going on the the pt. Please call and advise [phone]

## 2016-06-28 NOTE — Telephone Encounter (Signed)
LFt vm for patients daughter that her mom needs to take one 10mg  at bedtime. Pt has been taking 5mg  at bedtime. Dr. Roda ShuttersXu wants pt to increase dosage. The new aricept rx was sent to the pharmacy.

## 2016-06-28 NOTE — Telephone Encounter (Addendum)
Yes. I will order an EEG to rule out seizure. Because the concern, according to [place] law, pt can not drive until episode free for 6 months. Please let the daughter and pt know. I will see her in one month as scheduled. Thanks.   Marvel PlanJindong Mercedees Convery, MD PhD Stroke Neurology 06/28/2016 6:40 PM  Orders Placed This Encounter  Procedures  . EEG    Standing Status:   Future    Standing Expiration Date:   06/28/2017    Order Specific Question:   Where should this test be performed?    Answer:   [facility]

## 2016-06-28 NOTE — Telephone Encounter (Signed)
Rn call patients daughter back about her moms Aricept dosage.Pts daughter stated they just refill the 5mg  this week. Rn stated Dr.Xu prescribed 10mg  or Aricept to take one pill at night. Pts daughter stated she spoke wit the pharmacist and they stated to take the two 5 mg until the refill runs out. Once the 5mg  refills runs out she can pick up the 10mg  at night. Pts daughter stated her mom had a episode prior to her hospitalization. Pt fell in the bathroom and had a incontinence episode, and her tongue was hanging out of her mouth. PTs PCP thinks it might of been a seizure, and wanted Dr. Roda ShuttersXu to know. Rn stated a message will be sent to Dr. Roda ShuttersXu about a possible seizure.

## 2016-06-28 NOTE — Progress Notes (Signed)
[facility]

## 2016-06-28 NOTE — Addendum Note (Signed)
Addended by: Marvel PlanXU, Girtie Wiersma on: 06/28/2016 06:43 PM   Modules accepted: Orders

## 2016-06-29 NOTE — Telephone Encounter (Signed)
Rn spoke with Mack HookKhadijah daughter about the EEG being schedule. PTs daughter Carollee HerterShannon in check out gave her a call to schedule. Rn stated per Dr. Roda ShuttersXu if its confirm she had a seizure she cannot drive for 6 months according to [facility] law. Rn stated pt should not be driving anymore until she has the EEG. Pts daughter stated she cannot drive because her right side is weak. PTs daughter verbalized understanding.

## 2016-07-03 ENCOUNTER — Ambulatory Visit: Payer: Commercial Managed Care - HMO | Admitting: Neurology

## 2016-07-03 ENCOUNTER — Encounter: Payer: Self-pay | Admitting: Physical Therapy

## 2016-07-03 ENCOUNTER — Ambulatory Visit: Payer: Commercial Managed Care - HMO | Admitting: Physical Therapy

## 2016-07-03 DIAGNOSIS — R2681 Unsteadiness on feet: Secondary | ICD-10-CM

## 2016-07-03 DIAGNOSIS — M6281 Muscle weakness (generalized): Secondary | ICD-10-CM

## 2016-07-03 DIAGNOSIS — R2689 Other abnormalities of gait and mobility: Secondary | ICD-10-CM

## 2016-07-04 NOTE — Therapy (Signed)
[facility] [address] [place], [place], [address] Phone: [phone]   Fax:  [phone]  Physical Therapy Treatment  Patient Details  Name: Annette Munoz MRN: [id number] Date of Birth: 03/17/1966 Referring Provider: Marvel Plan  Encounter Date: 07/03/2016      PT End of Session - 07/03/16 1024    Visit Number 3   Number of Visits 19   Date for PT Re-Evaluation 08/25/16   Authorization Type [employer] 60 visit limit combined   PT Start Time 1019   PT Stop Time 1100   PT Time Calculation (min) 41 min   Equipment Utilized During Treatment Gait belt   Activity Tolerance Patient tolerated treatment well   Behavior During Therapy Impulsive  R sided neglect      Past Medical History:  Diagnosis Date  . Diabetes mellitus without complication (HCC)   . Hypertension   . Stroke [facility])     Past Surgical History:  Procedure Laterality Date  . CESAREAN SECTION    . TEE WITHOUT CARDIOVERSION N/A 08/10/2015   Procedure: TRANSESOPHAGEAL ECHOCARDIOGRAM (TEE);  Surgeon: Chrystie Nose, MD;  Location: [facility] ENDOSCOPY;  Service: Cardiovascular;  Laterality: N/A;    There were no vitals filed for this visit.      Subjective Assessment - 07/03/16 1023    Subjective Had a fall this am. Pt walked to bathroom alone without her walker, slipped in bathroom on wet floor. Was assisted up by family. Pt denies pain. Unknown if pt hit head as it was unwittnessed.                          Patient is accompained by: Family member  daughter   Pertinent History HTN, DM, CVA (x 2), with recent TIA per family report; weakness on R side new since 06/18/16   Patient Stated Goals Pt's goals for therapy are to "do everything I need to do."   Currently in Pain? No/denies   Pain Score 0-No pain     Treatment: Pt performed all HEP exercises issued to date with handouts and min cues on correct form/technique. Pt's daughter present and shown how to  better assist/cue pt for improved form and technique at home.         [facility] Adult PT Treatment/Exercise - 07/03/16 1630      Transfers   Transfers Sit to Stand;Stand to Sit   Sit to Stand 4: Min assist;With upper extremity assist;With armrests;From chair/3-in-1;Uncontrolled descent   Sit to Stand Details Tactile cues for sequencing;Tactile cues for placement;Tactile cues for weight beaing;Verbal cues for technique;Verbal cues for sequencing   Sit to Stand Details (indicate cue type and reason) cues needed to scoot to edge, for hand palcement and for anterior weight shifting with standing to power up   Stand to Sit 4: Min assist;With upper extremity assist;To chair/3-in-1;Uncontrolled descent   Stand to Sit Details (indicate cue type and reason) Tactile cues for sequencing;Tactile cues for placement;Verbal cues for sequencing;Verbal cues for technique   Stand to Sit Details cues to turn and fully align with surface and to reach back with sitting down   Comments --     Ambulation/Gait   Ambulation/Gait Yes   Ambulation/Gait Assistance 4: Min assist;3: Mod assist   Ambulation/Gait Assistance Details trialed right posterior ottobock brace for 1st lap and then combinded with simulated toe cap on second lap improved foot clearance/foot advancement with second lap, however pt had multple  episodes of genu recurvatum and with cues for quad activitation to prevent this pt would buckle at right knee.                                    .   Ambulation Distance (Feet) 115 Feet  x 2 reps   Assistive device Rolling walker   Gait Pattern Step-to pattern;Step-through pattern;Decreased step length - right;Decreased stance time - right;Decreased dorsiflexion - right;Decreased weight shift to right;Right foot flat;Right genu recurvatum;Poor foot clearance - right         PT Short Term Goals - 06/26/16 1252      PT SHORT TERM GOAL #1   Title Pt/family will verbalize/demo understanding of HEP for strength,  balance, transfers, and gait.  TARGET 07/25/16   Time 4   Period Weeks   Status New     PT SHORT TERM GOAL #2   Title Pt will perform sit<>stand transfers with correct sequence, with supervision, 8 of 10 trials, for improved safety and efficiency with transfers.   Time 4   Period Weeks   Status New     PT SHORT TERM GOAL #3   Title Pt will improve TUG score to less than or equal to 28 seconds for decreased fall risk.   Time 4   Period Weeks   Status New     PT SHORT TERM GOAL #4   Title Pt will negotiate at least 4 steps using 1 handrail, with min asistance, for improved safety with home stair negotiation.   Time 4   Period Weeks   Status New     PT SHORT TERM GOAL #5   Title Pt will improve Berg score by at least 5 points (>11/56) for decreased fall risk.   Time 4   Period Weeks   Status New           PT Long Term Goals - 06/26/16 1256      PT LONG TERM GOAL #1   Title Pt/family will verbalize understanding of fall prevention within the home environment.  TARGET 08/25/16   Time 8   Period Weeks   Status New     PT LONG TERM GOAL #2   Title Pt will improve gait velocity to at least 1.3 ft/sec for improved household gait.   Time 8   Period Weeks   Status New     PT LONG TERM GOAL #3   Title Pt will improve TUG score to less than or equal to 20 seconds for decreased fall risk.   Time 8   Period Weeks     PT LONG TERM GOAL #4   Title Pt will improve Berg Balance score to at least 21/56 for decreased fall risk.   Time 8   Period Weeks   Status New     PT LONG TERM GOAL #5   Title Pt will ambulate at least 500 ft, indoor/outdoor surfaces with supervision, for improved household and community ambulation.   Time 8   Period Weeks   Status New     Additional Long Term Goals   Additional Long Term Goals Yes     PT LONG TERM GOAL #6   Title Pt will negotiate at least 4 steps, 1 rail, with supervision of family, for improved safety with stair negotiation  into/out of the home.   Time 8   Period Weeks   Status New  Plan - 07/03/16 1025    Clinical Impression Statement Todays session began by reviewing current HEP with pt and daughter. Cues provided to daughter on how to better assist pt and cue pt for correct form/technique at home. Remainder of session continued to address gait with use of orthotics to assist with foot clearance and decreased gait deviations. Minimal improvement noted in session. Have recieved order from MD for AFO. Will contact orthotist to get date for consult set up. Pt is making slow, steady progress toward goals. Should benefit from continued PT to progress toward unmet goals.                                                    Rehab Potential Fair   Clinical Impairments Affecting Rehab Potential Cognition (pt does have family support of husband and daughter)   PT Frequency 2x / week   PT Duration Other (comment)  9 weeks; plus eval   PT Treatment/Interventions ADLs/Self Care Home Management;Functional mobility training;Stair training;Gait training;DME Instruction;Therapeutic activities;Therapeutic exercise;Balance training;Neuromuscular re-education;Patient/family education;Orthotic Fit/Training   PT Next Visit Plan lower extremity strengthening, transfers, gait; orthotist consult at some future visit to be established.   Consulted and Agree with Plan of Care Patient;Family member/caregiver   Family Member Consulted husband and daughter      Patient will benefit from skilled therapeutic intervention in order to improve the following deficits and impairments:  Abnormal gait, Decreased balance, Decreased cognition, Decreased mobility, Decreased knowledge of use of DME, Decreased safety awareness, Decreased strength, Difficulty walking, Postural dysfunction, Impaired tone  Visit Diagnosis: Other abnormalities of gait and mobility  Unsteadiness on feet  Muscle weakness (generalized)     Problem  List Patient Active Problem List   Diagnosis Date Noted  . Seizure-like activity (HCC) 06/28/2016  . Cerebral infarction due to unspecified mechanism   . Gait difficulty   . Memory loss 05/22/2016  . Confusion   . Acute ischemic stroke (HCC) 05/18/2016  . Morbid obesity (HCC) 09/30/2015  . Uncontrolled type 2 diabetes mellitus with circulatory disorder, without long-term current use of insulin (HCC)   . Essential hypertension   . Cerebrovascular accident (CVA) due to thrombosis of cerebral artery (HCC)   . CVA (cerebral infarction) 08/07/2015  . Pure hypercholesterolemia 10/04/2009    Sallyanne Kuster, PTA, [facility]patient Neuro [facility] [address], [address] [place], [place] [address] [phone] 07/04/16, 12:30 PM   Name: DARCY BARBARA MRN: [id number] Date of Birth: [birth year]

## 2016-07-07 ENCOUNTER — Ambulatory Visit: Payer: Commercial Managed Care - HMO | Admitting: Occupational Therapy

## 2016-07-07 ENCOUNTER — Ambulatory Visit: Payer: Commercial Managed Care - HMO | Admitting: Speech Pathology

## 2016-07-07 ENCOUNTER — Ambulatory Visit: Payer: Commercial Managed Care - HMO | Admitting: Physical Therapy

## 2016-07-07 ENCOUNTER — Encounter: Payer: Self-pay | Admitting: Occupational Therapy

## 2016-07-07 ENCOUNTER — Encounter: Payer: Self-pay | Admitting: Physical Therapy

## 2016-07-07 DIAGNOSIS — M6281 Muscle weakness (generalized): Secondary | ICD-10-CM

## 2016-07-07 DIAGNOSIS — R278 Other lack of coordination: Secondary | ICD-10-CM

## 2016-07-07 DIAGNOSIS — R2681 Unsteadiness on feet: Secondary | ICD-10-CM

## 2016-07-07 DIAGNOSIS — R2689 Other abnormalities of gait and mobility: Secondary | ICD-10-CM | POA: Diagnosis not present

## 2016-07-07 DIAGNOSIS — R41841 Cognitive communication deficit: Secondary | ICD-10-CM

## 2016-07-07 DIAGNOSIS — I69315 Cognitive social or emotional deficit following cerebral infarction: Secondary | ICD-10-CM

## 2016-07-07 NOTE — Patient Instructions (Addendum)
Fall Prevention in the Home  Falls can cause injuries and can affect people from all age groups. There are many simple things that you can do to make your home safe and to help prevent falls. WHAT CAN I DO ON THE OUTSIDE OF MY HOME?  Regularly repair the edges of walkways and driveways and fix any cracks.  Remove high doorway thresholds.  Trim any shrubbery on the main path into your home.  Use bright outdoor lighting.  Clear walkways of debris and clutter, including tools and rocks.  Regularly check that handrails are securely fastened and in good repair. Both sides of any steps should have handrails.  Install guardrails along the edges of any raised decks or porches.  Have leaves, snow, and ice cleared regularly.  Use sand or salt on walkways during winter months.  In the garage, clean up any spills right away, including grease or oil spills. WHAT CAN I DO IN THE BATHROOM?  Use night lights.  Install grab bars by the toilet and in the tub and shower. Do not use towel bars as grab bars.  Use non-skid mats or decals on the floor of the tub or shower.  If you need to sit down while you are in the shower, use a plastic, non-slip stool..  Keep the floor dry. Immediately clean up any water that spills on the floor.  Remove soap buildup in the tub or shower on a regular basis.  Attach bath mats securely with double-sided non-slip rug tape.  Remove throw rugs and other tripping hazards from the floor. WHAT CAN I DO IN THE BEDROOM?  Use night lights.  Make sure that a bedside light is easy to reach.  Do not use oversized bedding that drapes onto the floor.  Have a firm chair that has side arms to use for getting dressed.  Remove throw rugs and other tripping hazards from the floor. WHAT CAN I DO IN THE KITCHEN?   Clean up any spills right away.  Avoid walking on wet floors.  Place frequently used items in easy-to-reach places.  If you need to reach for something  above you, use a sturdy step stool that has a grab bar.  Keep electrical cables out of the way.  Do not use floor polish or wax that makes floors slippery. If you have to use wax, make sure that it is non-skid floor wax.  Remove throw rugs and other tripping hazards from the floor. WHAT CAN I DO IN THE STAIRWAYS?  Do not leave any items on the stairs.  Make sure that there are handrails on both sides of the stairs. Fix handrails that are broken or loose. Make sure that handrails are as long as the stairways.  Check any carpeting to make sure that it is firmly attached to the stairs. Fix any carpet that is loose or worn.  Avoid having throw rugs at the top or bottom of stairways, or secure the rugs with carpet tape to prevent them from moving.  Make sure that you have a light switch at the top of the stairs and the bottom of the stairs. If you do not have them, have them installed. WHAT ARE SOME OTHER FALL PREVENTION TIPS?  Wear closed-toe shoes that fit well and support your feet. Wear shoes that have rubber soles or low heels.  When you use a stepladder, make sure that it is completely opened and that the sides are firmly locked. Have someone hold the ladder while you   are using it. Do not climb a closed stepladder.  Add color or contrast paint or tape to grab bars and handrails in your home. Place contrasting color strips on the first and last steps.  Use mobility aids as needed, such as canes, walkers, scooters, and crutches.  Turn on lights if it is dark. Replace any light bulbs that burn out.  Set up furniture so that there are clear paths. Keep the furniture in the same spot.  Fix any uneven floor surfaces.  Choose a carpet design that does not hide the edge of steps of a stairway.  Be aware of any and all pets.  Review your medicines with your healthcare provider. Some medicines can cause dizziness or changes in blood pressure, which increase your risk of falling. Talk  with your health care provider about other ways that you can decrease your risk of falls. This may include working with a physical therapist or trainer to improve your strength, balance, and endurance.   This information is not intended to replace advice given to you by your health care provider. Make sure you discuss any questions you have with your health care provider.   Document Released: 09/22/2002 Document Revised: 02/16/2015 Document Reviewed: 11/06/2014 Elsevier Interactive Patient Education 2016 [employer] Inc.  

## 2016-07-07 NOTE — Therapy (Signed)
[facility] [address] [place], [place], [address] Phone: [phone]   Fax:  [phone]  Occupational Therapy Treatment  Patient Details  Name: Annette Munoz MRN: [id number] Date of Birth: [birth year] Referring Provider: Zenia Resides. Thedore Mins  Encounter Date: 07/07/2016      OT End of Session - 07/07/16 1302    Visit Number 2   Number of Visits 16   Authorization - Visit Number 2   Authorization - Number of Visits 16   OT Start Time 1145   OT Stop Time 1230   OT Time Calculation (min) 45 min   Activity Tolerance Patient tolerated treatment well   Behavior During Therapy Flat affect;Impulsive      Past Medical History:  Diagnosis Date  . Diabetes mellitus without complication (HCC)   . Hypertension   . Stroke [facility])     Past Surgical History:  Procedure Laterality Date  . CESAREAN SECTION    . TEE WITHOUT CARDIOVERSION N/A 08/10/2015   Procedure: TRANSESOPHAGEAL ECHOCARDIOGRAM (TEE);  Surgeon: Chrystie Nose, MD;  Location: [facility] ENDOSCOPY;  Service: Cardiovascular;  Laterality: N/A;    There were no vitals filed for this visit.      Subjective Assessment - 07/07/16 1212    Subjective  I had a stroke   Patient is accompained by: --  Mosetta Putt - husband   Currently in Pain? No/denies   Pain Score 0-No pain                      OT Treatments/Exercises (OP) - 07/07/16 0001      ADLs   LB Dressing Patient's husband indicates patient i sbeing dressed by family members.  Patient seemingly unaware of this.  Patient instructed to take off and put on socks and shoes.  Patient with right hemiparesis, and lifts and drops right leg like a dead weight despite active movement.  Encouraged patient to slow down, and lift leg - when her attention was drawn to activity - she was able to perform safely.  Patient needs frequent overt cues for active attention to task at hand.  Patient with proprioceptive  deficit in right extremities, and weakness/ coordination deficits - but is far more impaired by decreased attention to task. Husband present and discussed the mental and physical benefits of allowing patient to participate in her own self care.       Neurological Re-education Exercises   Other Exercises 1 Patient with right inattention, poor proprioceptive awareness, and right sided weakness.  Used game of perfection to address coordination, strength, visual scanning, and attention.  Patient responded best to immediate and overt cueing for attention.                  OT Education - 07/07/16 1302    Education provided Yes   Education Details husband and patient about the benefit of safe, supervised particiaption in ADL   Person(s) Educated Patient;Spouse   Methods Demonstration   Comprehension Verbalized understanding;Need further instruction          OT Short Term Goals - 07/07/16 1308      OT SHORT TERM GOAL #1   Title Pt will be Mod I signs and symtpoms of CVA/TIA   Status Deferred     OT SHORT TERM GOAL #2   Title Pt will be Mod I home program RUE  supervision and mod cueing   Status Revised     OT SHORT TERM  GOAL #3   Title Pt/family will I state 2-3 pieces of a/e &/or DME for home use for increased independnece and safety   Status On-going     OT SHORT TERM GOAL #4   Title Patient will dress lower body with mod cueing and intermittent mod assistance following set up.   Time 4   Period Weeks   Status New     OT SHORT TERM GOAL #5   Title Patient will dress upper body with set up and minimal assistance   Time 4   Period Weeks   Status New           OT Long Term Goals - 06/27/16 1221      OT LONG TERM GOAL #1   Title Pt will be Min A LB/UB Dressing and ADL's   Baseline Mod-max A   Time 8   Period Weeks   Status New     OT LONG TERM GOAL #2   Title Pt will be Mod I-supervision updated HEP RUE   Time 8   Period Weeks   Status New     OT LONG  TERM GOAL #3   Title Pt will demonstrate improved RUE strength as seen by 3+/5 MMT   Baseline -3/5 RUE   Time 8   Period Weeks   Status New     OT LONG TERM GOAL #4   Title Pt will demonstrate improved R hand coordination and grip strength as seen by improved Box & Blocks to 30 or greater and grip 30# or greater R.   Baseline Grip 19#; Box and Blocks = 13 (06/27/16)   Time 8   Period Weeks   Status New     OT LONG TERM GOAL #5   Title Pt will demonstrate improved functional mobility and balance as seen by supervision assist for simulated shower transfers   Baseline Moderate (06/27/16)   Time 8   Period Weeks   Status New               Plan - 07/07/16 1303    Clinical Impression Statement Patient with severe cognitive deficits impeding particiaption in [place] safety.  Patient with limited to no awareness of her deficits following multiple strokes, and family is working to provide 24 hour supervision.     Rehab Potential Fair   Clinical Impairments Affecting Rehab Potential severity of deficits   OT Frequency 2x / week   OT Duration 8 weeks   OT Treatment/Interventions Self-care/ADL training;Therapeutic exercise;DME and/or AE instruction;Therapeutic activities;Patient/family education;Balance training;Neuromuscular education;Energy conservation;Passive range of motion;Therapeutic exercises;Visual/perceptual remediation/compensation   Plan ADL activity, bilateral coordination, attention.  Enjoys simple games per husband    Consulted and Agree with Plan of Care Patient;Family member/caregiver   Family Member Consulted Husband Mosetta PuttGrady      Patient will benefit from skilled therapeutic intervention in order to improve the following deficits and impairments:  Decreased balance, Decreased endurance, Impaired vision/preception, Decreased range of motion, Decreased knowledge of precautions, Decreased cognition, Decreased activity tolerance, Decreased coordination, Decreased knowledge of  use of DME, Decreased safety awareness, Decreased strength, Impaired flexibility, Impaired UE functional use, Decreased mobility, Difficulty walking  Visit Diagnosis: Unsteadiness on feet - Plan: Ot plan of care cert/re-cert  Muscle weakness (generalized) - Plan: Ot plan of care cert/re-cert  Other lack of coordination - Plan: Ot plan of care cert/re-cert  Cognitive social or emotional deficit following cerebral infarction - Plan: Ot plan of care cert/re-cert    Problem List Patient  Active Problem List   Diagnosis Date Noted  . Seizure-like activity (HCC) 06/28/2016  . Cerebral infarction due to unspecified mechanism   . Gait difficulty   . Memory loss 05/22/2016  . Confusion   . Acute ischemic stroke (HCC) 05/18/2016  . Morbid obesity (HCC) 09/30/2015  . Uncontrolled type 2 diabetes mellitus with circulatory disorder, without long-term current use of insulin (HCC)   . Essential hypertension   . Cerebrovascular accident (CVA) due to thrombosis of cerebral artery (HCC)   . CVA (cerebral infarction) 08/07/2015  . Pure hypercholesterolemia 10/04/2009    Collier Salina, OTR/L 07/07/2016, 4:43 PM  [facility] [address] [place], [place], [address] Phone: [phone]   Fax:  [phone]  Name: JYSSICA RIEF MRN: [id number] Date of Birth: Nov 17, 1965

## 2016-07-07 NOTE — Patient Instructions (Signed)
  Cognitive Activities you can do at home:   - Solitaire  - Majong  - Scrabble  - Chess/Checkers  - Crosswords (easy level)  - Juanna CaoUno  - Card Games  - Board Games  - Connect 4  - Simon  - the Memory Game  - Dominoes  - Jigsaw puzzle  - Word Search  To use memory book - date on top of page - write down what exercises, games, chores you have done that day - we will go over this in therapy. I don't care if it is messy writing.

## 2016-07-07 NOTE — Therapy (Signed)
[facility] [address] [place], [place], [address] Phone: [phone]   Fax:  [phone]  Speech Language Pathology Treatment  Patient Details  Name: Annette Munoz MRN: [id number] Date of Birth: 10/04/1966 Referring Provider: Dr. Renaye Rakers  Encounter Date: 07/07/2016      End of Session - 07/07/16 1348    Visit Number 2   Number of Visits 17   Date for SLP Re-Evaluation 09/08/16   Authorization Type I changed re-eval date as pt started tx 3 weeks after initial evaluation   SLP Start Time 1232   SLP Stop Time  1324   SLP Time Calculation (min) 52 min      Past Medical History:  Diagnosis Date  . Diabetes mellitus without complication (HCC)   . Hypertension   . Stroke [facility])     Past Surgical History:  Procedure Laterality Date  . CESAREAN SECTION    . TEE WITHOUT CARDIOVERSION N/A 08/10/2015   Procedure: TRANSESOPHAGEAL ECHOCARDIOGRAM (TEE);  Surgeon: Chrystie Nose, MD;  Location[facility] ENDOSCOPY;  Service: Cardiovascular;  Laterality: N/A;    There were no vitals filed for this visit.      Subjective Assessment - 07/07/16 1250    Subjective "I had a stroke?!!!?"   Patient is accompained by: Family member   Currently in Pain? No/denies               ADULT SLP TREATMENT - 07/07/16 1321      General Information   Behavior/Cognition Alert;Cooperative;Pleasant mood     Treatment Provided   Treatment provided Cognitive-Linquistic     Cognitive-Linquistic Treatment   Treatment focused on Cognition   Skilled Treatment Pt's daughter has initiated use of memory book. I reviewed the book and added her therapy calendar and further instructions for pt to write down her chores, therapy exercises and cognitive activities she does each day. I crossed out her calendar - pt required usual mod A to locate the date and the day over 3 attempts. Pt sustained attention to details on simle card sort with usual  min to mod cues to continue sorting. Selective attention with simple clock/time problem solving with slow processesing, mod A for attention to details in question and to ID errors. Pt requires written cues and max A to attend to written cues re: oreintation, situation and areas of impairment. Pt asked several times if she was going to work today. I wrote in her notebook that she had 3 strokes and is not going to work. Each time she read this, she was surprised to learn that she had a stroke.      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 Pt requires max A to use calendar for orientation and to read her therapy schedule. Ongoing written cues required re: the fact  that she has had strokes. Mod A for sustained attention and attention to detail on card sort and clock tasks. Continue skilled ST to maximize cognition, carryover of memory strategies and to reduce caregiver  Burden.  Homework provided.   Time 4   Period Weeks   Status On-going     SLP SHORT TERM GOAL #2   Title Pt/family will report use of external aids/memory book at home for ADL's and medication mangement over 3 sessions   Time 4   Period Weeks   Status On-going     SLP SHORT TERM GOAL #3   Title Pt will verbalize 3 cognitive impairments with mod A   Time 4   Period Weeks   Status On-going     SLP SHORT TERM GOAL #4   Title Pt will attend  to simple cognitive linguistic tasks in distracting enviroment for 10 mintues with occasional min A   Time 4   Period Weeks   Status On-going          SLP Long Term Goals - 07/07/16 1348      SLP LONG TERM GOAL #1   Title Pt/family will report use of external memory aids at home for successful f/u on daily schedule, safety precautions, chores, meds, etc over 5 sessions.   Time 8   Period Weeks   Status On-going     SLP LONG TERM GOAL #2   Title Pt will verbalize 3 safety precautions due to cognitive impairments with mod A.    Time 8   Period Weeks   Status On-going     SLP LONG TERM GOAL #3   Title Pt will solve simple reasoning, organization, time and money problems with 75% accuracy and occasional min A   Time 8   Period Weeks   Status On-going        Patient will benefit from skilled therapeutic intervention in order to improve the following deficits and impairments:   Cognitive communication deficit    Problem List Patient Active Problem List   Diagnosis Date Noted  . Seizure-like activity (HCC) 06/28/2016  . Cerebral infarction due to unspecified mechanism   . Gait difficulty   . Memory loss 05/22/2016  . Confusion   . Acute ischemic stroke (HCC) 05/18/2016  . Morbid obesity (HCC) 09/30/2015  . Uncontrolled type 2 diabetes mellitus with circulatory disorder, without long-term current use of insulin (HCC)   . Essential hypertension   . Cerebrovascular accident (CVA) due to thrombosis of cerebral artery (HCC)   . CVA (cerebral infarction) 08/07/2015  . Pure hypercholesterolemia 10/04/2009    Remiel Corti, Radene JourneyLaura Ann MS, CCC-SLP 07/07/2016, 1:50 PM  [facility] [address] [place], [name], [address] Phone: [phone]   Fax:  [phone]   Name: Salena SanerBetty H Hendricks MRN: [id number] Date of Birth: 07/17/1966

## 2016-07-10 ENCOUNTER — Ambulatory Visit: Payer: Commercial Managed Care - HMO | Admitting: *Deleted

## 2016-07-10 ENCOUNTER — Ambulatory Visit: Payer: Commercial Managed Care - HMO | Admitting: Occupational Therapy

## 2016-07-10 ENCOUNTER — Telehealth: Payer: Self-pay | Admitting: Neurology

## 2016-07-10 ENCOUNTER — Ambulatory Visit: Payer: Commercial Managed Care - HMO | Admitting: Physical Therapy

## 2016-07-10 DIAGNOSIS — R2689 Other abnormalities of gait and mobility: Secondary | ICD-10-CM

## 2016-07-10 DIAGNOSIS — M6281 Muscle weakness (generalized): Secondary | ICD-10-CM

## 2016-07-10 DIAGNOSIS — R278 Other lack of coordination: Secondary | ICD-10-CM

## 2016-07-10 DIAGNOSIS — R41841 Cognitive communication deficit: Secondary | ICD-10-CM

## 2016-07-10 DIAGNOSIS — I69315 Cognitive social or emotional deficit following cerebral infarction: Secondary | ICD-10-CM

## 2016-07-10 DIAGNOSIS — R2681 Unsteadiness on feet: Secondary | ICD-10-CM

## 2016-07-10 NOTE — Therapy (Cosign Needed)
[facility] [address] [place], [place], [address] Phone: [phone]   Fax:  [phone]  Physical Therapy Treatment  Patient Details  Name: Annette Munoz MRN: [id number] Date of Birth: [birth year] Referring Provider: Marvel Plan  Encounter Date: 07/07/2016   07/07/16 1426  PT Visits / Re-Eval  Visit Number 3  Number of Visits 19  Date for PT Re-Evaluation 08/25/16  Authorization  Authorization Type [employer] 60 visit limit combined  PT Time Calculation  PT Start Time 1102  PT Stop Time 1148  PT Time Calculation (min) 46 min  PT - End of Session  Equipment Utilized During Treatment Gait belt  Activity Tolerance Patient tolerated treatment well  Behavior During Therapy Impulsive (R sided neglect)     Past Medical History:  Diagnosis Date  . Diabetes mellitus without complication (HCC)   . Hypertension   . Stroke [facility])     Past Surgical History:  Procedure Laterality Date  . CESAREAN SECTION    . TEE WITHOUT CARDIOVERSION N/A 08/10/2015   Procedure: TRANSESOPHAGEAL ECHOCARDIOGRAM (TEE);  Surgeon: Chrystie Nose, MD;  Location: [name] ENDOSCOPY;  Service: Cardiovascular;  Laterality: N/A;    There were no vitals filed for this visit.   07/07/16 1109  Symptoms/Limitations  Subjective spouse reports pt had another fall this week while taking a shower by her self. He found her lying in bathroom floor. "She was suppossed to wait till I got back home". Spouse helped her up, not aware if pt hit her head again or not.  Pt does not recall fall and states she is not in pain.   Patient is accompained by: Family member  Pertinent History HTN, DM, CVA (x 2), with recent TIA per family report; weakness on R side new since 06/18/16  Patient Stated Goals Pt's goals for therapy are to "do everything I need to do."  Pain Assessment  Currently in Pain? No/denies  Pain Score 0      07/07/16 1118  Transfers  Transfers Sit to  Stand;Stand to Sit  Sit to Stand 4: Min assist;With upper extremity assist;With armrests;From chair/3-in-1;Uncontrolled descent  Sit to Stand Details Tactile cues for sequencing;Tactile cues for placement;Tactile cues for weight beaing;Verbal cues for technique;Verbal cues for sequencing  Stand to Sit 4: Min assist;With upper extremity assist;To chair/3-in-1;Uncontrolled descent  Stand to Sit Details (indicate cue type and reason) Tactile cues for sequencing;Tactile cues for placement;Verbal cues for sequencing;Verbal cues for technique  Ambulation/Gait  Ambulation/Gait Yes  Ambulation/Gait Assistance 4: Min assist  Ambulation/Gait Assistance Details trialed multiple brace combo's along with orthotist present. pt needs cues to attend to walker, for posture and step placement  Ambulation Distance (Feet) 115 Feet (x 3 reps)  Assistive device Rolling walker  Gait Pattern Step-to pattern;Step-through pattern;Decreased step length - right;Decreased stance time - right;Decreased dorsiflexion - right;Decreased weight shift to right;Right foot flat;Right genu recurvatum;Poor foot clearance - right  Ambulation Surface Level;Indoor      07/07/16 1630  PT Education  Education provided Yes  Education Details fall prevention strategies, pt's need for 24 hour direct supervision at this time due to decreased memory/safety awareness  Person(s) Educated Patient;Spouse  Methods Explanation;Demonstration;Handout  Comprehension Verbalized understanding;Returned demonstration;Need further instruction           PT Short Term Goals - 06/26/16 1252      PT SHORT TERM GOAL #1   Title Pt/family will verbalize/demo understanding of HEP for strength, balance, transfers, and gait.  TARGET 07/25/16  Time 4   Period Weeks   Status New     PT SHORT TERM GOAL #2   Title Pt will perform sit<>stand transfers with correct sequence, with supervision, 8 of 10 trials, for improved safety and efficiency with  transfers.   Time 4   Period Weeks   Status New     PT SHORT TERM GOAL #3   Title Pt will improve TUG score to less than or equal to 28 seconds for decreased fall risk.   Time 4   Period Weeks   Status New     PT SHORT TERM GOAL #4   Title Pt will negotiate at least 4 steps using 1 handrail, with min asistance, for improved safety with home stair negotiation.   Time 4   Period Weeks   Status New     PT SHORT TERM GOAL #5   Title Pt will improve Berg score by at least 5 points (>11/56) for decreased fall risk.   Time 4   Period Weeks   Status New           PT Long Term Goals - 06/26/16 1256      PT LONG TERM GOAL #1   Title Pt/family will verbalize understanding of fall prevention within the home environment.  TARGET 08/25/16   Time 8   Period Weeks   Status New     PT LONG TERM GOAL #2   Title Pt will improve gait velocity to at least 1.3 ft/sec for improved household gait.   Time 8   Period Weeks   Status New     PT LONG TERM GOAL #3   Title Pt will improve TUG score to less than or equal to 20 seconds for decreased fall risk.   Time 8   Period Weeks     PT LONG TERM GOAL #4   Title Pt will improve Berg Balance score to at least 21/56 for decreased fall risk.   Time 8   Period Weeks   Status New     PT LONG TERM GOAL #5   Title Pt will ambulate at least 500 ft, indoor/outdoor surfaces with supervision, for improved household and community ambulation.   Time 8   Period Weeks   Status New     Additional Long Term Goals   Additional Long Term Goals Yes     PT LONG TERM GOAL #6   Title Pt will negotiate at least 4 steps, 1 rail, with supervision of family, for improved safety with stair negotiation into/out of the home.   Time 8   Period Weeks   Status New        07/07/16 1426  Plan  Clinical Impression Statement During today's session Thayer Ohm from [facility] was present for orthotic consult. Multiple braces were trialed with overall outcome being to go  with a blue rocker and leather toe cap. Pt may also need a lift with the brace to assist with recurvatum control. Remainder of session addressed fall prevention with pt and spouse. Pt's spouse reports that now when ever he has to leave the pt alone (such as to take son to school) the pt's daughter calls and keeps her talking on phone the entire time. Handout given with other fall prevention strategies. Reinforced the best fall prevention is that the pt have 24 hour supervision (eyes on pt at all times) as she is impulsive at times and lacks the memory to not get up alone/remember to get her walker.  Pt will benefit from skilled therapeutic intervention in order to improve on the following deficits Abnormal gait;Decreased balance;Decreased cognition;Decreased mobility;Decreased knowledge of use of DME;Decreased safety awareness;Decreased strength;Difficulty walking;Postural dysfunction;Impaired tone  Rehab Potential Fair  Clinical Impairments Affecting Rehab Potential Cognition (pt does have family support of husband and daughter)  PT Frequency 2x / week  PT Duration Other (comment) (9 weeks; plus eval)  PT Treatment/Interventions ADLs/Self Care Home Management;Functional mobility training;Stair training;Gait training;DME Instruction;Therapeutic activities;Therapeutic exercise;Balance training;Neuromuscular re-education;Patient/family education;Orthotic Fit/Training  PT Next Visit Plan lower extremity strengthening, transfers, gait; orthotist consult at some future visit to be established.  Consulted and Agree with Plan of Care Patient;Family member/caregiver  Family Member Consulted husband and daughter          Patient will benefit from skilled therapeutic intervention in order to improve the following deficits and impairments:  Abnormal gait, Decreased balance, Decreased cognition, Decreased mobility, Decreased knowledge of use of DME, Decreased safety awareness,  Decreased strength, Difficulty walking, Postural dysfunction, Impaired tone  Visit Diagnosis: Other abnormalities of gait and mobility  Unsteadiness on feet  Muscle weakness (generalized)     Problem List Patient Active Problem List   Diagnosis Date Noted  . Seizure-like activity (HCC) 06/28/2016  . Cerebral infarction due to unspecified mechanism   . Gait difficulty   . Memory loss 05/22/2016  . Confusion   . Acute ischemic stroke (HCC) 05/18/2016  . Morbid obesity (HCC) 09/30/2015  . Uncontrolled type 2 diabetes mellitus with circulatory disorder, without long-term current use of insulin (HCC)   . Essential hypertension   . Cerebrovascular accident (CVA) due to thrombosis of cerebral artery (HCC)   . CVA (cerebral infarction) 08/07/2015  . Pure hypercholesterolemia 10/04/2009    Sallyanne KusterKathy Sariah Henkin, PTA, [facility] [address], [address] [place], [name] [address] [phone] 07/10/16, 6:46 AM   Name: Annette Munoz MRN: [id number] Date of Birth: 02/18/1966

## 2016-07-10 NOTE — Telephone Encounter (Signed)
I called Grady back. He states that he does not want patient to have the EEG completed. He reports that patient does not drive. Mosetta PuttGrady feels that PCP misinterpreted what had happened to the patient. He said that Kathie RhodesBetty was given too much Metformin and blood pressure medication. She was sick from it going to the bathroom, and had an episode where her tongue was hanging out. He states that patient did not have a seizure. Mosetta PuttGrady advised me that "I will make the call, she is not having that test. And if anyone has a question, they can call me". I advised him that I will let Dr. Roda ShuttersXu know.

## 2016-07-10 NOTE — Therapy (Signed)
[facility] [address] [place], [name], [address] Phone: [phone]   [facility]:  [phone]  Speech Language Pathology Treatment  Patient Details  Name: Annette Munoz MRN: [id number] Date of Birth: 04/29/1966 Referring Provider: Dr. Renaye RakersVeita Bland  Encounter Date: 07/10/2016      End of Session - 07/10/16 1042    Visit Number 3   Number of Visits 17   Date for SLP Re-Evaluation 09/08/16   SLP Start Time 0845   SLP Stop Time  0930   SLP Time Calculation (min) 45 min   Activity Tolerance Patient tolerated treatment well      Past Medical History:  Diagnosis Date  . Diabetes mellitus without complication (HCC)   . Hypertension   . Stroke [facility](HCC)     Past Surgical History:  Procedure Laterality Date  . CESAREAN SECTION    . TEE WITHOUT CARDIOVERSION N/A 08/10/2015   Procedure: TRANSESOPHAGEAL ECHOCARDIOGRAM (TEE);  Surgeon: Chrystie NoseKenneth C Hilty, MD;  [facility];  Service: Cardiovascular;  Laterality: N/A;    There were no vitals filed for this visit.      Subjective Assessment - 07/10/16 1034    Subjective "I think I'm doing alright."   Patient is accompained by: Family member  daughter   Currently in Pain? No/denies               ADULT SLP TREATMENT - 07/10/16 0001      General Information   Behavior/Cognition Alert;Cooperative;Pleasant mood     Treatment Provided   Treatment provided Cognitive-Linquistic     Pain Assessment   Pain Assessment No/denies pain     Cognitive-Linquistic Treatment   Treatment focused on Cognition   Skilled Treatment Pt able to use calendar with mod verbal cueing this date, but was independently oriented to Month and day. Pt was continually surprised throughout the session by the quality of her handwriting, but with min A was able to tell me that her handwriting was impacted by her stroke. Mod verbal cueing to maintain attention to task throughout session.  Emerging awareness noted in regard to repetition of information secondary to memory impairment. Home work provided.      Assessment / Recommendations / Plan   Plan Continue with current plan of care     Progression Toward Goals   Progression toward goals Progressing toward goals            SLP Short Term Goals - 07/10/16 1044      SLP SHORT TERM GOAL #2   Title Pt/family will report use of external aids/memory book at home for ADL's and medication mangement over 3 sessions   Time 4   Period Weeks   Status On-going     SLP SHORT TERM GOAL #3   Title Pt will verbalize 3 cognitive impairments with mod A   Time 4   Period Weeks   Status On-going     SLP SHORT TERM GOAL #4   Title Pt will attend to simple cognitive linguistic tasks in distracting enviroment for 10 mintues with occasional min A   Time 4   Period Weeks   Status On-going          SLP Long Term Goals - 07/10/16 1045      SLP LONG TERM GOAL #1   Title Pt/family will report use of external memory aids at home for successful f/u on daily schedule, safety precautions, chores, meds, etc over 5 sessions.   Time  8   Period Weeks   Status On-going     SLP LONG TERM GOAL #2   Title Pt will verbalize 3 safety precautions due to cognitive impairments with mod A.    Time 8   Period Weeks   Status On-going     SLP LONG TERM GOAL #3   Title Pt will solve simple reasoning, organization, time and money problems with 75% accuracy and occasional min A   Time 8   Period Weeks   Status On-going     SLP LONG TERM GOAL #4   Title Pt will demonstrate emergent awareness by using memory compensation during therapy session x2 sessions   Time 2   Period Weeks   Status On-going          Plan - 07/10/16 1043    Clinical Impression Statement Severe cognitive impairment persists, but with emerging improvement in awareness and recall.    Speech Therapy Frequency 2x / week   Treatment/Interventions Compensatory  strategies;Patient/family education;Functional tasks;Cueing hierarchy;Internal/external aids;Cognitive reorganization;SLP instruction and feedback   Potential to Achieve Goals Fair   Potential Considerations Severity of impairments;Previous level of function      Patient will benefit from skilled therapeutic intervention in order to improve the following deficits and impairments:   Cognitive communication deficit    Problem List Patient Active Problem List   Diagnosis Date Noted  . Seizure-like activity (HCC) 06/28/2016  . Cerebral infarction due to unspecified mechanism   . Gait difficulty   . Memory loss 05/22/2016  . Confusion   . Acute ischemic stroke (HCC) 05/18/2016  . Morbid obesity (HCC) 09/30/2015  . Uncontrolled type 2 diabetes mellitus with circulatory disorder, without long-term current use of insulin (HCC)   . Essential hypertension   . Cerebrovascular accident (CVA) due to thrombosis of cerebral artery (HCC)   . CVA (cerebral infarction) 08/07/2015  . Pure hypercholesterolemia 10/04/2009    Rocky Crafts MA, CCC-SLP 07/10/2016, 10:48 AM   [facility] [address] [place], [place], [address] Phone: [phone]   Fax:  [phone]   Name: Annette Munoz MRN: [id number] Date of Birth: [birth year]

## 2016-07-10 NOTE — Telephone Encounter (Signed)
Spouse Mosetta PuttGrady called wanting to know who ordered EEG that is scheduled for September 27th and why it was ordered. Please call to advise.

## 2016-07-10 NOTE — Patient Instructions (Addendum)
  ROM: Abduction - Wand   SITTING, Holding wand with left hand palm up, push wand directly out to side, leading with other hand palm down, until stretch is felt. Hold 3 seconds. Repeat 10 times per set. Do 2-3 sessions per day.     Press-Up With Wand   START BY HOLDING STICK IN BOTH HANDS AT YOUR CHEST.  Press wand OUT until elbows are straight, then BRING IT BACK TO YOUR CHEST. Hold 3 seconds. Repeat 10 times. Do 2 sessions per day.     A  Cane Overhead - Standing   SITTING, With arms straight, hold cane forward at waist. Raise cane above head. Hold 3 seconds. Repeat 10 times. Do 2 times per day.  Copyright  VHI. All rights reserved.   http://ecce.exer.us/152        Coordination Activities  Perform the following activities for  1 times per day with right hand(s).   Flip cards 1 at a time.  Pick up coins, buttons, marbles, dried beans/pasta of different sizes and place in container.  Pick up coins and place in coin bank.  Pick up checkers and stack.  Practice writing you name, address, copy simple sentence.

## 2016-07-10 NOTE — Therapy (Addendum)
[facility] [address] [place], [place], [address] Phone: [phone]   Fax:  [phone]  Occupational Therapy Treatment  Patient Details  Name: Annette Munoz MRN: [id number] Date of Birth: 1965/10/20 Referring Provider: Zenia Resides. Thedore Mins  Encounter Date: 07/10/2016      OT End of Session - 07/10/16 0814    Visit Number 3   Number of Visits 16   Authorization Type [employer] 60 Visit Limit Combined   Authorization - Visit Number 3   Authorization - Number of Visits 16   OT Start Time 0808   OT Stop Time 0847   OT Time Calculation (min) 39 min   Activity Tolerance Patient tolerated treatment well   Behavior During Therapy Flat affect    **Pt arrived late    Past Medical History:  Diagnosis Date  . Diabetes mellitus without complication (HCC)   . Hypertension   . Stroke [facility])     Past Surgical History:  Procedure Laterality Date  . CESAREAN SECTION    . TEE WITHOUT CARDIOVERSION N/A 08/10/2015   Procedure: TRANSESOPHAGEAL ECHOCARDIOGRAM (TEE);  Surgeon: Chrystie Nose, MD;  Location: [facility] ENDOSCOPY;  Service: Cardiovascular;  Laterality: N/A;    There were no vitals filed for this visit.      Subjective Assessment - 07/10/16 0812    Subjective  "It's easier to do with my L hand"   Patient is accompained by: Family member  daughter   Currently in Pain? No/denies                              OT Education - 07/10/16 0856    Education Details Cane HEP (x2 sets of 10 each), simple coordination HEP--see pt instructions   Person(s) Educated Patient;Child(ren)   Methods Explanation;Demonstration;Verbal cues;Handout   Comprehension Verbal cues required;Returned demonstration;Verbalized understanding;Need further instruction          OT Short Term Goals - 07/07/16 1308      OT SHORT TERM GOAL #1   Title Pt will be Mod I signs and symtpoms of  CVA/TIA   Status Deferred     OT SHORT TERM GOAL #2   Title Pt will be Mod I home program RUE  supervision and mod cueing   Status Revised     OT SHORT TERM GOAL #3   Title Pt/family will I state 2-3 pieces of a/e &/or DME for home use for increased independnece and safety   Status On-going     OT SHORT TERM GOAL #4   Title Patient will dress lower body with mod cueing and intermittent mod assistance following set up.   Time 4   Period Weeks   Status New     OT SHORT TERM GOAL #5   Title Patient will dress upper body with set up and minimal assistance   Time 4   Period Weeks   Status New           OT Long Term Goals - 06/27/16 1221      OT LONG TERM GOAL #1   Title Pt will be Min A LB/UB Dressing and ADL's   Baseline Mod-max A   Time 8   Period Weeks   Status New     OT LONG TERM GOAL #2   Title Pt will be Mod I-supervision updated HEP RUE   Time 8   Period Weeks  Status New     OT LONG TERM GOAL #3   Title Pt will demonstrate improved RUE strength as seen by 3+/5 MMT   Baseline -3/5 RUE   Time 8   Period Weeks   Status New     OT LONG TERM GOAL #4   Title Pt will demonstrate improved R hand coordination and grip strength as seen by improved Box & Blocks to 30 or greater and grip 30# or greater R.   Baseline Grip 19#; Box and Blocks = 13 (06/27/16)   Time 8   Period Weeks   Status New     OT LONG TERM GOAL #5   Title Pt will demonstrate improved functional mobility and balance as seen by supervision assist for simulated shower transfers   Baseline Moderate (06/27/16)   Time 8   Period Weeks   Status New               Plan - 07/10/16 0836    Clinical Impression Statement Pt demo incr use of R hand, but needs cueing for attention to task and demo R inattention.   Rehab Potential Fair   Clinical Impairments Affecting Rehab Potential severity of deficits; cognitive deficits   OT Frequency 2x / week   OT Duration 8 weeks   OT  Treatment/Interventions Self-care/ADL training;Therapeutic exercise;DME and/or AE instruction;Therapeutic activities;Patient/family education;Balance training;Neuromuscular education;Energy conservation;Passive range of motion;Therapeutic exercises;Visual/perceptual remediation/compensation   Plan ADL activity, coordination, attention (enjoys simple board games per husband)   OT Home Exercise Plan 07/10/16:  cane and simple coordination HEP   Consulted and Agree with Plan of Care Patient;Family member/caregiver   Family Member Consulted Husband Mosetta PuttGrady, daughter      Patient will benefit from skilled therapeutic intervention in order to improve the following deficits and impairments:  Decreased balance, Decreased endurance, Impaired vision/preception, Decreased range of motion, Decreased knowledge of precautions, Decreased cognition, Decreased activity tolerance, Decreased coordination, Decreased knowledge of use of DME, Decreased safety awareness, Decreased strength, Impaired flexibility, Impaired UE functional use, Decreased mobility, Difficulty walking  Visit Diagnosis: Muscle weakness (generalized)  Other lack of coordination  Cognitive social or emotional deficit following cerebral infarction  Other abnormalities of gait and mobility    Problem List Patient Active Problem List   Diagnosis Date Noted  . Seizure-like activity (HCC) 06/28/2016  . Cerebral infarction due to unspecified mechanism   . Gait difficulty   . Memory loss 05/22/2016  . Confusion   . Acute ischemic stroke (HCC) 05/18/2016  . Morbid obesity (HCC) 09/30/2015  . Uncontrolled type 2 diabetes mellitus with circulatory disorder, without long-term current use of insulin (HCC)   . Essential hypertension   . Cerebrovascular accident (CVA) due to thrombosis of cerebral artery (HCC)   . CVA (cerebral infarction) 08/07/2015  . Pure hypercholesterolemia 10/04/2009    [facility] WestFREEMAN,Efraim Vanallen 07/10/2016, 8:58 AM  [facility] [address] [place], [name], [address] Phone: [phone]   Fax:  [phone]  Name: Annette Munoz MRN: [id number] Date of Birth: 04/15/1966   Willa FraterAngela Hanne Kegg, OTR/L [facility] [address] [place], [name]  [address] [phone] phone [phone] 07/10/16 8:58 AM

## 2016-07-10 NOTE — Therapy (Signed)
[facility] [address] [place], [place], [address] Phone: [phone]   Fax:  [phone]  Physical Therapy Treatment  Patient Details  Name: Annette Munoz MRN: [id number] Date of Birth: September 05, 1966 Referring Provider: Marvel Plan  Encounter Date: 07/10/2016      PT End of Session - 07/10/16 1010    Visit Number (P)  4   Number of Visits (P)  19   Date for PT Re-Evaluation (P)  08/25/16   Authorization Type (P)  [employer] 60 visit limit combined   PT Start Time (P)  0935   PT Stop Time (P)  1015   PT Time Calculation (min) (P)  40 min   Behavior During Therapy (P)  Flat affect      Past Medical History:  Diagnosis Date  . Diabetes mellitus without complication (HCC)   . Hypertension   . Stroke [facility])     Past Surgical History:  Procedure Laterality Date  . CESAREAN SECTION    . TEE WITHOUT CARDIOVERSION N/A 08/10/2015   Procedure: TRANSESOPHAGEAL ECHOCARDIOGRAM (TEE);  Surgeon: Chrystie Nose, MD;  Location: [facility] ENDOSCOPY;  Service: Cardiovascular;  Laterality: N/A;    There were no vitals filed for this visit.      Subjective Assessment - 07/10/16 0937    Subjective No falls since last visit. Pt's daughter states that she helps pt do HEP 1-2x/day.   Currently in Pain? No/denies                         [facility] Adult PT Treatment/Exercise - 07/10/16 0001      Ambulation/Gait   Ambulation/Gait Yes   Ambulation/Gait Assistance 4: Min assist   Ambulation/Gait Assistance Details Working on activity tolerance and awareness on Left foot placement   cues for left heel strike and toe off.   Ambulation Distance (Feet) 115 Feet  x2   Assistive device Rolling walker   Gait Pattern Step-to pattern;Step-through pattern;Decreased step length - right;Decreased stance time - right;Decreased dorsiflexion - right;Decreased weight shift to right;Right foot flat;Right genu recurvatum;Poor foot clearance -  right   Ambulation Surface Level;Indoor     Knee/Hip Exercises: Standing   Heel Raises Both;10 reps     Knee/Hip Exercises: Supine   Bridges Both;10 reps   Straight Leg Raises Strengthening;Right;10 reps;2 sets   Other Supine Knee/Hip Exercises right hip internal rotation (straight leg)     Ankle Exercises: Seated   Other Seated Ankle Exercises Ankle dorsiflexion 10x2 in hooklying position             Balance Exercises - 07/10/16 1300      Balance Exercises: Standing   Sit to Stand Time x5 without UE support working on increased right LE involvment using stagger stance    Other Standing Exercises upper trunk rotations without UE support with supervision.           PT Education - 07/10/16 1305    Education provided Yes   Education Details Standing LE strengthening          PT Short Term Goals - 06/26/16 1252      PT SHORT TERM GOAL #1   Title Pt/family will verbalize/demo understanding of HEP for strength, balance, transfers, and gait.  TARGET 07/25/16   Time 4   Period Weeks   Status New     PT SHORT TERM GOAL #2   Title Pt will perform sit<>stand transfers with correct sequence, with  supervision, 8 of 10 trials, for improved safety and efficiency with transfers.   Time 4   Period Weeks   Status New     PT SHORT TERM GOAL #3   Title Pt will improve TUG score to less than or equal to 28 seconds for decreased fall risk.   Time 4   Period Weeks   Status New     PT SHORT TERM GOAL #4   Title Pt will negotiate at least 4 steps using 1 handrail, with min asistance, for improved safety with home stair negotiation.   Time 4   Period Weeks   Status New     PT SHORT TERM GOAL #5   Title Pt will improve Berg score by at least 5 points (>11/56) for decreased fall risk.   Time 4   Period Weeks   Status New           PT Long Term Goals - 06/26/16 1256      PT LONG TERM GOAL #1   Title Pt/family will verbalize understanding of fall prevention within  the home environment.  TARGET 08/25/16   Time 8   Period Weeks   Status New     PT LONG TERM GOAL #2   Title Pt will improve gait velocity to at least 1.3 ft/sec for improved household gait.   Time 8   Period Weeks   Status New     PT LONG TERM GOAL #3   Title Pt will improve TUG score to less than or equal to 20 seconds for decreased fall risk.   Time 8   Period Weeks     PT LONG TERM GOAL #4   Title Pt will improve Berg Balance score to at least 21/56 for decreased fall risk.   Time 8   Period Weeks   Status New     PT LONG TERM GOAL #5   Title Pt will ambulate at least 500 ft, indoor/outdoor surfaces with supervision, for improved household and community ambulation.   Time 8   Period Weeks   Status New     Additional Long Term Goals   Additional Long Term Goals Yes     PT LONG TERM GOAL #6   Title Pt will negotiate at least 4 steps, 1 rail, with supervision of family, for improved safety with stair negotiation into/out of the home.   Time 8   Period Weeks   Status New               Plan - 07/10/16 1307    Clinical Impression Statement Pt is making gradual progress with activity tolerance during gait increasing distance today although pt needed to have verbal encouragement with gait and standing activities reporting fatigue.   Rehab Potential Fair   Clinical Impairments Affecting Rehab Potential Cognition (pt does have family support of husband and daughter)   PT Frequency 2x / week   PT Duration Other (comment)  9 weeks; plus eval   PT Treatment/Interventions ADLs/Self Care Home Management;Functional mobility training;Stair training;Gait training;DME Instruction;Therapeutic activities;Therapeutic exercise;Balance training;Neuromuscular re-education;Patient/family education;Orthotic Fit/Training   PT Next Visit Plan lower extremity strengthening, transfers, gait; orthotist plans to bring R AFO next week.   Consulted and Agree with Plan of Care Patient;Family  member/caregiver   Family Member Consulted husband and daughter      Patient will benefit from skilled therapeutic intervention in order to improve the following deficits and impairments:  Abnormal gait, Decreased balance, Decreased cognition, Decreased mobility,  Decreased knowledge of use of DME, Decreased safety awareness, Decreased strength, Difficulty walking, Postural dysfunction, Impaired tone, Increased fascial restricitons  Visit Diagnosis: Unsteadiness on feet  Muscle weakness (generalized)  Other abnormalities of gait and mobility     Problem List Patient Active Problem List   Diagnosis Date Noted  . Seizure-like activity (HCC) 06/28/2016  . Cerebral infarction due to unspecified mechanism   . Gait difficulty   . Memory loss 05/22/2016  . Confusion   . Acute ischemic stroke (HCC) 05/18/2016  . Morbid obesity (HCC) 09/30/2015  . Uncontrolled type 2 diabetes mellitus with circulatory disorder, without long-term current use of insulin (HCC)   . Essential hypertension   . Cerebrovascular accident (CVA) due to thrombosis of cerebral artery (HCC)   . CVA (cerebral infarction) 08/07/2015  . Pure hypercholesterolemia 10/04/2009    Hortencia Conradi, PTA  07/10/16, 2:26 PM  [place] [facility] [address] [place], [place], [address] Phone: [phone]   Fax:  [phone]  Name: ANATALIA KRONK MRN: [id number] Date of Birth: 31-Jul-1966

## 2016-07-11 ENCOUNTER — Encounter: Payer: Self-pay | Admitting: Physical Therapy

## 2016-07-11 ENCOUNTER — Ambulatory Visit: Payer: Commercial Managed Care - HMO | Admitting: Physical Therapy

## 2016-07-11 ENCOUNTER — Ambulatory Visit: Payer: Commercial Managed Care - HMO | Admitting: Speech Pathology

## 2016-07-11 ENCOUNTER — Ambulatory Visit: Payer: Commercial Managed Care - HMO | Admitting: Occupational Therapy

## 2016-07-11 DIAGNOSIS — M6281 Muscle weakness (generalized): Secondary | ICD-10-CM

## 2016-07-11 DIAGNOSIS — I69315 Cognitive social or emotional deficit following cerebral infarction: Secondary | ICD-10-CM

## 2016-07-11 DIAGNOSIS — R2681 Unsteadiness on feet: Secondary | ICD-10-CM

## 2016-07-11 DIAGNOSIS — R2689 Other abnormalities of gait and mobility: Secondary | ICD-10-CM | POA: Diagnosis not present

## 2016-07-11 DIAGNOSIS — R278 Other lack of coordination: Secondary | ICD-10-CM

## 2016-07-11 DIAGNOSIS — R41841 Cognitive communication deficit: Secondary | ICD-10-CM

## 2016-07-11 NOTE — Patient Instructions (Addendum)
Internal / External Rotation    With pillow between legs, gently turn legs and feet in and out. Repeat __10__ times. Do _1-2___ sessions per day.  http://[email or web address].exer.us/376   Copyright  VHI. All rights reserved.

## 2016-07-11 NOTE — Telephone Encounter (Signed)
Noted. Please cancel the EEG for now. Thanks.   Marvel PlanJindong Jaquin Coy, MD PhD Stroke Neurology 07/11/2016 4:01 PM

## 2016-07-11 NOTE — Patient Instructions (Signed)
  You have had 3 strokes  These strokes have affected your memory and caused weakness in your hand and walking  You need to use a walker  You are not working right now because of your weakness   You need to get help to shower and get dressed  You have therapy exercises do to each day to help you get better  You can do games to help your memory and focus

## 2016-07-11 NOTE — Therapy (Signed)
[facility] [address] [place], [name], [address] Phone: [phone]   Fax:  [phone]  Speech Language Pathology Treatment  Patient Details  Name: Annette SanerBetty H Munoz MRN: [id number] Date of Birth: 10/16/1966 Referring Provider: Dr. Renaye RakersVeita Bland  Encounter Date: 07/11/2016      End of Session - 07/11/16 1218    Visit Number 4   Number of Visits 17   Date for SLP Re-Evaluation 09/08/16   SLP Start Time 0934   SLP Stop Time  1015   SLP Time Calculation (min) 41 min      Past Medical History:  Diagnosis Date  . Diabetes mellitus without complication (HCC)   . Hypertension   . Stroke [facility](HCC)     Past Surgical History:  Procedure Laterality Date  . CESAREAN SECTION    . TEE WITHOUT CARDIOVERSION N/A 08/10/2015   Procedure: TRANSESOPHAGEAL ECHOCARDIOGRAM (TEE);  Surgeon: Chrystie NoseKenneth C Hilty, MD;  Location: [facility] ENDOSCOPY;  Service: Cardiovascular;  Laterality: N/A;    There were no vitals filed for this visit.      Subjective Assessment - 07/11/16 1208    Subjective "She's noticing she can't do some things now" daughter   Patient is accompained by: Family member   Special Tests daughter               ADULT SLP TREATMENT - 07/11/16 1209      General Information   Behavior/Cognition Alert;Cooperative;Pleasant mood     Treatment Provided   Treatment provided Cognitive-Linquistic     Pain Assessment   Pain Assessment No/denies pain     Cognitive-Linquistic Treatment   Treatment focused on Cognition   Skilled Treatment Trained daughter and pt in use of memory notebook, organized notebook while instructing pt and daughter on how to use notebook to facilitate memory and awareness of her deficits. Daughter verbalized understanding. Pt utilized calendar with mod cues to cross off passed dates and to locate today's date and schedule. Selective attention with occasional mod A, as pt would state she finished the  task while page remained incomplete.      Assessment / Recommendations / Plan   Plan Continue with current plan of care     Progression Toward Goals   Progression toward goals Progressing toward goals          SLP Education - 07/11/16 1212    Education provided Yes   Education Details Use signs on wall/mirror as needed to remind pt of safey precautions    Person(s) Educated Child(ren)   Methods Explanation;Demonstration;Verbal cues   Comprehension Verbalized understanding          SLP Short Term Goals - 07/11/16 1217      SLP SHORT TERM GOAL #1   Title Pt will utilize external aids/memory book to be oriented to date, appointments and daily chores with occasional min A over 3 sessions   Time 3   Period Weeks   Status On-going     SLP SHORT TERM GOAL #2   Title Pt/family will report use of external aids/memory book at home for ADL's and medication mangement over 3 sessions   Time 3   Period Weeks   Status On-going     SLP SHORT TERM GOAL #3   Title Pt will verbalize 3 cognitive impairments with mod A   Time 3   Period Weeks     SLP SHORT TERM GOAL #4   Title Pt will attend to simple cognitive linguistic tasks  in distracting enviroment for 10 mintues with occasional min A   Time 3   Period Weeks   Status On-going          SLP Long Term Goals - 07/11/16 1216      SLP LONG TERM GOAL #1   Title Pt/family will report use of external memory aids at home for successful f/u on daily schedule, safety precautions, chores, meds, etc over 5 sessions.   Time 7   Period Weeks   Status On-going     SLP LONG TERM GOAL #2   Title Pt will verbalize 3 safety precautions due to cognitive impairments with mod A.    Time 7   Period Weeks   Status On-going     SLP LONG TERM GOAL #3   Title Pt will solve simple reasoning, organization, time and money problems with 75% accuracy and occasional min A   Time 7   Period Weeks   Status On-going     SLP LONG TERM GOAL #4    Title Pt will demonstrate emergent awareness by using memory compensation during therapy session x2 sessions   Time 2   Period Weeks   Status On-going          Plan - 07/11/16 1213    Clinical Impression Statement Daughter reports mild improvment of awareness of difficulty doing tasks. Pt did state "they say I had a stroke" when asked why she is having this difficulty. This is the 1st time she recalled/verbalized the stroke without cues. Selective attention with mod A, memory/recall remains severely impaired with usual mod to max A to use memory compesations. Continue skilled ST to maximize cognition to reduce caregivers burden.   Speech Therapy Frequency 2x / week   Treatment/Interventions Compensatory strategies;Patient/family education;Functional tasks;Cueing hierarchy;Internal/external aids;Cognitive reorganization;SLP instruction and feedback   Potential to Achieve Goals Fair   Potential Considerations Severity of impairments;Previous level of function   Consulted and Agree with Plan of Care Patient;Family member/caregiver   Family Member Consulted daughter      Patient will benefit from skilled therapeutic intervention in order to improve the following deficits and impairments:   Cognitive communication deficit    Problem List Patient Active Problem List   Diagnosis Date Noted  . Seizure-like activity (HCC) 06/28/2016  . Cerebral infarction due to unspecified mechanism   . Gait difficulty   . Memory loss 05/22/2016  . Confusion   . Acute ischemic stroke (HCC) 05/18/2016  . Morbid obesity (HCC) 09/30/2015  . Uncontrolled type 2 diabetes mellitus with circulatory disorder, without long-term current use of insulin (HCC)   . Essential hypertension   . Cerebrovascular accident (CVA) due to thrombosis of cerebral artery (HCC)   . CVA (cerebral infarction) 08/07/2015  . Pure hypercholesterolemia 10/04/2009    Annette Munoz, [name] 07/11/2016, 12:19 PM  [name] [address] [place], [place], [address] Phone: [phone]   Fax:  [phone]   Name: LERIN JECH MRN: [id number] Date of Birth: 12/29/1965

## 2016-07-11 NOTE — Therapy (Signed)
[facility] [address] [place], [place], [address] Phone: [phone]   Fax:  [phone]  Occupational Therapy Treatment  Patient Details  Name: Annette Munoz MRN: [id number] Date of Birth: October 13, 1966 Referring Provider: Zenia Resides. Thedore Mins  Encounter Date: 07/11/2016      OT End of Session - 07/11/16 0913    Visit Number 4   Number of Visits 16   Date for OT Re-Evaluation 08/22/16   Authorization Type [employer] 60 Visit Limit Combined   Authorization - Visit Number 4   Authorization - Number of Visits 16   OT Start Time [phone]   OT Stop Time 0930   OT Time Calculation (min) 38 min   Activity Tolerance Patient tolerated treatment well   Behavior During Therapy Flat affect      Past Medical History:  Diagnosis Date  . Diabetes mellitus without complication (HCC)   . Hypertension   . Stroke [facility])     Past Surgical History:  Procedure Laterality Date  . CESAREAN SECTION    . TEE WITHOUT CARDIOVERSION N/A 08/10/2015   Procedure: TRANSESOPHAGEAL ECHOCARDIOGRAM (TEE);  Surgeon: Chrystie Nose, MD;  Location: [facility] ENDOSCOPY;  Service: Cardiovascular;  Laterality: N/A;    There were no vitals filed for this visit.      Subjective Assessment - 07/11/16 0856    Patient is accompained by: Family member   Currently in Pain? No/denies             Handwriting activity tracing alphabet with highlighter, min-mod difficulty Education with pt/ daughter regarding importance of avoiding shoulder hike with RUE use and safety for sit-stand and attending to right side. Pt's daughter verbalizes understanding. Pt is limited by cognitive deficits.                 OT Education - 07/11/16 [phone]    Education provided Yes   Education Details Review of inital coordination HEP and cane exercises, mod v.c. to slow down and for positioning to avoid compensations   Person(s)  Educated Patient;Child(ren)   Methods Explanation;Demonstration;Verbal cues   Comprehension Verbalized understanding;Returned demonstration;Verbal cues required;Tactile cues required          OT Short Term Goals - 07/07/16 1308      OT SHORT TERM GOAL #1   Title Pt will be Mod I signs and symtpoms of CVA/TIA   Status Deferred     OT SHORT TERM GOAL #2   Title Pt will be Mod I home program RUE  supervision and mod cueing   Status Revised     OT SHORT TERM GOAL #3   Title Pt/family will I state 2-3 pieces of a/e &/or DME for home use for increased independnece and safety   Status On-going     OT SHORT TERM GOAL #4   Title Patient will dress lower body with mod cueing and intermittent mod assistance following set up.   Time 4   Period Weeks   Status New     OT SHORT TERM GOAL #5   Title Patient will dress upper body with set up and minimal assistance   Time 4   Period Weeks   Status New           OT Long Term Goals - 06/27/16 1221      OT LONG TERM GOAL #1   Title Pt will be Min A LB/UB Dressing and ADL's   Baseline Mod-max A  Time 8   Period Weeks   Status New     OT LONG TERM GOAL #2   Title Pt will be Mod I-supervision updated HEP RUE   Time 8   Period Weeks   Status New     OT LONG TERM GOAL #3   Title Pt will demonstrate improved RUE strength as seen by 3+/5 MMT   Baseline -3/5 RUE   Time 8   Period Weeks   Status New     OT LONG TERM GOAL #4   Title Pt will demonstrate improved R hand coordination and grip strength as seen by improved Box & Blocks to 30 or greater and grip 30# or greater R.   Baseline Grip 19#; Box and Blocks = 13 (06/27/16)   Time 8   Period Weeks   Status New     OT LONG TERM GOAL #5   Title Pt will demonstrate improved functional mobility and balance as seen by supervision assist for simulated shower transfers   Baseline Moderate (06/27/16)   Time 8   Period Weeks   Status New               Plan - 07/11/16 [id number]     Clinical Impression Statement Pt is progressing towards goals. Pt/ daughter verbalize understanding of HEP. Pt requires v.c. to attend to right side.   Rehab Potential Fair   Clinical Impairments Affecting Rehab Potential severity of deficits; cognitive deficits   OT Frequency 2x / week   OT Duration 8 weeks   OT Treatment/Interventions Self-care/ADL training;Therapeutic exercise;DME and/or AE instruction;Therapeutic activities;Patient/family education;Balance training;Neuromuscular education;Energy conservation;Passive range of motion;Therapeutic exercises;Visual/perceptual remediation/compensation   Plan ADL activity, attention-consider simple board game   Consulted and Agree with Plan of Care Patient;Family member/caregiver      Patient will benefit from skilled therapeutic intervention in order to improve the following deficits and impairments:  Decreased balance, Decreased endurance, Impaired vision/preception, Decreased range of motion, Decreased knowledge of precautions, Decreased cognition, Decreased activity tolerance, Decreased coordination, Decreased knowledge of use of DME, Decreased safety awareness, Decreased strength, Impaired flexibility, Impaired UE functional use, Decreased mobility, Difficulty walking  Visit Diagnosis: Muscle weakness (generalized)  Other lack of coordination  Cognitive social or emotional deficit following cerebral infarction    Problem List Patient Active Problem List   Diagnosis Date Noted  . Seizure-like activity (HCC) 06/28/2016  . Cerebral infarction due to unspecified mechanism   . Gait difficulty   . Memory loss 05/22/2016  . Confusion   . Acute ischemic stroke (HCC) 05/18/2016  . Morbid obesity (HCC) 09/30/2015  . Uncontrolled type 2 diabetes mellitus with circulatory disorder, without long-term current use of insulin (HCC)   . Essential hypertension   . Cerebrovascular accident (CVA) due to thrombosis of cerebral artery (HCC)   .  CVA (cerebral infarction) 08/07/2015  . Pure hypercholesterolemia 10/04/2009    Remy Dia 07/11/2016, 9:40 AM  [place] [facility] [address] [place], [name], [address] Phone: [phone]   Fax:  [phone]  Name: Salena SanerBetty H Mccandlish MRN: [id number] Date of Birth: 07/22/1966

## 2016-07-11 NOTE — Telephone Encounter (Signed)
Rn cancel EEG per Dr. Roda ShuttersXu.

## 2016-07-12 ENCOUNTER — Other Ambulatory Visit: Payer: Commercial Managed Care - HMO

## 2016-07-12 NOTE — Therapy (Signed)
[facility] [address] [place], [place], [address] Phone: [phone]   Fax:  [phone]  Physical Therapy Treatment  Patient Details  Name: Annette Munoz MRN: [id number] Date of Birth: 1966/01/29 Referring Provider: Marvel Plan  Encounter Date: 07/11/2016      PT End of Session - 07/11/16 1025    Visit Number 4   Number of Visits 19   Date for PT Re-Evaluation 08/25/16   Authorization Type [employer] 60 visit limit combined   PT Start Time 1020   PT Stop Time 1100   PT Time Calculation (min) 40 min   Equipment Utilized During Treatment Gait belt   Activity Tolerance Patient tolerated treatment well   Behavior During Therapy Flat affect      Past Medical History:  Diagnosis Date  . Diabetes mellitus without complication (HCC)   . Hypertension   . Stroke [facility])     Past Surgical History:  Procedure Laterality Date  . CESAREAN SECTION    . TEE WITHOUT CARDIOVERSION N/A 08/10/2015   Procedure: TRANSESOPHAGEAL ECHOCARDIOGRAM (TEE);  Surgeon: Chrystie Nose, MD;  Location: [facility] ENDOSCOPY;  Service: Cardiovascular;  Laterality: N/A;    There were no vitals filed for this visit.      Subjective Assessment - 07/11/16 1024    Subjective No new complaints. No falls to report. No pain per pt report.   Patient is accompained by: Family member   Pertinent History HTN, DM, CVA (x 2), with recent TIA per family report; weakness on R side new since 06/18/16   Patient Stated Goals Pt's goals for therapy are to "do everything I need to do."   Currently in Pain? No/denies              [facility] Adult PT Treatment/Exercise - 07/11/16 1045      Transfers   Transfers Sit to Stand;Stand to Sit   Sit to Stand 4: Min guard;With upper extremity assist;4: Min assist;From bed   Sit to Stand Details Tactile cues for sequencing;Tactile cues for placement;Tactile cues for weight beaing;Verbal cues for technique;Verbal cues for  sequencing   Stand to Sit 4: Min guard;4: Min assist;With upper extremity assist;To bed   Stand to Sit Details (indicate cue type and reason) Tactile cues for sequencing;Tactile cues for placement;Verbal cues for sequencing;Verbal cues for technique     Ambulation/Gait   Ambulation/Gait Yes   Ambulation/Gait Assistance 4: Min guard;4: Min assist   Ambulation/Gait Assistance Details cues on posture, walker position with gait, right foot clearance and step placement with gait.    Ambulation Distance (Feet) 115 Feet   Assistive device Rolling walker   Gait Pattern Step-to pattern;Step-through pattern;Decreased step length - right;Decreased stance time - right;Decreased dorsiflexion - right;Decreased weight shift to right;Right foot flat;Right genu recurvatum;Poor foot clearance - right   Ambulation Surface Level;Indoor     Exercises   Other Exercises  tall kneeling with hands on red ball: alternating UE raises x 10 reps, partial sit backs x 10 reps. min assist for balance with cues on form and technique     Lumbar Exercises: Quadruped   Single Arm Raise Right;Left;10 reps;Limitations   Single Arm Raises Limitations assist for balance while over red pball: alternating UE raises x 10 each side.   Straight Leg Raise 10 reps;Limitations   Straight Leg Raises Limitations over red ball, assist needed for balance with left LE lifts, cues on weight shifting and to slow down to ensure balance prior to  lifing a leg up. alternating legs x 10 reps each side     Knee/Hip Exercises: Supine   Short Arc Quad Sets AROM;Strengthening;Right;2 sets;10 reps   Short Arc [employer]ad Sets Limitations cues on form and technique   Bridges AROM;Strengthening;Both;1 set;10 reps;Limitations   Bridges Limitations cues for hold times and to lift fully up off mat   Straight Leg Raises AROM;AAROM;Strengthening;Right;1 set;10 reps;Limitations   Other Supine Knee/Hip Exercises supine with bil legs extended out on mat: bil LE hip  IR/ER active x 10 reps (provided picture for HEP today, was issued with yesterday's session)            PT Short Term Goals - 06/26/16 1252      PT SHORT TERM GOAL #1   Title Pt/family will verbalize/demo understanding of HEP for strength, balance, transfers, and gait.  TARGET 07/25/16   Time 4   Period Weeks   Status New     PT SHORT TERM GOAL #2   Title Pt will perform sit<>stand transfers with correct sequence, with supervision, 8 of 10 trials, for improved safety and efficiency with transfers.   Time 4   Period Weeks   Status New     PT SHORT TERM GOAL #3   Title Pt will improve TUG score to less than or equal to 28 seconds for decreased fall risk.   Time 4   Period Weeks   Status New     PT SHORT TERM GOAL #4   Title Pt will negotiate at least 4 steps using 1 handrail, with min asistance, for improved safety with home stair negotiation.   Time 4   Period Weeks   Status New     PT SHORT TERM GOAL #5   Title Pt will improve Berg score by at least 5 points (>11/56) for decreased fall risk.   Time 4   Period Weeks   Status New           PT Long Term Goals - 06/26/16 1256      PT LONG TERM GOAL #1   Title Pt/family will verbalize understanding of fall prevention within the home environment.  TARGET 08/25/16   Time 8   Period Weeks   Status New     PT LONG TERM GOAL #2   Title Pt will improve gait velocity to at least 1.3 ft/sec for improved household gait.   Time 8   Period Weeks   Status New     PT LONG TERM GOAL #3   Title Pt will improve TUG score to less than or equal to 20 seconds for decreased fall risk.   Time 8   Period Weeks     PT LONG TERM GOAL #4   Title Pt will improve Berg Balance score to at least 21/56 for decreased fall risk.   Time 8   Period Weeks   Status New     PT LONG TERM GOAL #5   Title Pt will ambulate at least 500 ft, indoor/outdoor surfaces with supervision, for improved household and community ambulation.   Time 8    Period Weeks   Status New     Additional Long Term Goals   Additional Long Term Goals Yes     PT LONG TERM GOAL #6   Title Pt will negotiate at least 4 steps, 1 rail, with supervision of family, for improved safety with stair negotiation into/out of the home.   Time 8   Period Weeks   Status  New           Plan - 07/11/16 1026    Clinical Impression Statement today's session continued to address LE strengthening and gait working towards pt's STGs. Pt continues to need cues for safety and redirection to task during session. Pt is progressing toward goals and should benefit from continued PT to progress toward unmet goals.    Rehab Potential Fair   Clinical Impairments Affecting Rehab Potential Cognition (pt does have family support of husband and daughter)   PT Frequency 2x / week   PT Duration Other (comment)  9 weeks; plus eval   PT Treatment/Interventions ADLs/Self Care Home Management;Functional mobility training;Stair training;Gait training;DME Instruction;Therapeutic activities;Therapeutic exercise;Balance training;Neuromuscular re-education;Patient/family education;Orthotic Fit/Training   PT Next Visit Plan lower extremity strengthening, transfers, gait; orthotist plans to bring R AFO next week.   Consulted and Agree with Plan of Care Patient;Family member/caregiver   Family Member Consulted husband and daughter      Patient will benefit from skilled therapeutic intervention in order to improve the following deficits and impairments:  Abnormal gait, Decreased balance, Decreased cognition, Decreased mobility, Decreased knowledge of use of DME, Decreased safety awareness, Decreased strength, Difficulty walking, Postural dysfunction, Impaired tone, Increased fascial restricitons  Visit Diagnosis: Muscle weakness (generalized)  Other abnormalities of gait and mobility  Unsteadiness on feet     Problem List Patient Active Problem List   Diagnosis Date Noted  .  Seizure-like activity (HCC) 06/28/2016  . Cerebral infarction due to unspecified mechanism   . Gait difficulty   . Memory loss 05/22/2016  . Confusion   . Acute ischemic stroke (HCC) 05/18/2016  . Morbid obesity (HCC) 09/30/2015  . Uncontrolled type 2 diabetes mellitus with circulatory disorder, without long-term current use of insulin (HCC)   . Essential hypertension   . Cerebrovascular accident (CVA) due to thrombosis of cerebral artery (HCC)   . CVA (cerebral infarction) 08/07/2015  . Pure hypercholesterolemia 10/04/2009    Sallyanne Kuster, PTA, [facility] [address], [address] [place], [place] [address] [phone] 07/12/16, 2:20 PM   Name: Annette Munoz MRN: [id number] Date of Birth: [birth year]

## 2016-07-17 ENCOUNTER — Encounter: Payer: Self-pay | Admitting: Physical Therapy

## 2016-07-17 ENCOUNTER — Ambulatory Visit: Payer: Commercial Managed Care - HMO | Attending: Family Medicine | Admitting: Physical Therapy

## 2016-07-17 ENCOUNTER — Ambulatory Visit: Payer: Commercial Managed Care - HMO | Admitting: Occupational Therapy

## 2016-07-17 DIAGNOSIS — I639 Cerebral infarction, unspecified: Secondary | ICD-10-CM | POA: Insufficient documentation

## 2016-07-17 DIAGNOSIS — M6281 Muscle weakness (generalized): Secondary | ICD-10-CM

## 2016-07-17 DIAGNOSIS — R41841 Cognitive communication deficit: Secondary | ICD-10-CM | POA: Insufficient documentation

## 2016-07-17 DIAGNOSIS — R278 Other lack of coordination: Secondary | ICD-10-CM | POA: Insufficient documentation

## 2016-07-17 DIAGNOSIS — R2681 Unsteadiness on feet: Secondary | ICD-10-CM | POA: Diagnosis present

## 2016-07-17 DIAGNOSIS — R2689 Other abnormalities of gait and mobility: Secondary | ICD-10-CM | POA: Diagnosis present

## 2016-07-17 DIAGNOSIS — I69315 Cognitive social or emotional deficit following cerebral infarction: Secondary | ICD-10-CM | POA: Diagnosis present

## 2016-07-17 NOTE — Therapy (Signed)
[facility] [address] [place], [place], [address] Phone: [phone]   Fax:  [phone]  Occupational Therapy Treatment  Patient Details  Name: Annette Munoz MRN: [id number] Date of Birth: 1966-01-18 Referring Provider: Zenia Resides. Thedore Mins  Encounter Date: 07/17/2016      OT End of Session - 07/17/16 1025    Visit Number 5   Number of Visits 16   Date for OT Re-Evaluation 08/22/16   Authorization Type [employer] 60 Visit Limit Combined   Authorization - Visit Number 5   Authorization - Number of Visits 16   OT Start Time 1019   OT Stop Time 1100   OT Time Calculation (min) 41 min   Activity Tolerance Patient tolerated treatment well   Behavior During Therapy Flat affect      Past Medical History:  Diagnosis Date  . Diabetes mellitus without complication (HCC)   . Hypertension   . Stroke [facility])     Past Surgical History:  Procedure Laterality Date  . CESAREAN SECTION    . TEE WITHOUT CARDIOVERSION N/A 08/10/2015   Procedure: TRANSESOPHAGEAL ECHOCARDIOGRAM (TEE);  Surgeon: Chrystie Nose, MD;  Location: [facility] ENDOSCOPY;  Service: Cardiovascular;  Laterality: N/A;    There were no vitals filed for this visit.      Subjective Assessment - 07/17/16 1024    Subjective  "I'm having problems this morning"   Patient is accompained by: Family member   Currently in Pain? No/denies        Placing shapes in Perfection board (without timer) for attention, visual scanning, R hand coordination and attention to R side.  Pt needed mod cues for strategy initially due to difficulty, but once she got started, she stopped using strategy and was able to complete with min difficulty with ataxia and shoulder compensation.  Then, matching clock faces to digital times with min cueing initially for directions and to use RUE. (to encourage attention, visual scanning, RUE functional use.  Pt  completed with incr time.    Practicing buttoning with incr time (on tabletop) and pt performed with min difficulty/incr time.  Pt able to tie bow with min difficulty.  Functional reaching to place large pegs in vertical pegboard overhead with min difficulty/drops for incr coordination/activity tolerance with frequent rest breaks.  Pt needed v.c. Initially to use RUE.   Flipping cards with R hand and dealing cards with thumb with incr time and min difficulty  for incr coordination.                      OT Short Term Goals - 07/17/16 1040      OT SHORT TERM GOAL #1   Title Pt will be Mod I signs and symtpoms of CVA/TIA   Status Deferred     OT SHORT TERM GOAL #2   Title Pt will be Mod I home program RUE  supervision and mod cueing   Status Revised     OT SHORT TERM GOAL #3   Title Pt/family will I state 2-3 pieces of a/e &/or DME for home use for increased independnece and safety   Status On-going     OT SHORT TERM GOAL #4   Title Patient will dress lower body with mod cueing and intermittent mod assistance following set up.   Time 4   Period Weeks   Status Achieved  07/17/16:  min-mod A per dtr report     OT  SHORT TERM GOAL #5   Title Patient will dress upper body with set up and minimal assistance   Time 4   Period Weeks   Status Achieved  07/17/16: per dtr report           OT Long Term Goals - 06/27/16 1221      OT LONG TERM GOAL #1   Title Pt will be Min A LB/UB Dressing and ADL's   Baseline Mod-max A   Time 8   Period Weeks   Status New     OT LONG TERM GOAL #2   Title Pt will be Mod I-supervision updated HEP RUE   Time 8   Period Weeks   Status New     OT LONG TERM GOAL #3   Title Pt will demonstrate improved RUE strength as seen by 3+/5 MMT   Baseline -3/5 RUE   Time 8   Period Weeks   Status New     OT LONG TERM GOAL #4   Title Pt will demonstrate improved R hand coordination and grip strength as seen by improved Box & Blocks  to 30 or greater and grip 30# or greater R.   Baseline Grip 19#; Box and Blocks = 13 (06/27/16)   Time 8   Period Weeks   Status New     OT LONG TERM GOAL #5   Title Pt will demonstrate improved functional mobility and balance as seen by supervision assist for simulated shower transfers   Baseline Moderate (06/27/16)   Time 8   Period Weeks   Status New               Plan - 07/17/16 1041    Clinical Impression Statement Pt is progressing towards goals.  Dtr reports incr ADL performance.  Pt demo improved RUE functional use, but continues to require min v.c. to attend to R side   Rehab Potential Fair   Clinical Impairments Affecting Rehab Potential severity of deficits; cognitive deficits   OT Frequency 2x / week   OT Duration 8 weeks   OT Treatment/Interventions Self-care/ADL training;Therapeutic exercise;DME and/or AE instruction;Therapeutic activities;Patient/family education;Balance training;Neuromuscular education;Energy conservation;Passive range of motion;Therapeutic exercises;Visual/perceptual remediation/compensation   Plan ADL activities, RUE functional use   OT Home Exercise Plan 07/10/16:  cane and simple coordination HEP   Consulted and Agree with Plan of Care Patient;Family member/caregiver   Family Member Consulted Husband Mosetta PuttGrady, daughter      Patient will benefit from skilled therapeutic intervention in order to improve the following deficits and impairments:  Decreased balance, Decreased endurance, Impaired vision/preception, Decreased range of motion, Decreased knowledge of precautions, Decreased cognition, Decreased activity tolerance, Decreased coordination, Decreased knowledge of use of DME, Decreased safety awareness, Decreased strength, Impaired flexibility, Impaired UE functional use, Decreased mobility, Difficulty walking  Visit Diagnosis: Other lack of coordination  Muscle weakness (generalized)  Cognitive social or emotional deficit following cerebral  infarction    Problem List Patient Active Problem List   Diagnosis Date Noted  . Seizure-like activity (HCC) 06/28/2016  . Cerebral infarction due to unspecified mechanism   . Gait difficulty   . Memory loss 05/22/2016  . Confusion   . Acute ischemic stroke (HCC) 05/18/2016  . Morbid obesity (HCC) 09/30/2015  . Uncontrolled type 2 diabetes mellitus with circulatory disorder, without long-term current use of insulin (HCC)   . Essential hypertension   . Cerebrovascular accident (CVA) due to thrombosis of cerebral artery (HCC)   . CVA (cerebral infarction) 08/07/2015  .  Pure hypercholesterolemia 10/04/2009    [facility] 07/17/2016, 10:57 AM  [facility] [address] [place], [place], [address] Phone: [phone]   Fax:  [phone]  Name: Annette Munoz MRN: [id number] Date of Birth: Jun 05, 1966   Willa Frater, OTR/L [name] [address] [place], [place]  [address] [phone] phone [phone] 07/17/16 10:57 AM

## 2016-07-17 NOTE — Therapy (Signed)
[facility] [address] [place], [place], [address] Phone: [phone]   Fax:  [phone]  Physical Therapy Treatment  Patient Details  Name: Annette Munoz MRN: [id number] Date of Birth: [birth year] Referring Provider: Marvel Plan  Encounter Date: 07/17/2016      PT End of Session - 07/17/16 0939    Visit Number 7   Number of Visits 19   Date for PT Re-Evaluation 08/25/16   Authorization Type [employer] 60 visit limit combined   PT Start Time 0933   PT Stop Time 1015   PT Time Calculation (min) 42 min   Equipment Utilized During Treatment Gait belt   Activity Tolerance Patient tolerated treatment well   Behavior During Therapy Flat affect      Past Medical History:  Diagnosis Date  . Diabetes mellitus without complication (HCC)   . Hypertension   . Stroke [facility])     Past Surgical History:  Procedure Laterality Date  . CESAREAN SECTION    . TEE WITHOUT CARDIOVERSION N/A 08/10/2015   Procedure: TRANSESOPHAGEAL ECHOCARDIOGRAM (TEE);  Surgeon: Chrystie Nose, MD;  Location: [facility] ENDOSCOPY;  Service: Cardiovascular;  Laterality: N/A;    There were no vitals filed for this visit.      Subjective Assessment - 07/17/16 0938    Subjective No new complaints. No falls to report. No pain per pt report.   Patient is accompained by: Family member   Pertinent History HTN, DM, CVA (x 2), with recent TIA per family report; weakness on R side new since 06/18/16   Patient Stated Goals Pt's goals for therapy are to "do everything I need to do."   Currently in Pain? No/denies            [facility] Adult PT Treatment/Exercise - 07/17/16 0941      Lumbar Exercises: Quadruped   Single Arm Raise Right;Left;10 reps;Limitations   Single Arm Raise Weights (lbs) over red pball for core support, cues/assist needed for ex form and balance   Straight Leg Raise 10 reps;Limitations   Straight Leg Raises Limitations over red ball, assist  needed for balance with left LE lifts, cues on weight shifting and to slow down to ensure balance prior to lifing a leg up. alternating legs x 10 reps each side     Knee/Hip Exercises: Seated   Other Seated Knee/Hip Exercises sit<>stand x 10 reps with left foot on 4 inch box with cues/facilitation to maintain neutral posture and increase weight shifting onto right side. sit<>stand x 5 reps with feet on airex with cues on form and equal weight bearing. min guard assist to min assist for balance along with facilitation to increase right side weight shifting.                      Sit to Sand 1 set;10 reps;Other (comment);5 reps  x10 with left foot on 4 inch box, x 5 with bil feet on airex     Knee/Hip Exercises: Supine   Short Arc Quad Sets AROM;Strengthening;Right;2 sets;10 reps;Limitations  2# ankle weight   Short Arc Quad Sets Limitations cues for full knee extension, for hold times and to slow down for increased control with exercises   Bridges AROM;Strengthening;Both;1 set;10 reps;Limitations   Bridges Limitations cues for hold times and to lift fully up off mat with both use of bil LE's and with right single leg bridges.  with right single leg bridges had pt's left leg propped up on  stool on mat.    Single Leg Bridge AROM;Strengthening;Right;2 sets;10 reps;Limitations   Straight Leg Raises AROM;Strengthening;Right;2 sets;10 reps;Limitations   Straight Leg Raises Limitations cues/assist for maintaining knee extension and for hold times, cues/assist needed for slow lowering as well     Knee/Hip Exercises: Prone   Hip Extension AROM;AAROM;Strengthening;Right;2 sets;10 reps;Limitations  glut kick backs (with knee flexion)   Hip Extension Limitations assist and cues needed for correct ex form. pt started out active and needed assist as reps progressed due to fatigue.   Straight Leg Raises AROM;AAROM;Strengthening;Right;2 sets;10 reps;Limitations   Straight Leg Raises Limitations cues on form and  technique. increased assistance needed as reps progressed.             PT Short Term Goals - 06/26/16 1252      PT SHORT TERM GOAL #1   Title Pt/family will verbalize/demo understanding of HEP for strength, balance, transfers, and gait.  TARGET 07/25/16   Time 4   Period Weeks   Status New     PT SHORT TERM GOAL #2   Title Pt will perform sit<>stand transfers with correct sequence, with supervision, 8 of 10 trials, for improved safety and efficiency with transfers.   Time 4   Period Weeks   Status New     PT SHORT TERM GOAL #3   Title Pt will improve TUG score to less than or equal to 28 seconds for decreased fall risk.   Time 4   Period Weeks   Status New     PT SHORT TERM GOAL #4   Title Pt will negotiate at least 4 steps using 1 handrail, with min asistance, for improved safety with home stair negotiation.   Time 4   Period Weeks   Status New     PT SHORT TERM GOAL #5   Title Pt will improve Berg score by at least 5 points (>11/56) for decreased fall risk.   Time 4   Period Weeks   Status New           PT Long Term Goals - 06/26/16 1256      PT LONG TERM GOAL #1   Title Pt/family will verbalize understanding of fall prevention within the home environment.  TARGET 08/25/16   Time 8   Period Weeks   Status New     PT LONG TERM GOAL #2   Title Pt will improve gait velocity to at least 1.3 ft/sec for improved household gait.   Time 8   Period Weeks   Status New     PT LONG TERM GOAL #3   Title Pt will improve TUG score to less than or equal to 20 seconds for decreased fall risk.   Time 8   Period Weeks     PT LONG TERM GOAL #4   Title Pt will improve Berg Balance score to at least 21/56 for decreased fall risk.   Time 8   Period Weeks   Status New     PT LONG TERM GOAL #5   Title Pt will ambulate at least 500 ft, indoor/outdoor surfaces with supervision, for improved household and community ambulation.   Time 8   Period Weeks   Status New      Additional Long Term Goals   Additional Long Term Goals Yes     PT LONG TERM GOAL #6   Title Pt will negotiate at least 4 steps, 1 rail, with supervision of family, for improved safety with stair negotiation  into/out of the home.   Time 8   Period Weeks   Status New            Plan - 07/17/16 1610    Clinical Impression Statement today's session continued to focus on right LE strengthening and also on increased use of her right LE with sit<>stand transfers. Pt continues to favor her left LE with decreased weight shifting onto her right LE despite cues/practice in today's session. Will need additional work on this. Pt is making steady progress toward goals and should benefit from continued PT to progress toward unmet goals.                                        Rehab Potential Fair   Clinical Impairments Affecting Rehab Potential Cognition (pt does have family support of husband and daughter)   PT Frequency 2x / week   PT Duration Other (comment)  9 weeks; plus eval   PT Treatment/Interventions ADLs/Self Care Home Management;Functional mobility training;Stair training;Gait training;DME Instruction;Therapeutic activities;Therapeutic exercise;Balance training;Neuromuscular re-education;Patient/family education;Orthotic Fit/Training   PT Next Visit Plan lower extremity strengthening, transfers, gait; orthotist plans to bring R AFO this/next week.   Consulted and Agree with Plan of Care Patient;Family member/caregiver   Family Member Consulted husband and daughter      Patient will benefit from skilled therapeutic intervention in order to improve the following deficits and impairments:  Abnormal gait, Decreased balance, Decreased cognition, Decreased mobility, Decreased knowledge of use of DME, Decreased safety awareness, Decreased strength, Difficulty walking, Postural dysfunction, Impaired tone, Increased fascial restricitons  Visit Diagnosis: Muscle weakness  (generalized)  Unsteadiness on feet  Other abnormalities of gait and mobility     Problem List Patient Active Problem List   Diagnosis Date Noted  . Seizure-like activity (HCC) 06/28/2016  . Cerebral infarction due to unspecified mechanism   . Gait difficulty   . Memory loss 05/22/2016  . Confusion   . Acute ischemic stroke (HCC) 05/18/2016  . Morbid obesity (HCC) 09/30/2015  . Uncontrolled type 2 diabetes mellitus with circulatory disorder, without long-term current use of insulin (HCC)   . Essential hypertension   . Cerebrovascular accident (CVA) due to thrombosis of cerebral artery (HCC)   . CVA (cerebral infarction) 08/07/2015  . Pure hypercholesterolemia 10/04/2009    Sallyanne Kuster, PTA, [facility] [address], [address] [place], [place] [address] [phone] 07/17/16, 1:38 PM   Name: Annette Munoz MRN: [id number] Date of Birth: 1966-07-08

## 2016-07-18 ENCOUNTER — Ambulatory Visit: Payer: Commercial Managed Care - HMO

## 2016-07-18 DIAGNOSIS — M6281 Muscle weakness (generalized): Secondary | ICD-10-CM | POA: Diagnosis not present

## 2016-07-18 DIAGNOSIS — R41841 Cognitive communication deficit: Secondary | ICD-10-CM

## 2016-07-19 ENCOUNTER — Ambulatory Visit: Payer: Commercial Managed Care - HMO | Admitting: Physical Therapy

## 2016-07-19 ENCOUNTER — Encounter: Payer: Self-pay | Admitting: Physical Therapy

## 2016-07-19 ENCOUNTER — Ambulatory Visit: Payer: Commercial Managed Care - HMO | Admitting: Occupational Therapy

## 2016-07-19 DIAGNOSIS — I69315 Cognitive social or emotional deficit following cerebral infarction: Secondary | ICD-10-CM

## 2016-07-19 DIAGNOSIS — M6281 Muscle weakness (generalized): Secondary | ICD-10-CM

## 2016-07-19 DIAGNOSIS — R278 Other lack of coordination: Secondary | ICD-10-CM

## 2016-07-19 DIAGNOSIS — R2689 Other abnormalities of gait and mobility: Secondary | ICD-10-CM

## 2016-07-19 DIAGNOSIS — R2681 Unsteadiness on feet: Secondary | ICD-10-CM

## 2016-07-19 NOTE — Therapy (Addendum)
[facility] [address] [place], [place], [address] Phone: [phone]   Fax:  [phone]  Occupational Therapy Treatment  Patient Details  Name: AMETHYST GAINER MRN: [id number] Date of Birth: 10-11-1966 Referring Provider: Zenia Resides. Thedore Mins  Encounter Date: 07/19/2016      OT End of Session - 07/19/16 0905    Visit Number 6   Number of Visits 16   Date for OT Re-Evaluation 08/22/16   Authorization Type [employer] H[employer] 60 Visit Limit Combined   Authorization - Visit Number 6   Authorization - Number of Visits 16   OT Start Time [phone]   OT Stop Time 0930   OT Time Calculation (min) 38 min   Activity Tolerance Patient tolerated treatment well   Behavior During Therapy Flat affect      Past Medical History:  Diagnosis Date  . Diabetes mellitus without complication (HCC)   . Hypertension   . Stroke [facility])     Past Surgical History:  Procedure Laterality Date  . CESAREAN SECTION    . TEE WITHOUT CARDIOVERSION N/A 08/10/2015   Procedure: TRANSESOPHAGEAL ECHOCARDIOGRAM (TEE);  Surgeon: Chrystie Nose, MD;  Location: [facility] ENDOSCOPY;  Service: Cardiovascular;  Laterality: N/A;    There were no vitals filed for this visit.      Subjective Assessment - 07/19/16 0856    Subjective  Pt reports that she is doing good    Patient is accompained by: Family member   Currently in Pain? No/denies        Pt completed 25 piece puzzle with mod cueing for problem solving (using strategies) and mod cueing to use RUE to assist.  Once pt completed edges, pt completed remaining puzzle with min cueing.  Copied small peg design (mod complex).  Pt cued to use R hand for incr coordination (min difficulty/incr time) and copied with min-mod cueing.    Pt distracted 2-3 times during session by busy environment, but redirected herself to task without cues.  Discussed progress and difficulties noted during  session to incr awareness.  Recommended pt/dtr. Discuss safety with Physical therapy prior to attempting to return to water exercises.  Pt/dtr verbalized understanding.    Pt/dtr given info for neuro ophthalmologist due to dtr wanting to schedule pt for visual exam.  Recommended neuro ophthalmologist and discussed inattention vs. Visual perceptual difficulties.                      OT Education - 07/19/16 1331    Education Details Recommended performing simple jigsaw puzzles at home;  Educated in importance of minimal cueing as possible (questioning cues as able) and allowing incr time for pt to respond/correct mistakes for problem solving/learning as long as pt is safe (emphasized immediate cueing/assist for safety concerns).   Person(s) Educated Patient;Child(ren)   Methods Explanation;Verbal cues;Demonstration   Comprehension Verbalized understanding          OT Short Term Goals - 07/17/16 1040      OT SHORT TERM GOAL #1   Title Pt will be Mod I signs and symtpoms of CVA/TIA   Status Deferred     OT SHORT TERM GOAL #2   Title Pt will be Mod I home program RUE  supervision and mod cueing   Status Revised     OT SHORT TERM GOAL #3   Title Pt/family will I state 2-3 pieces of a/e &/or DME for home use for increased independnece and  safety   Status On-going     OT SHORT TERM GOAL #4   Title Patient will dress lower body with mod cueing and intermittent mod assistance following set up.   Time 4   Period Weeks   Status Achieved  07/17/16:  min-mod A per dtr report     OT SHORT TERM GOAL #5   Title Patient will dress upper body with set up and minimal assistance   Time 4   Period Weeks   Status Achieved  07/17/16: per dtr report           OT Long Term Goals - 06/27/16 1221      OT LONG TERM GOAL #1   Title Pt will be Min A LB/UB Dressing and ADL's   Baseline Mod-max A   Time 8   Period Weeks   Status New     OT LONG TERM GOAL #2   Title Pt will  be Mod I-supervision updated HEP RUE   Time 8   Period Weeks   Status New     OT LONG TERM GOAL #3   Title Pt will demonstrate improved RUE strength as seen by 3+/5 MMT   Baseline -3/5 RUE   Time 8   Period Weeks   Status New     OT LONG TERM GOAL #4   Title Pt will demonstrate improved R hand coordination and grip strength as seen by improved Box & Blocks to 30 or greater and grip 30# or greater R.   Baseline Grip 19#; Box and Blocks = 13 (06/27/16)   Time 8   Period Weeks   Status New     OT LONG TERM GOAL #5   Title Pt will demonstrate improved functional mobility and balance as seen by supervision assist for simulated shower transfers   Baseline Moderate (06/27/16)   Time 8   Period Weeks   Status New               Plan - 07/19/16 0908    Clinical Impression Statement Pt is progressing toward goals with improved attention, but continues to need cueing for problem solving and to use RUE.  Pt also demo decr shoulder compensation for fine motor tasks today.   Rehab Potential Fair   Clinical Impairments Affecting Rehab Potential severity of deficits; cognitive deficits   OT Frequency 2x / week   OT Duration 8 weeks   OT Treatment/Interventions Self-care/ADL training;Therapeutic exercise;DME and/or AE instruction;Therapeutic activities;Patient/family education;Balance training;Neuromuscular education;Energy conservation;Passive range of motion;Therapeutic exercises;Visual/perceptual remediation/compensation   Plan begin checking STGs    OT Home Exercise Plan 07/10/16:  cane and simple coordination HEP   Consulted and Agree with Plan of Care Patient;Family member/caregiver   Family Member Consulted Husband Mosetta Putt, daughter      Patient will benefit from skilled therapeutic intervention in order to improve the following deficits and impairments:  Decreased balance, Decreased endurance, Impaired vision/preception, Decreased range of motion, Decreased knowledge of precautions,  Decreased cognition, Decreased activity tolerance, Decreased coordination, Decreased knowledge of use of DME, Decreased safety awareness, Decreased strength, Impaired flexibility, Impaired UE functional use, Decreased mobility, Difficulty walking  Visit Diagnosis: Cognitive social or emotional deficit following cerebral infarction  Other lack of coordination  Muscle weakness (generalized)    Problem List Patient Active Problem List   Diagnosis Date Noted  . Seizure-like activity (HCC) 06/28/2016  . Cerebral infarction due to unspecified mechanism   . Gait difficulty   . Memory loss 05/22/2016  .  Confusion   . Acute ischemic stroke (HCC) 05/18/2016  . Morbid obesity (HCC) 09/30/2015  . Uncontrolled type 2 diabetes mellitus with circulatory disorder, without long-term current use of insulin (HCC)   . Essential hypertension   . Cerebrovascular accident (CVA) due to thrombosis of cerebral artery (HCC)   . CVA (cerebral infarction) 08/07/2015  . Pure hypercholesterolemia 10/04/2009    [facility] ServicesFREEMAN,ANGELA 07/19/2016, 1:42 PM  [facility] [address] [place], [name], [address] Phone: [phone]   Fax:  [phone]  Name: Salena SanerBetty H Abood MRN: [id number] Date of Birth: 02/13/1966  Willa FraterAngela Freeman, OTR/L [facility] [address] [place], [name]  [address] [phone] phone [phone] 07/19/16 1:42 PM

## 2016-07-19 NOTE — Patient Instructions (Signed)
  Please complete the assigned speech therapy homework and return it to your next session.  

## 2016-07-19 NOTE — Therapy (Signed)
[facility] [address] [place], [name], [address] Phone: [phone]   Fax:  [phone]  Speech Language Pathology Treatment  Patient Details  Name: Annette Munoz MRN: [id number] Date of Birth: 11/08/1965 Referring Provider: Dr. Renaye RakersVeita Bland  Encounter Date: 07/18/2016      End of Session - 07/19/16 0845    Visit Number 5   Number of Visits 17   Date for SLP Re-Evaluation 09/08/16   Activity Tolerance Patient tolerated treatment well      Past Medical History:  Diagnosis Date  . Diabetes mellitus without complication (HCC)   . Hypertension   . Stroke [facility](HCC)     Past Surgical History:  Procedure Laterality Date  . CESAREAN SECTION    . TEE WITHOUT CARDIOVERSION N/A 08/10/2015   Procedure: TRANSESOPHAGEAL ECHOCARDIOGRAM (TEE);  Surgeon: Chrystie NoseKenneth C Hilty, MD;  [facility];  Service: Cardiovascular;  Laterality: N/A;    There were no vitals filed for this visit.      Subjective Assessment - 07/18/16 1118    Subjective Pt 15 minutes late. "We forgot the notebook." (husband)   Patient is accompained by: Family member  husband   Currently in Pain? Other (Comment)               ADULT SLP TREATMENT - 07/19/16 0001      General Information   Behavior/Cognition Alert;Cooperative;Distractible;Decreased sustained attention     Treatment Provided   Treatment provided Cognitive-Linquistic     Pain Assessment   Pain Assessment No/denies pain     Cognitive-Linquistic Treatment   Treatment focused on Cognition   Skilled Treatment "I can't write this morning!" or, "My handwriting is awful!" said x10 during a ten minute task. Pt required verbal and visual cues consistently during the inital portions of the task, and then verbal and visual cues occasionally by the end of the task. Organizing 20 items into 5 categories took pt 25 minutes, with SLP explanation of suggested tasks for family to try at  home x3 during this time. Pt noted to perseverate re: her handwriting, with SLP asking pt each time why her handwriting was poor, "I guess it's because of the stroke," pt consistently answered.     Assessment / Recommendations / Plan   Plan Continue with current plan of care     Progression Toward Goals   Progression toward goals Progressing toward goals          SLP Education - 07/19/16 0845    Education provided Yes   Education Details appropriate/beneficial activities to try at home   Person(s) Educated Patient;Spouse   Methods Explanation;Demonstration   Comprehension Verbalized understanding          SLP Short Term Goals - 07/19/16 0849      SLP SHORT TERM GOAL #1   Title Pt will utilize external aids/memory book to be oriented to date, appointments and daily chores with occasional min A over 3 sessions   Time 2   Period Weeks   Status On-going     SLP SHORT TERM GOAL #2   Title Pt/family will report use of external aids/memory book at home for ADL's and medication mangement over 3 sessions   Time 2   Period Weeks   Status On-going     SLP SHORT TERM GOAL #3   Title Pt will verbalize 3 cognitive impairments with mod A   Time 2   Period Weeks     SLP SHORT TERM  GOAL #4   Title Pt will attend to simple cognitive linguistic tasks in distracting enviroment for 10 mintues with occasional min A   Time 2   Period Weeks   Status On-going          SLP Long Term Goals - 07/19/16 0849      SLP LONG TERM GOAL #1   Title Pt/family will report use of external memory aids at home for successful f/u on daily schedule, safety precautions, chores, meds, etc over 5 sessions.   Time 6   Period Weeks   Status On-going     SLP LONG TERM GOAL #2   Title Pt will verbalize 3 safety precautions due to cognitive impairments with mod A.    Time 6   Period Weeks   Status On-going     SLP LONG TERM GOAL #3   Title Pt will solve simple reasoning, organization, time and money  problems with 75% accuracy and occasional min A   Time 6   Period Weeks   Status On-going     SLP LONG TERM GOAL #4   Title Pt will demonstrate emergent awareness by using memory compensation during therapy session x2 sessions   Time 6   Period Weeks   Status On-going          Plan - 07/19/16 0846    Clinical Impression Statement Pt cont to present with significant cognitive-communication deficits. Pt did indicate her injury (CVA) but always with "I guess", indicating reduced intellectual awareness, when asked why she is having this difficulty. Sustained/selective attention with verbal and visual cues needed, but cues faded from consistent to usual by task end. Pt/family forgot memory notebook today, adn were 15 minutes late to appointment. Continue skilled ST to maximize cognition to reduce caregivers burden.   Speech Therapy Frequency 2x / week   Treatment/Interventions Compensatory strategies;Patient/family education;Functional tasks;Cueing hierarchy;Internal/external aids;Cognitive reorganization;SLP instruction and feedback   Potential to Achieve Goals Fair   Potential Considerations Severity of impairments;Previous level of function   Consulted and Agree with Plan of Care Patient;Family member/caregiver   Family Member Consulted daughter      Patient will benefit from skilled therapeutic intervention in order to improve the following deficits and impairments:   Cognitive communication deficit    Problem List Patient Active Problem List   Diagnosis Date Noted  . Seizure-like activity (HCC) 06/28/2016  . Cerebral infarction due to unspecified mechanism   . Gait difficulty   . Memory loss 05/22/2016  . Confusion   . Acute ischemic stroke (HCC) 05/18/2016  . Morbid obesity (HCC) 09/30/2015  . Uncontrolled type 2 diabetes mellitus with circulatory disorder, without long-term current use of insulin (HCC)   . Essential hypertension   . Cerebrovascular accident (CVA) due to  thrombosis of cerebral artery (HCC)   . CVA (cerebral infarction) 08/07/2015  . Pure hypercholesterolemia 10/04/2009    [facility] ,MS, CCC-SLP  07/19/2016, 8:50 AM  [facility] [address] [place], [place], [address] Phone: [phone]   Fax:  [phone]   Name: Annette Munoz MRN: [id number] Date of Birth: 12-01-1965

## 2016-07-20 NOTE — Therapy (Signed)
[facility] [address] [place], [name], [address] Phone: [phone]   Fax:  [phone]  Physical Therapy Treatment  Patient Details  Name: Annette SanerBetty H Munoz MRN: [id number] Date of Birth: 06/16/1966 Referring Provider: Marvel PlanXu, Jindong  Encounter Date: 07/19/2016      PT End of Session - 07/19/16 0933    Visit Number 8   Number of Visits 19   Date for PT Re-Evaluation 08/25/16   Authorization Type [employer] 60 visit limit combined   PT Start Time 0932   PT Stop Time 1015   PT Time Calculation (min) 43 min   Equipment Utilized During Treatment Gait belt   Activity Tolerance Patient tolerated treatment well   Behavior During Therapy Flat affect      Past Medical History:  Diagnosis Date  . Diabetes mellitus without complication (HCC)   . Hypertension   . Stroke [facility](HCC)     Past Surgical History:  Procedure Laterality Date  . CESAREAN SECTION    . TEE WITHOUT CARDIOVERSION N/A 08/10/2015   Procedure: TRANSESOPHAGEAL ECHOCARDIOGRAM (TEE);  Surgeon: Chrystie NoseKenneth C Hilty, MD;  [facility];  Service: Cardiovascular;  Laterality: N/A;    There were no vitals filed for this visit.      Subjective Assessment - 07/19/16 0933    Subjective No new complaints. No falls to report. No pain per pt report.   Patient is accompained by: Family member   Pertinent History HTN, DM, CVA (x 2), with recent TIA per family report; weakness on R side new since 06/18/16   Patient Stated Goals Pt's goals for therapy are to "do everything I need to do."   Currently in Pain? No/denies   Pain Score 0-No pain            [facility] Adult PT Treatment/Exercise - 07/19/16 0953      Transfers   Transfers Sit to Stand;Stand to Sit   Sit to Stand 4: Min guard;With upper extremity assist;4: Min assist;From bed   Sit to Stand Details Verbal cues for technique;Verbal cues for precautions/safety;Verbal cues for safe use of DME/AE   Sit to Stand  Details (indicate cue type and reason) cues for hand placment only today   Stand to Sit 4: Min guard;4: Min assist;With upper extremity assist;To bed   Stand to Sit Details (indicate cue type and reason) Verbal cues for technique;Verbal cues for precautions/safety;Verbal cues for safe use of DME/AE   Stand to Sit Details cues to turn all the way to surface and reach back when sitting down     Ambulation/Gait   Ambulation/Gait Yes   Ambulation/Gait Assistance 4: Min guard;4: Min assist   Ambulation/Gait Assistance Details foot clearance improved significantly with use of brace with only occasional toe scuffing noted. this improved with addition of simulated toe cap with second gait trial. Pt with occasional right knee genurecurvatum, increased incidence when she fatigued. May benefit from a small heel wedge- will wait to fully access when pt has her own shoes and actual toe cap added to them. min assist at times for weight shifting onto right leg in stance                                   Ambulation Distance (Feet) 115 Feet  x1, 230 x 2   Assistive device Rolling walker  right bluerocker AFO, simulated toe cap to left shoe   Gait  Pattern Step-through pattern;Decreased stride length;Decreased step length - right;Decreased step length - left;Decreased stance time - right;Decreased weight shift to right;Right genu recurvatum;Narrow base of support;Poor foot clearance - right   Ambulation Surface Level;Indoor           Balance Exercises - 07/19/16 1004      Balance Exercises: Standing   SLS with Vectors Solid surface;Limitations     Balance Exercises: Standing   SLS with Vectors Limitations on floor with 2 tall cones: alternating fwd toe taps to cones and alternating cross toe taps to cones x 10 reps each bil LE's with no UE support to HHA for balance, min to mod assist. cues for base of support, weight shifitng and to slow down for improved stance control and balance with lifting LE to tap.                                  PT Short Term Goals - 06/26/16 1252      PT SHORT TERM GOAL #1   Title Pt/family will verbalize/demo understanding of HEP for strength, balance, transfers, and gait.  TARGET 07/25/16   Time 4   Period Weeks   Status New     PT SHORT TERM GOAL #2   Title Pt will perform sit<>stand transfers with correct sequence, with supervision, 8 of 10 trials, for improved safety and efficiency with transfers.   Time 4   Period Weeks   Status New     PT SHORT TERM GOAL #3   Title Pt will improve TUG score to less than or equal to 28 seconds for decreased fall risk.   Time 4   Period Weeks   Status New     PT SHORT TERM GOAL #4   Title Pt will negotiate at least 4 steps using 1 handrail, with min asistance, for improved safety with home stair negotiation.   Time 4   Period Weeks   Status New     PT SHORT TERM GOAL #5   Title Pt will improve Berg score by at least 5 points (>11/56) for decreased fall risk.   Time 4   Period Weeks   Status New           PT Long Term Goals - 06/26/16 1256      PT LONG TERM GOAL #1   Title Pt/family will verbalize understanding of fall prevention within the home environment.  TARGET 08/25/16   Time 8   Period Weeks   Status New     PT LONG TERM GOAL #2   Title Pt will improve gait velocity to at least 1.3 ft/sec for improved household gait.   Time 8   Period Weeks   Status New     PT LONG TERM GOAL #3   Title Pt will improve TUG score to less than or equal to 20 seconds for decreased fall risk.   Time 8   Period Weeks     PT LONG TERM GOAL #4   Title Pt will improve Berg Balance score to at least 21/56 for decreased fall risk.   Time 8   Period Weeks   Status New     PT LONG TERM GOAL #5   Title Pt will ambulate at least 500 ft, indoor/outdoor surfaces with supervision, for improved household and community ambulation.   Time 8   Period Weeks   Status New  Additional Long Term Goals   Additional  Long Term Goals Yes     PT LONG TERM GOAL #6   Title Pt will negotiate at least 4 steps, 1 rail, with supervision of family, for improved safety with stair negotiation into/out of the home.   Time 8   Period Weeks   Status New            Plan - 07/19/16 0933    Clinical Impression Statement during today's session pt's new AFO was delivered by orthotist. Pt and daughter educated in donning/doffing, wear schedule and appropriate shoes to wear with it. Daughter plans to go purchase pt new shoes today vs tomorrow to accomodate brace (pt wearing slip on loafers today, used clinic sneakers to trial brace). No skin issues noted after use with entire session. Pt's gait pattern improved with stepping control and foot clearance until fatigued. At that time applied simulated toe cap with improvement noted. Daughter to schedule with Thayer Ohm at [employer] once she get's pt's sneakers to have toe cap applied. Pt with occaisonal knee recurvatum noted with gait, mostly with fatigue and when she increases step length for step through pattern. May need to try heel wedge if this continues/does not improve. Pt is making steady progress and was please with how brace helped her with gait, "it doesn't hurt anymore" was stated several times, along with "this is so much better/different". Pt continues to need cues for safety due to memory/cognition, howeve ris progressing toward goals. Pt should benefit from continued PT to progress toward unmet goals.                                 Rehab Potential Fair   Clinical Impairments Affecting Rehab Potential Cognition (pt does have family support of husband and daughter)   PT Frequency 2x / week   PT Duration Other (comment)  9 weeks; plus eval   PT Treatment/Interventions ADLs/Self Care Home Management;Functional mobility training;Stair training;Gait training;DME Instruction;Therapeutic activities;Therapeutic exercise;Balance training;Neuromuscular re-education;Patient/family  education;Orthotic Fit/Training   PT Next Visit Plan lower extremity strengthening, transfers, gait with new right AFO, including barriers, outside surfaces   Consulted and Agree with Plan of Care Patient;Family member/caregiver   Family Member Consulted husband and daughter      Patient will benefit from skilled therapeutic intervention in order to improve the following deficits and impairments:  Abnormal gait, Decreased balance, Decreased cognition, Decreased mobility, Decreased knowledge of use of DME, Decreased safety awareness, Decreased strength, Difficulty walking, Postural dysfunction, Impaired tone, Increased fascial restricitons  Visit Diagnosis: Other abnormalities of gait and mobility  Unsteadiness on feet  Muscle weakness (generalized)     Problem List Patient Active Problem List   Diagnosis Date Noted  . Seizure-like activity (HCC) 06/28/2016  . Cerebral infarction due to unspecified mechanism   . Gait difficulty   . Memory loss 05/22/2016  . Confusion   . Acute ischemic stroke (HCC) 05/18/2016  . Morbid obesity (HCC) 09/30/2015  . Uncontrolled type 2 diabetes mellitus with circulatory disorder, without long-term current use of insulin (HCC)   . Essential hypertension   . Cerebrovascular accident (CVA) due to thrombosis of cerebral artery (HCC)   . CVA (cerebral infarction) 08/07/2015  . Pure hypercholesterolemia 10/04/2009    Sallyanne Kuster, PTA, [facility] [address] [place], [place] [address] [phone] 07/20/16, 11:55 AM   Name: ALEKSA COLLINSWORTH MRN: [id number] Date of Birth:  12/15/1965    

## 2016-07-21 ENCOUNTER — Ambulatory Visit: Payer: Commercial Managed Care - HMO

## 2016-07-21 DIAGNOSIS — R41841 Cognitive communication deficit: Secondary | ICD-10-CM

## 2016-07-21 DIAGNOSIS — M6281 Muscle weakness (generalized): Secondary | ICD-10-CM | POA: Diagnosis not present

## 2016-07-21 NOTE — Therapy (Signed)
[facility] [address] [place], [name], [address] Phone: [phone]   Fax:  [phone]  Speech Language Pathology Treatment  Patient Details  Name: Annette SanerBetty H Munoz MRN: [id number] Date of Birth: 03/16/1966 Referring Provider: Dr. Renaye RakersVeita Bland  Encounter Date: 07/21/2016      End of Session - 07/21/16 1334    Visit Number 6   Number of Visits 17   Date for SLP Re-Evaluation 09/08/16   SLP Start Time 1150   SLP Stop Time  1230   SLP Time Calculation (min) 40 min   Activity Tolerance Patient tolerated treatment well      Past Medical History:  Diagnosis Date  . Diabetes mellitus without complication (HCC)   . Hypertension   . Stroke [facility](HCC)     Past Surgical History:  Procedure Laterality Date  . CESAREAN SECTION    . TEE WITHOUT CARDIOVERSION N/A 08/10/2015   Procedure: TRANSESOPHAGEAL ECHOCARDIOGRAM (TEE);  Surgeon: Chrystie NoseKenneth C Hilty, MD;  [facility];  Service: Cardiovascular;  Laterality: N/A;    There were no vitals filed for this visit.      Subjective Assessment - 07/21/16 1157    Subjective "Do I need to look at that (notebook) every day?"   Patient is accompained by: Renee Harder--  [name]   Currently in Pain? No/denies               ADULT SLP TREATMENT - 07/21/16 1203      General Information   Behavior/Cognition Alert;Cooperative;Distractible;Decreased sustained attention     Treatment Provided   Treatment provided Cognitive-Linquistic     Cognitive-Linquistic Treatment   Treatment focused on Cognition   Skilled Treatment SLP facilitated pt's cognitive skills with organization and attention task with 15 pictures. With inital mod verbal and demo cues fading to rare min nonverbal cues for organization and rare min verbal cues for attention to task, pt had 100% success with min extra time.  SLP used the names from these pictures to have pt categorize words - pt req'd min-mod verbal  cues initially faded to independence with last 4 words. Educated daughter on home tasks that would benefit pt's attention skills.  Pt could not spontaneously tell SLP what her memory notebook was for, but told SLP with direct questions and by looking through notebook.     Assessment / Recommendations / Plan   Plan Continue with current plan of care     Progression Toward Goals   Progression toward goals Progressing toward goals            SLP Short Term Goals - 07/21/16 1335      SLP SHORT TERM GOAL #1   Title Pt will utilize external aids/memory book to be oriented to date, appointments and daily chores with occasional min A over 3 sessions   Time 2   Period Weeks   Status On-going     SLP SHORT TERM GOAL #2   Title Pt/family will report use of external aids/memory book at home for ADL's and medication mangement over 3 sessions   Baseline one session 07-21-16   Time 2   Period Weeks   Status On-going     SLP SHORT TERM GOAL #3   Title Pt will verbalize 3 cognitive impairments with mod A   Time 2   Period Weeks   Status On-going     SLP SHORT TERM GOAL #4   Title Pt will attend to simple cognitive linguistic tasks in distracting  enviroment for 10 mintues with occasional min A   Time 2   Period Weeks   Status On-going          SLP Long Term Goals - 07/21/16 1335      SLP LONG TERM GOAL #1   Title Pt/family will report use of external memory aids at home for successful f/u on daily schedule, safety precautions, chores, meds, etc over 5 sessions.   Time 6   Period Weeks   Status On-going     SLP LONG TERM GOAL #2   Title Pt will verbalize 3 safety precautions due to cognitive impairments with mod A.    Time 6   Period Weeks   Status On-going     SLP LONG TERM GOAL #3   Title Pt will solve simple reasoning, organization, time and money problems with 75% accuracy and occasional min A   Time 6   Period Weeks   Status On-going     SLP LONG TERM GOAL #4    Title Pt will demonstrate emergent awareness by using memory compensation during therapy session x2 sessions   Time 6   Period Weeks   Status On-going          Plan - 07/21/16 1334    Clinical Impression Statement Pt cont to present with significant cognitive-communication deficits. Pt did indicate her injury (CVA) but always with "I guess", indicating reduced intellectual awareness, when asked why she is having this difficulty. Sustained/selective attention with verbal and visual cues needed, but cues faded from consistent to usual by task end. Pt/family forgot memory notebook today, adn were 15 minutes late to appointment. Continue skilled ST to maximize cognition to reduce caregivers burden.   Speech Therapy Frequency 2x / week   Treatment/Interventions Compensatory strategies;Patient/family education;Functional tasks;Cueing hierarchy;Internal/external aids;Cognitive reorganization;SLP instruction and feedback   Potential to Achieve Goals Fair   Potential Considerations Severity of impairments;Previous level of function   Consulted and Agree with Plan of Care Patient;Family member/caregiver   Family Member Consulted daughter      Patient will benefit from skilled therapeutic intervention in order to improve the following deficits and impairments:   Cognitive communication deficit    Problem List Patient Active Problem List   Diagnosis Date Noted  . Seizure-like activity (HCC) 06/28/2016  . Cerebral infarction due to unspecified mechanism   . Gait difficulty   . Memory loss 05/22/2016  . Confusion   . Acute ischemic stroke (HCC) 05/18/2016  . Morbid obesity (HCC) 09/30/2015  . Uncontrolled type 2 diabetes mellitus with circulatory disorder, without long-term current use of insulin (HCC)   . Essential hypertension   . Cerebrovascular accident (CVA) due to thrombosis of cerebral artery (HCC)   . CVA (cerebral infarction) 08/07/2015  . Pure hypercholesterolemia 10/04/2009     [facility] ,MS, CCC-SLP  07/21/2016, 1:38 PM  [facility] [address] [place], [place], [address] Phone: [phone]   Fax:  [phone]   Name: NIKESHA KWASNY MRN: [id number] Date of Birth: 1965-11-13

## 2016-07-25 ENCOUNTER — Ambulatory Visit: Payer: Commercial Managed Care - HMO | Admitting: Physical Therapy

## 2016-07-25 ENCOUNTER — Ambulatory Visit: Payer: Commercial Managed Care - HMO | Admitting: Occupational Therapy

## 2016-07-25 DIAGNOSIS — M6281 Muscle weakness (generalized): Secondary | ICD-10-CM | POA: Diagnosis not present

## 2016-07-25 DIAGNOSIS — R2689 Other abnormalities of gait and mobility: Secondary | ICD-10-CM

## 2016-07-25 DIAGNOSIS — R278 Other lack of coordination: Secondary | ICD-10-CM

## 2016-07-25 DIAGNOSIS — R2681 Unsteadiness on feet: Secondary | ICD-10-CM

## 2016-07-25 DIAGNOSIS — I69315 Cognitive social or emotional deficit following cerebral infarction: Secondary | ICD-10-CM

## 2016-07-25 NOTE — Therapy (Signed)
[place] [address] [place], [name], [address] Phone: [phone]   Fax:  [phone]  Physical Therapy Treatment  Patient Details  Name: Annette Munoz MRN: [id number] Date of Birth: 1965-11-01 Referring Provider: Rosalin Hawking  Encounter Date: 07/25/2016      PT End of Session - 07/25/16 2347    Visit Number 9   Number of Visits 19   Date for PT Re-Evaluation 08/25/16   Authorization Type [employer] 60 visit limit combined   PT Start Time 0935   PT Stop Time 1018   PT Time Calculation (min) 43 min   Equipment Utilized During Treatment Gait belt   Activity Tolerance Patient tolerated treatment well   Behavior During Therapy Flat affect      Past Medical History:  Diagnosis Date  . Diabetes mellitus without complication ([place])   . Hypertension   . Stroke [facility])     Past Surgical History:  Procedure Laterality Date  . CESAREAN SECTION    . TEE WITHOUT CARDIOVERSION N/A 08/10/2015   Procedure: TRANSESOPHAGEAL ECHOCARDIOGRAM (TEE);  Surgeon: [name], MD;  Location: [facility] ENDOSCOPY;  Service: Cardiovascular;  Laterality: N/A;    There were no vitals filed for this visit.      Subjective Assessment - 07/25/16 0935    Subjective No changes, no falls.  Reports new AFO is going well.  Pt does ask if she should have on the brace all the time.   Patient is accompained by: Family member   Pertinent History HTN, DM, CVA (x 2), with recent TIA per family report; weakness on R side new since 06/18/16   Patient Stated Goals Pt's goals for therapy are to "do everything I need to do."   Currently in Pain? No/denies                         [facility] Adult PT Treatment/Exercise - 07/25/16 0943      Transfers   Transfers Sit to Stand;Stand to Sit   Sit to Stand 5: Supervision;With upper extremity assist;From chair/3-in-1   Stand to Sit 5: Supervision;With upper extremity assist;To chair/3-in-1   Number of  Reps 10 reps   Comments Occasional cues for correct hand placement     Ambulation/Gait   Ambulation/Gait Yes   Ambulation/Gait Assistance 4: Min guard;4: Min assist   Ambulation/Gait Assistance Details Short distance gait in PT session today, in gym area, with patient wearing R AFO.  Pt's overall gait pattern significantly improved with use of R AFO   Ambulation Distance (Feet) 20 Feet  -25 ft, several reps between chair/table, mat, stairs   Assistive device --  Right bluerocker AFO   Gait Pattern Step-through pattern;Decreased stride length;Decreased step length - right;Decreased step length - left;Decreased stance time - right;Decreased weight shift to right;Right genu recurvatum;Narrow base of support;Poor foot clearance - right   Ambulation Surface Level;Indoor   Stairs Yes   Stairs Assistance 4: Min guard;5: Supervision   Stairs Assistance Details (indicate cue type and reason) Verbal cues given each step for correct foot placement and sequence   Stair Management Technique One rail Left;Alternating pattern;Step to pattern  Alternating pattern up; step-to pattern down   Number of Stairs 4   Height of Stairs 6   Gait Comments Discussed gait/gait safety related questions from patient and daughter.  Daughter questions when patient can use lesser device other than RW.  Discussed Berg score (improved from eval, but continues  to be at signficant fall risk and walker continues to be safer choice.  Daughter and pt question if she needs to wear AFO during the day, wondering if a knee brace is enough so patient can walk barefoot (as her preference).  PT discussed importance of wearing AFO during waking hours for improved gait stability; educated pt/daughter on need to wear shoes due to diabetes.  Also discussed how knee brace is not likely to provide enough support for gait.      Standardized Balance Assessment   Standardized Balance Assessment Timed Up and Go Test;Berg Balance Test     Berg  Balance Test   Sit to Stand Able to stand  independently using hands   Standing Unsupported Able to stand 2 minutes with supervision   Sitting with Back Unsupported but Feet Supported on Floor or Stool Able to sit safely and securely 2 minutes   Stand to Sit Controls descent by using hands   Transfers Able to transfer with verbal cueing and /or supervision   Standing Unsupported with Eyes Closed Able to stand 10 seconds with supervision   Standing Ubsupported with Feet Together Needs help to attain position but able to stand for 30 seconds with feet together   From Standing, Reach Forward with Outstretched Arm Reaches forward but needs supervision   From Standing Position, Pick up Object from Floor Unable to try/needs assist to keep balance   From Standing Position, Turn to Look Behind Over each Shoulder Looks behind from both sides and weight shifts well   Turn 360 Degrees Needs close supervision or verbal cueing   Standing Unsupported, Alternately Place Feet on Step/Stool Needs assistance to keep from falling or unable to try   Standing Unsupported, One Foot in Front Able to take small step independently and hold 30 seconds   Standing on One Leg Unable to try or needs assist to prevent fall   Total Score 27     Timed Up and Go Test   TUG Normal TUG   Normal TUG (seconds) 36.21  31.03, 29.89 sec     Self-Care   Self-Care --       Therapeutic Exercise: Reviewed pt's HEP-pt needs cues to slow pace of SAQ and SLR.  Daughter able to verbalize understanding of how to assist patient with exercises as part of HEP.  (Pt performs LAQ, SAQ, SLR, hooklying hip adduction, hip abduction, hip internal rotation in HEP review)          PT Education - 07/25/16 2345    Education provided Yes   Education Details Safety with gait using RW; need to use R AFO during waking hours   Person(s) Educated Patient;Child(ren)   Methods Explanation   Comprehension Verbalized understanding;Returned  demonstration;Verbal cues required          PT Short Term Goals - 07/25/16 2347      PT SHORT TERM GOAL #1   Title Pt/family will verbalize/demo understanding of HEP for strength, balance, transfers, and gait.  TARGET 07/25/16   Baseline Pt requires assistance of daughter for HEP-daughter verbalizes understanding   Time 4   Period Weeks   Status Achieved     PT SHORT TERM GOAL #2   Title Pt will perform sit<>stand transfers with correct sequence, with supervision, 8 of 10 trials, for improved safety and efficiency with transfers.   Time 4   Period Weeks   Status Achieved     PT SHORT TERM GOAL #3   Title Pt will  improve TUG score to less than or equal to 28 seconds for decreased fall risk.   Baseline 29.89 sec at best 07/25/16   Time 4   Period Weeks   Status Not Met     PT SHORT TERM GOAL #4   Title Pt will negotiate at least 4 steps using 1 handrail, with min asistance, for improved safety with home stair negotiation.   Time 4   Period Weeks   Status Achieved     PT SHORT TERM GOAL #5   Title Pt will improve Berg score by at least 5 points (>11/56) for decreased fall risk.   Baseline 27/56 07/25/16   Time 4   Period Weeks   Status Achieved           PT Long Term Goals - 07/25/16 2355      PT LONG TERM GOAL #1   Title Pt/family will verbalize understanding of fall prevention within the home environment.  TARGET 08/25/16   Time 8   Period Weeks   Status New     PT LONG TERM GOAL #2   Title Pt will improve gait velocity to at least 1.3 ft/sec for improved household gait.   Time 8   Period Weeks   Status New     PT LONG TERM GOAL #3   Title Pt will improve TUG score to less than or equal to 20 seconds for decreased fall risk.   Time 8   Period Weeks     PT LONG TERM GOAL #4   Title Pt will improve Berg Balance score to at least 21/56 for decreased fall risk.-MOdified to > 32/56 (07/25/16-AWM)   Time 8   Period Weeks   Status New     PT LONG TERM  GOAL #5   Title Pt will ambulate at least 500 ft, indoor/outdoor surfaces with supervision, for improved household and community ambulation.   Time 8   Period Weeks   Status New     PT LONG TERM GOAL #6   Title Pt will negotiate at least 4 steps, 1 rail, with supervision of family, for improved safety with stair negotiation into/out of the home.   Time 8   Period Weeks   Status New               Plan - 07/25/16 2349    Clinical Impression Statement Treatment session focused on short term goal check today.  Pt has met STG #1,2, 4, and 5.  Pt has demonstrated improvements in gait and balance; however, pt continues to be at fall risk and needs close supervision for safety with gait.  Note updated Berg LTG.  Pt will continue to benefit from skileld physical therapy to improve functional mobility and decrease fall risk.   Rehab Potential Fair   Clinical Impairments Affecting Rehab Potential Cognition (pt does have family support of husband and daughter)   PT Frequency 2x / week   PT Duration Other (comment)  9 weeks; plus eval   PT Treatment/Interventions ADLs/Self Care Home Management;Functional mobility training;Stair training;Gait training;DME Instruction;Therapeutic activities;Therapeutic exercise;Balance training;Neuromuscular re-education;Patient/family education;Orthotic Fit/Training   PT Next Visit Plan Update HEP with possible addition of weights/theraband; standing balance and strengthening.  Gait training with new blue rocker AFO.   Consulted and Agree with Plan of Care Patient;Family member/caregiver   Family Member Consulted daughter      Patient will benefit from skilled therapeutic intervention in order to improve the following deficits and impairments:  Abnormal  gait, Decreased balance, Decreased cognition, Decreased mobility, Decreased knowledge of use of DME, Decreased safety awareness, Decreased strength, Difficulty walking, Postural dysfunction, Impaired tone,  Increased fascial restricitons  Visit Diagnosis: Other abnormalities of gait and mobility  Unsteadiness on feet  Muscle weakness (generalized)     Problem List Patient Active Problem List   Diagnosis Date Noted  . Seizure-like activity ([place]) 06/28/2016  . Cerebral infarction due to unspecified mechanism   . Gait difficulty   . Memory loss 05/22/2016  . Confusion   . Acute ischemic stroke ([place]) 05/18/2016  . Morbid obesity ([place]) 09/30/2015  . Uncontrolled type 2 diabetes mellitus with circulatory disorder, without long-term current use of insulin ([place])   . Essential hypertension   . Cerebrovascular accident (CVA) due to thrombosis of cerebral artery ([place])   . CVA (cerebral infarction) 08/07/2015  . Pure hypercholesterolemia 10/04/2009    Tametria Aho W. 07/25/2016, 11:58 PM  Frazier Butt., PT  [place] [address] [place], [name], [address] Phone: [phone]   Fax:  [phone]  Name: Annette Munoz MRN: [id number] Date of Birth: September 14, 1966

## 2016-07-25 NOTE — Therapy (Signed)
[facility] [address] [place], [place], [address] Phone: [phone]   Fax:  [phone]  Occupational Therapy Treatment  Patient Details  Name: Annette Munoz MRN: [id number] Date of Birth: [birth year] Referring Provider: Zenia Resides. Thedore Mins  Encounter Date: 07/25/2016      OT End of Session - 07/25/16 0903    Visit Number 7   Number of Visits 16   Date for OT Re-Evaluation 08/22/16   Authorization Type [employer] 60 Visit Limit Combined   Authorization - Visit Number 7   Authorization - Number of Visits 16   OT Start Time [phone]   OT Stop Time 0930   OT Time Calculation (min) 32 min   Activity Tolerance Patient tolerated treatment well   Behavior During Therapy Flat affect;WFL for tasks assessed/performed      Past Medical History:  Diagnosis Date  . Diabetes mellitus without complication (HCC)   . Hypertension   . Stroke [facility])     Past Surgical History:  Procedure Laterality Date  . CESAREAN SECTION    . TEE WITHOUT CARDIOVERSION N/A 08/10/2015   Procedure: TRANSESOPHAGEAL ECHOCARDIOGRAM (TEE);  Surgeon: Chrystie Nose, MD;  Location: [facility] ENDOSCOPY;  Service: Cardiovascular;  Laterality: N/A;    There were no vitals filed for this visit.      Subjective Assessment - 07/25/16 0902    Subjective  Pt reports that she is doing good    Patient is accompained by: Family member   Currently in Pain? No/denies       Copied small peg design (mod complex).  Pt cued to use R hand for incr coordination (min difficulty/incr time) and copied with min-mod cueing for copying accurately and min cues to use RUE.   Practiced writing name/address with 95+% legibility with min cueing for size x2 trials.  Then practiced copying sentences with approx 95+% legibility.                       OT Education - 07/25/16 [phone]    Education Details Reviewed [place] and Coordination  HEP   Person(s) Educated Patient;Child(ren)   Methods Explanation;Demonstration   Comprehension Verbalized understanding;Returned demonstration;Verbal cues required  min cueing           OT Short Term Goals - 07/25/16 0917      OT SHORT TERM GOAL #1   Title Pt will be Mod I signs and symtpoms of CVA/TIA   Status Deferred     OT SHORT TERM GOAL #2   Title Pt will be Mod I home program RUE  supervision and mod cueing   Status Achieved  07/25/16 supervision and min cueing     OT SHORT TERM GOAL #3   Title Pt/family will I state 2-3 pieces of a/e &/or DME for home use for increased independnece and safety   Status On-going     OT SHORT TERM GOAL #4   Title Patient will dress lower body with mod cueing and intermittent mod assistance following set up.   Time 4   Period Weeks   Status Achieved  07/17/16:  min-mod A per dtr report     OT SHORT TERM GOAL #5   Title Patient will dress upper body with set up and minimal assistance   Time 4   Period Weeks   Status Achieved  07/17/16: per dtr report           OT Long Term  Goals - 06/27/16 1221      OT LONG TERM GOAL #1   Title Pt will be Min A LB/UB Dressing and ADL's   Baseline Mod-max A   Time 8   Period Weeks   Status New     OT LONG TERM GOAL #2   Title Pt will be Mod I-supervision updated HEP RUE   Time 8   Period Weeks   Status New     OT LONG TERM GOAL #3   Title Pt will demonstrate improved RUE strength as seen by 3+/5 MMT   Baseline -3/5 RUE   Time 8   Period Weeks   Status New     OT LONG TERM GOAL #4   Title Pt will demonstrate improved R hand coordination and grip strength as seen by improved Box & Blocks to 30 or greater and grip 30# or greater R.   Baseline Grip 19#; Box and Blocks = 13 (06/27/16)   Time 8   Period Weeks   Status New     OT LONG TERM GOAL #5   Title Pt will demonstrate improved functional mobility and balance as seen by supervision assist for simulated shower transfers    Baseline Moderate (06/27/16)   Time 8   Period Weeks   Status New               Plan - 07/25/16 0904    Clinical Impression Statement Pt continues to progress towards goals with improving attention and less cueing needed for RUE use.     Rehab Potential Fair   Clinical Impairments Affecting Rehab Potential severity of deficits; cognitive deficits   OT Frequency 2x / week   OT Duration 8 weeks   OT Treatment/Interventions Self-care/ADL training;Therapeutic exercise;DME and/or AE instruction;Therapeutic activities;Patient/family education;Balance training;Neuromuscular education;Energy conservation;Passive range of motion;Therapeutic exercises;Visual/perceptual remediation/compensation   Plan check remaining STGs, CVA ed.   OT Home Exercise Plan 07/10/16:  cane and simple coordination HEP   Consulted and Agree with Plan of Care Patient;Family member/caregiver   Family Member Consulted Husband Mosetta Putt, daughter      Patient will benefit from skilled therapeutic intervention in order to improve the following deficits and impairments:  Decreased balance, Decreased endurance, Impaired vision/preception, Decreased range of motion, Decreased knowledge of precautions, Decreased cognition, Decreased activity tolerance, Decreased coordination, Decreased knowledge of use of DME, Decreased safety awareness, Decreased strength, Impaired flexibility, Impaired UE functional use, Decreased mobility, Difficulty walking  Visit Diagnosis: Cognitive social or emotional deficit following cerebral infarction  Other lack of coordination  Muscle weakness (generalized)  Unsteadiness on feet    Problem List Patient Active Problem List   Diagnosis Date Noted  . Seizure-like activity (HCC) 06/28/2016  . Cerebral infarction due to unspecified mechanism   . Gait difficulty   . Memory loss 05/22/2016  . Confusion   . Acute ischemic stroke (HCC) 05/18/2016  . Morbid obesity (HCC) 09/30/2015  .  Uncontrolled type 2 diabetes mellitus with circulatory disorder, without long-term current use of insulin (HCC)   . Essential hypertension   . Cerebrovascular accident (CVA) due to thrombosis of cerebral artery (HCC)   . CVA (cerebral infarction) 08/07/2015  . Pure hypercholesterolemia 10/04/2009    [facility] 07/25/2016, 10:53 AM  [facility] [address] [place], [place], [address] Phone: [phone]   Fax:  [phone]  Name: ALISIA VANENGEN MRN: [id number] Date of Birth: [birth year]   Willa Frater, OTR/L [facility] [address]  [address] [place], [name]  [address] [phone] phone [phone] 07/25/16 10:53 AM

## 2016-07-27 ENCOUNTER — Ambulatory Visit: Payer: Commercial Managed Care - HMO | Admitting: *Deleted

## 2016-07-27 ENCOUNTER — Encounter: Payer: Self-pay | Admitting: Physical Therapy

## 2016-07-27 ENCOUNTER — Encounter: Payer: Self-pay | Admitting: *Deleted

## 2016-07-27 ENCOUNTER — Ambulatory Visit: Payer: Commercial Managed Care - HMO | Admitting: Physical Therapy

## 2016-07-27 DIAGNOSIS — M6281 Muscle weakness (generalized): Secondary | ICD-10-CM | POA: Diagnosis not present

## 2016-07-27 DIAGNOSIS — R278 Other lack of coordination: Secondary | ICD-10-CM

## 2016-07-27 DIAGNOSIS — I69315 Cognitive social or emotional deficit following cerebral infarction: Secondary | ICD-10-CM

## 2016-07-27 DIAGNOSIS — R2681 Unsteadiness on feet: Secondary | ICD-10-CM

## 2016-07-27 DIAGNOSIS — R2689 Other abnormalities of gait and mobility: Secondary | ICD-10-CM

## 2016-07-27 DIAGNOSIS — IMO0002 Reserved for concepts with insufficient information to code with codable children: Secondary | ICD-10-CM

## 2016-07-27 NOTE — Therapy (Signed)
[place] [address] [place], [name], [address] Phone: [phone]   Fax:  [phone]  Physical Therapy Treatment  Patient Details  Name: Annette Munoz MRN: [id number] Date of Birth: 1965/11/06 Referring Provider: Rosalin Hawking  Encounter Date: 07/27/2016      PT End of Session - 07/27/16 1017    Visit Number 10   Number of Visits 19   Date for PT Re-Evaluation 08/25/16   Authorization Type [employer] 60 visit limit combined   PT Start Time 0935   PT Stop Time 1015   PT Time Calculation (min) 40 min   Equipment Utilized During Treatment Gait belt   Activity Tolerance Patient tolerated treatment well   Behavior During Therapy Flat affect      Past Medical History:  Diagnosis Date  . Diabetes mellitus without complication ([place])   . Hypertension   . Stroke [facility])     Past Surgical History:  Procedure Laterality Date  . CESAREAN SECTION    . TEE WITHOUT CARDIOVERSION N/A 08/10/2015   Procedure: TRANSESOPHAGEAL ECHOCARDIOGRAM (TEE);  Surgeon: [name], MD;  Location[facility] ENDOSCOPY;  Service: Cardiovascular;  Laterality: N/A;    There were no vitals filed for this visit.      Subjective Assessment - 07/27/16 0943    Subjective No changes, no falls. " This is work," pt report during Nustep activity.   Currently in Pain? No/denies                         [facility] Adult PT Treatment/Exercise - 07/27/16 0001      Ambulation/Gait   Ambulation/Gait Yes   Ambulation/Gait Assistance 4: Min guard;4: Min assist   Ambulation/Gait Assistance Details cues for [facility] placement ( had quad tip)   Ambulation Distance (Feet) 60 Feet  + 115   Assistive device Straight cane   Gait Pattern Step-through pattern;Decreased stride length;Decreased step length - right;Decreased step length - left;Decreased stance time - right;Decreased weight shift to right;Right genu recurvatum;Narrow base of support;Poor foot  clearance - right   Ambulation Surface Level;Indoor     Knee/Hip Exercises: Aerobic   Nustep Level 5, 10 min ,  44 steps per minute; with multiple small stops and cues to stay on task.     Knee/Hip Exercises: Standing   Hip ADduction Strengthening;Both;2 sets;10 reps             Balance Exercises - 07/27/16 0953      Balance Exercises: Standing   SLS Eyes open;Upper extremity support 1  multidirectional tapping cones, Min A   Stepping Strategy Anterior;Lateral;UE support  With SPC, min guard             PT Short Term Goals - 07/25/16 2347      PT SHORT TERM GOAL #1   Title Pt/family will verbalize/demo understanding of HEP for strength, balance, transfers, and gait.  TARGET 07/25/16   Baseline Pt requires assistance of daughter for HEP-daughter verbalizes understanding   Time 4   Period Weeks   Status Achieved     PT SHORT TERM GOAL #2   Title Pt will perform sit<>stand transfers with correct sequence, with supervision, 8 of 10 trials, for improved safety and efficiency with transfers.   Time 4   Period Weeks   Status Achieved     PT SHORT TERM GOAL #3   Title Pt will improve TUG score to less than or equal to 28 seconds  for decreased fall risk.   Baseline 29.89 sec at best 07/25/16   Time 4   Period Weeks   Status Not Met     PT SHORT TERM GOAL #4   Title Pt will negotiate at least 4 steps using 1 handrail, with min asistance, for improved safety with home stair negotiation.   Time 4   Period Weeks   Status Achieved     PT SHORT TERM GOAL #5   Title Pt will improve Berg score by at least 5 points (>11/56) for decreased fall risk.   Baseline 27/56 07/25/16   Time 4   Period Weeks   Status Achieved           PT Long Term Goals - 07/25/16 2355      PT LONG TERM GOAL #1   Title Pt/family will verbalize understanding of fall prevention within the home environment.  TARGET 08/25/16   Time 8   Period Weeks   Status New     PT LONG TERM GOAL #2    Title Pt will improve gait velocity to at least 1.3 ft/sec for improved household gait.   Time 8   Period Weeks   Status New     PT LONG TERM GOAL #3   Title Pt will improve TUG score to less than or equal to 20 seconds for decreased fall risk.   Time 8   Period Weeks     PT LONG TERM GOAL #4   Title Pt will improve Berg Balance score to at least 21/56 for decreased fall risk.-MOdified to > 32/56 (07/25/16-AWM)   Time 8   Period Weeks   Status New     PT LONG TERM GOAL #5   Title Pt will ambulate at least 500 ft, indoor/outdoor surfaces with supervision, for improved household and community ambulation.   Time 8   Period Weeks   Status New     PT LONG TERM GOAL #6   Title Pt will negotiate at least 4 steps, 1 rail, with supervision of family, for improved safety with stair negotiation into/out of the home.   Time 8   Period Weeks   Status New               Plan - 07/27/16 1004    Clinical Impression Statement Pt was able to perform SLS with SPC and min A and progressed gait with SPC and Right AFO donned at min A level.   Rehab Potential Fair   Clinical Impairments Affecting Rehab Potential Cognition (pt does have family support of husband and daughter)   PT Frequency 2x / week   PT Duration Other (comment)  9 weeks; plus eval   PT Treatment/Interventions ADLs/Self Care Home Management;Functional mobility training;Stair training;Gait training;DME Instruction;Therapeutic activities;Therapeutic exercise;Balance training;Neuromuscular re-education;Patient/family education;Orthotic Fit/Training   PT Next Visit Plan Update HEP with possible addition of weights/theraband; standing balance and strengthening.  Gait training with new blue rocker AFO.   Consulted and Agree with Plan of Care Patient;Family member/caregiver   Family Member Consulted daughter      Patient will benefit from skilled therapeutic intervention in order to improve the following deficits and  impairments:  Abnormal gait, Decreased balance, Decreased cognition, Decreased mobility, Decreased knowledge of use of DME, Decreased safety awareness, Decreased strength, Difficulty walking, Postural dysfunction, Impaired tone, Increased fascial restricitons  Visit Diagnosis: Muscle weakness (generalized)  Unsteadiness on feet  Other abnormalities of gait and mobility     Problem List Patient Active  Problem List   Diagnosis Date Noted  . Seizure-like activity ([place]) 06/28/2016  . Cerebral infarction due to unspecified mechanism   . Gait difficulty   . Memory loss 05/22/2016  . Confusion   . Acute ischemic stroke ([place]) 05/18/2016  . Morbid obesity ([place]) 09/30/2015  . Uncontrolled type 2 diabetes mellitus with circulatory disorder, without long-term current use of insulin ([place])   . Essential hypertension   . Cerebrovascular accident (CVA) due to thrombosis of cerebral artery ([place])   . CVA (cerebral infarction) 08/07/2015  . Pure hypercholesterolemia 10/04/2009    Bjorn Loser 07/27/2016, 10:17 AM  [facility] [address] [place], [name], [address] Phone: [phone]   Fax:  [phone]  Name: Annette Munoz MRN: [id number] Date of Birth: Jun 01, 1966

## 2016-07-27 NOTE — Therapy (Signed)
[facility] [address] [place], [place], [address] Phone: [phone]   Fax:  [phone]  Occupational Therapy Treatment  Patient Details  Name: DANESSA MENSCH MRN: [id number] Date of Birth: 1966/06/19 Referring Provider: Zenia Resides. Thedore Mins  Encounter Date: 07/27/2016      OT End of Session - 07/27/16 1301    Visit Number 8   Number of Visits 16   Date for OT Re-Evaluation 08/22/16   Authorization Type [employer] 60 Visit Limit Combined   Authorization - Visit Number 7   Authorization - Number of Visits 16   OT Start Time 0854   OT Stop Time 0933   OT Time Calculation (min) 39 min   Activity Tolerance Patient tolerated treatment well   Behavior During Therapy Flat affect      Past Medical History:  Diagnosis Date  . Diabetes mellitus without complication ([facility])   . Hypertension   . Stroke [facility])     Past Surgical History:  Procedure Laterality Date  . CESAREAN SECTION    . TEE WITHOUT CARDIOVERSION N/A 08/10/2015   Procedure: TRANSESOPHAGEAL ECHOCARDIOGRAM (TEE);  Surgeon: Chrystie Nose, MD;  Location: [facility] ENDOSCOPY;  Service: Cardiovascular;  Laterality: N/A;    There were no vitals filed for this visit.      Subjective Assessment - 07/27/16 0900    Subjective  Pt denies pain and reports that she is doing well. Pt used bathroom at start of therapy session.   Patient is accompained by: Family member   Currently in Pain? No/denies            [facility] OT Assessment - 07/27/16 0001      Hand Function   Right Hand Grip (lbs) 24#                  OT Treatments/Exercises (OP) - 07/27/16 0001      ADLs   Toileting Pt used bathroom at start of therapy session today at Mod I level w/ RW   ADL Comments Handouts issued and reviewed with pt and daughter today for CVA prevention strategies. Cognition appears to be limiting factor with carryover.     Cognitive  Exercises   Other Cognitive Exercises 1 Pt daughter asked "What can we do to help her not ask the same questions all day?" Reviewed memory book, calendar use and suggest placing daily schedule in page protector and fill out each day with dry erase marker. When activities are completed or appointments, HEP completed, have pt cross off list (do not erase so pt can look back and see that activity has been completed). Erase and make new schedule each day. Also suggest leaving sticky notes as reminders around house PRN.     Exercises   Exercises Hand     Hand Exercises   Theraputty - Grip Grip red putty RUE x10 reps   Theraputty - Pinch Pinch - 3-point RUE x10 reps     Neurological Re-education Exercises   Other Exercises 1 Neuromuscular re-ed RUE seated and standing to place graded clothes pins on vertical & horizontal surfaces. Pt req vc's for step by step instruction and completetion of activity.             Balance Exercises - 07/27/16 0953      Balance Exercises: Standing   SLS Eyes open;Upper extremity support 1  multidirectional tapping cones, Min A   Stepping Strategy Anterior;Lateral;UE support  With SPC, min guard  OT Education - 07/27/16 1300    Education provided Yes   Education Details Review HEP, added red putty for grip and pinch strengthening; memory and cognitive strategies.   Person(s) Educated Patient;Child(ren)   Methods Explanation;Demonstration;Verbal cues;Handout   Comprehension Verbalized understanding;Returned demonstration;Verbal cues required          OT Short Term Goals - 07/27/16 1304      OT SHORT TERM GOAL #1   Title Pt will be Mod I signs and symtpoms of CVA/TIA   Time 4   Period Weeks   Status On-going           OT Long Term Goals - 06/27/16 1221      OT LONG TERM GOAL #1   Title Pt will be Min A LB/UB Dressing and ADL's   Baseline Mod-max A   Time 8   Period Weeks   Status New     OT LONG TERM GOAL #2   Title Pt  will be Mod I-supervision updated HEP RUE   Time 8   Period Weeks   Status New     OT LONG TERM GOAL #3   Title Pt will demonstrate improved RUE strength as seen by 3+/5 MMT   Baseline -3/5 RUE   Time 8   Period Weeks   Status New     OT LONG TERM GOAL #4   Title Pt will demonstrate improved R hand coordination and grip strength as seen by improved Box & Blocks to 30 or greater and grip 30# or greater R.   Baseline Grip 19#; Box and Blocks = 13 (06/27/16)   Time 8   Period Weeks   Status New     OT LONG TERM GOAL #5   Title Pt will demonstrate improved functional mobility and balance as seen by supervision assist for simulated shower transfers   Baseline Moderate (06/27/16)   Time 8   Period Weeks   Status New               Plan - 07/27/16 1302    Clinical Impression Statement Pt/family should benefit from memory and cognitive strategies as discussed today in clinic as well as putty for grip and pinch strengthening. Focus on LTG's.   Rehab Potential Fair   Clinical Impairments Affecting Rehab Potential severity of deficits; cognitive deficits   OT Frequency 2x / week   OT Duration 8 weeks   OT Treatment/Interventions Self-care/ADL training;Therapeutic exercise;DME and/or AE instruction;Therapeutic activities;Patient/family education;Balance training;Neuromuscular education;Energy conservation;Passive range of motion;Therapeutic exercises;Visual/perceptual remediation/compensation   Plan Focus on LTG's, self care and cognition.   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 Seizure-like activity (H[facility]) 06/28/2016  . Cerebral infarction due to unspecified mechanism   . Gait difficulty   . Memory loss 05/22/2016  . Confusion   . Acute ischemic stroke (HCC) 05/18/2016  . Morbid obesity (HCC) 09/30/2015  . Uncontrolled type 2 diabetes mellitus with circulatory disorder, without long-term current use of insulin (HCC)   . Essential hypertension   . Cerebrovascular accident (CVA) due  to thrombosis of cerebral artery (HCC)   . CVA (cerebral infarction) 08/07/2015  . Pure hypercholesterolemia 10/04/2009    Mariam Dollar Beth Dixon, OTR/L 07/27/2016, 1:07 PM  [facility] [address] [place], [place], [address] Phone: [phone]   Fax:  [phone]  Name: SAKINAH ROSAMOND MRN: [id number] Date of Birth: [birth year]

## 2016-07-27 NOTE — Patient Instructions (Addendum)
Stroke Prevention Some health problems and behaviors may make it more likely for you to have a stroke. Below are ways to lessen your risk of having a stroke.   Be active for at least 30 minutes on most or all days.  Do not smoke. Try not to be around others who smoke.  Do not drink too much alcohol.  Do not have more than 2 drinks a day if you are a man.  Do not have more than 1 drink a day if you are a woman and are not pregnant.  Eat healthy foods, such as fruits and vegetables. If you were put on a specific diet, follow the diet as told.  Keep your cholesterol levels under control through diet and medicines. Look for foods that are low in saturated fat, trans fat, cholesterol, and are high in fiber.  If you have diabetes, follow all diet plans and take your medicine as told.  Ask your doctor if you need treatment to lower your blood pressure. If you have high blood pressure (hypertension), follow all diet plans and take your medicine as told by your doctor.  If you are 5018-49 years old, have your blood pressure checked every 3-5 years. If you are age 340 or older, have your blood pressure checked every year.  Keep a healthy weight. Eat foods that are low in calories, salt, saturated fat, trans fat, and cholesterol.  Do not take drugs.  Avoid birth control pills, if this applies. Talk to your doctor about the risks of taking birth control pills.  Talk to your doctor if you have sleep problems (sleep apnea).  Take all medicine as told by your doctor.  You may be told to take aspirin or blood thinner medicine. Take this medicine as told by your doctor.  Understand your medicine instructions.  Make sure any other conditions you have are being taken care of. GET HELP RIGHT AWAY IF:  You suddenly lose feeling (you feel numb) or have weakness in your face, arm, or leg.  Your face or eyelid hangs down to one side.  You suddenly feel confused.  You have trouble talking  (aphasia) or understanding what people are saying.  You suddenly have trouble seeing in one or both eyes.  You suddenly have trouble walking.  You are dizzy.  You lose your balance or your movements are clumsy (uncoordinated).  You suddenly have a very bad headache and you do not know the cause.  You have new chest pain.  Your heart feels like it is fluttering or skipping a beat (irregular heartbeat). Do not wait to see if the symptoms above go away. Get help right away. Call your local emergency services (911 in [place] Do not drive yourself to the hospital.   This information is not intended to replace advice given to you by your health care provider. Make sure you discuss any questions you have with your health care provider.   Document Released: 04/02/2012 Document Revised: 10/23/2014 Document Reviewed: 04/04/2013 Elsevier Interactive Patient Education 2016 Elsevier Inc.  Flexion (Resistive Putty)    Keeping fingertips straight, press putty towards base of palm. Repeat __10_ times. Do  1-2_ sessions per day.  Pinch: Three Jaw Chuck    Pull using left thumb, index and middle fingers. Repeat __10__ times. Do _1-2__ sessions per day. Activity: Use tongs for picking up. Spin or wind up toys.  You can roll the putty out on the table and pinch it together. .Marland Kitchen

## 2016-07-28 ENCOUNTER — Ambulatory Visit: Payer: Commercial Managed Care - HMO

## 2016-07-28 DIAGNOSIS — M6281 Muscle weakness (generalized): Secondary | ICD-10-CM | POA: Diagnosis not present

## 2016-07-28 DIAGNOSIS — R41841 Cognitive communication deficit: Secondary | ICD-10-CM

## 2016-07-28 NOTE — Patient Instructions (Signed)
  Please complete the assigned speech therapy homework and return it to your next session.  

## 2016-07-28 NOTE — Therapy (Signed)
[place] [address] [place], [name], [address] Phone: [phone]   Fax:  [phone]  Speech Language Pathology Treatment  Patient Details  Name: Annette Munoz MRN: [id number] Date of Birth: June 12, 1966 Referring Provider: Dr. Lucianne Lei  Encounter Date: 07/28/2016      End of Session - 07/28/16 0939    Visit Number 7   Number of Visits 17   Date for SLP Re-Evaluation 09/08/16   SLP Start Time 0853  pt 7 minutes late   SLP Stop Time  0930   SLP Time Calculation (min) 37 min   Activity Tolerance Patient tolerated treatment well      Past Medical History:  Diagnosis Date  . Diabetes mellitus without complication ([place])   . Hypertension   . Stroke [facility])     Past Surgical History:  Procedure Laterality Date  . CESAREAN SECTION    . TEE WITHOUT CARDIOVERSION N/A 08/10/2015   Procedure: TRANSESOPHAGEAL ECHOCARDIOGRAM (TEE);  Surgeon: [name], MD;  Location: [facility] ENDOSCOPY;  Service: Cardiovascular;  Laterality: N/A;    There were no vitals filed for this visit.      Subjective Assessment - 07/28/16 0857    Subjective Pt arrived 7 minutes late.   Patient is accompained by: Family member  daughter   Currently in Pain? No/denies               ADULT SLP TREATMENT - 07/28/16 0858      General Information   Behavior/Cognition Alert;Cooperative;Distractible;Decreased sustained attention;Requires cueing;Impulsive     Treatment Provided   Treatment provided Cognitive-Linquistic     Cognitive-Linquistic Treatment   Treatment focused on Cognition   Skilled Treatment "What was this? Work from last week?" (pt, re: her binder) SLP facilitated practice with pt's attention and orientation/awareness, by reviewing her memory notebook with her. Pt was surprised about not being able to drive and necessarychanges in her diet until SLP and daughter cued her with usual min verbal cues. SLP then had pt  look at her phone and read news story (after daughter shared pt looked at her phone yesterday and read news, for the first time since last CVA). Pt attended to her phone for 30-45 seconds with occasional min verbal questioning cues from SLP. SLP used this opportunity to teach daughter how to cue pt during this task and encouraged this type of task for pt at home. SLP also educated pt's daughter how to incr complexity of sustained attention work/practice at home.     Assessment / Recommendations / Plan   Plan Continue with current plan of care     Progression Toward Goals   Progression toward goals Progressing toward goals          SLP Education - 07/28/16 0939    Education provided Yes   Education Details home tasks, cueing with home tasks   Person(s) Educated Patient;Child(ren)   Methods Explanation;Demonstration   Comprehension Verbalized understanding          SLP Short Term Goals - 07/28/16 0942      SLP SHORT TERM GOAL #1   Title Pt will utilize external aids/memory book to be oriented to date, appointments and daily chores with occasional min A over 3 sessions   Time 1   Period Weeks   Status Not Met     SLP SHORT TERM GOAL #2   Title Pt/family will report use of external aids/memory book at home for ADL's and medication mangement over  3 sessions   Baseline one session 07-21-16, 07-28-16   Time 1   Period Weeks   Status Partially Met     SLP SHORT TERM GOAL #3   Title Pt will verbalize 3 cognitive impairments with mod A   Time 1   Period Weeks   Status Not Met     SLP SHORT TERM GOAL #4   Title Pt will attend to simple cognitive linguistic tasks in distracting enviroment for 10 mintues with occasional min A   Time 1   Period Weeks   Status Not Met          SLP Long Term Goals - 07/28/16 1324      SLP LONG TERM GOAL #1   Title Pt/family will report use of external memory aids at home for successful f/u on daily schedule, safety precautions, chores, meds,  etc over 5 sessions.   Time 5   Period Weeks   Status On-going     SLP LONG TERM GOAL #2   Title Pt will verbalize 3 safety precautions due to cognitive impairments with mod A.    Time 5   Period Weeks   Status On-going     SLP LONG TERM GOAL #3   Title Pt will solve simple reasoning, organization, time and money problems with 75% accuracy and occasional min A   Time 5   Period Weeks   Status On-going     SLP LONG TERM GOAL #4   Title Pt will demonstrate emergent awareness by using memory compensation during therapy session x2 sessions   Time 5   Period Weeks   Status On-going          Plan - 07/28/16 0940    Clinical Impression Statement Pt cont to present with significant cognitive-communication deficits. Pt did indicate her injury (CVA) when asked why she had to change her diet, improving intellectual awareness. Sustained/selective attention with verbal and visual cues needed. Pt/family forgot memory notebook today, adn were 15 minutes late to appointment. Continue skilled ST to maximize cognition to reduce caregivers burden.   Speech Therapy Frequency 2x / week   Treatment/Interventions Compensatory strategies;Patient/family education;Functional tasks;Cueing hierarchy;Internal/external aids;Cognitive reorganization;SLP instruction and feedback   Potential to Achieve Goals Fair   Potential Considerations Severity of impairments;Previous level of function   Consulted and Agree with Plan of Care Patient;Family member/caregiver   Family Member Consulted daughter      Patient will benefit from skilled therapeutic intervention in order to improve the following deficits and impairments:   Cognitive communication deficit    Problem List Patient Active Problem List   Diagnosis Date Noted  . Seizure-like activity ([place]) 06/28/2016  . Cerebral infarction due to unspecified mechanism   . Gait difficulty   . Memory loss 05/22/2016  . Confusion   . Acute ischemic stroke ([place])  05/18/2016  . Morbid obesity ([place]) 09/30/2015  . Uncontrolled type 2 diabetes mellitus with circulatory disorder, without long-term current use of insulin ([place])   . Essential hypertension   . Cerebrovascular accident (CVA) due to thrombosis of cerebral artery ([place])   . CVA (cerebral infarction) 08/07/2015  . Pure hypercholesterolemia 10/04/2009    [facility] ,MS, CCC-SLP  07/28/2016, 9:43 AM  [facility] [address] [place], [name], [address] Phone: [phone]   Fax:  [phone]   Name: Annette Munoz MRN: [id number] Date of Birth: 01-21-1966

## 2016-08-01 ENCOUNTER — Ambulatory Visit: Payer: Commercial Managed Care - HMO | Admitting: Physical Therapy

## 2016-08-01 ENCOUNTER — Ambulatory Visit: Payer: Commercial Managed Care - HMO

## 2016-08-01 ENCOUNTER — Ambulatory Visit: Payer: Commercial Managed Care - HMO | Admitting: Occupational Therapy

## 2016-08-01 ENCOUNTER — Encounter: Payer: Self-pay | Admitting: Physical Therapy

## 2016-08-01 DIAGNOSIS — R278 Other lack of coordination: Secondary | ICD-10-CM

## 2016-08-01 DIAGNOSIS — R41841 Cognitive communication deficit: Secondary | ICD-10-CM

## 2016-08-01 DIAGNOSIS — R2681 Unsteadiness on feet: Secondary | ICD-10-CM

## 2016-08-01 DIAGNOSIS — M6281 Muscle weakness (generalized): Secondary | ICD-10-CM

## 2016-08-01 DIAGNOSIS — I69315 Cognitive social or emotional deficit following cerebral infarction: Secondary | ICD-10-CM

## 2016-08-01 DIAGNOSIS — R2689 Other abnormalities of gait and mobility: Secondary | ICD-10-CM

## 2016-08-01 NOTE — Therapy (Signed)
[facility] [address] [place], [name], [address] Phone: [phone]   Fax:  [phone]  Occupational Therapy Treatment  Patient Details  Name: Annette Munoz MRN: [id number] Date of Birth: 09/08/1966 Referring Provider: Zenia ResidesSr Prashant K. Thedore MinsSingh  Encounter Date: 08/01/2016      OT End of Session - 08/01/16 0916    Visit Number 9   Number of Visits 16   Date for OT Re-Evaluation 08/22/16   Authorization Type [employer] H[employer] 60 Visit Limit Combined   Authorization - Visit Number 9   Authorization - Number of Visits 16   OT Start Time [phone]   OT Stop Time 0930   OT Time Calculation (min) 39 min   Activity Tolerance Patient tolerated treatment well   Behavior During Therapy Flat affect      Past Medical History:  Diagnosis Date  . Diabetes mellitus without complication (HCC)   . Hypertension   . Stroke [facility](HCC)     Past Surgical History:  Procedure Laterality Date  . CESAREAN SECTION    . TEE WITHOUT CARDIOVERSION N/A 08/10/2015   Procedure: TRANSESOPHAGEAL ECHOCARDIOGRAM (TEE);  Surgeon: Chrystie NoseKenneth C Hilty, MD;  [facility];  Service: Cardiovascular;  Laterality: N/A;    There were no vitals filed for this visit.      Subjective Assessment - 08/01/16 0915    Currently in Pain? No/denies             Treatment Completing a 24 piece puzzle, with mod/ max v.c. For organization, and attention. Pt required increased time grossly 25 mins to complete. Pt required v.c. To incorporate RUE. Arm bike x 6 mins level 3 for conditioning. Pt required v.c. To stay on task  Placing grooved pegs in pegboard with RUE for increased fine motor coordination, and right side awareness, min difficulty.                   OT Short Term Goals - 07/27/16 1304      OT SHORT TERM GOAL #1   Title Pt will be Mod I signs and symtpoms of CVA/TIA   Time 4   Period Weeks   Status  On-going           OT Long Term Goals - 06/27/16 1221      OT LONG TERM GOAL #1   Title Pt will be Min A LB/UB Dressing and ADL's   Baseline Mod-max A   Time 8   Period Weeks   Status New     OT LONG TERM GOAL #2   Title Pt will be Mod I-supervision updated HEP RUE   Time 8   Period Weeks   Status New     OT LONG TERM GOAL #3   Title Pt will demonstrate improved RUE strength as seen by 3+/5 MMT   Baseline -3/5 RUE   Time 8   Period Weeks   Status New     OT LONG TERM GOAL #4   Title Pt will demonstrate improved R hand coordination and grip strength as seen by improved Box & Blocks to 30 or greater and grip 30# or greater R.   Baseline Grip 19#; Box and Blocks = 13 (06/27/16)   Time 8   Period Weeks   Status New     OT LONG TERM GOAL #5   Title Pt will demonstrate improved functional mobility and balance as seen by supervision assist for simulated shower transfers  Baseline Moderate (06/27/16)   Time 8   Period Weeks   Status New               Plan - 08/01/16 0921    Clinical Impression Statement Pt is progressing towards goals. She is limited by cognitive deficts and right inattention.   Rehab Potential Fair   Clinical Impairments Affecting Rehab Potential severity of deficits; cognitive deficits   OT Frequency 2x / week   OT Duration 8 weeks   OT Treatment/Interventions Self-care/ADL training;Therapeutic exercise;DME and/or AE instruction;Therapeutic activities;Patient/family education;Balance training;Neuromuscular education;Energy conservation;Passive range of motion;Therapeutic exercises;Visual/perceptual remediation/compensation   Plan progress towards LTG's   OT Home Exercise Plan 07/10/16:  cane and simple coordination HEP   Consulted and Agree with Plan of Care Family member/caregiver   Family Member Consulted Daughter      Patient will benefit from skilled therapeutic intervention in order to improve the following deficits and impairments:   Decreased balance, Decreased endurance, Impaired vision/preception, Decreased range of motion, Decreased knowledge of precautions, Decreased cognition, Decreased activity tolerance, Decreased coordination, Decreased knowledge of use of DME, Decreased safety awareness, Decreased strength, Impaired flexibility, Impaired UE functional use, Decreased mobility, Difficulty walking  Visit Diagnosis: Muscle weakness (generalized)  Cognitive social or emotional deficit following cerebral infarction  Other lack of coordination    Problem List Patient Active Problem List   Diagnosis Date Noted  . Seizure-like activity (HCC) 06/28/2016  . Cerebral infarction due to unspecified mechanism   . Gait difficulty   . Memory loss 05/22/2016  . Confusion   . Acute ischemic stroke (HCC) 05/18/2016  . Morbid obesity (HCC) 09/30/2015  . Uncontrolled type 2 diabetes mellitus with circulatory disorder, without long-term current use of insulin (HCC)   . Essential hypertension   . Cerebrovascular accident (CVA) due to thrombosis of cerebral artery (HCC)   . CVA (cerebral infarction) 08/07/2015  . Pure hypercholesterolemia 10/04/2009    RINE,KATHRYN 08/01/2016, 9:22 AM  [facility] [address] [place], [place], [address] Phone: [phone]   Fax:  [phone]  Name: Annette Munoz MRN: [id number] Date of Birth: 1966/02/15

## 2016-08-01 NOTE — Therapy (Signed)
[facility] [address] [place], [name], [address] Phone: [phone]   Fax:  [phone]  Speech Language Pathology Treatment  Patient Details  Name: Annette Munoz MRN: [id number] Date of Birth: Jan 24, 1966 Referring Provider: Dr. Lucianne Lei  Encounter Date: 08/01/2016      End of Session - 08/01/16 1653    Visit Number 8   Number of Visits 17   Date for SLP Re-Evaluation 09/08/16   SLP Start Time 0933   SLP Stop Time  0762   SLP Time Calculation (min) 42 min   Activity Tolerance Patient tolerated treatment well      Past Medical History:  Diagnosis Date  . Diabetes mellitus without complication ([place])   . Hypertension   . Stroke [facility])     Past Surgical History:  Procedure Laterality Date  . CESAREAN SECTION    . TEE WITHOUT CARDIOVERSION N/A 08/10/2015   Procedure: TRANSESOPHAGEAL ECHOCARDIOGRAM (TEE);  Surgeon: [name], MD;  Location: [facility] ENDOSCOPY;  Service: Cardiovascular;  Laterality: N/A;    There were no vitals filed for this visit.      Subjective Assessment - 08/01/16 0936    Subjective Arrived with daughter today to [place].   Patient is accompained by: --  daughter   Currently in Pain? No/denies               ADULT SLP TREATMENT - 08/01/16 0937      General Information   Behavior/Cognition Alert;Cooperative;Distractible;Decreased sustained attention;Requires cueing;Impulsive     Treatment Provided   Treatment provided Cognitive-Linquistic     Cognitive-Linquistic Treatment   Treatment focused on Cognition   Skilled Treatment Pt unable to name any deficits without SLP verbal cues consistently. SLP asked pt about recent personal events for memory compensation work. Pt req'd cues from daughter for approx 40% of events. SLP suggested a "journal" for daily events to pt's daughter. SLP gave pt 10 cards in two categories (concrete) and had her separate them - pt req'd total A to  find simple difference between them (decr'd reasoning). Pt looked in her binder for her homework when SLP asked for it.     Assessment / Recommendations / Plan   Plan Continue with current plan of care     Progression Toward Goals   Progression toward goals Progressing toward goals          SLP Education - 08/01/16 1653    Education provided Yes   Education Details memory journal for daily events   Person(s) Educated Patient;Child(ren)   Methods Explanation;Demonstration   Comprehension Verbalized understanding          SLP Short Term Goals - 07/28/16 0942      SLP SHORT TERM GOAL #1   Title Pt will utilize external aids/memory book to be oriented to date, appointments and daily chores with occasional min A over 3 sessions   Time 1   Period Weeks   Status Not Met     SLP SHORT TERM GOAL #2   Title Pt/family will report use of external aids/memory book at home for ADL's and medication mangement over 3 sessions   Baseline one session 07-21-16, 07-28-16   Time 1   Period Weeks   Status Partially Met     SLP SHORT TERM GOAL #3   Title Pt will verbalize 3 cognitive impairments with mod A   Time 1   Period Weeks   Status Not Met     SLP  SHORT TERM GOAL #4   Title Pt will attend to simple cognitive linguistic tasks in distracting enviroment for 10 mintues with occasional min A   Time 1   Period Weeks   Status Not Met          SLP Long Term Goals - 08/01/16 1656      SLP LONG TERM GOAL #1   Title Pt/family will report use of external memory aids at home with rare min cues, for successful f/u on daily schedule, safety precautions, chores, meds, etc over 3 sessions.   Time 4   Period Weeks   Status Revised     SLP LONG TERM GOAL #2   Title Pt will verbalize 3 safety precautions due to cognitive impairments with mod A.    Time 4   Period Weeks   Status On-going     SLP LONG TERM GOAL #3   Title Pt will solve simple reasoning, organization, time and money  problems with 75% accuracy and occasional min A   Time 4   Period Weeks   Status On-going     SLP LONG TERM GOAL #4   Title Pt will demonstrate emergent awareness by using memory compensation during therapy session x2 sessions   Time 4   Period Weeks   Status On-going          Plan - 08/01/16 1654    Clinical Impression Statement Pt cont to present with significant cognitive-communication deficits. Pt did not indicate any knowledge of deficits (decr'd intellectual awareness). Continue skilled ST to maximize cognition to reduce caregivers burden.   Speech Therapy Frequency 2x / week   Treatment/Interventions Compensatory strategies;Patient/family education;Functional tasks;Cueing hierarchy;Internal/external aids;Cognitive reorganization;SLP instruction and feedback   Potential to Achieve Goals Fair   Potential Considerations Severity of impairments;Previous level of function   Consulted and Agree with Plan of Care Patient;Family member/caregiver   Family Member Consulted daughter      Patient will benefit from skilled therapeutic intervention in order to improve the following deficits and impairments:   Cognitive communication deficit    Problem List Patient Active Problem List   Diagnosis Date Noted  . Seizure-like activity ([place]) 06/28/2016  . Cerebral infarction due to unspecified mechanism   . Gait difficulty   . Memory loss 05/22/2016  . Confusion   . Acute ischemic stroke ([place]) 05/18/2016  . Morbid obesity ([place]) 09/30/2015  . Uncontrolled type 2 diabetes mellitus with circulatory disorder, without long-term current use of insulin ([place])   . Essential hypertension   . Cerebrovascular accident (CVA) due to thrombosis of cerebral artery ([place])   . CVA (cerebral infarction) 08/07/2015  . Pure hypercholesterolemia 10/04/2009    [facility] ,MS, CCC-SLP  08/01/2016, 4:58 PM  [facility] [address] [place], [name], [address] Phone: [phone]   Fax:  [phone]   Name: HIKARI TRIPP MRN: [id number] Date of Birth: Jun 10, 1966

## 2016-08-01 NOTE — Patient Instructions (Signed)
  Please complete the assigned speech therapy homework and return it to your next session.  

## 2016-08-01 NOTE — Patient Instructions (Addendum)
Perform all of these in the corner with a chair in front of [place] for safety, standing next to her as well in case she looses her balance:  Feet Together (Compliant Surface) Varied Arm Positions - Eyes Closed    Stand on compliant surface: pillow/s_ with feet together and arms out. Close eyes and visualize upright position. Hold__20__ seconds. Repeat _3___ times per session. Do __1-2__ sessions per day.  Copyright  VHI. All rights reserved.  Feet Together (Compliant Surface) Head Motion - Eyes Closed    Stand on compliant surface: _pillow/s_ with feet together. Close eyes and move head slowly: 1. up and down x 10 reps 2. Left and right x 10 reps Do _1-2_ sessions per day.  Copyright  VHI. All rights reserved.

## 2016-08-02 NOTE — Therapy (Signed)
[facility] [address] [place], [name], [address] Phone: [phone]   Fax:  [phone]  Physical Therapy Treatment  Patient Details  Name: Annette Munoz MRN: [id number] Date of Birth: [birth year] Referring Provider: Rosalin Hawking  Encounter Date: 08/01/2016      PT End of Session - 08/01/16 1023    Visit Number 11   Number of Visits 19   Date for PT Re-Evaluation 08/25/16   Authorization Type [employer] 60 visit limit combined   PT Start Time 1018   PT Stop Time 1100   PT Time Calculation (min) 42 min   Equipment Utilized During Treatment Gait belt   Activity Tolerance Patient tolerated treatment well   Behavior During Therapy Flat affect      Past Medical History:  Diagnosis Date  . Diabetes mellitus without complication ([place])   . Hypertension   . Stroke [facility])     Past Surgical History:  Procedure Laterality Date  . CESAREAN SECTION    . TEE WITHOUT CARDIOVERSION N/A 08/10/2015   Procedure: TRANSESOPHAGEAL ECHOCARDIOGRAM (TEE);  Surgeon: [name], MD;  Location: [facility] ENDOSCOPY;  Service: Cardiovascular;  Laterality: N/A;    There were no vitals filed for this visit.      Subjective Assessment - 08/01/16 1022    Subjective No new complaints. No pain or falls to report.   Patient is accompained by: Family member  daughter   Pertinent History HTN, DM, CVA (x 2), with recent TIA per family report; weakness on R side new since 06/18/16   Patient Stated Goals Pt's goals for therapy are to "do everything I need to do."   Currently in Pain? No/denies   Pain Score 0-No pain           [facility] Adult PT Treatment/Exercise - 08/01/16 1024      Transfers   Transfers Sit to Stand;Stand to Sit   Sit to Stand 5: Supervision;With upper extremity assist;From bed   Stand to Sit 5: Supervision;With upper extremity assist;To bed     Ambulation/Gait   Ambulation/Gait Yes   Ambulation/Gait Assistance 4: Min  assist;3: Mod assist   Ambulation/Gait Assistance Details cues on posture, weight shifting, to keep cane in contact with ground with gait and for right foot clearnace with swing phase   Ambulation Distance (Feet) 230 Feet  x1 with cane   Assistive device Straight cane  with rubber quad tip    Gait Pattern Step-through pattern;Decreased stride length;Decreased step length - right;Decreased step length - left;Decreased stance time - right;Decreased weight shift to right;Right genu recurvatum;Narrow base of support;Poor foot clearance - right   Ambulation Surface Level;Indoor             Balance Exercises - 08/01/16 1032      Balance Exercises: Standing   Standing Eyes Closed Narrow base of support (BOS);Wide (BOA);Head turns;Foam/compliant surface;Other reps (comment);30 secs;Limitations   SLS with Vectors Solid surface;Other reps (comment);Limitations     Balance Exercises: Standing   Standing Eyes Closed Limitations on airex without UE support:    SLS with Vectors Limitations on blue mat with 2 tall cones: alternating fwd toe taps to cones and alternating cross toe taps to cones  with no UE support, with min to mod assist. cues for base of support, weight shifitng and to slow down for improved stance control and balance with lifting LE to tap.  PT Education - 08/01/16 1058    Education provided Yes   Education Details corner balance HEP   Person(s) Educated Patient;Child(ren)   Methods Explanation;Demonstration;Handout;Verbal cues   Comprehension Verbalized understanding;Returned demonstration;Need further instruction          PT Short Term Goals - 07/25/16 2347      PT SHORT TERM GOAL #1   Title Pt/family will verbalize/demo understanding of HEP for strength, balance, transfers, and gait.  TARGET 07/25/16   Baseline Pt requires assistance of daughter for HEP-daughter verbalizes understanding   Time 4   Period Weeks   Status Achieved      PT SHORT TERM GOAL #2   Title Pt will perform sit<>stand transfers with correct sequence, with supervision, 8 of 10 trials, for improved safety and efficiency with transfers.   Time 4   Period Weeks   Status Achieved     PT SHORT TERM GOAL #3   Title Pt will improve TUG score to less than or equal to 28 seconds for decreased fall risk.   Baseline 29.89 sec at best 07/25/16   Time 4   Period Weeks   Status Not Met     PT SHORT TERM GOAL #4   Title Pt will negotiate at least 4 steps using 1 handrail, with min asistance, for improved safety with home stair negotiation.   Time 4   Period Weeks   Status Achieved     PT SHORT TERM GOAL #5   Title Pt will improve Berg score by at least 5 points (>11/56) for decreased fall risk.   Baseline 27/56 07/25/16   Time 4   Period Weeks   Status Achieved           PT Long Term Goals - 07/25/16 2355      PT LONG TERM GOAL #1   Title Pt/family will verbalize understanding of fall prevention within the home environment.  TARGET 08/25/16   Time 8   Period Weeks   Status New     PT LONG TERM GOAL #2   Title Pt will improve gait velocity to at least 1.3 ft/sec for improved household gait.   Time 8   Period Weeks   Status New     PT LONG TERM GOAL #3   Title Pt will improve TUG score to less than or equal to 20 seconds for decreased fall risk.   Time 8   Period Weeks     PT LONG TERM GOAL #4   Title Pt will improve Berg Balance score to at least 21/56 for decreased fall risk.-MOdified to > 32/56 (07/25/16-AWM)   Time 8   Period Weeks   Status New     PT LONG TERM GOAL #5   Title Pt will ambulate at least 500 ft, indoor/outdoor surfaces with supervision, for improved household and community ambulation.   Time 8   Period Weeks   Status New     PT LONG TERM GOAL #6   Title Pt will negotiate at least 4 steps, 1 rail, with supervision of family, for improved safety with stair negotiation into/out of the home.   Time 8   Period  Weeks   Status New            Plan - 08/01/16 1630    Clinical Impression Statement During today's skilled session trialed straight cane with rubber quad based tip with pt needing min to mod assist due to balance losses of at least 6 times. Reinforced  to daughter and pt that the RW is her safest device at this time. Remainder of session addressed balance with balance HEP sent home today. Pt is making slow, steady progress toward goals and should benefit from continued PT to progress toward unmet goals.                           Rehab Potential Fair   Clinical Impairments Affecting Rehab Potential Cognition (pt does have family support of husband and daughter)   PT Frequency 2x / week   PT Duration Other (comment)  9 weeks; plus eval   PT Treatment/Interventions ADLs/Self Care Home Management;Functional mobility training;Stair training;Gait training;DME Instruction;Therapeutic activities;Therapeutic exercise;Balance training;Neuromuscular re-education;Patient/family education;Orthotic Fit/Training   PT Next Visit Plan work on balance, advance balance HEP to include counter top activies as safe for pt to perform with daughter/spouse   Consulted and Agree with Plan of Care Patient;Family member/caregiver   Family Member Consulted daughter      Patient will benefit from skilled therapeutic intervention in order to improve the following deficits and impairments:  Abnormal gait, Decreased balance, Decreased cognition, Decreased mobility, Decreased knowledge of use of DME, Decreased safety awareness, Decreased strength, Difficulty walking, Postural dysfunction, Impaired tone, Increased fascial restricitons  Visit Diagnosis: Muscle weakness (generalized)  Other lack of coordination  Unsteadiness on feet  Other abnormalities of gait and mobility     Problem List Patient Active Problem List   Diagnosis Date Noted  . Seizure-like activity ([place]) 06/28/2016  . Cerebral infarction due to  unspecified mechanism   . Gait difficulty   . Memory loss 05/22/2016  . Confusion   . Acute ischemic stroke ([place]) 05/18/2016  . Morbid obesity ([place]) 09/30/2015  . Uncontrolled type 2 diabetes mellitus with circulatory disorder, without long-term current use of insulin ([place])   . Essential hypertension   . Cerebrovascular accident (CVA) due to thrombosis of cerebral artery ([place])   . CVA (cerebral infarction) 08/07/2015  . Pure hypercholesterolemia 10/04/2009    Willow Ora, PTA, [facility] [address] [place], [place] [address] [phone] 08/02/16, 2:48 PM   Name: VARNELL DONATE MRN: [id number] Date of Birth: 1966-04-07

## 2016-08-03 ENCOUNTER — Ambulatory Visit (INDEPENDENT_AMBULATORY_CARE_PROVIDER_SITE_OTHER): Payer: Commercial Managed Care - HMO | Admitting: Neurology

## 2016-08-03 ENCOUNTER — Ambulatory Visit: Payer: Commercial Managed Care - HMO | Admitting: Physical Therapy

## 2016-08-03 ENCOUNTER — Ambulatory Visit: Payer: Commercial Managed Care - HMO | Admitting: Occupational Therapy

## 2016-08-03 ENCOUNTER — Ambulatory Visit: Payer: Commercial Managed Care - HMO

## 2016-08-03 ENCOUNTER — Encounter: Payer: Self-pay | Admitting: Physical Therapy

## 2016-08-03 ENCOUNTER — Encounter: Payer: Self-pay | Admitting: Neurology

## 2016-08-03 VITALS — BP 125/70 | HR 72 | Ht 62.0 in | Wt 200.4 lb

## 2016-08-03 DIAGNOSIS — E1165 Type 2 diabetes mellitus with hyperglycemia: Secondary | ICD-10-CM

## 2016-08-03 DIAGNOSIS — R2681 Unsteadiness on feet: Secondary | ICD-10-CM

## 2016-08-03 DIAGNOSIS — M6281 Muscle weakness (generalized): Secondary | ICD-10-CM

## 2016-08-03 DIAGNOSIS — R413 Other amnesia: Secondary | ICD-10-CM | POA: Diagnosis not present

## 2016-08-03 DIAGNOSIS — I6330 Cerebral infarction due to thrombosis of unspecified cerebral artery: Secondary | ICD-10-CM

## 2016-08-03 DIAGNOSIS — R41841 Cognitive communication deficit: Secondary | ICD-10-CM

## 2016-08-03 DIAGNOSIS — I69315 Cognitive social or emotional deficit following cerebral infarction: Secondary | ICD-10-CM

## 2016-08-03 DIAGNOSIS — E7800 Pure hypercholesterolemia, unspecified: Secondary | ICD-10-CM

## 2016-08-03 DIAGNOSIS — R278 Other lack of coordination: Secondary | ICD-10-CM

## 2016-08-03 DIAGNOSIS — IMO0002 Reserved for concepts with insufficient information to code with codable children: Secondary | ICD-10-CM

## 2016-08-03 DIAGNOSIS — R2689 Other abnormalities of gait and mobility: Secondary | ICD-10-CM

## 2016-08-03 DIAGNOSIS — E1151 Type 2 diabetes mellitus with diabetic peripheral angiopathy without gangrene: Secondary | ICD-10-CM | POA: Diagnosis not present

## 2016-08-03 DIAGNOSIS — I10 Essential (primary) hypertension: Secondary | ICD-10-CM | POA: Diagnosis not present

## 2016-08-03 NOTE — Progress Notes (Signed)
STROKE NEUROLOGY FOLLOW UP NOTE  NAME: Annette Munoz DOB: 1966/04/11  REASON FOR VISIT: stroke follow up HISTORY FROM: pt and chart  Today we had the pleasure of seeing Annette Munoz in follow-up at our Neurology Clinic. Pt was accompanied by husband and daughter.   History Summary Annette Munoz is a 50 y.o. female with history of DM, HTN, obesity, HLD was admitted on 08/07/15 for AMS and amnesia. 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 MRA and CTA showed left ACA, right ACA, left M1, M2, and b/l PCA mild to moderate stenosis. Had LP and negative for vasculitis. CUS and TTE as well as TEE were unremarkable. Hypercoagulable and autoimmue work up negative. LDL 138 and A1C 7.2. Her symptoms resolved and she was discharged with ASA and lipitor.  09/30/15 follow up - the patient has been doing well. No recurrent stroke like symptoms. However, she did not check BP at home and her glucometer does not work. BP today 185/82. As per husband, she still has short term memory deficit. Pt not sure if she is on lisinopril which is on her medication list.  Follow up 12/20/15 - pt continued to have memory issues, not remember things with behavior changes since stroke. Pt seems more apathic, not compliant with medication, not refill her medication, not check BP or glucose at home and lack of activity at home. Today BP 178/86. 30 day cardiac event monitoring negative for afib. TCD MES suboptimal but negative for microemboli.   Follow-up 03/17/2016 - pt continued to be noncompliance. She did not take BP at home, nor checking glucose. Her medication ran out and has not picked up at pharmacy yet. She did not call back for diabetic education calls. She did not do much healthy diet and home exercise yet. She has not followed up with PCP. Her BP today 165/81.   Admission 05/18/2016 - patient was admitted due to acute confusion and worsening memory  loss. MRI showed right small thalamic infarct, superior than the first infarct. CUS and TTE unremarkable, LDL 83 and A1c 11.1. She had again diabetic education, and put on DAPT and continue on Lipitor 80 on discharge.  05/22/16 follow up - patient is stable, however, had profound short-term memory loss. Not able to remember 3 words given 1 minute ago. Family complains of memory loss causing dysfunction at home and at work. Occasionally lost direction during driving. BP super high today 238/130. Noncompliant diabetic diet and BP/glucose monitoring at home.  06/19/16 admission - for right hemiparesis and gait instability. MRI showed again small acute infarcts at left PLIC, still consistent with small vessel disease. MRA showed moderate proximal R VA stenosis. LDL 62 and A1C 7.2. She was continued on DAPT and lipitor on discharge with outpt PT/OT.   Interval History During the interval time, pt has been following with PT/OT and had improvement. Currently PT/OT twice a week. BP and glucose controlled well as per daughter. Lost 10lbs for the last 2 weeks. Still on aricept at 10mg . As per pt daughter, confusion is gone but still has short memory issue at home. Pt still has intermittent crying spells because she is worrying about her bills as she is not working now. BP today 125/70.   REVIEW OF SYSTEMS: Full 14 system review of systems performed and notable only for those listed below and in HPI above, all others are negative:  Constitutional:   Cardiovascular:  Ear/Nose/Throat:  Skin:  Eyes:   Respiratory:   Gastroitestinal:   Genitourinary:  Hematology/Lymphatic:   Endocrine:  Musculoskeletal:   Allergy/Immunology:   Neurological:   Psychiatric:  Sleep:   The following represents the patient's updated allergies and side effects list: Allergies  Allergen Reactions  . Glyxambi [Empagliflozin-Linagliptin] Nausea And Vomiting    Makes PT VERY SICK  . Lisinopril Cough    The neurologically  relevant items on the patient's problem list were reviewed on today's visit.  Neurologic Examination  A problem focused neurological exam (12 or more points of the single system neurologic examination, vital signs counts as 1 point, cranial nerves count for 8 points) was performed.  Weight 200 lb 6.4 oz (90.9 kg).  General - obese, well developed, in no apparent distress.  Ophthalmologic - Fundi not visualized due to eye movement.  Cardiovascular - Regular rate and rhythm.  Mental Status -  Level of arousal and orientation to time, place, and person were intact. Language including expression, naming, repetition, comprehension was assessed and found intact. Able to follow two step commands without difficulty Able to calculate and spell backward. Registration 3/3 but delayed recall 0/3.  Cranial Nerves II - XII - II - Visual field test showed right upper quadrantanopia. III, IV, VI - Extraocular movements intact. V - Facial sensation intact bilaterally. VII - Facial movement intact bilaterally. VIII - Hearing & vestibular intact bilaterally. X - Palate elevates symmetrically. XI - Chin turning & shoulder shrug intact bilaterally. XII - Tongue protrusion intact.  Motor Strength - The patient's strength was RUE 5-/5 with pronator drift and difficulty with dexterity, RLE 4+/5 with leg brace. 5/5 LUE and LLE.  Bulk was normal and fasciculations were absent.   Motor Tone - Muscle tone was assessed at the neck and appendages and was normal.  Reflexes - The patient's reflexes were 1+ in all extremities and she had no pathological reflexes.  Sensory - Light touch, temperature/pinprick were assessed and were normal.    Coordination - The patient had normal movements in the hands with no ataxia or dysmetria.  Tremor was absent.  Gait and Station - walk with walker and right hemiparetic gait.  Data reviewed: I personally reviewed the images and agree with the radiology  interpretations.  Ct Head Wo Contrast 08/07/2015  Focal low density in the left frontal lobe white matter which may be in the corpus callosum. Acute infarct is not excluded.   Mr [name] Head/brain Wo Cm 08/07/2015  1. Multiple small, acute left ACA territory infarcts involving the corpus callosum, cingulate gyrus, and superior left frontal lobe.  2. Small acute infarct involving the anteroinferior right thalamus/cerebral peduncle.  3. Moderately to severely motion degraded head MRA without evidence of large vessel occlusion or flow limiting proximal stenosis. Suggestion of A2 ACA segmental narrowing.   CTA head  08/10/2015 1. Infarcts involving the right hypothalamus, anterior genu of corpus callosum on the left, left distal ACA distribution. 2. A high-grade stenosis in proximal right A2 segment and more distal left A3 segment, within the left pericallosal artery. 3. Diffuse attenuation of distal ACA branch vessels, worse on the left. 4. Moderate proximal more mild distal left M1 segment stenoses. 5. Moderate proximal left M2 stenoses beyond the bifurcation. 6. Mild attenuation of distal MCA branch vessels bilaterally. 7. Moderate proximal PCA stenoses bilaterally with significant attenuation of distal small vessels.  Carotid Doppler 1-39% ICA plaquing. Vertebral artery flow is antegrade.  2D Echocardiogram  - Left ventricle: The cavity size  was normal. There was mildconcentric hypertrophy. Systolic function was normal. Theestimated ejection fraction was in the range of 55% to 60%. Wallmotion was normal; there were no regional wall motionabnormalities. - Left atrium: The atrium was mildly dilated. - Atrial septum: No defect or patent foramen ovale was identified.  LE venous dopplers negative for DVT  TEE  1. No LAA thrombus 2. Negative for PFO 3. No significant valvular disease 4. LVEF 55-60%  30 day cardiac monitoring - negative for afib  TCD MES - suboptimal  study, no MES seen.   Mri & Mra Brain Wo Contrast (neuro Protocol) 05/18/2016 New area of acute infarction, medial RIGHT hemisphere affecting anterior-most thalamus as well as the genu of the internal capsule. It is unclear if this involves the anterior choroidal artery territory, or medial thalamoperforate territory. MCA lenticulostriate territory involvement is unlikely. Chronic changes as described, with resolved predominantly left-sided infarcts in 2016. Similar pattern of intracranial atherosclerosis when compared with prior CTA from 08/10/2015.   Carotid Doppler  05/19/16 There is 1-39% bilateral ICA stenosis. Vertebral artery flow is antegrade.    2D Echocardiogram 05/19/16 - Left ventricle: The cavity size was normal. Wall thickness wasincreased in a pattern of moderate LVH. There was mild concentrichypertrophy. Systolic function was normal. The estimated ejectionfraction was in the range of 60% to 65%. Wall motion was normal;there were no regional wall motion abnormalities. Dopplerparameters are consistent with abnormal left ventricularrelaxation (grade 1 diastolic dysfunction). The E/e&' ratio isbetween 8-15, suggesting indeterminate LV filling pressure. - Left atrium: Moderately dilated. - Right atrium: The atrium was at the upper limits of normal insize. - Inferior vena cava: The vessel was normal in size. Therespirophasic diameter changes were in the normal range (>= 50%),consistent with normal central venous pressure. Impressions:    Compared to a prior study in 2016, there is now mild LVH, LVEF60-65%, diastolic dysfunction and moderate left atrialenlargement.  Mr [facility] and Neck Wo Contrast 06/19/2016 1. Small acute infarcts primarily involving the posterior limb of the left internal capsule.  2. Unchanged chronic infarcts as above.  3. Unchanged advanced intracranial atherosclerosis. No major vessel occlusion.  4. No cervical carotid artery stenosis.  5. Widely patent  and strongly dominant left vertebral artery.  6. Moderate proximal right vertebral artery stenosis.    CSF Component     Latest Ref Rng 08/11/2015         2:56 PM  Tube #      1  Color, CSF     COLORLESS COLORLESS  Appearance, CSF     CLEAR CLEAR  Supernatant      NOT INDICATED  RBC Count, CSF     0 /cu mm 48 (H)  WBC, CSF     0 - 5 /cu mm 1  Other Cells, CSF      TOO FEW TO COUNT, SMEAR AVAILABLE FOR REVIEW  Specimen Description        Special Requests        Gram Stain        Culture        Report Status        Fungal Smear        Crypto Ag     NEGATIVE NEGATIVE  Cryptococcal Ag Titer     NOT INDICATED NOT INDICATED  Glucose, CSF     40 - 70 mg/dL 87 (H)  Total  Protein, CSF     15 - 45 mg/dL 21  VDRL Quant, CSF  Non Rea:<1:1 Non Reactive  CSF Oligoclonal Bands      Comment   Component     Latest Ref Rng 08/11/2015         2:57 PM  Tube #        Color, CSF     COLORLESS   Appearance, CSF     CLEAR   Supernatant        RBC Count, CSF     0 /cu mm   WBC, CSF     0 - 5 /cu mm   Other Cells, CSF        Specimen Description      CSF  Special Requests      NONE  Gram Stain      WBC PRESENT, PREDOMINANTLY MONONUCLEAR . . .  Culture      NO GROWTH 3 DAYS  Report Status      08/14/2015 FINAL  Fungal Smear        Crypto Ag     NEGATIVE   Cryptococcal Ag Titer     NOT INDICATED   Glucose, CSF     40 - 70 mg/dL   Total  Protein, CSF     15 - 45 mg/dL   VDRL Quant, CSF     Non Rea:<1:1   CSF Oligoclonal Bands         Component     Latest Ref Rng 08/11/2015         2:58 PM  Tube #        Color, CSF     COLORLESS   Appearance, CSF     CLEAR   Supernatant        RBC Count, CSF     0 /cu mm   WBC, CSF     0 - 5 /cu mm   Other Cells, CSF        Specimen Description      CSF  Special Requests      NONE  Gram Stain        Culture      No Fungi Isolated in 4 Weeks . . .  Report Status      09/08/2015 FINAL  Fungal  Smear      NO YEAST OR FUNGAL ELEMENTS SEEN . . .  Crypto Ag     NEGATIVE   Cryptococcal Ag Titer     NOT INDICATED   Glucose, CSF     40 - 70 mg/dL   Total  Protein, CSF     15 - 45 mg/dL   VDRL Quant, CSF     Non Rea:<1:1   CSF Oligoclonal Bands         Component     Latest Ref Rng 08/08/2015 08/10/2015 08/11/2015  Cholesterol     0 - 200 mg/dL 409 (H)    Triglycerides     <150 mg/dL 811    HDL Cholesterol     >40 mg/dL 43    Total CHOL/HDL Ratio      4.8    VLDL     0 - 40 mg/dL 25    LDL (calc)     0 - 99 mg/dL 914 (H)    PTT Lupus Anticoagulant     0.0 - 40.6 sec  35.4   DRVVT     0.0 - 44.0 sec  34.0   Lupus Anticoag Interp       Comment:   Anticardiolipin IgG  0 - 14 GPL U/mL  <9   Anticardiolipin IgM     0 - 12 MPL U/mL  <9   Anticardiolipin IgA     0 - 11 APL U/mL  <9   Beta-2 Glyco I IgG     0 - 20 GPI IgG units  <9   Beta-2-Glycoprotein I IgM     0 - 32 GPI IgM units  <9   Beta-2-Glycoprotein I IgA     0 - 25 GPI IgA units  <9   C-ANCA     Neg:<1:20 titer  <1:20   P-ANCA     Neg:<1:20 titer  <1:20   Atypical P-ANCA titer     Neg:<1:20 titer  <1:20   Hemoglobin A1C     4.8 - 5.6 % 7.2 (H)    Mean Plasma Glucose      160    Recommendations-PTGENE:       Comment   Additional Information       Comment   Source Varicella-Zoster, PCR        BLOOD  Varicella-Zoster, PCR     Negative   Negative  ANA Ab, IFA       Negative   C3 Complement     82 - 167 mg/dL  161   Complement C4, Body Fluid     14 - 44 mg/dL  31   Compl, Total (WR60)     42 - 60 U/mL  > 60   HIV     Non Reactive  Non Reactive   Antithrombin Activity     75 - 120 %  103   Protein C Activity     73 - 180 %  152   Protein C, Total     60 - 150 %  131   Protein S Activity     63 - 140 %  85   Protein S Ag, Total     60 - 150 %  110   Homocysteine     0.0 - 15.0 umol/L  7.4   RPR     Non Reactive  Non Reactive   Sed Rate     0 - 22 mm/hr  46 (H)   ds DNA Ab      0 - 9 IU/mL  <1   SSA (Ro) (ENA) Antibody, IgG     0.0 - 0.9 AI  <0.2   SSB (La) (ENA) Antibody, IgG     0.0 - 0.9 AI  <0.2   Varicella     Immune >165 index  2055    Component     Latest Ref Rng & Units 06/19/2016 06/20/2016  Cholesterol     0 - 200 mg/dL  454  Triglycerides     <150 mg/dL  098  HDL Cholesterol     >40 mg/dL  28 (L)  Total CHOL/HDL Ratio     RATIO  4.1  VLDL     0 - 40 mg/dL  26  LDL (calc)     0 - 99 mg/dL  62  Hemoglobin J1B     4.8 - 5.6 %  7.2 (H)  Mean Plasma Glucose     mg/dL  147  TSH     8.295 - 6.213 uIU/mL 1.171     Assessment: As you may recall, she is a 50 y.o. African American female with PMH of DM, HTN, obesity, HLD was admitted on 08/07/15 for AMS and amnesia. MRI showed left ACA territory  scattered infarct as well as right hypothalamus infarct, embolic pattern. MRA and CTA showed left ACA, right ACA, left M1, M2, and b/l PCA mild to moderate stenosis. Had LP and negative for vasculitis. CUS and TTE as well as TEE were unremarkable. Hypercoagulable and autoimmue work up negative. LDL 138 and A1C 7.2. Her symptoms resolved and she was discharged with ASA and lipitor. During the interval time, no recurrent stroke like symptoms. However, BP and glucose not in control. 30 day cardiac monitoring and TCD MES all negative. Pt declined RESPECT ESUS. Readmitted on 05/18/16 due to confusion and profound memory loss, found to have right thalamic small infarct superior to prior infarct. MRA unchanged from prior CTA. CUS and TTE unremarkable, LDL 83 and A1c 11.1. She was discharged on DAPT and continued with high-dose Lipitor. Admitted again on 06/19/16 for small acute infarcts at left PLIC, still consistent with small vessel disease. LDL 62 and A1C 7.2. She was continued on DAPT and lipitor on discharge with outpt PT/OT. On aricept at 10mg  for short-term memory loss. BP and glucose in better control now.  Plan:  - continue ASA and plavix and lipitor for stroke  prevention - continue aricept for short memory loss. - close check BP and glucose at home and record and bring over to Dr. Parke SimmersBland for medication adjustment if needed - continue PT/OT  - Follow up with your primary care physician for stroke risk factor modification. Recommend maintain blood pressure goal <140/80, diabetes with hemoglobin A1c goal below 7.0% and lipids with LDL cholesterol goal below 70 mg/dL.  - diabetic diet and lose weight.  - follow up in 4 months.   I spent more than 25 minutes of face to face time with the patient. Greater than 50% of time was spent in counseling and coordination of care. We have discussed about BP and glucose monitoring, memory loss and compliance with medication, reviewed extensively the chart and images.    No orders of the defined types were placed in this encounter.   Meds ordered this encounter  Medications  . metFORMIN (GLUCOPHAGE) 500 MG tablet    Sig: Take 500 mg by mouth at bedtime.  . valsartan-hydrochlorothiazide (DIOVAN-HCT) 320-12.5 MG tablet    Sig: Take 1 tablet by mouth daily.  . Empagliflozin-Linagliptin (GLYXAMBI) 10-5 MG TABS    Sig: Take by mouth daily.    There are no Patient Instructions on file for this visit.  Marvel PlanJindong Kashis Penley, MD PhD [facility] [address], [address] [place], [name] [address] [phone]

## 2016-08-03 NOTE — Therapy (Signed)
[place] [address] [place], [name], [address] Phone: [phone]   Fax:  [phone]  Occupational Therapy Treatment  Patient Details  Name: Annette Munoz MRN: [id number] Date of Birth: 1965-10-22 Referring Provider: Veneda Melter. Candiss Norse  Encounter Date: 08/03/2016      OT End of Session - 08/03/16 1553    Visit Number 10   Number of Visits 16   Date for OT Re-Evaluation 08/22/16   Authorization Type [employer] H[employer] 60 Visit Limit Combined   Authorization - Visit Number 10   Authorization - Number of Visits 16   OT Start Time 1315   OT Stop Time 1400   OT Time Calculation (min) 45 min   Activity Tolerance Patient tolerated treatment well      Past Medical History:  Diagnosis Date  . Diabetes mellitus without complication ([place])   . Hypertension   . Stroke [facility])     Past Surgical History:  Procedure Laterality Date  . CESAREAN SECTION    . TEE WITHOUT CARDIOVERSION N/A 08/10/2015   Procedure: TRANSESOPHAGEAL ECHOCARDIOGRAM (TEE);  Surgeon: [name], MD;  Location: [facility] ENDOSCOPY;  Service: Cardiovascular;  Laterality: N/A;    There were no vitals filed for this visit.      Subjective Assessment - 08/03/16 1312    Subjective  --   Patient is accompained by: Family member  daughter   Currently in Pain? No/denies                      OT Treatments/Exercises (OP) - 08/03/16 1546      ADLs   Bathing Discussed safety with shower transfers: pt has shower chair but recommended adding grab bar for safety while transferring in/out of tub/shower, OR use of tub bench. Pt/daughter provided handout.    ADL Comments Discussed things to avoid with Rt hand for safety reasons (no cutting, nothing heavy, sharp, or breakable), however encouraged pt to use Rt hand for eating, grooming, ADLS, writing, braiding and cutting with scissors (in prep for work related tasks).  Also discussed with pt/daughter Rt inattention and decr. body awareness to Rt side and safety considerations; as well as encouraged use of 24 pc puzzles, etc.      Hand Exercises   Other Hand Exercises Gripper activity Level #1 resistance for Rt hand grip strength, control/coordination, and awareness of Rt hand with grading pressure/drops     Visual/Perceptual Exercises   Scanning Tabletop   Scanning - Tabletop Matching digital times to analogue times with slightly decreased scanning to Rt and lower quadrants using Rt hand                OT Education - 08/03/16 1551    Education provided Yes   Education Details things to avoid and encourage with RUE, body awareness, functional tasks to do at home to help with Rt hand coordination/control   Person(s) Educated Patient;Child(ren)   Methods Explanation;Demonstration   Comprehension Verbalized understanding          OT Short Term Goals - 08/01/16 0929      OT SHORT TERM GOAL #1   Title Pt will be Mod I signs and symtpoms of CVA/TIA   Status Deferred     OT SHORT TERM GOAL #2   Title Pt will be Mod I home program RUE  supervision and mod cueing   Status Achieved  07/25/16 supervision and min cueing     OT  SHORT TERM GOAL #3   Title Pt/family will I state 2-3 pieces of a/e &/or DME for home use for increased independnece and safety   Status On-going     OT SHORT TERM GOAL #4   Title Patient will dress lower body with mod cueing and intermittent mod assistance following set up.   Time 4   Period Weeks   Status Achieved  07/17/16:  min-mod A per dtr report     OT SHORT TERM GOAL #5   Title Patient will dress upper body with set up and minimal assistance   Time 4   Period Weeks   Status Achieved  07/17/16: per dtr report           OT Long Term Goals - 08/03/16 1554      OT LONG TERM GOAL #1   Title Pt will be Min A LB/UB Dressing and ADL's   Baseline Mod-max A   Time 8   Period Weeks   Status Achieved      OT LONG TERM GOAL #2   Title Pt will be Mod I-supervision updated HEP RUE   Time 8   Period Weeks   Status New     OT LONG TERM GOAL #3   Title Pt will demonstrate improved RUE strength as seen by 3+/5 MMT   Baseline -3/5 RUE   Time 8   Period Weeks   Status New     OT LONG TERM GOAL #4   Title Pt will demonstrate improved R hand coordination and grip strength as seen by improved Box & Blocks to 30 or greater and grip 30# or greater R.   Baseline Grip 19#; Box and Blocks = 13 (06/27/16)   Time 8   Period Weeks   Status New     OT LONG TERM GOAL #5   Title Pt will demonstrate improved functional mobility and balance as seen by supervision assist for simulated shower transfers   Baseline Moderate (06/27/16)   Time 8   Period Weeks   Status On-going               Plan - 08/03/16 1555    Clinical Impression Statement Pt with improvement in ADLS performing basic ADLS at supervision level. Pt met LTG #1. Pt demo right inattention/decr. body awareness   Rehab Potential Fair   Clinical Impairments Affecting Rehab Potential severity of deficits; cognitive deficits   OT Frequency 2x / week   OT Duration 8 weeks   OT Treatment/Interventions Self-care/ADL training;Therapeutic exercise;DME and/or AE instruction;Therapeutic activities;Patient/family education;Balance training;Neuromuscular education;Energy conservation;Passive range of motion;Therapeutic exercises;Visual/perceptual remediation/compensation   Plan progress towards remaining LTG's, practice braiding, using scissors   OT Home Exercise Plan 07/10/16:  cane and simple coordination HEP   Consulted and Agree with Plan of Care Patient;Family member/caregiver   Family Member Consulted Daughter      Patient will benefit from skilled therapeutic intervention in order to improve the following deficits and impairments:  Decreased balance, Decreased endurance, Impaired vision/preception, Decreased range of motion, Decreased  knowledge of precautions, Decreased cognition, Decreased activity tolerance, Decreased coordination, Decreased knowledge of use of DME, Decreased safety awareness, Decreased strength, Impaired flexibility, Impaired UE functional use, Decreased mobility, Difficulty walking  Visit Diagnosis: Muscle weakness (generalized)  Other lack of coordination  Cognitive social or emotional deficit following cerebral infarction    Problem List Patient Active Problem List   Diagnosis Date Noted  . Seizure-like activity ([place]) 06/28/2016  . Cerebral infarction due to  unspecified mechanism   . Gait difficulty   . Memory loss 05/22/2016  . Confusion   . Acute ischemic stroke ([place]) 05/18/2016  . Morbid obesity ([place]) 09/30/2015  . Uncontrolled type 2 diabetes mellitus with circulatory disorder, without long-term current use of insulin ([place])   . Essential hypertension   . Cerebrovascular accident (CVA) due to thrombosis of cerebral artery ([place])   . CVA (cerebral infarction) 08/07/2015  . Pure hypercholesterolemia 10/04/2009    Carey Bullocks, OTR/L 08/03/2016, 3:57 PM  [place] [address] [place], [name], [address] Phone: [phone]   Fax:  [phone]  Name: Annette Munoz MRN: [id number] Date of Birth: 09-Oct-1966

## 2016-08-03 NOTE — Patient Instructions (Signed)
"  I love a Parade" Lift   At counter for balance as needed: high knee marching forward and then backward. 3 second pauses with each knee lift.  Repeat 3 laps each way. Do _1-2_ sessions per day. http://gt2.exer.us/345   Copyright  VHI. All rights reserved.  Side-Stepping   At counter for balance as needed: Walk to left side with eyes open. Take even steps, leading with same foot. Make sure each foot lifts off the floor. Repeat in opposite direction. Keep feet pointed toward counter. Repeat for 3 laps each way.  Do _1-2__ sessions per day.  Copyright  VHI. All rights reserved.  Walking on Heels   At counter: Walk on heels forward while continuing on a straight path, and then walk on heels backward to starting position. Repeat for 3 laps each way. Do _1-2__ sessions per day.  Copyright  VHI. All rights reserved.  Walking on Toes   At counter for balance as needed: Walk on toes forward while continuing on a straight path, and then backwards on toes to starting position. Repeat 3 laps each way. Do _1-2___ sessions per day.  Copyright  VHI. All rights reserved.  Feet Heel-Toe "Tandem"   At counter: Arms at sides, walk a straight line forward bringing one foot directly in front of the other, and then a straight line backwards bringing one foot directly behind the other one.  Repeat for _3 laps each way. Do _1-2_ sessions per day.  Copyright  VHI. All rights reserved.  Braiding   At counter for  Balance as needed: walking sideways with following pattern "front, step, back, step" along counter, repeat the pattern the other way to start position. Repeat 3 laps each direction. Do __1-2__ sessions per day.  Copyright  VHI. All rights reserved.  

## 2016-08-03 NOTE — Therapy (Signed)
[facility] [address] [place], [name], [address] Phone: [phone]   Fax:  [phone]  Speech Language Pathology Treatment  Patient Details  Name: Annette Munoz MRN: [id number] Date of Birth: 28-Jun-1966 Referring Provider: Dr. Lucianne Lei  Encounter Date: 08/03/2016      End of Session - 08/03/16 1345    Visit Number 9   Number of Visits 17   Date for SLP Re-Evaluation 09/08/16   SLP Start Time 1017   SLP Stop Time  1100   SLP Time Calculation (min) 43 min   Activity Tolerance Patient tolerated treatment well      Past Medical History:  Diagnosis Date  . Diabetes mellitus without complication ([place])   . Hypertension   . Stroke [facility])     Past Surgical History:  Procedure Laterality Date  . CESAREAN SECTION    . TEE WITHOUT CARDIOVERSION N/A 08/10/2015   Procedure: TRANSESOPHAGEAL ECHOCARDIOGRAM (TEE);  Surgeon: [name], MD;  Location: [facility] ENDOSCOPY;  Service: Cardiovascular;  Laterality: N/A;    There were no vitals filed for this visit.      Subjective Assessment - 08/03/16 1027    Subjective Arrived with daughter today to [place] SLP TREATMENT - 08/03/16 1028      General Information   Behavior/Cognition Alert;Cooperative;Distractible;Decreased sustained attention;Requires cueing;Impulsive     Treatment Provided   Treatment provided Cognitive-Linquistic     Cognitive-Linquistic Treatment   Treatment focused on Cognition   Skilled Treatment Pt sequenced her events of the morning with max-total A from daughter. She provided her precautions with her diet with mod A from SLP/daughter. She sequenced what is done with her hair when she goes to her beauty parlor, with min-mod A usually. SLP educated pt's daughter re: daughter could perform these type of tasks at home with pt, and that her "journal" may be a good way of support with these tasks.      Assessment /  Recommendations / Plan   Plan Continue with current plan of care     Progression Toward Goals   Progression toward goals Progressing toward goals            SLP Short Term Goals - 08/03/16 1345      SLP SHORT TERM GOAL #1   Title Pt will utilize external aids/memory book to be oriented to date, appointments and daily chores with occasional min A over 3 sessions   Time 1   Period Weeks   Status Not Met     SLP SHORT TERM GOAL #2   Title Pt/family will report use of external aids/memory book at home for ADL's and medication mangement over 3 sessions   Baseline one session 07-21-16, 07-28-16   Time 1   Period Weeks   Status Partially Met     SLP SHORT TERM GOAL #3   Title Pt will verbalize 3 cognitive impairments with mod A   Time 1   Period Weeks   Status Not Met     SLP SHORT TERM GOAL #4   Title Pt will attend to simple cognitive linguistic tasks in distracting enviroment for 10 mintues with occasional min A   Time 1   Period Weeks   Status Not Met          SLP Long Term Goals - 08/03/16 1346      SLP LONG TERM GOAL #1  Title Pt/family will report use of external memory aids at home with rare min cues, for successful f/u on daily schedule, safety precautions, chores, meds, etc over 3 sessions.   Time 4   Period Weeks   Status Revised     SLP LONG TERM GOAL #2   Title Pt will verbalize 3 safety precautions due to cognitive impairments with mod A.    Time 4   Period Weeks   Status On-going     SLP LONG TERM GOAL #3   Title Pt will solve simple reasoning, organization, time and money problems with 75% accuracy and occasional min A   Time 4   Period Weeks   Status On-going     SLP LONG TERM GOAL #4   Title Pt will demonstrate emergent awareness by using memory compensation during therapy session x2 sessions   Time 4   Period Weeks   Status On-going          Plan - 08/03/16 1345    Clinical Impression Statement Pt cont to present with significant  cognitive-communication deficits. Pt did not indicate any knowledge of deficits (decr'd intellectual awareness). Continue skilled ST to maximize cognition to reduce caregivers burden.   Speech Therapy Frequency 2x / week   Treatment/Interventions Compensatory strategies;Patient/family education;Functional tasks;Cueing hierarchy;Internal/external aids;Cognitive reorganization;SLP instruction and feedback   Potential to Achieve Goals Fair   Potential Considerations Severity of impairments;Previous level of function   Consulted and Agree with Plan of Care Patient;Family member/caregiver   Family Member Consulted daughter      Patient will benefit from skilled therapeutic intervention in order to improve the following deficits and impairments:   Cognitive communication deficit    Problem List Patient Active Problem List   Diagnosis Date Noted  . Seizure-like activity ([place]) 06/28/2016  . Cerebral infarction due to unspecified mechanism   . Gait difficulty   . Memory loss 05/22/2016  . Confusion   . Acute ischemic stroke ([place]) 05/18/2016  . Morbid obesity ([place]) 09/30/2015  . Uncontrolled type 2 diabetes mellitus with circulatory disorder, without long-term current use of insulin ([place])   . Essential hypertension   . Cerebrovascular accident (CVA) due to thrombosis of cerebral artery ([place])   . CVA (cerebral infarction) 08/07/2015  . Pure hypercholesterolemia 10/04/2009    [facility] ,MS, CCC-SLP  08/03/2016, 1:47 PM  [facility] [address] [place], [name], [address] Phone: [phone]   Fax:  [phone]   Name: Annette Munoz MRN: [id number] Date of Birth: 09-01-1966

## 2016-08-03 NOTE — Patient Instructions (Signed)
-   continue ASA and plavix and lipitor for stroke prevention - continue aricept for memory. - close check BP and glucose at home and record and bring over to Dr. Parke SimmersBland for medication adjustment if needed - continue PT/OT and work hard with therapey - Follow up with your primary care physician for stroke risk factor modification. Recommend maintain blood pressure goal <140/80, diabetes with hemoglobin A1c goal below 7.0% and lipids with LDL cholesterol goal below 70 mg/dL.  - diabetic diet and lose weight.  - follow up in 4 months.

## 2016-08-03 NOTE — Patient Instructions (Signed)
Use her memory journal to track events throughout the day and use this to jog her memory throughout the weeks.

## 2016-08-04 NOTE — Therapy (Signed)
[facility] [address] [place], [name], [address] Phone: [phone]   Fax:  [phone]  Physical Therapy Treatment  Patient Details  Name: Annette Munoz MRN: [id number] Date of Birth: 1966-08-03 Referring Provider: Rosalin Hawking  Encounter Date: 08/03/2016      PT End of Session - 08/03/16 1104    Visit Number 12   Number of Visits 19   Date for PT Re-Evaluation 08/25/16   Authorization Type [employer] 60 visit limit combined   PT Start Time 1102   PT Stop Time 1145   PT Time Calculation (min) 43 min   Equipment Utilized During Treatment Gait belt   Activity Tolerance Patient tolerated treatment well   Behavior During Therapy Flat affect      Past Medical History:  Diagnosis Date  . Diabetes mellitus without complication ([place])   . Hypertension   . Stroke [facility])     Past Surgical History:  Procedure Laterality Date  . CESAREAN SECTION    . TEE WITHOUT CARDIOVERSION N/A 08/10/2015   Procedure: TRANSESOPHAGEAL ECHOCARDIOGRAM (TEE);  Surgeon: [name], MD;  Location: [facility] ENDOSCOPY;  Service: Cardiovascular;  Laterality: N/A;    There were no vitals filed for this visit.      Subjective Assessment - 08/03/16 1104    Subjective No new complaints. No pain or falls to report. Daughter reports the new balance HEP is going well at home.   Patient is accompained by: Family member  daughter   Pertinent History HTN, DM, CVA (x 2), with recent TIA per family report; weakness on R side new since 06/18/16   Patient Stated Goals Pt's goals for therapy are to "do everything I need to do."   Currently in Pain? No/denies   Pain Score 0-No pain             [facility] Adult PT Treatment/Exercise - 08/03/16 1124      Transfers   Transfers Sit to Stand;Stand to Sit   Sit to Stand 5: Supervision;With upper extremity assist;From bed   Stand to Sit 5: Supervision;With upper extremity assist;To bed     Ambulation/Gait   Ambulation/Gait Yes   Ambulation/Gait Assistance 5: Supervision   Ambulation/Gait Assistance Details occasional cues on step length (to increase it) and on walker position with gait as pt tends to push it too far out at times   Ambulation Distance (Feet) 650 Feet  x1   Assistive device Rolling walker   Gait Pattern Step-through pattern;Decreased stride length;Decreased step length - right;Decreased step length - left;Decreased stance time - right;Decreased weight shift to right;Right genu recurvatum;Narrow base of support;Poor foot clearance - right   Ambulation Surface Level;Indoor   Ramp 4: Min assist   Curb Other (comment)  min guard assist             Balance Exercises - 08/03/16 1137      Balance Exercises: Standing   SLS with Vectors Solid surface;Other reps (comment);Limitations     Balance Exercises: Standing   Standing Eyes Closed Limitations :   SLS with Vectors Limitations on floor with half foam bubbles: alternating fwd toe tappping and cross toe tapping to each x 10 reps bil legs with no UE support, min guard to min assist for balance with cues on posture and weight shifting.      Issued the following to pt's HEP:   "I love a Database administrator   At counter for balance as needed: high knee marching forward  and then backward. 3 second pauses with each knee lift.  Repeat 3 laps each way. Do _1-2_ sessions per day. http://gt2.exer.us/345   Copyright  VHI. All rights reserved.  Side-Stepping   At counter for balance as needed: Walk to left side with eyes open. Take even steps, leading with same foot. Make sure each foot lifts off the floor. Repeat in opposite direction. Keep feet pointed toward counter. Repeat for 3 laps each way.  Do _1-2__ sessions per day.  Copyright  VHI. All rights reserved.  Walking on Heels   At counter: Walk on heels forward while continuing on a straight path, and then walk on heels backward to starting position. Repeat for 3 laps each  way. Do _1-2__ sessions per day.  Copyright  VHI. All rights reserved.  Walking on Toes   At counter for balance as needed: Walk on toes forward while continuing on a straight path, and then backwards on toes to starting position. Repeat 3 laps each way. Do _1-2___ sessions per day.  Copyright  VHI. All rights reserved.  Feet Heel-Toe "Tandem"   At counter: Arms at sides, walk a straight line forward bringing one foot directly in front of the other, and then a straight line backwards bringing one foot directly behind the other one.  Repeat for _3 laps each way. Do _1-2_ sessions per day.  Copyright  VHI. All rights reserved.  Braiding   At counter for  Balance as needed: walking sideways with following pattern "front, step, back, step" along counter, repeat the pattern the other way to start position. Repeat 3 laps each direction. Do __1-2__ sessions per day.  Copyright  VHI. All rights reserved.      PT Education - 08/03/16 1124    Education provided Yes   Education Details counter balance activites for [employer]) Educated Patient;Child(ren)   Methods Explanation;Demonstration;Verbal cues;Handout   Comprehension Verbalized understanding;Returned demonstration;Verbal cues required;Need further instruction          PT Short Term Goals - 07/25/16 2347      PT SHORT TERM GOAL #1   Title Pt/family will verbalize/demo understanding of HEP for strength, balance, transfers, and gait.  TARGET 07/25/16   Baseline Pt requires assistance of daughter for HEP-daughter verbalizes understanding   Time 4   Period Weeks   Status Achieved     PT SHORT TERM GOAL #2   Title Pt will perform sit<>stand transfers with correct sequence, with supervision, 8 of 10 trials, for improved safety and efficiency with transfers.   Time 4   Period Weeks   Status Achieved     PT SHORT TERM GOAL #3   Title Pt will improve TUG score to less than or equal to 28 seconds for decreased fall  risk.   Baseline 29.89 sec at best 07/25/16   Time 4   Period Weeks   Status Not Met     PT SHORT TERM GOAL #4   Title Pt will negotiate at least 4 steps using 1 handrail, with min asistance, for improved safety with home stair negotiation.   Time 4   Period Weeks   Status Achieved     PT SHORT TERM GOAL #5   Title Pt will improve Berg score by at least 5 points (>11/56) for decreased fall risk.   Baseline 27/56 07/25/16   Time 4   Period Weeks   Status Achieved           PT Long Term Goals -  07/25/16 2355      PT LONG TERM GOAL #1   Title Pt/family will verbalize understanding of fall prevention within the home environment.  TARGET 08/25/16   Time 8   Period Weeks   Status New     PT LONG TERM GOAL #2   Title Pt will improve gait velocity to at least 1.3 ft/sec for improved household gait.   Time 8   Period Weeks   Status New     PT LONG TERM GOAL #3   Title Pt will improve TUG score to less than or equal to 20 seconds for decreased fall risk.   Time 8   Period Weeks     PT LONG TERM GOAL #4   Title Pt will improve Berg Balance score to at least 21/56 for decreased fall risk.-MOdified to > 32/56 (07/25/16-AWM)   Time 8   Period Weeks   Status New     PT LONG TERM GOAL #5   Title Pt will ambulate at least 500 ft, indoor/outdoor surfaces with supervision, for improved household and community ambulation.   Time 8   Period Weeks   Status New     PT LONG TERM GOAL #6   Title Pt will negotiate at least 4 steps, 1 rail, with supervision of family, for improved safety with stair negotiation into/out of the home.   Time 8   Period Weeks   Status New            Plan - 08/03/16 1105    Clinical Impression Statement Added counter top balance activities to pt's HEP during today's session. Daughter present with pt and was able to verbalize understanding on how to assist/cue pt at home. Remainder of today's session continued to address gait and high level balance  without any issues reported. Pt is making slow, steady progress toward goals and should benefit from continued PT to progress toward unmet goals.                            Rehab Potential Fair   Clinical Impairments Affecting Rehab Potential Cognition (pt does have family support of husband and daughter)   PT Frequency 2x / week   PT Duration Other (comment)  9 weeks; plus eval   PT Treatment/Interventions ADLs/Self Care Home Management;Functional mobility training;Stair training;Gait training;DME Instruction;Therapeutic activities;Therapeutic exercise;Balance training;Neuromuscular re-education;Patient/family education;Orthotic Fit/Training   PT Next Visit Plan work on balance, advance balance HEP to include counter top activies as safe for pt to perform with daughter/spouse   Consulted and Agree with Plan of Care Patient;Family member/caregiver   Family Member Consulted daughter      Patient will benefit from skilled therapeutic intervention in order to improve the following deficits and impairments:  Abnormal gait, Decreased balance, Decreased cognition, Decreased mobility, Decreased knowledge of use of DME, Decreased safety awareness, Decreased strength, Difficulty walking, Postural dysfunction, Impaired tone, Increased fascial restricitons  Visit Diagnosis: Muscle weakness (generalized)  Unsteadiness on feet  Other abnormalities of gait and mobility     Problem List Patient Active Problem List   Diagnosis Date Noted  . Seizure-like activity ([place]) 06/28/2016  . Cerebral infarction due to unspecified mechanism   . Gait difficulty   . Memory loss 05/22/2016  . Confusion   . Acute ischemic stroke ([place]) 05/18/2016  . Morbid obesity ([place]) 09/30/2015  . Uncontrolled type 2 diabetes mellitus with circulatory disorder, without long-term current use of insulin ([place])   .  Essential hypertension   . Cerebrovascular accident (CVA) due to thrombosis of cerebral artery ([place])   . CVA  (cerebral infarction) 08/07/2015  . Pure hypercholesterolemia 10/04/2009    Willow Ora, PTA, [facility] Outpatient Neuro [facility] [address], [place], [place] [address] [phone] 08/04/16, 4:59 PM    Name: Annette Munoz MRN: [id number] Date of Birth: [birth year]

## 2016-08-07 ENCOUNTER — Encounter: Payer: Self-pay | Admitting: *Deleted

## 2016-08-07 ENCOUNTER — Ambulatory Visit: Payer: Commercial Managed Care - HMO | Admitting: *Deleted

## 2016-08-07 DIAGNOSIS — M6281 Muscle weakness (generalized): Secondary | ICD-10-CM

## 2016-08-07 DIAGNOSIS — R278 Other lack of coordination: Secondary | ICD-10-CM

## 2016-08-07 DIAGNOSIS — IMO0002 Reserved for concepts with insufficient information to code with codable children: Secondary | ICD-10-CM

## 2016-08-07 DIAGNOSIS — I69315 Cognitive social or emotional deficit following cerebral infarction: Secondary | ICD-10-CM

## 2016-08-07 NOTE — Therapy (Signed)
[facility] [address] [place], [name], [address] Phone: [phone]   Fax:  [phone]  Occupational Therapy Treatment  Patient Details  Name: Annette Munoz MRN: [id number] Date of Birth: 06/06/1966 Referring Provider: Zenia ResidesSr Prashant K. Thedore MinsSingh  Encounter Date: 08/07/2016      OT End of Session - 08/07/16 1217    Visit Number 11   Number of Visits 16   Date for OT Re-Evaluation 08/22/16   Authorization Type [employer] HMO 60 Visit Limit Combined   Authorization - Visit Number 11   Authorization - Number of Visits 16   OT Start Time 1022   OT Stop Time 1100   OT Time Calculation (min) 38 min   Activity Tolerance Patient tolerated treatment well   Behavior During Therapy Flat affect      Past Medical History:  Diagnosis Date  . Diabetes mellitus without complication (HCC)   . Hypertension   . Stroke [facility](HCC)     Past Surgical History:  Procedure Laterality Date  . CESAREAN SECTION    . TEE WITHOUT CARDIOVERSION N/A 08/10/2015   Procedure: TRANSESOPHAGEAL ECHOCARDIOGRAM (TEE);  Surgeon: Chrystie NoseKenneth C Hilty, MD;  [facility];  Service: Cardiovascular;  Laterality: N/A;    There were no vitals filed for this visit.      Subjective Assessment - 08/07/16 1026    Subjective  Pt denies pain and repotrs doing well.    Patient is accompained by: Family member  Daughter   Currently in Pain? No/denies   Pain Score 0-No pain                      OT Treatments/Exercises (OP) - 08/07/16 0001      Cognitive Exercises   Other Cognitive Exercises 1 Discussed memory strategies that daughter has implemented in the home. Keeping daily calendar, weekly calendar and sticky notes. Daughter reports good success using these techniques   Other Cognitive Exercises 2 Pt unable to state Home Program, req vc's from daughter     Exercises   Exercises Hand;Neurological Re-education      Hand Exercises   Theraputty - Grip Grip increased to green putty RUE x15 reps   Theraputty - Pinch Pinch - 3-point RUE x15 reps using Green putty     Neurological Re-education Exercises   Other Exercises 1 Neuromuscular re-ed w/ visual scanning in standing to place large pegs into pegboard on vertical surface while copying/scanning pattern using RUE to place and remove pegs. Min-mod VC's for positioning of pegs, following pattern and then  to use only RUE and not Left x2, Pt dropped several pegs as well.   Other Exercises 2 Cutting strips of paper with R hand, attempting to cut out shapes/lines and circles with Moderate difficulty. When as ked pt was it difficult or hard to do, she stated "No, not at all" Then later stated "It was hard"                OT Education - 08/07/16 1216    Education provided Yes   Education Details Encourage RUE use, fine motor ex's; R sided awareness, upgraded to green theraputty. Begin cutting and braiding at home with family assist and supervision PRN.    Person(s) Educated Patient;Child(ren)   Methods Explanation;Demonstration   Comprehension Verbalized understanding;Other (comment)          OT Short Term Goals - 08/01/16 0929      OT SHORT TERM GOAL #  1   Title Pt will be Mod I signs and symtpoms of CVA/TIA   Status Deferred     OT SHORT TERM GOAL #2   Title Pt will be Mod I home program RUE  supervision and mod cueing   Status Achieved  07/25/16 supervision and min cueing     OT SHORT TERM GOAL #3   Title Pt/family will I state 2-3 pieces of a/e &/or DME for home use for increased independnece and safety   Status On-going     OT SHORT TERM GOAL #4   Title Patient will dress lower body with mod cueing and intermittent mod assistance following set up.   Time 4   Period Weeks   Status Achieved  07/17/16:  min-mod A per dtr report     OT SHORT TERM GOAL #5   Title Patient will dress upper body with set up and minimal assistance    Time 4   Period Weeks   Status Achieved  07/17/16: per dtr report           OT Long Term Goals - 08/03/16 1554      OT LONG TERM GOAL #1   Title Pt will be Min A LB/UB Dressing and ADL's   Baseline Mod-max A   Time 8   Period Weeks   Status Achieved     OT LONG TERM GOAL #2   Title Pt will be Mod I-supervision updated HEP RUE   Time 8   Period Weeks   Status New     OT LONG TERM GOAL #3   Title Pt will demonstrate improved RUE strength as seen by 3+/5 MMT   Baseline -3/5 RUE   Time 8   Period Weeks   Status New     OT LONG TERM GOAL #4   Title Pt will demonstrate improved R hand coordination and grip strength as seen by improved Box & Blocks to 30 or greater and grip 30# or greater R.   Baseline Grip 19#; Box and Blocks = 13 (06/27/16)   Time 8   Period Weeks   Status New     OT LONG TERM GOAL #5   Title Pt will demonstrate improved functional mobility and balance as seen by supervision assist for simulated shower transfers   Baseline Moderate (06/27/16)   Time 8   Period Weeks   Status On-going               Plan - 08/07/16 1218    Clinical Impression Statement Patient family reports improved basic ADL's w/ supervision. Right inattention/decreased body awareness and cognition noted.   Rehab Potential Fair   Clinical Impairments Affecting Rehab Potential severity of deficits; cognitive deficits   OT Frequency 2x / week   OT Duration 8 weeks   OT Treatment/Interventions Self-care/ADL training;Therapeutic exercise;DME and/or AE instruction;Therapeutic activities;Patient/family education;Balance training;Neuromuscular education;Energy conservation;Passive range of motion;Therapeutic exercises;Visual/perceptual remediation/compensation   Plan Progress toward remaining LTG's - FM/coordination, braiding/scissors etc.   Consulted and Agree with Plan of Care Patient;Family member/caregiver   Family Member Consulted Daughter      Patient will benefit from  skilled therapeutic intervention in order to improve the following deficits and impairments:  Decreased balance, Decreased endurance, Impaired vision/preception, Decreased range of motion, Decreased knowledge of precautions, Decreased cognition, Decreased activity tolerance, Decreased coordination, Decreased knowledge of use of DME, Decreased safety awareness, Decreased strength, Impaired flexibility, Impaired UE functional use, Decreased mobility, Difficulty walking  Visit Diagnosis: Muscle weakness (generalized)  Other lack of coordination  Cognitive social or emotional deficit following cerebral infarction  Cerebrovascular accident (CVA) with cognitive communication deficit [facility])    Problem List Patient Active Problem List   Diagnosis Date Noted  . Seizure-like activity (HCC) 06/28/2016  . Cerebral infarction due to unspecified mechanism   . Gait difficulty   . Memory loss 05/22/2016  . Confusion   . Acute ischemic stroke (HCC) 05/18/2016  . Morbid obesity (HCC) 09/30/2015  . Uncontrolled type 2 diabetes mellitus with circulatory disorder, without long-term current use of insulin (HCC)   . Essential hypertension   . Cerebrovascular accident (CVA) due to thrombosis of cerebral artery (HCC)   . CVA (cerebral infarction) 08/07/2015  . Pure hypercholesterolemia 10/04/2009    Mariam Dollar Beth Dixon , OTR/L 08/07/2016, 12:26 PM  [facility] [address] [place], [place], [address] Phone: [phone]   Fax:  [phone]  Name: Annette Munoz MRN: [id number] Date of Birth: [birth year]

## 2016-08-08 ENCOUNTER — Ambulatory Visit: Payer: Commercial Managed Care - HMO | Admitting: Occupational Therapy

## 2016-08-08 ENCOUNTER — Ambulatory Visit: Payer: Commercial Managed Care - HMO | Admitting: Physical Therapy

## 2016-08-08 ENCOUNTER — Encounter: Payer: Self-pay | Admitting: Physical Therapy

## 2016-08-08 ENCOUNTER — Telehealth: Payer: Self-pay | Admitting: *Deleted

## 2016-08-08 ENCOUNTER — Ambulatory Visit: Payer: Commercial Managed Care - HMO | Admitting: Speech Pathology

## 2016-08-08 DIAGNOSIS — Z0271 Encounter for disability determination: Secondary | ICD-10-CM

## 2016-08-08 DIAGNOSIS — M6281 Muscle weakness (generalized): Secondary | ICD-10-CM | POA: Diagnosis not present

## 2016-08-08 DIAGNOSIS — I69315 Cognitive social or emotional deficit following cerebral infarction: Secondary | ICD-10-CM

## 2016-08-08 DIAGNOSIS — R2681 Unsteadiness on feet: Secondary | ICD-10-CM

## 2016-08-08 DIAGNOSIS — R2689 Other abnormalities of gait and mobility: Secondary | ICD-10-CM

## 2016-08-08 DIAGNOSIS — R278 Other lack of coordination: Secondary | ICD-10-CM

## 2016-08-08 DIAGNOSIS — R41841 Cognitive communication deficit: Secondary | ICD-10-CM

## 2016-08-08 NOTE — Therapy (Signed)
[facility] [address] [place], [place], [address] Phone: [phone]   Fax:  [phone]  Occupational Therapy Treatment  Patient Details  Name: Annette Munoz MRN: [id number] Date of Birth: Jan 22, 1966 Referring Provider: Zenia Resides. Thedore Mins  Encounter Date: 08/08/2016      OT End of Session - 08/08/16 0937    Visit Number 12   Number of Visits 16   Date for OT Re-Evaluation 08/22/16   Authorization Type [employer] HMO 60 Visit Limit Combined   Authorization - Visit Number 12   Authorization - Number of Visits 16   OT Start Time [phone]   OT Stop Time 1015   OT Time Calculation (min) 41 min   Activity Tolerance Patient tolerated treatment well   Behavior During Therapy Flat affect      Past Medical History:  Diagnosis Date  . Diabetes mellitus without complication (HCC)   . Hypertension   . Stroke [facility])     Past Surgical History:  Procedure Laterality Date  . CESAREAN SECTION    . TEE WITHOUT CARDIOVERSION N/A 08/10/2015   Procedure: TRANSESOPHAGEAL ECHOCARDIOGRAM (TEE);  Surgeon: Chrystie Nose, MD;  Location: [facility] ENDOSCOPY;  Service: Cardiovascular;  Laterality: N/A;    There were no vitals filed for this visit.      Subjective Assessment - 08/08/16 0933    Subjective  pt reports that she put her earrings in herself today   Patient is accompained by: Family member  Daughter   Currently in Pain? No/denies      Practiced cutting with scissors to cut out simple shapes and along curved/zig-zag lines with min-mod decr in accuracy, particularly with shapes.  Completing purdue pegboard with min difficulty for coordination R hand.    Stringing "beads" with R hand with min difficulty.  Recommended doing simple things in the kitchen in sitting/or standing at counter with supervision (nothing sharp, hot, breakable, or heavy) including:  Stirring, washing vegetables, snapping  green beans, making sandwich, etc.  Pt/dtr verbalized understanding.  Functional reaching to place clothespins with 1-8lb resistance on vertical pole for incr strength and coordination with min v.c. For shoulder/trunk compensation.                          OT Short Term Goals - 08/01/16 0929      OT SHORT TERM GOAL #1   Title Pt will be Mod I signs and symtpoms of CVA/TIA   Status Deferred     OT SHORT TERM GOAL #2   Title Pt will be Mod I home program RUE  supervision and mod cueing   Status Achieved  07/25/16 supervision and min cueing     OT SHORT TERM GOAL #3   Title Pt/family will I state 2-3 pieces of a/e &/or DME for home use for increased independnece and safety   Status On-going     OT SHORT TERM GOAL #4   Title Patient will dress lower body with mod cueing and intermittent mod assistance following set up.   Time 4   Period Weeks   Status Achieved  07/17/16:  min-mod A per dtr report     OT SHORT TERM GOAL #5   Title Patient will dress upper body with set up and minimal assistance   Time 4   Period Weeks   Status Achieved  07/17/16: per dtr report  OT Long Term Goals - 08/03/16 1554      OT LONG TERM GOAL #1   Title Pt will be Min A LB/UB Dressing and ADL's   Baseline Mod-max A   Time 8   Period Weeks   Status Achieved     OT LONG TERM GOAL #2   Title Pt will be Mod I-supervision updated HEP RUE   Time 8   Period Weeks   Status New     OT LONG TERM GOAL #3   Title Pt will demonstrate improved RUE strength as seen by 3+/5 MMT   Baseline -3/5 RUE   Time 8   Period Weeks   Status New     OT LONG TERM GOAL #4   Title Pt will demonstrate improved R hand coordination and grip strength as seen by improved Box & Blocks to 30 or greater and grip 30# or greater R.   Baseline Grip 19#; Box and Blocks = 13 (06/27/16)   Time 8   Period Weeks   Status New     OT LONG TERM GOAL #5   Title Pt will demonstrate improved functional  mobility and balance as seen by supervision assist for simulated shower transfers   Baseline Moderate (06/27/16)   Time 8   Period Weeks   Status On-going               Plan - 08/08/16 1044    Clinical Impression Statement Pt demo improved coordination and RUE functional use; however, decr awareness and memory/cognitive deficits are barriers to incr ADL/IADL participation.   Rehab Potential Fair   Clinical Impairments Affecting Rehab Potential severity of deficits; cognitive deficits   OT Frequency 2x / week   OT Duration 8 weeks   OT Treatment/Interventions Self-care/ADL training;Therapeutic exercise;DME and/or AE instruction;Therapeutic activities;Patient/family education;Balance training;Neuromuscular education;Energy conservation;Passive range of motion;Therapeutic exercises;Visual/perceptual remediation/compensation   Plan continue with coordination, visual scanning, safety, ADL/IADL performance   Consulted and Agree with Plan of Care Patient;Family member/caregiver   Family Member Consulted Daughter      Patient will benefit from skilled therapeutic intervention in order to improve the following deficits and impairments:  Decreased balance, Decreased endurance, Impaired vision/preception, Decreased range of motion, Decreased knowledge of precautions, Decreased cognition, Decreased activity tolerance, Decreased coordination, Decreased knowledge of use of DME, Decreased safety awareness, Decreased strength, Impaired flexibility, Impaired UE functional use, Decreased mobility, Difficulty walking  Visit Diagnosis: Other lack of coordination  Muscle weakness (generalized)  Cognitive social or emotional deficit following cerebral infarction    Problem List Patient Active Problem List   Diagnosis Date Noted  . Seizure-like activity (HCC) 06/28/2016  . Cerebral infarction due to unspecified mechanism   . Gait difficulty   . Memory loss 05/22/2016  . Confusion   . Acute  ischemic stroke (HCC) 05/18/2016  . Morbid obesity (HCC) 09/30/2015  . Uncontrolled type 2 diabetes mellitus with circulatory disorder, without long-term current use of insulin (HCC)   . Essential hypertension   . Cerebrovascular accident (CVA) due to thrombosis of cerebral artery (HCC)   . CVA (cerebral infarction) 08/07/2015  . Pure hypercholesterolemia 10/04/2009    [facility] Center[facility]REEMAN,Anoushka Divito 08/08/2016, 10:46 AM  [facility] [address] [place], [name], [address] Phone: [phone]   Fax:  [phone]  Name: Salena SanerBetty H Laker MRN: [id number] Date of Birth: 10/30/1965   Willa FraterAngela Skyrah Krupp, OTR/L [facility] [address] [place], [name]  [address] [phone] phone [phone] 08/08/16 10:47  AM     

## 2016-08-08 NOTE — Patient Instructions (Signed)
  Continue folding clothes and towels  Bring chair in kitchen - empty siverware  Dry pots or pans sitting   Make up  Light dusting  Wash the sink and toilet in bathroom  File, sort mail and paper work

## 2016-08-08 NOTE — Telephone Encounter (Signed)
A request faxed to [facility] on 08/03/16 and 08/08/16 to [phone].

## 2016-08-08 NOTE — Therapy (Signed)
[facility] [address] [place], [name], [address] Phone: [phone]   Fax:  [phone]  Speech Language Pathology Treatment  Patient Details  Name: Annette Munoz MRN: [id number] Date of Birth: Sep 03, 1966 Referring Provider: Dr. Lucianne Lei  Encounter Date: 08/08/2016      End of Session - 08/08/16 1210    Visit Number 10   Number of Visits 17   Date for SLP Re-Evaluation 09/08/16   SLP Start Time 1018   SLP Stop Time  1101   SLP Time Calculation (min) 43 min      Past Medical History:  Diagnosis Date  . Diabetes mellitus without complication ([place])   . Hypertension   . Stroke [facility])     Past Surgical History:  Procedure Laterality Date  . CESAREAN SECTION    . TEE WITHOUT CARDIOVERSION N/A 08/10/2015   Procedure: TRANSESOPHAGEAL ECHOCARDIOGRAM (TEE);  Surgeon: [name], MD;  Location: [facility] ENDOSCOPY;  Service: Cardiovascular;  Laterality: N/A;    There were no vitals filed for this visit.             ADULT SLP TREATMENT - 08/08/16 1029      General Information   Behavior/Cognition Alert;Cooperative;Distractible;Decreased sustained attention;Requires cueing;Impulsive     Treatment Provided   Treatment provided Cognitive-Linquistic     Cognitive-Linquistic Treatment   Treatment focused on Cognition   Skilled Treatment Pt and daughter forgot notebook, but report Lola is using it to recall  date, daily events . Daughter reports pt has been oriented to the date and date I over past 4 mornings. Short term memory and selective attention targeted today with simple card sort with 3 rules to recall with frequent min A - Pt returned to task after being engaged in simple conversation with mod I - She continued card sort while ST and daughter provided distraction of conversation with eachother with mod I. Attention to detail and simple functional math word problems rerquired occasional min to mod  verbal cues for organizing problem and  error awareness.      Assessment / Recommendations / Plan   Plan Continue with current plan of care     Progression Toward Goals   Progression toward goals Progressing toward goals          SLP Education - 08/08/16 1206    Education provided Yes   Education Details encourage simple chores pt can do at home for structure, conentration/attention   Person(s) Educated Patient;Spouse   Methods Explanation;Demonstration;Handout   Comprehension Verbalized understanding          SLP Short Term Goals - 08/08/16 1209      SLP SHORT TERM GOAL #1   Title Pt will utilize external aids/memory book to be oriented to date, appointments and daily chores with occasional min A over 3 sessions   Time 1   Period Weeks   Status Not Met     SLP SHORT TERM GOAL #2   Title Pt/family will report use of external aids/memory book at home for ADL's and medication mangement over 3 sessions   Baseline one session 07-21-16, 07-28-16   Time 1   Period Weeks   Status Partially Met     SLP SHORT TERM GOAL #3   Title Pt will verbalize 3 cognitive impairments with mod A   Time 1   Period Weeks   Status Not Met     SLP SHORT TERM GOAL #4   Title Pt will attend  to simple cognitive linguistic tasks in distracting enviroment for 10 mintues with occasional min A   Time 1   Period Weeks   Status Not Met          SLP Long Term Goals - 08/08/16 1210      SLP LONG TERM GOAL #1   Title Pt/family will report use of external memory aids at home with rare min cues, for successful f/u on daily schedule, safety precautions, chores, meds, etc over 3 sessions.   Time 3   Period Weeks   Status Revised     SLP LONG TERM GOAL #2   Title Pt will verbalize 3 safety precautions due to cognitive impairments with mod A.    Time 3   Period Weeks   Status On-going     SLP LONG TERM GOAL #3   Title Pt will solve simple reasoning, organization, time and money problems with  75% accuracy and occasional min A   Time 3   Period Weeks   Status On-going     SLP LONG TERM GOAL #4   Title Pt will demonstrate emergent awareness by using memory compensation during therapy session x2 sessions   Time 3   Period Weeks   Status On-going          Plan - 08/08/16 1208    Clinical Impression Statement Pt demonstrating some improved selective attention and short term recall. Daughter reports orientation is improving, Pt continues to require max A for awareness. Continue skilled ST to maximize cognition and reduce caregiver burden   Speech Therapy Frequency 2x / week   Treatment/Interventions Compensatory strategies;Patient/family education;Functional tasks;Cueing hierarchy;Internal/external aids;Cognitive reorganization;SLP instruction and feedback   Potential to Achieve Goals Fair   Potential Considerations Severity of impairments;Previous level of function   Consulted and Agree with Plan of Care Patient;Family member/caregiver   Family Member Consulted daughter      Patient will benefit from skilled therapeutic intervention in order to improve the following deficits and impairments:   Cognitive communication deficit    Problem List Patient Active Problem List   Diagnosis Date Noted  . Seizure-like activity ([place]) 06/28/2016  . Cerebral infarction due to unspecified mechanism   . Gait difficulty   . Memory loss 05/22/2016  . Confusion   . Acute ischemic stroke ([place]) 05/18/2016  . Morbid obesity ([place]) 09/30/2015  . Uncontrolled type 2 diabetes mellitus with circulatory disorder, without long-term current use of insulin ([place])   . Essential hypertension   . Cerebrovascular accident (CVA) due to thrombosis of cerebral artery ([place])   . CVA (cerebral infarction) 08/07/2015  . Pure hypercholesterolemia 10/04/2009    Aayana Reinertsen, [name] MS, CCC-SLP 08/08/2016, 12:11 PM  [facility] [address] [place], [name], [address] Phone: [phone]   Fax:  [phone]   Name: NIKITHA MODE MRN: [id number] Date of Birth: [birth year]

## 2016-08-09 NOTE — Therapy (Signed)
[facility] [address] [place], [name], [address] Phone: [phone]   Fax:  [phone]  Physical Therapy Treatment  Patient Details  Name: Annette Munoz MRN: [id number] Date of Birth: December 06, 1965 Referring Provider: Rosalin Hawking  Encounter Date: 08/08/2016      PT End of Session - 08/08/16 0858    Visit Number 13   Number of Visits 19   Date for PT Re-Evaluation 08/25/16   Authorization Type [employer] 60 visit limit combined   PT Start Time 4585  pt late for session   PT Stop Time 0930   PT Time Calculation (min) 35 min   Equipment Utilized During Treatment Gait belt   Activity Tolerance Patient tolerated treatment well   Behavior During Therapy Flat affect      Past Medical History:  Diagnosis Date  . Diabetes mellitus without complication ([place])   . Hypertension   . Stroke [facility])     Past Surgical History:  Procedure Laterality Date  . CESAREAN SECTION    . TEE WITHOUT CARDIOVERSION N/A 08/10/2015   Procedure: TRANSESOPHAGEAL ECHOCARDIOGRAM (TEE);  Surgeon: [name], MD;  Location: [facility] ENDOSCOPY;  Service: Cardiovascular;  Laterality: N/A;    There were no vitals filed for this visit.      Subjective Assessment - 08/08/16 0858    Subjective No new complaints. No pain or falls to report.   Patient is accompained by: Family member  daughter   Pertinent History HTN, DM, CVA (x 2), with recent TIA per family report; weakness on R side new since 06/18/16   Patient Stated Goals Pt's goals for therapy are to "do everything I need to do."   Currently in Pain? No/denies   Pain Score 0-No pain              [facility] Adult PT Treatment/Exercise - 08/08/16 0900      Transfers   Transfers Sit to Stand;Stand to Sit   Sit to Stand 5: Supervision;With upper extremity assist;From bed   Sit to Stand Details Verbal cues for technique;Verbal cues for precautions/safety;Verbal cues for safe use of DME/AE    Stand to Sit 5: Supervision;With upper extremity assist;To bed   Stand to Sit Details (indicate cue type and reason) Verbal cues for technique;Verbal cues for precautions/safety;Verbal cues for safe use of DME/AE     Ambulation/Gait   Ambulation/Gait Yes   Ambulation/Gait Assistance 5: Supervision   Ambulation/Gait Assistance Details occasional cues for increased right foot clearance and step length with gait   Ambulation Distance (Feet) 450 Feet  x1   Assistive device Rolling walker   Gait Pattern Step-through pattern;Decreased stride length;Decreased step length - right;Decreased step length - left;Decreased stance time - right;Decreased weight shift to right;Right genu recurvatum;Narrow base of support;Poor foot clearance - right   Ambulation Surface Level;Indoor     Neuro Re-ed    Neuro Re-ed Details  on blue mat with 2 tall cones: alternating fwd toe taps to each, alternating cross toe taps to each x 10 reps each with min guard to min assist for balance; standing with feet across blue foam beam: alternating fwd stepping to floor and back onto foam, alternating bwd stepping to floor and back onto foam x 10 each bil legs. min to mod assist for balance. multimodal cues needed on ex form and technique with all balance activites performed.  PT Short Term Goals - 07/25/16 2347      PT SHORT TERM GOAL #1   Title Pt/family will verbalize/demo understanding of HEP for strength, balance, transfers, and gait.  TARGET 07/25/16   Baseline Pt requires assistance of daughter for HEP-daughter verbalizes understanding   Time 4   Period Weeks   Status Achieved     PT SHORT TERM GOAL #2   Title Pt will perform sit<>stand transfers with correct sequence, with supervision, 8 of 10 trials, for improved safety and efficiency with transfers.   Time 4   Period Weeks   Status Achieved     PT SHORT TERM GOAL #3   Title Pt will improve TUG score to less than or  equal to 28 seconds for decreased fall risk.   Baseline 29.89 sec at best 07/25/16   Time 4   Period Weeks   Status Not Met     PT SHORT TERM GOAL #4   Title Pt will negotiate at least 4 steps using 1 handrail, with min asistance, for improved safety with home stair negotiation.   Time 4   Period Weeks   Status Achieved     PT SHORT TERM GOAL #5   Title Pt will improve Berg score by at least 5 points (>11/56) for decreased fall risk.   Baseline 27/56 07/25/16   Time 4   Period Weeks   Status Achieved           PT Long Term Goals - 07/25/16 2355      PT LONG TERM GOAL #1   Title Pt/family will verbalize understanding of fall prevention within the home environment.  TARGET 08/25/16   Time 8   Period Weeks   Status New     PT LONG TERM GOAL #2   Title Pt will improve gait velocity to at least 1.3 ft/sec for improved household gait.   Time 8   Period Weeks   Status New     PT LONG TERM GOAL #3   Title Pt will improve TUG score to less than or equal to 20 seconds for decreased fall risk.   Time 8   Period Weeks     PT LONG TERM GOAL #4   Title Pt will improve Berg Balance score to at least 21/56 for decreased fall risk.-MOdified to > 32/56 (07/25/16-AWM)   Time 8   Period Weeks   Status New     PT LONG TERM GOAL #5   Title Pt will ambulate at least 500 ft, indoor/outdoor surfaces with supervision, for improved household and community ambulation.   Time 8   Period Weeks   Status New     PT LONG TERM GOAL #6   Title Pt will negotiate at least 4 steps, 1 rail, with supervision of family, for improved safety with stair negotiation into/out of the home.   Time 8   Period Weeks   Status New            Plan - 08/08/16 0858    Clinical Impression Statement Today's skilled session continued to address gait, activity tolerance and high level balance activites without any issues reported. Pt continues to make steady progress toward goals and should benefit from  continued PT to progress toward unmet goals.    Rehab Potential Fair   Clinical Impairments Affecting Rehab Potential Cognition (pt does have family support of husband and daughter)   PT Frequency 2x / week   PT Duration Other (comment)  9 weeks; plus eval   PT Treatment/Interventions ADLs/Self Care Home Management;Functional mobility training;Stair training;Gait training;DME Instruction;Therapeutic activities;Therapeutic exercise;Balance training;Neuromuscular re-education;Patient/family education;Orthotic Fit/Training   PT Next Visit Plan work on balance, strengthening and gait toward goals.   Consulted and Agree with Plan of Care Patient;Family member/caregiver   Family Member Consulted daughter      Patient will benefit from skilled therapeutic intervention in order to improve the following deficits and impairments:  Abnormal gait, Decreased balance, Decreased cognition, Decreased mobility, Decreased knowledge of use of DME, Decreased safety awareness, Decreased strength, Difficulty walking, Postural dysfunction, Impaired tone, Increased fascial restricitons  Visit Diagnosis: Muscle weakness (generalized)  Unsteadiness on feet  Other abnormalities of gait and mobility     Problem List Patient Active Problem List   Diagnosis Date Noted  . Seizure-like activity ([place]) 06/28/2016  . Cerebral infarction due to unspecified mechanism   . Gait difficulty   . Memory loss 05/22/2016  . Confusion   . Acute ischemic stroke ([place]) 05/18/2016  . Morbid obesity ([place]) 09/30/2015  . Uncontrolled type 2 diabetes mellitus with circulatory disorder, without long-term current use of insulin ([place])   . Essential hypertension   . Cerebrovascular accident (CVA) due to thrombosis of cerebral artery ([place])   . CVA (cerebral infarction) 08/07/2015  . Pure hypercholesterolemia 10/04/2009   Willow Ora, PTA, [facility] [address] [place], [place]  [address] [phone] 08/09/16, 12:05 PM   Name: ZAHLI VETSCH MRN: [id number] Date of Birth: [birth year]

## 2016-08-10 ENCOUNTER — Encounter: Payer: Self-pay | Admitting: Physical Therapy

## 2016-08-10 ENCOUNTER — Ambulatory Visit: Payer: Commercial Managed Care - HMO | Admitting: Speech Pathology

## 2016-08-10 ENCOUNTER — Ambulatory Visit: Payer: Commercial Managed Care - HMO | Admitting: Physical Therapy

## 2016-08-10 DIAGNOSIS — M6281 Muscle weakness (generalized): Secondary | ICD-10-CM | POA: Diagnosis not present

## 2016-08-10 DIAGNOSIS — R2681 Unsteadiness on feet: Secondary | ICD-10-CM

## 2016-08-10 DIAGNOSIS — R41841 Cognitive communication deficit: Secondary | ICD-10-CM

## 2016-08-10 DIAGNOSIS — R2689 Other abnormalities of gait and mobility: Secondary | ICD-10-CM

## 2016-08-10 NOTE — Therapy (Signed)
[facility] [address] [place], [name], [address] Phone: [phone]   Fax:  [phone]  Speech Language Pathology Treatment  Patient Details  Name: Annette Munoz MRN: [id number] Date of Birth: 15-Feb-1966 Referring Provider: Dr. Lucianne Lei  Encounter Date: 08/10/2016      End of Session - 08/10/16 1215    Visit Number 11   Number of Visits 17   Date for SLP Re-Evaluation 09/08/16   SLP Start Time 1018   SLP Stop Time  1103   SLP Time Calculation (min) 45 min      Past Medical History:  Diagnosis Date  . Diabetes mellitus without complication ([place])   . Hypertension   . Stroke [facility])     Past Surgical History:  Procedure Laterality Date  . CESAREAN SECTION    . TEE WITHOUT CARDIOVERSION N/A 08/10/2015   Procedure: TRANSESOPHAGEAL ECHOCARDIOGRAM (TEE);  Surgeon: [name], MD;  Location: [facility] ENDOSCOPY;  Service: Cardiovascular;  Laterality: N/A;    There were no vitals filed for this visit.      Subjective Assessment - 08/10/16 1029    Subjective Pt brought her binder with her and opened it whe               ADULT SLP TREATMENT - 08/10/16 1157      General Information   Behavior/Cognition Alert;Cooperative;Distractible;Decreased sustained attention;Requires cueing;Impulsive     Treatment Provided   Treatment provided Cognitive-Linquistic     Cognitive-Linquistic Treatment   Treatment focused on Cognition   Skilled Treatment Pt required usual min verbal cues to use phone or calendar in her notebook to answer questions re: date, season, holiday and tx schedule. Pt solved simple/functional  money and tinme math problems presented orally to her with 75% accuracy and usual written cues to recall the details in the questions. I wrote only the numbers, but had her solve the problems with mental math. Short term recall facilitated with having pt sort cards into 3 piles and recall the rule for  each pile with written and picture cues. Despite these cues, pt required usual verbal & questioning cues to recall rules. Daughter present - I added detail direction hw to her packet with sequencing homework and instructed daughter to alternate completing these at home.      Assessment / Recommendations / Plan   Plan Continue with current plan of care     Progression Toward Goals   Progression toward goals Progressing toward goals            SLP Short Term Goals - 08/08/16 1209      SLP SHORT TERM GOAL #1   Title Pt will utilize external aids/memory book to be oriented to date, appointments and daily chores with occasional min A over 3 sessions   Time 1   Period Weeks   Status Not Met     SLP SHORT TERM GOAL #2   Title Pt/family will report use of external aids/memory book at home for ADL's and medication mangement over 3 sessions   Baseline one session 07-21-16, 07-28-16   Time 1   Period Weeks   Status Partially Met     SLP SHORT TERM GOAL #3   Title Pt will verbalize 3 cognitive impairments with mod A   Time 1   Period Weeks   Status Not Met     SLP SHORT TERM GOAL #4   Title Pt will attend to simple cognitive linguistic tasks in  distracting enviroment for 10 mintues with occasional min A   Time 1   Period Weeks   Status Not Met          SLP Long Term Goals - 08/08/16 1210      SLP LONG TERM GOAL #1   Title Pt/family will report use of external memory aids at home with rare min cues, for successful f/u on daily schedule, safety precautions, chores, meds, etc over 3 sessions.   Time 3   Period Weeks   Status Revised     SLP LONG TERM GOAL #2   Title Pt will verbalize 3 safety precautions due to cognitive impairments with mod A.    Time 3   Period Weeks   Status On-going     SLP LONG TERM GOAL #3   Title Pt will solve simple reasoning, organization, time and money problems with 75% accuracy and occasional min A   Time 3   Period Weeks   Status On-going      SLP LONG TERM GOAL #4   Title Pt will demonstrate emergent awareness by using memory compensation during therapy session x2 sessions   Time 3   Period Weeks   Status On-going          Plan - 08/10/16 1202    Clinical Impression Statement Pt continues to make slow gains in attention and problem solving. Progress is affected by severe short term memory  & awareness impairment. Family and pt are demonstrating fair carryover of use of memory binder and calendar at home Continue skilled ST to maximize cognition to reduce caregiver burden.    Speech Therapy Frequency 2x / week   Treatment/Interventions Compensatory strategies;Patient/family education;Functional tasks;Cueing hierarchy;Internal/external aids;Cognitive reorganization;SLP instruction and feedback   Potential to Achieve Goals Fair   Potential Considerations Severity of impairments;Previous level of function   Consulted and Agree with Plan of Care Patient;Family member/caregiver   Family Member Consulted daughter      Patient will benefit from skilled therapeutic intervention in order to improve the following deficits and impairments:   Cognitive communication deficit    Problem List Patient Active Problem List   Diagnosis Date Noted  . Seizure-like activity ([place]) 06/28/2016  . Cerebral infarction due to unspecified mechanism   . Gait difficulty   . Memory loss 05/22/2016  . Confusion   . Acute ischemic stroke ([place]) 05/18/2016  . Morbid obesity ([place]) 09/30/2015  . Uncontrolled type 2 diabetes mellitus with circulatory disorder, without long-term current use of insulin ([place])   . Essential hypertension   . Cerebrovascular accident (CVA) due to thrombosis of cerebral artery ([place])   . CVA (cerebral infarction) 08/07/2015  . Pure hypercholesterolemia 10/04/2009    Issabelle Mcraney, [name] MS, CCC-SLP 08/10/2016, 12:16 PM  [facility] [address] [place], [name], [address] Phone: [phone]   Fax:  [phone]   Name: Annette Munoz MRN: [id number] Date of Birth: 1965/12/26

## 2016-08-11 NOTE — Therapy (Signed)
[facility] [address] [place], [name], [address] Phone: [phone]   Fax:  [phone]  Physical Therapy Treatment  Patient Details  Name: Annette Munoz MRN: [id number] Date of Birth: 08/28/1966 Referring Provider: Rosalin Hawking  Encounter Date: 08/10/2016      PT End of Session - 08/10/16 1108    Visit Number 14   Number of Visits 19   Date for PT Re-Evaluation 08/25/16   Authorization Type U[employer] 60 visit limit combined   PT Start Time 1105   PT Stop Time 1145   PT Time Calculation (min) 40 min   Equipment Utilized During Treatment Gait belt   Activity Tolerance Patient tolerated treatment well   Behavior During Therapy Flat affect      Past Medical History:  Diagnosis Date  . Diabetes mellitus without complication ([place])   . Hypertension   . Stroke [facility])     Past Surgical History:  Procedure Laterality Date  . CESAREAN SECTION    . TEE WITHOUT CARDIOVERSION N/A 08/10/2015   Procedure: TRANSESOPHAGEAL ECHOCARDIOGRAM (TEE);  Surgeon: [name], MD;  Location[facility] ENDOSCOPY;  Service: Cardiovascular;  Laterality: N/A;    There were no vitals filed for this visit.      Subjective Assessment - 08/10/16 1108    Subjective No new complaints. No pain or falls to report.   Patient is accompained by: Family member  daughter   Pertinent History HTN, DM, CVA (x 2), with recent TIA per family report; weakness on R side new since 06/18/16   Patient Stated Goals Pt's goals for therapy are to "do everything I need to do."   Currently in Pain? No/denies   Pain Score 0-No pain           [facility] Adult PT Treatment/Exercise - 08/10/16 1111      Transfers   Transfers Sit to Stand;Stand to Sit   Sit to Stand 5: Supervision;With upper extremity assist;From bed   Sit to Stand Details Verbal cues for technique;Verbal cues for precautions/safety;Verbal cues for safe use of DME/AE   Stand to Sit 5:  Supervision;With upper extremity assist;To bed   Stand to Sit Details (indicate cue type and reason) Verbal cues for technique;Verbal cues for precautions/safety;Verbal cues for safe use of DME/AE     Ambulation/Gait   Ambulation/Gait Yes   Ambulation/Gait Assistance 5: Supervision;4: Min guard   Ambulation/Gait Assistance Details occasional right foot scuffing noted, no loss of balance noted   Ambulation Distance (Feet) 500 Feet   Assistive device Rolling walker   Gait Pattern Step-through pattern;Decreased stride length;Decreased step length - right;Decreased step length - left;Decreased stance time - right;Decreased weight shift to right;Right genu recurvatum;Narrow base of support;Poor foot clearance - right   Ambulation Surface Level;Unlevel;Indoor;Outdoor;Paved             Balance Exercises - 08/10/16 1142      Balance Exercises: Standing   Rockerboard Anterior/posterior;Lateral;Head turns;EO;EC;10 reps;20 seconds   Balance Beam standing with feet across blue foam balance beam: alternating fwd stepping to floor and then back onto foam x 10 reps, alternating backward stepping to floor and back onto foam x 10 reps each leg. min to mod assist for balance, with multimodal cues on ex form and technique                           Balance Exercises: Standing   Rebounder Limitations performed both ways on  balance board: EO rocking board with emphasis on tall posture; holding board steady: EC no head movements, EO head movements up<>down and left<>right progressing toward EC with head movements up<>down and left<>right. min guard to mod assist. multiple balance losses toward left, cues/facilitation needed for equal LE weight bearing, posture and weight shifting to assist with maintaining balance.                                PT Short Term Goals - 07/25/16 2347      PT SHORT TERM GOAL #1   Title Pt/family will verbalize/demo understanding of HEP for strength, balance, transfers,  and gait.  TARGET 07/25/16   Baseline Pt requires assistance of daughter for HEP-daughter verbalizes understanding   Time 4   Period Weeks   Status Achieved     PT SHORT TERM GOAL #2   Title Pt will perform sit<>stand transfers with correct sequence, with supervision, 8 of 10 trials, for improved safety and efficiency with transfers.   Time 4   Period Weeks   Status Achieved     PT SHORT TERM GOAL #3   Title Pt will improve TUG score to less than or equal to 28 seconds for decreased fall risk.   Baseline 29.89 sec at best 07/25/16   Time 4   Period Weeks   Status Not Met     PT SHORT TERM GOAL #4   Title Pt will negotiate at least 4 steps using 1 handrail, with min asistance, for improved safety with home stair negotiation.   Time 4   Period Weeks   Status Achieved     PT SHORT TERM GOAL #5   Title Pt will improve Berg score by at least 5 points (>11/56) for decreased fall risk.   Baseline 27/56 07/25/16   Time 4   Period Weeks   Status Achieved           PT Long Term Goals - 07/25/16 2355      PT LONG TERM GOAL #1   Title Pt/family will verbalize understanding of fall prevention within the home environment.  TARGET 08/25/16   Time 8   Period Weeks   Status New     PT LONG TERM GOAL #2   Title Pt will improve gait velocity to at least 1.3 ft/sec for improved household gait.   Time 8   Period Weeks   Status New     PT LONG TERM GOAL #3   Title Pt will improve TUG score to less than or equal to 20 seconds for decreased fall risk.   Time 8   Period Weeks     PT LONG TERM GOAL #4   Title Pt will improve Berg Balance score to at least 21/56 for decreased fall risk.-MOdified to > 32/56 (07/25/16-AWM)   Time 8   Period Weeks   Status New     PT LONG TERM GOAL #5   Title Pt will ambulate at least 500 ft, indoor/outdoor surfaces with supervision, for improved household and community ambulation.   Time 8   Period Weeks   Status New     PT LONG TERM GOAL #6    Title Pt will negotiate at least 4 steps, 1 rail, with supervision of family, for improved safety with stair negotiation into/out of the home.   Time 8   Period Weeks   Status New  Plan - 08/10/16 1108    Clinical Impression Statement today's skilled session continued to address gait and balance deficits. Pt is making great progress with gait using RW on various surfaces with less cues/assistance needed. Pt does continue to need assistance for balance. Pt should benefit from continued PT to progress toward unmet goals.    Rehab Potential Fair   Clinical Impairments Affecting Rehab Potential Cognition (pt does have family support of husband and daughter)   PT Frequency 2x / week   PT Duration Other (comment)  9 weeks; plus eval   PT Treatment/Interventions ADLs/Self Care Home Management;Functional mobility training;Stair training;Gait training;DME Instruction;Therapeutic activities;Therapeutic exercise;Balance training;Neuromuscular re-education;Patient/family education;Orthotic Fit/Training   PT Next Visit Plan discuss plan of care: pt has 7 total remaining visits due to visit limit- discuss reduction of frequency to 1 x week every other week to spread 2 PT visits out (ST and OT to do the same).  beging checking LTGs as well.    Consulted and Agree with Plan of Care Patient;Family member/caregiver   Family Member Consulted daughter      Patient will benefit from skilled therapeutic intervention in order to improve the following deficits and impairments:  Abnormal gait, Decreased balance, Decreased cognition, Decreased mobility, Decreased knowledge of use of DME, Decreased safety awareness, Decreased strength, Difficulty walking, Postural dysfunction, Impaired tone, Increased fascial restricitons  Visit Diagnosis: Unsteadiness on feet  Other abnormalities of gait and mobility  Muscle weakness (generalized)     Problem List Patient Active Problem List   Diagnosis Date  Noted  . Seizure-like activity ([place]) 06/28/2016  . Cerebral infarction due to unspecified mechanism   . Gait difficulty   . Memory loss 05/22/2016  . Confusion   . Acute ischemic stroke ([place]) 05/18/2016  . Morbid obesity ([place]) 09/30/2015  . Uncontrolled type 2 diabetes mellitus with circulatory disorder, without long-term current use of insulin ([place])   . Essential hypertension   . Cerebrovascular accident (CVA) due to thrombosis of cerebral artery ([place])   . CVA (cerebral infarction) 08/07/2015  . Pure hypercholesterolemia 10/04/2009   Willow Ora, PTA, [facility] [address] [place], [place] [address] [phone] 08/11/16, 10:02 AM   Name: Annette Munoz MRN: [id number] Date of Birth: 1966-09-19

## 2016-08-15 ENCOUNTER — Ambulatory Visit: Payer: Commercial Managed Care - HMO | Admitting: Speech Pathology

## 2016-08-15 DIAGNOSIS — R41841 Cognitive communication deficit: Secondary | ICD-10-CM

## 2016-08-15 DIAGNOSIS — M6281 Muscle weakness (generalized): Secondary | ICD-10-CM | POA: Diagnosis not present

## 2016-08-15 NOTE — Therapy (Signed)
[facility] [address] [place], [name], [address] Phone: [phone]   Fax:  [phone]  Speech Language Pathology Treatment  Patient Details  Name: Annette Munoz MRN: [id number] Date of Birth: 04-18-1966 Referring Provider: Dr. Lucianne Lei  Encounter Date: 08/15/2016      End of Session - 08/15/16 1211    Visit Number 12   Number of Visits 17   Date for SLP Re-Evaluation 09/08/16   SLP Start Time 43   SLP Stop Time  1102   SLP Time Calculation (min) 37 min      Past Medical History:  Diagnosis Date  . Diabetes mellitus without complication ([place])   . Hypertension   . Stroke [facility])     Past Surgical History:  Procedure Laterality Date  . CESAREAN SECTION    . TEE WITHOUT CARDIOVERSION N/A 08/10/2015   Procedure: TRANSESOPHAGEAL ECHOCARDIOGRAM (TEE);  Surgeon: [name], MD;  Location: [facility] ENDOSCOPY;  Service: Cardiovascular;  Laterality: N/A;    There were no vitals filed for this visit.      Subjective Assessment - 08/15/16 1212    Subjective Pt arrived 8 minutes late. Pt oriented to day of week I.                ADULT SLP TREATMENT - 08/15/16 1036      General Information   Behavior/Cognition Alert;Cooperative;Distractible;Decreased sustained attention;Requires cueing;Impulsive     Treatment Provided   Treatment provided Cognitive-Linquistic     Cognitive-Linquistic Treatment   Treatment focused on Cognition   Skilled Treatment Pt. required mod A to ID errors in attention to detail directions. Facilitaed strategies for short term recall of details of visual image using association, repeating salient details, context cues using detailed pictures. Pt recalled details on pictures immediately after, answering detail questions with 85% accuracy. With delay and distraction, pt 70% accurate. Pt verbalizing awareness for need for her therapies due to new CVA. When discussing reaching tx  limit, but again verbalized awareness that she had an "old stroke" and a "new" stroke that were "2 different episodes." with SBA. This is much improved since initiating therapy when she was consistently verbalized shock that she had any strokes.      Assessment / Recommendations / Plan   Plan Other (Comment)  due to reaching visit limits with insur, ST 1-2 more visits      Progression Toward Goals   Progression toward goals Progressing toward goals          SLP Education - 08/15/16 1204    Education provided Yes   Education Details Activities to facilitate short term recall at home   Person(s) Educated Patient;Child(ren)   Methods Explanation;Demonstration;Verbal cues   Comprehension Verbalized understanding;Returned demonstration          SLP Short Term Goals - 08/15/16 1210      SLP SHORT TERM GOAL #1   Title Pt will utilize external aids/memory book to be oriented to date, appointments and daily chores with occasional min A over 3 sessions   Time 1   Period Weeks   Status Not Met     SLP SHORT TERM GOAL #2   Title Pt/family will report use of external aids/memory book at home for ADL's and medication mangement over 3 sessions   Baseline one session 07-21-16, 07-28-16   Time 1   Period Weeks   Status Partially Met     SLP SHORT TERM GOAL #3   Title Pt  will verbalize 3 cognitive impairments with mod A   Time 1   Period Weeks   Status Not Met     SLP SHORT TERM GOAL #4   Title Pt will attend to simple cognitive linguistic tasks in distracting enviroment for 10 mintues with occasional min A   Time 1   Period Weeks   Status Not Met          SLP Long Term Goals - 08/15/16 1210      SLP LONG TERM GOAL #1   Title Pt/family will report use of external memory aids at home with rare min cues, for successful f/u on daily schedule, safety precautions, chores, meds, etc over 3 sessions.   Baseline 08/15/16   Time 2   Period Weeks   Status Revised     SLP LONG TERM  GOAL #2   Title Pt will verbalize 3 safety precautions due to cognitive impairments with mod A.    Time 2   Period Weeks   Status On-going     SLP LONG TERM GOAL #3   Title Pt will solve simple reasoning, organization, time and money problems with 75% accuracy and occasional min A   Time 2   Period Weeks   Status On-going     SLP LONG TERM GOAL #4   Title Pt will demonstrate emergent awareness by using memory compensation during therapy session x2 sessions   Baseline 08/15/16   Time 2   Period Weeks   Status On-going          Plan - 08/15/16 1205    Clinical Impression Statement Improvement noted in awareness of benefit of therapies and h/o mulitple CVA's. Orientation and short term recall continue to improve with min to mod A. I recommned pt continue skilled ST to maximize independence and carryover of compensations for cognitive impairments. However, pt reaching max number of visits with insurance plan. Therefore, will plan to continue ST 1-2 more visits. Family provided [facility] accounting info and info on [employer].   Speech Therapy Frequency 2x / week   Treatment/Interventions Compensatory strategies;Patient/family education;Functional tasks;Cueing hierarchy;Internal/external aids;Cognitive reorganization;SLP instruction and feedback   Potential to Achieve Goals Fair   Potential Considerations Severity of impairments;Previous level of function   Consulted and Agree with Plan of Care Patient;Family member/caregiver   Family Member Consulted daughter      Patient will benefit from skilled therapeutic intervention in order to improve the following deficits and impairments:   Cognitive communication deficit    Problem List Patient Active Problem List   Diagnosis Date Noted  . Seizure-like activity ([place]) 06/28/2016  . Cerebral infarction due to unspecified mechanism   . Gait difficulty   . Memory loss 05/22/2016  . Confusion   . Acute ischemic stroke ([place]) 05/18/2016  .  Morbid obesity ([place]) 09/30/2015  . Uncontrolled type 2 diabetes mellitus with circulatory disorder, without long-term current use of insulin ([place])   . Essential hypertension   . Cerebrovascular accident (CVA) due to thrombosis of cerebral artery ([place])   . CVA (cerebral infarction) 08/07/2015  . Pure hypercholesterolemia 10/04/2009    Demaurion Dicioccio, [name] MS, CCC-SLP 08/15/2016, 12:13 PM  [facility] [address] [place], [name], [address] Phone: [phone]   Fax:  [phone]   Name: Annette Munoz MRN: [id number] Date of Birth: 09-01-1966

## 2016-08-16 ENCOUNTER — Ambulatory Visit: Payer: Commercial Managed Care - HMO | Admitting: Physical Therapy

## 2016-08-16 ENCOUNTER — Ambulatory Visit: Payer: Commercial Managed Care - HMO | Admitting: Occupational Therapy

## 2016-08-17 ENCOUNTER — Ambulatory Visit: Payer: Commercial Managed Care - HMO | Admitting: Physical Therapy

## 2016-08-17 ENCOUNTER — Encounter: Payer: Self-pay | Admitting: Physical Therapy

## 2016-08-17 ENCOUNTER — Ambulatory Visit: Payer: Commercial Managed Care - HMO | Admitting: Occupational Therapy

## 2016-08-17 ENCOUNTER — Ambulatory Visit: Payer: Commercial Managed Care - HMO | Attending: Family Medicine | Admitting: Speech Pathology

## 2016-08-17 DIAGNOSIS — I69315 Cognitive social or emotional deficit following cerebral infarction: Secondary | ICD-10-CM | POA: Insufficient documentation

## 2016-08-17 DIAGNOSIS — R41841 Cognitive communication deficit: Secondary | ICD-10-CM | POA: Diagnosis not present

## 2016-08-17 DIAGNOSIS — R2681 Unsteadiness on feet: Secondary | ICD-10-CM | POA: Diagnosis present

## 2016-08-17 DIAGNOSIS — R2689 Other abnormalities of gait and mobility: Secondary | ICD-10-CM

## 2016-08-17 DIAGNOSIS — M6281 Muscle weakness (generalized): Secondary | ICD-10-CM | POA: Diagnosis present

## 2016-08-17 DIAGNOSIS — R278 Other lack of coordination: Secondary | ICD-10-CM

## 2016-08-17 NOTE — Therapy (Signed)
[facility] [address] [place], [name], [address] Phone: [phone]   Fax:  [phone]  Speech Language Pathology Treatment  Patient Details  Name: Annette Munoz MRN: [id number] Date of Birth: February 02, 1966 Referring Provider: Dr. Lucianne Lei  Encounter Date: 08/17/2016      End of Session - 08/17/16 1104    Visit Number 13   Number of Visits 17   Date for SLP Re-Evaluation 09/08/16   SLP Start Time 1017   SLP Stop Time  1104   SLP Time Calculation (min) 47 min      Past Medical History:  Diagnosis Date  . Diabetes mellitus without complication ([place])   . Hypertension   . Stroke [facility])     Past Surgical History:  Procedure Laterality Date  . CESAREAN SECTION    . TEE WITHOUT CARDIOVERSION N/A 08/10/2015   Procedure: TRANSESOPHAGEAL ECHOCARDIOGRAM (TEE);  Surgeon: [name], MD;  Location: [facility] ENDOSCOPY;  Service: Cardiovascular;  Laterality: N/A;    There were no vitals filed for this visit.      Subjective Assessment - 08/17/16 1022    Subjective Daughter reports insurance may require auth for additional visits this calendar year   Special Tests daughter   Currently in Pain? No/denies               ADULT SLP TREATMENT - 08/17/16 1028      General Information   Behavior/Cognition Alert;Cooperative;Distractible;Decreased sustained attention;Requires cueing;Impulsive     Treatment Provided   Treatment provided Cognitive-Linquistic     Cognitive-Linquistic Treatment   Treatment focused on Cognition   Skilled Treatment Simple functional (time and money) problem solving with extended time, occasional min verbal cues - 85%accuracy. Attention to detail and alternating attention reading water park schedule and lesson options and answering time word problems re: schedule with occasional min cues for attention to  details - simple math 80% accuracy for simple 1 step word problems.     Assessment / Recommendations / Plan   Plan Continue with current plan of care     Progression Toward Goals   Progression toward goals Progressing toward goals            SLP Short Term Goals - 08/17/16 1100      SLP SHORT TERM GOAL #1   Title Pt will utilize external aids/memory book to be oriented to date, appointments and daily chores with occasional min A over 3 sessions   Time 1   Period Weeks   Status Not Met     SLP SHORT TERM GOAL #2   Title Pt/family will report use of external aids/memory book at home for ADL's and medication mangement over 3 sessions   Baseline one session 07-21-16, 07-28-16   Time 1   Period Weeks   Status Partially Met     SLP SHORT TERM GOAL #3   Title Pt will verbalize 3 cognitive impairments with mod A   Time 1   Period Weeks   Status Not Met     SLP SHORT TERM GOAL #4   Title Pt will attend to simple cognitive linguistic tasks in distracting enviroment for 10 mintues with occasional min A   Time 1   Period Weeks   Status Not Met          SLP Long Term Goals - 08/17/16 1100      SLP LONG TERM GOAL #1   Title Pt/family will report use of external memory  aids at home with rare min cues, for successful f/u on daily schedule, safety precautions, chores, meds, etc over 3 sessions.   Baseline 08/15/16, 11/2   Time 2   Period Weeks   Status Revised     SLP LONG TERM GOAL #2   Title Pt will verbalize 3 safety precautions due to cognitive impairments with mod A.    Time 2   Period Weeks   Status On-going     SLP LONG TERM GOAL #3   Title Pt will solve simple reasoning, organization, time and money problems with 75% accuracy and occasional min A   Time 2   Period Weeks   Status On-going     SLP LONG TERM GOAL #4   Title Pt will demonstrate emergent awareness by using memory compensation during therapy session x2 sessions   Baseline 08/15/16   Time 2   Period Weeks   Status On-going          Plan - 08/17/16 1054     Clinical Impression Statement Pt contunues to demonstrate imrprovement in selective attention, simple functional problem solving, emerging intellectual awareness. Recommend continue skilled ST to maximize cognition to reduce caregiver burden and improve independence participating in family life.   Speech Therapy Frequency 2x / week   Treatment/Interventions Compensatory strategies;Patient/family education;Functional tasks;Cueing hierarchy;Internal/external aids;Cognitive reorganization;SLP instruction and feedback   Potential to Achieve Goals Fair   Potential Considerations Severity of impairments;Previous level of function   Consulted and Agree with Plan of Care Patient;Family member/caregiver   Family Member Consulted daughter      Patient will benefit from skilled therapeutic intervention in order to improve the following deficits and impairments:   Cognitive communication deficit    Problem List Patient Active Problem List   Diagnosis Date Noted  . Seizure-like activity ([place]) 06/28/2016  . Cerebral infarction due to unspecified mechanism   . Gait difficulty   . Memory loss 05/22/2016  . Confusion   . Acute ischemic stroke ([place]) 05/18/2016  . Morbid obesity ([place]) 09/30/2015  . Uncontrolled type 2 diabetes mellitus with circulatory disorder, without long-term current use of insulin ([place])   . Essential hypertension   . Cerebrovascular accident (CVA) due to thrombosis of cerebral artery ([place])   . CVA (cerebral infarction) 08/07/2015  . Pure hypercholesterolemia 10/04/2009    Jaylia Pettus, [name] MS, CCC-SLP 08/17/2016, 11:04 AM  [facility] [address] [place], [name], [address] Phone: [phone]   Fax:  [phone]   Name: Annette Munoz MRN: [id number] Date of Birth: [birth year]

## 2016-08-17 NOTE — Therapy (Signed)
[facility] [address] [place], [name], [address] Phone: [phone]   Fax:  [phone]  Occupational Therapy Treatment  Patient Details  Name: Annette Munoz MRN: [id number] Date of Birth: 03-Jul-1966 Referring Provider: Veneda Melter. Candiss Norse  Encounter Date: 08/17/2016      OT End of Session - 08/17/16 1116    Visit Number 13   Number of Visits 16   Date for OT Re-Evaluation 08/22/16   Authorization Type [employer] H[employer] 60 Visit Limit Combined (OT eval +14 speech treatments earlier this year) as of 10/15/16 insurance reported 57 visits used this year (evals not included)   Authorization - Visit Number 13   Authorization - Number of Visits 16  POC   OT Start Time 1104   OT Stop Time 1145   OT Time Calculation (min) 41 min   Activity Tolerance Patient tolerated treatment well   Behavior During Therapy WFL for tasks assessed/performed      Past Medical History:  Diagnosis Date  . Diabetes mellitus without complication ([place])   . Hypertension   . Stroke [facility])     Past Surgical History:  Procedure Laterality Date  . CESAREAN SECTION    . TEE WITHOUT CARDIOVERSION N/A 08/10/2015   Procedure: TRANSESOPHAGEAL ECHOCARDIOGRAM (TEE);  Surgeon: [name], MD;  Location: [facility] ENDOSCOPY;  Service: Cardiovascular;  Laterality: N/A;    There were no vitals filed for this visit.      Subjective Assessment - 08/17/16 1106    Subjective  Daughter reports that she braided her hair and that she performed shower transfer with supervision   Patient is accompained by: Family member  Daughter   Currently in Pain? No/denies         Placing grooved pegs in pegboard for incr coordination/in-hand manipulation with min difficulty and cues to avoid proximal compensation.  Dtr. Reports that pt is using RUE as dominant UE approx 50% of the time with reminders.   Completing 24 piece puzzle with  min cueing during task to scan to the right and to use colors as a guide and mod cueing initially to use strategy for problem solving (seperate and complete edge pieces first).   1M number cancellation sheet with 100% accuracy with incr time.     Environmental scanning in quiet environment with 10/13 items found (approx 77%) with all items missed on R side.  Pt also bumped into chair on R side with her walker.  Educated pt/dtr. Regarding safety concerns with decr environmental scanning and importance of supervision, particularly in unfamiliar or busy environments and how to encourage environmental scanning with close supervision.                 OT Education - 08/17/16 1113    Education Details Encouraged pt to perform cold snack prep with supervision at home for safety/balance (make sandwich, stir cold food, wash vegetables), and recommended walker basket/tray for incr safety with carrying objects   Person(s) Educated Patient;Child(ren)   Methods Explanation   Comprehension Verbalized understanding          OT Short Term Goals - 08/17/16 1121      OT SHORT TERM GOAL #1   Title Pt will be Mod I signs and symtpoms of CVA/TIA   Status Deferred     OT SHORT TERM GOAL #2   Title Pt will be Mod I home program RUE  supervision and mod cueing   Status Achieved  07/25/16  supervision and min cueing     OT SHORT TERM GOAL #3   Title Pt/family will I state 2-3 pieces of a/e &/or DME for home use for increased independnece and safety   Status On-going  08/17/16  use of RW and walker basket/tray     OT SHORT TERM GOAL #4   Title Patient will dress lower body with mod cueing and intermittent mod assistance following set up.   Time 4   Period Weeks   Status Achieved  07/17/16:  min-mod A per dtr report     OT SHORT TERM GOAL #5   Title Patient will dress upper body with set up and minimal assistance   Time 4   Period Weeks   Status Achieved  07/17/16: per dtr report            OT Long Term Goals - 08/17/16 1109      OT LONG TERM GOAL #1   Title Pt will be Min A LB/UB Dressing and ADL's   Baseline Mod-max A   Time 8   Period Weeks   Status Achieved     OT LONG TERM GOAL #2   Title Pt will be Mod I-supervision updated HEP RUE   Time 8   Period Weeks   Status On-going     OT LONG TERM GOAL #3   Title Pt will demonstrate improved RUE strength as seen by 3+/5 MMT   Baseline -3/5 RUE   Time 8   Period Weeks   Status On-going     OT LONG TERM GOAL #4   Title Pt will demonstrate improved R hand coordination and grip strength as seen by improved Box & Blocks to 30 or greater and grip 30# or greater R.   Baseline Grip 19#; Box and Blocks = 13 (06/27/16)   Time 8   Period Weeks   Status On-going     OT LONG TERM GOAL #5   Title Pt will demonstrate improved functional mobility and balance as seen by supervision assist for simulated shower transfers   Baseline Moderate (06/27/16)   Time 8   Period Weeks   Status Achieved  08/17/16  Met per daughter                Plan - 08/17/16 1123    Clinical Impression Statement Pt progressing with coordination with decr compensation, but memory/cognitive deficits continue to limit incr IADL participation and safety.   Rehab Potential Fair   Clinical Impairments Affecting Rehab Potential severity of deficits; cognitive deficits   OT Frequency 2x / week   OT Duration 8 weeks   OT Treatment/Interventions Self-care/ADL training;Therapeutic exercise;DME and/or AE instruction;Therapeutic activities;Patient/family education;Balance training;Neuromuscular education;Energy conservation;Passive range of motion;Therapeutic exercises;Visual/perceptual remediation/compensation   Plan check LTGs and anticipate renewal next week (as dtr reports desire to continue despite visit limits), environmental scanning, safety, simple snack prep   OT Home Exercise Plan 07/10/16:  cane and simple coordination HEP;  recommendations for cutting with scissors, braiding, stringing beads   Consulted and Agree with Plan of Care Patient;Family member/caregiver   Family Member Consulted Daughter      Patient will benefit from skilled therapeutic intervention in order to improve the following deficits and impairments:  Decreased balance, Decreased endurance, Impaired vision/preception, Decreased range of motion, Decreased knowledge of precautions, Decreased cognition, Decreased activity tolerance, Decreased coordination, Decreased knowledge of use of DME, Decreased safety awareness, Decreased strength, Impaired flexibility, Impaired UE functional use, Decreased mobility, Difficulty walking  Visit  Diagnosis: Cognitive social or emotional deficit following cerebral infarction  Other lack of coordination  Other abnormalities of gait and mobility    Problem List Patient Active Problem List   Diagnosis Date Noted  . Seizure-like activity ([place]) 06/28/2016  . Cerebral infarction due to unspecified mechanism   . Gait difficulty   . Memory loss 05/22/2016  . Confusion   . Acute ischemic stroke ([place]) 05/18/2016  . Morbid obesity ([place]) 09/30/2015  . Uncontrolled type 2 diabetes mellitus with circulatory disorder, without long-term current use of insulin ([place])   . Essential hypertension   . Cerebrovascular accident (CVA) due to thrombosis of cerebral artery ([place])   . CVA (cerebral infarction) 08/07/2015  . Pure hypercholesterolemia 10/04/2009    [facility] 08/17/2016, 12:51 PM  [facility] [address] [place], [name], [address] Phone: [phone]   Fax:  [phone]  Name: Annette Munoz MRN: [id number] Date of Birth: [birth year]   Vianne Bulls, OTR/L [facility] [address] [place], [place]  [address] [phone] phone [phone] 08/17/16 12:51 PM

## 2016-08-17 NOTE — Patient Instructions (Addendum)
Piriformis Stretch, Sitting    Sit, right ankle on opposite knee, same-side hand on crossed knee. Have hands on right leg to hold it in place with gentle overpressure at right knee (pressing down). Lean forward until stretch is felt. Hold _20__ seconds.  Repeat _3_ times per session. Do _1-2_ sessions per day.  Copyright  VHI. All rights reserved.  Piriformis Stretch, Supine    Lie supine, one ankle crossed onto opposite knee. Holding bottom leg behind knee, gently pull legs toward chest until stretch is felt in buttock of top leg. Hold ___ seconds. For deeper stretch gently push top knee away from body.  Repeat ___ times per session. Do ___ sessions per day.  Copyright  VHI. All rights reserved.  Hip Flexor Stretch    Lying on back near edge of bed, bend one leg, foot flat. Hang other leg over edge, relaxed, thigh resting entirely on bed for ____ minutes. Repeat ____ times. Do ____ sessions per day. Advanced Exercise: Bend knee back keeping thigh in contact with bed.  http://[email or web address].exer.us/347   Copyright  VHI. All rights reserved.

## 2016-08-18 NOTE — Therapy (Signed)
[facility] [address] [place], [name], [address] Phone: [phone]   Fax:  [phone]  Physical Therapy Treatment  Patient Details  Name: Annette Munoz MRN: [id number] Date of Birth: Aug 24, 1966 Referring Provider: Rosalin Hawking  Encounter Date: 08/17/2016      PT End of Session - 08/17/16 0948    Visit Number 15   Number of Visits 19   Date for PT Re-Evaluation 08/25/16   Authorization Type [employer] 60 visit limit combined   PT Start Time 0936  pt late for appt   PT Stop Time 1015   PT Time Calculation (min) 39 min   Equipment Utilized During Treatment Gait belt   Activity Tolerance Patient tolerated treatment well   Behavior During Therapy Flat affect      Past Medical History:  Diagnosis Date  . Diabetes mellitus without complication ([place])   . Hypertension   . Stroke [facility])     Past Surgical History:  Procedure Laterality Date  . CESAREAN SECTION    . TEE WITHOUT CARDIOVERSION N/A 08/10/2015   Procedure: TRANSESOPHAGEAL ECHOCARDIOGRAM (TEE);  Surgeon: [name], MD;  Location: [facility] ENDOSCOPY;  Service: Cardiovascular;  Laterality: N/A;    There were no vitals filed for this visit.      Subjective Assessment - 08/17/16 0947    Subjective No new complaints. No pain or falls to report. does report some hip pain/tightness when lifting her foot up to the other leg to put on socks/shoes.   Patient is accompained by: Family member  daughter   Pertinent History HTN, DM, CVA (x 2), with recent TIA per family report; weakness on R side new since 06/18/16   Patient Stated Goals Pt's goals for therapy are to "do everything I need to do."   Currently in Pain? No/denies   Pain Score 0-No pain     issued the following to HEP to address hip pain/tightness with ADLs: cues needed for correct ex form and technique. Daughter educated as well on how to assist/cue mom at home.  Piriformis Stretch, Sitting    Sit,  right ankle on opposite knee, same-side hand on crossed knee. Have hands on right leg to hold it in place with gentle overpressure at right knee (pressing down). Lean forward until stretch is felt. Hold _20__ seconds.  Repeat _3_ times per session. Do _1-2_ sessions per day.  Copyright  VHI. All rights reserved.  Piriformis Stretch, Supine    Lie supine, one ankle crossed onto opposite knee. Holding bottom leg behind knee, gently pull legs toward chest until stretch is felt in buttock of top leg. Hold ___ seconds. For deeper stretch gently push top knee away from body.  Repeat ___ times per session. Do ___ sessions per day.  Copyright  VHI. All rights reserved.  Hip Flexor Stretch    Lying on back near edge of bed, bend one leg, foot flat. Hang other leg over edge, relaxed, thigh resting entirely on bed for ____ minutes. Repeat ____ times. Do ____ sessions per day. Advanced Exercise: Bend knee back keeping thigh in contact with bed.  http://[email or web address].exer.us/347   Copyright  VHI. All rights reserved.           [facility] Adult PT Treatment/Exercise - 08/17/16 1006      Transfers   Transfers Sit to Stand;Stand to Sit   Sit to Stand 5: Supervision;With upper extremity assist;From bed   Sit to Stand Details Verbal cues for technique;Verbal cues for  precautions/safety;Verbal cues for safe use of DME/AE   Stand to Sit 5: Supervision;With upper extremity assist;To bed   Stand to Sit Details (indicate cue type and reason) Verbal cues for technique;Verbal cues for precautions/safety;Verbal cues for safe use of DME/AE     Ambulation/Gait   Ambulation/Gait Yes   Ambulation/Gait Assistance 4: Min guard;4: Min assist   Ambulation/Gait Assistance Details cues needed for sequencing with cane, cane placement, to keep cane on ground and for increased right foot clearance with swing phase, increased cues/assist for balance needed as pt fatigued. Reinforced cane with gait only in PT for now, pt is not  safe to use one at home with family at this time. Pt/daughter verbalized understanding.                           Ambulation Distance (Feet) 115 Feet  x 2 reps   Assistive device Straight cane  with tripod tip due to quad rubber tip not available   Gait Pattern Step-through pattern;Decreased stride length;Decreased step length - right;Decreased step length - left;Decreased stance time - right;Decreased weight shift to right;Right genu recurvatum;Narrow base of support;Poor foot clearance - right   Ambulation Surface Level;Indoor           PT Education - 08/17/16 1037    Education provided Yes   Education Details HEP for right hip/LE stretching   Person(s) Educated Patient;Child(ren)   Methods Explanation;Demonstration;Verbal cues;Handout   Comprehension Verbalized understanding;Returned demonstration;Verbal cues required;Need further instruction          PT Short Term Goals - 07/25/16 2347      PT SHORT TERM GOAL #1   Title Pt/family will verbalize/demo understanding of HEP for strength, balance, transfers, and gait.  TARGET 07/25/16   Baseline Pt requires assistance of daughter for HEP-daughter verbalizes understanding   Time 4   Period Weeks   Status Achieved     PT SHORT TERM GOAL #2   Title Pt will perform sit<>stand transfers with correct sequence, with supervision, 8 of 10 trials, for improved safety and efficiency with transfers.   Time 4   Period Weeks   Status Achieved     PT SHORT TERM GOAL #3   Title Pt will improve TUG score to less than or equal to 28 seconds for decreased fall risk.   Baseline 29.89 sec at best 07/25/16   Time 4   Period Weeks   Status Not Met     PT SHORT TERM GOAL #4   Title Pt will negotiate at least 4 steps using 1 handrail, with min asistance, for improved safety with home stair negotiation.   Time 4   Period Weeks   Status Achieved     PT SHORT TERM GOAL #5   Title Pt will improve Berg score by at least 5 points (>11/56) for  decreased fall risk.   Baseline 27/56 07/25/16   Time 4   Period Weeks   Status Achieved           PT Long Term Goals - 07/25/16 2355      PT LONG TERM GOAL #1   Title Pt/family will verbalize understanding of fall prevention within the home environment.  TARGET 08/25/16   Time 8   Period Weeks   Status New     PT LONG TERM GOAL #2   Title Pt will improve gait velocity to at least 1.3 ft/sec for improved household gait.   Time 8  Period Weeks   Status New     PT LONG TERM GOAL #3   Title Pt will improve TUG score to less than or equal to 20 seconds for decreased fall risk.   Time 8   Period Weeks     PT LONG TERM GOAL #4   Title Pt will improve Berg Balance score to at least 21/56 for decreased fall risk.-MOdified to > 32/56 (07/25/16-AWM)   Time 8   Period Weeks   Status New     PT LONG TERM GOAL #5   Title Pt will ambulate at least 500 ft, indoor/outdoor surfaces with supervision, for improved household and community ambulation.   Time 8   Period Weeks   Status New     PT LONG TERM GOAL #6   Title Pt will negotiate at least 4 steps, 1 rail, with supervision of family, for improved safety with stair negotiation into/out of the home.   Time 8   Period Weeks   Status New               Plan - 08/17/16 0948    Clinical Impression Statement During today's skilled session added stretching of right LE to HEP to address hip pain/tightness. Remainder of session addressed gait with cane per pt/daughter request to continue to work on this. Pt verbalized increased fatigue with use of gait. Pt continues to need min assist for balance with gait using cane as well. Reinforced use of RW only outside of PT due to fall risk and assistance needed with lesser AD's. Pt/daughter verbalized understanding. Lengthy discussion on visit limits with [facility] insurance as well this session. Pt/daughter wish to proceed with therapies using the total visit number given by the insurance  company as having been used, leaving them with 10 visits after all 3 diciplines see her today. They also wish to seek prior authorization for additional visits. Will have our insurance specialist check into this. Pt should benefit from continued PT to progress toward unmet goals.                         Rehab Potential Fair   Clinical Impairments Affecting Rehab Potential Cognition (pt does have family support of husband and daughter)   PT Frequency 2x / week   PT Duration Other (comment)  9 weeks; plus eval   PT Treatment/Interventions ADLs/Self Care Home Management;Functional mobility training;Stair training;Gait training;DME Instruction;Therapeutic activities;Therapeutic exercise;Balance training;Neuromuscular re-education;Patient/family education;Orthotic Fit/Training   PT Next Visit Plan continue to monitor insurance visits/preparing for discharge as needed due to visist limit;continue to work on balance and gait with LRAD    Consulted and Agree with Plan of Care Patient;Family member/caregiver   Family Member Consulted daughter      Patient will benefit from skilled therapeutic intervention in order to improve the following deficits and impairments:  Abnormal gait, Decreased balance, Decreased cognition, Decreased mobility, Decreased knowledge of use of DME, Decreased safety awareness, Decreased strength, Difficulty walking, Postural dysfunction, Impaired tone, Increased fascial restricitons  Visit Diagnosis: Unsteadiness on feet  Other abnormalities of gait and mobility  Muscle weakness (generalized)     Problem List Patient Active Problem List   Diagnosis Date Noted  . Seizure-like activity ([place]) 06/28/2016  . Cerebral infarction due to unspecified mechanism   . Gait difficulty   . Memory loss 05/22/2016  . Confusion   . Acute ischemic stroke ([place]) 05/18/2016  . Morbid obesity ([place]) 09/30/2015  . Uncontrolled type 2  diabetes mellitus with circulatory disorder, without  long-term current use of insulin ([place])   . Essential hypertension   . Cerebrovascular accident (CVA) due to thrombosis of cerebral artery ([place])   . CVA (cerebral infarction) 08/07/2015  . Pure hypercholesterolemia 10/04/2009    Willow Ora, PTA, [facility] [address], [place], [place] [phone] 08/18/16, 10:38 AM   Name: SABRA SESSLER MRN: [id number] Date of Birth: 11-15-1965

## 2016-08-22 ENCOUNTER — Ambulatory Visit: Payer: Commercial Managed Care - HMO | Admitting: Speech Pathology

## 2016-08-22 ENCOUNTER — Ambulatory Visit: Payer: Commercial Managed Care - HMO | Admitting: Physical Therapy

## 2016-08-22 ENCOUNTER — Ambulatory Visit: Payer: Commercial Managed Care - HMO | Admitting: *Deleted

## 2016-08-24 ENCOUNTER — Ambulatory Visit: Payer: Commercial Managed Care - HMO | Admitting: Physical Therapy

## 2016-08-24 ENCOUNTER — Encounter: Payer: Commercial Managed Care - HMO | Admitting: *Deleted

## 2016-08-24 DIAGNOSIS — Z0271 Encounter for disability determination: Secondary | ICD-10-CM

## 2016-08-29 ENCOUNTER — Ambulatory Visit: Payer: Commercial Managed Care - HMO | Admitting: Physical Therapy

## 2016-08-29 ENCOUNTER — Encounter: Payer: Self-pay | Admitting: Occupational Therapy

## 2016-08-29 ENCOUNTER — Ambulatory Visit: Payer: Commercial Managed Care - HMO | Admitting: Occupational Therapy

## 2016-08-29 NOTE — Therapy (Signed)
[facility] [address] [place], [name], [address] Phone: [phone]   Fax:  [phone]  Patient Details  Name: Annette Munoz MRN: [id number] Date of Birth: 1966-03-05 Referring Provider:  No ref. provider found  Encounter Date: 08/29/2016  Re:  Request for Additional Occupational Therapy Visits    To whom it may concern:  Atara Paterson is a 50 y.o. Female currently being seen by occupational therapy, physical therapy, and speech therapy following CVA in August 2017. Unfortunately, this was her third CVA.   Since Mrs. Empie had received speech therapy/occupational therapy earlier this year after a previous CVA, she has met her visit limit for the year.  Mrs. Rouch was evaluated after the most recent CVA by OT on 06/27/16 and seen for 13 visits (including evaluation).  Mrs. Mcguinness and her family are desire to continue with occupational therapy; therefore, this letter is to request additional occupational therapy visits in order to improve right upper extremity functional use, safety, coordination, and participation in ADLs.   Mrs. Stroebel has made significant progress with improved attention to the right side, improved dominant right upper extremity functional use (using approximately 50% of the time per family support), improved coordination, improved strength, improved ADL participation (performing BADLs with supervision per family report).    Mrs. Doe continues to demo significant cognitive/visual perceptual deficits and right inattention, which are barriers affecting safety and ADL/IADL performance.  However, as Mrs. Cimmino has been making steady progress, she would benefit from further occupational therapy with goals for increasing dominant functional use, improving safety and increased participation for ADLs/IADLs, and improved visual scanning and attention to the right side/functional tasks for improved safety for improved  quality of life and decreased caregiver burden.  Mrs. Lebron has good family support with consistent attendance and family presence during therapy sessions to increase functional carryover at home.  Mrs. Curnutt's current plan of care is due for renewal and long term goal check with likely recommendation for occupational therapy for 2x/week for 4-6 weeks to continue to address the above deficits.      Sincerely,     [facility], OTR/L 08/29/2016, 4:46 PM  [facility] [address] [place], [name], [address] Phone: [phone]   Fax:  [phone]

## 2016-08-31 ENCOUNTER — Ambulatory Visit: Payer: Commercial Managed Care - HMO | Admitting: Physical Therapy

## 2016-08-31 ENCOUNTER — Encounter: Payer: Commercial Managed Care - HMO | Admitting: Occupational Therapy

## 2016-09-21 ENCOUNTER — Encounter: Payer: Self-pay | Admitting: Speech Pathology

## 2016-09-21 NOTE — Therapy (Signed)
[facility] [address] [place], [name], [address] Phone: [phone]   Fax:  [phone]  Patient Details  Name: Annette Munoz MRN: [id number] Date of Birth: 07/04/1966 Referring Provider:  No ref. provider found  Encounter Date: 09/21/2016   SPEECH THERAPY DISCHARGE SUMMARY  Visits from Start of Care: 13  Current functional level related to goals / functional outcomes: See goals below   Remaining deficits: Severe to profound memory impairment, severe attention, awareness, executive function impairments Pt did not return to therapy due to meeting max number of visits for insurance coverage   Education / Equipment: Compensations for cognitive impairments, CVA education Plan: Patient agrees to discharge.  Patient goals were not met. Patient is being discharged due to financial reasons.  ?????                SLP Long Term Goals - 08/17/16 1100      SLP LONG TERM GOAL #1   Title Pt/family will report use of external memory aids at home with rare min cues, for successful f/u on daily schedule, safety precautions, chores, meds, etc over 3 sessions.   Baseline 08/15/16, 11/2   Time 2   Period Weeks   Status Not met     SLP LONG TERM GOAL #2   Title Pt will verbalize 3 safety precautions due to cognitive impairments with mod A.    Time 2   Period Weeks   Status Not met     SLP LONG TERM GOAL #3   Title Pt will solve simple reasoning, organization, time and money problems with 75% accuracy and occasional min A   Time 2   Period Weeks   Status Not met     SLP LONG TERM GOAL #4   Title Pt will demonstrate emergent awareness by using memory compensation during therapy session x2 sessions   Baseline 08/15/16   Time 2   Period Weeks   Status Not met      [place], [name] MS, [place] 09/21/2016, 11:45 AM  [facility] [address] [place], [name], [address] Phone: [phone]   Fax:  [phone]

## 2016-09-27 ENCOUNTER — Ambulatory Visit: Payer: Commercial Managed Care - HMO | Attending: Family Medicine

## 2016-09-27 ENCOUNTER — Ambulatory Visit: Payer: Commercial Managed Care - HMO | Admitting: Occupational Therapy

## 2016-09-27 DIAGNOSIS — M6281 Muscle weakness (generalized): Secondary | ICD-10-CM | POA: Insufficient documentation

## 2016-09-27 DIAGNOSIS — R41842 Visuospatial deficit: Secondary | ICD-10-CM

## 2016-09-27 DIAGNOSIS — R41841 Cognitive communication deficit: Secondary | ICD-10-CM | POA: Diagnosis present

## 2016-09-27 DIAGNOSIS — R2681 Unsteadiness on feet: Secondary | ICD-10-CM | POA: Insufficient documentation

## 2016-09-27 DIAGNOSIS — R278 Other lack of coordination: Secondary | ICD-10-CM | POA: Insufficient documentation

## 2016-09-27 DIAGNOSIS — R2689 Other abnormalities of gait and mobility: Secondary | ICD-10-CM | POA: Insufficient documentation

## 2016-09-27 DIAGNOSIS — I69315 Cognitive social or emotional deficit following cerebral infarction: Secondary | ICD-10-CM | POA: Insufficient documentation

## 2016-09-27 NOTE — Patient Instructions (Signed)
Do one crossword puzzle each day.   Please complete the assigned speech therapy homework and return it to your next session.

## 2016-09-27 NOTE — Therapy (Signed)
[facility] [address] [place], [name], [address] Phone: [phone]   Fax:  [phone]  Occupational Therapy Treatment  Patient Details  Name: Annette Munoz MRN: [id number] Date of Birth: 05/14/1966 Referring Provider: Veneda Melter. Candiss Norse  Encounter Date: 09/27/2016      OT End of Session - 09/27/16 1528    Visit Number 14   Number of Visits 30   Date for OT Re-Evaluation 11/26/16   Authorization Type [employer] 60 Visit Limit Combined (OT eval +14 speech treatments earlier this year) as of 10/15/16 insurance reported 48 visits used this year (evals not included)   Authorization Time Period Pt 9 daughter says insurance told her that pt had 18 more visits for the year, therapist conveyed that this is not consistnet with the information to therapist, pt and dtr would like to continue therapy anyway.   OT Start Time 1150   OT Stop Time 1230   OT Time Calculation (min) 40 min   Activity Tolerance Patient tolerated treatment well   Behavior During Therapy WFL for tasks assessed/performed      Past Medical History:  Diagnosis Date  . Diabetes mellitus without complication ([place])   . Hypertension   . Stroke [facility])     Past Surgical History:  Procedure Laterality Date  . CESAREAN SECTION    . TEE WITHOUT CARDIOVERSION N/A 08/10/2015   Procedure: TRANSESOPHAGEAL ECHOCARDIOGRAM (TEE);  Surgeon: [name], MD;  Location: [facility] ENDOSCOPY;  Service: Cardiovascular;  Laterality: N/A;    There were no vitals filed for this visit.      Subjective Assessment - 09/27/16 1154    Subjective  Pt reports she's been walking at home   Patient Stated Goals to get better, memory, focusing   Currently in Pain? No/denies         Pt/ daughter arrived reporting that insurance told her that pt has 18 more visits remaining for the year. Pt's daughter was made aware that this information is not  consitent with the information that the therapist has. Pt/ and dtr wish to continue therapy anyway. Therapist checked progress towards goals and assessed environmental, tabletop scanning and 9 hole peg test. See goals for baseline measurements.                      OT Short Term Goals - 09/27/16 1158      OT SHORT TERM GOAL #1   Title Pt will be Mod I signs and symtpoms of CVA/TIA   Status Deferred     OT SHORT TERM GOAL #2   Title Pt will be Mod I home program RUE  supervision and mod cueing   Status Partially Met  09/27/16 per dtr supervision and min cueing,     OT SHORT TERM GOAL #3   Title Pt/family will I state 2-3 pieces of a/e &/or DME for home use for increased independnece and safety   Status Achieved  09/27/16 verbalizes walker and tub bench     OT SHORT TERM GOAL #4   Title Patient will dress lower body with mod cueing and intermittent mod assistance following set up.   Time 4   Period Weeks   Status Achieved  07/17/16:  min-mod A per dtr report     OT SHORT TERM GOAL #5   Title Patient will dress upper body with set up and minimal assistance   Time 4   Period Weeks  Status Achieved  07/17/16: per dtr report     OT SHORT TERM GOAL #6   Title Pt will perform simple snack/ beverage prep or cold meal prep with supervision/ min v.c.   Baseline currently not performing consistently   Time 4   Period Weeks   Status New     OT SHORT TERM GOAL #7   Title Pt will safely navigate a moderately distracting environment and locate items with 90% or better accuracy.   Baseline 09/27/16-85%  in min distracting environment   Time 4   Period Weeks   Status New     OT SHORT TERM GOAL #8   Title Pt will demonstrate improved fine motor coordination with RUE as evidenced by decreasing 9 hole peg test score to 34 secs or less.   Baseline RUE 38.50 secs, LUE 26.19 secs   Time 4   Period Weeks   Status New     OT SHORT TERM GOAL  #9   TITLE Pt will perform  visual perceptual tasks with a cognitive component with 90% or better accuracy.   Baseline basic 1.23M number cancellation with 96% accuracy   Time 4   Period Weeks   Status New           OT Long Term Goals - 09/27/16 1201      OT LONG TERM GOAL #1   Title Pt will be Min A LB/UB Dressing and ADL's   Baseline Mod-max A   Time 8   Period Weeks   Status Achieved     OT LONG TERM GOAL #2   Title Pt will be Mod I-supervision updated HEP RUE   Time 8   Period Weeks   Status Not Met  supervision- min v.c. for HEP     OT LONG TERM GOAL #3   Title Pt will demonstrate improved RUE strength as seen by 3+/5 MMT   Baseline -3/5 RUE   Time 8   Period Weeks   Status Achieved     OT LONG TERM GOAL #4   Title Pt will demonstrate improved R hand coordination and grip strength as seen by improved Box & Blocks to 30 or greater and grip 30# or greater R.   Baseline Grip 19#; Box and Blocks = 13 (06/27/16)   Time 8   Period Weeks   Status Achieved  38 blocks, grip RUE 50 lbs     OT LONG TERM GOAL #5   Title Pt will demonstrate improved functional mobility and balance as seen by supervision assist for simulated shower transfers   Baseline Moderate (06/27/16)   Time 8   Period Weeks   Status Achieved  08/17/16  Met per daughter      OT LONG TERM GOAL #6   Title Pt will perform simple snack/ cold meal prep and basic home management at a modified independent level demonstrating good safety awareness.   Time 8   Period Weeks   Status New     OT LONG TERM GOAL #7   Title Pt / family will demonstrate understanding of updated HEP.   Time 8   Period Weeks   Status New     OT LONG TERM GOAL #8   Title Pt will demonstrate improved standing balance and RUE endurance to perfom functional reaching activities mid-high range x 10 mins in standing without rest break or LOB.   Time 8   Period Weeks   Status New  Plan - 09/27/16 1507    Clinical Impression Statement Pt  returns to therapy after being placed on hold awaiting further clarificaction of insurance visits. Pt can benefit from continued skilled occupational therapy to address: decreased RUE strength, decreased coordination, decreased balance, visual perceptual and cognitive deficits in order to maximize safety and indpendence with ADLs/IADLs.   Rehab Potential Fair   Clinical Impairments Affecting Rehab Potential severity of deficits; cognitive deficits   OT Frequency 2x / week   OT Duration 8 weeks   OT Treatment/Interventions Self-care/ADL training;Therapeutic exercise;DME and/or AE instruction;Therapeutic activities;Patient/family education;Balance training;Neuromuscular education;Energy conservation;Passive range of motion;Therapeutic exercises;Visual/perceptual remediation/compensation   Plan work towards updated goals   OT Home Exercise Plan 07/10/16:  cane and simple coordination HEP; recommendations for cutting with scissors, braiding, stringing beads   Consulted and Agree with Plan of Care Patient;Family member/caregiver   Family Member Consulted Daughter      Patient will benefit from skilled therapeutic intervention in order to improve the following deficits and impairments:  Decreased balance, Decreased endurance, Impaired vision/preception, Decreased range of motion, Decreased knowledge of precautions, Decreased cognition, Decreased activity tolerance, Decreased coordination, Decreased knowledge of use of DME, Decreased safety awareness, Decreased strength, Impaired flexibility, Impaired UE functional use, Decreased mobility, Difficulty walking, Impaired perceived functional ability, Abnormal gait, Impaired sensation  Visit Diagnosis: Cognitive social or emotional deficit following cerebral infarction  Other lack of coordination  Other abnormalities of gait and mobility  Unsteadiness on feet  Muscle weakness (generalized)    Problem List Patient Active Problem List   Diagnosis  Date Noted  . Seizure-like activity ([place]) 06/28/2016  . Cerebral infarction due to unspecified mechanism   . Gait difficulty   . Memory loss 05/22/2016  . Confusion   . Acute ischemic stroke ([place]) 05/18/2016  . Morbid obesity ([place]) 09/30/2015  . Uncontrolled type 2 diabetes mellitus with circulatory disorder, without long-term current use of insulin ([place])   . Essential hypertension   . Cerebrovascular accident (CVA) due to thrombosis of cerebral artery ([place])   . CVA (cerebral infarction) 08/07/2015  . Pure hypercholesterolemia 10/04/2009    Franke Menter 09/27/2016, 3:32 PM Theone [facility]doch, OTR/L [phone] Phone: [phone] 3:38 PM 09/27/16 [facility] [address] [place], [name], [address] Phone: [phone]   Fax:  [phone]  Name: ARLONE LENHARDT MRN: [id number] Date of Birth: [birth year]

## 2016-09-27 NOTE — Therapy (Signed)
[facility] [address] [place], [name], [address] Phone: [phone]   Fax:  [phone]  Speech Language Pathology Treatment  Patient Details  Name: Annette Munoz MRN: [id number] Date of Birth: 11/14/1965 Referring Provider: Dr. Renaye RakersVeita Bland  Encounter Date: 09/27/2016      End of Session - 09/27/16 1523    Visit Number 15   Number of Visits 30  7 more visits for new POC   Date for SLP Re-Evaluation 12/15/16   SLP Start Time 0941   SLP Stop Time  1016   SLP Time Calculation (min) 35 min   Activity Tolerance Patient tolerated treatment well      Past Medical History:  Diagnosis Date  . Diabetes mellitus without complication (HCC)   . Hypertension   . Stroke [facility](HCC)     Past Surgical History:  Procedure Laterality Date  . CESAREAN SECTION    . TEE WITHOUT CARDIOVERSION N/A 08/10/2015   Procedure: TRANSESOPHAGEAL ECHOCARDIOGRAM (TEE);  Surgeon: Chrystie NoseKenneth C Hilty, MD;  [facility];  Service: Cardiovascular;  Laterality: N/A;    There were no vitals filed for this visit.      Subjective Assessment - 09/27/16 0944    Subjective Pt arrives back after obtaining insurance auth for more visits last week.   Patient is accompained by: --  daughter, Janith LimaKadeesia   Currently in Pain? No/denies               ADULT SLP TREATMENT - 09/27/16 0947      General Information   Behavior/Cognition Cooperative;Pleasant mood;Requires cueing;Decreased sustained attention     Treatment Provided   Treatment provided Cognitive-Linquistic     Cognitive-Linquistic Treatment   Treatment focused on Cognition   Skilled Treatment Simple written directions followed with extended time usually due to reduced sustained attention, and usual min-mod verbal cues for attention to detail. Attention to detail and alternating attention when reading water park schedule/lesson options and then answering word problems re:  schedule with occasional min-mod cues for attention to details.     Assessment / Recommendations / Plan   Plan Goals updated  PRN     Progression Toward Goals   Progression toward goals Progressing toward goals            SLP Short Term Goals - 09/27/16 1529      SLP SHORT TERM GOAL #1   Title Pt will utilize external aids/memory book to be oriented to date, appointments and daily chores with occasional min A over 3 sessions   Baseline all STGs updated/renewed 09-27-16   Time 4   Period Weeks   Status On-going     SLP SHORT TERM GOAL #2   Title Pt/family will report use of external aids/memory book at home for ADL's and medication mangement over 3 sessions   Baseline one session 07-21-16, 07-28-16   Time 4   Period Weeks   Status On-going     SLP SHORT TERM GOAL #3   Title Pt will verbalize 3 cognitive impairments with mod A   Time 4   Period Weeks   Status On-going     SLP SHORT TERM GOAL #4   Title Pt will attend to simple cognitive linguistic tasks in distracting enviroment for 10 mintues with occasional min A   Time 4   Period Weeks   Status On-going          SLP Long Term Goals - 09/27/16 1530  SLP LONG TERM GOAL #1   Title Pt/family will report use of external memory aids at home with rare min cues, for successful f/u on daily schedule, safety precautions, chores, meds, etc over 3 sessions.   Baseline 08/15/16, 11/2   Time 8  All LTGs renewed 09-27-16   Period Weeks   Status On-going     SLP LONG TERM GOAL #2   Title Pt will verbalize 3 safety precautions due to cognitive impairments with mod A.    Time 8   Period Weeks   Status On-going     SLP LONG TERM GOAL #3   Title Pt will solve simple reasoning, organization, time and money problems with 75% accuracy and occasional min A   Time 8   Period Weeks   Status On-going     SLP LONG TERM GOAL #4   Title Pt will demonstrate emergent awareness by using memory compensation during therapy  session x2 sessions   Baseline 08/15/16   Time 8   Period Weeks   Status On-going          Plan - 09/27/16 1524    Clinical Impression Statement Pt contunues to demonstrate deficits in selective attention, simple functional problem solving, and has emerging intellectual awareness. Recommend continue skilled ST to maximize cognition to reduce caregiver burden and improve independence participating in family life. D/C summary was mistakenly written last week due to SLP unaware pt had obtained auth for more therapy (she exhausted original allotment of visits last month, but more visits were under appeal).  Pt is at a level commensurate from her last visit and goals to be addressed in the next four to eight weeks will be updated as needed.   Speech Therapy Frequency 2x / week   Duration --  8 weeks   Treatment/Interventions Compensatory strategies;Patient/family education;Functional tasks;Cueing hierarchy;Internal/external aids;Cognitive reorganization;SLP instruction and feedback   Potential to Achieve Goals Fair   Potential Considerations Severity of impairments;Previous level of function   Consulted and Agree with Plan of Care Patient;Family member/caregiver   Family Member Consulted daughter      Patient will benefit from skilled therapeutic intervention in order to improve the following deficits and impairments:   Cognitive communication deficit    Problem List Patient Active Problem List   Diagnosis Date Noted  . Seizure-like activity (HCC) 06/28/2016  . Cerebral infarction due to unspecified mechanism   . Gait difficulty   . Memory loss 05/22/2016  . Confusion   . Acute ischemic stroke (HCC) 05/18/2016  . Morbid obesity (HCC) 09/30/2015  . Uncontrolled type 2 diabetes mellitus with circulatory disorder, without long-term current use of insulin (HCC)   . Essential hypertension   . Cerebrovascular accident (CVA) due to thrombosis of cerebral artery (HCC)   . CVA (cerebral  infarction) 08/07/2015  . Pure hypercholesterolemia 10/04/2009    [facility]HIN[facility],Rafaella Kole ,MS, CCC-SLP  09/27/2016, 3:33 PM  [facility] [address] [place], [name], [address] Phone: [phone]   Fax:  [phone]   Name: Annette SanerBetty H Munoz MRN: [id number] Date of Birth: 07/22/1966

## 2016-09-29 ENCOUNTER — Ambulatory Visit: Payer: Commercial Managed Care - HMO | Admitting: Physical Therapy

## 2016-09-29 DIAGNOSIS — R2689 Other abnormalities of gait and mobility: Secondary | ICD-10-CM

## 2016-09-29 DIAGNOSIS — R2681 Unsteadiness on feet: Secondary | ICD-10-CM

## 2016-09-29 DIAGNOSIS — M6281 Muscle weakness (generalized): Secondary | ICD-10-CM

## 2016-09-30 NOTE — Therapy (Signed)
[place] [address] [place], [name], [address] Phone: [phone]   Fax:  [phone]  Physical Therapy Treatment  Patient Details  Name: Annette Munoz MRN: [id number] Date of Birth: January 07, 1966 Referring Provider: Rosalin Hawking  Encounter Date: 09/29/2016      PT End of Session - 09/30/16 1307    Visit Number 16   Number of Visits 32  per reeval 09/29/16   Date for PT Re-Evaluation 11/29/16  per reeval 09/29/16   Authorization Type [employer] 60 visit limit combined (per Almyra Free in front office report-18 visits combined remaining for PT/OT till year end 2017)   PT Start Time 0815  Pt arrived late   PT Stop Time 0847   PT Time Calculation (min) 32 min   Equipment Utilized During Treatment Gait belt   Activity Tolerance Patient tolerated treatment well   Behavior During Therapy Flat affect      Past Medical History:  Diagnosis Date  . Diabetes mellitus without complication ([place])   . Hypertension   . Stroke [facility])     Past Surgical History:  Procedure Laterality Date  . CESAREAN SECTION    . TEE WITHOUT CARDIOVERSION N/A 08/10/2015   Procedure: TRANSESOPHAGEAL ECHOCARDIOGRAM (TEE);  Surgeon: [name], MD;  [facility]: [facility] END[facility]COPY;  Service: Cardiovascular;  Laterality: N/A;    There were no vitals filed for this visit.      Subjective Assessment - 09/29/16 0818    Subjective Reports no falls, wearing brace.  Reports using walker at home at all times.   Patient is accompained by: Family member  daughter   Pertinent History HTN, DM, CVA (x 2), with recent TIA per family report; weakness on R side new since 06/18/16 (Has been on hold for PT since 08/17/16 due to awaiting insurance verifications/clarification on visits remaining)   Patient Stated Goals Pt's goals for therapy are to "do everything I need to do."  Updated 09/29/16:  "To get rid of brace and walker"    Currently in Pain? No/denies             [facility] PT Assessment - 09/29/16 0823      Berg Balance Test   Sit to Stand Able to stand without using hands and stabilize independently   Standing Unsupported Able to stand 2 minutes with supervision   Sitting with Back Unsupported but Feet Supported on Floor or Stool Able to sit safely and securely 2 minutes   Stand to Sit Controls descent by using hands   Transfers Able to transfer safely, definite need of hands   Standing Unsupported with Eyes Closed Able to stand 10 seconds with supervision   Standing Ubsupported with Feet Together Needs help to attain position but able to stand for 30 seconds with feet together   From Standing, Reach Forward with Outstretched Arm Can reach confidently >25 cm (10")   From Standing Position, Pick up Object from Floor Able to pick up shoe, needs supervision   From Standing Position, Turn to Look Behind Over each Shoulder Looks behind from both sides and weight shifts well   Turn 360 Degrees Able to turn 360 degrees safely but slowly   Standing Unsupported, Alternately Place Feet on Step/Stool Able to complete 4 steps without aid or supervision   Standing Unsupported, One Foot in Front Able to plae foot ahead of the other independently and hold 30 seconds   Standing on One Leg Tries to lift leg/unable to hold 3 seconds  but remains standing independently   Total Score 40   Berg comment: --  Improved from 27/56 last check in October 2017     Timed Up and Go Test   Normal TUG (seconds) 24.81  RW; then 21.76 sec with cane                     [facility] Adult PT Treatment/Exercise - 09/29/16 0076      Ambulation/Gait   Ambulation/Gait Yes   Ambulation/Gait Assistance 4: Min guard;5: Supervision   Ambulation/Gait Assistance Details Pt has several episodes of R foot catching floor and increased trunk sway when using cane   Ambulation Distance (Feet) 300 Feet   Assistive device Straight cane;Rolling walker  wears R AFO   Gait Pattern  Step-through pattern;Decreased stride length;Decreased step length - right;Decreased step length - left;Decreased dorsiflexion - right;Poor foot clearance - right  increased trunk sway   Ambulation Surface Level;Indoor   Gait velocity 23.29 sec (1.41 ft/sec) with RW, 21.70 (1.53 ft/sec) with cane     GAit velocity improved from 0.87 ft/sec at eval.  Berg improved from 6/56 at eval to 27/56 07/2016, to 40/56 this visi. TUG score improved from 35.85 sec at eval.           PT Education - 09/30/16 1306    Education provided Yes   Education Details Updated POC based on pt's return to therapy after being on hold for insurance visit clarification (per family request); updated goals, requested pt bring in copy of HEP folder next visit for review/updating   Person(s) Educated Patient;Child(ren)   Methods Explanation   Comprehension Verbalized understanding          PT Short Term Goals - 09/30/16 1302      PT SHORT TERM GOAL #1   Title Pt/family will verbalize/demo understanding of HEP for strength, balance, transfers, and gait.  UPDATED Goal:  Pt/family verblaize/demo progressive HEP for strength, balance, gait.  TARGET 10/29/16   Baseline Updated STGS per re-eval 09/29/16   Time 4   Period Weeks   Status New     PT SHORT TERM GOAL #2   Title Pt will perform sit<>stand transfers with correct sequence, with supervision, 8 of 10 trials, for improved safety and efficiency with transfers.  UPDATED Goal:  Pt will transfer 8 fo 10 reps no UE support, sit<>stand for improved transfer efficiency.  TARGET 10/29/16   Baseline per reeval 09/29/16   Time 4   Period Weeks   Status New     PT SHORT TERM GOAL #3   Title Pt will improve TUG score to less than or equal to 28 seconds for decreased fall risk.  UPDATED GOAL Improve TUG score to less than or equal to 20 seconds for decreased fall risk.  TARGET 10/29/16   Baseline 24.81 sec with RW and 21.76 sec with cane at reeval 09/29/16   Time 4    Period Weeks   Status New     PT SHORT TERM GOAL #4   Title Pt will negotiate at least 4 steps using 1 handrail, with min asistance, for improved safety with home stair negotiation.   Time 4   Period Weeks   Status Achieved     PT SHORT TERM GOAL #5   Title Pt will improve Berg score by at least 5 points (>11/56) for decreased fall risk.  UPDATED GOAL:  Pt will improve Berg score to at least 45/56 for decreased fall risk.  TARGET  10/29/16   Baseline 40/56 at reeval 09/29/16   Time 4   Period Weeks   Status New           PT Long Term Goals - 09/30/16 1253      PT LONG TERM GOAL #1   Title Pt/family will verbalize understanding of fall prevention within the home environment.  TARGET 08/25/16  UPDATED TARGET 11/29/16   Baseline Per re-eval 09/29/16   Time 8   Period Weeks   Status On-going     PT LONG TERM GOAL #2   Title Pt will improve gait velocity to at least 1.3 ft/sec for improved household gait.  UPDATED GOAL:  Pt will improved gait velocity to at least 1.8 ft/sec with cane for improved household mobility.  TARGET 2/14/187   Baseline Met and revised per reeval 09/29/16-gait velocity 1.41 ft/sec with RW; 1.53 ft/sec with cane   Time 8   Period Weeks   Status Revised     PT LONG TERM GOAL #3   Title Pt will improve TUG score to less than or equal to 20 seconds for decreased fall risk.   UPDATED GOAL:  Improve TUG score to less than or equal to 15 seconds for decreased fall risk.  TARGET 11/29/16   Baseline REvised as LTG per 09/29/16   Time 8   Period Weeks   Status Revised     PT LONG TERM GOAL #4   Title Pt will improve Berg Balance score to at least 21/56 for decreased fall risk.-MOdified to > 32/56 (07/25/16-AWM)  UPDATED Goal:  DGI to be assessed and goal to be written as appropriate.  TARGET 11/29/16   Baseline Berg 40/56 at reeval 09/29/16 (new STG added to address BERG)   Time 8   Period Weeks   Status Revised     PT LONG TERM GOAL #5   Title Pt will ambulate  at least 500 ft, indoor/outdoor surfaces with supervision, for improved household and community ambulation.  UPDATED GOAL:  ambulate at least 1000 ft indoor/outdoor surfaces with supervision.  TARGET 11/29/16   Baseline 500 ft met with RW per pt's daughter report 09/29/16 re-eval   Time 8   Period Weeks   Status Revised     PT LONG TERM GOAL #6   Title Pt will negotiate at least 4 steps, 1 rail, with supervision of family, for improved safety with stair negotiation into/out of the home.  UPDATED TARGET 11/29/16   Time 8   Period Weeks   Status On-going               Plan - 09/30/16 1309    Clinical Impression Statement Pt returns today to PT following being on hold since 08/17/16 visit (per family request) due to awaiting clarification of visits remaining with insurance.  [place] office staff has assisted in attempting to clarify, but therapy visit count and [employer] report differ.  According to [facility] (per front office staff, Almyra Free), pt has 18 visits remaining in calendar year combined for PT, OT.  Daughter reports patient has been doing a lot of walking with supervision at home and in community.  No falls reported.  Pt presents today with continued decreased RLE strength and awareness, decreased balance, decreased gait speed and independence, though pt's functional mobility has improved since initial therapy evaluation in 06/2016.  STGs have been modified to address balance, HEP, transfers and gait.  LTG 1 and 6 are ongoing.  LTG 2,4, 5 have been met and  are revised.  LTG 3 revised.  Pt has improved on gait velocity, BERG and TUG scores, but she does remain at fall risk.  She would conitnue to benefit from further skilled PT to address the above stated deficits to improve functional mobility and decrease fall risk.   Rehab Potential Fair   Clinical Impairments Affecting Rehab Potential Cognition (pt does have family support of husband and daughter)   PT Frequency 2x / week   PT Duration 8 weeks  Per  recert/reeval 94/99/71   PT Treatment/Interventions ADLs/Self Care Home Management;Functional mobility training;Stair training;Gait training;DME Instruction;Therapeutic activities;Therapeutic exercise;Balance training;Neuromuscular re-education;Patient/family education;Orthotic Fit/Training   PT Next Visit Plan Pt/family to bring in HEP copy next visit for full review/updating; continue to work on standing balance and gait training with cane; perform DGI in order to complete/write LTG   Consulted and Agree with Plan of Care Patient;Family member/caregiver   Family Member Consulted daughter      Patient will benefit from skilled therapeutic intervention in order to improve the following deficits and impairments:  Abnormal gait, Decreased balance, Decreased cognition, Decreased mobility, Decreased knowledge of use of DME, Decreased safety awareness, Decreased strength, Difficulty walking, Postural dysfunction, Impaired tone, Increased fascial restricitons  Visit Diagnosis: Other abnormalities of gait and mobility  Unsteadiness on feet  Muscle weakness (generalized)     Problem List Patient Active Problem List   Diagnosis Date Noted  . Seizure-like activity ([place]) 06/28/2016  . Cerebral infarction due to unspecified mechanism   . Gait difficulty   . Memory loss 05/22/2016  . Confusion   . Acute ischemic stroke ([place]) 05/18/2016  . Morbid obesity ([place]) 09/30/2015  . Uncontrolled type 2 diabetes mellitus with circulatory disorder, without long-term current use of insulin ([place])   . Essential hypertension   . Cerebrovascular accident (CVA) due to thrombosis of cerebral artery ([place])   . CVA (cerebral infarction) 08/07/2015  . Pure hypercholesterolemia 10/04/2009    MARRIOTT,AMY W. 09/30/2016, 1:17 PM  Frazier Butt., PT  [place] [address] [place], [name], [address] Phone: [phone]   Fax:  [phone]  Name:  BRIZEIDA MCMURRY MRN: [id number] Date of Birth: [birth year]

## 2016-10-03 ENCOUNTER — Ambulatory Visit: Payer: Commercial Managed Care - HMO | Admitting: Physical Therapy

## 2016-10-03 ENCOUNTER — Ambulatory Visit: Payer: Commercial Managed Care - HMO | Admitting: Occupational Therapy

## 2016-10-03 ENCOUNTER — Encounter: Payer: Self-pay | Admitting: Occupational Therapy

## 2016-10-03 ENCOUNTER — Ambulatory Visit: Payer: Commercial Managed Care - HMO

## 2016-10-03 DIAGNOSIS — M6281 Muscle weakness (generalized): Secondary | ICD-10-CM

## 2016-10-03 DIAGNOSIS — R278 Other lack of coordination: Secondary | ICD-10-CM

## 2016-10-03 DIAGNOSIS — R2681 Unsteadiness on feet: Secondary | ICD-10-CM

## 2016-10-03 DIAGNOSIS — R41841 Cognitive communication deficit: Secondary | ICD-10-CM | POA: Diagnosis not present

## 2016-10-03 DIAGNOSIS — R2689 Other abnormalities of gait and mobility: Secondary | ICD-10-CM

## 2016-10-03 DIAGNOSIS — I69315 Cognitive social or emotional deficit following cerebral infarction: Secondary | ICD-10-CM

## 2016-10-03 DIAGNOSIS — R41842 Visuospatial deficit: Secondary | ICD-10-CM

## 2016-10-03 NOTE — Patient Instructions (Signed)
  Please complete the assigned speech therapy homework and return it to your next session.  

## 2016-10-03 NOTE — Therapy (Signed)
[facility] [address] [place], [name], [address] Phone: [phone]   Fax:  [phone]  Occupational Therapy Treatment  Patient Details  Name: Annette Munoz MRN: [id number] Date of Birth: 11-07-1965 Referring Provider: Veneda Melter. Candiss Norse  Encounter Date: 10/03/2016      OT End of Session - 10/03/16 1108    Visit Number 15   Number of Visits 30   Date for OT Re-Evaluation 11/26/16   Authorization Type [employer] 60 Visit Limit Combined (OT eval +14 speech treatments earlier this year) as of 10/15/16 insurance reported 4 visits used this year (evals not included)   Authorization Time Period Pt 35 daughter says insurance told her that pt had 18 more visits for the year, therapist conveyed that this is not consistnet with the information to therapist, pt and dtr would like to continue therapy anyway.   Authorization - Visit Number 15   OT Start Time [id number]   OT Stop Time 1020   OT Time Calculation (min) 41 min   Activity Tolerance Patient tolerated treatment well   Behavior During Therapy WFL for tasks assessed/performed      Past Medical History:  Diagnosis Date  . Diabetes mellitus without complication ([place])   . Hypertension   . Stroke [facility])     Past Surgical History:  Procedure Laterality Date  . CESAREAN SECTION    . TEE WITHOUT CARDIOVERSION N/A 08/10/2015   Procedure: TRANSESOPHAGEAL ECHOCARDIOGRAM (TEE);  Surgeon: [name], MD;  Location: [facility] ENDOSCOPY;  Service: Cardiovascular;  Laterality: N/A;    There were no vitals filed for this visit.      Subjective Assessment - 10/03/16 1048    Subjective  Patients daughter indicated that patient helped in the kitchen for the first time recently.   Patient is accompained by: Family member   Patient Stated Goals to get better, memory, focusing   Currently in Pain? No/denies   Pain Score 0-No pain                       OT Treatments/Exercises (OP) - 10/03/16 0001      ADLs   Cooking Used therapeutic cooking to address patient's cognitive as well as physical impairments.  Patient able to make decision to make brownies - from choice of three.  Overtly cueing patient engagement in front of daughter. Patient able to identify needed ingredients, and set up recipe directly in front of herself - and referred back repeatedly without prompting.  Patient moved around kitchen area - without walker, and without loss of balance.  Daughter indicates that patient is not using walker in small spaces at home.  Patient demonstrated adequate balance without her walker in the kitchen, and in fact was safer within this functional activity without it.  Patient able to sequence steps involved in this task with intermittent questioning cues,a nd written instructions.  Patient enjoyed cooking prior to her stroke, and so this was a familiar task.  Patient able to recall surface level detail immediately following and 5 min after activity.  Patient unable to recall detailed information even with mod cueing.  Patient with very early awareness of deficits - able to state that one sided weakness was from her stroke (intellectual)  Patient became tearful in discussiona bout concentration and memory deficits.                   OT Education - 10/03/16 1105  Education provided Yes   Education Details Discussed with daughter in patient's presence the benefit of therapeutic activity - eg cooking, laundry, etc  to challenge patient's ability to actively attend to a familiar task   Person(s) Educated Patient;Child(ren)   Methods Explanation;Tactile cues;Verbal cues   Comprehension Verbalized understanding;Need further instruction          OT Short Term Goals - 09/27/16 1158      OT SHORT TERM GOAL #1   Title Pt will be Mod I signs and symtpoms of CVA/TIA   Status Deferred     OT SHORT TERM GOAL #2    Title Pt will be Mod I home program RUE  supervision and mod cueing   Status Partially Met  09/27/16 per dtr supervision and min cueing,     OT SHORT TERM GOAL #3   Title Pt/family will I state 2-3 pieces of a/e &/or DME for home use for increased independnece and safety   Status Achieved  09/27/16 verbalizes walker and tub bench     OT SHORT TERM GOAL #4   Title Patient will dress lower body with mod cueing and intermittent mod assistance following set up.   Time 4   Period Weeks   Status Achieved  07/17/16:  min-mod A per dtr report     OT SHORT TERM GOAL #5   Title Patient will dress upper body with set up and minimal assistance   Time 4   Period Weeks   Status Achieved  07/17/16: per dtr report     OT SHORT TERM GOAL #6   Title Pt will perform simple snack/ beverage prep or cold meal prep with supervision/ min v.c.   Baseline currently not performing consistently   Time 4   Period Weeks   Status New     OT SHORT TERM GOAL #7   Title Pt will safely navigate a moderately distracting environment and locate items with 90% or better accuracy.   Baseline 09/27/16-85%  in min distracting environment   Time 4   Period Weeks   Status New     OT SHORT TERM GOAL #8   Title Pt will demonstrate improved fine motor coordination with RUE as evidenced by decreasing 9 hole peg test score to 34 secs or less.   Baseline RUE 38.50 secs, LUE 26.19 secs   Time 4   Period Weeks   Status New     OT SHORT TERM GOAL  #9   TITLE Pt will perform visual perceptual tasks with a cognitive component with 90% or better accuracy.   Baseline basic 1.98M number cancellation with 96% accuracy   Time 4   Period Weeks   Status New           OT Long Term Goals - 09/27/16 1201      OT LONG TERM GOAL #1   Title Pt will be Min A LB/UB Dressing and ADL's   Baseline Mod-max A   Time 8   Period Weeks   Status Achieved     OT LONG TERM GOAL #2   Title Pt will be Mod I-supervision updated HEP RUE    Time 8   Period Weeks   Status Not Met  supervision- min v.c. for HEP     OT LONG TERM GOAL #3   Title Pt will demonstrate improved RUE strength as seen by 3+/5 MMT   Baseline -3/5 RUE   Time 8   Period Weeks   Status Achieved  OT LONG TERM GOAL #4   Title Pt will demonstrate improved R hand coordination and grip strength as seen by improved Box & Blocks to 30 or greater and grip 30# or greater R.   Baseline Grip 19#; Box and Blocks = 13 (06/27/16)   Time 8   Period Weeks   Status Achieved  38 blocks, grip RUE 50 lbs     OT LONG TERM GOAL #5   Title Pt will demonstrate improved functional mobility and balance as seen by supervision assist for simulated shower transfers   Baseline Moderate (06/27/16)   Time 8   Period Weeks   Status Achieved  08/17/16  Met per daughter      OT LONG TERM GOAL #6   Title Pt will perform simple snack/ cold meal prep and basic home management at a modified independent level demonstrating good safety awareness.   Time 8   Period Weeks   Status New     OT LONG TERM GOAL #7   Title Pt / family will demonstrate understanding of updated HEP.   Time 8   Period Weeks   Status New     OT LONG TERM GOAL #8   Title Pt will demonstrate improved standing balance and RUE endurance to perfom functional reaching activities mid-high range x 10 mins in standing without rest break or LOB.   Time 8   Period Weeks   Status New               Plan - 10/03/16 1109    Clinical Impression Statement Patient is showing very basic intellectual awareness, and appears to more easily engage in familiar functional activities.     Rehab Potential Fair   Clinical Impairments Affecting Rehab Potential severity of deficits; cognitive deficits   OT Frequency 2x / week   OT Duration 8 weeks   OT Treatment/Interventions Self-care/ADL training;Therapeutic exercise;DME and/or AE instruction;Therapeutic activities;Patient/family education;Balance  training;Neuromuscular education;Energy conservation;Passive range of motion;Therapeutic exercises;Visual/perceptual remediation/compensation   Plan familiar functional task to challenge cognition - attention, beginning sequencing - cooking, laundry, etc   OT Home Exercise Plan 07/10/16:  cane and simple coordination HEP; recommendations for cutting with scissors, braiding, stringing beads   Consulted and Agree with Plan of Care Patient;Family member/caregiver   Family Member Consulted Daughter      Patient will benefit from skilled therapeutic intervention in order to improve the following deficits and impairments:  Decreased balance, Decreased endurance, Impaired vision/preception, Decreased range of motion, Decreased knowledge of precautions, Decreased cognition, Decreased activity tolerance, Decreased coordination, Decreased knowledge of use of DME, Decreased safety awareness, Decreased strength, Impaired flexibility, Impaired UE functional use, Decreased mobility, Difficulty walking, Impaired perceived functional ability, Abnormal gait, Impaired sensation  Visit Diagnosis: Unsteadiness on feet  Muscle weakness (generalized)  Cognitive social or emotional deficit following cerebral infarction  Other lack of coordination  Visuospatial deficit    Problem List Patient Active Problem List   Diagnosis Date Noted  . Seizure-like activity ([place]) 06/28/2016  . Cerebral infarction due to unspecified mechanism   . Gait difficulty   . Memory loss 05/22/2016  . Confusion   . Acute ischemic stroke ([place]) 05/18/2016  . Morbid obesity ([place]) 09/30/2015  . Uncontrolled type 2 diabetes mellitus with circulatory disorder, without long-term current use of insulin ([place])   . Essential hypertension   . Cerebrovascular accident (CVA) due to thrombosis of cerebral artery ([place])   . CVA (cerebral infarction) 08/07/2015  . Pure hypercholesterolemia 10/04/2009  Mariah Milling 10/03/2016, 11:11  AM  [facility] [address] [place], [name], [address] Phone: [phone]   Fax:  [phone]  Name: GERARDO TERRITO MRN: [id number] Date of Birth: 04-05-1966

## 2016-10-03 NOTE — Therapy (Signed)
[facility] [address] [place], [name], [address] Phone: [phone]   Fax:  [phone]  Physical Therapy Treatment  Patient Details  Name: Annette Munoz MRN: [id number] Date of Birth: 1965-12-10 Referring Provider: Rosalin Hawking  Encounter Date: 10/03/2016      PT End of Session - 10/03/16 1202    Visit Number 17   Number of Visits 32  per reeval 09/29/16   Date for PT Re-Evaluation 11/29/16  per reeval 09/29/16   Authorization Type [employer] 60 visit limit combined (per Almyra Free in front office report-18 visits combined remaining for PT/OT till year end 2017)   Authorization - Visit Number 2   Authorization - Number of Visits 9  until year end 2017   PT Start Time 1106   PT Stop Time 1146   PT Time Calculation (min) 40 min   Equipment Utilized During Treatment Gait belt   Activity Tolerance Patient tolerated treatment well   Behavior During Therapy [facility] for tasks assessed/performed      Past Medical History:  Diagnosis Date  . Diabetes mellitus without complication ([place])   . Hypertension   . Stroke [facility])     Past Surgical History:  Procedure Laterality Date  . CESAREAN SECTION    . TEE WITHOUT CARDIOVERSION N/A 08/10/2015   Procedure: TRANSESOPHAGEAL ECHOCARDIOGRAM (TEE);  Surgeon: [name], MD;  Location: [facility] ENDOSCOPY;  Service: Cardiovascular;  Laterality: N/A;    There were no vitals filed for this visit.      Subjective Assessment - 10/03/16 1107    Subjective No changes, no falls; daughter does note darkened area of skin on great toe of R foot where she wears brace.   Patient is accompained by: Family member  daughter   Pertinent History HTN, DM, CVA (x 2), with recent TIA per family report; weakness on R side new since 06/18/16 (Has been on hold for PT since 08/17/16 due to awaiting insurance verifications/clarification on visits remaining)   Patient Stated Goals Pt's goals for therapy are to  "do everything I need to do."  Updated 09/29/16:  "To get rid of brace and walker"    Currently in Pain? No/denies        Briefly assessed R great toe-appears to be very small blood blister where skin attaches to toenail.  Discussed continuing to monitor and when to contact physician.  Discussed following up with orthotist to address proper shoe fit for brace.  Prior to gait, made sure to instruct patient in proper positioning/donning of brace for optimal positioning in brace.                  [facility] Adult PT Treatment/Exercise - 10/03/16 0001      Transfers   Transfers Sit to Stand;Stand to Sit   Sit to Stand 5: Supervision;With upper extremity assist;From elevated surface   Sit to Stand Details Verbal cues for sequencing;Verbal cues for technique  Cues for equal weightbearing bilateral lower extremities   Stand to Sit 5: Supervision;With upper extremity assist;To elevated surface   Number of Reps 10 reps  from mat surface   Transfer Cueing 2 sets of 10 reps of seated ball squeezes x 10 reps prior to sit<>stand with ball squeezes   Comments Repeated sit<>stand for functional lower extremity strengthening, with hip adductor, ball squeezes x additional 10 reps     Ambulation/Gait   Ambulation/Gait Yes   Ambulation/Gait Assistance 4: Min guard;5: Supervision   Ambulation/Gait Assistance Details  Initial cues provided for step through pattern, for cane use in LUE, with frequent verbal cues for increased RLE stance time and increased L step length  Tactile cues for weightshifting for improved RLE stance   Ambulation Distance (Feet) 160 Feet  x 2, then 230 ft   Assistive device Straight cane  cane with small quad tip   Gait Pattern Step-through pattern;Decreased stride length;Decreased step length - right;Decreased step length - left;Decreased dorsiflexion - right;Poor foot clearance - right;Step-to pattern;Decreased stance time - right  Cues for increased R foot clearance    Ambulation Surface Level;Indoor   Pre-Gait Activities At counter:  forward step and weigthshift RLE with cane in LUE (with RUE supported at counter), then LLE step and weigthshift (RUE supported at counter), with min assist for balance   Gait Comments Provided tactile cues for weightshifting through hips for improved weigthshifting for increased RLE stance time, increased L step length.    Gait with RW 80 ft x 2, then 60 ft x 2 with Supervision     Knee/Hip Exercises: Aerobic   Stepper Seated Stepper, Level 1.3, 4 extremities x 6 minutes, cues for positioning of RLE to decrease RLE external rotation                  PT Short Term Goals - 10/03/16 1209      PT SHORT TERM GOAL #1   Title  UPDATED Goal:  Pt/family verblaize/demo progressive HEP for strength, balance, gait.  TARGET 10/29/16   Baseline Updated STGS per re-eval 09/29/16   Time 4   Period Weeks   Status New     PT SHORT TERM GOAL #2   Title  UPDATED Goal:  Pt will transfer 8 fo 10 reps no UE support, sit<>stand for improved transfer efficiency.  TARGET 10/29/16   Baseline per reeval 09/29/16   Time 4   Period Weeks   Status New     PT SHORT TERM GOAL #3   Title  UPDATED GOAL Improve TUG score to less than or equal to 20 seconds for decreased fall risk.  TARGET 10/29/16   Baseline 24.81 sec with RW and 21.76 sec with cane at reeval 09/29/16   Time 4   Period Weeks   Status New     PT SHORT TERM GOAL #4   Title Pt will negotiate at least 4 steps using 1 handrail, with min asistance, for improved safety with home stair negotiation.   Time 4   Period Weeks   Status Achieved     PT SHORT TERM GOAL #5   Title  UPDATED GOAL:  Pt will improve Berg score to at least 45/56 for decreased fall risk.  TARGET 10/29/16   Baseline 40/56 at reeval 09/29/16   Time 4   Period Weeks   Status New           PT Long Term Goals - 10/03/16 1210      PT LONG TERM GOAL #1   Title Pt/family will verbalize understanding of  fall prevention within the home environment.  TARGET 08/25/16  UPDATED TARGET 11/29/16   Baseline Per re-eval 09/29/16   Time 8   Period Weeks   Status On-going     PT LONG TERM GOAL #2   Title  UPDATED GOAL:  Pt will improved gait velocity to at least 1.8 ft/sec with cane for improved household mobility.  TARGET 2/14/187   Baseline Met and revised per reeval 09/29/16-gait velocity 1.41 ft/sec with RW;  1.53 ft/sec with cane   Time 8   Period Weeks   Status Revised     PT LONG TERM GOAL #3   Title UPDATED GOAL:  Improve TUG score to less than or equal to 15 seconds for decreased fall risk.  TARGET 11/29/16   Baseline REvised as LTG per 09/29/16   Time 8   Period Weeks   Status Revised     PT LONG TERM GOAL #4   Title   UPDATED Goal:  DGI to be assessed and goal to be written as appropriate.  TARGET 11/29/16   Baseline Berg 40/56 at reeval 09/29/16 (new STG added to address BERG)   Time 8   Period Weeks   Status Revised     PT LONG TERM GOAL #5   Title UPDATED GOAL:  ambulate at least 1000 ft indoor/outdoor surfaces with supervision.  TARGET 11/29/16   Baseline 500 ft met with RW per pt's daughter report 09/29/16 re-eval   Time 8   Period Weeks   Status Revised     PT LONG TERM GOAL #6   Title Pt will negotiate at least 4 steps, 1 rail, with supervision of family, for improved safety with stair negotiation into/out of the home.  UPDATED TARGET 11/29/16   Time 8   Period Weeks   Status On-going               Plan - 10/03/16 1203    Clinical Impression Statement Pt worked in today due to opening in PT's schedule (since pt wasn't scheduled for PT, daughter did not have PT HEP pictures).  Focused PT session on functional lower extremity strengthening and gait with cane.  Attempted use of small tip quad cane, but pt able to have improved gait sequence and gait pattern with single point cane.  Pt continues to need therapist assist for safety with cane.   Rehab Potential Fair    Clinical Impairments Affecting Rehab Potential Cognition (pt does have family support of husband and daughter)   PT Frequency 2x / week   PT Duration 8 weeks  Per recert/reeval 90/30/09   PT Treatment/Interventions ADLs/Self Care Home Management;Functional mobility training;Stair training;Gait training;DME Instruction;Therapeutic activities;Therapeutic exercise;Balance training;Neuromuscular re-education;Patient/family education;Orthotic Fit/Training   PT Next Visit Plan Pt/family to bring in HEP copy next visit for full review/updating; continue to work on standing balance and gait training with cane; perform DGI in order to complete/write LTG   Consulted and Agree with Plan of Care Patient;Family member/caregiver   Family Member Consulted daughter      Patient will benefit from skilled therapeutic intervention in order to improve the following deficits and impairments:  Abnormal gait, Decreased balance, Decreased cognition, Decreased mobility, Decreased knowledge of use of DME, Decreased safety awareness, Decreased strength, Difficulty walking, Postural dysfunction, Impaired tone, Increased fascial restricitons  Visit Diagnosis: Other abnormalities of gait and mobility  Muscle weakness (generalized)     Problem List Patient Active Problem List   Diagnosis Date Noted  . Seizure-like activity ([place]) 06/28/2016  . Cerebral infarction due to unspecified mechanism   . Gait difficulty   . Memory loss 05/22/2016  . Confusion   . Acute ischemic stroke ([place]) 05/18/2016  . Morbid obesity ([place]) 09/30/2015  . Uncontrolled type 2 diabetes mellitus with circulatory disorder, without long-term current use of insulin ([place])   . Essential hypertension   . Cerebrovascular accident (CVA) due to thrombosis of cerebral artery ([place])   . CVA (cerebral infarction) 08/07/2015  . Pure  hypercholesterolemia 10/04/2009    MARRIOTT,AMY W. 10/03/2016, 12:11 PM  Frazier Butt., PT  [facility] [address] [place], [name], [address] Phone: [phone]   Fax:  [phone]  Name: Annette Munoz MRN: [id number] Date of Birth: [birth year]

## 2016-10-03 NOTE — Therapy (Signed)
[facility] [address] [place], [name], [address] Phone: [phone]   Fax:  [phone]  Speech Language Pathology Treatment  Patient Details  Name: Annette Munoz MRN: [id number] Date of Birth: 04/26/1966 Referring Provider: Dr. Renaye RakersVeita Bland  Encounter Date: 10/03/2016      End of Session - 10/03/16 1723    Visit Number 16   Number of Visits 30   Date for SLP Re-Evaluation 12/15/16   SLP Start Time 1453  pt was late   SLP Stop Time  1531   SLP Time Calculation (min) 38 min   Activity Tolerance Patient tolerated treatment well      Past Medical History:  Diagnosis Date  . Diabetes mellitus without complication (HCC)   . Hypertension   . Stroke [facility](HCC)     Past Surgical History:  Procedure Laterality Date  . CESAREAN SECTION    . TEE WITHOUT CARDIOVERSION N/A 08/10/2015   Procedure: TRANSESOPHAGEAL ECHOCARDIOGRAM (TEE);  Surgeon: Chrystie NoseKenneth C Hilty, MD;  [facility];  Service: Cardiovascular;  Laterality: N/A;    There were no vitals filed for this visit.      Subjective Assessment - 10/03/16 1531    Subjective Pt arrives with daughter, Janith LimaKadeesia.   Patient is accompained by: Family member  daughter               ADULT SLP TREATMENT - 10/03/16 1703      General Information   Behavior/Cognition Cooperative;Pleasant mood;Distractible;Decreased sustained attention     Treatment Provided   Treatment provided Cognitive-Linquistic     Pain Assessment   Pain Assessment No/denies pain     Cognitive-Linquistic Treatment   Treatment focused on Cognition   Skilled Treatment SLP faciliatated pt's attention (sustained and selective) today with constant review of OT and PT names from earlier in the day. Pt req'd total A initially and then max A consistently following that, without recall of names of therapists after 3 minutes. In divergent naming task, pt req'd mod A from SLP usually to  generate an item appropriate for the category presented by SLP, often times pt would generate an item inappropriate for the parameters.  She req'd cue to look in her notebook for her schedule when asked by SLP when she returns for therapy.     Assessment / Recommendations / Plan   Plan Continue with current plan of care     Progression Toward Goals   Progression toward goals Progressing toward goals            SLP Short Term Goals - 10/03/16 1725      SLP SHORT TERM GOAL #1   Title Pt will utilize external aids/memory book to be oriented to date, appointments and daily chores with occasional min A over 3 sessions   Baseline all STGs updated/renewed 09-27-16   Time 3   Period Weeks   Status On-going     SLP SHORT TERM GOAL #2   Title Pt/family will report use of external aids/memory book at home for ADL's and medication mangement over 3 sessions   Baseline one session 07-21-16, 07-28-16   Time 3   Period Weeks   Status On-going     SLP SHORT TERM GOAL #3   Title Pt will verbalize 3 cognitive impairments with mod A   Time 3   Period Weeks   Status On-going     SLP SHORT TERM GOAL #4   Title Pt will attend to simple cognitive  linguistic tasks in distracting enviroment for 10 mintues with occasional min A   Time 3   Period Weeks   Status On-going          SLP Long Term Goals - 09/27/16 1530      SLP LONG TERM GOAL #1   Title Pt/family will report use of external memory aids at home with rare min cues, for successful f/u on daily schedule, safety precautions, chores, meds, etc over 3 sessions.   Baseline 08/15/16, 11/2   Time 8  All LTGs renewed 09-27-16   Period Weeks   Status On-going     SLP LONG TERM GOAL #2   Title Pt will verbalize 3 safety precautions due to cognitive impairments with mod A.    Time 8   Period Weeks   Status On-going     SLP LONG TERM GOAL #3   Title Pt will solve simple reasoning, organization, time and money problems with 75% accuracy  and occasional min A   Time 8   Period Weeks   Status On-going     SLP LONG TERM GOAL #4   Title Pt will demonstrate emergent awareness by using memory compensation during therapy session x2 sessions   Baseline 08/15/16   Time 8   Period Weeks   Status On-going          Plan - 10/03/16 1725    Clinical Impression Statement Pt contunues to demonstrate imrprovement in selective attention, simple functional problem solving, emerging intellectual awareness. Recommend continue skilled ST to maximize cognition to reduce caregiver burden and improve independence participating in family life.   Speech Therapy Frequency 2x / week   Treatment/Interventions Compensatory strategies;Patient/family education;Functional tasks;Cueing hierarchy;Internal/external aids;Cognitive reorganization;SLP instruction and feedback   Potential to Achieve Goals Fair   Potential Considerations Severity of impairments;Previous level of function   Consulted and Agree with Plan of Care Patient;Family member/caregiver   Family Member Consulted daughter      Patient will benefit from skilled therapeutic intervention in order to improve the following deficits and impairments:   Cognitive communication deficit    Problem List Patient Active Problem List   Diagnosis Date Noted  . Seizure-like activity (HCC) 06/28/2016  . Cerebral infarction due to unspecified mechanism   . Gait difficulty   . Memory loss 05/22/2016  . Confusion   . Acute ischemic stroke (HCC) 05/18/2016  . Morbid obesity (HCC) 09/30/2015  . Uncontrolled type 2 diabetes mellitus with circulatory disorder, without long-term current use of insulin (HCC)   . Essential hypertension   . Cerebrovascular accident (CVA) due to thrombosis of cerebral artery (HCC)   . CVA (cerebral infarction) 08/07/2015  . Pure hypercholesterolemia 10/04/2009    [facility],[name] ,MS, CCC-SLP  10/03/2016, 5:27 PM  [facility] [facility] [address] [place], [name], [address] Phone: [phone]   Fax:  [phone]   Name: Annette Munoz MRN: [id number] Date of Birth: 03/14/1966

## 2016-10-04 ENCOUNTER — Ambulatory Visit: Payer: Commercial Managed Care - HMO | Admitting: *Deleted

## 2016-10-04 ENCOUNTER — Ambulatory Visit: Payer: Commercial Managed Care - HMO

## 2016-10-04 ENCOUNTER — Ambulatory Visit: Payer: Commercial Managed Care - HMO | Admitting: Physical Therapy

## 2016-10-04 DIAGNOSIS — R41841 Cognitive communication deficit: Secondary | ICD-10-CM

## 2016-10-04 NOTE — Therapy (Signed)
[facility] [address] [place], [name], [address] Phone: [phone]   Fax:  [phone]  Speech Language Pathology Treatment  Patient Details  Name: Annette SanerBetty H Mckoy MRN: [id number] Date of Birth: 10/16/1966 Referring Provider: Dr. Renaye RakersVeita Bland  Encounter Date: 10/04/2016      End of Session - 10/04/16 1106    Visit Number 17   Number of Visits 30   Date for SLP Re-Evaluation 12/15/16   SLP Start Time 0940  pt late arriving   SLP Stop Time  1017   SLP Time Calculation (min) 37 min   Activity Tolerance Patient tolerated treatment well      Past Medical History:  Diagnosis Date  . Diabetes mellitus without complication (HCC)   . Hypertension   . Stroke [facility](HCC)     Past Surgical History:  Procedure Laterality Date  . CESAREAN SECTION    . TEE WITHOUT CARDIOVERSION N/A 08/10/2015   Procedure: TRANSESOPHAGEAL ECHOCARDIOGRAM (TEE);  Surgeon: Chrystie NoseKenneth C Hilty, MD;  [facility];  Service: Cardiovascular;  Laterality: N/A;    There were no vitals filed for this visit.      Subjective Assessment - 10/04/16 0946    Subjective Pt arrives with daughter, Janith LimaKadeesia.   Patient is accompained by: Family member               ADULT SLP TREATMENT - 10/04/16 0946      General Information   Behavior/Cognition Cooperative;Pleasant mood;Distractible;Requires cueing;Decreased sustained attention     Treatment Provided   Treatment provided Cognitive-Linquistic     Cognitive-Linquistic Treatment   Treatment focused on Cognition   Skilled Treatment Throghout session SLP noted to daughter how to structure pt's homework and cognitive tasks at home to maximize progress for pt's attention deficits. SLP faciliatated pt's attention (sustained and selective) today with In reviewing pt's homework SLP targeted sustained and selective attention, pt req'd extra time and occasional min A for finding correct response to  review with SLP. She req'd min-mod A usually for generating another response appropriate for the category, and pt again sometimes generated an item inappropriate for the parameter necessary.  She again req'd a cue to look in her notebook for upcoming therapy schedule when asked by SLP if she has more therapy today.     Assessment / Recommendations / Plan   Plan Continue with current plan of care     Progression Toward Goals   Progression toward goals --  pt with decr'd attention making progress intermittent          SLP Education - 10/04/16 1104    Education provided Yes   Education Details educated pt/daughter how to manipulate pt's environment and task to maximize progress with pt's attention skills with home tasks/homework   Person(s) Educated Patient;Child(ren)   Methods Explanation   Comprehension Verbalized understanding          SLP Short Term Goals - 10/04/16 1108      SLP SHORT TERM GOAL #1   Title Pt will utilize external aids/memory book to be oriented to date, appointments and daily chores with occasional min A over 3 sessions   Baseline all STGs updated/renewed 09-27-16   Time 3   Period Weeks   Status On-going     SLP SHORT TERM GOAL #2   Title Pt/family will report use of external aids/memory book at home for ADL's and medication mangement over 3 sessions   Baseline one session 07-21-16, 07-28-16   Time  3   Period Weeks   Status On-going     SLP SHORT TERM GOAL #3   Title Pt will verbalize 3 cognitive impairments with mod A   Time 3   Period Weeks   Status On-going     SLP SHORT TERM GOAL #4   Title Pt will attend to simple cognitive linguistic tasks in distracting enviroment for 10 mintues with occasional min A   Time 3   Period Weeks   Status On-going          SLP Long Term Goals - 10/04/16 1108      SLP LONG TERM GOAL #1   Title Pt/family will report use of external memory aids at home with rare min cues, for successful f/u on daily  schedule, safety precautions, chores, meds, etc over 3 sessions.   Baseline 08/15/16, 11/2   Time 8  All LTGs renewed 09-27-16   Period Weeks   Status On-going     SLP LONG TERM GOAL #2   Title Pt will verbalize 3 safety precautions due to cognitive impairments with mod A.    Time 8   Period Weeks   Status On-going     SLP LONG TERM GOAL #3   Title Pt will solve simple reasoning, organization, time and money problems with 75% accuracy and occasional min A   Time 8   Period Weeks   Status On-going     SLP LONG TERM GOAL #4   Title Pt will demonstrate emergent awareness by using memory compensation during therapy session x2 sessions   Baseline 08/15/16   Time 8   Period Weeks   Status On-going          Plan - 10/04/16 1107    Clinical Impression Statement Pt demonstrate intermittent imrprovement in selective attention and sustained attention. Recommend continue skilled ST to maximize cognition to reduce caregiver burden and improve independence participating in family life.   Speech Therapy Frequency 2x / week   Duration --  8 weeks   Treatment/Interventions Compensatory strategies;Patient/family education;Functional tasks;Cueing hierarchy;Internal/external aids;Cognitive reorganization;SLP instruction and feedback   Potential to Achieve Goals Fair   Potential Considerations Severity of impairments;Previous level of function   Consulted and Agree with Plan of Care Patient;Family member/caregiver   Family Member Consulted daughter      Patient will benefit from skilled therapeutic intervention in order to improve the following deficits and impairments:   Cognitive communication deficit    Problem List Patient Active Problem List   Diagnosis Date Noted  . Seizure-like activity (HCC) 06/28/2016  . Cerebral infarction due to unspecified mechanism   . Gait difficulty   . Memory loss 05/22/2016  . Confusion   . Acute ischemic stroke (HCC) 05/18/2016  . Morbid obesity  (HCC) 09/30/2015  . Uncontrolled type 2 diabetes mellitus with circulatory disorder, without long-term current use of insulin (HCC)   . Essential hypertension   . Cerebrovascular accident (CVA) due to thrombosis of cerebral artery (HCC)   . CVA (cerebral infarction) 08/07/2015  . Pure hypercholesterolemia 10/04/2009    [facility],Nivin Braniff ,MS, CCC-SLP  10/04/2016, 11:09 AM  [place] [facility] [address] [place], [name], [address] Phone: [phone]   Fax:  [phone]   Name: Annette SanerBetty H Farina MRN: [id number] Date of Birth: 11/03/1965

## 2016-10-04 NOTE — Patient Instructions (Signed)
  Please complete the assigned speech therapy homework and return it to your next session.  

## 2016-10-05 ENCOUNTER — Ambulatory Visit: Payer: Commercial Managed Care - HMO | Admitting: Occupational Therapy

## 2016-10-05 DIAGNOSIS — I69315 Cognitive social or emotional deficit following cerebral infarction: Secondary | ICD-10-CM

## 2016-10-05 DIAGNOSIS — R278 Other lack of coordination: Secondary | ICD-10-CM

## 2016-10-05 DIAGNOSIS — R41841 Cognitive communication deficit: Secondary | ICD-10-CM | POA: Diagnosis not present

## 2016-10-05 NOTE — Therapy (Signed)
[facility] [address] [place], [name], [address] Phone: [phone]   Fax:  [phone]  Occupational Therapy Treatment  Patient Details  Name: Annette Munoz MRN: [id number] Date of Birth: Oct 28, 1965 Referring Provider: Veneda Melter. Candiss Norse  Encounter Date: 10/05/2016      OT End of Session - 10/05/16 1205    Visit Number 16   Number of Visits 30   Date for OT Re-Evaluation 11/26/16   Authorization Type [employer] 60 Visit Limit Combined (OT eval +14 speech treatments earlier this year) as of 10/15/16 insurance reported 32 visits used this year (evals not included)   Authorization Time Period Pt 55 daughter says insurance told her that pt had 18 more visits for the year, therapist conveyed that this is not consistnet with the information to therapist, pt and dtr would like to continue therapy anyway.   Authorization - Visit Number 16   OT Start Time 3662   OT Stop Time 1147   OT Time Calculation (min) 39 min   Activity Tolerance Patient tolerated treatment well      Past Medical History:  Diagnosis Date  . Diabetes mellitus without complication ([place])   . Hypertension   . Stroke [facility])     Past Surgical History:  Procedure Laterality Date  . CESAREAN SECTION    . TEE WITHOUT CARDIOVERSION N/A 08/10/2015   Procedure: TRANSESOPHAGEAL ECHOCARDIOGRAM (TEE);  Surgeon: [name], MD;  Location: [facility] END[facility]COPY;  Service: Cardiovascular;  Laterality: N/A;    There were no vitals filed for this visit.      Subjective Assessment - 10/05/16 1113    Patient is accompained by: Family member   Patient Stated Goals to get better, memory, focusing   Currently in Pain? No/denies                      OT Treatments/Exercises (OP) - 10/05/16 0001      ADLs   Overall ADLs Pt performing laundry task: starting washer with min cues to look at all dials on washer before  starting. Pt also started dryer - pt did correctly without cues.    Cooking Pt making grilled cheese sandwich with cues to use correct eye of stove and to use spatula for safety (vs. flipping with butter knife and hand). Otherwise, pt gathered ingredients and sequenced correctly as well as remembering to turn stove off.    ADL Comments Daughter had concerns with pt's memory in prep for return to work. Discussed long term disability as preferable option secondary to re-occuring strokes (has filed, but been denied. Encouraged to re-apply). However, briefly discussed memory strategies with patient and daughter for work tasks and if pt does return to work, recommending only starting part time. (daughter reports family will provide transportation to/from work)      Fine Education officer, environmental   Other Fine Motor Exercises Braiding string in prep for work related tasks with bilateral integration without difficulty                  OT Short Term Goals - 10/05/16 1206      OT [place] #1   Title Pt will be Mod I signs and symtpoms of CVA/TIA   Status Deferred     OT SHORT TERM GOAL #2   Title Pt will be Mod I home program RUE  supervision and mod cueing   Status Partially Met  09/27/16 per dtr  supervision and min cueing,     OT SHORT TERM GOAL #3   Title Pt/family will I state 2-3 pieces of a/e &/or DME for home use for increased independnece and safety   Status Achieved  09/27/16 verbalizes walker and tub bench     OT SHORT TERM GOAL #4   Title Patient will dress lower body with mod cueing and intermittent mod assistance following set up.   Time 4   Period Weeks   Status Achieved  07/17/16:  min-mod A per dtr report     OT SHORT TERM GOAL #5   Title Patient will dress upper body with set up and minimal assistance   Time 4   Period Weeks   Status Achieved  07/17/16: per dtr report     OT SHORT TERM GOAL #6   Title Pt will perform simple snack/ beverage prep or cold meal  prep with supervision/ min v.c.   Baseline currently not performing consistently   Time 4   Period Weeks   Status Achieved     OT SHORT TERM GOAL #7   Title Pt will safely navigate a moderately distracting environment and locate items with 90% or better accuracy.   Baseline 09/27/16-85%  in min distracting environment   Time 4   Period Weeks   Status New     OT SHORT TERM GOAL #8   Title Pt will demonstrate improved fine motor coordination with RUE as evidenced by decreasing 9 hole peg test score to 34 secs or less.   Baseline RUE 38.50 secs, LUE 26.19 secs   Time 4   Period Weeks   Status New     OT SHORT TERM GOAL  #9   TITLE Pt will perform visual perceptual tasks with a cognitive component with 90% or better accuracy.   Baseline basic 1.60M number cancellation with 96% accuracy   Time 4   Period Weeks   Status New           OT Long Term Goals - 09/27/16 1201      OT LONG TERM GOAL #1   Title Pt will be Min A LB/UB Dressing and ADL's   Baseline Mod-max A   Time 8   Period Weeks   Status Achieved     OT LONG TERM GOAL #2   Title Pt will be Mod I-supervision updated HEP RUE   Time 8   Period Weeks   Status Not Met  supervision- min v.c. for HEP     OT LONG TERM GOAL #3   Title Pt will demonstrate improved RUE strength as seen by 3+/5 MMT   Baseline -3/5 RUE   Time 8   Period Weeks   Status Achieved     OT LONG TERM GOAL #4   Title Pt will demonstrate improved R hand coordination and grip strength as seen by improved Box & Blocks to 30 or greater and grip 30# or greater R.   Baseline Grip 19#; Box and Blocks = 13 (06/27/16)   Time 8   Period Weeks   Status Achieved  38 blocks, grip RUE 50 lbs     OT LONG TERM GOAL #5   Title Pt will demonstrate improved functional mobility and balance as seen by supervision assist for simulated shower transfers   Baseline Moderate (06/27/16)   Time 8   Period Weeks   Status Achieved  08/17/16  Met per daughter       OT LONG TERM GOAL #  6   Title Pt will perform simple snack/ cold meal prep and basic home management at a modified independent level demonstrating good safety awareness.   Time 8   Period Weeks   Status New     OT LONG TERM GOAL #7   Title Pt / family will demonstrate understanding of updated HEP.   Time 8   Period Weeks   Status New     OT LONG TERM GOAL #8   Title Pt will demonstrate improved standing balance and RUE endurance to perfom functional reaching activities mid-high range x 10 mins in standing without rest break or LOB.   Time 8   Period Weeks   Status New               Plan - 10/05/16 1207    Clinical Impression Statement Pt progressing with familiar functional sequencing ADL tasks. Pt overall with supervision with cooking simple meal   Rehab Potential Fair   Clinical Impairments Affecting Rehab Potential severity of deficits; cognitive deficits   OT Frequency 2x / week   OT Duration 8 weeks   OT Treatment/Interventions Self-care/ADL training;Therapeutic exercise;DME and/or AE instruction;Therapeutic activities;Patient/family education;Balance training;Neuromuscular education;Energy conservation;Passive range of motion;Therapeutic exercises;Visual/perceptual remediation/compensation   Plan address/assess remaining STG's, consider jigsaw puzzle, environmental scanning. If time allows, or following session - address memory strategies and money exchange/change making in prep for work related tasks (if pt does return)   [place] 07/10/16:  cane and simple coordination HEP; recommendations for cutting with scissors, braiding, stringing beads   Consulted and Agree with Plan of Care Patient;Family member/caregiver   Family Member Consulted Daughter      Patient will benefit from skilled therapeutic intervention in order to improve the following deficits and impairments:  Decreased balance, Decreased endurance, Impaired vision/preception, Decreased range of  motion, Decreased knowledge of precautions, Decreased cognition, Decreased activity tolerance, Decreased coordination, Decreased knowledge of use of DME, Decreased safety awareness, Decreased strength, Impaired flexibility, Impaired UE functional use, Decreased mobility, Difficulty walking, Impaired perceived functional ability, Abnormal gait, Impaired sensation  Visit Diagnosis: Cognitive social or emotional deficit following cerebral infarction  Other lack of coordination    Problem List Patient Active Problem List   Diagnosis Date Noted  . Seizure-like activity ([place]) 06/28/2016  . Cerebral infarction due to unspecified mechanism   . Gait difficulty   . Memory loss 05/22/2016  . Confusion   . Acute ischemic stroke ([place]) 05/18/2016  . Morbid obesity ([place]) 09/30/2015  . Uncontrolled type 2 diabetes mellitus with circulatory disorder, without long-term current use of insulin ([place])   . Essential hypertension   . Cerebrovascular accident (CVA) due to thrombosis of cerebral artery ([place])   . CVA (cerebral infarction) 08/07/2015  . Pure hypercholesterolemia 10/04/2009    Carey Bullocks, OTR/L 10/05/2016, 12:09 PM  [facility] [address] [place], [name], [address] Phone: [phone]   Fax:  [phone]  Name: Annette Munoz MRN: [id number] Date of Birth: [birth year]

## 2016-10-06 ENCOUNTER — Ambulatory Visit: Payer: Commercial Managed Care - HMO | Admitting: Physical Therapy

## 2016-10-06 DIAGNOSIS — M6281 Muscle weakness (generalized): Secondary | ICD-10-CM

## 2016-10-06 DIAGNOSIS — R41841 Cognitive communication deficit: Secondary | ICD-10-CM | POA: Diagnosis not present

## 2016-10-06 DIAGNOSIS — R2689 Other abnormalities of gait and mobility: Secondary | ICD-10-CM

## 2016-10-06 NOTE — Therapy (Signed)
[facility] [address] [place], [name], [address] Phone: [phone]   Fax:  [phone]  Physical Therapy Treatment  Patient Details  Name: Annette Munoz MRN: [id number] Date of Birth: 06-19-1966 Referring Provider: Rosalin Hawking  Encounter Date: 10/06/2016      PT End of Session - 10/06/16 1013    Visit Number 18   Number of Visits 32   Date for PT Re-Evaluation 11/29/16   Authorization Type UHC 60 visit limit combined (per Almyra Free in front office report-18 visits combined remaining for PT/OT till year end 2017)   Authorization - Visit Number 3   Authorization - Number of Visits 9   PT Start Time [phone]  pt arrived late   PT Stop Time 1013   PT Time Calculation (min) 30 min   Activity Tolerance Patient tolerated treatment well   Behavior During Therapy [facility] for tasks assessed/performed      Past Medical History:  Diagnosis Date  . Diabetes mellitus without complication ([place])   . Hypertension   . Stroke [facility])     Past Surgical History:  Procedure Laterality Date  . CESAREAN SECTION    . TEE WITHOUT CARDIOVERSION N/A 08/10/2015   Procedure: TRANSESOPHAGEAL ECHOCARDIOGRAM (TEE);  Surgeon: [name], MD;  Location: [facility] ENDOSCOPY;  Service: Cardiovascular;  Laterality: N/A;    There were no vitals filed for this visit.      Subjective Assessment - 10/06/16 0945    Subjective doing well, no complaints today.   Pertinent History HTN, DM, CVA (x 2), with recent TIA per family report; weakness on R side new since 06/18/16 (Has been on hold for PT since 08/17/16 due to awaiting insurance verifications/clarification on visits remaining)   Patient Stated Goals Pt's goals for therapy are to "do everything I need to do."  Updated 09/29/16:  "To get rid of brace and walker"    Currently in Pain? No/denies                         [facility] Adult PT Treatment/Exercise - 10/06/16 0949      Knee/Hip  Exercises: Stretches   Other Knee/Hip Stretches seated piriformis stretch 3x30 sec     Knee/Hip Exercises: Seated   Long Arc Quad Right;10 reps;Strengthening   Long Arc Quad Weight 2 lbs.     Knee/Hip Exercises: Supine   Short Arc Quad Sets AROM;Strengthening;Right;10 reps;Limitations;1 set   Hip Adduction Isometric Right;Strengthening;10 reps   Straight Leg Raises AROM;Strengthening;Right;10 reps;Limitations;1 set   Other Supine Knee/Hip Exercises supine with bil legs extended out on mat: bil LE hip IR/ER active x 10 reps                PT Education - 10/06/16 1012    Education provided Yes   Education Details piriformis stretch   Person(s) Educated Patient   Methods Explanation   Comprehension Verbalized understanding          PT Short Term Goals - 10/03/16 1209      PT SHORT TERM GOAL #1   Title  UPDATED Goal:  Pt/family verblaize/demo progressive HEP for strength, balance, gait.  TARGET 10/29/16   Baseline Updated STGS per re-eval 09/29/16   Time 4   Period Weeks   Status New     PT SHORT TERM GOAL #2   Title  UPDATED Goal:  Pt will transfer 8 fo 10 reps no UE support, sit<>stand for improved transfer efficiency.  TARGET 10/29/16   Baseline per reeval 09/29/16   Time 4   Period Weeks   Status New     PT SHORT TERM GOAL #3   Title  UPDATED GOAL Improve TUG score to less than or equal to 20 seconds for decreased fall risk.  TARGET 10/29/16   Baseline 24.81 sec with RW and 21.76 sec with cane at reeval 09/29/16   Time 4   Period Weeks   Status New     PT SHORT TERM GOAL #4   Title Pt will negotiate at least 4 steps using 1 handrail, with min asistance, for improved safety with home stair negotiation.   Time 4   Period Weeks   Status Achieved     PT SHORT TERM GOAL #5   Title  UPDATED GOAL:  Pt will improve Berg score to at least 45/56 for decreased fall risk.  TARGET 10/29/16   Baseline 40/56 at reeval 09/29/16   Time 4   Period Weeks   Status New            PT Long Term Goals - 10/03/16 1210      PT LONG TERM GOAL #1   Title Pt/family will verbalize understanding of fall prevention within the home environment.  TARGET 08/25/16  UPDATED TARGET 11/29/16   Baseline Per re-eval 09/29/16   Time 8   Period Weeks   Status On-going     PT LONG TERM GOAL #2   Title  UPDATED GOAL:  Pt will improved gait velocity to at least 1.8 ft/sec with cane for improved household mobility.  TARGET 2/14/187   Baseline Met and revised per reeval 09/29/16-gait velocity 1.41 ft/sec with RW; 1.53 ft/sec with cane   Time 8   Period Weeks   Status Revised     PT LONG TERM GOAL #3   Title UPDATED GOAL:  Improve TUG score to less than or equal to 15 seconds for decreased fall risk.  TARGET 11/29/16   Baseline REvised as LTG per 09/29/16   Time 8   Period Weeks   Status Revised     PT LONG TERM GOAL #4   Title   UPDATED Goal:  DGI to be assessed and goal to be written as appropriate.  TARGET 11/29/16   Baseline Berg 40/56 at reeval 09/29/16 (new STG added to address BERG)   Time 8   Period Weeks   Status Revised     PT LONG TERM GOAL #5   Title UPDATED GOAL:  ambulate at least 1000 ft indoor/outdoor surfaces with supervision.  TARGET 11/29/16   Baseline 500 ft met with RW per pt's daughter report 09/29/16 re-eval   Time 8   Period Weeks   Status Revised     PT LONG TERM GOAL #6   Title Pt will negotiate at least 4 steps, 1 rail, with supervision of family, for improved safety with stair negotiation into/out of the home.  UPDATED TARGET 11/29/16   Time 8   Period Weeks   Status On-going               Plan - 10/06/16 1013    Clinical Impression Statement Reviewed seated and supine HEP and added piriformis stretch per daughter's request.  Added green theraband and recommended ankle weights to increase difficulty with exercises as supine and sitting now easy.  D/C'ed towel squeeze and supine ankle pumps.  Will plan to look at standing HEP to  assess for appropriateness.   PT  Treatment/Interventions ADLs/Self Care Home Management;Functional mobility training;Stair training;Gait training;DME Instruction;Therapeutic activities;Therapeutic exercise;Balance training;Neuromuscular re-education;Patient/family education;Orthotic Fit/Training   PT Next Visit Plan look at standing HEP and update PRN, gait training with SPC, needs DGI   Consulted and Agree with Plan of Care Patient      Patient will benefit from skilled therapeutic intervention in order to improve the following deficits and impairments:  Abnormal gait, Decreased balance, Decreased cognition, Decreased mobility, Decreased knowledge of use of DME, Decreased safety awareness, Decreased strength, Difficulty walking, Postural dysfunction, Impaired tone, Increased fascial restricitons  Visit Diagnosis: Muscle weakness (generalized)  Other abnormalities of gait and mobility     Problem List Patient Active Problem List   Diagnosis Date Noted  . Seizure-like activity ([place]) 06/28/2016  . Cerebral infarction due to unspecified mechanism   . Gait difficulty   . Memory loss 05/22/2016  . Confusion   . Acute ischemic stroke ([place]) 05/18/2016  . Morbid obesity ([place]) 09/30/2015  . Uncontrolled type 2 diabetes mellitus with circulatory disorder, without long-term current use of insulin ([place])   . Essential hypertension   . Cerebrovascular accident (CVA) due to thrombosis of cerebral artery ([place])   . CVA (cerebral infarction) 08/07/2015  . Pure hypercholesterolemia 10/04/2009      Laureen Abrahams, PT, DPT 10/06/16 10:16 AM    [facility] [address] [place], [name], [address] Phone: [phone]   Fax:  [phone]  Name: JOLENA KITTLE MRN: [id number] Date of Birth: 1966-03-14

## 2016-10-06 NOTE — Patient Instructions (Signed)
Piriformis Stretch, Sitting    Sit, right ankle on opposite knee, same-side hand on crossed knee. Push down on knee, keeping spine straight. Lean torso forward, with flat back, until tension is felt in hamstrings and gluteals of crossed-leg side. Hold _30__ seconds.  Repeat _2-3__ times per session. Do _several__ sessions per day.  Copyright  VHI. All rights reserved.

## 2016-10-11 ENCOUNTER — Ambulatory Visit: Payer: Commercial Managed Care - HMO

## 2016-10-11 DIAGNOSIS — R41841 Cognitive communication deficit: Secondary | ICD-10-CM

## 2016-10-11 NOTE — Therapy (Signed)
[facility] [address] [place], [name], [address] Phone: [phone]   Fax:  [phone]  Speech Language Pathology Treatment  Patient Details  Name: Annette Munoz MRN: [id number] Date of Birth: 02/27/1966 Referring Provider: Dr. Renaye RakersVeita Bland  Encounter Date: 10/11/2016      End of Session - 10/11/16 1109    Visit Number 18   Number of Visits 30   Date for SLP Re-Evaluation 12/15/16   SLP Start Time 1023   SLP Stop Time  1103   SLP Time Calculation (min) 40 min   Activity Tolerance Patient tolerated treatment well      Past Medical History:  Diagnosis Date  . Diabetes mellitus without complication (HCC)   . Hypertension   . Stroke [facility](HCC)     Past Surgical History:  Procedure Laterality Date  . CESAREAN SECTION    . TEE WITHOUT CARDIOVERSION N/A 08/10/2015   Procedure: TRANSESOPHAGEAL ECHOCARDIOGRAM (TEE);  Surgeon: Chrystie NoseKenneth C Hilty, MD;  [facility];  Service: Cardiovascular;  Laterality: N/A;    There were no vitals filed for this visit.      Subjective Assessment - 10/11/16 1033    Subjective Pt arrives with daughter, Janith LimaKadeesia.   Currently in Pain? No/denies               ADULT SLP TREATMENT - 10/11/16 1034      General Information   Behavior/Cognition Cooperative;Pleasant mood;Distractible;Requires cueing;Decreased sustained attention     Treatment Provided   Treatment provided Cognitive-Linquistic     Cognitive-Linquistic Treatment   Treatment focused on Cognition   Skilled Treatment Pt req'd cues to look in notebook for schedule for today. With mod cues she recalled what the menu was for Christmas. In her homework she req'd min-mod A for sustained and selective attention moreso after 7 minutes on therapy task. Pt with emergent awareness 60% of the time with her homework. At end of session pt needed another cue to look in her notebook.      Assessment / Recommendations /  Plan   Plan Continue with current plan of care     Progression Toward Goals   Progression toward goals Progressing toward goals            SLP Short Term Goals - 10/11/16 1213      SLP SHORT TERM GOAL #1   Title Pt will utilize external aids/memory book to be oriented to date, appointments and daily chores with occasional min A over 3 sessions   Baseline all STGs updated/renewed 09-27-16   Time 2   Period Weeks   Status On-going     SLP SHORT TERM GOAL #2   Title Pt/family will report use of external aids/memory book at home for ADL's and medication mangement over 3 sessions   Baseline one session 07-21-16, 07-28-16   Time 2   Period Weeks   Status On-going     SLP SHORT TERM GOAL #3   Title Pt will verbalize 3 cognitive impairments with mod A   Time 2   Period Weeks   Status On-going     SLP SHORT TERM GOAL #4   Title Pt will attend to simple cognitive linguistic tasks in distracting enviroment for 10 mintues with occasional min A   Time 2   Period Weeks   Status On-going          SLP Long Term Goals - 10/11/16 1214      SLP LONG TERM GOAL #  1   Title Pt/family will report use of external memory aids at home with rare min cues, for successful f/u on daily schedule, safety precautions, chores, meds, etc over 3 sessions.   Baseline 08/15/16, 11/2   Time 6  All LTGs renewed 09-27-16   Period Weeks   Status On-going     SLP LONG TERM GOAL #2   Title Pt will verbalize 3 safety precautions due to cognitive impairments with mod A.    Time 6   Period Weeks   Status On-going     SLP LONG TERM GOAL #3   Title Pt will solve simple reasoning, organization, time and money problems with 75% accuracy and occasional min A   Time 6   Period Weeks   Status On-going     SLP LONG TERM GOAL #4   Title Pt will demonstrate emergent awareness by using memory compensation during therapy session x2 sessions   Baseline 08/15/16   Time 6   Period Weeks   Status On-going           Plan - 10/11/16 1110    Clinical Impression Statement Pt demonstrate intermittent imrprovement in selective attention and sustained attention. Recommend continue skilled ST to maximize cognition to reduce caregiver burden and improve independence participating in family life.   Speech Therapy Frequency 2x / week   Duration --  8 weeks/ 30 visits   Treatment/Interventions Compensatory strategies;Patient/family education;Functional tasks;Cueing hierarchy;Internal/external aids;Cognitive reorganization;SLP instruction and feedback   Potential to Achieve Goals Fair   Potential Considerations Severity of impairments;Previous level of function   Consulted and Agree with Plan of Care Patient;Family member/caregiver   Family Member Consulted daughter      Patient will benefit from skilled therapeutic intervention in order to improve the following deficits and impairments:   Cognitive communication deficit    Problem List Patient Active Problem List   Diagnosis Date Noted  . Seizure-like activity (HCC) 06/28/2016  . Cerebral infarction due to unspecified mechanism   . Gait difficulty   . Memory loss 05/22/2016  . Confusion   . Acute ischemic stroke (HCC) 05/18/2016  . Morbid obesity (HCC) 09/30/2015  . Uncontrolled type 2 diabetes mellitus with circulatory disorder, without long-term current use of insulin (HCC)   . Essential hypertension   . Cerebrovascular accident (CVA) due to thrombosis of cerebral artery (HCC)   . CVA (cerebral infarction) 08/07/2015  . Pure hypercholesterolemia 10/04/2009    [facility],Pryor Guettler ,MS, CCC-SLP  10/11/2016, 12:17 PM  [facility] [address] [place], [name], [address] Phone: [phone]   Fax:  [phone]   Name: Annette Munoz MRN: [id number] Date of Birth: 09/03/1966

## 2016-10-11 NOTE — Patient Instructions (Signed)
  Please complete the assigned speech therapy homework and return it to your next session.  

## 2016-10-12 ENCOUNTER — Ambulatory Visit: Payer: Commercial Managed Care - HMO | Admitting: Physical Therapy

## 2016-10-12 ENCOUNTER — Ambulatory Visit: Payer: Commercial Managed Care - HMO | Admitting: Occupational Therapy

## 2016-10-12 DIAGNOSIS — R2689 Other abnormalities of gait and mobility: Secondary | ICD-10-CM

## 2016-10-12 DIAGNOSIS — R41841 Cognitive communication deficit: Secondary | ICD-10-CM | POA: Diagnosis not present

## 2016-10-12 DIAGNOSIS — R278 Other lack of coordination: Secondary | ICD-10-CM

## 2016-10-12 DIAGNOSIS — M6281 Muscle weakness (generalized): Secondary | ICD-10-CM

## 2016-10-12 DIAGNOSIS — I69315 Cognitive social or emotional deficit following cerebral infarction: Secondary | ICD-10-CM

## 2016-10-12 DIAGNOSIS — R41842 Visuospatial deficit: Secondary | ICD-10-CM

## 2016-10-12 DIAGNOSIS — R2681 Unsteadiness on feet: Secondary | ICD-10-CM

## 2016-10-12 NOTE — Therapy (Signed)
[facility] [address] [place], [name], [address] Phone: [phone]   Fax:  [phone]  Physical Therapy Treatment  Patient Details  Name: Annette Munoz MRN: [id number] Date of Birth: 05-27-1966 Referring Provider: Rosalin Hawking  Encounter Date: 10/12/2016      PT End of Session - 10/12/16 1505    Visit Number 19   Number of Visits 32   Date for PT Re-Evaluation 11/29/16   Authorization Type UHC 60 visit limit combined (per Almyra Free in front office report-18 visits combined remaining for PT/OT till year end 2017)   Authorization - Visit Number 4   Authorization - Number of Visits 9   PT Start Time 2774   PT Stop Time 1234   PT Time Calculation (min) 40 min   Activity Tolerance Patient tolerated treatment well   Behavior During Therapy [facility] for tasks assessed/performed      Past Medical History:  Diagnosis Date  . Diabetes mellitus without complication ([place])   . Hypertension   . Stroke [facility])     Past Surgical History:  Procedure Laterality Date  . CESAREAN SECTION    . TEE WITHOUT CARDIOVERSION N/A 08/10/2015   Procedure: TRANSESOPHAGEAL ECHOCARDIOGRAM (TEE);  Surgeon: [name], MD;  Location: [facility] ENDOSCOPY;  Service: Cardiovascular;  Laterality: N/A;    There were no vitals filed for this visit.      Subjective Assessment - 10/12/16 1156    Subjective No changes, no falls.  Daughter reports patient may have sinus infection.  They are monitoring.   Patient is accompained by: Family member  Daughter   Pertinent History HTN, DM, CVA (x 2), with recent TIA per family report; weakness on R side new since 06/18/16 (Has been on hold for PT since 08/17/16 due to awaiting insurance verifications/clarification on visits remaining)   Patient Stated Goals Pt's goals for therapy are to "do everything I need to do."  Updated 09/29/16:  "To get rid of brace and walker"    Currently in Pain? No/denies                          [facility] Adult PT Treatment/Exercise - 10/12/16 0001      Ambulation/Gait   Ambulation/Gait Yes   Ambulation/Gait Assistance 4: Min guard   Ambulation/Gait Assistance Details Intermittent cues for increased R foot clearance, increased R step length.  Provided cues to use cane in LUE in order for optimal cane sequence.   Ambulation Distance (Feet) 360 Feet  300 ft   Assistive device Straight cane   Gait Pattern Step-through pattern;Decreased step length - right;Decreased step length - left;Decreased stance time - right;Decreased dorsiflexion - right;Poor foot clearance - right   Ambulation Surface Level;Indoor   Stairs Yes   Stairs Assistance 4: Min guard   Stair Management Technique One rail Left;Alternating pattern;Step to pattern  alternating pattern ascending, step to pattern descending   Number of Stairs 4   Height of Stairs 6   Pre-Gait Activities Short distance gait 5 ft x 4 reps with turning to sit (using RW) during seated rest breaks    Gait Comments Daughter asks about when they can walk at home with cane (do not yet have cane at home).  PT discussed results of DGI, also discussed need to purchase [facility] for home.  Provided with info on DME purchases.  Discussed that patient/daughter can practice gait in next few sessions with further discussion about gait  with daughter's help at home.     Standardized Balance Assessment   Standardized Balance Assessment Dynamic Gait Index  Scores on DGI <19/24 indicative of increased fall risk.     Dynamic Gait Index   Level Surface Moderate Impairment   Change in Gait Speed Moderate Impairment   Gait with Horizontal Head Turns Moderate Impairment   Gait with Vertical Head Turns Moderate Impairment   Gait and Pivot Turn Severe Impairment  7.43 sec   Step Over Obstacle Moderate Impairment   Step Around Obstacles Moderate Impairment   Steps Moderate Impairment   Total Score 7             Balance  Exercises - 10/12/16 1456      Balance Exercises: Standing   Other Standing Exercises Review of standing balance HEP:  pt performs marching in place x 10 reps, sidestepping along counter with cues for hip/foot positioning; tandem gait along counter, braiding (with cues for foot placement); PT discharged tandem gait activity as part of HEP.  Pt performed/added to HEP:  alternating hip abduction x 10 reps, alternating hip extension x 10 reps.  Did not perform heel walking/toe walking due to patient wearing AFO (time constraints during session)           PT Education - 10/12/16 1504    Education provided Yes   Education Details Addition to standing HEP-see instructions; DME stores for obtaining cane   Person(s) Educated Patient;Child(ren)   Methods Explanation;Demonstration;Handout   Comprehension Verbalized understanding;Returned demonstration;Verbal cues required          PT Short Term Goals - 10/03/16 1209      PT SHORT TERM GOAL #1   Title  UPDATED Goal:  Pt/family verblaize/demo progressive HEP for strength, balance, gait.  TARGET 10/29/16   Baseline Updated STGS per re-eval 09/29/16   Time 4   Period Weeks   Status New     PT SHORT TERM GOAL #2   Title  UPDATED Goal:  Pt will transfer 8 fo 10 reps no UE support, sit<>stand for improved transfer efficiency.  TARGET 10/29/16   Baseline per reeval 09/29/16   Time 4   Period Weeks   Status New     PT SHORT TERM GOAL #3   Title  UPDATED GOAL Improve TUG score to less than or equal to 20 seconds for decreased fall risk.  TARGET 10/29/16   Baseline 24.81 sec with RW and 21.76 sec with cane at reeval 09/29/16   Time 4   Period Weeks   Status New     PT SHORT TERM GOAL #4   Title Pt will negotiate at least 4 steps using 1 handrail, with min asistance, for improved safety with home stair negotiation.   Time 4   Period Weeks   Status Achieved     PT SHORT TERM GOAL #5   Title  UPDATED GOAL:  Pt will improve Berg score to at  least 45/56 for decreased fall risk.  TARGET 10/29/16   Baseline 40/56 at reeval 09/29/16   Time 4   Period Weeks   Status New           PT Long Term Goals - 10/03/16 1210      PT LONG TERM GOAL #1   Title Pt/family will verbalize understanding of fall prevention within the home environment.  TARGET 08/25/16  UPDATED TARGET 11/29/16   Baseline Per re-eval 09/29/16   Time 8   Period Weeks   Status On-going  PT LONG TERM GOAL #2   Title  UPDATED GOAL:  Pt will improved gait velocity to at least 1.8 ft/sec with cane for improved household mobility.  TARGET 2/14/187   Baseline Met and revised per reeval 09/29/16-gait velocity 1.41 ft/sec with RW; 1.53 ft/sec with cane   Time 8   Period Weeks   Status Revised     PT LONG TERM GOAL #3   Title UPDATED GOAL:  Improve TUG score to less than or equal to 15 seconds for decreased fall risk.  TARGET 11/29/16   Baseline REvised as LTG per 09/29/16   Time 8   Period Weeks   Status Revised     PT LONG TERM GOAL #4   Title   UPDATED Goal:  DGI to be assessed and goal to be written as appropriate.  TARGET 11/29/16   Baseline Berg 40/56 at reeval 09/29/16 (new STG added to address BERG)   Time 8   Period Weeks   Status Revised     PT LONG TERM GOAL #5   Title UPDATED GOAL:  ambulate at least 1000 ft indoor/outdoor surfaces with supervision.  TARGET 11/29/16   Baseline 500 ft met with RW per pt's daughter report 09/29/16 re-eval   Time 8   Period Weeks   Status Revised     PT LONG TERM GOAL #6   Title Pt will negotiate at least 4 steps, 1 rail, with supervision of family, for improved safety with stair negotiation into/out of the home.  UPDATED TARGET 11/29/16   Time 8   Period Weeks   Status On-going               Plan - 10/12/16 1505    Clinical Impression Statement Reviewed standing HEP and added hip abduction and hip extension to HEP for additional standing exercises.  Pt scored 7/24 on DGI, with pt being at significant  fall risk with cane at this point.  Daughter aware of this and plans to continue gait training/family education on gait with cane at home as appropriate.   PT Treatment/Interventions ADLs/Self Care Home Management;Functional mobility training;Stair training;Gait training;DME Instruction;Therapeutic activities;Therapeutic exercise;Balance training;Neuromuscular re-education;Patient/family education;Orthotic Fit/Training   PT Next Visit Plan gait training with SPC with family; continue gait activities with cane; core stability exercises   Consulted and Agree with Plan of Care Patient      Patient will benefit from skilled therapeutic intervention in order to improve the following deficits and impairments:  Abnormal gait, Decreased balance, Decreased cognition, Decreased mobility, Decreased knowledge of use of DME, Decreased safety awareness, Decreased strength, Difficulty walking, Postural dysfunction, Impaired tone, Increased fascial restricitons  Visit Diagnosis: Other abnormalities of gait and mobility  Unsteadiness on feet  Muscle weakness (generalized)     Problem List Patient Active Problem List   Diagnosis Date Noted  . Seizure-like activity ([place]) 06/28/2016  . Cerebral infarction due to unspecified mechanism   . Gait difficulty   . Memory loss 05/22/2016  . Confusion   . Acute ischemic stroke ([place]) 05/18/2016  . Morbid obesity ([place]) 09/30/2015  . Uncontrolled type 2 diabetes mellitus with circulatory disorder, without long-term current use of insulin ([place])   . Essential hypertension   . Cerebrovascular accident (CVA) due to thrombosis of cerebral artery ([place])   . CVA (cerebral infarction) 08/07/2015  . Pure hypercholesterolemia 10/04/2009    Arlind Klingerman W. 10/12/2016, 3:11 PM  Frazier Butt., PT  [facility] [address] [place], [name],  [address] Phone: [phone]   Fax:  [phone]  Name: Annette Munoz MRN: [id number] Date of Birth: 07/11/1966

## 2016-10-12 NOTE — Therapy (Signed)
[facility] [address] [place], [name], [address] Phone: [phone]   Fax:  [phone]  Occupational Therapy Treatment  Patient Details  Name: Annette Munoz MRN: [id number] Date of Birth: Mar 08, 1966 Referring Provider: Veneda Melter. Candiss Norse  Encounter Date: 10/12/2016      OT End of Session - 10/12/16 1120    Visit Number 17   Number of Visits 30   Date for OT Re-Evaluation 11/26/16   Authorization Type [employer] 60 Visit Limit Combined (OT eval +14 speech treatments earlier this year) as of 10/15/16 insurance reported 72 visits used this year (evals not included)   Authorization Time Period Pt 's daughter says insurance told her that pt had 18 more visits for the year, therapist conveyed that this is not consistnet with the information to therapist, pt and dtr would like to continue therapy anyway.   Authorization - Visit Number 17   OT Start Time 1108   OT Stop Time 1150   OT Time Calculation (min) 42 min   Activity Tolerance Patient tolerated treatment well   Behavior During Therapy WFL for tasks assessed/performed      Past Medical History:  Diagnosis Date  . Diabetes mellitus without complication ([place])   . Hypertension   . Stroke [facility])     Past Surgical History:  Procedure Laterality Date  . CESAREAN SECTION    . TEE WITHOUT CARDIOVERSION N/A 08/10/2015   Procedure: TRANSESOPHAGEAL ECHOCARDIOGRAM (TEE);  Surgeon: [name], MD;  Location: [facility] ENDOSCOPY;  Service: Cardiovascular;  Laterality: N/A;    There were no vitals filed for this visit.      Subjective Assessment - 10/12/16 1111    Subjective  Dtr. reports that pt has not wanted to help in the kitchen   Patient is accompained by: Family member   Patient Stated Goals to get better, memory, focusing   Currently in Pain? No/denies      Grouping shopping list by where it would be found in the store.  Pt  needed min cueing for placement of 2-3 items in wrong category and for using strategy to check off items to avoid repetition/omissions.  Creating list of ingredients for meal that pt chose (with prompting) with cueing for omitting noodles (when making spaghetti).    Discussed activities to incr participation in IADLs at home--see pt instructions (in small, safe ways with gradual progression).  Copied small peg design using RUE for incr coordination with min cueing x2 for wrong color and peg placement for visual-perception and cognitive skills.  Environmental scanning in min distracting environment with 10/15 items found initially, then 3 additional found on 2nd attempt.  Needed min cueing for remaining 2 (on R side).  3/5 missed items were on R side.  Discussed progress and plan to continue for at least 4 more weeks, then may d/c due to save visits for later in the year.  Pt/dtr. In agreement.                       OT Education - 10/12/16 1216    Education Details Importance of incr IADL performance for improved functional problem solving, attention, sequencing, memory--see pt instructions   Person(s) Educated Patient;Child(ren)   Methods Explanation   Comprehension Verbalized understanding          OT Short Term Goals - 10/12/16 1223      OT SHORT TERM GOAL #1   Title Pt will  be Mod I signs and symtpoms of CVA/TIA   Status Deferred     OT SHORT TERM GOAL #2   Title Pt will be Mod I home program RUE  supervision and mod cueing   Status Partially Met  09/27/16 per dtr supervision and min cueing,     OT SHORT TERM GOAL #3   Title Pt/family will I state 2-3 pieces of a/e &/or DME for home use for increased independnece and safety   Status Achieved  09/27/16 verbalizes walker and tub bench     OT SHORT TERM GOAL #4   Title Patient will dress lower body with mod cueing and intermittent mod assistance following set up.   Time 4   Period Weeks   Status Achieved   07/17/16:  min-mod A per dtr report     OT SHORT TERM GOAL #5   Title Patient will dress upper body with set up and minimal assistance   Time 4   Period Weeks   Status Achieved  07/17/16: per dtr report     OT SHORT TERM GOAL #6   Title Pt will perform simple snack/ beverage prep or cold meal prep with supervision/ min v.c.   Baseline currently not performing consistently   Time 4   Period Weeks   Status Achieved     OT SHORT TERM GOAL #7   Title Pt will safely navigate a moderately distracting environment and locate items with 90% or better accuracy.   Baseline 09/27/16-85%  in min distracting environment   Time 4   Period Weeks   Status New     OT SHORT TERM GOAL #8   Title Pt will demonstrate improved fine motor coordination with RUE as evidenced by decreasing 9 hole peg test score to 34 secs or less.   Baseline RUE 38.50 secs, LUE 26.19 secs   Time 4   Period Weeks   Status New     OT SHORT TERM GOAL  #9   TITLE Pt will perform visual perceptual tasks with a cognitive component with 90% or better accuracy.   Baseline basic 1.5M number cancellation with 96% accuracy   Time 4   Period Weeks   Status Achieved  10/12/16 met at approx this level           OT Long Term Goals - 09/27/16 1201      OT LONG TERM GOAL #1   Title Pt will be Min A LB/UB Dressing and ADL's   Baseline Mod-max A   Time 8   Period Weeks   Status Achieved     OT LONG TERM GOAL #2   Title Pt will be Mod I-supervision updated HEP RUE   Time 8   Period Weeks   Status Not Met  supervision- min v.c. for HEP     OT LONG TERM GOAL #3   Title Pt will demonstrate improved RUE strength as seen by 3+/5 MMT   Baseline -3/5 RUE   Time 8   Period Weeks   Status Achieved     OT LONG TERM GOAL #4   Title Pt will demonstrate improved R hand coordination and grip strength as seen by improved Box & Blocks to 30 or greater and grip 30# or greater R.   Baseline Grip 19#; Box and Blocks = 13 (06/27/16)    Time 8   Period Weeks   Status Achieved  38 blocks, grip RUE 50 lbs     OT LONG TERM GOAL #  5   Title Pt will demonstrate improved functional mobility and balance as seen by supervision assist for simulated shower transfers   Baseline Moderate (06/27/16)   Time 8   Period Weeks   Status Achieved  08/17/16  Met per daughter      OT LONG TERM GOAL #6   Title Pt will perform simple snack/ cold meal prep and basic home management at a modified independent level demonstrating good safety awareness.   Time 8   Period Weeks   Status New     OT LONG TERM GOAL #7   Title Pt / family will demonstrate understanding of updated HEP.   Time 8   Period Weeks   Status New     OT LONG TERM GOAL #8   Title Pt will demonstrate improved standing balance and RUE endurance to perfom functional reaching activities mid-high range x 10 mins in standing without rest break or LOB.   Time 8   Period Weeks   Status New               Plan - 10/12/16 1218    Clinical Impression Statement Pt progressing with functional ADL planning tasks.  Recommended pt incr IADL participation with supervision.   Rehab Potential Fair   Clinical Impairments Affecting Rehab Potential severity of deficits; cognitive deficits   OT Frequency 2x / week   OT Duration 8 weeks   OT Treatment/Interventions Self-care/ADL training;Therapeutic exercise;DME and/or AE instruction;Therapeutic activities;Patient/family education;Balance training;Neuromuscular education;Energy conservation;Passive range of motion;Therapeutic exercises;Visual/perceptual remediation/compensation   Plan simple money exchange, environmental scanning   OT Home Exercise Plan 07/10/16:  cane and simple coordination HEP; recommendations for cutting with scissors, braiding, stringing beads   Consulted and Agree with Plan of Care Patient;Family member/caregiver   Family Member Consulted Daughter      Patient will benefit from skilled therapeutic  intervention in order to improve the following deficits and impairments:  Decreased balance, Decreased endurance, Impaired vision/preception, Decreased range of motion, Decreased knowledge of precautions, Decreased cognition, Decreased activity tolerance, Decreased coordination, Decreased knowledge of use of DME, Decreased safety awareness, Decreased strength, Impaired flexibility, Impaired UE functional use, Decreased mobility, Difficulty walking, Impaired perceived functional ability, Abnormal gait, Impaired sensation  Visit Diagnosis: Cognitive social or emotional deficit following cerebral infarction  Other lack of coordination  Visuospatial deficit  Unsteadiness on feet  Other abnormalities of gait and mobility    Problem List Patient Active Problem List   Diagnosis Date Noted  . Seizure-like activity (HCC) 06/28/2016  . Cerebral infarction due to unspecified mechanism   . Gait difficulty   . Memory loss 05/22/2016  . Confusion   . Acute ischemic stroke (HCC) 05/18/2016  . Morbid obesity (HCC) 09/30/2015  . Uncontrolled type 2 diabetes mellitus with circulatory disorder, without long-term current use of insulin (HCC)   . Essential hypertension   . Cerebrovascular accident (CVA) due to thrombosis of cerebral artery (HCC)   . CVA (cerebral infarction) 08/07/2015  . Pure hypercholesterolemia 10/04/2009    FREEMAN,ANGELA 10/12/2016, 12:47 PM  [facility] [address] [place], [place], [address] Phone: [phone]   Fax:  [phone]  Name: Sahily H Yepes MRN: [id number] Date of Birth: 11/23/1965    Angela Freeman, OTR/L [facility] [address] [place], [place]  [address] [phone] phone [phone] 10/12/16 12:47 PM    

## 2016-10-12 NOTE — Patient Instructions (Signed)
1.  Try picking out 3 meals for the week and writing down items needed.  2. Once you/your daughter make the shopping list together, organize list by where it would be found in grocery store.  3.  Pick 5-8 items on grocery list (highlight or *) and you find those things and get off shelf and put in cart (with family present)  4.  Help make the meals that you picked out (with family present).

## 2016-10-12 NOTE — Patient Instructions (Addendum)
Standing Hip Abduction    While standing, raise your leg out to the side. Keep your knee straight and maintain your toes pointed forward the entire time.  Repeat 10-15 times for 1-2 sets.  1 session per day.  Use your arms for support if needed for balance and safety.  Standing Hip Extension    While standing, balance on one leg and move your other leg in a backward direction. Do not swing the leg. Perform smooth and controlled movements.   Keep your trunk stable and without arching during the movement.  Repeat 10-15 times for 1-2 sets.  Perform 1 session per day.  Use your arms for support if needed for balance and safety.  You can use this to assist with balance by alternating your legs with these exercises.   Medical supply stores: [facility] on [address] [phone]  [facility] on [address] or in [place] [phone] for both locations  [facility] on [address] [phone]

## 2016-10-13 ENCOUNTER — Ambulatory Visit: Payer: Commercial Managed Care - HMO | Admitting: Physical Therapy

## 2016-10-13 DIAGNOSIS — R41841 Cognitive communication deficit: Secondary | ICD-10-CM | POA: Diagnosis not present

## 2016-10-13 DIAGNOSIS — R2681 Unsteadiness on feet: Secondary | ICD-10-CM

## 2016-10-13 DIAGNOSIS — R2689 Other abnormalities of gait and mobility: Secondary | ICD-10-CM

## 2016-10-13 NOTE — Therapy (Signed)
[facility] [address] [place], [name], [address] Phone: [phone]   Fax:  [phone]  Physical Therapy Treatment  Patient Details  Name: Annette Munoz MRN: [id number] Date of Birth: 1966-08-25 Referring Provider: Rosalin Hawking  Encounter Date: 10/13/2016      PT End of Session - 10/13/16 1255    Visit Number 20   Number of Visits 32   Date for PT Re-Evaluation 11/29/16   Authorization Type [employer] 60 visit limit combined (per Almyra Free in front office report-18 visits combined remaining for PT/OT till year end 2017)   Authorization - Visit Number 5   Authorization - Number of Visits 9   PT Start Time 6962  Pt arrives late   PT Stop Time 1146   PT Time Calculation (min) 34 min   Equipment Utilized During Treatment Gait belt   Activity Tolerance Patient tolerated treatment well   Behavior During Therapy [facility] for tasks assessed/performed      Past Medical History:  Diagnosis Date  . Diabetes mellitus without complication ([place])   . Hypertension   . Stroke [facility])     Past Surgical History:  Procedure Laterality Date  . CESAREAN SECTION    . TEE WITHOUT CARDIOVERSION N/A 08/10/2015   Procedure: TRANSESOPHAGEAL ECHOCARDIOGRAM (TEE);  Surgeon: [name], MD;  Location: [facility] ENDOSCOPY;  Service: Cardiovascular;  Laterality: N/A;    There were no vitals filed for this visit.      Subjective Assessment - 10/13/16 1117    Subjective No changes.  Have not gotten cane yet, but plan to look today.   Patient is accompained by: Family member  Daughter   Pertinent History HTN, DM, CVA (x 2), with recent TIA per family report; weakness on R side new since 06/18/16 (Has been on hold for PT since 08/17/16 due to awaiting insurance verifications/clarification on visits remaining)   Patient Stated Goals Pt's goals for therapy are to "do everything I need to do."  Updated 09/29/16:  "To get rid of brace and walker"    Currently  in Pain? No/denies                         [facility] Adult PT Treatment/Exercise - 10/13/16 0001      Ambulation/Gait   Ambulation/Gait Yes   Ambulation/Gait Assistance 4: Min guard   Ambulation/Gait Assistance Details Cues provided for increased R foot clearance, increased step length.     Ambulation Distance (Feet) 230 Feet  then 350 ft with turns around furniture   Assistive device Straight cane   Gait Pattern Step-through pattern;Decreased step length - right;Decreased step length - left;Decreased stance time - right;Decreased dorsiflexion - right;Poor foot clearance - right   Ambulation Surface Level;Indoor   Pre-Gait Activities Instructed daughter in use of gait belt, proper cane height and cane sequence with gait.  Instructed daughter in working with patient on short distance walking room to room or 2-3 minutes at a time, several times a day with cane for practice with gait with cane at home.  Reiterated to patient that she is not to walk with cane without assistance of daughter.  Daughter walks with patient for 350 ft in clinic with therapist supervision.     Gait Comments 3 minute walk test:  204 ft with cane.       High Level Balance   High Level Balance Comments In parallel bars standing on red foam mat:  forward walking  with cane in LUE 8 reps with min guard assistance with cues for foot clearance; forward step over obstacle x 10 reps alternating legs; side step over obstacle x 10 reps, with UE support      In parallel bars, static standing with cane support, 4 reps x 30 seconds with supervision.           PT Education - 10/13/16 1254    Education provided Yes   Education Details Daughter educated in/practiced walking with patient with [facility] for short distance home gait with cane   Person(s) Educated Patient;Child(ren)   Methods Explanation;Demonstration;Verbal cues   Comprehension Verbalized understanding;Returned demonstration          PT Short Term  Goals - 10/03/16 1209      PT SHORT TERM GOAL #1   Title  UPDATED Goal:  Pt/family verblaize/demo progressive HEP for strength, balance, gait.  TARGET 10/29/16   Baseline Updated STGS per re-eval 09/29/16   Time 4   Period Weeks   Status New     PT SHORT TERM GOAL #2   Title  UPDATED Goal:  Pt will transfer 8 fo 10 reps no UE support, sit<>stand for improved transfer efficiency.  TARGET 10/29/16   Baseline per reeval 09/29/16   Time 4   Period Weeks   Status New     PT SHORT TERM GOAL #3   Title  UPDATED GOAL Improve TUG score to less than or equal to 20 seconds for decreased fall risk.  TARGET 10/29/16   Baseline 24.81 sec with RW and 21.76 sec with cane at reeval 09/29/16   Time 4   Period Weeks   Status New     PT SHORT TERM GOAL #4   Title Pt will negotiate at least 4 steps using 1 handrail, with min asistance, for improved safety with home stair negotiation.   Time 4   Period Weeks   Status Achieved     PT SHORT TERM GOAL #5   Title  UPDATED GOAL:  Pt will improve Berg score to at least 45/56 for decreased fall risk.  TARGET 10/29/16   Baseline 40/56 at reeval 09/29/16   Time 4   Period Weeks   Status New           PT Long Term Goals - 10/03/16 1210      PT LONG TERM GOAL #1   Title Pt/family will verbalize understanding of fall prevention within the home environment.  TARGET 08/25/16  UPDATED TARGET 11/29/16   Baseline Per re-eval 09/29/16   Time 8   Period Weeks   Status On-going     PT LONG TERM GOAL #2   Title  UPDATED GOAL:  Pt will improved gait velocity to at least 1.8 ft/sec with cane for improved household mobility.  TARGET 2/14/187   Baseline Met and revised per reeval 09/29/16-gait velocity 1.41 ft/sec with RW; 1.53 ft/sec with cane   Time 8   Period Weeks   Status Revised     PT LONG TERM GOAL #3   Title UPDATED GOAL:  Improve TUG score to less than or equal to 15 seconds for decreased fall risk.  TARGET 11/29/16   Baseline REvised as LTG per  09/29/16   Time 8   Period Weeks   Status Revised     PT LONG TERM GOAL #4   Title   UPDATED Goal:  DGI to be assessed and goal to be written as appropriate.  TARGET 11/29/16  Baseline Berg 40/56 at reeval 09/29/16 (new STG added to address BERG)   Time 8   Period Weeks   Status Revised     PT LONG TERM GOAL #5   Title UPDATED GOAL:  ambulate at least 1000 ft indoor/outdoor surfaces with supervision.  TARGET 11/29/16   Baseline 500 ft met with RW per pt's daughter report 09/29/16 re-eval   Time 8   Period Weeks   Status Revised     PT LONG TERM GOAL #6   Title Pt will negotiate at least 4 steps, 1 rail, with supervision of family, for improved safety with stair negotiation into/out of the home.  UPDATED TARGET 11/29/16   Time 8   Period Weeks   Status On-going               Plan - 10/13/16 1255    Clinical Impression Statement Pt needs continued cues for improved R foot clearance and heelstrike with gait with SPC.  Pt's daughter appears safe with short distance gait practice with SPC (once they obtain cane) at home.  Pt will continue to benefit from further skilled PT to address balance, gait and strengthening.   PT Treatment/Interventions ADLs/Self Care Home Management;Functional mobility training;Stair training;Gait training;DME Instruction;Therapeutic activities;Therapeutic exercise;Balance training;Neuromuscular re-education;Patient/family education;Orthotic Fit/Training   PT Next Visit Plan Continue gait training with SPC ; continue gait activities with cane; core stability exercises   Consulted and Agree with Plan of Care Patient      Patient will benefit from skilled therapeutic intervention in order to improve the following deficits and impairments:  Abnormal gait, Decreased balance, Decreased cognition, Decreased mobility, Decreased knowledge of use of DME, Decreased safety awareness, Decreased strength, Difficulty walking, Postural dysfunction, Impaired tone,  Increased fascial restricitons  Visit Diagnosis: Other abnormalities of gait and mobility  Unsteadiness on feet     Problem List Patient Active Problem List   Diagnosis Date Noted  . Seizure-like activity ([place]) 06/28/2016  . Cerebral infarction due to unspecified mechanism   . Gait difficulty   . Memory loss 05/22/2016  . Confusion   . Acute ischemic stroke ([place]) 05/18/2016  . Morbid obesity ([place]) 09/30/2015  . Uncontrolled type 2 diabetes mellitus with circulatory disorder, without long-term current use of insulin ([place])   . Essential hypertension   . Cerebrovascular accident (CVA) due to thrombosis of cerebral artery ([place])   . CVA (cerebral infarction) 08/07/2015  . Pure hypercholesterolemia 10/04/2009    MARRIOTT,AMY W. 10/13/2016, 12:58 PM  [facility] [address] [place], [name], [address] Phone: [phone]   Fax:  [phone]  Name: Annette Munoz MRN: [id number] Date of Birth: [birth year]

## 2016-10-19 ENCOUNTER — Ambulatory Visit: Payer: Commercial Managed Care - HMO | Admitting: Occupational Therapy

## 2016-10-19 ENCOUNTER — Encounter: Payer: Self-pay | Admitting: Physical Therapy

## 2016-10-19 ENCOUNTER — Ambulatory Visit: Payer: Commercial Managed Care - HMO | Attending: Family Medicine | Admitting: Physical Therapy

## 2016-10-19 DIAGNOSIS — R2681 Unsteadiness on feet: Secondary | ICD-10-CM | POA: Diagnosis not present

## 2016-10-19 DIAGNOSIS — M6281 Muscle weakness (generalized): Secondary | ICD-10-CM | POA: Diagnosis not present

## 2016-10-19 DIAGNOSIS — R41842 Visuospatial deficit: Secondary | ICD-10-CM | POA: Insufficient documentation

## 2016-10-19 DIAGNOSIS — R2689 Other abnormalities of gait and mobility: Secondary | ICD-10-CM | POA: Diagnosis not present

## 2016-10-19 DIAGNOSIS — I69315 Cognitive social or emotional deficit following cerebral infarction: Secondary | ICD-10-CM | POA: Insufficient documentation

## 2016-10-19 DIAGNOSIS — R278 Other lack of coordination: Secondary | ICD-10-CM | POA: Insufficient documentation

## 2016-10-19 DIAGNOSIS — R41841 Cognitive communication deficit: Secondary | ICD-10-CM | POA: Insufficient documentation

## 2016-10-19 NOTE — Therapy (Signed)
[facility] [address] [place], [name], [address] Phone: [phone]   Fax:  [phone]  Physical Therapy Treatment  Patient Details  Name: Annette Munoz MRN: [id number] Date of Birth: 08-11-1966 Referring Provider: Rosalin Hawking  Encounter Date: 10/19/2016      PT End of Session - 10/19/16 1028    Visit Number 21   Number of Visits 32   Date for PT Re-Evaluation 11/29/16   Authorization Type [employer] 60 visit limit combined- started over with 2018 year   Authorization - Visit Number 1   Authorization - Number of Visits 60   PT Start Time 1020  pt late for appt   PT Stop Time 1058   PT Time Calculation (min) 38 min   Equipment Utilized During Treatment Gait belt   Activity Tolerance Patient tolerated treatment well   Behavior During Therapy WFL for tasks assessed/performed      Past Medical History:  Diagnosis Date  . Diabetes mellitus without complication ([place])   . Hypertension   . Stroke [facility])     Past Surgical History:  Procedure Laterality Date  . CESAREAN SECTION    . TEE WITHOUT CARDIOVERSION N/A 08/10/2015   Procedure: TRANSESOPHAGEAL ECHOCARDIOGRAM (TEE);  Surgeon: [name], MD;  Location: [facility] ENDOSCOPY;  Service: Cardiovascular;  Laterality: N/A;    There were no vitals filed for this visit.      Subjective Assessment - 10/19/16 1027    Subjective No new complaints. No falls or pain to report. Daughter reports they are getting the cane today.   Patient is accompained by: Family member  daughter   Pertinent History HTN, DM, CVA (x 2), with recent TIA per family report; weakness on R side new since 06/18/16 (Has been on hold for PT since 08/17/16 due to awaiting insurance verifications/clarification on visits remaining)   Patient Stated Goals Pt's goals for therapy are to "do everything I need to do."  Updated 09/29/16:  "To get rid of brace and walker"    Currently in Pain? No/denies   Pain  Score 0-No pain            [facility] Adult PT Treatment/Exercise - 10/19/16 1036      Transfers   Transfers Sit to Stand;Stand to Sit   Number of Reps 10 reps;Other sets (comment)  3 sets   Comments 2 sets of 10 reps with red band tied around legs just above knee, pt keeping band pulled tight for increased glut activation/hip strengthening; 1 set of 10 with ball squeezes, cues on technique.                  Ambulation/Gait   Ambulation/Gait Yes   Ambulation/Gait Assistance 4: Min guard   Ambulation/Gait Assistance Details cues for increaesed right step length and for increased right foot clearance with gait   Ambulation Distance (Feet) 230 Feet  x 2   Assistive device Straight cane   Gait Pattern Step-through pattern;Decreased step length - right;Decreased step length - left;Decreased stance time - right;Decreased dorsiflexion - right;Poor foot clearance - right   Ambulation Surface Level;Indoor             Balance Exercises - 10/19/16 1049      Balance Exercises: Standing   Standing Eyes Closed Narrow base of support (BOS);Head turns;Foam/compliant surface;Other reps (comment);Limitations   Balance Beam standing across red/gray foam balance beam: alternating fwd stepping to floor and back onto beam x 10 each leg with  min assist for balance.     Balance Exercises: Standing   Standing Eyes Closed Limitations standing on air ex with narrow BOS: EC no head movements 10-15 sec's x 3 reps, EC with head movements left<>right and up<>down x 10 reps each. min assist for balance with cues on posture and weight shifing to assist with balance.                                        PT Short Term Goals - 10/03/16 1209      PT SHORT TERM GOAL #1   Title  UPDATED Goal:  Pt/family verblaize/demo progressive HEP for strength, balance, gait.  TARGET 10/29/16   Baseline Updated STGS per re-eval 09/29/16   Time 4   Period Weeks   Status New     PT SHORT TERM GOAL #2   Title  UPDATED  Goal:  Pt will transfer 8 fo 10 reps no UE support, sit<>stand for improved transfer efficiency.  TARGET 10/29/16   Baseline per reeval 09/29/16   Time 4   Period Weeks   Status New     PT SHORT TERM GOAL #3   Title  UPDATED GOAL Improve TUG score to less than or equal to 20 seconds for decreased fall risk.  TARGET 10/29/16   Baseline 24.81 sec with RW and 21.76 sec with cane at reeval 09/29/16   Time 4   Period Weeks   Status New     PT SHORT TERM GOAL #4   Title Pt will negotiate at least 4 steps using 1 handrail, with min asistance, for improved safety with home stair negotiation.   Time 4   Period Weeks   Status Achieved     PT SHORT TERM GOAL #5   Title  UPDATED GOAL:  Pt will improve Berg score to at least 45/56 for decreased fall risk.  TARGET 10/29/16   Baseline 40/56 at reeval 09/29/16   Time 4   Period Weeks   Status New           PT Long Term Goals - 10/03/16 1210      PT LONG TERM GOAL #1   Title Pt/family will verbalize understanding of fall prevention within the home environment.  TARGET 08/25/16  UPDATED TARGET 11/29/16   Baseline Per re-eval 09/29/16   Time 8   Period Weeks   Status On-going     PT LONG TERM GOAL #2   Title  UPDATED GOAL:  Pt will improved gait velocity to at least 1.8 ft/sec with cane for improved household mobility.  TARGET 2/14/187   Baseline Met and revised per reeval 09/29/16-gait velocity 1.41 ft/sec with RW; 1.53 ft/sec with cane   Time 8   Period Weeks   Status Revised     PT LONG TERM GOAL #3   Title UPDATED GOAL:  Improve TUG score to less than or equal to 15 seconds for decreased fall risk.  TARGET 11/29/16   Baseline REvised as LTG per 09/29/16   Time 8   Period Weeks   Status Revised     PT LONG TERM GOAL #4   Title   UPDATED Goal:  DGI to be assessed and goal to be written as appropriate.  TARGET 11/29/16   Baseline Berg 40/56 at reeval 09/29/16 (new STG added to address BERG)   Time 8   Period Weeks  Status Revised      PT LONG TERM GOAL #5   Title UPDATED GOAL:  ambulate at least 1000 ft indoor/outdoor surfaces with supervision.  TARGET 11/29/16   Baseline 500 ft met with RW per pt's daughter report 09/29/16 re-eval   Time 8   Period Weeks   Status Revised     PT LONG TERM GOAL #6   Title Pt will negotiate at least 4 steps, 1 rail, with supervision of family, for improved safety with stair negotiation into/out of the home.  UPDATED TARGET 11/29/16   Time 8   Period Weeks   Status On-going           Plan - 10/19/16 1028    Clinical Impression Statement today's skilled session continued to address gait with straight cane and balance. Pt improving with use of cane, no longer needs cues on sequencing, as well as no knee buckling noted with gait today. Pt continues to be challenged by balance activities on compliant surfaces. Pt is making steady progress toward goals and should benefit from continued PT to progress toward unmet goals.                                       PT Treatment/Interventions ADLs/Self Care Home Management;Functional mobility training;Stair training;Gait training;DME Instruction;Therapeutic activities;Therapeutic exercise;Balance training;Neuromuscular re-education;Patient/family education;Orthotic Fit/Training   PT Next Visit Plan Continue gait training with SPC ; continue gait activities with cane; core stability exercises   Consulted and Agree with Plan of Care Patient      Patient will benefit from skilled therapeutic intervention in order to improve the following deficits and impairments:  Abnormal gait, Decreased balance, Decreased cognition, Decreased mobility, Decreased knowledge of use of DME, Decreased safety awareness, Decreased strength, Difficulty walking, Postural dysfunction, Impaired tone, Increased fascial restricitons  Visit Diagnosis: Other abnormalities of gait and mobility  Unsteadiness on feet  Muscle weakness (generalized)     Problem List Patient  Active Problem List   Diagnosis Date Noted  . Seizure-like activity ([place]) 06/28/2016  . Cerebral infarction due to unspecified mechanism   . Gait difficulty   . Memory loss 05/22/2016  . Confusion   . Acute ischemic stroke ([place]) 05/18/2016  . Morbid obesity ([place]) 09/30/2015  . Uncontrolled type 2 diabetes mellitus with circulatory disorder, without long-term current use of insulin ([place])   . Essential hypertension   . Cerebrovascular accident (CVA) due to thrombosis of cerebral artery ([place])   . CVA (cerebral infarction) 08/07/2015  . Pure hypercholesterolemia 10/04/2009    Willow Ora, PTA, [facility] [address] [place], [place] [address] [phone] 10/19/16, 10:31 PM   Name: Annette Munoz MRN: [id number] Date of Birth: July 25, 1966

## 2016-10-20 ENCOUNTER — Ambulatory Visit: Payer: Commercial Managed Care - HMO | Admitting: Physical Therapy

## 2016-10-20 ENCOUNTER — Ambulatory Visit: Payer: Commercial Managed Care - HMO | Admitting: Occupational Therapy

## 2016-10-20 DIAGNOSIS — R41842 Visuospatial deficit: Secondary | ICD-10-CM | POA: Diagnosis not present

## 2016-10-20 DIAGNOSIS — M6281 Muscle weakness (generalized): Secondary | ICD-10-CM

## 2016-10-20 DIAGNOSIS — R278 Other lack of coordination: Secondary | ICD-10-CM

## 2016-10-20 DIAGNOSIS — R2689 Other abnormalities of gait and mobility: Secondary | ICD-10-CM

## 2016-10-20 DIAGNOSIS — R2681 Unsteadiness on feet: Secondary | ICD-10-CM

## 2016-10-20 DIAGNOSIS — I69315 Cognitive social or emotional deficit following cerebral infarction: Secondary | ICD-10-CM

## 2016-10-20 NOTE — Therapy (Signed)
[facility] [address] [place], [name], [address] Phone: [phone]   Fax:  [phone]  Occupational Therapy Treatment  Patient Details  Name: Annette Munoz MRN: [id number] Date of Birth: [birth year] Referring Provider: Veneda Melter. Candiss Norse  Encounter Date: 10/20/2016      OT End of Session - 10/20/16 1258    Visit Number 18   Number of Visits 30   Date for OT Re-Evaluation 11/26/16   Authorization Type [employer] 60 Visit Limit Combined (OT eval +14 speech treatments earlier this year) as of 10/15/16 insurance reported 55 visits used this year (evals not included)   Authorization - Visit Number 1  per dtr visits start over in new year   Authorization - Number of Visits 15   OT Start Time 1146   OT Stop Time 1230   OT Time Calculation (min) 44 min   Activity Tolerance Patient tolerated treatment well   Behavior During Therapy WFL for tasks assessed/performed      Past Medical History:  Diagnosis Date  . Diabetes mellitus without complication ([place])   . Hypertension   . Stroke [facility])     Past Surgical History:  Procedure Laterality Date  . CESAREAN SECTION    . TEE WITHOUT CARDIOVERSION N/A 08/10/2015   Procedure: TRANSESOPHAGEAL ECHOCARDIOGRAM (TEE);  Surgeon: [name], MD;  Location: [facility] ENDOSCOPY;  Service: Cardiovascular;  Laterality: N/A;    There were no vitals filed for this visit.      Subjective Assessment - 10/20/16 1157    Patient is accompained by: (P)  Family member   Patient Stated Goals (P)  to get better, memory, focusing   Currently in Pain? (P)  No/denies         Treatment: attempted changemaking worksheet, however mod-max difficulty. Pt's dtr reports pt counted out in her head. Pt completed a basic work sheet with addition and completing times on clock faces, pt made errors on 3/5 clock faces and omitted 3 math problems because she did not see  tham. After cueing pt correctly completed items.  Making change using play money, mod-max v.c. Cues initally, then pt demonstrated improvement. Envirnmental scanning pt located 9/10 items on first pass, pt required v.c. To locate the remaining item.                       OT Short Term Goals - 10/12/16 1223      OT SHORT TERM GOAL #1   Title Pt will be Mod I signs and symtpoms of CVA/TIA   Status Deferred     OT SHORT TERM GOAL #2   Title Pt will be Mod I home program RUE  supervision and mod cueing   Status Partially Met  09/27/16 per dtr supervision and min cueing,     OT SHORT TERM GOAL #3   Title Pt/family will I state 2-3 pieces of a/e &/or DME for home use for increased independnece and safety   Status Achieved  09/27/16 verbalizes walker and tub bench     OT SHORT TERM GOAL #4   Title Patient will dress lower body with mod cueing and intermittent mod assistance following set up.   Time 4   Period Weeks   Status Achieved  07/17/16:  min-mod A per dtr report     OT SHORT TERM GOAL #5   Title Patient will dress upper body with set up and minimal assistance  Time 4   Period Weeks   Status Achieved  07/17/16: per dtr report     OT SHORT TERM GOAL #6   Title Pt will perform simple snack/ beverage prep or cold meal prep with supervision/ min v.c.   Baseline currently not performing consistently   Time 4   Period Weeks   Status Achieved     OT SHORT TERM GOAL #7   Title Pt will safely navigate a moderately distracting environment and locate items with 90% or better accuracy.   Baseline 09/27/16-85%  in min distracting environment   Time 4   Period Weeks   Status New     OT SHORT TERM GOAL #8   Title Pt will demonstrate improved fine motor coordination with RUE as evidenced by decreasing 9 hole peg test score to 34 secs or less.   Baseline RUE 38.50 secs, LUE 26.19 secs   Time 4   Period Weeks   Status New     OT SHORT TERM GOAL  #9   TITLE Pt  will perform visual perceptual tasks with a cognitive component with 90% or better accuracy.   Baseline basic 1.91M number cancellation with 96% accuracy   Time 4   Period Weeks   Status Achieved  10/12/16 met at approx this level           OT Long Term Goals - 09/27/16 1201      OT LONG TERM GOAL #1   Title Pt will be Min A LB/UB Dressing and ADL's   Baseline Mod-max A   Time 8   Period Weeks   Status Achieved     OT LONG TERM GOAL #2   Title Pt will be Mod I-supervision updated HEP RUE   Time 8   Period Weeks   Status Not Met  supervision- min v.c. for HEP     OT LONG TERM GOAL #3   Title Pt will demonstrate improved RUE strength as seen by 3+/5 MMT   Baseline -3/5 RUE   Time 8   Period Weeks   Status Achieved     OT LONG TERM GOAL #4   Title Pt will demonstrate improved R hand coordination and grip strength as seen by improved Box & Blocks to 30 or greater and grip 30# or greater R.   Baseline Grip 19#; Box and Blocks = 13 (06/27/16)   Time 8   Period Weeks   Status Achieved  38 blocks, grip RUE 50 lbs     OT LONG TERM GOAL #5   Title Pt will demonstrate improved functional mobility and balance as seen by supervision assist for simulated shower transfers   Baseline Moderate (06/27/16)   Time 8   Period Weeks   Status Achieved  08/17/16  Met per daughter      OT LONG TERM GOAL #6   Title Pt will perform simple snack/ cold meal prep and basic home management at a modified independent level demonstrating good safety awareness.   Time 8   Period Weeks   Status New     OT LONG TERM GOAL #7   Title Pt / family will demonstrate understanding of updated HEP.   Time 8   Period Weeks   Status New     OT LONG TERM GOAL #8   Title Pt will demonstrate improved standing balance and RUE endurance to perfom functional reaching activities mid-high range x 10 mins in standing without rest break or LOB.   Time  8   Period Weeks   Status New               Plan  - 10/20/16 1255    Clinical Impression Statement Pt is progressing towards goals with improved basic environmental scanning. Pt can benefit from continued education regarding change making.   Rehab Potential Fair   Clinical Impairments Affecting Rehab Potential severity of deficits; cognitive deficits   OT Frequency 2x / week   OT Duration 8 weeks   OT Treatment/Interventions Self-care/ADL training;Therapeutic exercise;DME and/or AE instruction;Therapeutic activities;Patient/family education;Balance training;Neuromuscular education;Energy conservation;Passive range of motion;Therapeutic exercises;Visual/perceptual remediation/compensation   Plan change making using money to simulate, work towards goals.   OT Home Exercise Plan 07/10/16:  cane and simple coordination HEP; recommendations for cutting with scissors, braiding, stringing beads   Consulted and Agree with Plan of Care Patient;Family member/caregiver   Family Member Consulted Daughter      Patient will benefit from skilled therapeutic intervention in order to improve the following deficits and impairments:  Decreased balance, Decreased endurance, Impaired vision/preception, Decreased range of motion, Decreased knowledge of precautions, Decreased cognition, Decreased activity tolerance, Decreased coordination, Decreased knowledge of use of DME, Decreased safety awareness, Decreased strength, Impaired flexibility, Impaired UE functional use, Decreased mobility, Difficulty walking, Impaired perceived functional ability, Abnormal gait, Impaired sensation  Visit Diagnosis: Muscle weakness (generalized)  Cognitive social or emotional deficit following cerebral infarction  Other lack of coordination  Visuospatial deficit    Problem List Patient Active Problem List   Diagnosis Date Noted  . Seizure-like activity ([place]) 06/28/2016  . Cerebral infarction due to unspecified mechanism   . Gait difficulty   . Memory loss 05/22/2016  .  Confusion   . Acute ischemic stroke ([place]) 05/18/2016  . Morbid obesity ([place]) 09/30/2015  . Uncontrolled type 2 diabetes mellitus with circulatory disorder, without long-term current use of insulin ([place])   . Essential hypertension   . Cerebrovascular accident (CVA) due to thrombosis of cerebral artery ([place])   . CVA (cerebral infarction) 08/07/2015  . Pure hypercholesterolemia 10/04/2009    RINE,KATHRYN 10/20/2016, 1:00 PM  [facility] [address] [place], [name], [address] Phone: [phone]   Fax:  [phone]  Name: Annette Munoz MRN: [id number] Date of Birth: [birth year]

## 2016-10-20 NOTE — Therapy (Signed)
[facility] [address] [place], [name], [address] Phone: [phone]   Fax:  [phone]  Physical Therapy Treatment  Patient Details  Name: Annette Munoz MRN: [id number] Date of Birth: 01-Sep-1966 Referring Provider: Rosalin Hawking  Encounter Date: 10/20/2016      PT End of Session - 10/20/16 1148    Visit Number 22   Number of Visits 32   Date for PT Re-Evaluation 11/29/16   Authorization Type [employer] 60 visit limit combined- started over with 2018 year   Authorization - Visit Number 2   Authorization - Number of Visits 60   PT Start Time 1104   PT Stop Time 1143   PT Time Calculation (min) 39 min   Equipment Utilized During Treatment Gait belt   Activity Tolerance Patient tolerated treatment well   Behavior During Therapy WFL for tasks assessed/performed      Past Medical History:  Diagnosis Date  . Diabetes mellitus without complication ([place])   . Hypertension   . Stroke [facility])     Past Surgical History:  Procedure Laterality Date  . CESAREAN SECTION    . TEE WITHOUT CARDIOVERSION N/A 08/10/2015   Procedure: TRANSESOPHAGEAL ECHOCARDIOGRAM (TEE);  Surgeon: [name], MD;  Location: [facility] ENDOSCOPY;  Service: Cardiovascular;  Laterality: N/A;    There were no vitals filed for this visit.      Subjective Assessment - 10/20/16 1107    Subjective Purchased single point cane yesterday.  No concerns.  Would like to practice with SPC today.   Patient is accompained by: Family member   Currently in Pain? No/denies                         [facility] Adult PT Treatment/Exercise - 10/20/16 1109      Transfers   Transfers Sit to Stand;Stand to Sit   Sit to Stand 4: Min guard;Without upper extremity assist   Sit to Stand Details Tactile cues for weight shifting;Verbal cues for technique   Stand to Sit 4: Min guard   Number of Reps 10 reps   Comments 10 reps feet on solid surface, 10 reps feet on  compliant surface; on compliant surface pt with posterior LOB and inefficient use of hip strategy to maintain balance     Ambulation/Gait   Ambulation/Gait Yes   Ambulation/Gait Assistance 4: Min guard   Ambulation/Gait Assistance Details Cues for increased stance time RLE and increased step length and clearance LLE.   Ambulation Distance (Feet) 230 Feet   Assistive device Straight cane   Gait Pattern Step-through pattern;Decreased step length - left;Decreased stance time - right   Ambulation Surface Level;Indoor   Stairs Yes   Stairs Assistance 4: Min assist   Stairs Assistance Details (indicate cue type and reason) Verbal cues to recall safe step to sequence and cues for step over step sequence when ascending   Stair Management Technique One rail Left;Alternating pattern;Step to pattern;Forwards;With cane   Number of Stairs 4  x 3 reps   Height of Stairs 6   Ramp 4: Min assist   Ramp Details (indicate cue type and reason) decreased foot clearance noted   Curb 4: Min assist   Curb Details (indicate cue type and reason) verbal cues required for safe sequencing; pt noted to be safer ascending with RLE leading.  Cues for LLE leading to descend     Knee/Hip Exercises: Supine   Bridges Limitations with LE on peanut  ball performing bridges with hamstring curl x 10 reps with cues to maintain hips elevated during curl   Other Supine Knee/Hip Exercises Tall kneeling on mat without UE support during: squats x 10 with UE flexion, rotations L and R x 10, full UE flexion x 10 reps, maintaining tall kneeling during external perturbations x 1 minute               PT Education - 10/20/16 1147    Education provided Yes   Education Details Educated on continued use of RW with hopeful transition to [facility] next 1-2 sessions.     Person(s) Educated Patient;Child(ren)   Methods Explanation   Comprehension Verbalized understanding          PT Short Term Goals - 10/03/16 1209      PT SHORT TERM  GOAL #1   Title  UPDATED Goal:  Pt/family verblaize/demo progressive HEP for strength, balance, gait.  TARGET 10/29/16   Baseline Updated STGS per re-eval 09/29/16   Time 4   Period Weeks   Status New     PT SHORT TERM GOAL #2   Title  UPDATED Goal:  Pt will transfer 8 fo 10 reps no UE support, sit<>stand for improved transfer efficiency.  TARGET 10/29/16   Baseline per reeval 09/29/16   Time 4   Period Weeks   Status New     PT SHORT TERM GOAL #3   Title  UPDATED GOAL Improve TUG score to less than or equal to 20 seconds for decreased fall risk.  TARGET 10/29/16   Baseline 24.81 sec with RW and 21.76 sec with cane at reeval 09/29/16   Time 4   Period Weeks   Status New     PT SHORT TERM GOAL #4   Title Pt will negotiate at least 4 steps using 1 handrail, with min asistance, for improved safety with home stair negotiation.   Time 4   Period Weeks   Status Achieved     PT SHORT TERM GOAL #5   Title  UPDATED GOAL:  Pt will improve Berg score to at least 45/56 for decreased fall risk.  TARGET 10/29/16   Baseline 40/56 at reeval 09/29/16   Time 4   Period Weeks   Status New           PT Long Term Goals - 10/03/16 1210      PT LONG TERM GOAL #1   Title Pt/family will verbalize understanding of fall prevention within the home environment.  TARGET 08/25/16  UPDATED TARGET 11/29/16   Baseline Per re-eval 09/29/16   Time 8   Period Weeks   Status On-going     PT LONG TERM GOAL #2   Title  UPDATED GOAL:  Pt will improved gait velocity to at least 1.8 ft/sec with cane for improved household mobility.  TARGET 2/14/187   Baseline Met and revised per reeval 09/29/16-gait velocity 1.41 ft/sec with RW; 1.53 ft/sec with cane   Time 8   Period Weeks   Status Revised     PT LONG TERM GOAL #3   Title UPDATED GOAL:  Improve TUG score to less than or equal to 15 seconds for decreased fall risk.  TARGET 11/29/16   Baseline REvised as LTG per 09/29/16   Time 8   Period Weeks   Status  Revised     PT LONG TERM GOAL #4   Title   UPDATED Goal:  DGI to be assessed and goal to be written  as appropriate.  TARGET 11/29/16   Baseline Berg 40/56 at reeval 09/29/16 (new STG added to address BERG)   Time 8   Period Weeks   Status Revised     PT LONG TERM GOAL #5   Title UPDATED GOAL:  ambulate at least 1000 ft indoor/outdoor surfaces with supervision.  TARGET 11/29/16   Baseline 500 ft met with RW per pt's daughter report 09/29/16 re-eval   Time 8   Period Weeks   Status Revised     PT LONG TERM GOAL #6   Title Pt will negotiate at least 4 steps, 1 rail, with supervision of family, for improved safety with stair negotiation into/out of the home.  UPDATED TARGET 11/29/16   Time 8   Period Weeks   Status On-going               Plan - 10/20/16 1150    Clinical Impression Statement Focus on continued safety and gait training with SPC (purchased yesterday) over level ground, stair negotiation, ramp and curb.  Pt continues to require verbal cues and min guard for multiple tasks for balance and safety.  Will continue to address with hopes to transition from RW to [facility] within the next 1-2 visits.  Continued to progress LE and core strengthening.   PT Treatment/Interventions ADLs/Self Care Home Management;Functional mobility training;Stair training;Gait training;DME Instruction;Therapeutic activities;Therapeutic exercise;Balance training;Neuromuscular re-education;Patient/family education;Orthotic Fit/Training   PT Next Visit Plan Continue gait training with SPC during dual tasking and over compliant surfaces; begin checking STG   Consulted and Agree with Plan of Care Patient;Family member/caregiver   Family Member Consulted daughter      Patient will benefit from skilled therapeutic intervention in order to improve the following deficits and impairments:  Abnormal gait, Decreased balance, Decreased cognition, Decreased mobility, Decreased knowledge of use of DME, Decreased safety  awareness, Decreased strength, Difficulty walking, Postural dysfunction, Impaired tone, Increased fascial restricitons  Visit Diagnosis: Other abnormalities of gait and mobility  Unsteadiness on feet  Muscle weakness (generalized)     Problem List Patient Active Problem List   Diagnosis Date Noted  . Seizure-like activity ([place]) 06/28/2016  . Cerebral infarction due to unspecified mechanism   . Gait difficulty   . Memory loss 05/22/2016  . Confusion   . Acute ischemic stroke ([place]) 05/18/2016  . Morbid obesity ([place]) 09/30/2015  . Uncontrolled type 2 diabetes mellitus with circulatory disorder, without long-term current use of insulin ([place])   . Essential hypertension   . Cerebrovascular accident (CVA) due to thrombosis of cerebral artery ([place])   . CVA (cerebral infarction) 08/07/2015  . Pure hypercholesterolemia 10/04/2009     Laureen Abrahams, PT, DPT 10/20/16 12:04 PM  [facility] [address] [place], [name], [address] Phone: [phone]   Fax:  [phone]  Name: Annette Munoz MRN: [id number] Date of Birth: 18-Aug-1966

## 2016-10-22 ENCOUNTER — Other Ambulatory Visit: Payer: Self-pay | Admitting: Physician Assistant

## 2016-10-23 NOTE — Telephone Encounter (Signed)
SS refill req Metformin Last A1C  06/2016= 7.2 prior 05/2016=11.1 (hospital encounters) Last ov 08/2015 Please advise on refill - ? Note for follow up visit?

## 2016-10-24 ENCOUNTER — Ambulatory Visit: Payer: Commercial Managed Care - HMO | Admitting: Physical Therapy

## 2016-10-24 ENCOUNTER — Ambulatory Visit: Payer: Commercial Managed Care - HMO | Admitting: Occupational Therapy

## 2016-10-24 DIAGNOSIS — R2689 Other abnormalities of gait and mobility: Secondary | ICD-10-CM

## 2016-10-24 DIAGNOSIS — R2681 Unsteadiness on feet: Secondary | ICD-10-CM

## 2016-10-24 DIAGNOSIS — R278 Other lack of coordination: Secondary | ICD-10-CM

## 2016-10-24 DIAGNOSIS — M6281 Muscle weakness (generalized): Secondary | ICD-10-CM

## 2016-10-24 DIAGNOSIS — R41842 Visuospatial deficit: Secondary | ICD-10-CM | POA: Diagnosis not present

## 2016-10-24 DIAGNOSIS — I69315 Cognitive social or emotional deficit following cerebral infarction: Secondary | ICD-10-CM

## 2016-10-24 NOTE — Therapy (Signed)
[facility] [address] [place], [name], [address] Phone: [phone]   Fax:  [phone]  Occupational Therapy Treatment  Patient Details  Name: Annette Munoz MRN: [id number] Date of Birth: [birth year] Referring Provider: Veneda Melter. Candiss Norse  Encounter Date: 10/24/2016      OT End of Session - 10/24/16 1144    Visit Number 19   Number of Visits 30   Date for OT Re-Evaluation 11/26/16   Authorization Type [employer] 60 Visit Limit Combined (OT eval +14 speech treatments earlier this year) as of 10/15/16 insurance reported 37 visits used this year (evals not included)   Authorization - Visit Number 2   Authorization - Number of Visits --   OT Start Time 1109   OT Stop Time 1147   OT Time Calculation (min) 38 min   Activity Tolerance Patient tolerated treatment well   Behavior During Therapy WFL for tasks assessed/performed      Past Medical History:  Diagnosis Date  . Diabetes mellitus without complication ([place])   . Hypertension   . Stroke [facility])     Past Surgical History:  Procedure Laterality Date  . CESAREAN SECTION    . TEE WITHOUT CARDIOVERSION N/A 08/10/2015   Procedure: TRANSESOPHAGEAL ECHOCARDIOGRAM (TEE);  Surgeon: [name], MD;  Location: [facility] ENDOSCOPY;  Service: Cardiovascular;  Laterality: N/A;    There were no vitals filed for this visit.      Subjective Assessment - 10/24/16 1111    Subjective  denies pain   Currently in Pain? No/denies             Completing a simple puzzle for cognitive and visual perceptual skills, min difficulty/ v.c. Pt required v.c to use RUE. Standing to copy small peg design, mod v.c. For correct design, upright posture and RUE use. Arm bike x 5 mins level for conditioning, pt maintained 25-30 rpm with v.c.                   OT Short Term Goals - 10/12/16 1223      OT SHORT TERM GOAL #1   Title Pt will  be Mod I signs and symtpoms of CVA/TIA   Status Deferred     OT SHORT TERM GOAL #2   Title Pt will be Mod I home program RUE  supervision and mod cueing   Status Partially Met  09/27/16 per dtr supervision and min cueing,     OT SHORT TERM GOAL #3   Title Pt/family will I state 2-3 pieces of a/e &/or DME for home use for increased independnece and safety   Status Achieved  09/27/16 verbalizes walker and tub bench     OT SHORT TERM GOAL #4   Title Patient will dress lower body with mod cueing and intermittent mod assistance following set up.   Time 4   Period Weeks   Status Achieved  07/17/16:  min-mod A per dtr report     OT SHORT TERM GOAL #5   Title Patient will dress upper body with set up and minimal assistance   Time 4   Period Weeks   Status Achieved  07/17/16: per dtr report     OT SHORT TERM GOAL #6   Title Pt will perform simple snack/ beverage prep or cold meal prep with supervision/ min v.c.   Baseline currently not performing consistently   Time 4   Period Weeks   Status Achieved  OT SHORT TERM GOAL #7   Title Pt will safely navigate a moderately distracting environment and locate items with 90% or better accuracy.   Baseline 09/27/16-85%  in min distracting environment   Time 4   Period Weeks   Status New     OT SHORT TERM GOAL #8   Title Pt will demonstrate improved fine motor coordination with RUE as evidenced by decreasing 9 hole peg test score to 34 secs or less.   Baseline RUE 38.50 secs, LUE 26.19 secs   Time 4   Period Weeks   Status New     OT SHORT TERM GOAL  #9   TITLE Pt will perform visual perceptual tasks with a cognitive component with 90% or better accuracy.   Baseline basic 1.36M number cancellation with 96% accuracy   Time 4   Period Weeks   Status Achieved  10/12/16 met at approx this level           OT Long Term Goals - 09/27/16 1201      OT LONG TERM GOAL #1   Title Pt will be Min A LB/UB Dressing and ADL's   Baseline  Mod-max A   Time 8   Period Weeks   Status Achieved     OT LONG TERM GOAL #2   Title Pt will be Mod I-supervision updated HEP RUE   Time 8   Period Weeks   Status Not Met  supervision- min v.c. for HEP     OT LONG TERM GOAL #3   Title Pt will demonstrate improved RUE strength as seen by 3+/5 MMT   Baseline -3/5 RUE   Time 8   Period Weeks   Status Achieved     OT LONG TERM GOAL #4   Title Pt will demonstrate improved R hand coordination and grip strength as seen by improved Box & Blocks to 30 or greater and grip 30# or greater R.   Baseline Grip 19#; Box and Blocks = 13 (06/27/16)   Time 8   Period Weeks   Status Achieved  38 blocks, grip RUE 50 lbs     OT LONG TERM GOAL #5   Title Pt will demonstrate improved functional mobility and balance as seen by supervision assist for simulated shower transfers   Baseline Moderate (06/27/16)   Time 8   Period Weeks   Status Achieved  08/17/16  Met per daughter      OT LONG TERM GOAL #6   Title Pt will perform simple snack/ cold meal prep and basic home management at a modified independent level demonstrating good safety awareness.   Time 8   Period Weeks   Status New     OT LONG TERM GOAL #7   Title Pt / family will demonstrate understanding of updated HEP.   Time 8   Period Weeks   Status New     OT LONG TERM GOAL #8   Title Pt will demonstrate improved standing balance and RUE endurance to perfom functional reaching activities mid-high range x 10 mins in standing without rest break or LOB.   Time 8   Period Weeks   Status New               Plan - 10/24/16 1312    Clinical Impression Statement Pt is progressing towards goals for standing balance, visual perceptual skills and cognition.   Rehab Potential Fair   Clinical Impairments Affecting Rehab Potential severity of deficits; cognitive deficits  OT Frequency 2x / week   OT Duration 8 weeks   OT Treatment/Interventions Self-care/ADL training;Therapeutic  exercise;DME and/or AE instruction;Therapeutic activities;Patient/family education;Balance training;Neuromuscular education;Energy conservation;Passive range of motion;Therapeutic exercises;Visual/perceptual remediation/compensation   Plan change making using money to simulate, simple home management/ snack prep   Consulted and Agree with Plan of Care Patient;Family member/caregiver   Family Member Consulted Daughter      Patient will benefit from skilled therapeutic intervention in order to improve the following deficits and impairments:  Decreased balance, Decreased endurance, Impaired vision/preception, Decreased range of motion, Decreased knowledge of precautions, Decreased cognition, Decreased activity tolerance, Decreased coordination, Decreased knowledge of use of DME, Decreased safety awareness, Decreased strength, Impaired flexibility, Impaired UE functional use, Decreased mobility, Difficulty walking, Impaired perceived functional ability, Abnormal gait, Impaired sensation  Visit Diagnosis: Visuospatial deficit  Other abnormalities of gait and mobility  Unsteadiness on feet  Cognitive social or emotional deficit following cerebral infarction  Other lack of coordination  Muscle weakness (generalized)    Problem List Patient Active Problem List   Diagnosis Date Noted  . Seizure-like activity ([place]) 06/28/2016  . Cerebral infarction due to unspecified mechanism   . Gait difficulty   . Memory loss 05/22/2016  . Confusion   . Acute ischemic stroke ([place]) 05/18/2016  . Morbid obesity ([place]) 09/30/2015  . Uncontrolled type 2 diabetes mellitus with circulatory disorder, without long-term current use of insulin ([place])   . Essential hypertension   . Cerebrovascular accident (CVA) due to thrombosis of cerebral artery ([place])   . CVA (cerebral infarction) 08/07/2015  . Pure hypercholesterolemia 10/04/2009    RINE,KATHRYN 10/24/2016, 1:14 PM  [facility] [address] [place], [name], [address] Phone: [phone]   Fax:  [phone]  Name: Annette Munoz MRN: [id number] Date of Birth: August 08, 1966

## 2016-10-25 NOTE — Therapy (Signed)
[facility] [address] [place], [name], [address] Phone: [phone]   Fax:  [phone]  Physical Therapy Treatment  Patient Details  Name: Annette Munoz MRN: [id number] Date of Birth: Oct 23, 1965 Referring Provider: Rosalin Hawking  Encounter Date: 10/24/2016      PT End of Session - 10/25/16 1819    Visit Number 23   Number of Visits 32   Date for PT Re-Evaluation 11/29/16   Authorization Type [employer] 60 visit limit combined- started over with 2018 year   Authorization - Visit Number 3   Authorization - Number of Visits 60   PT Start Time 1150   PT Stop Time 1230   PT Time Calculation (min) 40 min   Equipment Utilized During Treatment Gait belt   Activity Tolerance Patient tolerated treatment well   Behavior During Therapy [facility] for tasks assessed/performed      Past Medical History:  Diagnosis Date  . Diabetes mellitus without complication ([place])   . Hypertension   . Stroke [facility])     Past Surgical History:  Procedure Laterality Date  . CESAREAN SECTION    . TEE WITHOUT CARDIOVERSION N/A 08/10/2015   Procedure: TRANSESOPHAGEAL ECHOCARDIOGRAM (TEE);  Surgeon: [name], MD;  Location: [facility] ENDOSCOPY;  Service: Cardiovascular;  Laterality: N/A;    There were no vitals filed for this visit.      Subjective Assessment - 10/24/16 1153    Subjective No falls, walking with the cane some at home.   Patient is accompained by: Family member   Pertinent History HTN, DM, CVA (x 2), with recent TIA per family report; weakness on R side new since 06/18/16 (Has been on hold for PT since 08/17/16 due to awaiting insurance verifications/clarification on visits remaining)   Currently in Pain? No/denies                         [facility] Adult PT Treatment/Exercise - 10/24/16 1154      Transfers   Transfers Sit to Stand;Stand to Sit   Sit to Stand 5: Supervision;With upper extremity assist;Without upper  extremity assist;From chair/3-in-1   Stand to Sit 5: Supervision;With upper extremity assist;Without upper extremity assist;To chair/3-in-1   Number of Reps 10 reps;2 sets   Comments Cues for foot placement      Ambulation/Gait   Ambulation/Gait Yes   Ambulation/Gait Assistance 4: Min guard   Ambulation/Gait Assistance Details Conversation and environmental scanning tasks performed with gait using SPC; no LOB noted, but pt is noted to slow gait pace with those tasks.  Additional cues provided when patient is fatigued, for improved R foot clearance.   Ambulation Distance (Feet) 440 Feet  then 400 ft with cane   Assistive device Straight cane   Gait Pattern Step-through pattern;Decreased step length - left;Decreased stance time - right   Ambulation Surface Level;Indoor   Pre-Gait Activities Discussed with patient and daughter transition to home gait with continued supervision, using SPC.  For outdoor gait, especially with incline to get into and out of home, pt still needs to use RW.   Gait Comments Gait activities walking from gym to car (under awning), using SPC, with supervision.  Upon standing to start gait, pt tries to immediately start walking and has LOB.  Able to self-recover with cues to stop and get balance prior to initiating gait.  No other LOB noted.     Standardized Balance Assessment   Standardized Balance Assessment Timed  Up and Go Test     Berg Balance Test   Sit to Stand Able to stand without using hands and stabilize independently   Standing Unsupported Able to stand safely 2 minutes   Sitting with Back Unsupported but Feet Supported on Floor or Stool Able to sit safely and securely 2 minutes   Stand to Sit Sits safely with minimal use of hands   Transfers Able to transfer safely, definite need of hands   Standing Unsupported with Eyes Closed Able to stand 10 seconds with supervision   Standing Ubsupported with Feet Together Able to place feet together independently and  stand for 1 minute with supervision   From Standing, Reach Forward with Outstretched Arm Can reach forward >12 cm safely (5")   From Standing Position, Pick up Object from [place] to pick up shoe safely and easily   From Standing Position, Turn to Look Behind Over each Shoulder Looks behind from both sides and weight shifts well   Turn 360 Degrees Able to turn 360 degrees safely but slowly   Standing Unsupported, Alternately Place Feet on Step/Stool Able to complete >2 steps/needs minimal assist   Standing Unsupported, One Foot in Front Able to plae foot ahead of the other independently and hold 30 seconds   Standing on One Leg Tries to lift leg/unable to hold 3 seconds but remains standing independently   Total Score 43     Timed Up and Go Test   Normal TUG (seconds) 25.32  with cane; 23.83 sec with cane                PT Education - 10/25/16 1818    Education provided Yes   Education Details Educated patient and daughter on transition to gait with cane in home and into therapy sessions, all with supervision; outdoor and long distance gait continue with RW   Person(s) Educated Patient;Child(ren)   Methods Explanation;Demonstration   Comprehension Verbalized understanding          PT Short Term Goals - 10/25/16 1819      PT SHORT TERM GOAL #1   Title  UPDATED Goal:  Pt/family verblaize/demo progressive HEP for strength, balance, gait.  TARGET 10/29/16   Baseline Updated STGS per re-eval 09/29/16   Time 4   Period Weeks   Status New     PT SHORT TERM GOAL #2   Title  UPDATED Goal:  Pt will transfer 8 fo 10 reps no UE support, sit<>stand for improved transfer efficiency.  TARGET 10/29/16   Baseline per reeval 09/29/16   Time 4   Period Weeks   Status New     PT SHORT TERM GOAL #3   Title  UPDATED GOAL Improve TUG score to less than or equal to 20 seconds for decreased fall risk.  TARGET 10/29/16   Baseline 24.81 sec with RW and 21.76 sec with cane at reeval 09/29/16    Time 4   Period Weeks   Status New     PT SHORT TERM GOAL #4   Title Pt will negotiate at least 4 steps using 1 handrail, with min asistance, for improved safety with home stair negotiation.   Time 4   Period Weeks   Status Achieved     PT SHORT TERM GOAL #5   Title  UPDATED GOAL:  Pt will improve Berg score to at least 45/56 for decreased fall risk.  TARGET 10/29/16   Baseline 40/56 at reeval 09/29/16; 43/56 on 10/24/16  Time 4   Period Weeks   Status Not Met           PT Long Term Goals - 10/03/16 1210      PT LONG TERM GOAL #1   Title Pt/family will verbalize understanding of fall prevention within the home environment.  TARGET 08/25/16  UPDATED TARGET 11/29/16   Baseline Per re-eval 09/29/16   Time 8   Period Weeks   Status On-going     PT LONG TERM GOAL #2   Title  UPDATED GOAL:  Pt will improved gait velocity to at least 1.8 ft/sec with cane for improved household mobility.  TARGET 2/14/187   Baseline Met and revised per reeval 09/29/16-gait velocity 1.41 ft/sec with RW; 1.53 ft/sec with cane   Time 8   Period Weeks   Status Revised     PT LONG TERM GOAL #3   Title UPDATED GOAL:  Improve TUG score to less than or equal to 15 seconds for decreased fall risk.  TARGET 11/29/16   Baseline REvised as LTG per 09/29/16   Time 8   Period Weeks   Status Revised     PT LONG TERM GOAL #4   Title   UPDATED Goal:  DGI to be assessed and goal to be written as appropriate.  TARGET 11/29/16   Baseline Berg 40/56 at reeval 09/29/16 (new STG added to address BERG)   Time 8   Period Weeks   Status Revised     PT LONG TERM GOAL #5   Title UPDATED GOAL:  ambulate at least 1000 ft indoor/outdoor surfaces with supervision.  TARGET 11/29/16   Baseline 500 ft met with RW per pt's daughter report 09/29/16 re-eval   Time 8   Period Weeks   Status Revised     PT LONG TERM GOAL #6   Title Pt will negotiate at least 4 steps, 1 rail, with supervision of family, for improved safety  with stair negotiation into/out of the home.  UPDATED TARGET 11/29/16   Time 8   Period Weeks   Status On-going               Plan - 10/25/16 1820    Clinical Impression Statement Pt has improved Berg score to 43/56, remains at fall risk with Berg and TUG scores.  In therapy sessions pt is progressing with gait with supervision and PT feels that patient is safe at supervision level with [facility] for home.  Pt continues to need cues for safety with transfers/initiating gait and with longer distance gai.  Plan to continue skilled physical therapy to address balance, gait and transfers for continued independence and safety.   Rehab Potential Fair   Clinical Impairments Affecting Rehab Potential Cognition (pt does have family support of husband and daughter)   PT Frequency 2x / week   PT Duration 8 weeks  per recert 49/70/26   PT Treatment/Interventions ADLs/Self Care Home Management;Functional mobility training;Stair training;Gait training;DME Instruction;Therapeutic activities;Therapeutic exercise;Balance training;Neuromuscular re-education;Patient/family education;Orthotic Fit/Training   PT Next Visit Plan Continue checking STGs.  Work on inclines, declines with SPC and outdoor surfaces    Consulted and Agree with Plan of Care Patient;Family member/caregiver   Family Member Consulted daughter      Patient will benefit from skilled therapeutic intervention in order to improve the following deficits and impairments:  Abnormal gait, Decreased balance, Decreased cognition, Decreased mobility, Decreased knowledge of use of DME, Decreased safety awareness, Decreased strength, Difficulty walking, Postural dysfunction, Impaired tone, Increased fascial  restricitons  Visit Diagnosis: Other abnormalities of gait and mobility     Problem List Patient Active Problem List   Diagnosis Date Noted  . Seizure-like activity ([place]) 06/28/2016  . Cerebral infarction due to unspecified mechanism   . Gait  difficulty   . Memory loss 05/22/2016  . Confusion   . Acute ischemic stroke ([place]) 05/18/2016  . Morbid obesity ([place]) 09/30/2015  . Uncontrolled type 2 diabetes mellitus with circulatory disorder, without long-term current use of insulin ([place])   . Essential hypertension   . Cerebrovascular accident (CVA) due to thrombosis of cerebral artery ([place])   . CVA (cerebral infarction) 08/07/2015  . Pure hypercholesterolemia 10/04/2009    MARRIOTT,AMY W. 10/25/2016, 6:25 PM  Frazier Butt., PT  [facility] [address] [place], [name], [address] Phone: [phone]   Fax:  [phone]  Name: AUBRIELLE STROUD MRN: [id number] Date of Birth: 03-06-1966

## 2016-10-26 ENCOUNTER — Ambulatory Visit: Payer: Commercial Managed Care - HMO | Admitting: Physical Therapy

## 2016-10-26 ENCOUNTER — Ambulatory Visit: Payer: Commercial Managed Care - HMO | Admitting: Occupational Therapy

## 2016-10-26 DIAGNOSIS — R41842 Visuospatial deficit: Secondary | ICD-10-CM

## 2016-10-26 DIAGNOSIS — R278 Other lack of coordination: Secondary | ICD-10-CM

## 2016-10-26 DIAGNOSIS — R2689 Other abnormalities of gait and mobility: Secondary | ICD-10-CM

## 2016-10-26 DIAGNOSIS — R2681 Unsteadiness on feet: Secondary | ICD-10-CM

## 2016-10-26 DIAGNOSIS — M6281 Muscle weakness (generalized): Secondary | ICD-10-CM

## 2016-10-26 DIAGNOSIS — I69315 Cognitive social or emotional deficit following cerebral infarction: Secondary | ICD-10-CM

## 2016-10-26 NOTE — Therapy (Signed)
[facility] [address] [place], [name], [address] Phone: [phone]   Fax:  [phone]  Physical Therapy Treatment  Patient Details  Name: Annette Munoz MRN: [id number] Date of Birth: March 20, 1966 Referring Provider: Rosalin Hawking  Encounter Date: 10/26/2016      PT End of Session - 10/26/16 2023    Visit Number 24   Number of Visits 32   Date for PT Re-Evaluation 11/29/16   Authorization Type [employer] 60 visit limit combined- started over with 2018 year   Authorization - Visit Number 4   Authorization - Number of Visits 60   PT Start Time 1021   PT Stop Time 1100   PT Time Calculation (min) 39 min   Equipment Utilized During Treatment Gait belt   Activity Tolerance Patient tolerated treatment well   Behavior During Therapy [facility] for tasks assessed/performed  Pt very talkative today about upcoming beach trip in July.      Past Medical History:  Diagnosis Date  . Diabetes mellitus without complication ([place])   . Hypertension   . Stroke [facility])     Past Surgical History:  Procedure Laterality Date  . CESAREAN SECTION    . TEE WITHOUT CARDIOVERSION N/A 08/10/2015   Procedure: TRANSESOPHAGEAL ECHOCARDIOGRAM (TEE);  Surgeon: [name], MD;  Location: [facility];  Service: Cardiovascular;  Laterality: N/A;    There were no vitals filed for this visit.      Subjective Assessment - 10/26/16 1023    Subjective No problems, per daughter, with walking with the cane at home.  Pt asks (multiple times during session) about need for scooter and handicapped sticker when she goes to the beach in July.   Patient is accompained by: Family member   Pertinent History HTN, DM, CVA (x 2), with recent TIA per family report; weakness on R side new since 06/18/16 (Has been on hold for PT since 08/17/16 due to awaiting insurance verifications/clarification on visits remaining)   Patient Stated Goals Pt's goals for therapy are to "do  everything I need to do."  Updated 09/29/16:  "To get rid of brace and walker"    Currently in Pain? No/denies                         [facility] Adult PT Treatment/Exercise - 10/26/16 1032      Transfers   Transfers Sit to Stand;Stand to Sit   Sit to Stand 5: Supervision;Without upper extremity assist;From chair/3-in-1  from mat surface   Stand to Sit 5: Supervision;Without upper extremity assist;To chair/3-in-1  mat surface, compliant surface under feet, 10 reps each    Number of Reps 10 reps;Other sets (comment)  10 reps, 3 sets, cues for foot placement   Comments Cues for foot placement to narrow BOS and to improve equal weigthbearing through bilateral lower extremities.  Tactile cues provided to L foot with sit<>stand from 18" surface without UE support.     Ambulation/Gait   Ambulation/Gait Yes   Ambulation/Gait Assistance 4: Min guard   Ambulation/Gait Assistance Details Conversation and environmental scanning tasks with gait.  Occasional cues provided for foot clearance.   Ambulation Distance (Feet) 440 Feet  x 2   Assistive device Straight cane   Gait Pattern Step-through pattern;Decreased step length - left;Decreased stance time - right;Poor foot clearance - right;Wide base of support   Ambulation Surface Level;Indoor   Stairs Yes   Stairs Assistance 5: Supervision   Stair  Management Technique One rail Left;Alternating pattern;Step to pattern;Forwards;With cane   Number of Stairs 4  x 4 reps   Height of Stairs 6   Ramp 4: Min assist   Ramp Details (indicate cue type and reason) Cues for technique for improved foot clearance ascending ramp and improved heelstrike and upright posture when descending ramp.  Cues provided to daughter.   Pre-Gait Activities Reiterated that long distance, community gait needs to be with RW.  Pt walked into therapy today and daughter reports no issues with her supervision.                  PT Short Term Goals - 10/26/16  1026      PT SHORT TERM GOAL #1   Title  UPDATED Goal:  Pt/family verblaize/demo progressive HEP for strength, balance, gait.  TARGET 10/29/16   Baseline (P)  Updated STGS per re-eval 09/29/16.  Met per daughter report and review from previous session.   Time 4   Period Weeks   Status (P)  Achieved     PT SHORT TERM GOAL #2   Title  UPDATED Goal:  Pt will transfer 8 fo 10 reps no UE support, sit<>stand for improved transfer efficiency.  TARGET 10/29/16   Baseline per reeval 09/29/16   Time 4   Period Weeks   Status Achieved     PT SHORT TERM GOAL #3   Title  UPDATED GOAL Improve TUG score to less than or equal to 20 seconds for decreased fall risk.  TARGET 10/29/16   Baseline 24.81 sec with RW and 21.76 sec with cane at reeval 09/29/16   Time 4   Period Weeks   Status Not Met     PT SHORT TERM GOAL #4   Title Pt will negotiate at least 4 steps using 1 handrail, with min asistance, for improved safety with home stair negotiation.   Time 4   Period Weeks   Status Achieved     PT SHORT TERM GOAL #5   Title  UPDATED GOAL:  Pt will improve Berg score to at least 45/56 for decreased fall risk.  TARGET 10/29/16   Baseline 40/56 at reeval 09/29/16; 43/56 on 10/24/16   Time 4   Period Weeks   Status Not Met           PT Long Term Goals - 10/03/16 1210      PT LONG TERM GOAL #1   Title Pt/family will verbalize understanding of fall prevention within the home environment.  TARGET 08/25/16  UPDATED TARGET 11/29/16   Baseline Per re-eval 09/29/16   Time 8   Period Weeks   Status On-going     PT LONG TERM GOAL #2   Title  UPDATED GOAL:  Pt will improved gait velocity to at least 1.8 ft/sec with cane for improved household mobility.  TARGET 2/14/187   Baseline Met and revised per reeval 09/29/16-gait velocity 1.41 ft/sec with RW; 1.53 ft/sec with cane   Time 8   Period Weeks   Status Revised     PT LONG TERM GOAL #3   Title UPDATED GOAL:  Improve TUG score to less than or equal to  15 seconds for decreased fall risk.  TARGET 11/29/16   Baseline REvised as LTG per 09/29/16   Time 8   Period Weeks   Status Revised     PT LONG TERM GOAL #4   Title   UPDATED Goal:  DGI to be assessed and goal to  be written as appropriate.  TARGET 11/29/16   Baseline Berg 40/56 at reeval 09/29/16 (new STG added to address BERG)   Time 8   Period Weeks   Status Revised     PT LONG TERM GOAL #5   Title UPDATED GOAL:  ambulate at least 1000 ft indoor/outdoor surfaces with supervision.  TARGET 11/29/16   Baseline 500 ft met with RW per pt's daughter report 09/29/16 re-eval   Time 8   Period Weeks   Status Revised     PT LONG TERM GOAL #6   Title Pt will negotiate at least 4 steps, 1 rail, with supervision of family, for improved safety with stair negotiation into/out of the home.  UPDATED TARGET 11/29/16   Time 8   Period Weeks   Status On-going               Plan - 10/26/16 2026    Clinical Impression Statement Pt has met STG 1, 2, 4, which were set mid-December.  STG 3 and 5 not met, but pt progressing towards improved mobility measures.  Pt has progressed to cane with supervision at home and short distance gait to car, to<>from car into therapy sessions with no reported difficulties.  Pt will continue to benefit from further skilled PT to address balance and gait, muscle strengthening.   Rehab Potential Fair   Clinical Impairments Affecting Rehab Potential Cognition (pt does have family support of husband and daughter)   PT Frequency 2x / week   PT Duration 8 weeks  per recert 68/34/19   PT Treatment/Interventions ADLs/Self Care Home Management;Functional mobility training;Stair training;Gait training;DME Instruction;Therapeutic activities;Therapeutic exercise;Balance training;Neuromuscular re-education;Patient/family education;Orthotic Fit/Training   PT Next Visit Plan  Work on inclines, declines with SPC and outdoor surfaces.  Core and lower extremity strengthening; gait and  blaance on compliant surfaces.   Consulted and Agree with Plan of Care Patient;Family member/caregiver   Family Member Consulted daughter      Patient will benefit from skilled therapeutic intervention in order to improve the following deficits and impairments:  Abnormal gait, Decreased balance, Decreased cognition, Decreased mobility, Decreased knowledge of use of DME, Decreased safety awareness, Decreased strength, Difficulty walking, Postural dysfunction, Impaired tone, Increased fascial restricitons  Visit Diagnosis: Other abnormalities of gait and mobility  Unsteadiness on feet     Problem List Patient Active Problem List   Diagnosis Date Noted  . Seizure-like activity ([place]) 06/28/2016  . Cerebral infarction due to unspecified mechanism   . Gait difficulty   . Memory loss 05/22/2016  . Confusion   . Acute ischemic stroke ([place]) 05/18/2016  . Morbid obesity ([place]) 09/30/2015  . Uncontrolled type 2 diabetes mellitus with circulatory disorder, without long-term current use of insulin ([place])   . Essential hypertension   . Cerebrovascular accident (CVA) due to thrombosis of cerebral artery ([place])   . CVA (cerebral infarction) 08/07/2015  . Pure hypercholesterolemia 10/04/2009    Kenedy Haisley W. 10/26/2016, 8:31 PM Frazier Butt., PT [facility] [address] [place], [name], [address] Phone: [phone]   Fax:  [phone]  Name: Annette Munoz MRN: [id number] Date of Birth: 06/22/1966

## 2016-10-26 NOTE — Therapy (Signed)
[facility] [address] [place], [name], [address] Phone: [phone]   Fax:  [phone]  Occupational Therapy Treatment  Patient Details  Name: Annette Munoz MRN: [id number] Date of Birth: 01/28/1966 Referring Provider: Veneda Melter. Candiss Norse  Encounter Date: 10/26/2016      OT End of Session - 10/26/16 0902    Visit Number 20   Number of Visits 30   Date for OT Re-Evaluation 11/26/16   Authorization Type [employer] H[employer] 60 Visit Limit Combined    Authorization - Visit Number 3  visists used in 2018   Authorization - Number of Visits 11   OT Start Time [phone]  pt arrived late   OT Stop Time 0933   OT Time Calculation (min) 38 min   Activity Tolerance Patient tolerated treatment well   Behavior During Therapy WFL for tasks assessed/performed      Past Medical History:  Diagnosis Date  . Diabetes mellitus without complication ([place])   . Hypertension   . Stroke [facility])     Past Surgical History:  Procedure Laterality Date  . CESAREAN SECTION    . TEE WITHOUT CARDIOVERSION N/A 08/10/2015   Procedure: TRANSESOPHAGEAL ECHOCARDIOGRAM (TEE);  Surgeon: [name], MD;  Location: [facility] ENDOSCOPY;  Service: Cardiovascular;  Laterality: N/A;    There were no vitals filed for this visit.      Subjective Assessment - 10/26/16 0919    Subjective  Daughter reports that stove is broken so pt hasn't been cooking, but she did look for a few items in the grocery store   Patient is accompained by: Family member   Patient Stated Goals to get better, memory, focusing   Currently in Pain? No/denies       Making change using play money, min-mod v.c. Cues initally, then pt demonstrated improvement with simple change making (under $20 or when no limitations on change), however, pt demo mod cueing for more complex change making (>$20 or when she was limited in how many dimes, $1 bills that were  available to use as she tended to 8 dimes for $0.80 instead of using a variety of coins/quarters, etc).  Discussed how IADLs were going at home.  Pt/dtr reports that she did well with locating a few items in grocery store, but has not performed stovetop cooking as stove recently broke.                         OT Short Term Goals - 10/12/16 1223      OT SHORT TERM GOAL #1   Title Pt will be Mod I signs and symtpoms of CVA/TIA   Status Deferred     OT SHORT TERM GOAL #2   Title Pt will be Mod I home program RUE  supervision and mod cueing   Status Partially Met  09/27/16 per dtr supervision and min cueing,     OT SHORT TERM GOAL #3   Title Pt/family will I state 2-3 pieces of a/e &/or DME for home use for increased independnece and safety   Status Achieved  09/27/16 verbalizes walker and tub bench     OT SHORT TERM GOAL #4   Title Patient will dress lower body with mod cueing and intermittent mod assistance following set up.   Time 4   Period Weeks   Status Achieved  07/17/16:  min-mod A per dtr report     OT SHORT TERM GOAL #  5   Title Patient will dress upper body with set up and minimal assistance   Time 4   Period Weeks   Status Achieved  07/17/16: per dtr report     OT SHORT TERM GOAL #6   Title Pt will perform simple snack/ beverage prep or cold meal prep with supervision/ min v.c.   Baseline currently not performing consistently   Time 4   Period Weeks   Status Achieved     OT SHORT TERM GOAL #7   Title Pt will safely navigate a moderately distracting environment and locate items with 90% or better accuracy.   Baseline 09/27/16-85%  in min distracting environment   Time 4   Period Weeks   Status New     OT SHORT TERM GOAL #8   Title Pt will demonstrate improved fine motor coordination with RUE as evidenced by decreasing 9 hole peg test score to 34 secs or less.   Baseline RUE 38.50 secs, LUE 26.19 secs   Time 4   Period Weeks   Status New      OT SHORT TERM GOAL  #9   TITLE Pt will perform visual perceptual tasks with a cognitive component with 90% or better accuracy.   Baseline basic 1.71M number cancellation with 96% accuracy   Time 4   Period Weeks   Status Achieved  10/12/16 met at approx this level           OT Long Term Goals - 09/27/16 1201      OT LONG TERM GOAL #1   Title Pt will be Min A LB/UB Dressing and ADL's   Baseline Mod-max A   Time 8   Period Weeks   Status Achieved     OT LONG TERM GOAL #2   Title Pt will be Mod I-supervision updated HEP RUE   Time 8   Period Weeks   Status Not Met  supervision- min v.c. for HEP     OT LONG TERM GOAL #3   Title Pt will demonstrate improved RUE strength as seen by 3+/5 MMT   Baseline -3/5 RUE   Time 8   Period Weeks   Status Achieved     OT LONG TERM GOAL #4   Title Pt will demonstrate improved R hand coordination and grip strength as seen by improved Box & Blocks to 30 or greater and grip 30# or greater R.   Baseline Grip 19#; Box and Blocks = 13 (06/27/16)   Time 8   Period Weeks   Status Achieved  38 blocks, grip RUE 50 lbs     OT LONG TERM GOAL #5   Title Pt will demonstrate improved functional mobility and balance as seen by supervision assist for simulated shower transfers   Baseline Moderate (06/27/16)   Time 8   Period Weeks   Status Achieved  08/17/16  Met per daughter      OT LONG TERM GOAL #6   Title Pt will perform simple snack/ cold meal prep and basic home management at a modified independent level demonstrating good safety awareness.   Time 8   Period Weeks   Status New     OT LONG TERM GOAL #7   Title Pt / family will demonstrate understanding of updated HEP.   Time 8   Period Weeks   Status New     OT LONG TERM GOAL #8   Title Pt will demonstrate improved standing balance and RUE endurance to perfom functional  reaching activities mid-high range x 10 mins in standing without rest break or LOB.   Time 8   Period Weeks    Status New               Plan - 10/26/16 0945    Clinical Impression Statement Pt is slowly progressing towards goals for cognition and incr participation in IADL.   Rehab Potential Fair   Clinical Impairments Affecting Rehab Potential severity of deficits; cognitive deficits   OT Frequency 2x / week   OT Duration 8 weeks   OT Treatment/Interventions Self-care/ADL training;Therapeutic exercise;DME and/or AE instruction;Therapeutic activities;Patient/family education;Balance training;Neuromuscular education;Energy conservation;Passive range of motion;Therapeutic exercises;Visual/perceptual remediation/compensation   Plan check STGs   OT Home Exercise Plan Education Issued:  cane and simple coordination HEP; recommendations for cutting with scissors, braiding, stringing beads   Consulted and Agree with Plan of Care Patient;Family member/caregiver   Family Member Consulted Daughter      Patient will benefit from skilled therapeutic intervention in order to improve the following deficits and impairments:  Decreased balance, Decreased endurance, Impaired vision/preception, Decreased range of motion, Decreased knowledge of precautions, Decreased cognition, Decreased activity tolerance, Decreased coordination, Decreased knowledge of use of DME, Decreased safety awareness, Decreased strength, Impaired flexibility, Impaired UE functional use, Decreased mobility, Difficulty walking, Impaired perceived functional ability, Abnormal gait, Impaired sensation  Visit Diagnosis: Cognitive social or emotional deficit following cerebral infarction  Other lack of coordination  Visuospatial deficit  Unsteadiness on feet  Muscle weakness (generalized)    Problem List Patient Active Problem List   Diagnosis Date Noted  . Seizure-like activity ([place]) 06/28/2016  . Cerebral infarction due to unspecified mechanism   . Gait difficulty   . Memory loss 05/22/2016  . Confusion   . Acute ischemic  stroke ([place]) 05/18/2016  . Morbid obesity ([place]) 09/30/2015  . Uncontrolled type 2 diabetes mellitus with circulatory disorder, without long-term current use of insulin ([place])   . Essential hypertension   . Cerebrovascular accident (CVA) due to thrombosis of cerebral artery ([place])   . CVA (cerebral infarction) 08/07/2015  . Pure hypercholesterolemia 10/04/2009    [facility] 10/26/2016, 8:07 PM  [facility] [address] [place], [name], [address] Phone: [phone]   Fax:  [phone]  Name: Annette Munoz MRN: [id number] Date of Birth: May 21, 1966   Vianne Bulls, OTR/L [facility] [address] [place], [place]  [address] [phone] phone [phone] 10/26/16 8:07 PM

## 2016-10-26 NOTE — Telephone Encounter (Signed)
Please call patient and get her to schedule and appt with me this month

## 2016-10-27 ENCOUNTER — Ambulatory Visit: Payer: Commercial Managed Care - HMO

## 2016-10-27 DIAGNOSIS — R41841 Cognitive communication deficit: Secondary | ICD-10-CM

## 2016-10-27 DIAGNOSIS — R41842 Visuospatial deficit: Secondary | ICD-10-CM | POA: Diagnosis not present

## 2016-10-30 NOTE — Therapy (Signed)
[facility] [address] [place], [place], [address] Phone: [phone]   Fax:  [phone]  Speech Language Pathology Treatment  Patient Details  Name: Annette Munoz MRN: [id number] Date of Birth: Jan 06, 1966 Referring Provider: Dr. Renaye Rakers  Encounter Date: 10/27/2016    Past Medical History:  Diagnosis Date  . Diabetes mellitus without complication (H[facility])   . Hypertension   . Stroke [facility])     Past Surgical History:  Procedure Laterality Date  . CESAREAN SECTION    . TEE WITHOUT CARDIOVERSION N/A 08/10/2015   Procedure: TRANSESOPHAGEAL ECHOCARDIOGRAM (TEE);  Surgeon: Chrystie Nose, MD;  Location: [facility] ENDOSCOPY;  Service: Cardiovascular;  Laterality: N/A;    There were no vitals filed for this visit.             ADULT SLP TREATMENT - 10/30/16 0001      General Information   Behavior/Cognition Cooperative;Pleasant mood;Requires cueing;Distractible;Decreased sustained attention     Treatment Provided   Treatment provided Cognitive-Linquistic     Cognitive-Linquistic Treatment   Treatment focused on Cognition   Skilled Treatment Pt perseverating on whether or not she can use motorized scooter on the beach. SLP targeted baseic attention, organization and awareness skills today. Cues were necessary from SLP for all areas: usual questioning cues for awareness of health changes (diet, safety), initial mod cues fading to min cues for temporal orientation/organization skills looking at calendar of appointments. SLP encoraged pt's family/pt to ask OT and PT if there may be simple task/tasks pt could do safely with assistance at the salon a couple times a week.      Assessment / Recommendations / Plan   Plan Continue with current plan of care     Progression Toward Goals   Progression toward goals Progressing toward goals            SLP Short Term Goals - 10/27/16 1650      SLP SHORT TERM GOAL #1    Title Pt will utilize external aids/memory book to be oriented to date, appointments and daily chores with occasional min A over 3 sessions   Baseline all STGs updated/renewed 09-27-16   Time 1   Period Weeks   Status On-going     SLP SHORT TERM GOAL #2   Title Pt/family will report use of external aids/memory book at home for ADL's and medication mangement over 3 sessions   Baseline one session 07-21-16, 07-28-16   Time 1   Period Weeks   Status On-going     SLP SHORT TERM GOAL #3   Title Pt will verbalize 3 cognitive impairments with mod A   Time 1   Period Weeks   Status On-going     SLP SHORT TERM GOAL #4   Title Pt will attend to simple cognitive linguistic tasks in distracting enviroment for 10 mintues with occasional min A   Time 1   Period Weeks   Status On-going          SLP Long Term Goals - 10/27/16 1651      SLP LONG TERM GOAL #1   Title Pt/family will report use of external memory aids at home with rare min cues, for successful f/u on daily schedule, safety precautions, chores, meds, etc over 3 sessions.   Baseline 08/15/16, 11/2   Time 5  All LTGs renewed 09-27-16   Period Weeks   Status On-going     SLP LONG TERM GOAL #2   Title Pt  will verbalize 3 safety precautions due to cognitive impairments with mod A.    Time 5   Period Weeks   Status On-going     SLP LONG TERM GOAL #3   Title Pt will solve simple reasoning, organization, time and money problems with 75% accuracy and occasional min A   Time 5   Period Weeks   Status On-going     SLP LONG TERM GOAL #4   Title Pt will demonstrate emergent awareness by using memory compensation during therapy session x2 sessions   Baseline 08/15/16   Time 5   Period Weeks   Status On-going          Plan - 10/30/16 1117    Clinical Impression Statement Pt continues to show deficits in the same cognitive linguistic areas as last 3-4 visits. Recommend continue skilled ST to maximize cognition to reduce  caregiver burden and improve independence participating in family life. SLP told pt/daughter requirements of d/c (i.e., if pt progress plateaus). Explained re-eval would be possible to update home tasks, or if notable improvement in cognitive linguistic skills were noted.   Speech Therapy Frequency 2x / week   Duration --  8 weeks/ 30 visits   Treatment/Interventions Compensatory strategies;Patient/family education;Functional tasks;Cueing hierarchy;Internal/external aids;Cognitive reorganization;SLP instruction and feedback   Potential to Achieve Goals Fair   Potential Considerations Severity of impairments;Previous level of function   Consulted and Agree with Plan of Care Patient;Family member/caregiver   Family Member Consulted daughter      Patient will benefit from skilled therapeutic intervention in order to improve the following deficits and impairments:   Cognitive communication deficit    Problem List Patient Active Problem List   Diagnosis Date Noted  . Seizure-like activity (HCC) 06/28/2016  . Cerebral infarction due to unspecified mechanism   . Gait difficulty   . Memory loss 05/22/2016  . Confusion   . Acute ischemic stroke (HCC) 05/18/2016  . Morbid obesity (HCC) 09/30/2015  . Uncontrolled type 2 diabetes mellitus with circulatory disorder, without long-term current use of insulin (HCC)   . Essential hypertension   . Cerebrovascular accident (CVA) due to thrombosis of cerebral artery (HCC)   . CVA (cerebral infarction) 08/07/2015  . Pure hypercholesterolemia 10/04/2009    [facility] EyeCHINKE,[place] Perra ,MS, CCC-SLP  10/30/2016, 11:23 AM  [facility] [address] [place], [name], [address] Phone: [phone]   Fax:  [phone]   Name: Salena SanerBetty H Zinda MRN: [id number] Date of Birth: 12/22/1965

## 2016-10-31 ENCOUNTER — Ambulatory Visit: Payer: Commercial Managed Care - HMO | Admitting: Occupational Therapy

## 2016-10-31 ENCOUNTER — Ambulatory Visit: Payer: Commercial Managed Care - HMO | Admitting: Physical Therapy

## 2016-10-31 ENCOUNTER — Ambulatory Visit: Payer: Commercial Managed Care - HMO | Admitting: *Deleted

## 2016-10-31 DIAGNOSIS — R2689 Other abnormalities of gait and mobility: Secondary | ICD-10-CM

## 2016-10-31 DIAGNOSIS — M6281 Muscle weakness (generalized): Secondary | ICD-10-CM

## 2016-10-31 DIAGNOSIS — R41842 Visuospatial deficit: Secondary | ICD-10-CM

## 2016-10-31 DIAGNOSIS — R41841 Cognitive communication deficit: Secondary | ICD-10-CM

## 2016-10-31 DIAGNOSIS — I69315 Cognitive social or emotional deficit following cerebral infarction: Secondary | ICD-10-CM

## 2016-10-31 DIAGNOSIS — R278 Other lack of coordination: Secondary | ICD-10-CM

## 2016-10-31 DIAGNOSIS — R2681 Unsteadiness on feet: Secondary | ICD-10-CM

## 2016-10-31 NOTE — Therapy (Signed)
[facility] [address] [place], [name], [address] Phone: [phone]   Fax:  [phone]  Physical Therapy Treatment  Patient Details  Name: Annette Munoz MRN: [id number] Date of Birth: Nov 04, 1965 Referring Provider: Rosalin Hawking  Encounter Date: 10/31/2016      PT End of Session - 10/31/16 1311    Visit Number 25   Number of Visits 32   Date for PT Re-Evaluation 11/29/16   Authorization Type [employer] 60 visit limit combined- started over with 2018 year   Authorization - Visit Number 5   Authorization - Number of Visits 60   PT Start Time 4193   PT Stop Time 1229   PT Time Calculation (min) 41 min   Equipment Utilized During Treatment Gait belt   Activity Tolerance Patient tolerated treatment well   Behavior During Therapy WFL for tasks assessed/performed      Past Medical History:  Diagnosis Date  . Diabetes mellitus without complication ([place])   . Hypertension   . Stroke [facility])     Past Surgical History:  Procedure Laterality Date  . CESAREAN SECTION    . TEE WITHOUT CARDIOVERSION N/A 08/10/2015   Procedure: TRANSESOPHAGEAL ECHOCARDIOGRAM (TEE);  Surgeon: [name], MD;  Location: [facility] ENDOSCOPY;  Service: Cardiovascular;  Laterality: N/A;    There were no vitals filed for this visit.      Subjective Assessment - 10/31/16 1150    Subjective doing well, no falls.    Pertinent History HTN, DM, CVA (x 2), with recent TIA per family report; weakness on R side new since 06/18/16 (Has been on hold for PT since 08/17/16 due to awaiting insurance verifications/clarification on visits remaining)   Patient Stated Goals Pt's goals for therapy are to "do everything I need to do."  Updated 09/29/16:  "To get rid of brace and walker"    Currently in Pain? No/denies                         [facility] Adult PT Treatment/Exercise - 10/31/16 1215      Ambulation/Gait   Ambulation/Gait Yes   Ambulation/Gait Assistance 5: Supervision   Ambulation/Gait Assistance Details incorporated amb on compliant surface as well as cognitive tasks with min cues for attention to tasks   Ambulation Distance (Feet) 675 Feet   Assistive device Straight cane   Gait Pattern Step-to pattern;Decreased stance time - right;Decreased step length - left   Ambulation Surface Level;Unlevel;Indoor   Ramp 4: Min assist   Ramp Details (indicate cue type and reason) cues for technique with SPC   Curb 4: Min assist   Curb Details (indicate cue type and reason) cues for sequencing; incorrect technique to descend with initial step down     Knee/Hip Exercises: Supine   Bridges Limitations with LE on peanut ball performing bridges with hamstring curl x 10 reps with cues to maintain hips elevated during curl   Straight Leg Raises Right;10 reps   Straight Leg Raises Limitations 3#     Knee/Hip Exercises: Sidelying   Hip ABduction Right;10 reps   Hip ABduction Limitations no weight; cues for correct technique     Knee/Hip Exercises: Prone   Straight Leg Raises Right;10 reps                  PT Short Term Goals - 10/26/16 1026      PT SHORT TERM GOAL #1   Title  UPDATED Goal:  Pt/family verblaize/demo progressive HEP for strength, balance, gait.  TARGET 10/29/16   Baseline (P)  Updated STGS per re-eval 09/29/16.  Met per daughter report and review from previous session.   Time 4   Period Weeks   Status (P)  Achieved     PT SHORT TERM GOAL #2   Title  UPDATED Goal:  Pt will transfer 8 fo 10 reps no UE support, sit<>stand for improved transfer efficiency.  TARGET 10/29/16   Baseline per reeval 09/29/16   Time 4   Period Weeks   Status Achieved     PT SHORT TERM GOAL #3   Title  UPDATED GOAL Improve TUG score to less than or equal to 20 seconds for decreased fall risk.  TARGET 10/29/16   Baseline 24.81 sec with RW and 21.76 sec with cane at reeval 09/29/16   Time 4   Period Weeks   Status Not  Met     PT SHORT TERM GOAL #4   Title Pt will negotiate at least 4 steps using 1 handrail, with min asistance, for improved safety with home stair negotiation.   Time 4   Period Weeks   Status Achieved     PT SHORT TERM GOAL #5   Title  UPDATED GOAL:  Pt will improve Berg score to at least 45/56 for decreased fall risk.  TARGET 10/29/16   Baseline 40/56 at reeval 09/29/16; 43/56 on 10/24/16   Time 4   Period Weeks   Status Not Met           PT Long Term Goals - 10/03/16 1210      PT LONG TERM GOAL #1   Title Pt/family will verbalize understanding of fall prevention within the home environment.  TARGET 08/25/16  UPDATED TARGET 11/29/16   Baseline Per re-eval 09/29/16   Time 8   Period Weeks   Status On-going     PT LONG TERM GOAL #2   Title  UPDATED GOAL:  Pt will improved gait velocity to at least 1.8 ft/sec with cane for improved household mobility.  TARGET 2/14/187   Baseline Met and revised per reeval 09/29/16-gait velocity 1.41 ft/sec with RW; 1.53 ft/sec with cane   Time 8   Period Weeks   Status Revised     PT LONG TERM GOAL #3   Title UPDATED GOAL:  Improve TUG score to less than or equal to 15 seconds for decreased fall risk.  TARGET 11/29/16   Baseline REvised as LTG per 09/29/16   Time 8   Period Weeks   Status Revised     PT LONG TERM GOAL #4   Title   UPDATED Goal:  DGI to be assessed and goal to be written as appropriate.  TARGET 11/29/16   Baseline Berg 40/56 at reeval 09/29/16 (new STG added to address BERG)   Time 8   Period Weeks   Status Revised     PT LONG TERM GOAL #5   Title UPDATED GOAL:  ambulate at least 1000 ft indoor/outdoor surfaces with supervision.  TARGET 11/29/16   Baseline 500 ft met with RW per pt's daughter report 09/29/16 re-eval   Time 8   Period Weeks   Status Revised     PT LONG TERM GOAL #6   Title Pt will negotiate at least 4 steps, 1 rail, with supervision of family, for improved safety with stair negotiation into/out of the  home.  UPDATED TARGET 11/29/16   Time 8   Period Weeks  Status On-going               Plan - 10/31/16 1311    Clinical Impression Statement Pt with difficulty with multi tasking and cognitive tasks with gait needing mod cues for attention to activity.  Daughter interested in having pt return to work for ~1 hour at a time to do light housekeeping activities (sweeping, washing towels, etc).  Recommended to hold at this time but will practice next session to address if pt would safely be able to perform these tasks.   PT Treatment/Interventions ADLs/Self Care Home Management;Functional mobility training;Stair training;Gait training;DME Instruction;Therapeutic activities;Therapeutic exercise;Balance training;Neuromuscular re-education;Patient/family education;Orthotic Fit/Training   PT Next Visit Plan cont gait/strengthening; trial work related activities to determine if pt safe to go back   Consulted and Agree with Plan of Care Patient;Family member/caregiver   Family Member Consulted daughter      Patient will benefit from skilled therapeutic intervention in order to improve the following deficits and impairments:  Abnormal gait, Decreased balance, Decreased cognition, Decreased mobility, Decreased knowledge of use of DME, Decreased safety awareness, Decreased strength, Difficulty walking, Postural dysfunction, Impaired tone, Increased fascial restricitons  Visit Diagnosis: Other abnormalities of gait and mobility  Unsteadiness on feet  Muscle weakness (generalized)     Problem List Patient Active Problem List   Diagnosis Date Noted  . Seizure-like activity ([place]) 06/28/2016  . Cerebral infarction due to unspecified mechanism   . Gait difficulty   . Memory loss 05/22/2016  . Confusion   . Acute ischemic stroke ([place]) 05/18/2016  . Morbid obesity ([place]) 09/30/2015  . Uncontrolled type 2 diabetes mellitus with circulatory disorder, without long-term current use of insulin ([place])    . Essential hypertension   . Cerebrovascular accident (CVA) due to thrombosis of cerebral artery ([place])   . CVA (cerebral infarction) 08/07/2015  . Pure hypercholesterolemia 10/04/2009       Laureen Abrahams, PT, DPT 10/31/16 1:14 PM    [facility] [address] [place], [name], [address] Phone: [phone]   Fax:  [phone]  Name: Annette Munoz MRN: [id number] Date of Birth: December 09, 1965

## 2016-10-31 NOTE — Telephone Encounter (Signed)
Pt advised and will call back for appt. (she was driving)

## 2016-10-31 NOTE — Therapy (Signed)
[facility] [address] [place], [name], [address] Phone: [phone]   Fax:  [phone]  Occupational Therapy Treatment  Patient Details  Name: Annette Munoz MRN: [id number] Date of Birth: [birth year] Referring Provider: Veneda Melter. Candiss Norse  Encounter Date: 10/31/2016      OT End of Session - 10/31/16 1522    Visit Number 21   Number of Visits 30   Date for OT Re-Evaluation 11/26/16   Authorization Type [employer] H[employer] 60 Visit Limit Combined    Authorization - Visit Number 4  visists used in 2018   Authorization - Number of Visits 11   OT Start Time 0786  pt arrived late   OT Stop Time 1104   OT Time Calculation (min) 36 min   Activity Tolerance Patient tolerated treatment well   Behavior During Therapy WFL for tasks assessed/performed      Past Medical History:  Diagnosis Date  . Diabetes mellitus without complication ([place])   . Hypertension   . Stroke [facility])     Past Surgical History:  Procedure Laterality Date  . CESAREAN SECTION    . TEE WITHOUT CARDIOVERSION N/A 08/10/2015   Procedure: TRANSESOPHAGEAL ECHOCARDIOGRAM (TEE);  Surgeon: [name], MD;  Location: [facility] ENDOSCOPY;  Service: Cardiovascular;  Laterality: N/A;    There were no vitals filed for this visit.      Subjective Assessment - 10/31/16 1043    Subjective  Daughter reports that pt has been warming things in microwave    Patient is accompained by: Family member   Patient Stated Goals to get better, memory, focusing   Currently in Pain? No/denies        Simple, familiar cooking task (making scrambled eggs).  Pt gathered needed items and cooked safely with no LOB.  Pt remembered to turn off stove.  However, pt did not gather items in advance (decr planning) and therefore needed to stop frequently.  Pt also put mild in eggs 2x due to forgetting that she already did this (had to get pan after adding milk  the first time).  Discussed difficulties.  Recommended pt gather all needed items at home first; only cook with supervision; try some oven meals (assembling as stove is broken) and may put in wearing oven mitt with close supervision, but to have someone else take it out of the oven; minimize distractions; write down instructions prior or use a recipe as a checklist.  Pt/dtr verbalized understanding.  Dtr. Reports that speech therapy recommended doing simple things at the shop with supervision.  Discussed appropriate activities (washing combs, sweeping, laundry, and ? Washing hair).  However, recommended supervision and trying these activities at home first.                          OT Short Term Goals - 10/31/16 1524      OT SHORT TERM GOAL #1   Title Pt will be Mod I signs and symtpoms of CVA/TIA   Status Deferred     OT SHORT TERM GOAL #2   Title Pt will be Mod I home program RUE  supervision and mod cueing   Status Partially Met  09/27/16 per dtr supervision and min cueing,     OT SHORT TERM GOAL #3   Title Pt/family will I state 2-3 pieces of a/e &/or DME for home use for increased independnece and safety   Status Achieved  09/27/16 verbalizes  walker and tub bench     OT SHORT TERM GOAL #4   Title Patient will dress lower body with mod cueing and intermittent mod assistance following set up.   Time 4   Period Weeks   Status Achieved  07/17/16:  min-mod A per dtr report     OT SHORT TERM GOAL #5   Title Patient will dress upper body with set up and minimal assistance   Time 4   Period Weeks   Status Achieved  07/17/16: per dtr report     OT SHORT TERM GOAL #6   Title Pt will perform simple snack/ beverage prep or cold meal prep with supervision/ min v.c.   Baseline currently not performing consistently   Time 4   Period Weeks   Status Achieved     OT SHORT TERM GOAL #7   Title Pt will safely navigate a moderately distracting environment and locate  items with 90% or better accuracy.   Baseline 09/27/16-85%  in min distracting environment   Time 4   Period Weeks   Status New     OT SHORT TERM GOAL #8   Title Pt will demonstrate improved fine motor coordination with RUE as evidenced by decreasing 9 hole peg test score to 34 secs or less.   Baseline RUE 38.50 secs, LUE 26.19 secs   Time 4   Period Weeks   Status New     OT SHORT TERM GOAL  #9   TITLE Pt will perform visual perceptual tasks with a cognitive component with 90% or better accuracy.   Baseline basic 1.14M number cancellation with 96% accuracy   Time 4   Period Weeks   Status Achieved  10/12/16 met at approx this level           OT Long Term Goals - 10/31/16 1525      OT LONG TERM GOAL #1   Title Pt will be Min A LB/UB Dressing and ADL's   Baseline Mod-max A   Time 8   Period Weeks   Status Achieved     OT LONG TERM GOAL #2   Title Pt will be Mod I-supervision updated HEP RUE   Time 8   Period Weeks   Status Not Met  supervision- min v.c. for HEP     OT LONG TERM GOAL #3   Title Pt will demonstrate improved RUE strength as seen by 3+/5 MMT   Baseline -3/5 RUE   Time 8   Period Weeks   Status Achieved     OT LONG TERM GOAL #4   Title Pt will demonstrate improved R hand coordination and grip strength as seen by improved Box & Blocks to 30 or greater and grip 30# or greater R.   Baseline Grip 19#; Box and Blocks = 13 (06/27/16)   Time 8   Period Weeks   Status Achieved  38 blocks, grip RUE 50 lbs     OT LONG TERM GOAL #5   Title Pt will demonstrate improved functional mobility and balance as seen by supervision assist for simulated shower transfers   Baseline Moderate (06/27/16)   Time 8   Period Weeks   Status Achieved  08/17/16  Met per daughter      OT LONG TERM GOAL #6   Title Pt will perform simple snack/ cold meal prep and basic home management at a modified independent level demonstrating good safety awareness.   Time 8   Period Weeks  Status New     OT LONG TERM GOAL #7   Title Pt / family will demonstrate understanding of updated HEP.   Time 8   Period Weeks   Status New     OT LONG TERM GOAL #8   Title Pt will demonstrate improved standing balance and RUE endurance to perfom functional reaching activities mid-high range x 10 mins in standing without rest break or LOB.   Time 8   Period Weeks   Status New               Plan - 10/31/16 1523    Clinical Impression Statement Pt progressing towards goals, but demo decr initiation for activities at home.  Memory and attention deficits remain a barrier.   Rehab Potential Fair   Clinical Impairments Affecting Rehab Potential severity of deficits; cognitive deficits   OT Frequency 2x / week   OT Duration 8 weeks   OT Treatment/Interventions Self-care/ADL training;Therapeutic exercise;DME and/or AE instruction;Therapeutic activities;Patient/family education;Balance training;Neuromuscular education;Energy conservation;Passive range of motion;Therapeutic exercises;Visual/perceptual remediation/compensation   Plan check remaining STGs; environmental scanning; sweeping/home maintenance task    OT Home Exercise Plan Education Issued:  cane and simple coordination HEP; recommendations for cutting with scissors, braiding, stringing beads   Consulted and Agree with Plan of Care Patient;Family member/caregiver   Family Member Consulted Daughter      Patient will benefit from skilled therapeutic intervention in order to improve the following deficits and impairments:  Decreased balance, Decreased endurance, Impaired vision/preception, Decreased range of motion, Decreased knowledge of precautions, Decreased cognition, Decreased activity tolerance, Decreased coordination, Decreased knowledge of use of DME, Decreased safety awareness, Decreased strength, Impaired flexibility, Impaired UE functional use, Decreased mobility, Difficulty walking, Impaired perceived functional  ability, Abnormal gait, Impaired sensation  Visit Diagnosis: Cognitive social or emotional deficit following cerebral infarction  Unsteadiness on feet  Other abnormalities of gait and mobility  Other lack of coordination  Visuospatial deficit  Muscle weakness (generalized)    Problem List Patient Active Problem List   Diagnosis Date Noted  . Seizure-like activity ([place]) 06/28/2016  . Cerebral infarction due to unspecified mechanism   . Gait difficulty   . Memory loss 05/22/2016  . Confusion   . Acute ischemic stroke ([place]) 05/18/2016  . Morbid obesity ([place]) 09/30/2015  . Uncontrolled type 2 diabetes mellitus with circulatory disorder, without long-term current use of insulin ([place])   . Essential hypertension   . Cerebrovascular accident (CVA) due to thrombosis of cerebral artery ([place])   . CVA (cerebral infarction) 08/07/2015  . Pure hypercholesterolemia 10/04/2009    [facility] 10/31/2016, 3:36 PM  [facility] [address] [place], [name], [address] Phone: [phone]   Fax:  [phone]  Name: Annette Munoz MRN: [id number] Date of Birth: [birth year]   Vianne Bulls, OTR/L [facility] [address] [place], [place]  [address] [phone] phone [phone] 10/31/16 3:36 PM

## 2016-10-31 NOTE — Patient Instructions (Signed)
   LIST OF ACTIVITIES YOU CAN DO  Folding towels and clothes  Empty silverware  Dry pots and pans  Dusting and vacuuming  Wash bathroom sink and toilet  File/sort mail and paperwork  Cooking with supervision  Light work at the shop 3 days a week for about an hour  (sweeping, washing towels and combs, folding towels, washing hair)  No driving yet (you don't have a handicap sticker)

## 2016-10-31 NOTE — Therapy (Signed)
[facility] [address] [place], [name], [address] Phone: [phone]   Fax:  [phone]  Speech Language Pathology Treatment  Patient Details  Name: Annette Munoz MRN: [id number] Date of Birth: [birth year] Referring Provider: Dr. Lucianne Lei  Encounter Date: 10/31/2016      End of Session - 10/31/16 1255    Visit Number 20   Number of Visits 30   Date for SLP Re-Evaluation 12/15/16   SLP Start Time 1108   SLP Stop Time  1145   SLP Time Calculation (min) 37 min   Activity Tolerance Patient tolerated treatment well      Past Medical History:  Diagnosis Date  . Diabetes mellitus without complication ([place])   . Hypertension   . Stroke [facility])     Past Surgical History:  Procedure Laterality Date  . CESAREAN SECTION    . TEE WITHOUT CARDIOVERSION N/A 08/10/2015   Procedure: TRANSESOPHAGEAL ECHOCARDIOGRAM (TEE);  Surgeon: [name], MD;  Location: [facility] ENDOSCOPY;  Service: Cardiovascular;  Laterality: N/A;    There were no vitals filed for this visit.      Subjective Assessment - 10/31/16 1110    Subjective I'm working on my memory   Patient is accompained by: Family member  daughter Annette Munoz   Currently in Pain? No/denies               ADULT SLP TREATMENT - 10/31/16 0001      General Information   Behavior/Cognition Alert;Cooperative;Pleasant mood     Treatment Provided   Treatment provided Cognitive-Linquistic     Pain Assessment   Pain Assessment No/denies pain     Cognitive-Linquistic Treatment   Treatment focused on Cognition;Patient/family/caregiver education   Skilled Treatment Pt seen for skilled ST session targeting cognitive goals. Pt indicates relying on her children to remind her to use her memory notebook. SLP, pt and daughter reviewed her notebook, adding information as necessary, and writing a table of contents to facilitate pt effective and independent use of the  notebook. Daughter indicated she would place sticky notes in obvious locations at home to facilitate pt independence with morning routine. Pt was encouraged to refer to her book frequently throughout the day, using the table of contents to locate reminders and lists. Pt continued to perseverate on a handicapped sticker, which she does not have.      Assessment / Recommendations / Plan   Plan Continue with current plan of care     Progression Toward Goals   Progression toward goals Progressing toward goals          SLP Education - 10/31/16 1255    Education provided Yes   Education Details updated activity list for notebook   Person(s) Educated Patient;Child(ren)   Methods Explanation;Demonstration;Verbal cues;Handout   Comprehension Verbalized understanding;Need further instruction;Verbal cues required          SLP Short Term Goals - 10/31/16 1258      SLP SHORT TERM GOAL #1   Title Pt will utilize external aids/memory book to be oriented to date, appointments and daily chores with occasional min A over 3 sessions   Time 0   Status Not Met  - Mod-max cues required to use memory book and find information in it     SLP SHORT TERM GOAL #2   Title Pt/family will report use of external aids/memory book at home for ADL's and medication mangement over 3 sessions   Status Not Met - family initiates use  of the notebook     SLP SHORT TERM GOAL #3   Title Pt will verbalize 3 cognitive impairments with mod A   Status Partially met - pt requires mod+ cues to look up information in notebook regarding focus of ST          SLP Long Term Goals - 10/31/16 1301      SLP LONG TERM GOAL #1   Title Pt/family will report use of external memory aids at home with rare min cues, for successful f/u on daily schedule, safety precautions, chores, meds, etc over 3 sessions.   Time 4   Period Weeks   Status On-going     SLP LONG TERM GOAL #2   Title Pt will verbalize 3 safety precautions due to  cognitive impairments with mod A.    Time 4   Period Weeks   Status On-going     SLP LONG TERM GOAL #3   Title Pt will solve simple reasoning, organization, time and money problems with 75% accuracy and occasional min A   Time 4   Period Weeks   Status On-going     SLP LONG TERM GOAL #4   Title Pt will demonstrate emergent awareness by using memory compensation during therapy session x2 sessions   Time 4   Period Weeks   Status On-going          Plan - 10/31/16 1256    Clinical Impression Statement Pt cooperative with unfamiliar therapist. Today's session targeted updating, organizing, and creating a table of contents for pt's memory notebook.  At this time, pt relies on her daughter to remind pt to refer to her book. SLP encouraged pt to refer to her book daily to familiarize herself with the new table of contents and where to find information. Continue ST intervention is recommended with focus on recall and attention, improving independence and decreasing caregiver burden.    Speech Therapy Frequency 2x / week   Duration --  8 weeks or 30 visits   Treatment/Interventions Compensatory strategies;Patient/family education;Functional tasks;Cueing hierarchy;Internal/external aids;Cognitive reorganization;SLP instruction and feedback   Potential to Achieve Goals Fair   Potential Considerations Severity of impairments;Previous level of function;Family/community support;Cooperation/participation level   Consulted and Agree with Plan of Care Patient;Family member/caregiver   Family Member Consulted daughter      Patient will benefit from skilled therapeutic intervention in order to improve the following deficits and impairments:   Cognitive communication deficit    Problem List Patient Active Problem List   Diagnosis Date Noted  . Seizure-like activity ([place]) 06/28/2016  . Cerebral infarction due to unspecified mechanism   . Gait difficulty   . Memory loss 05/22/2016  .  Confusion   . Acute ischemic stroke ([place]) 05/18/2016  . Morbid obesity ([place]) 09/30/2015  . Uncontrolled type 2 diabetes mellitus with circulatory disorder, without long-term current use of insulin ([place])   . Essential hypertension   . Cerebrovascular accident (CVA) due to thrombosis of cerebral artery ([place])   . CVA (cerebral infarction) 08/07/2015  . Pure hypercholesterolemia 10/04/2009   Taylan Mayhan B. Dotsero, MSP, CCC-SLP  Shonna Chock 10/31/2016, 1:02 PM  [facility] [address] [place], [name], [address] Phone: [phone]   Fax:  [phone]   Name: CHAITRA MAST MRN: [id number] Date of Birth: Oct 28, 1965

## 2016-11-02 ENCOUNTER — Ambulatory Visit: Payer: Commercial Managed Care - HMO | Admitting: Physical Therapy

## 2016-11-02 ENCOUNTER — Ambulatory Visit: Payer: Commercial Managed Care - HMO | Admitting: *Deleted

## 2016-11-02 ENCOUNTER — Encounter: Payer: Commercial Managed Care - HMO | Admitting: Occupational Therapy

## 2016-11-07 ENCOUNTER — Ambulatory Visit: Payer: Commercial Managed Care - HMO | Admitting: Occupational Therapy

## 2016-11-07 ENCOUNTER — Ambulatory Visit: Payer: Commercial Managed Care - HMO | Admitting: Physical Therapy

## 2016-11-07 ENCOUNTER — Encounter: Payer: Self-pay | Admitting: Physical Therapy

## 2016-11-07 ENCOUNTER — Ambulatory Visit: Payer: Commercial Managed Care - HMO | Admitting: *Deleted

## 2016-11-07 DIAGNOSIS — R2681 Unsteadiness on feet: Secondary | ICD-10-CM

## 2016-11-07 DIAGNOSIS — M6281 Muscle weakness (generalized): Secondary | ICD-10-CM

## 2016-11-07 DIAGNOSIS — R41842 Visuospatial deficit: Secondary | ICD-10-CM

## 2016-11-07 DIAGNOSIS — R2689 Other abnormalities of gait and mobility: Secondary | ICD-10-CM

## 2016-11-07 DIAGNOSIS — R278 Other lack of coordination: Secondary | ICD-10-CM

## 2016-11-07 DIAGNOSIS — I69315 Cognitive social or emotional deficit following cerebral infarction: Secondary | ICD-10-CM

## 2016-11-07 DIAGNOSIS — R41841 Cognitive communication deficit: Secondary | ICD-10-CM

## 2016-11-07 NOTE — Therapy (Signed)
[facility] [address] [place], [name], [address] Phone: [phone]   Fax:  [phone]  Occupational Therapy Treatment  Patient Details  Name: Annette Munoz MRN: [id number] Date of Birth: August 11, 1966 Referring Provider: Veneda Melter. Candiss Norse  Encounter Date: 11/07/2016      OT End of Session - 11/07/16 1130    Visit Number 22   Number of Visits 30   Date for OT Re-Evaluation 11/26/16   Authorization Type [employer] H[employer] 60 Visit Limit Combined    Authorization - Visit Number 5  visists used in 2018   Authorization - Number of Visits 11   OT Start Time 1102   OT Stop Time 1145   OT Time Calculation (min) 43 min   Activity Tolerance Patient tolerated treatment well   Behavior During Therapy WFL for tasks assessed/performed      Past Medical History:  Diagnosis Date  . Diabetes mellitus without complication ([place])   . Hypertension   . Stroke [facility])     Past Surgical History:  Procedure Laterality Date  . CESAREAN SECTION    . TEE WITHOUT CARDIOVERSION N/A 08/10/2015   Procedure: TRANSESOPHAGEAL ECHOCARDIOGRAM (TEE);  Surgeon: [name], MD;  Location: [facility] ENDOSCOPY;  Service: Cardiovascular;  Laterality: N/A;    There were no vitals filed for this visit.      Subjective Assessment - 11/07/16 1102    Subjective  Pt assisted in making baked chicken   Patient is accompained by: Family member  dtr   Patient Stated Goals to get better, memory, focusing   Currently in Pain? No/denies       Setting table (pt given directions to set 3 places with forks, cups, plates).  Pt needed cueing as she almost hit cane that was sitting by counter on R side.  Pt needed cueing x2 to recall directions/complete task.    Sweeping in standing with no LOB.  Sat to use dustpan (after cueing to avoid bending over due to fall risk).  Pt able to complete task and empty dustpan with no LOB.   Recommended pt perform at home with supervision due to fall risk and sit to use dustpan or use long handled dustpan.  Pt/dtr verbalized understanding.  Ambulating and performing environmental scanning in moderately busy environment with 11/14 items located initially, then additional 3 with 2nd attempt.  Needed min v.c. For last item.  All items missed initially were on R side.  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  Pt/dtr verbalized understanding.  Also advised against coloring/cutting hair due to safety concerns.  Also advised against using curling iron when doing her own hair.  Tabletop visual scanning/# cancellation task with 100% accuracy and incr time in min distracting environment.  Checked STGs--see goal section for details.                            OT Short Term Goals - 11/07/16 1132      OT SHORT TERM GOAL #1   Title Pt will be Mod I signs and symtpoms of CVA/TIA   Status Deferred     OT SHORT TERM GOAL #2   Title Pt will be Mod I home program RUE  supervision and mod cueing   Status Partially Met  09/27/16  per dtr supervision and min cueing,     OT SHORT TERM GOAL #3   Title Pt/family will I state 2-3 pieces of a/e &/or DME for home use for increased independnece and safety   Status Achieved  09/27/16 verbalizes walker and tub bench     OT SHORT TERM GOAL #4   Title Patient will dress lower body with mod cueing and intermittent mod assistance following set up.   Time 4   Period Weeks   Status Achieved  07/17/16:  min-mod A per dtr report     OT SHORT TERM GOAL #5   Title Patient will dress upper body with set up and minimal assistance   Time 4   Period Weeks   Status Achieved  07/17/16: per dtr report     OT SHORT TERM GOAL #6   Title Pt will perform simple snack/ beverage prep or cold meal prep with  supervision/ min v.c.   Baseline currently not performing consistently   Time 4   Period Weeks   Status Achieved     OT SHORT TERM GOAL #7   Title Pt will safely navigate a moderately distracting environment and locate items with 90% or better accuracy.   Baseline 09/27/16-85%  in min distracting environment   Time 4   Period Weeks   Status On-going  11/07/16:  79% accuracy in mod distracting environment     OT SHORT TERM GOAL #8   Title Pt will demonstrate improved fine motor coordination with RUE as evidenced by decreasing 9 hole peg test score to 34 secs or less.   Baseline RUE 38.50 secs, LUE 26.19 secs   Time 4   Period Weeks   Status On-going  11/07/16  38.85sec     OT SHORT TERM GOAL  #9   TITLE Pt will perform visual perceptual tasks with a cognitive component with 90% or better accuracy.   Baseline basic 1.49M number cancellation with 96% accuracy   Time 4   Period Weeks   Status Achieved  10/12/16 met at approx this level           OT Long Term Goals - 10/31/16 1525      OT LONG TERM GOAL #1   Title Pt will be Min A LB/UB Dressing and ADL's   Baseline Mod-max A   Time 8   Period Weeks   Status Achieved     OT LONG TERM GOAL #2   Title Pt will be Mod I-supervision updated HEP RUE   Time 8   Period Weeks   Status Not Met  supervision- min v.c. for HEP     OT LONG TERM GOAL #3   Title Pt will demonstrate improved RUE strength as seen by 3+/5 MMT   Baseline -3/5 RUE   Time 8   Period Weeks   Status Achieved     OT LONG TERM GOAL #4   Title Pt will demonstrate improved R hand coordination and grip strength as seen by improved Box & Blocks to 30 or greater and grip 30# or greater R.   Baseline Grip 19#; Box and Blocks = 13 (06/27/16)   Time 8   Period Weeks   Status Achieved  38 blocks, grip RUE 50 lbs     OT LONG TERM GOAL #5   Title Pt will demonstrate improved functional mobility and balance as seen by supervision assist for simulated shower  transfers   Baseline Moderate (06/27/16)   Time 8  Period Weeks   Status Achieved  08/17/16  Met per daughter      OT LONG TERM GOAL #6   Title Pt will perform simple snack/ cold meal prep and basic home management at a modified independent level demonstrating good safety awareness.   Time 8   Period Weeks   Status New     OT LONG TERM GOAL #7   Title Pt / family will demonstrate understanding of updated HEP.   Time 8   Period Weeks   Status New     OT LONG TERM GOAL #8   Title Pt will demonstrate improved standing balance and RUE endurance to perfom functional reaching activities mid-high range x 10 mins in standing without rest break or LOB.   Time 8   Period Weeks   Status New               Plan - 11/07/16 1131    Clinical Impression Statement Pt continues to progress towards goals, but memory and attention deficits as well as decr initiation at home remain a barrier to incr ADL/IADL participation.   Rehab Potential Fair   Clinical Impairments Affecting Rehab Potential severity of deficits; cognitive deficits   OT Frequency 2x / week   OT Duration 8 weeks   OT Treatment/Interventions Self-care/ADL training;Therapeutic exercise;DME and/or AE instruction;Therapeutic activities;Patient/family education;Balance training;Neuromuscular education;Energy conservation;Passive range of motion;Therapeutic exercises;Visual/perceptual remediation/compensation   Plan continue with environmental scanning, home maintenance tasks (loading dishwasher, laundry, making bed)   OT Home Exercise Plan Education Issued:  cane and simple coordination HEP; recommendations for cutting with scissors, braiding, stringing beads   Consulted and Agree with Plan of Care Patient;Family member/caregiver   Family Member Consulted Daughter      Patient will benefit from skilled therapeutic intervention in order to improve the following deficits and impairments:  Decreased balance, Decreased endurance,  Impaired vision/preception, Decreased range of motion, Decreased knowledge of precautions, Decreased cognition, Decreased activity tolerance, Decreased coordination, Decreased knowledge of use of DME, Decreased safety awareness, Decreased strength, Impaired flexibility, Impaired UE functional use, Decreased mobility, Difficulty walking, Impaired perceived functional ability, Abnormal gait, Impaired sensation  Visit Diagnosis: Cognitive social or emotional deficit following cerebral infarction  Unsteadiness on feet  Other abnormalities of gait and mobility  Other lack of coordination  Visuospatial deficit  Muscle weakness (generalized)    Problem List Patient Active Problem List   Diagnosis Date Noted  . Seizure-like activity ([place]) 06/28/2016  . Cerebral infarction due to unspecified mechanism   . Gait difficulty   . Memory loss 05/22/2016  . Confusion   . Acute ischemic stroke ([place]) 05/18/2016  . Morbid obesity ([place]) 09/30/2015  . Uncontrolled type 2 diabetes mellitus with circulatory disorder, without long-term current use of insulin ([place])   . Essential hypertension   . Cerebrovascular accident (CVA) due to thrombosis of cerebral artery ([place])   . CVA (cerebral infarction) 08/07/2015  . Pure hypercholesterolemia 10/04/2009    [facility] 11/07/2016, 1:27 PM  [facility] [address] [place], [name], [address] Phone: [phone]   Fax:  [phone]  Name: PRICELLA GAUGH MRN: [id number] Date of Birth: 06/21/1966   Vianne Bulls, OTR/L [facility] [address] [place], [place]  [address] [phone] phone [phone] 11/07/16 1:27 PM

## 2016-11-07 NOTE — Therapy (Signed)
[facility] [address] [place], [name], [address] Phone: [phone]   Fax:  [phone]  Speech Language Pathology Treatment  Patient Details  Name: Annette Munoz MRN: [id number] Date of Birth: December 14, 1965 Referring Provider: Dr. Lucianne Lei  Encounter Date: 11/07/2016      End of Session - 11/07/16 1241    Visit Number 21   Number of Visits 30   Date for SLP Re-Evaluation 12/15/16   SLP Start Time 1150   SLP Stop Time  9702   SLP Time Calculation (min) 45 min   Activity Tolerance Patient tolerated treatment well      Past Medical History:  Diagnosis Date  . Diabetes mellitus without complication ([place])   . Hypertension   . Stroke [facility])     Past Surgical History:  Procedure Laterality Date  . CESAREAN SECTION    . TEE WITHOUT CARDIOVERSION N/A 08/10/2015   Procedure: TRANSESOPHAGEAL ECHOCARDIOGRAM (TEE);  Surgeon: [name], MD;  Location: [facility] ENDOSCOPY;  Service: Cardiovascular;  Laterality: N/A;    There were no vitals filed for this visit.      Subjective Assessment - 11/07/16 1147    Subjective Daughter admitted they forgot the memory notebook today   Patient is accompained by: Family member   Special Tests daughter   Currently in Pain? No/denies               ADULT SLP TREATMENT - 11/07/16 0001      General Information   Behavior/Cognition Alert;Cooperative;Pleasant mood  tearful when discussing deficits     Treatment Provided   Treatment provided Cognitive-Linquistic     Pain Assessment   Pain Assessment No/denies pain     Cognitive-Linquistic Treatment   Treatment focused on Cognition;Patient/family/caregiver education   Skilled Treatment Pt seen for skilled ST session targeting cognitive goals, including awareness of deficits, attention, recall, compensatory strategies and education. Daughter reported they forgot her notebook this morning, but that she had started  setting alarms for pt to remember to check blood pressure and blood sugar, and to take meds. Pt apparently goes back to sleep after silencing the alarm, due (per dtr) to "poor initiative to get up and do anything". Pt was encouraged to take care of tasks when alarm goes off, THEN go back to sleep. Pt prepared dinner with assist x1 last week - encouraged to do so 3x this next week. Pt able to verbalize cognitive deficits including thinking, remembering, and focus with mod+ cues. When discussing increasing awareness of her cognitive deficits, pt became tearful. Pt completed category matrix with 77% accuracy, increasing to 100% with mod cues. Pt was encouraged to plan/prep 3 meals this week, go to work 2 days, and do laundry once. Per dtr, pt's husband is hesitant to allow her to do anything around the house, due to balance issues. SLP encouraged pt and dtr to talk with him, and suggest he allow her to do more in his presence, to increase safety and independence.      Assessment / Recommendations / Plan   Plan Continue with current plan of care     Progression Toward Goals   Progression toward goals Progressing toward goals          SLP Education - 11/07/16 1240    Education provided Yes   Education Details encouraged increasing activity at home - planning meals, going to work, doing Medical sales representative, Water quality scientist sent for homework   Person(s) Educated Patient;Child(ren)   Methods  Explanation;Demonstration   Comprehension Verbalized understanding;Need further instruction          SLP Short Term Goals - 11/07/16 1245      SLP SHORT TERM GOAL #1   Title Pt will utilize external aids/memory book to be oriented to date, appointments and daily chores with occasional min A over 3 sessions   Status Not Met     SLP SHORT TERM GOAL #2   Title Pt/family will report use of external aids/memory book at home for ADL's and medication mangement over 3 sessions   Time 4   Status Revised     SLP SHORT TERM  GOAL #3   Title Pt will verbalize 3 cognitive impairments with mod A   Status Achieved     SLP SHORT TERM GOAL #4   Title Pt will attend to simple cognitive linguistic tasks in distracting enviroment for 10 mintues with occasional min A   Time 4   Status Revised     SLP SHORT TERM GOAL #5   Title Pt will complete problemsolving/reasoning/thought organization tasks with 90% accuracy given min assist   Time 4   Period Weeks   Status New          SLP Long Term Goals - 11/07/16 1249      SLP LONG TERM GOAL #1   Title Pt/family will report use of external memory aids at home with rare min cues, for successful f/u on daily schedule, safety precautions, chores, meds, etc over 3 sessions.   Time 3   Period Weeks   Status On-going     SLP LONG TERM GOAL #2   Title Pt will verbalize 3 safety precautions due to cognitive impairments with mod A.    Time 3   Period Weeks   Status On-going     SLP LONG TERM GOAL #3   Title Pt will complete moderatley complex reasoning, organization, time and money problems with 75% accuracy and occasional min A   Time 3   Period Weeks   Status Revised     SLP LONG TERM GOAL #4   Title Pt will demonstrate emergent awareness by using memory compensation during therapy session x2 sessions   Time 3   Period Weeks   Status On-going          Plan - 11/07/16 1241    Clinical Impression Statement Pt slightly tearful when discussing deficit awareness today, but was able to switch to a different task without difficulty or continued tearfulness. Daughter reports pt has poor initiative at home, so patient was encouraged to increase activity level for return to work and independence at home. Pt indicates her goal is to "get right - back to work at [employer], housework and cooking". Continued ST intervention is recommended for improvement of cognitive skills, increasing independence and safety, and reducing caregiver burden.    Speech Therapy Frequency 2x / week    Duration --  8 weeks or 30 visits   Treatment/Interventions Compensatory strategies;Patient/family education;Functional tasks;Cueing hierarchy;Internal/external aids;Cognitive reorganization;SLP instruction and feedback   Potential to Achieve Goals Fair   Potential Considerations Severity of impairments;Previous level of function;Family/community support;Cooperation/participation level;Ability to learn/carryover information   SLP Home Exercise Plan reviewed with pt/dtr   Consulted and Agree with Plan of Care Patient;Family member/caregiver   Family Member Consulted daughter      Patient will benefit from skilled therapeutic intervention in order to improve the following deficits and impairments:   Cognitive communication deficit    Problem  List Patient Active Problem List   Diagnosis Date Noted  . Seizure-like activity ([place]) 06/28/2016  . Cerebral infarction due to unspecified mechanism   . Gait difficulty   . Memory loss 05/22/2016  . Confusion   . Acute ischemic stroke ([place]) 05/18/2016  . Morbid obesity ([place]) 09/30/2015  . Uncontrolled type 2 diabetes mellitus with circulatory disorder, without long-term current use of insulin ([place])   . Essential hypertension   . Cerebrovascular accident (CVA) due to thrombosis of cerebral artery ([place])   . CVA (cerebral infarction) 08/07/2015  . Pure hypercholesterolemia 10/04/2009   Anda Sobotta B. [place], MSP, CCC-SLP  Shonna Chock 11/07/2016, 12:51 PM  [facility] [address] [place], [name], [address] Phone: [phone]   Fax:  [phone]   Name: BRITTINI BRUBECK MRN: [id number] Date of Birth: Mar 29, 1966

## 2016-11-07 NOTE — Therapy (Signed)
[facility] [address] [place], [name], [address] Phone: [phone]   Fax:  [phone]  Physical Therapy Treatment  Patient Details  Name: Annette Munoz MRN: [id number] Date of Birth: 1965-11-05 Referring Provider: Rosalin Hawking  Encounter Date: 11/07/2016      PT End of Session - 11/07/16 1026    Visit Number 26   Number of Visits 32   Date for PT Re-Evaluation 11/29/16   Authorization Type [employer] 60 visit limit combined- started over with 2018 year   Authorization - Visit Number 6   Authorization - Number of Visits 60   PT Start Time 1022  pt late for apt today   PT Stop Time 1100   PT Time Calculation (min) 38 min   Equipment Utilized During Treatment Gait belt   Activity Tolerance Patient tolerated treatment well   Behavior During Therapy WFL for tasks assessed/performed      Past Medical History:  Diagnosis Date  . Diabetes mellitus without complication ([place])   . Hypertension   . Stroke [facility])     Past Surgical History:  Procedure Laterality Date  . CESAREAN SECTION    . TEE WITHOUT CARDIOVERSION N/A 08/10/2015   Procedure: TRANSESOPHAGEAL ECHOCARDIOGRAM (TEE);  Surgeon: [name], MD;  Location: [facility] ENDOSCOPY;  Service: Cardiovascular;  Laterality: N/A;    There were no vitals filed for this visit.      Subjective Assessment - 11/07/16 1026    Subjective No new complaints. No pain or falls to report.   Patient is accompained by: Family member   Pertinent History HTN, DM, CVA (x 2), with recent TIA per family report; weakness on R side new since 06/18/16 (Has been on hold for PT since 08/17/16 due to awaiting insurance verifications/clarification on visits remaining)   Patient Stated Goals Pt's goals for therapy are to "do everything I need to do."  Updated 09/29/16:  "To get rid of brace and walker"    Currently in Pain? No/denies   Pain Score 0-No pain            [facility] Adult PT  Treatment/Exercise - 11/07/16 1028      Transfers   Transfers Sit to Stand;Stand to Sit   Sit to Stand 5: Supervision;Without upper extremity assist;From chair/3-in-1   Stand to Sit 5: Supervision;Without upper extremity assist;To chair/3-in-1     Ambulation/Gait   Ambulation/Gait Yes   Ambulation/Gait Assistance 5: Supervision   Ambulation/Gait Assistance Details gait with cane while engaged in cognitive tasks and negotiating around furniture/obstacles. pt needed cues to stay on task (cognitive task) and to keep "walking". Pt with decreased gait speed and some veering noted when engaged in cognitive tasks.                          Ambulation Distance (Feet) --  >/= 800 feet   Assistive device Straight cane   Gait Pattern Step-to pattern;Decreased stance time - right;Decreased step length - left   Ambulation Surface Level;Indoor     Self-Care   Other Self-Care Comments  discussed work related tasks and how to safely perform them at this time. Refer to clinical impression statement for full details.                      Neuro Re-ed    Neuro Re-ed Details  blue/red mats with scattered ankle weights underneath for outdoor compliant surface simulation: gait across  mats x 2 laps with cane with min assist for balance corrections, cues for increased step height and step length                                   PT Short Term Goals - 10/26/16 1026      PT SHORT TERM GOAL #1   Title  UPDATED Goal:  Pt/family verblaize/demo progressive HEP for strength, balance, gait.  TARGET 10/29/16   Baseline (P)  Updated STGS per re-eval 09/29/16.  Met per daughter report and review from previous session.   Time 4   Period Weeks   Status (P)  Achieved     PT SHORT TERM GOAL #2   Title  UPDATED Goal:  Pt will transfer 8 fo 10 reps no UE support, sit<>stand for improved transfer efficiency.  TARGET 10/29/16   Baseline per reeval 09/29/16   Time 4   Period Weeks   Status Achieved     PT SHORT TERM  GOAL #3   Title  UPDATED GOAL Improve TUG score to less than or equal to 20 seconds for decreased fall risk.  TARGET 10/29/16   Baseline 24.81 sec with RW and 21.76 sec with cane at reeval 09/29/16   Time 4   Period Weeks   Status Not Met     PT SHORT TERM GOAL #4   Title Pt will negotiate at least 4 steps using 1 handrail, with min asistance, for improved safety with home stair negotiation.   Time 4   Period Weeks   Status Achieved     PT SHORT TERM GOAL #5   Title  UPDATED GOAL:  Pt will improve Berg score to at least 45/56 for decreased fall risk.  TARGET 10/29/16   Baseline 40/56 at reeval 09/29/16; 43/56 on 10/24/16   Time 4   Period Weeks   Status Not Met           PT Long Term Goals - 10/03/16 1210      PT LONG TERM GOAL #1   Title Pt/family will verbalize understanding of fall prevention within the home environment.  TARGET 08/25/16  UPDATED TARGET 11/29/16   Baseline Per re-eval 09/29/16   Time 8   Period Weeks   Status On-going     PT LONG TERM GOAL #2   Title  UPDATED GOAL:  Pt will improved gait velocity to at least 1.8 ft/sec with cane for improved household mobility.  TARGET 2/14/187   Baseline Met and revised per reeval 09/29/16-gait velocity 1.41 ft/sec with RW; 1.53 ft/sec with cane   Time 8   Period Weeks   Status Revised     PT LONG TERM GOAL #3   Title UPDATED GOAL:  Improve TUG score to less than or equal to 15 seconds for decreased fall risk.  TARGET 11/29/16   Baseline REvised as LTG per 09/29/16   Time 8   Period Weeks   Status Revised     PT LONG TERM GOAL #4   Title   UPDATED Goal:  DGI to be assessed and goal to be written as appropriate.  TARGET 11/29/16   Baseline Berg 40/56 at reeval 09/29/16 (new STG added to address BERG)   Time 8   Period Weeks   Status Revised     PT LONG TERM GOAL #5   Title UPDATED GOAL:  ambulate at least 1000 ft indoor/outdoor surfaces with  supervision.  TARGET 11/29/16   Baseline 500 ft met with RW per pt's  daughter report 09/29/16 re-eval   Time 8   Period Weeks   Status Revised     PT LONG TERM GOAL #6   Title Pt will negotiate at least 4 steps, 1 rail, with supervision of family, for improved safety with stair negotiation into/out of the home.  UPDATED TARGET 11/29/16   Time 8   Period Weeks   Status On-going            Plan - 11/07/16 1027    Clinical Impression Statement today's session continued to address gait with cognitive task/multi tasking with cues needed to stay on task and keep walking. Discussed work duties pt is wanting to return to with pt and daughter. agreed that for balance safety pt should sit down for sweeping up hair and reaching down for dust pan. At this time all dirty laundry (wet towels) is transported in trash bags by dragging them on floor to/from laundry. Discussed the challenges of this due to pt's weakness and balance deficits. Pt/daughter going to check into a rolling cart pt can push instead. Deferred hair washing to OT as pt would be able to prop on sink for balance support, unsure of UE strength and dexterity. Pt is making progress toward goals and should benefit from continued PT to progress toward unmet goals.                                             Clinical Impairments Affecting Rehab Potential Cognition (pt does have family support of husband and daughter)   PT Frequency 2x / week   PT Duration 8 weeks   PT Treatment/Interventions ADLs/Self Care Home Management;Functional mobility training;Stair training;Gait training;DME Instruction;Therapeutic activities;Therapeutic exercise;Balance training;Neuromuscular re-education;Patient/family education;Orthotic Fit/Training   PT Next Visit Plan cont gait/strengthening; trial work related activities to determine if pt safe to go back   Consulted and Agree with Plan of Care Patient;Family member/caregiver   Family Member Consulted daughter      Patient will benefit from skilled therapeutic intervention in  order to improve the following deficits and impairments:  Abnormal gait, Decreased balance, Decreased cognition, Decreased mobility, Decreased knowledge of use of DME, Decreased safety awareness, Decreased strength, Difficulty walking, Postural dysfunction, Impaired tone, Increased fascial restricitons  Visit Diagnosis: Unsteadiness on feet  Other abnormalities of gait and mobility  Muscle weakness (generalized)     Problem List Patient Active Problem List   Diagnosis Date Noted  . Seizure-like activity ([place]) 06/28/2016  . Cerebral infarction due to unspecified mechanism   . Gait difficulty   . Memory loss 05/22/2016  . Confusion   . Acute ischemic stroke ([place]) 05/18/2016  . Morbid obesity ([place]) 09/30/2015  . Uncontrolled type 2 diabetes mellitus with circulatory disorder, without long-term current use of insulin ([place])   . Essential hypertension   . Cerebrovascular accident (CVA) due to thrombosis of cerebral artery ([place])   . CVA (cerebral infarction) 08/07/2015  . Pure hypercholesterolemia 10/04/2009    Willow Ora, PTA, [facility] [address], [place], [place] [address] [phone] 11/07/16, 12:55 PM   Name: Annette Munoz MRN: [id number] Date of Birth: [birth year]

## 2016-11-09 ENCOUNTER — Ambulatory Visit: Payer: Commercial Managed Care - HMO | Admitting: Speech Pathology

## 2016-11-09 ENCOUNTER — Encounter: Payer: Self-pay | Admitting: Physical Therapy

## 2016-11-09 ENCOUNTER — Ambulatory Visit: Payer: Commercial Managed Care - HMO | Admitting: Physical Therapy

## 2016-11-09 ENCOUNTER — Ambulatory Visit: Payer: Commercial Managed Care - HMO | Admitting: Occupational Therapy

## 2016-11-09 DIAGNOSIS — M6281 Muscle weakness (generalized): Secondary | ICD-10-CM

## 2016-11-09 DIAGNOSIS — R41842 Visuospatial deficit: Secondary | ICD-10-CM

## 2016-11-09 DIAGNOSIS — R278 Other lack of coordination: Secondary | ICD-10-CM

## 2016-11-09 DIAGNOSIS — R2681 Unsteadiness on feet: Secondary | ICD-10-CM

## 2016-11-09 DIAGNOSIS — R41841 Cognitive communication deficit: Secondary | ICD-10-CM

## 2016-11-09 DIAGNOSIS — R2689 Other abnormalities of gait and mobility: Secondary | ICD-10-CM

## 2016-11-09 DIAGNOSIS — I69315 Cognitive social or emotional deficit following cerebral infarction: Secondary | ICD-10-CM

## 2016-11-09 NOTE — Therapy (Signed)
[facility] [address] [place], [name], [address] Phone: [phone]   Fax:  [phone]  Speech Language Pathology Treatment  Patient Details  Name: Annette Munoz MRN: [id number] Date of Birth: [birth year] Referring Provider: Dr. Lucianne Lei  Encounter Date: 11/09/2016      End of Session - 11/09/16 1035    Visit Number 22   Number of Visits 30   Date for SLP Re-Evaluation 12/15/16   SLP Start Time 0939   SLP Stop Time  1016   SLP Time Calculation (min) 37 min      Past Medical History:  Diagnosis Date  . Diabetes mellitus without complication ([place])   . Hypertension   . Stroke [facility])     Past Surgical History:  Procedure Laterality Date  . CESAREAN SECTION    . TEE WITHOUT CARDIOVERSION N/A 08/10/2015   Procedure: TRANSESOPHAGEAL ECHOCARDIOGRAM (TEE);  Surgeon: [name], MD;  Location: [facility] ENDOSCOPY;  Service: Cardiovascular;  Laterality: N/A;    There were no vitals filed for this visit.      Subjective Assessment - 11/09/16 0946    Subjective Pt made meal, washed dishes and made meal plan for 3 days   Patient is accompained by: Family member   Special Tests daughter   Currently in Pain? No/denies               ADULT SLP TREATMENT - 11/09/16 0947      General Information   Behavior/Cognition Alert;Cooperative;Pleasant mood     Treatment Provided   Treatment provided Cognitive-Linquistic     Pain Assessment   Pain Assessment No/denies pain     Cognitive-Linquistic Treatment   Skilled Treatment Faciltated mildly complex reasoning/functional money/math reasoning and attention to detail  with occasional mod A for reasoning however basic math 90%  accurate - attention to detail with occasional min A. Mildly complex card sort with 3 rules, pt required usual mod A to attend to all of the cards in her hand and min A to recall the 3 rules. Pt utlized her memory notebook to answer  questions re: date, future sessions and daily schedule with occasional min A.  Mod questioning cues to verbalize safety precautions when doing standing chores.     Assessment / Recommendations / Plan   Plan Continue with current plan of care     Progression Toward Goals   Progression toward goals Progressing toward goals            SLP Short Term Goals - 11/09/16 1033      SLP SHORT TERM GOAL #1   Title Pt will utilize external aids/memory book to be oriented to date, appointments and daily chores with occasional min A over 3 sessions   Status Not Met     SLP SHORT TERM GOAL #2   Title Pt/family will report use of external aids/memory book at home for ADL's and medication mangement over 3 sessions   Time 4   Status Revised     SLP SHORT TERM GOAL #3   Title Pt will verbalize 3 cognitive impairments with mod A   Status Achieved     SLP SHORT TERM GOAL #4   Title Pt will attend to simple cognitive linguistic tasks in distracting enviroment for 10 mintues with occasional min A   Time 4   Status Revised     SLP SHORT TERM GOAL #5   Title Pt will complete problemsolving/reasoning/thought organization tasks with 90% accuracy  given min assist   Time 4   Period Weeks   Status New          SLP Long Term Goals - 11/09/16 1034      SLP LONG TERM GOAL #1   Title Pt/family will report use of external memory aids at home with rare min cues, for successful f/u on daily schedule, safety precautions, chores, meds, etc over 3 sessions.   Baseline 08/15/16, 11/2   Time 3   Period Weeks   Status On-going     SLP LONG TERM GOAL #2   Title Pt will verbalize 3 safety precautions due to cognitive impairments with mod A.    Time 3   Period Weeks   Status On-going     SLP LONG TERM GOAL #3   Title Pt will complete moderatley complex reasoning, organization, time and money problems with 75% accuracy and occasional min A   Time 3   Period Weeks   Status Revised     SLP LONG TERM  GOAL #4   Title Pt will demonstrate emergent awareness by using memory compensation during therapy session x2 sessions   Baseline 08/15/16   Time 3   Period Weeks   Status On-going          Plan - 11/09/16 1030    Clinical Impression Statement Pt and daughter report successful completion of chores, including washing dishes, light cooking, and laundry. D/w pt return to work on modified duty of sweeping/hair washing. Pt required occasional min A for simple to mildly complex reasoning and attention to detail. Memory continues to require consistent mod to max A.   Speech Therapy Frequency 2x / week   Treatment/Interventions Compensatory strategies;Patient/family education;Functional tasks;Cueing hierarchy;Internal/external aids;Cognitive reorganization;SLP instruction and feedback   Potential to Achieve Goals Fair   Potential Considerations Severity of impairments;Previous level of function;Family/community support;Cooperation/participation level;Ability to learn/carryover information   SLP Home Exercise Plan reviewed with pt/dtr   Consulted and Agree with Plan of Care Patient;Family member/caregiver      Patient will benefit from skilled therapeutic intervention in order to improve the following deficits and impairments:   Cognitive communication deficit    Problem List Patient Active Problem List   Diagnosis Date Noted  . Seizure-like activity ([place]) 06/28/2016  . Cerebral infarction due to unspecified mechanism   . Gait difficulty   . Memory loss 05/22/2016  . Confusion   . Acute ischemic stroke ([place]) 05/18/2016  . Morbid obesity ([place]) 09/30/2015  . Uncontrolled type 2 diabetes mellitus with circulatory disorder, without long-term current use of insulin ([place])   . Essential hypertension   . Cerebrovascular accident (CVA) due to thrombosis of cerebral artery ([place])   . CVA (cerebral infarction) 08/07/2015  . Pure hypercholesterolemia 10/04/2009    Lovvorn, [name] MS,  CCC-SLP 11/09/2016, 10:37 AM  [facility] [address] [place], [name], [address] Phone: [phone]   Fax:  [phone]   Name: Annette Munoz MRN: [id number] Date of Birth: [birth year]

## 2016-11-09 NOTE — Therapy (Signed)
[facility] [address] [place], [name], [address] Phone: [phone]   Fax:  [phone]  Occupational Therapy Treatment  Patient Details  Name: Annette Munoz MRN: [id number] Date of Birth: [birth year] Referring Provider: Veneda Melter. Candiss Norse  Encounter Date: 11/09/2016      OT End of Session - 11/09/16 1306    Visit Number 23   Number of Visits 30   Date for OT Re-Evaluation 11/26/16   Authorization Type [employer] 60 Visit Limit Combined    Authorization - Visit Number 6   Authorization - Number of Visits 11   OT Start Time 1102   OT Stop Time 1145   OT Time Calculation (min) 43 min   Activity Tolerance Patient tolerated treatment well   Behavior During Therapy WFL for tasks assessed/performed      Past Medical History:  Diagnosis Date  . Diabetes mellitus without complication ([place])   . Hypertension   . Stroke [facility])     Past Surgical History:  Procedure Laterality Date  . CESAREAN SECTION    . TEE WITHOUT CARDIOVERSION N/A 08/10/2015   Procedure: TRANSESOPHAGEAL ECHOCARDIOGRAM (TEE);  Surgeon: [name], MD;  Location: [facility] ENDOSCOPY;  Service: Cardiovascular;  Laterality: N/A;    There were no vitals filed for this visit.      Subjective Assessment - 11/09/16 1311    Patient Stated Goals to get better, memory, focusing   Currently in Pain? No/denies            Treatment: Pt performed home management tasks: loading dishwasher and retrieving items from dryer then folding in standing without LOB. Copying small peg design for attention/ visual perceptual skills, min errors which pt was able to correct following v.c. Environmental scanning 12/13 located, on first pass, min v.c. To locate remaining card. Dynamic reaching in standing to retrieve graded clothespins from with RUE a lower surface and place on vertical antennae, no LOB, close supervision for  balance. Arm bike x 6 mins for conditioning, mod v.c. To maintain speed                     OT Short Term Goals - 11/07/16 1132      OT SHORT TERM GOAL #1   Title Pt will be Mod I signs and symtpoms of CVA/TIA   Status Deferred     OT SHORT TERM GOAL #2   Title Pt will be Mod I home program RUE  supervision and mod cueing   Status Partially Met  09/27/16 per dtr supervision and min cueing,     OT SHORT TERM GOAL #3   Title Pt/family will I state 2-3 pieces of a/e &/or DME for home use for increased independnece and safety   Status Achieved  09/27/16 verbalizes walker and tub bench     OT SHORT TERM GOAL #4   Title Patient will dress lower body with mod cueing and intermittent mod assistance following set up.   Time 4   Period Weeks   Status Achieved  07/17/16:  min-mod A per dtr report     OT SHORT TERM GOAL #5   Title Patient will dress upper body with set up and minimal assistance   Time 4   Period Weeks   Status Achieved  07/17/16: per dtr report     OT SHORT TERM GOAL #6   Title Pt will perform simple snack/ beverage prep or cold meal prep with  supervision/ min v.c.   Baseline currently not performing consistently   Time 4   Period Weeks   Status Achieved     OT SHORT TERM GOAL #7   Title Pt will safely navigate a moderately distracting environment and locate items with 90% or better accuracy.   Baseline 09/27/16-85%  in min distracting environment   Time 4   Period Weeks   Status On-going  11/07/16:  79% accuracy in mod distracting environment     OT SHORT TERM GOAL #8   Title Pt will demonstrate improved fine motor coordination with RUE as evidenced by decreasing 9 hole peg test score to 34 secs or less.   Baseline RUE 38.50 secs, LUE 26.19 secs   Time 4   Period Weeks   Status On-going  11/07/16  38.85sec     OT SHORT TERM GOAL  #9   TITLE Pt will perform visual perceptual tasks with a cognitive component with 90% or better accuracy.    Baseline basic 1.42M number cancellation with 96% accuracy   Time 4   Period Weeks   Status Achieved  10/12/16 met at approx this level           OT Long Term Goals - 10/31/16 1525      OT LONG TERM GOAL #1   Title Pt will be Min A LB/UB Dressing and ADL's   Baseline Mod-max A   Time 8   Period Weeks   Status Achieved     OT LONG TERM GOAL #2   Title Pt will be Mod I-supervision updated HEP RUE   Time 8   Period Weeks   Status Not Met  supervision- min v.c. for HEP     OT LONG TERM GOAL #3   Title Pt will demonstrate improved RUE strength as seen by 3+/5 MMT   Baseline -3/5 RUE   Time 8   Period Weeks   Status Achieved     OT LONG TERM GOAL #4   Title Pt will demonstrate improved R hand coordination and grip strength as seen by improved Box & Blocks to 30 or greater and grip 30# or greater R.   Baseline Grip 19#; Box and Blocks = 13 (06/27/16)   Time 8   Period Weeks   Status Achieved  38 blocks, grip RUE 50 lbs     OT LONG TERM GOAL #5   Title Pt will demonstrate improved functional mobility and balance as seen by supervision assist for simulated shower transfers   Baseline Moderate (06/27/16)   Time 8   Period Weeks   Status Achieved  08/17/16  Met per daughter      OT LONG TERM GOAL #6   Title Pt will perform simple snack/ cold meal prep and basic home management at a modified independent level demonstrating good safety awareness.   Time 8   Period Weeks   Status New     OT LONG TERM GOAL #7   Title Pt / family will demonstrate understanding of updated HEP.   Time 8   Period Weeks   Status New     OT LONG TERM GOAL #8   Title Pt will demonstrate improved standing balance and RUE endurance to perfom functional reaching activities mid-high range x 10 mins in standing without rest break or LOB.   Time 8   Period Weeks   Status New             Patient will benefit from skilled  therapeutic intervention in order to improve the following deficits  and impairments:     Visit Diagnosis: Cognitive social or emotional deficit following cerebral infarction  Unsteadiness on feet  Other abnormalities of gait and mobility  Other lack of coordination  Visuospatial deficit  Muscle weakness (generalized)    Problem List Patient Active Problem List   Diagnosis Date Noted  . Seizure-like activity ([place]) 06/28/2016  . Cerebral infarction due to unspecified mechanism   . Gait difficulty   . Memory loss 05/22/2016  . Confusion   . Acute ischemic stroke ([place]) 05/18/2016  . Morbid obesity ([place]) 09/30/2015  . Uncontrolled type 2 diabetes mellitus with circulatory disorder, without long-term current use of insulin ([place])   . Essential hypertension   . Cerebrovascular accident (CVA) due to thrombosis of cerebral artery ([place])   . CVA (cerebral infarction) 08/07/2015  . Pure hypercholesterolemia 10/04/2009    RINE,KATHRYN 11/09/2016, 1:14 PM  [facility] [address] [place], [name], [address] Phone: [phone]   Fax:  [phone]  Name: DEZIRE TURK MRN: [id number] Date of Birth: Mar 15, 1966

## 2016-11-10 NOTE — Therapy (Signed)
[facility] [address] [place], [name], [address] Phone: [phone]   Fax:  [phone]  Physical Therapy Treatment  Patient Details  Name: Annette Munoz MRN: [id number] Date of Birth: Jul 02, 1966 Referring Provider: Rosalin Hawking  Encounter Date: 11/09/2016      PT End of Session - 11/09/16 1021    Visit Number 27   Number of Visits 32   Date for PT Re-Evaluation 11/29/16   Authorization Type [employer] 60 visit limit combined- started over with 2018 year   Authorization - Visit Number 7   Authorization - Number of Visits 60   PT Start Time 1018   PT Stop Time 1100   PT Time Calculation (min) 42 min   Equipment Utilized During Treatment Gait belt   Activity Tolerance Patient tolerated treatment well   Behavior During Therapy WFL for tasks assessed/performed      Past Medical History:  Diagnosis Date  . Diabetes mellitus without complication ([employer])   . Hypertension   . Stroke [facility])     Past Surgical History:  Procedure Laterality Date  . CESAREAN SECTION    . TEE WITHOUT CARDIOVERSION N/A 08/10/2015   Procedure: TRANSESOPHAGEAL ECHOCARDIOGRAM (TEE);  Surgeon: [name], MD;  Location: [facility] ENDOSCOPY;  Service: Cardiovascular;  Laterality: N/A;    There were no vitals filed for this visit.      Subjective Assessment - 11/09/16 1021    Subjective No new complaints. No pain or falls to report.   Patient is accompained by: Family member   Pertinent History HTN, DM, CVA (x 2), with recent TIA per family report; weakness on R side new since 06/18/16 (Has been on hold for PT since 08/17/16 due to awaiting insurance verifications/clarification on visits remaining)   Patient Stated Goals Pt's goals for therapy are to "do everything I need to do."  Updated 09/29/16:  "To get rid of brace and walker"    Currently in Pain? No/denies   Pain Score 0-No pain             [facility] Adult PT Treatment/Exercise - 11/09/16  1024      Transfers   Transfers Sit to Stand;Stand to Sit     Ambulation/Gait   Ambulation/Gait Yes   Ambulation/Gait Assistance 4: Min assist;5: Supervision   Ambulation/Gait Assistance Details gait along ~50 foot hallway with cane: forward gait with head movements up<>down, left<>right x 4 laps each with min to mod assist for balance. significant veering noted with decreased gait speed, cues needed at times to "keep walking" when turning head               Assistive device Straight cane   Gait Pattern Step-to pattern;Decreased stance time - right;Decreased step length - left   Ambulation Surface Level;Indoor     High Level Balance   High Level Balance Activities Marching forwards;Marching backwards;Tandem walking  tandem fwd/bwd   High Level Balance Comments on red mats next to counter top with UE support at times, min guard to min assist for balance:            Balance Exercises - 11/09/16 1032      Balance Exercises: Standing   Balance Beam standing across blue foam balance beam: alternating fwd stepping to floor and back onto beam x 10 each leg, then alternating bwd stepping to floor and back onto beam x 10 reps each leg with min assist for balance, cues on posture and weight shifitng. seated  with feet across blue foam beam; sit<>stands x 10 reps with cues/emphasis on coming up into full upright posture and for slow, controlled descent with sitting down.                                 PT Short Term Goals - 10/26/16 1026      PT SHORT TERM GOAL #1   Title  UPDATED Goal:  Pt/family verblaize/demo progressive HEP for strength, balance, gait.  TARGET 10/29/16   Baseline (P)  Updated STGS per re-eval 09/29/16.  Met per daughter report and review from previous session.   Time 4   Period Weeks   Status (P)  Achieved     PT SHORT TERM GOAL #2   Title  UPDATED Goal:  Pt will transfer 8 fo 10 reps no UE support, sit<>stand for improved transfer efficiency.  TARGET 10/29/16    Baseline per reeval 09/29/16   Time 4   Period Weeks   Status Achieved     PT SHORT TERM GOAL #3   Title  UPDATED GOAL Improve TUG score to less than or equal to 20 seconds for decreased fall risk.  TARGET 10/29/16   Baseline 24.81 sec with RW and 21.76 sec with cane at reeval 09/29/16   Time 4   Period Weeks   Status Not Met     PT SHORT TERM GOAL #4   Title Pt will negotiate at least 4 steps using 1 handrail, with min asistance, for improved safety with home stair negotiation.   Time 4   Period Weeks   Status Achieved     PT SHORT TERM GOAL #5   Title  UPDATED GOAL:  Pt will improve Berg score to at least 45/56 for decreased fall risk.  TARGET 10/29/16   Baseline 40/56 at reeval 09/29/16; 43/56 on 10/24/16   Time 4   Period Weeks   Status Not Met           PT Long Term Goals - 10/03/16 1210      PT LONG TERM GOAL #1   Title Pt/family will verbalize understanding of fall prevention within the home environment.  TARGET 08/25/16  UPDATED TARGET 11/29/16   Baseline Per re-eval 09/29/16   Time 8   Period Weeks   Status On-going     PT LONG TERM GOAL #2   Title  UPDATED GOAL:  Pt will improved gait velocity to at least 1.8 ft/sec with cane for improved household mobility.  TARGET 2/14/187   Baseline Met and revised per reeval 09/29/16-gait velocity 1.41 ft/sec with RW; 1.53 ft/sec with cane   Time 8   Period Weeks   Status Revised     PT LONG TERM GOAL #3   Title UPDATED GOAL:  Improve TUG score to less than or equal to 15 seconds for decreased fall risk.  TARGET 11/29/16   Baseline REvised as LTG per 09/29/16   Time 8   Period Weeks   Status Revised     PT LONG TERM GOAL #4   Title   UPDATED Goal:  DGI to be assessed and goal to be written as appropriate.  TARGET 11/29/16   Baseline Berg 40/56 at reeval 09/29/16 (new STG added to address BERG)   Time 8   Period Weeks   Status Revised     PT LONG TERM GOAL #5   Title UPDATED GOAL:  ambulate at least  1000 ft  indoor/outdoor surfaces with supervision.  TARGET 11/29/16   Baseline 500 ft met with RW per pt's daughter report 09/29/16 re-eval   Time 8   Period Weeks   Status Revised     PT LONG TERM GOAL #6   Title Pt will negotiate at least 4 steps, 1 rail, with supervision of family, for improved safety with stair negotiation into/out of the home.  UPDATED TARGET 11/29/16   Time 8   Period Weeks   Status On-going           Plan - 11/09/16 1022    Clinical Impression Statement today's session focused on dynamic gait/balance activites with pt most challenged with head movements and compliant surfaces. Pt is making progress toward goals. Progress/POC/goals discussed with primary PT, Amy Marriott. At this time pt has began to plateu with therapy and has a hard visist limit with insurance. So to save visits for later in year if needed it was discussed and decided to wrap up PT next week with pt working at home and then potentially returning later in year to progress further if needed. Will discuss with pt/family at next session.                         Clinical Impairments Affecting Rehab Potential Cognition (pt does have family support of husband and daughter)   PT Frequency 2x / week   PT Duration 8 weeks   PT Treatment/Interventions ADLs/Self Care Home Management;Functional mobility training;Stair training;Gait training;DME Instruction;Therapeutic activities;Therapeutic exercise;Balance training;Neuromuscular re-education;Patient/family education;Orthotic Fit/Training   PT Next Visit Plan begin to look at LTGs for discharge (after discussion of this with pt and family)   Consulted and Agree with Plan of Care Patient;Family member/caregiver   Family Member Consulted daughter      Patient will benefit from skilled therapeutic intervention in order to improve the following deficits and impairments:  Abnormal gait, Decreased balance, Decreased cognition, Decreased mobility, Decreased knowledge of use of  DME, Decreased safety awareness, Decreased strength, Difficulty walking, Postural dysfunction, Impaired tone, Increased fascial restricitons  Visit Diagnosis: Unsteadiness on feet  Other abnormalities of gait and mobility  Other lack of coordination  Muscle weakness (generalized)     Problem List Patient Active Problem List   Diagnosis Date Noted  . Seizure-like activity ([place]) 06/28/2016  . Cerebral infarction due to unspecified mechanism   . Gait difficulty   . Memory loss 05/22/2016  . Confusion   . Acute ischemic stroke ([place]) 05/18/2016  . Morbid obesity ([place]) 09/30/2015  . Uncontrolled type 2 diabetes mellitus with circulatory disorder, without long-term current use of insulin ([place])   . Essential hypertension   . Cerebrovascular accident (CVA) due to thrombosis of cerebral artery ([place])   . CVA (cerebral infarction) 08/07/2015  . Pure hypercholesterolemia 10/04/2009    Willow Ora, PTA, [facility] [address] [place], [place] [address] [phone] 11/10/16, 1:46 PM   Name: Annette Munoz MRN: [id number] Date of Birth: 12-06-1965  Discussed the above regarding upcoming discharge discussion/plans with patient and family with Willow Ora PTA.  Will plan to discuss discharge with patient and family and check LTGs planning for d/c next week.  Mady Haagensen, PT 11/17/16 10:42 AM Phone: [phone] Fax: [phone]

## 2016-11-14 ENCOUNTER — Ambulatory Visit: Payer: Commercial Managed Care - HMO | Admitting: Physical Therapy

## 2016-11-14 ENCOUNTER — Ambulatory Visit: Payer: Commercial Managed Care - HMO | Admitting: Occupational Therapy

## 2016-11-14 ENCOUNTER — Ambulatory Visit: Payer: Commercial Managed Care - HMO | Admitting: *Deleted

## 2016-11-14 DIAGNOSIS — M6281 Muscle weakness (generalized): Secondary | ICD-10-CM

## 2016-11-14 DIAGNOSIS — R41842 Visuospatial deficit: Secondary | ICD-10-CM

## 2016-11-14 DIAGNOSIS — I69315 Cognitive social or emotional deficit following cerebral infarction: Secondary | ICD-10-CM

## 2016-11-14 DIAGNOSIS — R278 Other lack of coordination: Secondary | ICD-10-CM

## 2016-11-14 DIAGNOSIS — R41841 Cognitive communication deficit: Secondary | ICD-10-CM

## 2016-11-14 DIAGNOSIS — R2681 Unsteadiness on feet: Secondary | ICD-10-CM

## 2016-11-14 DIAGNOSIS — R2689 Other abnormalities of gait and mobility: Secondary | ICD-10-CM

## 2016-11-14 NOTE — Therapy (Signed)
[facility] [address] [place], [name], [address] Phone: [phone]   Fax:  [phone]  Occupational Therapy Treatment  Patient Details  Name: Annette Munoz MRN: [id number] Date of Birth: [birth year] Referring Provider: Veneda Melter. Candiss Norse  Encounter Date: 11/14/2016      OT End of Session - 11/14/16 1130    Visit Number 24   Number of Visits 30   Date for OT Re-Evaluation 11/26/16   Authorization Type [employer] 60 Visit Limit Combined    Authorization - Visit Number 7   Authorization - Number of Visits 11   OT Start Time 1109   OT Stop Time 1148   OT Time Calculation (min) 39 min   Activity Tolerance Patient tolerated treatment well   Behavior During Therapy WFL for tasks assessed/performed      Past Medical History:  Diagnosis Date  . Diabetes mellitus without complication ([place])   . Hypertension   . Stroke [facility])     Past Surgical History:  Procedure Laterality Date  . CESAREAN SECTION    . TEE WITHOUT CARDIOVERSION N/A 08/10/2015   Procedure: TRANSESOPHAGEAL ECHOCARDIOGRAM (TEE);  Surgeon: [name], MD;  Location: [facility] ENDOSCOPY;  Service: Cardiovascular;  Laterality: N/A;    There were no vitals filed for this visit.      Subjective Assessment - 11/14/16 1111    Subjective  Made lasagna in the crockpot, been folding clothes/washing clothes (dtr pushes laundry over), cleaned/seasoned chicken wings   Patient is accompained by: Family member  daughter   Patient Stated Goals to get better, memory, focusing   Currently in Pain? No/denies       Reviewed IADLs that pt has tried at home.  Reviewed recommendations for incr activity level with supervision for safety.  Reviewed ways to do environmental scanning at home.  Recommended pt not carry laundry basket, but can put in laundry/take out and fold.   Pt/dtr verbalized understanding.  Began checking goals and  discussing progress.  Recommended to d/c this week (as previously discussed) and pt/family continue to work on [place] pt participation in selected IADLs with supervision.  Discussed possible return to OT in approx 4-5 months with physician referral and indications for return to OT.  Pt/dtr verbalized understanding/agreement.  Environmental scanning in mod busy environment with 13/15 items found with first attempt and then additional item found on 2nd pass.  However, pt needed cueing due to getting too close to table/object on R side.  (in particularly busy area).  Simulated making bed by carrying sheet/pilowcases and spreading out on mat and putting on pillow cases.  Pt with no LOB when walking around mat without cane to spread sheet, but covered pillow cases and needed min questioning cues to remember to put on pillowcases/find pillowcases that she covered.  Completing 24 piece puzzle with occasional min questioning cues (did not complete due to time constraints).                           OT Short Term Goals - 11/14/16 1721      OT SHORT TERM GOAL #1   Title Pt will be Mod I signs and symtpoms of CVA/TIA   Status Deferred     OT SHORT TERM GOAL #2   Title Pt will be Mod I home program RUE  supervision and mod cueing   Status Partially Met  09/27/16 per dtr supervision and min  cueing,     OT SHORT TERM GOAL #3   Title Pt/family will I state 2-3 pieces of a/e &/or DME for home use for increased independnece and safety   Status Achieved  09/27/16 verbalizes walker and tub bench     OT SHORT TERM GOAL #4   Title Patient will dress lower body with mod cueing and intermittent mod assistance following set up.   Time 4   Period Weeks   Status Achieved  07/17/16:  min-mod A per dtr report     OT SHORT TERM GOAL #5   Title Patient will dress upper body with set up and minimal assistance   Time 4   Period Weeks   Status Achieved  07/17/16: per dtr report     OT SHORT  TERM GOAL #6   Title Pt will perform simple snack/ beverage prep or cold meal prep with supervision/ min v.c.   Baseline currently not performing consistently   Time 4   Period Weeks   Status Achieved     OT SHORT TERM GOAL #7   Title Pt will safely navigate a moderately distracting environment and locate items with 90% or better accuracy.   Baseline 09/27/16-85%  in min distracting environment   Time 4   Period Weeks   Status Not Met  11/07/16:  79% accuracy in mod distracting environment.  11/14/16  approx 87% in mod distracting environment     OT SHORT TERM GOAL #8   Title Pt will demonstrate improved fine motor coordination with RUE as evidenced by decreasing 9 hole peg test score to 34 secs or less.   Baseline RUE 38.50 secs, LUE 26.19 secs   Time 4   Period Weeks   Status Not Met  11/07/16  38.85sec     OT SHORT TERM GOAL  #9   TITLE Pt will perform visual perceptual tasks with a cognitive component with 90% or better accuracy.   Baseline basic 1.21M number cancellation with 96% accuracy   Time 4   Period Weeks   Status Achieved  10/12/16 met at approx this level           OT Long Term Goals - 11/14/16 1722      OT LONG TERM GOAL #1   Title Pt will be Min A LB/UB Dressing and ADL's   Baseline Mod-max A   Time 8   Period Weeks   Status Achieved     OT LONG TERM GOAL #2   Title Pt will be Mod I-supervision updated HEP RUE   Time 8   Period Weeks   Status Not Met  supervision- min v.c. for HEP     OT LONG TERM GOAL #3   Title Pt will demonstrate improved RUE strength as seen by 3+/5 MMT   Baseline -3/5 RUE   Time 8   Period Weeks   Status Achieved     OT LONG TERM GOAL #4   Title Pt will demonstrate improved R hand coordination and grip strength as seen by improved Box & Blocks to 30 or greater and grip 30# or greater R.   Baseline Grip 19#; Box and Blocks = 13 (06/27/16)   Time 8   Period Weeks   Status Achieved  38 blocks, grip RUE 50 lbs     OT LONG  TERM GOAL #5   Title Pt will demonstrate improved functional mobility and balance as seen by supervision assist for simulated shower transfers   Baseline Moderate (  06/27/16)   Time 8   Period Weeks   Status Achieved  08/17/16  Met per daughter      OT LONG TERM GOAL #6   Title Pt will perform simple snack/ cold meal prep and basic home management at a modified independent level demonstrating good safety awareness.   Time 8   Period Weeks   Status Partially Met  11/14/16:  supervision     OT LONG TERM GOAL #7   Title Pt / family will demonstrate understanding of updated HEP.   Time 8   Period Weeks   Status Achieved  11/14/16  met (HEP/recommendations for incr IADL participation)     OT LONG TERM GOAL #8   Title Pt will demonstrate improved standing balance and RUE endurance to perfom functional reaching activities mid-high range x 10 mins in standing without rest break or LOB.   Time 8   Period Weeks   Status New               Plan - 11/14/16 1130    Clinical Impression Statement Pt demo progressing with light home maintenance tasks with supervision.  Discussed d/c this week and possible return to occupational therapy (with new referral)  in 4-5 months so that pt/family to work on HEP/recommendations for home.  Pt/family verbalize understanding/agreement.  Discussed that cognitive deficits remain a barrier to IADLs.   Rehab Potential Fair   Clinical Impairments Affecting Rehab Potential severity of deficits; cognitive deficits   OT Frequency 2x / week   OT Duration 8 weeks   OT Treatment/Interventions Self-care/ADL training;Therapeutic exercise;DME and/or AE instruction;Therapeutic activities;Patient/family education;Balance training;Neuromuscular education;Energy conservation;Passive range of motion;Therapeutic exercises;Visual/perceptual remediation/compensation   Plan functional reaching in standing for endurance, check remaining goals and d/c, review indications for return  to OT (with MD referral)   OT Home Exercise Plan Education Issued:  cane and simple coordination HEP; recommendations for cutting with scissors, braiding, stringing beads   Consulted and Agree with Plan of Care Patient;Family member/caregiver   Family Member Consulted Daughter      Patient will benefit from skilled therapeutic intervention in order to improve the following deficits and impairments:  Decreased balance, Decreased endurance, Impaired vision/preception, Decreased range of motion, Decreased knowledge of precautions, Decreased cognition, Decreased activity tolerance, Decreased coordination, Decreased knowledge of use of DME, Decreased safety awareness, Decreased strength, Impaired flexibility, Impaired UE functional use, Decreased mobility, Difficulty walking, Impaired perceived functional ability, Abnormal gait, Impaired sensation  Visit Diagnosis: Cognitive social or emotional deficit following cerebral infarction  Visuospatial deficit  Unsteadiness on feet  Other abnormalities of gait and mobility  Other lack of coordination  Muscle weakness (generalized)    Problem List Patient Active Problem List   Diagnosis Date Noted  . Seizure-like activity ([place]) 06/28/2016  . Cerebral infarction due to unspecified mechanism   . Gait difficulty   . Memory loss 05/22/2016  . Confusion   . Acute ischemic stroke ([place]) 05/18/2016  . Morbid obesity ([place]) 09/30/2015  . Uncontrolled type 2 diabetes mellitus with circulatory disorder, without long-term current use of insulin ([place])   . Essential hypertension   . Cerebrovascular accident (CVA) due to thrombosis of cerebral artery ([place])   . CVA (cerebral infarction) 08/07/2015  . Pure hypercholesterolemia 10/04/2009    [facility] 11/14/2016, 7:28 PM  [facility] [address] [place], [name], [address] Phone: [phone]   Fax:  [phone]  Name: Annette Munoz MRN: [id number] Date of Birth: 1966-10-03  Vianne Bulls, OTR/[facility] [address] [place], [place]  [address] [phone] phone [phone] 11/14/16 7:28 PM

## 2016-11-14 NOTE — Therapy (Signed)
[facility] [address] [place], [name], [address] Phone: [phone]   Fax:  [phone]  Speech Language Pathology Treatment  Patient Details  Name: Annette Munoz MRN: [id number] Date of Birth: 17-Mar-1966 Referring Provider: Dr. Lucianne Lei  Encounter Date: 11/14/2016      End of Session - 11/14/16 1256    Visit Number 23   Number of Visits 30   Date for SLP Re-Evaluation 12/15/16   SLP Start Time 1150   SLP Stop Time  8466   SLP Time Calculation (min) 45 min   Activity Tolerance Patient tolerated treatment well      Past Medical History:  Diagnosis Date  . Diabetes mellitus without complication ([place])   . Hypertension   . Stroke [facility])     Past Surgical History:  Procedure Laterality Date  . CESAREAN SECTION    . TEE WITHOUT CARDIOVERSION N/A 08/10/2015   Procedure: TRANSESOPHAGEAL ECHOCARDIOGRAM (TEE);  Surgeon: [name], MD;  Location: [facility] ENDOSCOPY;  Service: Cardiovascular;  Laterality: N/A;    There were no vitals filed for this visit.      Subjective Assessment - 11/14/16 1153    Subjective Pt continues to wash clothes and dishes and cook at home. Per daughter, using memory book at home, but still requires max cues to do so.   Patient is accompained by: Family member   Currently in Pain? No/denies               ADULT SLP TREATMENT - 11/14/16 0001      General Information   Behavior/Cognition Alert;Cooperative;Pleasant mood     Treatment Provided   Treatment provided Cognitive-Linquistic     Pain Assessment   Pain Assessment No/denies pain     Cognitive-Linquistic Treatment   Treatment focused on Cognition;Patient/family/caregiver education   Skilled Treatment ST session focused on cognitive goals including use of compensatory strategies and awareness of deficits. Pt's daughter indicates pt using notebook more at home, but requires max cues to do so. Pt required max cues  from SLP and daughter to identify areas of focus for PT, OT and ST. Pt was able to list ingredients used in crock pot lasagne, and daughter reports she is washing clothes and dishes at home.      Assessment / Recommendations / Plan   Plan Continue with current plan of care     Progression Toward Goals   Progression toward goals Progressing toward goals          SLP Education - 11/14/16 1255    Education provided Yes   Education Details wrote focus of each therapy in pt notebook. encouraged pt to continue planning meals   Person(s) Educated Patient;Child(ren)   Methods Explanation;Demonstration;Handout   Comprehension Verbalized understanding;Need further instruction          SLP Short Term Goals - 11/14/16 1300      SLP SHORT TERM GOAL #1   Title Pt will utilize external aids/memory book to be oriented to date, appointments and daily chores with occasional min A over 3 sessions   Status Not Met     SLP SHORT TERM GOAL #2   Title Pt/family will report use of external aids/memory book at home for ADL's and medication mangement over 3 sessions   Time 3   Period Weeks   Status On-going     SLP SHORT TERM GOAL #3   Title Pt will verbalize 3 cognitive impairments with mod A   Status  Achieved     SLP SHORT TERM GOAL #4   Title Pt will attend to simple cognitive linguistic tasks in distracting enviroment for 10 mintues with occasional min A   Time 3   Period Weeks   Status On-going     SLP SHORT TERM GOAL #5   Title Pt will complete problemsolving/reasoning/thought organization tasks with 90% accuracy given min assist   Time 3   Period Weeks   Status On-going          SLP Long Term Goals - 11/14/16 1301      SLP LONG TERM GOAL #1   Title Pt/family will report use of external memory aids at home with rare min cues, for successful f/u on daily schedule, safety precautions, chores, meds, etc over 3 sessions.   Time 2   Period Weeks   Status On-going     SLP LONG  TERM GOAL #2   Title Pt will verbalize 3 safety precautions due to cognitive impairments with mod A.    Time 2   Period Weeks   Status On-going     SLP LONG TERM GOAL #3   Title Pt will complete moderatley complex reasoning, organization, time and money problems with 75% accuracy and occasional min A   Time 2   Period Weeks   Status On-going     SLP LONG TERM GOAL #4   Title Pt will demonstrate emergent awareness by using memory compensation during therapy session x2 sessions   Time 2   Period Weeks   Status On-going          Plan - 11/14/16 1256    Clinical Impression Statement Daughter reports pt is using notebook more frequently at home, but requires consistent cues to initiate use. Mod+ cues required to list focus of PT/OT/ST to increase awareness of deficits. Continued ST intervention is recommended to facilitate use of compensatory strategies for functional recall and  awareness of deficits to increase quality of life and decrease caregiver burden.   Speech Therapy Frequency 2x / week   Duration --  8 weeks or 30 visits   Treatment/Interventions Compensatory strategies;Patient/family education;Functional tasks;Cueing hierarchy;Internal/external aids;Cognitive reorganization;SLP instruction and feedback;Compensatory techniques   Potential to Achieve Goals Fair   Potential Considerations Severity of impairments;Previous level of function;Family/community support;Cooperation/participation level;Ability to learn/carryover information   SLP Home Exercise Plan reviewed with pt/dtr   Consulted and Agree with Plan of Care Patient;Family member/caregiver   Family Member Consulted daughter      Patient will benefit from skilled therapeutic intervention in order to improve the following deficits and impairments:   Cognitive communication deficit    Problem List Patient Active Problem List   Diagnosis Date Noted  . Seizure-like activity ([place]) 06/28/2016  . Cerebral infarction  due to unspecified mechanism   . Gait difficulty   . Memory loss 05/22/2016  . Confusion   . Acute ischemic stroke ([place]) 05/18/2016  . Morbid obesity ([place]) 09/30/2015  . Uncontrolled type 2 diabetes mellitus with circulatory disorder, without long-term current use of insulin ([place])   . Essential hypertension   . Cerebrovascular accident (CVA) due to thrombosis of cerebral artery ([place])   . CVA (cerebral infarction) 08/07/2015  . Pure hypercholesterolemia 10/04/2009   Celia B. [place], MSP, CCC-SLP  Shonna Chock 11/14/2016, 1:06 PM  [facility] [address] [place], [name], [address] Phone: [phone]   Fax:  [phone]   Name: BRECKLYN GALVIS MRN: [id number] Date of  Birth: July 08, 1966

## 2016-11-14 NOTE — Therapy (Signed)
[facility] [address] [place], [name], [address] Phone: [phone]   Fax:  [phone]  Physical Therapy Treatment  Patient Details  Name: Annette Munoz MRN: [id number] Date of Birth: October 14, 1966 Referring Provider: Rosalin Hawking  Encounter Date: 11/14/2016      PT End of Session - 11/14/16 1340    Visit Number 28   Number of Visits 32   Date for PT Re-Evaluation 11/29/16   Authorization Type [employer] 60 visit limit combined- started over with 2018 year   Authorization - Visit Number 8   Authorization - Number of Visits 60   PT Start Time 1237   PT Stop Time 1316   PT Time Calculation (min) 39 min   Equipment Utilized During Treatment Gait belt   Activity Tolerance Patient tolerated treatment well   Behavior During Therapy WFL for tasks assessed/performed      Past Medical History:  Diagnosis Date  . Diabetes mellitus without complication ([place])   . Hypertension   . Stroke [facility])     Past Surgical History:  Procedure Laterality Date  . CESAREAN SECTION    . TEE WITHOUT CARDIOVERSION N/A 08/10/2015   Procedure: TRANSESOPHAGEAL ECHOCARDIOGRAM (TEE);  Surgeon: [name], MD;  Location: [facility] ENDOSCOPY;  Service: Cardiovascular;  Laterality: N/A;    There were no vitals filed for this visit.      Subjective Assessment - 11/14/16 1327    Subjective No new complaints.  No falls.  Daughter reports walking some around home without cane.   Patient is accompained by: Family member  daughter   Pertinent History HTN, DM, CVA (x 2), with recent TIA per family report; weakness on R side new since 06/18/16 (Has been on hold for PT since 08/17/16 due to awaiting insurance verifications/clarification on visits remaining)   Patient Stated Goals Pt's goals for therapy are to "do everything I need to do."  Updated 09/29/16:  "To get rid of brace and walker"    Currently in Pain? No/denies                          [facility] Adult PT Treatment/Exercise - 11/14/16 0001      Ambulation/Gait   Ambulation/Gait Yes  See DGI and TUG below   Ambulation/Gait Assistance 5: Supervision   Ambulation/Gait Assistance Details Short distance gait to and from rolling chair(5-8 ft) no device, with widened BOS and reaching for chair to sit.     Standardized Balance Assessment   Standardized Balance Assessment Dynamic Gait Index;Timed Up and Go Test     Dynamic Gait Index   Level Surface Moderate Impairment   Change in Gait Speed Mild Impairment   Gait with Horizontal Head Turns Moderate Impairment   Gait with Vertical Head Turns Moderate Impairment   Gait and Pivot Turn Moderate Impairment   Step Over Obstacle Moderate Impairment   Step Around Obstacles Mild Impairment   Steps Moderate Impairment   Total Score 10     Timed Up and Go Test   TUG Normal TUG   Normal TUG (seconds) 27.38  with cane     Self-Care   Self-Care Other Self-Care Comments   Other Self-Care Comments  Discussed POC, plateau with therapy goals at this time, plans for discharge later this week (to help preserve visits for PT possibly later in the year).  Discussed options for community fitness ([employer], [facility], [employer]) for fitness rooms and for  aquatic activities     Therapeutic Activites    Therapeutic Activities Work Simulation   Work Chemical engineer work tasks in shop, included seated (in [place] chair), sweeping tasks, with moving chair forward/back and to the side.  No LOB in chair noted.  Pt noted to have neglect of RLE with movement of chair using lower extremities.  Additional forward/back practice in rolling chair, with cues to use legs equally.                PT Education - 11/14/16 1336    Education provided Yes   Education Details Provided handouts on [employer] fitness room and aquatics; discussed [employer] and [employer] as options for community fitness  upon D/C; discussed POC with discharge plans next visit   Person(s) Educated Patient;Child(ren)   Methods Explanation;Demonstration   Comprehension Verbalized understanding          PT Short Term Goals - 10/26/16 1026      PT SHORT TERM GOAL #1   Title  UPDATED Goal:  Pt/family verblaize/demo progressive HEP for strength, balance, gait.  TARGET 10/29/16   Baseline (P)  Updated STGS per re-eval 09/29/16.  Met per daughter report and review from previous session.   Time 4   Period Weeks   Status (P)  Achieved     PT SHORT TERM GOAL #2   Title  UPDATED Goal:  Pt will transfer 8 fo 10 reps no UE support, sit<>stand for improved transfer efficiency.  TARGET 10/29/16   Baseline per reeval 09/29/16   Time 4   Period Weeks   Status Achieved     PT SHORT TERM GOAL #3   Title  UPDATED GOAL Improve TUG score to less than or equal to 20 seconds for decreased fall risk.  TARGET 10/29/16   Baseline 24.81 sec with RW and 21.76 sec with cane at reeval 09/29/16   Time 4   Period Weeks   Status Not Met     PT SHORT TERM GOAL #4   Title Pt will negotiate at least 4 steps using 1 handrail, with min asistance, for improved safety with home stair negotiation.   Time 4   Period Weeks   Status Achieved     PT SHORT TERM GOAL #5   Title  UPDATED GOAL:  Pt will improve Berg score to at least 45/56 for decreased fall risk.  TARGET 10/29/16   Baseline 40/56 at reeval 09/29/16; 43/56 on 10/24/16   Time 4   Period Weeks   Status Not Met           PT Long Term Goals - 11/14/16 1315      PT LONG TERM GOAL #1   Title Pt/family will verbalize understanding of fall prevention within the home environment.  TARGET 08/25/16  UPDATED TARGET 11/29/16   Baseline Per re-eval 09/29/16   Time 8   Period Weeks   Status On-going     PT LONG TERM GOAL #2   Title  UPDATED GOAL:  Pt will improved gait velocity to at least 1.8 ft/sec with cane for improved household mobility.  TARGET 2/14/187   Baseline Met and  revised per reeval 09/29/16-gait velocity 1.41 ft/sec with RW; 1.53 ft/sec with cane   Time 8   Period Weeks   Status Revised     PT LONG TERM GOAL #3   Title UPDATED GOAL:  Improve TUG score to less than or equal to 15 seconds for decreased fall risk.  TARGET  11/29/16   Baseline REvised as LTG per 09/29/16   Time 8   Period Weeks   Status Not Met     PT LONG TERM GOAL #4   Title   UPDATED Goal:  DGI to be assessed and goal to be written as appropriate. (>12/24)  TARGET 11/29/16   Baseline DGI score 10/24   Time 8   Period Weeks   Status Not Met     PT LONG TERM GOAL #5   Title UPDATED GOAL:  ambulate at least 1000 ft indoor/outdoor surfaces with supervision.  TARGET 11/29/16   Baseline 500 ft met with RW per pt's daughter report 09/29/16 re-eval   Time 8   Period Weeks   Status On-going     PT LONG TERM GOAL #6   Title Pt will negotiate at least 4 steps, 1 rail, with supervision of family, for improved safety with stair negotiation into/out of the home.  UPDATED TARGET 11/29/16   Time 8   Period Weeks   Status Achieved               Plan - 11/14/16 1341    Clinical Impression Statement Checked several LTGs today, with pt's DGI score 10/24 and TUG score >27 sec, with pt remaining at fall risk.  Discussed with patient and daughter about possibility of discharge later this week due to plateau in progress with gait at this time.  Pt appears safe with gait using cane with supervision, and would not likely be safe to progress to full gait without cane.  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  Pt and daughter in agreement with discharge next visit.   Clinical Impairments Affecting Rehab Potential Cognition (pt does have family support of husband and daughter)   PT Frequency 2x / week   PT Duration 8 weeks   PT Treatment/Interventions ADLs/Self Care Home Management;Functional mobility training;Stair training;Gait  training;DME Instruction;Therapeutic activities;Therapeutic exercise;Balance training;Neuromuscular re-education;Patient/family education;Orthotic Fit/Training   PT Next Visit Plan Check remaining goals and plan for disharge next visit; review plan for continued fitness   Consulted and Agree with Plan of Care Patient;Family member/caregiver   Family Member Consulted daughter      Patient will benefit from skilled therapeutic intervention in order to improve the following deficits and impairments:  Abnormal gait, Decreased balance, Decreased cognition, Decreased mobility, Decreased knowledge of use of DME, Decreased safety awareness, Decreased strength, Difficulty walking, Postural dysfunction, Impaired tone, Increased fascial restricitons  Visit Diagnosis: Other abnormalities of gait and mobility  Unsteadiness on feet     Problem List Patient Active Problem List   Diagnosis Date Noted  . Seizure-like activity ([place]) 06/28/2016  . Cerebral infarction due to unspecified mechanism   . Gait difficulty   . Memory loss 05/22/2016  . Confusion   . Acute ischemic stroke ([place]) 05/18/2016  . Morbid obesity ([place]) 09/30/2015  . Uncontrolled type 2 diabetes mellitus with circulatory disorder, without long-term current use of insulin ([place])   . Essential hypertension   . Cerebrovascular accident (CVA) due to thrombosis of cerebral artery ([place])   . CVA (cerebral infarction) 08/07/2015  . Pure hypercholesterolemia 10/04/2009    Sarahbeth Cashin W. 11/14/2016, 1:55 PM [facility] [address] [place], [name], [address] Phone: [phone]   Fax:  [phone]  Name: Annette Munoz MRN: [id number] Date of Birth: [birth year]

## 2016-11-16 ENCOUNTER — Ambulatory Visit: Payer: Commercial Managed Care - HMO | Attending: Family Medicine | Admitting: Occupational Therapy

## 2016-11-16 ENCOUNTER — Encounter: Payer: Commercial Managed Care - HMO | Admitting: *Deleted

## 2016-11-16 ENCOUNTER — Ambulatory Visit: Payer: Commercial Managed Care - HMO | Admitting: Physical Therapy

## 2016-11-16 ENCOUNTER — Ambulatory Visit: Payer: Commercial Managed Care - HMO

## 2016-11-16 ENCOUNTER — Encounter: Payer: Self-pay | Admitting: Physical Therapy

## 2016-11-16 DIAGNOSIS — R2689 Other abnormalities of gait and mobility: Secondary | ICD-10-CM | POA: Insufficient documentation

## 2016-11-16 DIAGNOSIS — M6281 Muscle weakness (generalized): Secondary | ICD-10-CM

## 2016-11-16 DIAGNOSIS — R41841 Cognitive communication deficit: Secondary | ICD-10-CM

## 2016-11-16 DIAGNOSIS — R2681 Unsteadiness on feet: Secondary | ICD-10-CM | POA: Insufficient documentation

## 2016-11-16 DIAGNOSIS — R41842 Visuospatial deficit: Secondary | ICD-10-CM | POA: Diagnosis not present

## 2016-11-16 DIAGNOSIS — R278 Other lack of coordination: Secondary | ICD-10-CM

## 2016-11-16 DIAGNOSIS — I69315 Cognitive social or emotional deficit following cerebral infarction: Secondary | ICD-10-CM | POA: Diagnosis present

## 2016-11-16 NOTE — Therapy (Signed)
[facility] [address] [place], [name], [address] Phone: [phone]   Fax:  [phone]  Physical Therapy Treatment  Patient Details  Name: Annette Munoz MRN: [id number] Date of Birth: 17-Feb-1966 Referring Provider: Rosalin Hawking  Encounter Date: 11/16/2016      PT End of Session - 11/16/16 1022    Visit Number 29   Number of Visits 32   Date for PT Re-Evaluation 11/29/16   Authorization Type [employer] 60 visit limit combined- started over with 2018 year   Authorization - Visit Number 9   Authorization - Number of Visits 60   PT Start Time 1020   PT Stop Time 1100   PT Time Calculation (min) 40 min   Equipment Utilized During Treatment Gait belt   Activity Tolerance Patient tolerated treatment well   Behavior During Therapy WFL for tasks assessed/performed      Past Medical History:  Diagnosis Date  . Diabetes mellitus without complication ([place])   . Hypertension   . Stroke [facility])     Past Surgical History:  Procedure Laterality Date  . CESAREAN SECTION    . TEE WITHOUT CARDIOVERSION N/A 08/10/2015   Procedure: TRANSESOPHAGEAL ECHOCARDIOGRAM (TEE);  Surgeon: [name], MD;  Location: [facility] ENDOSCOPY;  Service: Cardiovascular;  Laterality: N/A;    There were no vitals filed for this visit.      Subjective Assessment - 11/16/16 1022    Subjective No new complaints. No falls or pain to report.   Patient is accompained by: Family member   Pertinent History HTN, DM, CVA (x 2), with recent TIA per family report; weakness on R side new since 06/18/16 (Has been on hold for PT since 08/17/16 due to awaiting insurance verifications/clarification on visits remaining)   Patient Stated Goals Pt's goals for therapy are to "do everything I need to do."  Updated 09/29/16:  "To get rid of brace and walker"    Currently in Pain? No/denies   Pain Score 0-No pain            [facility] Adult PT Treatment/Exercise - 11/16/16  1024      Transfers   Transfers Sit to Stand;Stand to Sit     Ambulation/Gait   Ambulation/Gait Yes   Ambulation/Gait Assistance 5: Supervision   Ambulation Distance (Feet) 1035 Feet   Assistive device Straight cane   Gait Pattern Step-to pattern;Decreased stance time - right;Decreased step length - left   Ambulation Surface Level;Indoor   Gait velocity 1.67 ft/sec with cane   Stairs Yes   Stairs Assistance 5: Supervision   Stairs Assistance Details (indicate cue type and reason) cues to advance hand along rail and on weight shifting. step to pattern used both up and down stairs.   Stair Management Technique One rail Left;Step to pattern;Forwards     Timed Up and Go Test   TUG Normal TUG   Normal TUG (seconds) 22.84  with cane             PT Short Term Goals - 10/26/16 1026      PT SHORT TERM GOAL #1   Title  UPDATED Goal:  Pt/family verblaize/demo progressive HEP for strength, balance, gait.  TARGET 10/29/16   Baseline (P)  Updated STGS per re-eval 09/29/16.  Met per daughter report and review from previous session.   Time 4   Period Weeks   Status (P)  Achieved     PT SHORT TERM GOAL #2   Title  UPDATED Goal:  Pt will transfer 8 fo 10 reps no UE support, sit<>stand for improved transfer efficiency.  TARGET 10/29/16   Baseline per reeval 09/29/16   Time 4   Period Weeks   Status Achieved     PT SHORT TERM GOAL #3   Title  UPDATED GOAL Improve TUG score to less than or equal to 20 seconds for decreased fall risk.  TARGET 10/29/16   Baseline 24.81 sec with RW and 21.76 sec with cane at reeval 09/29/16   Time 4   Period Weeks   Status Not Met     PT SHORT TERM GOAL #4   Title Pt will negotiate at least 4 steps using 1 handrail, with min asistance, for improved safety with home stair negotiation.   Time 4   Period Weeks   Status Achieved     PT SHORT TERM GOAL #5   Title  UPDATED GOAL:  Pt will improve Berg score to at least 45/56 for decreased fall risk.  TARGET  10/29/16   Baseline 40/56 at reeval 09/29/16; 43/56 on 10/24/16   Time 4   Period Weeks   Status Not Met           PT Long Term Goals - 11/16/16 1023      PT LONG TERM GOAL #1   Title Pt/family will verbalize understanding of fall prevention within the home environment.  TARGET 08/25/16  UPDATED TARGET 11/29/16   Baseline 11/16/16: pt/daughter where able to state fall prevention strategies that have been provided in the past.   Time 8   Period Weeks   Status Achieved     PT LONG TERM GOAL #2   Title  UPDATED GOAL:  Pt will improved gait velocity to at least 1.8 ft/sec with cane for improved household mobility.  TARGET 2/14/187   Baseline 11/16/16: 19.97 sec's= 1.67 ft/sec with cane- not to goals, however improved from 1.53 ft/sec with cane   Time --   Period --   Status Partially Met     PT LONG TERM GOAL #3   Title UPDATED GOAL:  Improve TUG score to less than or equal to 15 seconds for decreased fall risk.  TARGET 11/29/16   Baseline 11/16/16: 22.84 sec's with cane today, goal not met however decreased time from last testing   Time --   Period --   Status Partially Met     PT LONG TERM GOAL #4   Title   UPDATED Goal:  DGI to be assessed and goal to be written as appropriate. (>12/24)  TARGET 11/29/16   Baseline DGI score 10/24   Status Not Met     PT LONG TERM GOAL #5   Title UPDATED GOAL:  ambulate at least 1000 ft indoor/outdoor surfaces with supervision.  TARGET 11/29/16   Baseline 11/16/16: met distance on indoor surfaces only with sueprvision with cane, outside not tested today due to cold weather and pt not wanting to go outside   Time --   Period --   Status Partially Met     PT LONG TERM GOAL #6   Title Pt will negotiate at least 4 steps, 1 rail, with supervision of family, for improved safety with stair negotiation into/out of the home.  UPDATED TARGET 11/29/16   Baseline 11/16/16: met today   Status Achieved            Plan - 11/16/16 1022    Clinical Impression  Statement Checked remaining LTGs today for anticipated  discharge. Pt has met 2/6 LTGs and partially met the remaining goals. Pt/daughter agreeable to PT discharge today.    Clinical Impairments Affecting Rehab Potential Cognition (pt does have family support of husband and daughter)   PT Frequency 2x / week   PT Duration 8 weeks   PT Treatment/Interventions ADLs/Self Care Home Management;Functional mobility training;Stair training;Gait training;DME Instruction;Therapeutic activities;Therapeutic exercise;Balance training;Neuromuscular re-education;Patient/family education;Orthotic Fit/Training   PT Next Visit Plan discharge per PT plan of care.   Consulted and Agree with Plan of Care Patient;Family member/caregiver   Family Member Consulted daughter      Patient will benefit from skilled therapeutic intervention in order to improve the following deficits and impairments:  Abnormal gait, Decreased balance, Decreased cognition, Decreased mobility, Decreased knowledge of use of DME, Decreased safety awareness, Decreased strength, Difficulty walking, Postural dysfunction, Impaired tone, Increased fascial restricitons  Visit Diagnosis: Muscle weakness (generalized)  Other abnormalities of gait and mobility  Unsteadiness on feet     Problem List Patient Active Problem List   Diagnosis Date Noted  . Seizure-like activity ([place]) 06/28/2016  . Cerebral infarction due to unspecified mechanism   . Gait difficulty   . Memory loss 05/22/2016  . Confusion   . Acute ischemic stroke ([place]) 05/18/2016  . Morbid obesity ([place]) 09/30/2015  . Uncontrolled type 2 diabetes mellitus with circulatory disorder, without long-term current use of insulin ([place])   . Essential hypertension   . Cerebrovascular accident (CVA) due to thrombosis of cerebral artery ([place])   . CVA (cerebral infarction) 08/07/2015  . Pure hypercholesterolemia 10/04/2009   Willow Ora, PTA, [facility] [address], [place], [place] [address] [phone] 11/16/16, 2:29 PM   Name: Annette Munoz MRN: [id number] Date of Birth: 08-31-1966

## 2016-11-16 NOTE — Therapy (Signed)
[facility] [address] [place], [name], [address] Phone: [phone]   Fax:  [phone]  Occupational Therapy Treatment  Patient Details  Name: Annette Munoz MRN: [id number] Date of Birth: [birth year] Referring Provider: Veneda Melter. Candiss Norse  Encounter Date: 11/16/2016      OT End of Session - 11/16/16 1220    Visit Number 25   Number of Visits 30   Date for OT Re-Evaluation 11/26/16   Authorization Type [employer] HMO 66 Visit Limit Combined    Authorization Time Period Pt 71 daughter says insurance told her that pt had 18 more visits for the year, therapist conveyed that this is not consistnet with the information to therapist, pt and dtr would like to continue therapy anyway.   Authorization - Visit Number 8   Authorization - Number of Visits 11   OT Start Time 1105   OT Stop Time 1145   OT Time Calculation (min) 40 min      Past Medical History:  Diagnosis Date  . Diabetes mellitus without complication ([place])   . Hypertension   . Stroke [facility])     Past Surgical History:  Procedure Laterality Date  . CESAREAN SECTION    . TEE WITHOUT CARDIOVERSION N/A 08/10/2015   Procedure: TRANSESOPHAGEAL ECHOCARDIOGRAM (TEE);  Surgeon: [name], MD;  Location: [facility] ENDOSCOPY;  Service: Cardiovascular;  Laterality: N/A;    There were no vitals filed for this visit.      Subjective Assessment - 11/16/16 1227    Patient Stated Goals to get better, memory, focusing   Currently in Pain? No/denies            Treatment: Therapist checked progress towards goals.  Ambulating while performing environmental scanning, no LOB, good accuracy with environmental scanning . Standing to copy small peg design  With RUE, At mid to high level, x 15 mins, no LOB, no rest breaks. Completing 25 piece puzzle with min-mod v.c for organization/ scanning                     OT Short Term  Goals - 11/14/16 1721      OT SHORT TERM GOAL #1   Title Pt will be Mod I signs and symtpoms of CVA/TIA   Status Deferred     OT SHORT TERM GOAL #2   Title Pt will be Mod I home program RUE  supervision and mod cueing   Status Partially Met  09/27/16 per dtr supervision and min cueing,     OT SHORT TERM GOAL #3   Title Pt/family will I state 2-3 pieces of a/e &/or DME for home use for increased independnece and safety   Status Achieved  09/27/16 verbalizes walker and tub bench     OT SHORT TERM GOAL #4   Title Patient will dress lower body with mod cueing and intermittent mod assistance following set up.   Time 4   Period Weeks   Status Achieved  07/17/16:  min-mod A per dtr report     OT SHORT TERM GOAL #5   Title Patient will dress upper body with set up and minimal assistance   Time 4   Period Weeks   Status Achieved  07/17/16: per dtr report     OT SHORT TERM GOAL #6   Title Pt will perform simple snack/ beverage prep or cold meal prep with supervision/ min v.c.   Baseline currently not performing consistently  Time 4   Period Weeks   Status Achieved     OT SHORT TERM GOAL #7   Title Pt will safely navigate a moderately distracting environment and locate items with 90% or better accuracy.   Baseline 09/27/16-85%  in min distracting environment   Time 4   Period Weeks   Status Not Met  11/07/16:  79% accuracy in mod distracting environment.  11/14/16  approx 87% in mod distracting environment     OT SHORT TERM GOAL #8   Title Pt will demonstrate improved fine motor coordination with RUE as evidenced by decreasing 9 hole peg test score to 34 secs or less.   Baseline RUE 38.50 secs, LUE 26.19 secs   Time 4   Period Weeks   Status Not Met  11/07/16  38.85sec     OT SHORT TERM GOAL  #9   TITLE Pt will perform visual perceptual tasks with a cognitive component with 90% or better accuracy.   Baseline basic 1.32M number cancellation with 96% accuracy   Time 4   Period  Weeks   Status Achieved  10/12/16 met at approx this level           OT Long Term Goals - 11/16/16 1253      OT LONG TERM GOAL #1   Title Pt will be Min A LB/UB Dressing and ADL's   Baseline Mod-max A   Time 8   Period Weeks   Status Achieved     OT LONG TERM GOAL #2   Title Pt will be Mod I-supervision updated HEP RUE   Time 8   Period Weeks   Status Not Met  supervision- min v.c. for HEP     OT LONG TERM GOAL #3   Title Pt will demonstrate improved RUE strength as seen by 3+/5 MMT   Baseline -3/5 RUE   Time 8   Period Weeks   Status Achieved     OT LONG TERM GOAL #4   Title Pt will demonstrate improved R hand coordination and grip strength as seen by improved Box & Blocks to 30 or greater and grip 30# or greater R.   Baseline Grip 19#; Box and Blocks = 13 (06/27/16)   Time 8   Period Weeks   Status Achieved  38 blocks, grip RUE 50 lbs     OT LONG TERM GOAL #5   Title Pt will demonstrate improved functional mobility and balance as seen by supervision assist for simulated shower transfers   Baseline Moderate (06/27/16)   Time 8   Period Weeks   Status Achieved  08/17/16  Met per daughter      OT LONG TERM GOAL #6   Title Pt will perform simple snack/ cold meal prep and basic home management at a modified independent level demonstrating good safety awareness.   Time 8   Period Weeks   Status Partially Met  11/14/16:  supervision     OT LONG TERM GOAL #7   Title Pt / family will demonstrate understanding of updated HEP.   Time 8   Period Weeks   Status Achieved  11/14/16  met (HEP/recommendations for incr IADL participation)     OT LONG TERM GOAL #8   Title Pt will demonstrate improved standing balance and RUE endurance to perfom functional reaching activities mid-high range x 10 mins in standing without rest break or LOB.   Time 8   Period Weeks   Status Achieved  Pt stood x  15 mins               Plan - 11/16/16 1208    Clinical Impression  Statement Pt/ daughter agree with plans for d/c . Pt may benefit from additional occupational therapy in 4-5 months if pt's cognition/ awareness improves. Pt/ daughter to work on basic home management in the meantime and pt will continue to work with [place] yo address cognition.   Rehab Potential Fair   Clinical Impairments Affecting Rehab Potential severity of deficits; cognitive deficits   OT Frequency 2x / week   OT Duration 8 weeks   OT Treatment/Interventions Self-care/ADL training;Therapeutic exercise;DME and/or AE instruction;Therapeutic activities;Patient/family education;Balance training;Neuromuscular education;Energy conservation;Passive range of motion;Therapeutic exercises;Visual/perceptual remediation/compensation   Plan Discharge OT, pt may benefit from additional OT in 4-5 months, ptwill need new referral   OT Home Exercise Plan Education Issued:  cane and simple coordination HEP; recommendations for cutting with scissors, braiding, stringing beads   Consulted and Agree with Plan of Care Patient;Family member/caregiver   Family Member Consulted Daughter      Patient will benefit from skilled therapeutic intervention in order to improve the following deficits and impairments:  Decreased balance, Decreased endurance, Impaired vision/preception, Decreased range of motion, Decreased knowledge of precautions, Decreased cognition, Decreased activity tolerance, Decreased coordination, Decreased knowledge of use of DME, Decreased safety awareness, Decreased strength, Impaired flexibility, Impaired UE functional use, Decreased mobility, Difficulty walking, Impaired perceived functional ability, Abnormal gait, Impaired sensation  Visit Diagnosis: Visuospatial deficit  Other lack of coordination  Muscle weakness (generalized)  Cognitive social or emotional deficit following cerebral infarction   OCCUPATIONAL THERAPY DISCHARGE SUMMARY    Current functional level related to goals /  functional outcomes: Pt made good overall progress. See above for progress.   Remaining deficits: Cognitive deficits, decreased coordination, visual perceptual deficits, decreased balance, decreased strength   Education / Equipment: Pt was educated in : safety for ADLs / IADLs, HEP and functional activities to perform at home. Pt / dtr verbalize understanding of all education. Pt may benefit from additional OT in 4-5 months if awareness improves. Plan: Patient agrees to discharge.  Patient goals were partially met. Patient is being discharged due to meeting the stated rehab goals.  ?????      Problem List Patient Active Problem List   Diagnosis Date Noted  . Seizure-like activity ([place]) 06/28/2016  . Cerebral infarction due to unspecified mechanism   . Gait difficulty   . Memory loss 05/22/2016  . Confusion   . Acute ischemic stroke ([place]) 05/18/2016  . Morbid obesity ([place]) 09/30/2015  . Uncontrolled type 2 diabetes mellitus with circulatory disorder, without long-term current use of insulin ([place])   . Essential hypertension   . Cerebrovascular accident (CVA) due to thrombosis of cerebral artery ([place])   . CVA (cerebral infarction) 08/07/2015  . Pure hypercholesterolemia 10/04/2009    Lorelle Macaluso 11/16/2016, 1:02 PM Theone Murdoch, OTR/L [phone] Phone: [phone] 1:02 PM 02/01/18[facility] [address] [place], [name], [address] Phone: [phone]   Fax:  [phone]  Name: BINDU DOCTER MRN: [id number] Date of Birth: 1966/02/20

## 2016-11-16 NOTE — Patient Instructions (Signed)
  Please complete the assigned speech therapy homework and return it to your next session.  

## 2016-11-16 NOTE — Therapy (Signed)
[facility] [address] [place], [name], [address] Phone: [phone]   Fax:  [phone]  Speech Language Pathology Treatment  Patient Details  Name: Annette Munoz MRN: [id number] Date of Birth: 06-03-1966 Referring Provider: Dr. Lucianne Lei  Encounter Date: 11/16/2016      End of Session - 11/16/16 1619    Visit Number 24   Number of Visits 30   Date for SLP Re-Evaluation 12/15/16   SLP Start Time 0944  pt 14 minutes late   SLP Stop Time  1017   SLP Time Calculation (min) 33 min   Activity Tolerance Patient tolerated treatment well      Past Medical History:  Diagnosis Date  . Diabetes mellitus without complication ([place])   . Hypertension   . Stroke [facility])     Past Surgical History:  Procedure Laterality Date  . CESAREAN SECTION    . TEE WITHOUT CARDIOVERSION N/A 08/10/2015   Procedure: TRANSESOPHAGEAL ECHOCARDIOGRAM (TEE);  Surgeon: [name], MD;  Location: [facility] ENDOSCOPY;  Service: Cardiovascular;  Laterality: N/A;    There were no vitals filed for this visit.      Subjective Assessment - 11/16/16 0947    Subjective "My mama and daddy are renting an RV - can I use my handicapped sticker on that RV?" Daughter reminded pt that there was no handicapped sticker to use.   Currently in Pain? No/denies               ADULT SLP TREATMENT - 11/16/16 0947      General Information   Behavior/Cognition Alert;Cooperative;Pleasant mood     Treatment Provided   Treatment provided Cognitive-Linquistic     Cognitive-Linquistic Treatment   Treatment focused on Cognition;Patient/family/caregiver education   Skilled Treatment SLP facilitated pt use of memory compensations by asking pt questions she could only answer by looking in her binder. She req'd usual min cues to look in her binder throughout the session - repetition did not appear to incr the habit to look in her binder. Simple calendar task re:  therapy appointments req'd min-mod A usually faded to min A occasionally.     Assessment / Recommendations / Plan   Plan Continue with current plan of care     Progression Toward Goals   Progression toward goals Not progressing toward goals (comment)  pt cognition looks much like it did 3 weeks ago          SLP Education - 11/16/16 1619    Education provided Yes   Education Details home tasks after d/c ST   Person(s) Educated Patient;Child(ren)   Methods Explanation;Demonstration   Comprehension Verbalized understanding;Need further instruction  pt needs further instructions          SLP Short Term Goals - 11/16/16 1632      SLP SHORT TERM GOAL #1   Title Pt will utilize external aids/memory book to be oriented to date, appointments and daily chores with occasional min A over 3 sessions   Status Not Met     SLP SHORT TERM GOAL #2   Title Pt/family will report use of external aids/memory book at home for ADL's and medication mangement over 3 sessions   Time 1   Period Weeks   Status On-going     SLP SHORT TERM GOAL #3   Title Pt will verbalize 3 cognitive impairments with mod A   Status Achieved     SLP SHORT TERM GOAL #4   Title  Pt will attend to simple cognitive linguistic tasks in distracting enviroment for 10 mintues with occasional min A   Time 1   Period Weeks   Status On-going     SLP SHORT TERM GOAL #5   Title Pt will complete problemsolving/reasoning/thought organization tasks with 90% accuracy given min assist   Time 1   Period Weeks   Status On-going          SLP Long Term Goals - 11/16/16 1621      SLP LONG TERM GOAL #1   Title Pt/family will report use of external memory aids at home with rare min cues, for successful f/u on daily schedule, safety precautions, chores, meds, etc over 3 sessions.   Baseline 11-14-16   Time 1   Period Weeks   Status On-going     SLP LONG TERM GOAL #2   Title Pt will verbalize 3 safety precautions due to  cognitive impairments with mod A.    Time 1   Period Weeks   Status On-going     SLP LONG TERM GOAL #3   Title Pt will complete moderatley complex reasoning, organization, time and money problems with 75% accuracy and occasional min A   Time 1   Period Weeks   Status On-going     SLP LONG TERM GOAL #4   Title Pt will demonstrate emergent awareness by using memory compensation during therapy session x2 sessions   Time 1   Period Weeks   Status On-going          Plan - 11/16/16 1620    Clinical Impression Statement Pt continues to show deficits in cognitive linguistic areas, pt looks much like she did three weeks ago when this SLP last saw pt. Preparation began today for d/c in approx 1-2 weeks. STGs and LTGs will cont to be targeted until d/c in 1-2 weeks. For now, recommend continue skilled ST for 1-2 more weeks to provide caregiver/pt education for home tasks after ST d/c, to maximize cognition to reduce caregiver burden.   Speech Therapy Frequency 2x / week   Duration 2 weeks   Treatment/Interventions Compensatory strategies;Patient/family education;Functional tasks;Cueing hierarchy;Internal/external aids;Cognitive reorganization;SLP instruction and feedback   Potential to Achieve Goals Fair   Potential Considerations Severity of impairments;Previous level of function   Consulted and Agree with Plan of Care Patient;Family member/caregiver   Family Member Consulted daughter      Patient will benefit from skilled therapeutic intervention in order to improve the following deficits and impairments:   Cognitive communication deficit    Problem List Patient Active Problem List   Diagnosis Date Noted  . Seizure-like activity ([place]) 06/28/2016  . Cerebral infarction due to unspecified mechanism   . Gait difficulty   . Memory loss 05/22/2016  . Confusion   . Acute ischemic stroke ([place]) 05/18/2016  . Morbid obesity ([place]) 09/30/2015  . Uncontrolled type 2 diabetes mellitus with  circulatory disorder, without long-term current use of insulin ([place])   . Essential hypertension   . Cerebrovascular accident (CVA) due to thrombosis of cerebral artery ([place])   . CVA (cerebral infarction) 08/07/2015  . Pure hypercholesterolemia 10/04/2009    [facility] ,[place], [place]  11/16/2016, 4:34 PM  [facility] [address] [place], [name], [address] Phone: [phone]   Fax:  [phone]   Name: Annette Munoz MRN: [id number] Date of Birth: January 25, 1966

## 2016-11-17 ENCOUNTER — Encounter: Payer: Self-pay | Admitting: Physical Therapy

## 2016-11-17 NOTE — Therapy (Signed)
[facility] [address] [place], [name], [address] Phone: [phone]   Fax:  [phone]  Patient Details  Name: Annette Munoz MRN: [id number] Date of Birth: [birth year] Referring Provider:  No ref. provider found  Encounter Date: 11/17/2016   PHYSICAL THERAPY DISCHARGE SUMMARY  Visits from Start of Care: 29  Current functional level related to goals / functional outcomes:     PT Long Term Goals - 11/16/16 1023      PT LONG TERM GOAL #1   Title Pt/family will verbalize understanding of fall prevention within the home environment.  TARGET 08/25/16  UPDATED TARGET 11/29/16   Baseline 11/16/16: pt/daughter where able to state fall prevention strategies that have been provided in the past.   Time 8   Period Weeks   Status Achieved     PT LONG TERM GOAL #2   Title  UPDATED GOAL:  Pt will improved gait velocity to at least 1.8 ft/sec with cane for improved household mobility.  TARGET 2/14/187   Baseline 11/16/16: 19.97 sec's= 1.67 ft/sec with cane- not to goals, however improved from 1.53 ft/sec with cane   Time --   Period --   Status Partially Met     PT LONG TERM GOAL #3   Title UPDATED GOAL:  Improve TUG score to less than or equal to 15 seconds for decreased fall risk.  TARGET 11/29/16   Baseline 11/16/16: 22.84 sec's with cane today, goal not met however decreased time from last testing   Time --   Period --   Status Partially Met     PT LONG TERM GOAL #4   Title   UPDATED Goal:  DGI to be assessed and goal to be written as appropriate. (>12/24)  TARGET 11/29/16   Baseline DGI score 10/24   Status Not Met     PT LONG TERM GOAL #5   Title UPDATED GOAL:  ambulate at least 1000 ft indoor/outdoor surfaces with supervision.  TARGET 11/29/16   Baseline 11/16/16: met distance on indoor surfaces only with sueprvision with cane, outside not tested today due to cold weather and pt not wanting to go outside   Time --   Period  --   Status Partially Met     PT LONG TERM GOAL #6   Title Pt will negotiate at least 4 steps, 1 rail, with supervision of family, for improved safety with stair negotiation into/out of the home.  UPDATED TARGET 11/29/16   Baseline 11/16/16: met today   Status Achieved     Pt has met 2 of 6 LTGs, partially met several goals.  Pt has progressed from RW level to [facility] with gait, with supervision of family.  No recent falls reported   Remaining deficits: Decreased strength, decreased safety awareness   Education / Equipment: Educated patient and family on progressive HEP, fall prevention, safety with gait using cane.  Pt's daughter has been present for most sessions for education, with good carryover for home.  Plan: Patient agrees to discharge.  Patient goals were partially met. Patient is being discharged due to being pleased with the current functional level.  ?????      Abbigale Mcelhaney W. 11/17/2016, 10:35 AM Frazier Butt., PT [facility] [address] [place], [name], [address] Phone: [phone]   Fax:  [phone]

## 2016-11-20 ENCOUNTER — Other Ambulatory Visit: Payer: Self-pay | Admitting: Neurology

## 2016-11-20 DIAGNOSIS — I10 Essential (primary) hypertension: Secondary | ICD-10-CM

## 2016-11-20 DIAGNOSIS — E113293 Type 2 diabetes mellitus with mild nonproliferative diabetic retinopathy without macular edema, bilateral: Secondary | ICD-10-CM | POA: Diagnosis not present

## 2016-11-21 ENCOUNTER — Ambulatory Visit: Payer: Commercial Managed Care - HMO | Admitting: Speech Pathology

## 2016-11-21 ENCOUNTER — Ambulatory Visit: Payer: Commercial Managed Care - HMO | Admitting: Physical Therapy

## 2016-11-21 ENCOUNTER — Encounter: Payer: Commercial Managed Care - HMO | Admitting: Occupational Therapy

## 2016-11-21 DIAGNOSIS — R41842 Visuospatial deficit: Secondary | ICD-10-CM | POA: Diagnosis not present

## 2016-11-21 DIAGNOSIS — R41841 Cognitive communication deficit: Secondary | ICD-10-CM

## 2016-11-21 NOTE — Therapy (Signed)
[facility] [address] [place], [name], [address] Phone: [phone]   Fax:  [phone]  Speech Language Pathology Treatment  Patient Details  Name: Annette Munoz MRN: [id number] Date of Birth: May 21, 1966 Referring Provider: Dr. Lucianne Lei  Encounter Date: 11/21/2016      End of Session - 11/21/16 1432    Visit Number 25   Number of Visits 30   Date for SLP Re-Evaluation 12/15/16   SLP Start Time 45   SLP Stop Time  1101   SLP Time Calculation (min) 42 min      Past Medical History:  Diagnosis Date  . Diabetes mellitus without complication ([place])   . Hypertension   . Stroke [facility])     Past Surgical History:  Procedure Laterality Date  . CESAREAN SECTION    . TEE WITHOUT CARDIOVERSION N/A 08/10/2015   Procedure: TRANSESOPHAGEAL ECHOCARDIOGRAM (TEE);  Surgeon: [name], MD;  Location: [facility] ENDOSCOPY;  Service: Cardiovascular;  Laterality: N/A;    There were no vitals filed for this visit.      Subjective Assessment - 11/21/16 1205    Subjective "She needs encouragement to keep going - like she seasons a roast for dinner then doesn't want to do anymore"   Patient is accompained by: Family member   Special Tests daughter               ADULT SLP TREATMENT - 11/21/16 [place]   Behavior/Cognition Alert;Cooperative;Pleasant mood     Treatment Provided   Treatment provided Cognitive-Linquistic     Cognitive-Linquistic Treatment   Treatment focused on Cognition;Patient/family/caregiver education   Skilled Treatment D/w with pt and daughter to talk to neurologist about meds for attention or depression, as pt has difficulty completing chores, requiring encouragement to complete chores/tasks at home. Pt quite flat today, with limited verbalizations. Facilitated organization, functional math and alternating attention/attention to detail given a grocery budget, detailed list  and price list for pt to stay in budget with occasional to usual mod A to attend to details in list ( ie: 1/2 gallpon vs 1 gallon milk) and to alternate her attention from her list to the price list. Pt to correct list to come out on budget for homework     Assessment / Recommendations / [place] with current plan of care     Progression Toward Goals   Progression toward goals Not progressing toward goals (comment)  continues to require cues to look in her notebook for HW/apt          SLP Education - 11/21/16 1210    Education provided Yes   Education Details d/w neurologist meds for depression/attention   Person(s) Educated Patient;Child(ren)   Methods Explanation;Demonstration   Comprehension Verbalized understanding;Verbal cues required          SLP Short Term Goals - 11/21/16 1432      SLP SHORT TERM GOAL #1   Title Pt will utilize external aids/memory book to be oriented to date, appointments and daily chores with occasional min A over 3 sessions   Status Not Met     SLP SHORT TERM GOAL #2   Title Pt/family will report use of external aids/memory book at home for ADL's and medication mangement over 3 sessions   Time 1   Period Weeks   Status On-going     SLP SHORT TERM GOAL #3   Title Pt will verbalize 3  cognitive impairments with mod A   Status Achieved     SLP SHORT TERM GOAL #4   Title Pt will attend to simple cognitive linguistic tasks in distracting enviroment for 10 mintues with occasional min A   Time 1   Period Weeks   Status On-going     SLP SHORT TERM GOAL #5   Title Pt will complete problemsolving/reasoning/thought organization tasks with 90% accuracy given min assist   Time 1   Period Weeks   Status On-going          SLP Long Term Goals - 11/21/16 1432      SLP LONG TERM GOAL #1   Title Pt/family will report use of external memory aids at home with rare min cues, for successful f/u on daily schedule, safety precautions, chores,  meds, etc over 3 sessions.   Baseline 11-14-16; 11/21/16   Time 1   Period Weeks   Status On-going     SLP LONG TERM GOAL #2   Title Pt will verbalize 3 safety precautions due to cognitive impairments with mod A.    Time 1   Period Weeks   Status On-going     SLP LONG TERM GOAL #3   Title Pt will complete moderatley complex reasoning, organization, time and money problems with 75% accuracy and occasional min A   Time 1   Period Weeks   Status On-going     SLP LONG TERM GOAL #4   Title Pt will demonstrate emergent awareness by using memory compensation during therapy session x2 sessions   Time 1   Period Weeks   Status On-going          Plan - 11/21/16 1431    Clinical Impression Statement Pt continues to show deficits in cognitive linguistic areas, pt looks much like she did three weeks ago when this SLP last saw pt. Preparation began today for d/c in approx 1-2 weeks. STGs and LTGs will cont to be targeted until d/c in 1-2 weeks. For now, recommend continue skilled ST for 1-2 more weeks to provide caregiver/pt education for home tasks after ST d/c, to maximize cognition to reduce caregiver burden.   Speech Therapy Frequency 2x / week   Duration 2 weeks   Treatment/Interventions Compensatory strategies;Patient/family education;Functional tasks;Cueing hierarchy;Internal/external aids;Cognitive reorganization;SLP instruction and feedback   Potential to Achieve Goals Fair   Potential Considerations Severity of impairments;Previous level of function   Consulted and Agree with Plan of Care Patient;Family member/caregiver      Patient will benefit from skilled therapeutic intervention in order to improve the following deficits and impairments:   Cognitive communication deficit    Problem List Patient Active Problem List   Diagnosis Date Noted  . Seizure-like activity (HCC) 06/28/2016  . Cerebral infarction due to unspecified mechanism   . Gait difficulty   . Memory loss  05/22/2016  . Confusion   . Acute ischemic stroke (HCC) 05/18/2016  . Morbid obesity (HCC) 09/30/2015  . Uncontrolled type 2 diabetes mellitus with circulatory disorder, without long-term current use of insulin (HCC)   . Essential hypertension   . Cerebrovascular accident (CVA) due to thrombosis of cerebral artery (HCC)   . CVA (cerebral infarction) 08/07/2015  . Pure hypercholesterolemia 10/04/2009    [place]anier Coach, [name] MS, CCC-SLP 11/21/2016, 2:33 PM  [facility] [address] [place], [place], [address] Phone: [phone]   Fax:  [phone]   Name: Annette Munoz MRN: [id number] Date of Birth:  03/07/1966 

## 2016-11-23 ENCOUNTER — Encounter: Payer: Commercial Managed Care - HMO | Admitting: Occupational Therapy

## 2016-11-23 ENCOUNTER — Ambulatory Visit: Payer: Commercial Managed Care - HMO | Admitting: Physical Therapy

## 2016-11-23 ENCOUNTER — Ambulatory Visit: Payer: Commercial Managed Care - HMO | Admitting: *Deleted

## 2016-11-23 DIAGNOSIS — Z0271 Encounter for disability determination: Secondary | ICD-10-CM

## 2016-11-23 DIAGNOSIS — R41841 Cognitive communication deficit: Secondary | ICD-10-CM

## 2016-11-23 DIAGNOSIS — R41842 Visuospatial deficit: Secondary | ICD-10-CM | POA: Diagnosis not present

## 2016-11-23 NOTE — Therapy (Signed)
[facility] [address] [place], [name], [address] Phone: [phone]   Fax:  [phone]  Speech Language Pathology Treatment  Patient Details  Name: Annette Munoz MRN: [id number] Date of Birth: Jan 21, 1966 Referring Provider: Dr. Lucianne Lei  Encounter Date: 11/23/2016      End of Session - 11/23/16 1204    Visit Number 26   Number of Visits 30   Date for SLP Re-Evaluation 12/15/16   SLP Start Time 1025   SLP Stop Time  1100   SLP Time Calculation (min) 35 min   Activity Tolerance Patient tolerated treatment well      Past Medical History:  Diagnosis Date  . Diabetes mellitus without complication ([place])   . Hypertension   . Stroke [facility])     Past Surgical History:  Procedure Laterality Date  . CESAREAN SECTION    . TEE WITHOUT CARDIOVERSION N/A 08/10/2015   Procedure: TRANSESOPHAGEAL ECHOCARDIOGRAM (TEE);  Surgeon: [name], MD;  Location: [facility] ENDOSCOPY;  Service: Cardiovascular;  Laterality: N/A;    There were no vitals filed for this visit.      Subjective Assessment - 11/23/16 1028    Subjective We have an appointment with her primary MD tomorrow - Daughter questioned medication for attention/focus - does not want pt on Ritalyn.   Patient is accompained by: Family member  daughter   Currently in Pain? No/denies               ADULT SLP TREATMENT - 11/23/16 0001      General Information   Behavior/Cognition Alert;Cooperative;Pleasant mood     Treatment Provided   Treatment provided Cognitive-Linquistic     Cognitive-Linquistic Treatment   Treatment focused on Cognition;Patient/family/caregiver education   Skilled Treatment The Montreal Cognitive Assessment (MoCA) was administered today for diagnostic treatment. Last administration of MoCA, pt scored 15/30. Todays assessment revealed improvement to 23/30 (n=26+/30). Points were lost on the following subtests: verbal fluency,  orientation, calculation, and immediate and delayed recall. Pt completed attention to detail task with 82% accuracy, increasing to 100% with mod cues.     Assessment / Recommendations / Plan   Plan Continue with current plan of care     Progression Toward Goals   Progression toward goals Not progressing toward goals (comment)  overall improvement based on MoCA score          SLP Education - 11/23/16 1204    Education provided Yes   Education Details following written directions worksheet for attention to detail   Person(s) Educated Patient   Methods Explanation;Demonstration;Handout   Comprehension Verbalized understanding;Verbal cues required          SLP Short Term Goals - 11/23/16 1207      SLP SHORT TERM GOAL #1   Title Pt will utilize external aids/memory book to be oriented to date, appointments and daily chores with occasional min A over 3 sessions   Status Not Met     SLP SHORT TERM GOAL #2   Title Pt/family will report use of external aids/memory book at home for ADL's and medication mangement over 3 sessions   Time 1   Period Weeks   Status On-going     SLP SHORT TERM GOAL #3   Title Pt will verbalize 3 cognitive impairments with mod A   Status Achieved     SLP SHORT TERM GOAL #4   Title Pt will attend to simple cognitive linguistic tasks in distracting enviroment for 10 mintues  with occasional min A   Period Weeks   Status On-going     SLP SHORT TERM GOAL #5   Title Pt will complete problemsolving/reasoning/thought organization tasks with 90% accuracy given min assist   Period Weeks   Status On-going          SLP Long Term Goals - 11/23/16 1208      SLP LONG TERM GOAL #1   Title Pt/family will report use of external memory aids at home with rare min cues, for successful f/u on daily schedule, safety precautions, chores, meds, etc over 3 sessions.   Baseline 11-14-16; 11/21/16   Time 1   Period Weeks   Status On-going     SLP LONG TERM GOAL #2    Title Pt will verbalize 3 safety precautions due to cognitive impairments with mod A.    Time 1   Period Weeks   Status On-going     SLP LONG TERM GOAL #3   Title Pt will complete moderatley complex reasoning, organization, time and money problems with 75% accuracy and occasional min A   Time 1   Period Weeks   Status On-going     SLP LONG TERM GOAL #4   Title Pt will demonstrate emergent awareness by using memory compensation during therapy session x2 sessions   Baseline 08/15/16   Time 1   Period Weeks   Status On-going          Plan - 11/23/16 1205    Clinical Impression Statement Improvement in MoCA score to 23/30 today, but continued difficulty with functional use of compensatory strategies for recall. Pt's daughter seemed surprised that therapy would be for 4 more sessions. Will continue skilled ST for 1-2 more weeks for continued education regarding strategies to use at home to maximize pt function safety and independence and decrease caregiver burden.   Speech Therapy Frequency 2x / week   Duration 2 weeks   Treatment/Interventions Compensatory strategies;Patient/family education;Functional tasks;Cueing hierarchy;Internal/external aids;Cognitive reorganization;SLP instruction and feedback   Potential to Achieve Goals Fair   Potential Considerations Severity of impairments;Previous level of function;Ability to learn/carryover information;Cooperation/participation level;Family/community support   SLP Home Exercise Plan reviewed with pt/dtr   Consulted and Agree with Plan of Care Patient;Family member/caregiver   Family Member Consulted daughter      Patient will benefit from skilled therapeutic intervention in order to improve the following deficits and impairments:   Cognitive communication deficit    Problem List Patient Active Problem List   Diagnosis Date Noted  . Seizure-like activity ([place]) 06/28/2016  . Cerebral infarction due to unspecified mechanism   . Gait  difficulty   . Memory loss 05/22/2016  . Confusion   . Acute ischemic stroke ([place]) 05/18/2016  . Morbid obesity ([place]) 09/30/2015  . Uncontrolled type 2 diabetes mellitus with circulatory disorder, without long-term current use of insulin ([place])   . Essential hypertension   . Cerebrovascular accident (CVA) due to thrombosis of cerebral artery ([place])   . CVA (cerebral infarction) 08/07/2015  . Pure hypercholesterolemia 10/04/2009   Celia B. [place], MSP, CCC-SLP  Shonna Chock 11/23/2016, 12:10 PM  [facility] [address] [place], [name], [address] Phone: [phone]   Fax:  [phone]   Name: Annette Munoz MRN: [id number] Date of Birth: 1966/06/30

## 2016-11-24 DIAGNOSIS — E1122 Type 2 diabetes mellitus with diabetic chronic kidney disease: Secondary | ICD-10-CM | POA: Diagnosis not present

## 2016-11-24 DIAGNOSIS — I119 Hypertensive heart disease without heart failure: Secondary | ICD-10-CM | POA: Diagnosis not present

## 2016-11-24 DIAGNOSIS — I639 Cerebral infarction, unspecified: Secondary | ICD-10-CM | POA: Diagnosis not present

## 2016-11-25 ENCOUNTER — Emergency Department (HOSPITAL_COMMUNITY): Payer: Commercial Managed Care - HMO

## 2016-11-25 ENCOUNTER — Emergency Department (HOSPITAL_COMMUNITY)
Admission: EM | Admit: 2016-11-25 | Discharge: 2016-11-25 | Disposition: A | Discharge status: Home / Self Care | Payer: Commercial Managed Care - HMO | Attending: Emergency Medicine | Admitting: Emergency Medicine

## 2016-11-25 ENCOUNTER — Encounter (HOSPITAL_COMMUNITY): Payer: Self-pay | Admitting: *Deleted

## 2016-11-25 DIAGNOSIS — I10 Essential (primary) hypertension: Secondary | ICD-10-CM | POA: Insufficient documentation

## 2016-11-25 DIAGNOSIS — Z79899 Other long term (current) drug therapy: Secondary | ICD-10-CM | POA: Insufficient documentation

## 2016-11-25 DIAGNOSIS — W19XXXA Unspecified fall, initial encounter: Secondary | ICD-10-CM | POA: Diagnosis not present

## 2016-11-25 DIAGNOSIS — E1159 Type 2 diabetes mellitus with other circulatory complications: Secondary | ICD-10-CM | POA: Diagnosis not present

## 2016-11-25 DIAGNOSIS — Y999 Unspecified external cause status: Secondary | ICD-10-CM | POA: Diagnosis not present

## 2016-11-25 DIAGNOSIS — Z7982 Long term (current) use of aspirin: Secondary | ICD-10-CM | POA: Diagnosis not present

## 2016-11-25 DIAGNOSIS — Z794 Long term (current) use of insulin: Secondary | ICD-10-CM | POA: Diagnosis not present

## 2016-11-25 DIAGNOSIS — M25561 Pain in right knee: Secondary | ICD-10-CM | POA: Diagnosis not present

## 2016-11-25 DIAGNOSIS — M25551 Pain in right hip: Secondary | ICD-10-CM | POA: Diagnosis not present

## 2016-11-25 DIAGNOSIS — Y92009 Unspecified place in unspecified non-institutional (private) residence as the place of occurrence of the external cause: Secondary | ICD-10-CM | POA: Insufficient documentation

## 2016-11-25 DIAGNOSIS — S79911A Unspecified injury of right hip, initial encounter: Secondary | ICD-10-CM | POA: Diagnosis not present

## 2016-11-25 DIAGNOSIS — S7001XA Contusion of right hip, initial encounter: Secondary | ICD-10-CM | POA: Insufficient documentation

## 2016-11-25 DIAGNOSIS — Z8673 Personal history of transient ischemic attack (TIA), and cerebral infarction without residual deficits: Secondary | ICD-10-CM | POA: Diagnosis not present

## 2016-11-25 DIAGNOSIS — Y939 Activity, unspecified: Secondary | ICD-10-CM | POA: Insufficient documentation

## 2016-11-25 MED ORDER — ONDANSETRON HCL 4 MG/2ML IJ SOLN
4.0000 mg | Freq: Once | INTRAMUSCULAR | Status: AC
Start: 2016-11-25 — End: 2016-11-25
  Administered 2016-11-25: 4 mg via INTRAVENOUS
  Filled 2016-11-25: qty 2

## 2016-11-25 MED ORDER — HYDROMORPHONE HCL 2 MG/ML IJ SOLN
1.0000 mg | Freq: Once | INTRAMUSCULAR | Status: AC
  Administered 2016-11-25: 1 mg via INTRAVENOUS
  Filled 2016-11-25: qty 1

## 2016-11-25 MED ORDER — PROMETHAZINE HCL 25 MG/ML IJ SOLN
12.5000 mg | Freq: Once | INTRAMUSCULAR | Status: AC
  Administered 2016-11-25: 12.5 mg via INTRAVENOUS
  Filled 2016-11-25: qty 1

## 2016-11-25 MED ORDER — ONDANSETRON 4 MG PO TBDP
4.0000 mg | ORAL_TABLET | Freq: Once | ORAL | Status: AC
Start: 2016-11-25 — End: 2016-11-25
  Administered 2016-11-25: 4 mg via ORAL
  Filled 2016-11-25: qty 1

## 2016-11-25 NOTE — ED Notes (Signed)
Pt ambulated with walker, no problems noted. Pt's daughter advised the pt could not even put weight on her foot prior to coming into the ED. Pt did not have any grimace or pain noted. Pt stated she felt fine walking around.

## 2016-11-25 NOTE — ED Provider Notes (Signed)
[facility] DEPT Provider Note   CSN: [id number] Arrival date & time: 11/25/16  0002 By signing my name below, I, Annette Munoz, attest that this documentation has been prepared under the direction and in the presence of Annette Gambler, MD . Electronically Signed: Dyke Munoz, Scribe. 11/25/2016. 3:09 AM.   History   Chief Complaint Chief Complaint  Patient presents with  . Fall   HPI Annette Munoz is a 51 y.o. female with a history of DM, HTN and stroke who presents to the Emergency Department complaining of sudden onset, constant right hip pain s/p mechanical fall PTA. Triage note indicates that pt's feet "got tangled up", causing her to fall.Per pt's daughter, pt's prior stroke left her with deficits in her right side and is typically unstable on her feet.  Pt has been ambulatory since the fall, but reports increased pain with movement. She currently rates her pain 10/10 and states pain is exacerbated by bearing weight. No medications taken PTA. No alleviating factors noted. No recent illness. She denies LOC or head injury. Pt has no other complaints or symptoms at this time.   The history is provided by the patient. No language interpreter was used.    Past Medical History:  Diagnosis Date  . Diabetes mellitus without complication ([place])   . Hypertension   . Stroke [facility])    Patient Active Problem List   Diagnosis Date Noted  . Seizure-like activity ([place]) 06/28/2016  . Cerebral infarction due to unspecified mechanism   . Gait difficulty   . Memory loss 05/22/2016  . Confusion   . Acute ischemic stroke ([place]) 05/18/2016  . Morbid obesity ([place]) 09/30/2015  . Uncontrolled type 2 diabetes mellitus with circulatory disorder, without long-term current use of insulin ([place])   . Essential hypertension   . Cerebrovascular accident (CVA) due to thrombosis of cerebral artery ([place])   . CVA (cerebral infarction) 08/07/2015  . Pure hypercholesterolemia 10/04/2009    Past Surgical  History:  Procedure Laterality Date  . CESAREAN SECTION    . TEE WITHOUT CARDIOVERSION N/A 08/10/2015   Procedure: TRANSESOPHAGEAL ECHOCARDIOGRAM (TEE);  Surgeon: [name], MD;  Location: [facility] ENDOSCOPY;  Service: Cardiovascular;  Laterality: N/A;    OB History    No data available       Home Medications    Prior to Admission medications   Medication Sig Start Date End Date Taking? Authorizing Provider  amLODipine (NORVASC) 10 MG tablet Take 1 tablet (10 mg total) by mouth daily. 05/23/16  Yes Rosalin Hawking, MD  aspirin 325 MG tablet Take 1 tablet (325 mg total) by mouth daily. 12/20/15  Yes Rosalin Hawking, MD  atorvastatin (LIPITOR) 80 MG tablet TAKE 1 TABLET (80 MG TOTAL) BY MOUTH DAILY AT 6 PM. 12/20/15  Yes Rosalin Hawking, MD  clopidogrel (PLAVIX) 75 MG tablet Take 1 tablet (75 mg total) by mouth daily. 05/21/16  Yes Velvet Bathe, MD  donepezil (ARICEPT) 10 MG tablet Take 1 tablet (10 mg total) by mouth at bedtime. 06/27/16  Yes Rosalin Hawking, MD  metFORMIN (GLUCOPHAGE-XR) 500 MG 24 hr tablet TAKE 4 TABLETS (2,000 MG TOTAL) BY MOUTH DAILY WITH BREAKFAST. 10/26/16  Yes Mancel Bale, PA-C  valsartan-hydrochlorothiazide (DIOVAN-HCT) 320-12.5 MG tablet Take 1 tablet by mouth daily.   Yes Historical Provider, MD  Alcohol Swabs (ALCOHOL PREP) PADS Use to check blood sugar daily. E11.65 09/16/15   Mancel Bale, PA-C  blood glucose meter kit and supplies KIT Use daily to check blood sugar.  E11.65 09/16/15   Mancel Bale, PA-C  Blood Pressure Monitoring (PREMIUM AUTOMATIC BP MONITOR) DEVI 1 Device by Does not apply route daily. 08/17/15   Mancel Bale, PA-C  glucose blood test strip Use to check blood sugar daily. E11.65 09/16/15   Mancel Bale, PA-C  Insulin Syringe-Needle U-100 25G X 1" 1 ML MISC For 4 times a day insulin SQ, 1 month supply. Diagnosis E11.65 06/21/16   Thurnell Lose, MD  Lancet Devices (LANCING DEVICE) MISC Use daily to check blood sugar. E11.65 09/16/15   Mancel Bale, PA-C  Lancets MISC  Use to check blood sugar daily. E11.65 09/16/15   Mancel Bale, PA-C    Family History Family History  Problem Relation Age of Onset  . Diabetes Mother   . Heart disease Mother   . Hypertension Mother   . Hypertension Father   . Stroke Father   . Stroke Maternal Grandmother     Social History Social History  Substance Use Topics  . Smoking status: Never Smoker  . Smokeless tobacco: Never Used  . Alcohol use No     Comment: occasionally     Allergies   Glyxambi [empagliflozin-linagliptin] and Lisinopril   Review of Systems Review of Systems  Musculoskeletal: Positive for arthralgias and myalgias.  Neurological: Negative for syncope.  All other systems reviewed and are negative.  Physical Exam Updated Vital Signs BP 128/73 (BP Location: Left Arm)   Pulse 76   Temp 98.5 F (36.9 C) (Oral)   Resp 18   Ht _0  (1.575 m)   Wt 186 lb (84.4 kg)   SpO2 100%   BMI 34.02 kg/m   Physical Exam  Constitutional: She is oriented to person, place, and time. She appears well-developed and well-nourished.  HENT:  Head: Normocephalic and atraumatic.  Right Ear: External ear normal.  Left Ear: External ear normal.  Nose: Nose normal.  Eyes: Right eye exhibits no discharge. Left eye exhibits no discharge.  Cardiovascular: Normal rate and regular rhythm.   2+ DP pulse right  Pulmonary/Chest: Effort normal and breath sounds normal.  Abdominal: Soft. There is no tenderness.  Musculoskeletal: She exhibits tenderness.  Right lateral hip pain with ROM of hip, no significant  Point tenderness. Mild right knee swelling with no tenderness. No thigh or lower leg tenderness or swelling.   Neurological: She is alert and oriented to person, place, and time.  Skin: Skin is warm and dry.  Nursing note and vitals reviewed.  ED Treatments / Results  DIAGNOSTIC STUDIES:  Oxygen Saturation is 100% on RA, normal by my interpretation.    COORDINATION OF CARE:  3:01 AM Discussed  treatment plan with pt at bedside and pt agreed to plan.   Labs (all labs ordered are listed, but only abnormal results are displayed) Labs Reviewed - No data to display  EKG  EKG Interpretation None       Radiology Dg Knee Complete 4 Views Right  Result Date: 11/25/2016 CLINICAL DATA:  Right hip and right knee pain after fall earlier today. EXAM: RIGHT KNEE - COMPLETE 4+ VIEW COMPARISON:  06/23/2016 FINDINGS: No evidence of fracture, dislocation, or joint effusion. No evidence of arthropathy or other focal bone abnormality. Soft tissues are unremarkable. IMPRESSION: Negative. Electronically Signed   By: Lucienne Capers M.D.   On: 11/25/2016 04:34   Dg Hip Unilat With Pelvis 2-3 Views Right  Result Date: 11/25/2016 CLINICAL DATA:  Right hip and right knee pain after a  fall earlier today. EXAM: DG HIP (WITH OR WITHOUT PELVIS) 2-3V RIGHT COMPARISON:  None. FINDINGS: There is no evidence of hip fracture or dislocation. There is no evidence of arthropathy or other focal bone abnormality. IMPRESSION: Negative. Electronically Signed   By: Lucienne Capers M.D.   On: 11/25/2016 04:33    Procedures Procedures (including critical care time)  Medications Ordered in ED Medications  HYDROmorphone (DILAUDID) injection 1 mg (1 mg Intravenous Given 11/25/16 0331)  ondansetron (ZOFRAN-ODT) disintegrating tablet 4 mg (4 mg Oral Given 11/25/16 0425)  ondansetron (ZOFRAN) injection 4 mg (4 mg Intravenous Given 11/25/16 0506)  promethazine (PHENERGAN) injection 12.5 mg (12.5 mg Intravenous Given 11/25/16 0622)    Initial Impression / Assessment and Plan / ED Course  I have reviewed the triage vital signs and the nursing notes.  Pertinent labs & imaging results that were available during my care of the patient were reviewed by me and considered in my medical decision making (see chart for details).     Patient has frequent balance troubles and has what appears to be a mechanical fall. No  dizziness/near-syncope/syncope. Neurologically intact. Pain better after dilaudid but now having vomiting. Finally controlled with phenergan. Now able to ambulate with walker (normally needs walker) and bears weight without difficulty. Highly doubt occult hip fracture. Pain now controlled. D/c home with return precautions.  Final Clinical Impressions(s) / ED Diagnoses   Final diagnoses:  Fall, initial encounter  Contusion of right hip, initial encounter    New Prescriptions Discharge Medication List as of 11/25/2016  6:54 AM     I personally performed the services described in this documentation, which was scribed in my presence. The recorded information has been reviewed and is accurate.    Annette Gambler, MD 11/25/16 [phone]

## 2016-11-25 NOTE — ED Notes (Signed)
Patient transported to X-ray 

## 2016-11-25 NOTE — ED Notes (Signed)
Pt nauseated and vomiting again.

## 2016-11-25 NOTE — ED Triage Notes (Signed)
The pt is c/o rt hip and rt knee pain/  She fell earlier today at home .  She had a stroke in the past that has affected her rt side and she  Got her foot tangled up causing her to fall.  n pain unless she moves

## 2016-11-27 ENCOUNTER — Other Ambulatory Visit: Payer: Self-pay | Admitting: Physician Assistant

## 2016-11-28 ENCOUNTER — Encounter: Payer: Commercial Managed Care - HMO | Admitting: Occupational Therapy

## 2016-11-28 ENCOUNTER — Ambulatory Visit: Payer: Commercial Managed Care - HMO | Admitting: *Deleted

## 2016-11-28 ENCOUNTER — Ambulatory Visit: Payer: Commercial Managed Care - HMO | Admitting: Physical Therapy

## 2016-11-28 DIAGNOSIS — E119 Type 2 diabetes mellitus without complications: Secondary | ICD-10-CM | POA: Diagnosis not present

## 2016-11-28 DIAGNOSIS — Z8673 Personal history of transient ischemic attack (TIA), and cerebral infarction without residual deficits: Secondary | ICD-10-CM | POA: Diagnosis not present

## 2016-11-28 DIAGNOSIS — R41842 Visuospatial deficit: Secondary | ICD-10-CM | POA: Diagnosis not present

## 2016-11-28 DIAGNOSIS — R41841 Cognitive communication deficit: Secondary | ICD-10-CM

## 2016-11-28 NOTE — Telephone Encounter (Signed)
Patient has not been seen at our clinic in more than 1 year. I will send a script for 30 day refill only. She is to rtc for additional refills. Left voice message for patient.

## 2016-11-28 NOTE — Therapy (Signed)
Annette Munoz [address] [place], [name], [address] Phone: [phone]   Fax:  [phone]  Speech Language Pathology Treatment  Patient Details  Name: Annette Munoz MRN: [id number] Date of Birth: [birth year] Referring Provider: Dr. Lucianne Lei  Encounter Date: 11/28/2016      End of Session - 11/28/16 1215    Visit Number 27   Number of Visits 30   Date for SLP Re-Evaluation 12/15/16   SLP Start Time 0955   SLP Stop Time  1015   SLP Time Calculation (min) 20 min   Activity Tolerance Patient tolerated treatment well      Past Medical History:  Diagnosis Date  . Diabetes mellitus without complication ([place])   . Hypertension   . Stroke [facility])     Past Surgical History:  Procedure Laterality Date  . CESAREAN SECTION    . TEE WITHOUT CARDIOVERSION N/A 08/10/2015   Procedure: TRANSESOPHAGEAL ECHOCARDIOGRAM (TEE);  Surgeon: [name], MD;  Location: [facility] ENDOSCOPY;  Service: Cardiovascular;  Laterality: N/A;    There were no vitals filed for this visit.      Subjective Assessment - 11/28/16 0959    Subjective Pt walked in with walker today. Daughter reports pt fell Friday. Going to MD after therapy today.   Patient is accompained by: Family member  daughter   Currently in Pain? Yes   Pain Score 4    Pain Location Hip   Pain Orientation Right   Pain Descriptors / Indicators Aching   Pain Type Acute pain   Pain Frequency Intermittent  with movement or when waking   Aggravating Factors  walking   Pain Relieving Factors being still   Effect of Pain on Daily Activities decrease in ADLS (washing dishes, etc), since fall   Multiple Pain Sites No               ADULT SLP TREATMENT - 11/28/16 0001      General Information   Behavior/Cognition Alert;Cooperative;Pleasant mood     Treatment Provided   Treatment provided Cognitive-Linquistic     Pain Assessment   Pain Assessment 0-10   Pain  Score 4    Pain Location --  right hip   Pain Descriptors / Indicators Aching   Pain Intervention(s) Monitored during session     Cognitive-Linquistic Treatment   Treatment focused on Cognition;Patient/family/caregiver education   Skilled Treatment Session was shortened today due to pt being 20 minutes late. Pt seen for skilled ST session targeting education on cognitive activities and computer programs/apps to complete at home following DC from skilled ST. Written list of suggestions was provided to pt/daughter. Pt's daughter reports pt increasing in independence with taking blood sugar and blood pressure meds, and daughter only supervises. Pt was encouraged to keep notebook simple and organized to facilitate reference at home. Pt required mod-max cues to identify possible meal plan for the upcoming week. Mod cues required for pt to refer to notebook for information, exercises, meal plans     Assessment / Recommendations / Plan   Plan Continue with current plan of care     Progression Toward Goals   Progression toward goals Not progressing toward goals (comment)  pt using notebook at home but requires cues to do so.          SLP Education - 11/28/16 1215    Education provided Yes   Education Details look at information on cognitive activities at home and on computer - come  with questions next session   Person(s) Educated Patient;Child(ren)   Methods Demonstration;Explanation;Handout   Comprehension Verbalized understanding;Need further instruction;Verbal cues required          SLP Short Term Goals - 11/28/16 1218      SLP SHORT TERM GOAL #1   Title Pt will utilize external aids/memory book to be oriented to date, appointments and daily chores with occasional min A over 3 sessions   Period Weeks   Status Not Met     SLP SHORT TERM GOAL #2   Title Pt/family will report use of external aids/memory book at home for ADL's and medication mangement over 3 sessions   Baseline  07-21-16, 07-28-16, 11/28/16 with cues/supervision   Time 0   Period Weeks   Status Partially Met     SLP SHORT TERM GOAL #3   Title Pt will verbalize 3 cognitive impairments with mod A   Status Achieved     SLP SHORT TERM GOAL #4   Title Pt will attend to simple cognitive linguistic tasks in distracting enviroment for 10 mintues with occasional min A   Time 0   Period Weeks   Status Partially Met     SLP SHORT TERM GOAL #5   Title Pt will complete problemsolving/reasoning/thought organization tasks with 90% accuracy given min assist   Time 0   Period Weeks   Status Partially Met          SLP Long Term Goals - 11/28/16 1220      SLP LONG TERM GOAL #1   Title Pt/family will report use of external memory aids at home with rare min cues, for successful f/u on daily schedule, safety precautions, chores, meds, etc over 3 sessions.   Baseline 11-14-16; 11/21/16, 11/28/16 - pt requires more than rare min cues and supervision to refer to notebook   Time 0   Period Weeks   Status Partially Met     SLP LONG TERM GOAL #2   Title Pt will verbalize 3 safety precautions due to cognitive impairments with mod A.    Status Achieved     SLP LONG TERM GOAL #3   Title Pt will complete moderatley complex reasoning, organization, time and money problems with 75% accuracy and occasional min A   Time 0   Period Weeks   Status Partially Met     SLP LONG TERM GOAL #4   Title Pt will demonstrate emergent awareness by using memory compensation during therapy session x2 sessions   Baseline 08/15/16, 11/28/16 with cues   Status Achieved          Plan - 11/28/16 1216    Clinical Impression Statement Pt had a fall on Friday, and was walking with her walker today. Daughter indicated they were going to the doctor after therapy. Daughter reports pt increasing independence with taking blood sugar/pressure test/meds with supervision. SLP encouraged use of cognitive activities and apps to continue exercises  for cognitive skills and keeping notebook neat and organized for easy reference following DC from skilled ST.   Speech Therapy Frequency 2x / week   Duration 2 weeks   Treatment/Interventions Compensatory strategies;Patient/family education;Functional tasks;Cueing hierarchy;Internal/external aids;Cognitive reorganization;SLP instruction and feedback   Potential to Achieve Goals Fair   Potential Considerations Severity of impairments;Previous level of function;Ability to learn/carryover information;Cooperation/participation level;Family/community support   SLP Home Exercise Plan reviewed with pt/dtr   Consulted and Agree with Plan of Care Patient;Family member/caregiver   Family Member Consulted daughter  Patient will benefit from skilled therapeutic intervention in order to improve the following deficits and impairments:   Cognitive communication deficit    Problem List Patient Active Problem List   Diagnosis Date Noted  . Seizure-like activity ([place]) 06/28/2016  . Cerebral infarction due to unspecified mechanism   . Gait difficulty   . Memory loss 05/22/2016  . Confusion   . Acute ischemic stroke ([place]) 05/18/2016  . Morbid obesity ([place]) 09/30/2015  . Uncontrolled type 2 diabetes mellitus with circulatory disorder, without long-term current use of insulin ([place])   . Essential hypertension   . Cerebrovascular accident (CVA) due to thrombosis of cerebral artery ([place])   . CVA (cerebral infarction) 08/07/2015  . Pure hypercholesterolemia 10/04/2009    B. [place], MSP, CCC-SLP  Shonna Chock 11/28/2016, 12:23 PM  [facility] [address] [place], [name], [address] Phone: [phone]   Fax:  [phone]   Name: Annette Munoz MRN: [id number] Date of Birth: [birth year]

## 2016-11-30 ENCOUNTER — Ambulatory Visit: Payer: Commercial Managed Care - HMO | Admitting: Speech Pathology

## 2016-11-30 ENCOUNTER — Ambulatory Visit: Payer: Commercial Managed Care - HMO | Admitting: Physical Therapy

## 2016-11-30 ENCOUNTER — Encounter: Payer: Commercial Managed Care - HMO | Admitting: Occupational Therapy

## 2016-11-30 DIAGNOSIS — R41841 Cognitive communication deficit: Secondary | ICD-10-CM

## 2016-11-30 DIAGNOSIS — R41842 Visuospatial deficit: Secondary | ICD-10-CM | POA: Diagnosis not present

## 2016-11-30 NOTE — Therapy (Signed)
[facility] [address] [place], [name], [address] Phone: [phone]   Fax:  [phone]  Speech Language Pathology Treatment  Patient Details  Name: Annette Munoz MRN: [id number] Date of Birth: Jun 07, 1966 Referring Provider: Dr. Lucianne Lei  Encounter Date: 11/30/2016      End of Session - 11/30/16 1205    Visit Number 28   Number of Visits 30   Date for SLP Re-Evaluation 12/15/16   SLP Start Time 8563   SLP Stop Time  52   SLP Time Calculation (min) 39 min      Past Medical History:  Diagnosis Date  . Diabetes mellitus without complication ([place])   . Hypertension   . Stroke [facility])     Past Surgical History:  Procedure Laterality Date  . CESAREAN SECTION    . TEE WITHOUT CARDIOVERSION N/A 08/10/2015   Procedure: TRANSESOPHAGEAL ECHOCARDIOGRAM (TEE);  Surgeon: [name], MD;  Location: [facility] ENDOSCOPY;  Service: Cardiovascular;  Laterality: N/A;    There were no vitals filed for this visit.      Subjective Assessment - 11/30/16 1032    Subjective "The doctor put her on a new medication for depression and attention"   Patient is accompained by: Family member   Special Tests daughter               ADULT SLP TREATMENT - 11/30/16 1034      General Information   Behavior/Cognition Alert;Cooperative;Pleasant mood     Treatment Provided   Treatment provided Cognitive-Linquistic     Pain Assessment   Pain Assessment No/denies pain     Cognitive-Linquistic Treatment   Treatment focused on Cognition;Patient/family/caregiver education   Skilled Treatment Pt utilized notebook to state the date and day of the week with rare min A. Alternating attention between detailed questions and reading detail chart with occasional min cues to attend to multiple details in a question. Organization and alternating attention faclitated by locating and ordering holidays using calendar with extended time and  rare min cues to locate holdays in calendar.      Assessment / Recommendations / Plan   Plan Continue with current plan of care     Progression Toward Goals   Progression toward goals Not progressing toward goals (comment)  pt with set back d/t fall/med changes            SLP Short Term Goals - 11/30/16 1204      SLP SHORT TERM GOAL #1   Title Pt will utilize external aids/memory book to be oriented to date, appointments and daily chores with occasional min A over 3 sessions   Period Weeks   Status Not Met     SLP SHORT TERM GOAL #2   Title Pt/family will report use of external aids/memory book at home for ADL's and medication mangement over 3 sessions   Baseline 07-21-16, 07-28-16, 11/28/16 with cues/supervision   Time 0   Period Weeks   Status Partially Met     SLP SHORT TERM GOAL #3   Title Pt will verbalize 3 cognitive impairments with mod A   Status Achieved     SLP SHORT TERM GOAL #4   Title Pt will attend to simple cognitive linguistic tasks in distracting enviroment for 10 mintues with occasional min A   Time 0   Period Weeks   Status Partially Met     SLP SHORT TERM GOAL #5   Title Pt will complete problemsolving/reasoning/thought organization tasks  with 90% accuracy given min assist   Time 0   Period Weeks   Status Partially Met          SLP Long Term Goals - 11/30/16 1204      SLP LONG TERM GOAL #1   Title Pt/family will report use of external memory aids at home with rare min cues, for successful f/u on daily schedule, safety precautions, chores, meds, etc over 3 sessions.   Baseline 11-14-16; 11/21/16, 11/28/16 - pt requires more than rare min cues and supervision to refer to notebook   Time 0   Period Weeks   Status Partially Met     SLP LONG TERM GOAL #2   Title Pt will verbalize 3 safety precautions due to cognitive impairments with mod A.    Status Achieved     SLP LONG TERM GOAL #3   Title Pt will complete moderatley complex reasoning,  organization, time and money problems with 75% accuracy and occasional min A   Time 0   Period Weeks   Status On-going     SLP LONG TERM GOAL #4   Title Pt will demonstrate emergent awareness by using memory compensation during therapy session x2 sessions   Baseline 08/15/16, 11/28/16 with cues   Status Achieved          Plan - 11/30/16 1204    Clinical Impression Statement Daughter reports pt with more sleepiness and slower thinking since fall/meds given in hospital. Today she required extended time and cueing for alternating attention and attention to details.. Cotinue skilled ST 1 more week (2 visits) to maximize cognition to reduce caretgiver burden.   Speech Therapy Frequency 2x / week   Duration 1 week   Treatment/Interventions Compensatory strategies;Patient/family education;Functional tasks;Cueing hierarchy;Internal/external aids;Cognitive reorganization;SLP instruction and feedback      Patient will benefit from skilled therapeutic intervention in order to improve the following deficits and impairments:   Cognitive communication deficit    Problem List Patient Active Problem List   Diagnosis Date Noted  . Seizure-like activity ([place]) 06/28/2016  . Cerebral infarction due to unspecified mechanism   . Gait difficulty   . Memory loss 05/22/2016  . Confusion   . Acute ischemic stroke ([place]) 05/18/2016  . Morbid obesity ([place]) 09/30/2015  . Uncontrolled type 2 diabetes mellitus with circulatory disorder, without long-term current use of insulin ([place])   . Essential hypertension   . Cerebrovascular accident (CVA) due to thrombosis of cerebral artery ([place])   . CVA (cerebral infarction) 08/07/2015  . Pure hypercholesterolemia 10/04/2009    Darly Fails, [name] MS, CCC-SLP 11/30/2016, 12:06 PM  [facility] [address] [place], [name], [address] Phone: [phone]   Fax:  [phone]   Name: Annette Munoz MRN: [id number] Date of Birth: 02/05/1966

## 2016-12-04 ENCOUNTER — Ambulatory Visit (INDEPENDENT_AMBULATORY_CARE_PROVIDER_SITE_OTHER): Payer: Commercial Managed Care - HMO | Admitting: Neurology

## 2016-12-04 ENCOUNTER — Encounter: Payer: Self-pay | Admitting: Neurology

## 2016-12-04 VITALS — BP 118/69 | HR 76 | Wt 180.4 lb

## 2016-12-04 DIAGNOSIS — I6330 Cerebral infarction due to thrombosis of unspecified cerebral artery: Secondary | ICD-10-CM | POA: Diagnosis not present

## 2016-12-04 DIAGNOSIS — E785 Hyperlipidemia, unspecified: Secondary | ICD-10-CM

## 2016-12-04 DIAGNOSIS — I10 Essential (primary) hypertension: Secondary | ICD-10-CM | POA: Diagnosis not present

## 2016-12-04 DIAGNOSIS — E1159 Type 2 diabetes mellitus with other circulatory complications: Secondary | ICD-10-CM | POA: Diagnosis not present

## 2016-12-04 DIAGNOSIS — F3289 Other specified depressive episodes: Secondary | ICD-10-CM

## 2016-12-04 DIAGNOSIS — I679 Cerebrovascular disease, unspecified: Secondary | ICD-10-CM

## 2016-12-04 NOTE — Patient Instructions (Addendum)
-   continue ASA and plavix and lipitor for stroke prevention - continue aricept for short memory loss. - close check BP and glucose at home and record and bring over to Dr. Parke SimmersBland for medication adjustment if needed - continue PT/OT and home exercise - Follow up with your primary care physician for stroke risk factor modification. Recommend maintain blood pressure goal <140/80, diabetes with hemoglobin A1c goal below 7.0% and lipids with LDL cholesterol goal below 70 mg/dL.  - diabetic diet and lose weight. Consider glucerna for nutrition supplement  - continue trintellix for depression which may take 4-6 weeks to work - follow up in 4 months with me.

## 2016-12-04 NOTE — Progress Notes (Signed)
STROKE NEUROLOGY FOLLOW UP NOTE  NAME: Annette Munoz DOB: Jun 06, 1966  REASON FOR VISIT: stroke follow up HISTORY FROM: pt and chart  Today we had the pleasure of seeing Annette Munoz in follow-up at our Neurology Clinic. Pt was accompanied by husband and daughter.   History Summary [name] is a 51 y.o. female with history of DM, HTN, obesity, HLD was admitted on 08/07/15 for AMS and amnesia. 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 MRA and CTA showed left ACA, right ACA, left M1, M2, and b/l PCA mild to moderate stenosis. Had LP and negative for vasculitis. CUS and TTE as well as TEE were unremarkable. Hypercoagulable and autoimmue work up negative. LDL 138 and A1C 7.2. Her symptoms resolved and she was discharged with ASA and lipitor.  09/30/15 follow up - the patient has been doing well. No recurrent stroke like symptoms. However, she did not check BP at home and her glucometer does not work. BP today 185/82. As per husband, she still has short term memory deficit. Pt not sure if she is on lisinopril which is on her medication list.  Follow up 12/20/15 - pt continued to have memory issues, not remember things with behavior changes since stroke. Pt seems more apathic, not compliant with medication, not refill her medication, not check BP or glucose at home and lack of activity at home. Today BP 178/86. 30 day cardiac event monitoring negative for afib. TCD MES suboptimal but negative for microemboli.   Follow-up 03/17/2016 - pt continued to be noncompliance. She did not take BP at home, nor checking glucose. Her medication ran out and has not picked up at pharmacy yet. She did not call back for diabetic education calls. She did not do much healthy diet and home exercise yet. She has not followed up with PCP. Her BP today 165/81.   Admission 05/18/2016 - patient was admitted due to acute confusion and worsening memory  loss. MRI showed right small thalamic infarct, superior than the first infarct. CUS and TTE unremarkable, LDL 83 and A1c 11.1. She had again diabetic education, and put on DAPT and continue on Lipitor 80 on discharge.  05/22/16 follow up - patient is stable, however, had profound short-term memory loss. Not able to remember 3 words given 1 minute ago. Family complains of memory loss causing dysfunction at home and at work. Occasionally lost direction during driving. BP super high today 238/130. Noncompliant diabetic diet and BP/glucose monitoring at home.  06/19/16 admission - for right hemiparesis and gait instability. MRI showed again small acute infarcts at left PLIC, still consistent with small vessel disease. MRA showed moderate proximal R VA stenosis. LDL 62 and A1C 7.2. She was continued on DAPT and lipitor on discharge with outpt PT/OT.   08/03/16 follow up - pt has been following with PT/OT and had improvement. Currently PT/OT twice a week. BP and glucose controlled well as per daughter. Lost 10lbs for the last 2 weeks. Still on aricept at 10mg . As per pt daughter, confusion is gone but still has short memory issue at home. Pt still has intermittent crying spells because she is worrying about her bills as she is not working now. BP today 125/70.   Interval History During the interval time, pt has been doing better. No recurrent stroke. And she lost 60 lbs. She stated that she ate less as she does not have much appetite. Her BP much better  and actually was low, her BP meds were cut in half. Her glucose also at lower side, and she is off insulin, on glyxambi and metformin. She has depressed mood and was recently put on Vortioxetine. Had mechanical fall on 11/25/16 with right hip and right knee pain but no fracture or hitting head. 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 BP today 118/69.   REVIEW OF SYSTEMS: Full 14 system review of systems performed and notable only for those listed below and in HPI above,  all others are negative:  Constitutional:   Cardiovascular:  Ear/Nose/Throat:   Skin:  Eyes:   Respiratory:   Gastroitestinal:   Genitourinary:  Hematology/Lymphatic:   Endocrine:  Musculoskeletal:   Joint pain, walking difficulty Allergy/Immunology:   Neurological:   Psychiatric:  Sleep:   The following represents the patient's updated allergies and side effects list: Allergies  Allergen Reactions  . Glyxambi [Empagliflozin-Linagliptin] Nausea And Vomiting    Makes PT VERY SICK  . Lisinopril Cough    The neurologically relevant items on the patient's problem list were reviewed on today's visit.  Neurologic Examination  A problem focused neurological exam (12 or more points of the single system neurologic examination, vital signs counts as 1 point, cranial nerves count for 8 points) was performed.  Blood pressure 118/69, pulse 76, weight 180 lb 6.4 oz (81.8 kg).  General - obese, well developed, in no apparent distress.  Ophthalmologic - Fundi not visualized due to eye movement.  Cardiovascular - Regular rate and rhythm.  Mental Status -  Level of arousal and orientation to time, place, and person were intact. Language including expression, naming, repetition, comprehension was assessed and found intact. Able to follow two step commands without difficulty Able to calculate and spell backward. Registration 3/3 but delayed recall 0/3.  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 III, IV, VI - Extraocular movements intact. V - Facial sensation intact bilaterally. VII - Facial movement intact bilaterally. VIII - Hearing & vestibular intact bilaterally. X - Palate elevates symmetrically. XI - Chin turning & shoulder shrug intact bilaterally. XII - Tongue protrusion intact.  Motor Strength - The patient's strength was RUE 5-/5 with pronator drift and difficulty with dexterity, RLE 4+/5 with leg brace. 5/5 LUE and LLE.  Bulk  was normal and fasciculations were absent.   Motor Tone - Muscle tone was assessed at the neck and appendages and was normal.  Reflexes - The patient's reflexes were 1+ in all extremities and she had no pathological reflexes.  Sensory - Light touch, temperature/pinprick were assessed and were normal.    Coordination - The patient had normal movements in the hands with no ataxia or dysmetria.  Tremor was absent.  Gait and Station - walk with walker and right mild hemiparetic gait.  Data reviewed: I personally reviewed the images and agree with the radiology interpretations.  Ct Head Wo Contrast 08/07/2015  Focal low density in the left frontal lobe white matter which may be in the corpus callosum. Acute infarct is not excluded.   Mr Maxine GlennMra Head/brain Wo Cm 08/07/2015  1. Multiple small, acute left ACA territory infarcts involving the corpus callosum, cingulate gyrus, and superior left frontal lobe.  2. Small acute infarct involving the anteroinferior right thalamus/cerebral peduncle.  3. Moderately to severely motion degraded head MRA without evidence of large vessel occlusion or flow limiting proximal stenosis. Suggestion of A2 ACA segmental narrowing.   CTA head  08/10/2015 1. Infarcts  involving the right hypothalamus, anterior genu of corpus callosum on the left, left distal ACA distribution. 2. A high-grade stenosis in proximal right A2 segment and more distal left A3 segment, within the left pericallosal artery. 3. Diffuse attenuation of distal ACA branch vessels, worse on the left. 4. Moderate proximal more mild distal left M1 segment stenoses. 5. Moderate proximal left M2 stenoses beyond the bifurcation. 6. Mild attenuation of distal MCA branch vessels bilaterally. 7. Moderate proximal PCA stenoses bilaterally with significant attenuation of distal small vessels.  Carotid Doppler 1-39% ICA plaquing. Vertebral artery flow is antegrade.  2D Echocardiogram  - Left  ventricle: The cavity size was normal. There was mildconcentric hypertrophy. Systolic function was normal. Theestimated ejection fraction was in the range of 55% to 60%. Wallmotion was normal; there were no regional wall motionabnormalities. - Left atrium: The atrium was mildly dilated. - Atrial septum: No defect or patent foramen ovale was identified.  LE venous dopplers negative for DVT  TEE  1. No LAA thrombus 2. Negative for PFO 3. No significant valvular disease 4. LVEF 55-60%  30 day cardiac monitoring - negative for afib  TCD MES - suboptimal study, no MES seen.   Mri & Mra Brain Wo Contrast (neuro Protocol) 05/18/2016 New area of acute infarction, medial RIGHT hemisphere affecting anterior-most thalamus as well as the genu of the internal capsule. It is unclear if this involves the anterior choroidal artery territory, or medial thalamoperforate territory. MCA lenticulostriate territory involvement is unlikely. Chronic changes as described, with resolved predominantly left-sided infarcts in 2016. Similar pattern of intracranial atherosclerosis when compared with prior CTA from 08/10/2015.   Carotid Doppler  05/19/16 There is 1-39% bilateral ICA stenosis. Vertebral artery flow is antegrade.    2D Echocardiogram 05/19/16 - Left ventricle: The cavity size was normal. Wall thickness wasincreased in a pattern of moderate LVH. There was mild concentrichypertrophy. Systolic function was normal. The estimated ejectionfraction was in the range of 60% to 65%. Wall motion was normal;there were no regional wall motion abnormalities. Dopplerparameters are consistent with abnormal left ventricularrelaxation (grade 1 diastolic dysfunction). The E/e&' ratio isbetween 8-15, suggesting indeterminate LV filling pressure. - Left atrium: Moderately dilated. - Right atrium: The atrium was at the upper limits of normal insize. - Inferior vena cava: The vessel was normal in size.  Therespirophasic diameter changes were in the normal range (>= 50%),consistent with normal central venous pressure. Impressions:    Compared to a prior study in 2016, there is now mild LVH, LVEF60-65%, diastolic dysfunction and moderate left atrialenlargement.  Mr [facility] and Neck Wo Contrast 06/19/2016 1. Small acute infarcts primarily involving the posterior limb of the left internal capsule.  2. Unchanged chronic infarcts as above.  3. Unchanged advanced intracranial atherosclerosis. No major vessel occlusion.  4. No cervical carotid artery stenosis.  5. Widely patent and strongly dominant left vertebral artery.  6. Moderate proximal right vertebral artery stenosis.    CSF Component     Latest Ref Rng 08/11/2015         2:56 PM  Tube #      1  Color, CSF     COLORLESS COLORLESS  Appearance, CSF     CLEAR CLEAR  Supernatant      NOT INDICATED  RBC Count, CSF     0 /cu mm 48 (H)  WBC, CSF     0 - 5 /cu mm 1  Other Cells, CSF      TOO FEW TO COUNT, SMEAR  AVAILABLE FOR REVIEW  Specimen Description        Special Requests        Gram Stain        Culture        Report Status        Fungal Smear        Crypto Ag     NEGATIVE NEGATIVE  Cryptococcal Ag Titer     NOT INDICATED NOT INDICATED  Glucose, CSF     40 - 70 mg/dL 87 (H)  Total  Protein, CSF     15 - 45 mg/dL 21  VDRL Quant, CSF     Non Rea:<1:1 Non Reactive  CSF Oligoclonal Bands      Comment   Component     Latest Ref Rng 08/11/2015         2:57 PM  Tube #        Color, CSF     COLORLESS   Appearance, CSF     CLEAR   Supernatant        RBC Count, CSF     0 /cu mm   WBC, CSF     0 - 5 /cu mm   Other Cells, CSF        Specimen Description      CSF  Special Requests      NONE  Gram Stain      WBC PRESENT, PREDOMINANTLY MONONUCLEAR . . .  Culture      NO GROWTH 3 DAYS  Report Status      08/14/2015 FINAL  Fungal Smear        Crypto Ag     NEGATIVE   Cryptococcal Ag Titer      NOT INDICATED   Glucose, CSF     40 - 70 mg/dL   Total  Protein, CSF     15 - 45 mg/dL   VDRL Quant, CSF     Non Rea:<1:1   CSF Oligoclonal Bands         Component     Latest Ref Rng 08/11/2015         2:58 PM  Tube #        Color, CSF     COLORLESS   Appearance, CSF     CLEAR   Supernatant        RBC Count, CSF     0 /cu mm   WBC, CSF     0 - 5 /cu mm   Other Cells, CSF        Specimen Description      CSF  Special Requests      NONE  Gram Stain        Culture      No Fungi Isolated in 4 Weeks . . .  Report Status      09/08/2015 FINAL  Fungal Smear      NO YEAST OR FUNGAL ELEMENTS SEEN . . .  Crypto Ag     NEGATIVE   Cryptococcal Ag Titer     NOT INDICATED   Glucose, CSF     40 - 70 mg/dL   Total  Protein, CSF     15 - 45 mg/dL   VDRL Quant, CSF     Non Rea:<1:1   CSF Oligoclonal Bands         Component     Latest Ref Rng 08/08/2015 08/10/2015 08/11/2015  Cholesterol     0 - 200 mg/dL 161 (  H)    Triglycerides     <150 mg/dL 161    HDL Cholesterol     >40 mg/dL 43    Total CHOL/HDL Ratio      4.8    VLDL     0 - 40 mg/dL 25    LDL (calc)     0 - 99 mg/dL 096 (H)    PTT Lupus Anticoagulant     0.0 - 40.6 sec  35.4   DRVVT     0.0 - 44.0 sec  34.0   Lupus Anticoag Interp       Comment:   Anticardiolipin IgG     0 - 14 GPL U/mL  <9   Anticardiolipin IgM     0 - 12 MPL U/mL  <9   Anticardiolipin IgA     0 - 11 APL U/mL  <9   Beta-2 Glyco I IgG     0 - 20 GPI IgG units  <9   Beta-2-Glycoprotein I IgM     0 - 32 GPI IgM units  <9   Beta-2-Glycoprotein I IgA     0 - 25 GPI IgA units  <9   C-ANCA     Neg:<1:20 titer  <1:20   P-ANCA     Neg:<1:20 titer  <1:20   Atypical P-ANCA titer     Neg:<1:20 titer  <1:20   Hemoglobin A1C     4.8 - 5.6 % 7.2 (H)    Mean Plasma Glucose      160    Recommendations-PTGENE:       Comment   Additional Information       Comment   Source Varicella-Zoster, PCR        BLOOD  Varicella-Zoster,  PCR     Negative   Negative  ANA Ab, IFA       Negative   C3 Complement     82 - 167 mg/dL  045   Complement C4, Body Fluid     14 - 44 mg/dL  31   Compl, Total (WU98)     42 - 60 U/mL  > 60   HIV     Non Reactive  Non Reactive   Antithrombin Activity     75 - 120 %  103   Protein C Activity     73 - 180 %  152   Protein C, Total     60 - 150 %  131   Protein S Activity     63 - 140 %  85   Protein S Ag, Total     60 - 150 %  110   Homocysteine     0.0 - 15.0 umol/L  7.4   RPR     Non Reactive  Non Reactive   Sed Rate     0 - 22 mm/hr  46 (H)   ds DNA Ab     0 - 9 IU/mL  <1   SSA (Ro) (ENA) Antibody, IgG     0.0 - 0.9 AI  <0.2   SSB (La) (ENA) Antibody, IgG     0.0 - 0.9 AI  <0.2   Varicella     Immune >165 index  2055    Component     Latest Ref Rng & Units 06/19/2016 06/20/2016  Cholesterol     0 - 200 mg/dL  119  Triglycerides     <150 mg/dL  147  HDL Cholesterol     >40  mg/dL  28 (L)  Total CHOL/HDL Ratio     RATIO  4.1  VLDL     0 - 40 mg/dL  26  LDL (calc)     0 - 99 mg/dL  62  Hemoglobin W0J     4.8 - 5.6 %  7.2 (H)  Mean Plasma Glucose     mg/dL  811  TSH     9.147 - 8.295 uIU/mL 1.171     Assessment: As you may recall, she is a 51 y.o. African American female with PMH of DM, HTN, obesity, HLD was admitted on 08/07/15 for AMS and amnesia. 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 MRA and CTA showed left ACA, right ACA, left M1, M2, and b/l PCA mild to moderate stenosis. Had LP and negative for vasculitis. CUS and TTE as well as TEE were unremarkable. Hypercoagulable and autoimmue work up negative. LDL 138 and A1C 7.2. Her symptoms resolved and she was discharged with ASA and lipitor. 30 day cardiac monitoring and TCD MES all negative. Pt declined RESPECT ESUS. Readmitted on 05/18/16 due to confusion and profound memory loss, found to have right thalamic small infarct superior to prior infarct.  MRA unchanged from prior CTA. CUS and TTE unremarkable, LDL 83 and A1c 11.1. She was discharged on DAPT and continued with high-dose Lipitor. Admitted again on 06/19/16 for small acute infarcts at left PLIC, still consistent with small vessel disease. LDL 62 and A1C 7.2. She was continued on DAPT and lipitor on discharge with outpt PT/OT. On aricept at 10mg  for short-term memory loss. Overtime, her BP and glucose much better, lost weight. 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 Depression and put on vortioxetine.   Plan:  - continue ASA and plavix and lipitor for stroke prevention - continue aricept for short memory loss. - close check BP and glucose at home and record and bring over to Dr. Parke Simmers for medication adjustment if needed - continue PT/OT and home exercise - Follow up with your primary care physician for stroke risk factor modification. Recommend maintain blood pressure goal <140/80, diabetes with hemoglobin A1c goal below 7.0% and lipids with LDL cholesterol goal below 70 mg/dL.  - diabetic diet and lose weight. Consider glucerna for nutrition supplement  - continue trintellix for depression which may take 4-6 weeks to work - follow up in 4 months with me.   I spent more than 25 minutes of face to face time with the patient. Greater than 50% of time was spent in counseling and coordination of care. We have discussed about BP and glucose monitoring, depression treatment, PT/OT and compliance with medication.    No orders of the defined types were placed in this encounter.   Meds ordered this encounter  Medications  . linagliptin (TRADJENTA) 5 MG TABS tablet    Sig: Take 5 mg by mouth daily.  . empagliflozin (JARDIANCE) 10 MG TABS tablet    Sig: Take 10 mg by mouth daily.    Patient Instructions  - continue ASA and plavix and lipitor for stroke prevention - continue aricept for short memory loss. - close check BP and glucose at home and record and bring over to Dr. Parke Simmers for  medication adjustment if needed - continue PT/OT and home exercise - Follow up with your primary care physician for stroke risk factor modification. Recommend maintain blood pressure goal <140/80, diabetes with hemoglobin A1c goal below 7.0% and lipids with LDL  cholesterol goal below 70 mg/dL.  - diabetic diet and lose weight. Consider glucerna for nutrition supplement  - continue trintellix for depression which may take 4-6 weeks to work - follow up in 4 months with me.    Marvel Plan, MD PhD [facility] [address], [address] [place], [place] [address] [phone]

## 2016-12-05 ENCOUNTER — Ambulatory Visit: Payer: Commercial Managed Care - HMO | Admitting: *Deleted

## 2016-12-05 ENCOUNTER — Ambulatory Visit: Payer: Commercial Managed Care - HMO | Admitting: Physical Therapy

## 2016-12-05 ENCOUNTER — Encounter: Payer: Commercial Managed Care - HMO | Admitting: Occupational Therapy

## 2016-12-05 DIAGNOSIS — R41841 Cognitive communication deficit: Secondary | ICD-10-CM

## 2016-12-05 DIAGNOSIS — I679 Cerebrovascular disease, unspecified: Secondary | ICD-10-CM | POA: Insufficient documentation

## 2016-12-05 DIAGNOSIS — F329 Major depressive disorder, single episode, unspecified: Secondary | ICD-10-CM | POA: Insufficient documentation

## 2016-12-05 DIAGNOSIS — F32A Depression, unspecified: Secondary | ICD-10-CM | POA: Insufficient documentation

## 2016-12-05 DIAGNOSIS — R41842 Visuospatial deficit: Secondary | ICD-10-CM | POA: Diagnosis not present

## 2016-12-05 NOTE — Therapy (Signed)
[facility] [address] [place], [name], [address] Phone: [phone]   Fax:  [phone]  Speech Language Pathology Treatment  Patient Details  Name: Annette Munoz MRN: [id number] Date of Birth: Nov 24, 1965 Referring Provider: Dr. Lucianne Lei  Encounter Date: 12/05/2016      End of Session - 12/05/16 1134    Visit Number 29   Number of Visits 30   Date for SLP Re-Evaluation 12/15/16   SLP Start Time 0950   SLP Stop Time  1015   SLP Time Calculation (min) 25 min   Activity Tolerance Patient tolerated treatment well      Past Medical History:  Diagnosis Date  . Diabetes mellitus without complication ([place])   . Hypertension   . Stroke [facility])     Past Surgical History:  Procedure Laterality Date  . CESAREAN SECTION    . TEE WITHOUT CARDIOVERSION N/A 08/10/2015   Procedure: TRANSESOPHAGEAL ECHOCARDIOGRAM (TEE);  Surgeon: [name], MD;  Location: [facility] ENDOSCOPY;  Service: Cardiovascular;  Laterality: N/A;    There were no vitals filed for this visit.      Subjective Assessment - 12/05/16 0951    Subjective I didn't do anything this weekend   Patient is accompained by: Family member  daughter   Currently in Pain? No/denies               ADULT SLP TREATMENT - 12/05/16 0001      General Information   Behavior/Cognition Alert;Cooperative;Pleasant mood     Treatment Provided   Treatment provided Cognitive-Linquistic     Pain Assessment   Pain Assessment No/denies pain     Cognitive-Linquistic Treatment   Treatment focused on Cognition;Patient/family/caregiver education   Skilled Treatment Skilled ST session focused on education regarding continuing tasks/activities at home after DC from [place] (next session last). SLP provided weekly calendar to write appointments, meal plans, to do lists, grocery lists, etc. Pt was encouraged to write anything and everything to establish a new habit/routine of  writing information down for functional recall. Daughter was encouraged to let pt be the one that writes on the calendar pages.      Assessment / Recommendations / Plan   Plan Continue with current plan of care     Progression Toward Goals   Progression toward goals Not progressing toward goals (fall, med changes)          SLP Education - 12/05/16 1134    Education provided Yes   Education Details PATIENT is to write something on weekly calendar every day.   Person(s) Educated Patient;Child(ren)   Methods Explanation;Demonstration;Verbal cues;Handout   Comprehension Verbalized understanding;Need further instruction;Verbal cues required          SLP Short Term Goals - 12/05/16 1136      SLP SHORT TERM GOAL #1   Title Pt will utilize external aids/memory book to be oriented to date, appointments and daily chores with occasional min A over 3 sessions   Status Not Met     SLP SHORT TERM GOAL #2   Title Pt/family will report use of external aids/memory book at home for ADL's and medication mangement over 3 sessions   Status Partially Met     SLP SHORT TERM GOAL #3   Title Pt will verbalize 3 cognitive impairments with mod A   Status Achieved     SLP SHORT TERM GOAL #4   Title Pt will attend to simple cognitive linguistic tasks in distracting enviroment for  10 mintues with occasional min A   Status Partially Met     SLP SHORT TERM GOAL #5   Title Pt will complete problemsolving/reasoning/thought organization tasks with 90% accuracy given min assist   Status Partially Met          SLP Long Term Goals - 12/05/16 1136      SLP LONG TERM GOAL #1   Title Pt/family will report use of external memory aids at home with rare min cues, for successful f/u on daily schedule, safety precautions, chores, meds, etc over 3 sessions.   Status Partially Met     SLP LONG TERM GOAL #2   Title Pt will verbalize 3 safety precautions due to cognitive impairments with mod A.    Status  Achieved     SLP LONG TERM GOAL #3   Title Pt will complete moderatley complex reasoning, organization, time and money problems with 75% accuracy and occasional min A   Status Partially Met     SLP LONG TERM GOAL #4   Title Pt will demonstrate emergent awareness by using memory compensation during therapy session x2 sessions   Status Achieved          Plan - 12/05/16 1134    Clinical Impression Statement Daughter aware that ST is ending soon. She and pt were encouraged to continue using cognitive stimulating activities and weekly calendar for continued work on functional and effective recall and decreased caregiver burden. Next session will be DC session.    Speech Therapy Frequency --  1 more visit   Duration 1 week   Treatment/Interventions Compensatory strategies;Patient/family education;Functional tasks;Cueing hierarchy;Internal/external aids;Cognitive reorganization;SLP instruction and feedback   Potential to Achieve Goals Fair   Potential Considerations Severity of impairments;Previous level of function;Ability to learn/carryover information;Cooperation/participation level;Family/community support   SLP Home Exercise Plan reviewed with pt/dtr   Consulted and Agree with Plan of Care Patient;Family member/caregiver   Family Member Consulted daughter      Patient will benefit from skilled therapeutic intervention in order to improve the following deficits and impairments:   Cognitive communication deficit    Problem List Patient Active Problem List   Diagnosis Date Noted  . Intracranial vascular stenosis 12/05/2016  . Depression 12/05/2016  . Seizure-like activity ([place]) 06/28/2016  . 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 Gait difficulty   . Memory loss 05/22/2016  . Confusion   . Acute ischemic stroke ([place]) 05/18/2016  . Morbid obesity ([place]) 09/30/2015  . Diabetes mellitus ([place])   . Essential hypertension   . Hyperlipidemia   .  Cerebrovascular accident (CVA) due to thrombosis of cerebral artery ([place])   . CVA (cerebral infarction) 08/07/2015  . Pure hypercholesterolemia 10/04/2009   Masaji Billups B. [place], MSP, CCC-SLP  Shonna Chock 12/05/2016, 11:37 AM  [facility] [address] [place], [name], [address] Phone: [phone]   Fax:  [phone]   Name: Annette Munoz MRN: [id number] Date of Birth: 06/08/1966

## 2016-12-06 DIAGNOSIS — I6930 Unspecified sequelae of cerebral infarction: Secondary | ICD-10-CM | POA: Diagnosis not present

## 2016-12-06 DIAGNOSIS — E1122 Type 2 diabetes mellitus with diabetic chronic kidney disease: Secondary | ICD-10-CM | POA: Diagnosis not present

## 2016-12-06 DIAGNOSIS — I119 Hypertensive heart disease without heart failure: Secondary | ICD-10-CM | POA: Diagnosis not present

## 2016-12-07 ENCOUNTER — Encounter: Payer: Commercial Managed Care - HMO | Admitting: Occupational Therapy

## 2016-12-07 ENCOUNTER — Ambulatory Visit: Payer: Commercial Managed Care - HMO | Admitting: Speech Pathology

## 2016-12-07 ENCOUNTER — Ambulatory Visit: Payer: Commercial Managed Care - HMO | Admitting: Physical Therapy

## 2016-12-07 DIAGNOSIS — R41841 Cognitive communication deficit: Secondary | ICD-10-CM

## 2016-12-07 DIAGNOSIS — R41842 Visuospatial deficit: Secondary | ICD-10-CM | POA: Diagnosis not present

## 2016-12-07 NOTE — Patient Instructions (Signed)
  Continue to write in calendar daily  Continue to do activities on computer, phone  Schedule outings and activities on her calendar a few days ahead and reminder her several times during the week that she is going out.  Put exercise times on calendar to make sure they get done  [employer], movies, back home, [place], [place] [employer],   Homework - look up movie schedule and pick one to plan to go to go - put it on your calendar  Call Selena BattenKim and schedule  Regular hair appointments  Natural consequences are OK

## 2016-12-07 NOTE — Therapy (Signed)
[facility] [address] [place], [place], [address] Phone: [phone]   Fax:  [phone]  Speech Language Pathology Treatment  Patient Details  Name: Rabecca H Freund MRN: [id number] Date of Birth: 10/04/1966 Referring Provider: Dr. Veita Bland  Encounter Date: 12/07/2016      End of Session - 12/07/16 1105    Visit Number 30   Number of Visits 30   Date for SLP Re-Evaluation 12/15/16   SLP Start Time 1017   SLP Stop Time  1100   SLP Time Calculation (min) 43 min   Activity Tolerance Patient tolerated treatment well      Past Medical History:  Diagnosis Date  . Diabetes mellitus without complication (HCC)   . Hypertension   . Stroke (HCC)     Past Surgical History:  Procedure Laterality Date  . CESAREAN SECTION    . TEE WITHOUT CARDIOVERSION N/A 08/10/2015   Procedure: TRANSESOPHAGEAL ECHOCARDIOGRAM (TEE);  Surgeon: Kenneth C Hilty, MD;  Location: [facility];  Service: Cardiovascular;  Laterality: N/A;    There were no vitals filed for this visit.      Subjective Assessment - 12/07/16 1026    Subjective "We went to the neurologist and he said 4-6 weeks we will see much improvement in her depression"   Patient is accompained by: Family member   Special Tests daughter               ADULT SLP TREATMENT - 12/07/16 1030      General Information   Behavior/Cognition Alert;Cooperative;Pleasant mood     Treatment Provided   Treatment provided Cognitive-Linquistic     Pain Assessment   Pain Assessment No/denies pain     Cognitive-Linquistic Treatment   Treatment focused on Cognition;Patient/family/caregiver education   Skilled Treatment Continued training in use of writing in calendar every day of meals, daily activities, meal plans, lists.  Generated list of outings and activities Mariona can do with min questioning cues. Instructed Tina to add outings to her calendar and to schedule time for  exercise. Pt required occasional min A to use calendar to ID date and activities for today. She reqiured min questioning cues to name president.      Assessment / Recommendations / Plan   Plan Continue with current plan of care     Progression Toward Goals   Progression toward goals Not progressing toward goals (comment)  depression/lack of motivation     SPEECH THERAPY DISCHARGE SUMMARY  Visits from Start of Care: 30  Current functional level related to goals / functional outcomes: See goals below   Remaining deficits: Severe short term memory, attention, awareness, problem solving   Education / Equipment: External aids for recall and schedule management, cognitive activities to do at home,   Plan: Patient agrees to discharge.  Patient goals were partially met. Patient is being discharged due to financial reasons.  ?????            SLP Short Term Goals - 12/07/16 1105      SLP SHORT TERM GOAL #1   Title Pt will utilize external aids/memory book to be oriented to date, appointments and daily chores with occasional min A over 3 sessions   Status Not Met     SLP SHORT TERM GOAL #2   Title Pt/family will report use of external aids/memory book at home for ADL's and medication mangement over 3 sessions   Status Partially Met     SLP SHORT TERM GOAL #  3   Title Pt will verbalize 3 cognitive impairments with mod A   Status Achieved     SLP SHORT TERM GOAL #4   Title Pt will attend to simple cognitive linguistic tasks in distracting enviroment for 10 mintues with occasional min A   Status Partially Met     SLP SHORT TERM GOAL #5   Title Pt will complete problemsolving/reasoning/thought organization tasks with 90% accuracy given min assist   Status Partially Met          SLP Long Term Goals - 12/07/16 1105      SLP LONG TERM GOAL #1   Title Pt/family will report use of external memory aids at home with rare min cues, for successful f/u on daily schedule, safety  precautions, chores, meds, etc over 3 sessions.   Status Partially Met     SLP LONG TERM GOAL #2   Title Pt will verbalize 3 safety precautions due to cognitive impairments with mod A.    Status Achieved     SLP LONG TERM GOAL #3   Title Pt will complete moderatley complex reasoning, organization, time and money problems with 75% accuracy and occasional min A   Status Partially Met     SLP LONG TERM GOAL #4   Title Pt will demonstrate emergent awareness by using memory compensation during therapy session x2 sessions   Status Achieved          Plan - 12/07/16 1102    Clinical Impression Statement Pt continues to require mod A for recall and mod to max A for participation in ADL's outside of home. Continued training on use of calendar and actvities/outings/exercise to schedule. D/C ST at this time - pt to return for f/u in 4-6 months, if cognition improved.    Potential to Achieve Goals Fair   Potential Considerations Severity of impairments;Previous level of function;Ability to learn/carryover information;Cooperation/participation level;Family/community support   SLP Home Exercise Plan reviewed with pt/dtr   Consulted and Agree with Plan of Care Patient;Family member/caregiver      Patient will benefit from skilled therapeutic intervention in order to improve the following deficits and impairments:   Cognitive communication deficit    Problem List Patient Active Problem List   Diagnosis Date Noted  . Intracranial vascular stenosis 12/05/2016  . Depression 12/05/2016  . Seizure-like activity (HCC) 06/28/2016  . 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 Gait difficulty   . Memory loss 05/22/2016  . Confusion   . Acute ischemic stroke (HCC) 05/18/2016  . Morbid obesity (HCC) 09/30/2015  . Diabetes mellitus (HCC)   . Essential hypertension   . Hyperlipidemia   . Cerebrovascular accident (CVA) due to thrombosis of cerebral artery (HCC)   . CVA  (cerebral infarction) 08/07/2015  . Pure hypercholesterolemia 10/04/2009    ,  Ann 12/07/2016, 12:12 PM  [facility] [address] [place], [place], [address] Phone: [phone]   Fax:  [phone]   Name: Katrinka H Mcleroy MRN: [id number] Date of Birth: 06/18/1966 

## 2016-12-12 ENCOUNTER — Encounter: Payer: Commercial Managed Care - HMO | Admitting: *Deleted

## 2016-12-14 ENCOUNTER — Encounter: Payer: Commercial Managed Care - HMO | Admitting: Speech Pathology

## 2016-12-26 ENCOUNTER — Other Ambulatory Visit: Payer: Self-pay | Admitting: Urgent Care

## 2016-12-26 NOTE — Telephone Encounter (Signed)
Left message to clarify. Unclear if patient's PCP is here (last seen 2016) or Dr. Parke SimmersBland. If us, patient needs to schedule, and OK to authorize enough to get to appointment. If Dr. Parke SimmersBland, send to her.

## 2016-12-26 NOTE — Telephone Encounter (Signed)
chelle this request needs to go to dr bland   Thanks

## 2017-01-02 ENCOUNTER — Other Ambulatory Visit: Payer: Self-pay | Admitting: Family Medicine

## 2017-01-02 ENCOUNTER — Other Ambulatory Visit (HOSPITAL_COMMUNITY)
Admission: RE | Admit: 2017-01-02 | Discharge: 2017-01-02 | Disposition: A | Discharge status: Home / Self Care | Payer: Commercial Managed Care - HMO | Source: Ambulatory Visit | Attending: Family Medicine | Admitting: Family Medicine

## 2017-01-02 DIAGNOSIS — N390 Urinary tract infection, site not specified: Secondary | ICD-10-CM | POA: Diagnosis not present

## 2017-01-02 DIAGNOSIS — Z113 Encounter for screening for infections with a predominantly sexual mode of transmission: Secondary | ICD-10-CM | POA: Insufficient documentation

## 2017-01-02 DIAGNOSIS — I119 Hypertensive heart disease without heart failure: Secondary | ICD-10-CM | POA: Diagnosis not present

## 2017-01-02 DIAGNOSIS — E119 Type 2 diabetes mellitus without complications: Secondary | ICD-10-CM | POA: Diagnosis not present

## 2017-01-02 DIAGNOSIS — I6930 Unspecified sequelae of cerebral infarction: Secondary | ICD-10-CM | POA: Diagnosis not present

## 2017-01-08 LAB — URINE CYTOLOGY ANCILLARY ONLY
BACTERIAL VAGINITIS: NEGATIVE
CANDIDA VAGINITIS: POSITIVE — AB
CHLAMYDIA, DNA PROBE: NEGATIVE
NEISSERIA GONORRHEA: NEGATIVE
Trichomonas: NEGATIVE

## 2017-01-10 ENCOUNTER — Other Ambulatory Visit: Payer: Self-pay | Admitting: Urgent Care

## 2017-01-11 ENCOUNTER — Other Ambulatory Visit: Payer: Self-pay | Admitting: Neurology

## 2017-01-11 DIAGNOSIS — I63522 Cerebral infarction due to unspecified occlusion or stenosis of left anterior cerebral artery: Secondary | ICD-10-CM

## 2017-01-11 DIAGNOSIS — I10 Essential (primary) hypertension: Secondary | ICD-10-CM

## 2017-01-24 DIAGNOSIS — E119 Type 2 diabetes mellitus without complications: Secondary | ICD-10-CM | POA: Diagnosis not present

## 2017-01-24 DIAGNOSIS — I6930 Unspecified sequelae of cerebral infarction: Secondary | ICD-10-CM | POA: Diagnosis not present

## 2017-02-06 ENCOUNTER — Other Ambulatory Visit: Payer: Self-pay | Admitting: Urgent Care

## 2017-02-07 DIAGNOSIS — Z0289 Encounter for other administrative examinations: Secondary | ICD-10-CM

## 2017-03-29 DIAGNOSIS — E1122 Type 2 diabetes mellitus with diabetic chronic kidney disease: Secondary | ICD-10-CM | POA: Diagnosis not present

## 2017-03-29 DIAGNOSIS — I639 Cerebral infarction, unspecified: Secondary | ICD-10-CM | POA: Diagnosis not present

## 2017-03-29 DIAGNOSIS — I119 Hypertensive heart disease without heart failure: Secondary | ICD-10-CM | POA: Diagnosis not present

## 2017-04-10 ENCOUNTER — Encounter: Payer: Self-pay | Admitting: Neurology

## 2017-04-10 ENCOUNTER — Ambulatory Visit (INDEPENDENT_AMBULATORY_CARE_PROVIDER_SITE_OTHER): Payer: 59 | Admitting: Neurology

## 2017-04-10 VITALS — BP 119/76 | HR 70 | Wt 170.4 lb

## 2017-04-10 DIAGNOSIS — E1159 Type 2 diabetes mellitus with other circulatory complications: Secondary | ICD-10-CM

## 2017-04-10 DIAGNOSIS — I10 Essential (primary) hypertension: Secondary | ICD-10-CM

## 2017-04-10 DIAGNOSIS — E785 Hyperlipidemia, unspecified: Secondary | ICD-10-CM

## 2017-04-10 DIAGNOSIS — I6330 Cerebral infarction due to thrombosis of unspecified cerebral artery: Secondary | ICD-10-CM | POA: Diagnosis not present

## 2017-04-10 DIAGNOSIS — I679 Cerebrovascular disease, unspecified: Secondary | ICD-10-CM

## 2017-04-10 NOTE — Patient Instructions (Addendum)
-   continue ASA and plavix and lipitor for stroke prevention. Will consider to discontinue ASA next visit. - continue aricept for short memory loss. - close check BP and glucose at home - continue follow up with eye doctor for visual field monitoring. - continue home self exercise - Follow up with your primary care physician for stroke risk factor modification. Recommend maintain blood pressure goal <140/80, diabetes with hemoglobin A1c goal below 7.0% and lipids with LDL cholesterol goal below 70 mg/dL.  - diabetic diet and lose weight.  - continue trintellix for depression  - recommend B12 supplement such as 500 microgram daily  - follow up in 6 months.

## 2017-04-10 NOTE — Progress Notes (Signed)
STROKE NEUROLOGY FOLLOW UP NOTE  NAME: Annette Munoz DOB: 04/14/1966  REASON FOR VISIT: stroke follow up HISTORY FROM: pt and chart  Today we had the pleasure of seeing Annette SanerBetty H Forrest in follow-up at our [facility]. Pt was accompanied by husband and daughter.   History Summary [name] is a 51 y.o. female with history of DM, HTN, obesity, HLD was admitted on 08/07/15 for AMS and amnesia. 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 MRA and CTA showed left ACA, right ACA, left M1, M2, and b/l PCA mild to moderate stenosis. Had LP and negative for vasculitis. CUS and TTE as well as TEE were unremarkable. Hypercoagulable and autoimmue work up negative. LDL 138 and A1C 7.2. Her symptoms resolved and she was discharged with ASA and lipitor.  09/30/15 follow up - the patient has been doing well. No recurrent stroke like symptoms. However, she did not check BP at home and her glucometer does not work. BP today 185/82. As per husband, she still has short term memory deficit. Pt not sure if she is on lisinopril which is on her medication list.  Follow up 12/20/15 - pt continued to have memory issues, not remember things with behavior changes since stroke. Pt seems more apathic, not compliant with medication, not refill her medication, not check BP or glucose at home and lack of activity at home. Today BP 178/86. 30 day cardiac event monitoring negative for afib. TCD MES suboptimal but negative for microemboli.   Follow-up 03/17/2016 - pt continued to be noncompliance. She did not take BP at home, nor checking glucose. Her medication ran out and has not picked up at pharmacy yet. She did not call back for diabetic education calls. She did not do much healthy diet and home exercise yet. She has not followed up with PCP. Her BP today 165/81.   Admission 05/18/2016 - patient was admitted due to acute confusion and worsening memory  loss. MRI showed right small thalamic infarct, superior than the first infarct. CUS and TTE unremarkable, LDL 83 and A1c 11.1. She had again diabetic education, and put on DAPT and continue on Lipitor 80 on discharge.  05/22/16 follow up - patient is stable, however, had profound short-term memory loss. Not able to remember 3 words given 1 minute ago. Family complains of memory loss causing dysfunction at home and at work. Occasionally lost direction during driving. BP super high today 238/130. Noncompliant diabetic diet and BP/glucose monitoring at home.  06/19/16 admission - for right hemiparesis and gait instability. MRI showed again small acute infarcts at left PLIC, still consistent with small vessel disease. MRA showed moderate proximal R VA stenosis. LDL 62 and A1C 7.2. She was continued on DAPT and lipitor on discharge with outpt PT/OT.   08/03/16 follow up - pt has been following with PT/OT and had improvement. Currently PT/OT twice a week. BP and glucose controlled well as per daughter. Lost 10lbs for the last 2 weeks. Still on aricept at 10mg . As per pt daughter, confusion is gone but still has short memory issue at home. Pt still has intermittent crying spells because she is worrying about her bills as she is not working now. BP today 125/70.   12/04/16 follow up - pt has been doing better. No recurrent stroke. And she lost 60 lbs. She stated that she ate less as she does not have much appetite. Her BP much better and actually  was low, her BP meds were cut in half. Her glucose also at lower side, and she is off insulin, on glyxambi and metformin. She has depressed mood and was recently put on Vortioxetine. Had mechanical fall on 11/25/16 with right hip and right knee pain but no fracture or hitting head. 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 BP today 118/69.  Interval History During the interval time, pt has been doing well. She has lost 70lbs now and feels good. She is able to walk with cane. Her  right hand weakness much improved and her right HH improved to only right upper quadrantanopia. BP 119/79 today and glucose controlled well at home. She compliant with medication now. Follows with eye doctor for visual field monitoring.   REVIEW OF SYSTEMS: Full 14 system review of systems performed and notable only for those listed below and in HPI above, all others are negative:  Constitutional:   Cardiovascular:  Ear/Nose/Throat:   Skin:  Eyes:   Respiratory:   Gastroitestinal:   Genitourinary:  Hematology/Lymphatic:   Endocrine:  Musculoskeletal:   walking difficulty Allergy/Immunology:   Neurological:  weakness Psychiatric:  Sleep:   The following represents the patient's updated allergies and side effects list: Allergies  Allergen Reactions  . Glyxambi [Empagliflozin-Linagliptin] Nausea And Vomiting    Makes PT VERY SICK  . Lisinopril Cough    The neurologically relevant items on the patient's problem list were reviewed on today's visit.  Neurologic Examination  A problem focused neurological exam (12 or more points of the single system neurologic examination, vital signs counts as 1 point, cranial nerves count for 8 points) was performed.  Blood pressure 119/76, pulse 70, weight 170 lb 6.4 oz (77.3 kg).  General - obese, well developed, in no apparent distress.  Ophthalmologic - Fundi not visualized due to eye movement.  Cardiovascular - Regular rate and rhythm.  Mental Status -  Level of arousal and orientation to year, place, and person were intact, but not orientated to month. Language including expression, naming, repetition, comprehension was assessed and found intact. Able to spell backward, but not able to calculate. Registration 3/3 but delayed recall 0/3.  Cranial Nerves II - XII - II - Visual field test showed right upper quadrantanopia. III, IV, VI - Extraocular movements intact. V - Facial sensation intact bilaterally. VII - Facial movement intact  bilaterally. VIII - Hearing & vestibular intact bilaterally. X - Palate elevates symmetrically. XI - Chin turning & shoulder shrug intact bilaterally. XII - Tongue protrusion intact.  Motor Strength - The patient's strength was RUE 5-/5 with pronator drift and slight difficulty with dexterity, RLE 4+/5 walking with cane. 5/5 LUE and LLE.  Bulk was normal and fasciculations were absent.   Motor Tone - Muscle tone was assessed at the neck and appendages and was normal.  Reflexes - The patient's reflexes were 1+ in all extremities and she had no pathological reflexes.  Sensory - Light touch, temperature/pinprick were assessed and were normal.    Coordination - The patient had normal movements in the hands with no ataxia or dysmetria.  Tremor was absent.  Gait and Station - walk with cane and right mild hemiparetic gait.  Data reviewed: I personally reviewed the images and agree with the radiology interpretations.  Ct Head Wo Contrast 08/07/2015  Focal low density in the left frontal lobe white matter which may be in the corpus callosum. Acute infarct is not excluded.   Mr [name] Head/brain Wo Cm 08/07/2015  1. Multiple  small, acute left ACA territory infarcts involving the corpus callosum, cingulate gyrus, and superior left frontal lobe.  2. Small acute infarct involving the anteroinferior right thalamus/cerebral peduncle.  3. Moderately to severely motion degraded head MRA without evidence of large vessel occlusion or flow limiting proximal stenosis. Suggestion of A2 ACA segmental narrowing.   CTA head  08/10/2015 1. Infarcts involving the right hypothalamus, anterior genu of corpus callosum on the left, left distal ACA distribution. 2. A high-grade stenosis in proximal right A2 segment and more distal left A3 segment, within the left pericallosal artery. 3. Diffuse attenuation of distal ACA branch vessels, worse on the left. 4. Moderate proximal more mild distal left M1 segment  stenoses. 5. Moderate proximal left M2 stenoses beyond the bifurcation. 6. Mild attenuation of distal MCA branch vessels bilaterally. 7. Moderate proximal PCA stenoses bilaterally with significant attenuation of distal small vessels.  Carotid Doppler 1-39% ICA plaquing. Vertebral artery flow is antegrade.  2D Echocardiogram  - Left ventricle: The cavity size was normal. There was mildconcentric hypertrophy. Systolic function was normal. Theestimated ejection fraction was in the range of 55% to 60%. Wallmotion was normal; there were no regional wall motionabnormalities. - Left atrium: The atrium was mildly dilated. - Atrial septum: No defect or patent foramen ovale was identified.  LE venous dopplers negative for DVT  TEE  1. No LAA thrombus 2. Negative for PFO 3. No significant valvular disease 4. LVEF 55-60%  30 day cardiac monitoring - negative for afib  TCD MES - suboptimal study, no MES seen.   Mri & Mra Brain Wo Contrast (neuro Protocol) 05/18/2016 New area of acute infarction, medial RIGHT hemisphere affecting anterior-most thalamus as well as the genu of the internal capsule. It is unclear if this involves the anterior choroidal artery territory, or medial thalamoperforate territory. MCA lenticulostriate territory involvement is unlikely. Chronic changes as described, with resolved predominantly left-sided infarcts in 2016. Similar pattern of intracranial atherosclerosis when compared with prior CTA from 08/10/2015.   Carotid Doppler  05/19/16 There is 1-39% bilateral ICA stenosis. Vertebral artery flow is antegrade.    2D Echocardiogram 05/19/16 - Left ventricle: The cavity size was normal. Wall thickness wasincreased in a pattern of moderate LVH. There was mild concentrichypertrophy. Systolic function was normal. The estimated ejectionfraction was in the range of 60% to 65%. Wall motion was normal;there were no regional wall motion abnormalities. Dopplerparameters  are consistent with abnormal left ventricularrelaxation (grade 1 diastolic dysfunction). The E/e&' ratio isbetween 8-15, suggesting indeterminate LV filling pressure. - Left atrium: Moderately dilated. - Right atrium: The atrium was at the upper limits of normal insize. - Inferior vena cava: The vessel was normal in size. Therespirophasic diameter changes were in the normal range (>= 50%),consistent with normal central venous pressure. Impressions:    Compared to a prior study in 2016, there is now mild LVH, LVEF60-65%, diastolic dysfunction and moderate left atrialenlargement.  [facility] 06/19/2016 1. Small acute infarcts primarily involving the posterior limb of the left internal capsule.  2. Unchanged chronic infarcts as above.  3. Unchanged advanced intracranial atherosclerosis. No major vessel occlusion.  4. No cervical carotid artery stenosis.  5. Widely patent and strongly dominant left vertebral artery.  6. Moderate proximal right vertebral artery stenosis.    CSF Component     Latest Ref Rng 08/11/2015         2:56 PM  Tube #      1  Color, CSF  COLORLESS COLORLESS  Appearance, CSF     CLEAR CLEAR  Supernatant      NOT INDICATED  RBC Count, CSF     0 /cu mm 48 (H)  WBC, CSF     0 - 5 /cu mm 1  Other Cells, CSF      TOO FEW TO COUNT, SMEAR AVAILABLE FOR REVIEW  Specimen Description        Special Requests        Gram Stain        Culture        Report Status        Fungal Smear        Crypto Ag     NEGATIVE NEGATIVE  Cryptococcal Ag Titer     NOT INDICATED NOT INDICATED  Glucose, CSF     40 - 70 mg/dL 87 (H)  Total  Protein, CSF     15 - 45 mg/dL 21  VDRL Quant, CSF     Non Rea:<1:1 Non Reactive  CSF Oligoclonal Bands      Comment   Component     Latest Ref Rng 08/11/2015         2:57 PM  Tube #        Color, CSF     COLORLESS   Appearance, CSF     CLEAR   Supernatant        RBC Count, CSF     0 /cu mm     WBC, CSF     0 - 5 /cu mm   Other Cells, CSF        Specimen Description      CSF  Special Requests      NONE  Gram Stain      WBC PRESENT, PREDOMINANTLY MONONUCLEAR . . .  Culture      NO GROWTH 3 DAYS  Report Status      08/14/2015 FINAL  Fungal Smear        Crypto Ag     NEGATIVE   Cryptococcal Ag Titer     NOT INDICATED   Glucose, CSF     40 - 70 mg/dL   Total  Protein, CSF     15 - 45 mg/dL   VDRL Quant, CSF     Non Rea:<1:1   CSF Oligoclonal Bands         Component     Latest Ref Rng 08/11/2015         2:58 PM  Tube #        Color, CSF     COLORLESS   Appearance, CSF     CLEAR   Supernatant        RBC Count, CSF     0 /cu mm   WBC, CSF     0 - 5 /cu mm   Other Cells, CSF        Specimen Description      CSF  Special Requests      NONE  Gram Stain        Culture      No Fungi Isolated in 4 Weeks . . .  Report Status      09/08/2015 FINAL  Fungal Smear      NO YEAST OR FUNGAL ELEMENTS SEEN . . .  Crypto Ag     NEGATIVE   Cryptococcal Ag Titer     NOT INDICATED   Glucose, CSF     40 - 70  mg/dL   Total  Protein, CSF     15 - 45 mg/dL   VDRL Quant, CSF     Non Rea:<1:1   CSF Oligoclonal Bands         Component     Latest Ref Rng 08/08/2015 08/10/2015 08/11/2015  Cholesterol     0 - 200 mg/dL 956 (H)    Triglycerides     <150 mg/dL 213    HDL Cholesterol     >40 mg/dL 43    Total CHOL/HDL Ratio      4.8    VLDL     0 - 40 mg/dL 25    LDL (calc)     0 - 99 mg/dL 086 (H)    PTT Lupus Anticoagulant     0.0 - 40.6 sec  35.4   DRVVT     0.0 - 44.0 sec  34.0   Lupus Anticoag Interp       Comment:   Anticardiolipin IgG     0 - 14 GPL U/mL  <9   Anticardiolipin IgM     0 - 12 MPL U/mL  <9   Anticardiolipin IgA     0 - 11 APL U/mL  <9   Beta-2 Glyco I IgG     0 - 20 GPI IgG units  <9   Beta-2-Glycoprotein I IgM     0 - 32 GPI IgM units  <9   Beta-2-Glycoprotein I IgA     0 - 25 GPI IgA units  <9   C-ANCA     Neg:<1:20  titer  <1:20   P-ANCA     Neg:<1:20 titer  <1:20   Atypical P-ANCA titer     Neg:<1:20 titer  <1:20   Hemoglobin A1C     4.8 - 5.6 % 7.2 (H)    Mean Plasma Glucose      160    Recommendations-PTGENE:       Comment   Additional Information       Comment   Source Varicella-Zoster, PCR        BLOOD  Varicella-Zoster, PCR     Negative   Negative  ANA Ab, IFA       Negative   C3 Complement     82 - 167 mg/dL  578   Complement C4, Body Fluid     14 - 44 mg/dL  31   Compl, Total (IO96)     42 - 60 U/mL  > 60   HIV     Non Reactive  Non Reactive   Antithrombin Activity     75 - 120 %  103   Protein C Activity     73 - 180 %  152   Protein C, Total     60 - 150 %  131   Protein S Activity     63 - 140 %  85   Protein S Ag, Total     60 - 150 %  110   Homocysteine     0.0 - 15.0 umol/L  7.4   RPR     Non Reactive  Non Reactive   Sed Rate     0 - 22 mm/hr  46 (H)   ds DNA Ab     0 - 9 IU/mL  <1   SSA (Ro) (ENA) Antibody, IgG     0.0 - 0.9 AI  <0.2   SSB (La) (ENA) Antibody, IgG     0.0 -  0.9 AI  <0.2   Varicella     Immune >165 index  2055    Component     Latest Ref Rng & Units 06/19/2016 06/20/2016  Cholesterol     0 - 200 mg/dL  161  Triglycerides     <150 mg/dL  096  HDL Cholesterol     >40 mg/dL  28 (L)  Total CHOL/HDL Ratio     RATIO  4.1  VLDL     0 - 40 mg/dL  26  LDL (calc)     0 - 99 mg/dL  62  Hemoglobin E4V     4.8 - 5.6 %  7.2 (H)  Mean Plasma Glucose     mg/dL  409  TSH     8.119 - 1.478 uIU/mL 1.171     Assessment: As you may recall, she is a 51 y.o. African American female with PMH of DM, HTN, obesity, HLD was admitted on 08/07/15 for AMS and amnesia. 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 MRA and CTA showed left ACA, right ACA, left M1, M2, and b/l PCA mild to moderate stenosis. Had LP and negative for vasculitis. CUS and TTE as well as TEE were unremarkable. Hypercoagulable  and autoimmue work up negative. LDL 138 and A1C 7.2. Her symptoms resolved and she was discharged with ASA and lipitor. 30 day cardiac monitoring and TCD MES all negative. Pt declined RESPECT ESUS. Readmitted on 05/18/16 due to confusion and profound memory loss, found to have right thalamic small infarct superior to prior infarct. MRA unchanged from prior CTA. CUS and TTE unremarkable, LDL 83 and A1c 11.1. She was discharged on DAPT and continued with high-dose Lipitor. Admitted again on 06/19/16 for small acute infarcts at left PLIC, still consistent with small vessel disease. LDL 62 and A1C 7.2. She was continued on DAPT and lipitor on discharge with outpt PT/OT. On aricept at 10mg  for short-term memory loss. Overtime, her BP and glucose under control, lost weight. Right HH improved to upper quadrantanopia, right hand dexterity also improved, now walk with cane.    Plan:  - continue ASA and plavix and lipitor for stroke prevention. Will consider to discontinue ASA next visit. - continue aricept for short memory loss. - close check BP and glucose at home - continue follow up with ophthalmology for visual field monitoring. - continue home self exercise - Follow up with your primary care physician for stroke risk factor modification. Recommend maintain blood pressure goal <140/80, diabetes with hemoglobin A1c goal below 7.0% and lipids with LDL cholesterol goal below 70 mg/dL.  - diabetic diet and lose weight.  - follow up in 6 months.  I spent more than 25 minutes of face to face time with the patient. Greater than 50% of time was spent in counseling and coordination of care. We have discussed about BP and glucose monitoring, self exercise and compliance with medication.    No orders of the defined types were placed in this encounter.   No orders of the defined types were placed in this encounter.   Patient Instructions  - continue ASA and plavix and lipitor for stroke prevention. Will consider to  discontinue ASA next visit. - continue aricept for short memory loss. - close check BP and glucose at home - continue follow up with eye doctor for visual field monitoring. - continue home self exercise - Follow up with your primary care physician for stroke risk factor  modification. Recommend maintain blood pressure goal <140/80, diabetes with hemoglobin A1c goal below 7.0% and lipids with LDL cholesterol goal below 70 mg/dL.  - diabetic diet and lose weight.  - continue trintellix for depression  - recommend B12 supplement such as 500 microgram daily  - follow up in 6 months.   Marvel Plan, MD PhD [facility] [address] [place], [place] [address] [phone]

## 2017-05-02 DIAGNOSIS — Z8673 Personal history of transient ischemic attack (TIA), and cerebral infarction without residual deficits: Secondary | ICD-10-CM | POA: Diagnosis not present

## 2017-05-02 DIAGNOSIS — E118 Type 2 diabetes mellitus with unspecified complications: Secondary | ICD-10-CM | POA: Diagnosis not present

## 2017-05-02 DIAGNOSIS — I119 Hypertensive heart disease without heart failure: Secondary | ICD-10-CM | POA: Diagnosis not present

## 2017-05-18 ENCOUNTER — Other Ambulatory Visit: Payer: Self-pay | Admitting: Physician Assistant

## 2017-05-21 NOTE — Telephone Encounter (Signed)
Letter mailed to patient about making an appointment for more medication refills. °

## 2017-07-04 DIAGNOSIS — E119 Type 2 diabetes mellitus without complications: Secondary | ICD-10-CM | POA: Diagnosis not present

## 2017-07-21 ENCOUNTER — Other Ambulatory Visit: Payer: Self-pay | Admitting: Neurology

## 2017-07-21 DIAGNOSIS — R413 Other amnesia: Secondary | ICD-10-CM

## 2017-07-23 ENCOUNTER — Other Ambulatory Visit: Payer: Self-pay

## 2017-07-23 DIAGNOSIS — R413 Other amnesia: Secondary | ICD-10-CM

## 2017-07-23 MED ORDER — DONEPEZIL HCL 10 MG PO TABS: 10.0000 mg | ORAL_TABLET | Freq: Every day | ORAL | 3 refills | Status: AC

## 2017-08-16 DIAGNOSIS — I10 Essential (primary) hypertension: Secondary | ICD-10-CM | POA: Diagnosis not present

## 2017-09-12 DIAGNOSIS — E119 Type 2 diabetes mellitus without complications: Secondary | ICD-10-CM | POA: Diagnosis not present

## 2017-09-12 DIAGNOSIS — I119 Hypertensive heart disease without heart failure: Secondary | ICD-10-CM | POA: Diagnosis not present

## 2017-09-24 ENCOUNTER — Ambulatory Visit: Payer: 59 | Admitting: Neurology

## 2017-09-26 ENCOUNTER — Ambulatory Visit: Payer: 59 | Admitting: Neurology

## 2017-09-26 ENCOUNTER — Encounter (INDEPENDENT_AMBULATORY_CARE_PROVIDER_SITE_OTHER): Payer: Self-pay

## 2017-09-26 ENCOUNTER — Encounter: Payer: Self-pay | Admitting: Neurology

## 2017-09-26 VITALS — BP 126/67 | HR 75 | Wt 187.0 lb

## 2017-09-26 DIAGNOSIS — I679 Cerebrovascular disease, unspecified: Secondary | ICD-10-CM

## 2017-09-26 DIAGNOSIS — I6330 Cerebral infarction due to thrombosis of unspecified cerebral artery: Secondary | ICD-10-CM

## 2017-09-26 DIAGNOSIS — E1159 Type 2 diabetes mellitus with other circulatory complications: Secondary | ICD-10-CM | POA: Diagnosis not present

## 2017-09-26 DIAGNOSIS — E785 Hyperlipidemia, unspecified: Secondary | ICD-10-CM

## 2017-09-26 DIAGNOSIS — I10 Essential (primary) hypertension: Secondary | ICD-10-CM | POA: Diagnosis not present

## 2017-09-26 NOTE — Patient Instructions (Addendum)
-   continueplavix and lipitor for stroke prevention.  - discontinue ASA  - continue aricept for short memory loss. - close check BP and glucose at home - Follow up with your primary care physician for stroke risk factor modification. Recommend maintain blood pressure goal <140/80, diabetes with hemoglobin A1c goal below 7.0% and lipids with LDL cholesterol goal below 70 mg/dL.  - diabetic diet and home exercise - avoid over exertion, stress, or infection.  - use cane for walking and avoid fall - follow up in 6 months

## 2017-09-26 NOTE — Progress Notes (Signed)
STROKE NEUROLOGY FOLLOW UP NOTE  NAME: Annette Munoz DOB: 04/14/1966  REASON FOR VISIT: stroke follow up HISTORY FROM: pt and chart  Today we had the pleasure of seeing Annette SanerBetty H Forrest in follow-up at our [facility]. Pt was accompanied by husband and daughter.   History Summary [name] is a 51 y.o. female with history of DM, HTN, obesity, HLD was admitted on 08/07/15 for AMS and amnesia. 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 MRA and CTA showed left ACA, right ACA, left M1, M2, and b/l PCA mild to moderate stenosis. Had LP and negative for vasculitis. CUS and TTE as well as TEE were unremarkable. Hypercoagulable and autoimmue work up negative. LDL 138 and A1C 7.2. Her symptoms resolved and she was discharged with ASA and lipitor.  09/30/15 follow up - the patient has been doing well. No recurrent stroke like symptoms. However, she did not check BP at home and her glucometer does not work. BP today 185/82. As per husband, she still has short term memory deficit. Pt not sure if she is on lisinopril which is on her medication list.  Follow up 12/20/15 - pt continued to have memory issues, not remember things with behavior changes since stroke. Pt seems more apathic, not compliant with medication, not refill her medication, not check BP or glucose at home and lack of activity at home. Today BP 178/86. 30 day cardiac event monitoring negative for afib. TCD MES suboptimal but negative for microemboli.   Follow-up 03/17/2016 - pt continued to be noncompliance. She did not take BP at home, nor checking glucose. Her medication ran out and has not picked up at pharmacy yet. She did not call back for diabetic education calls. She did not do much healthy diet and home exercise yet. She has not followed up with PCP. Her BP today 165/81.   Admission 05/18/2016 - patient was admitted due to acute confusion and worsening memory  loss. MRI showed right small thalamic infarct, superior than the first infarct. CUS and TTE unremarkable, LDL 83 and A1c 11.1. She had again diabetic education, and put on DAPT and continue on Lipitor 80 on discharge.  05/22/16 follow up - patient is stable, however, had profound short-term memory loss. Not able to remember 3 words given 1 minute ago. Family complains of memory loss causing dysfunction at home and at work. Occasionally lost direction during driving. BP super high today 238/130. Noncompliant diabetic diet and BP/glucose monitoring at home.  06/19/16 admission - for right hemiparesis and gait instability. MRI showed again small acute infarcts at left PLIC, still consistent with small vessel disease. MRA showed moderate proximal R VA stenosis. LDL 62 and A1C 7.2. She was continued on DAPT and lipitor on discharge with outpt PT/OT.   08/03/16 follow up - pt has been following with PT/OT and had improvement. Currently PT/OT twice a week. BP and glucose controlled well as per daughter. Lost 10lbs for the last 2 weeks. Still on aricept at 10mg . As per pt daughter, confusion is gone but still has short memory issue at home. Pt still has intermittent crying spells because she is worrying about her bills as she is not working now. BP today 125/70.   12/04/16 follow up - pt has been doing better. No recurrent stroke. And she lost 60 lbs. She stated that she ate less as she does not have much appetite. Her BP much better and actually  was low, her BP meds were cut in half. Her glucose also at lower side, and she is off insulin, on glyxambi and metformin. She has depressed mood and was recently put on Vortioxetine. Had mechanical fall on 11/25/16 with right hip and right knee pain but no fracture or hitting head. 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 BP today 118/69.  04/10/17 follow-up - pt has been doing well. She has lost 70lbs now and feels good. She is able to walk with cane. Her right hand weakness much  improved and her right HH improved to only right upper quadrantanopia. BP 119/79 today and glucose controlled well at home. She compliant with medication now. Follows with eye doctor for visual field monitoring.   Interval History During the interval time, patient has been doing the same.  Weight has been stabilized.  BP and glucose controlled well at home.  However, patient sometimes craving for sweets.  Right hand dexterity difficulty much improved.  Sometimes right hip pain with certain positions.  Husband continued to complain of memory difficulties, not able to remember taking medication, disorientated about time and place, not remember since happened not long ago.  BP today 126/67.   REVIEW OF SYSTEMS: Full 14 system review of systems performed and notable only for those listed below and in HPI above, all others are negative:  Constitutional:   Cardiovascular:  Ear/Nose/Throat:   Skin:  Eyes:   Respiratory:   Gastroitestinal:   Genitourinary:  Hematology/Lymphatic:   Endocrine:  Musculoskeletal:    Allergy/Immunology:   Neurological:   Psychiatric:  Sleep:   The following represents the patient's updated allergies and side effects list: Allergies  Allergen Reactions  . Glyxambi [Empagliflozin-Linagliptin] Nausea And Vomiting    Makes PT VERY SICK  . Lisinopril Cough    The neurologically relevant items on the patient's problem list were reviewed on today's visit.  Neurologic Examination  A problem focused neurological exam (12 or more points of the single system neurologic examination, vital signs counts as 1 point, cranial nerves count for 8 points) was performed.  Blood pressure 126/67, pulse 75, weight 187 lb (84.8 kg).  General - obese, well developed, in no apparent distress.  Ophthalmologic - Fundi not visualized due to eye movement.  Cardiovascular - Regular rate and rhythm.  Mental Status -  Level of arousal and orientation to year, place, and person were  intact, but not orientated to month. Language including expression, naming, repetition, comprehension was assessed and found intact. Able to spell backward, but not able to calculate. Registration 3/3 but delayed recall 0/3.  Cranial Nerves II - XII - II - Visual field test with continued right upper quadrantanopia. III, IV, VI - Extraocular movements intact. V - Facial sensation intact bilaterally. VII - Facial movement intact bilaterally. VIII - Hearing & vestibular intact bilaterally. X - Palate elevates symmetrically. XI - Chin turning & shoulder shrug intact bilaterally. XII - Tongue protrusion intact.  Motor Strength - The patient's strength was normal bilaterally except subtle right hand dexterity difficulty.  Bulk was normal and fasciculations were absent.   Motor Tone - Muscle tone was assessed at the neck and appendages and was normal.  Reflexes - The patient's reflexes were 1+ in all extremities and she had no pathological reflexes.  Sensory - Light touch, temperature/pinprick were assessed and were normal.    Coordination - The patient had normal movements in the hands with no ataxia or dysmetria.  Tremor was absent.  Gait and Station -  walk with cane and right subtle hemiparetic gait.  Data reviewed: I personally reviewed the images and agree with the radiology interpretations.  Ct Head Wo Contrast 08/07/2015  Focal low density in the left frontal lobe white matter which may be in the corpus callosum. Acute infarct is not excluded.   Mr [name] Head/brain Wo Cm 08/07/2015  1. Multiple small, acute left ACA territory infarcts involving the corpus callosum, cingulate gyrus, and superior left frontal lobe.  2. Small acute infarct involving the anteroinferior right thalamus/cerebral peduncle.  3. Moderately to severely motion degraded head MRA without evidence of large vessel occlusion or flow limiting proximal stenosis. Suggestion of A2 ACA segmental narrowing.    CTA head  08/10/2015 1. Infarcts involving the right hypothalamus, anterior genu of corpus callosum on the left, left distal ACA distribution. 2. A high-grade stenosis in proximal right A2 segment and more distal left A3 segment, within the left pericallosal artery. 3. Diffuse attenuation of distal ACA branch vessels, worse on the left. 4. Moderate proximal more mild distal left M1 segment stenoses. 5. Moderate proximal left M2 stenoses beyond the bifurcation. 6. Mild attenuation of distal MCA branch vessels bilaterally. 7. Moderate proximal PCA stenoses bilaterally with significant attenuation of distal small vessels.  Carotid Doppler 1-39% ICA plaquing. Vertebral artery flow is antegrade.  2D Echocardiogram  - Left ventricle: The cavity size was normal. There was mildconcentric hypertrophy. Systolic function was normal. Theestimated ejection fraction was in the range of 55% to 60%. Wallmotion was normal; there were no regional wall motionabnormalities. - Left atrium: The atrium was mildly dilated. - Atrial septum: No defect or patent foramen ovale was identified.  LE venous dopplers negative for DVT  TEE  1. No LAA thrombus 2. Negative for PFO 3. No significant valvular disease 4. LVEF 55-60%  30 day cardiac monitoring - negative for afib  TCD MES - suboptimal study, no MES seen.   Mri & Mra Brain Wo Contrast (neuro Protocol) 05/18/2016 New area of acute infarction, medial RIGHT hemisphere affecting anterior-most thalamus as well as the genu of the internal capsule. It is unclear if this involves the anterior choroidal artery territory, or medial thalamoperforate territory. MCA lenticulostriate territory involvement is unlikely. Chronic changes as described, with resolved predominantly left-sided infarcts in 2016. Similar pattern of intracranial atherosclerosis when compared with prior CTA from 08/10/2015.   Carotid Doppler  05/19/16 There is 1-39% bilateral ICA  stenosis. Vertebral artery flow is antegrade.    2D Echocardiogram 05/19/16 - Left ventricle: The cavity size was normal. Wall thickness wasincreased in a pattern of moderate LVH. There was mild concentrichypertrophy. Systolic function was normal. The estimated ejectionfraction was in the range of 60% to 65%. Wall motion was normal;there were no regional wall motion abnormalities. Dopplerparameters are consistent with abnormal left ventricularrelaxation (grade 1 diastolic dysfunction). The E/e&' ratio isbetween 8-15, suggesting indeterminate LV filling pressure. - Left atrium: Moderately dilated. - Right atrium: The atrium was at the upper limits of normal insize. - Inferior vena cava: The vessel was normal in size. Therespirophasic diameter changes were in the normal range (>= 50%),consistent with normal central venous pressure. Impressions:    Compared to a prior study in 2016, there is now mild LVH, LVEF60-65%, diastolic dysfunction and moderate left atrialenlargement.  Mr [facility] Neck Wo Contrast 06/19/2016 1. Small acute infarcts primarily involving the posterior limb of the left internal capsule.  2. Unchanged chronic infarcts as above.  3. Unchanged advanced intracranial atherosclerosis. No major vessel occlusion.  4. No cervical carotid artery stenosis.  5. Widely patent and strongly dominant left vertebral artery.  6. Moderate proximal right vertebral artery stenosis.    CSF Component     Latest Ref Rng 08/11/2015         2:56 PM  Tube #      1  Color, CSF     COLORLESS COLORLESS  Appearance, CSF     CLEAR CLEAR  Supernatant      NOT INDICATED  RBC Count, CSF     0 /cu mm 48 (H)  WBC, CSF     0 - 5 /cu mm 1  Other Cells, CSF      TOO FEW TO COUNT, SMEAR AVAILABLE FOR REVIEW  Specimen Description        Special Requests        Gram Stain        Culture        Report Status        Fungal Smear        Crypto Ag     NEGATIVE NEGATIVE   Cryptococcal Ag Titer     NOT INDICATED NOT INDICATED  Glucose, CSF     40 - 70 mg/dL 87 (H)  Total  Protein, CSF     15 - 45 mg/dL 21  VDRL Quant, CSF     Non Rea:<1:1 Non Reactive  CSF Oligoclonal Bands      Comment   Component     Latest Ref Rng 08/11/2015         2:57 PM  Tube #        Color, CSF     COLORLESS   Appearance, CSF     CLEAR   Supernatant        RBC Count, CSF     0 /cu mm   WBC, CSF     0 - 5 /cu mm   Other Cells, CSF        Specimen Description      CSF  Special Requests      NONE  Gram Stain      WBC PRESENT, PREDOMINANTLY MONONUCLEAR . . .  Culture      NO GROWTH 3 DAYS  Report Status      08/14/2015 FINAL  Fungal Smear        Crypto Ag     NEGATIVE   Cryptococcal Ag Titer     NOT INDICATED   Glucose, CSF     40 - 70 mg/dL   Total  Protein, CSF     15 - 45 mg/dL   VDRL Quant, CSF     Non Rea:<1:1   CSF Oligoclonal Bands         Component     Latest Ref Rng 08/11/2015         2:58 PM  Tube #        Color, CSF     COLORLESS   Appearance, CSF     CLEAR   Supernatant        RBC Count, CSF     0 /cu mm   WBC, CSF     0 - 5 /cu mm   Other Cells, CSF        Specimen Description      CSF  Special Requests      NONE  Gram Stain        Culture      No Fungi Isolated in  4 Weeks . . .  Report Status      09/08/2015 FINAL  Fungal Smear      NO YEAST OR FUNGAL ELEMENTS SEEN . . .  Crypto Ag     NEGATIVE   Cryptococcal Ag Titer     NOT INDICATED   Glucose, CSF     40 - 70 mg/dL   Total  Protein, CSF     15 - 45 mg/dL   VDRL Quant, CSF     Non Rea:<1:1   CSF Oligoclonal Bands         Component     Latest Ref Rng 08/08/2015 08/10/2015 08/11/2015  Cholesterol     0 - 200 mg/dL 161 (H)    Triglycerides     <150 mg/dL 096    HDL Cholesterol     >40 mg/dL 43    Total CHOL/HDL Ratio      4.8    VLDL     0 - 40 mg/dL 25    LDL (calc)     0 - 99 mg/dL 045 (H)    PTT Lupus Anticoagulant     0.0 - 40.6 sec   35.4   DRVVT     0.0 - 44.0 sec  34.0   Lupus Anticoag Interp       Comment:   Anticardiolipin IgG     0 - 14 GPL U/mL  <9   Anticardiolipin IgM     0 - 12 MPL U/mL  <9   Anticardiolipin IgA     0 - 11 APL U/mL  <9   Beta-2 Glyco I IgG     0 - 20 GPI IgG units  <9   Beta-2-Glycoprotein I IgM     0 - 32 GPI IgM units  <9   Beta-2-Glycoprotein I IgA     0 - 25 GPI IgA units  <9   C-ANCA     Neg:<1:20 titer  <1:20   P-ANCA     Neg:<1:20 titer  <1:20   Atypical P-ANCA titer     Neg:<1:20 titer  <1:20   Hemoglobin A1C     4.8 - 5.6 % 7.2 (H)    Mean Plasma Glucose      160    Recommendations-PTGENE:       Comment   Additional Information       Comment   Source Varicella-Zoster, PCR        BLOOD  Varicella-Zoster, PCR     Negative   Negative  ANA Ab, IFA       Negative   C3 Complement     82 - 167 mg/dL  409   Complement C4, Body Fluid     14 - 44 mg/dL  31   Compl, Total (WJ19)     42 - 60 U/mL  > 60   HIV     Non Reactive  Non Reactive   Antithrombin Activity     75 - 120 %  103   Protein C Activity     73 - 180 %  152   Protein C, Total     60 - 150 %  131   Protein S Activity     63 - 140 %  85   Protein S Ag, Total     60 - 150 %  110   Homocysteine     0.0 - 15.0 umol/L  7.4   RPR     Non Reactive  Non Reactive   Sed Rate     0 - 22 mm/hr  46 (H)   ds DNA Ab     0 - 9 IU/mL  <1   SSA (Ro) (ENA) Antibody, IgG     0.0 - 0.9 AI  <0.2   SSB (La) (ENA) Antibody, IgG     0.0 - 0.9 AI  <0.2   Varicella     Immune >165 index  2055    Component     Latest Ref Rng & Units 06/19/2016 06/20/2016  Cholesterol     0 - 200 mg/dL  409  Triglycerides     <150 mg/dL  811  HDL Cholesterol     >40 mg/dL  28 (L)  Total CHOL/HDL Ratio     RATIO  4.1  VLDL     0 - 40 mg/dL  26  LDL (calc)     0 - 99 mg/dL  62  Hemoglobin B1Y     4.8 - 5.6 %  7.2 (H)  Mean Plasma Glucose     mg/dL  782  TSH     9.562 - 1.308 uIU/mL 1.171     Assessment: As you may  recall, she is a 51 y.o. African American female with PMH of DM, HTN, obesity, HLD was admitted on 08/07/15 for AMS and amnesia. 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 MRA and CTA showed left ACA, right ACA, left M1, M2, and b/l PCA mild to moderate stenosis. Had LP and negative for vasculitis. CUS and TTE as well as TEE were unremarkable. Hypercoagulable and autoimmue work up negative. LDL 138 and A1C 7.2. Her symptoms resolved and she was discharged with ASA and lipitor. 30 day cardiac monitoring and TCD MES all negative. Pt declined RESPECT ESUS. Readmitted on 05/18/16 due to confusion and profound memory loss, found to have right thalamic small infarct superior to prior infarct. MRA unchanged from prior CTA. CUS and TTE unremarkable, LDL 83 and A1c 11.1. She was discharged on DAPT and continued with high-dose Lipitor. Admitted again on 06/19/16 for small acute infarcts at left PLIC, still consistent with small vessel disease. LDL 62 and A1C 7.2. She was continued on DAPT and lipitor on discharge with outpt PT/OT. On aricept at 10mg  for short-term memory difficulty. Overtime, her BP and glucose under control, lost weight. Right HH improved to upper quadrantanopia, right hand dexterity also improved, now walk with cane.    Plan:  - continue plavix and lipitor for stroke prevention.  - discontinue ASA  - continue aricept for short memory loss. - close check BP and glucose at home - Follow up with your primary care physician for stroke risk factor modification. Recommend maintain blood pressure goal <140/80, diabetes with hemoglobin A1c goal below 7.0% and lipids with LDL cholesterol goal below 70 mg/dL.  - diabetic diet and home exercise - avoid over exertion, stress, or infection.  - use cane for walking and avoid fall - follow up in 6 months with carolyn, if stable, DC from clinic.  I spent more than 25 minutes of face to face time with the  patient. Greater than 50% of time was spent in counseling and coordination of care. We have discussed about BP and glucose monitoring, discontinue aspirin and avoid overexertion.    No orders of the defined types were placed in this encounter.   No orders of the defined types were placed in this encounter.  Patient Instructions  - continueplavix and lipitor for stroke prevention.  - discontinue ASA  - continue aricept for short memory loss. - close check BP and glucose at home - Follow up with your primary care physician for stroke risk factor modification. Recommend maintain blood pressure goal <140/80, diabetes with hemoglobin A1c goal below 7.0% and lipids with LDL cholesterol goal below 70 mg/dL.  - diabetic diet and home exercise - avoid over exertion, stress, or infection.  - use cane for walking and avoid fall - follow up in 6 months   Marvel PlanJindong Wright Gravely, MD PhD [facility] [address], [address] [place], [name] [address] [phone]

## 2017-10-22 ENCOUNTER — Other Ambulatory Visit: Payer: Self-pay | Admitting: Neurology

## 2017-10-22 DIAGNOSIS — I63522 Cerebral infarction due to unspecified occlusion or stenosis of left anterior cerebral artery: Secondary | ICD-10-CM

## 2017-10-22 DIAGNOSIS — I10 Essential (primary) hypertension: Secondary | ICD-10-CM

## 2017-11-13 DIAGNOSIS — E118 Type 2 diabetes mellitus with unspecified complications: Secondary | ICD-10-CM | POA: Diagnosis not present

## 2017-11-13 DIAGNOSIS — E119 Type 2 diabetes mellitus without complications: Secondary | ICD-10-CM | POA: Diagnosis not present

## 2017-12-18 DIAGNOSIS — E118 Type 2 diabetes mellitus with unspecified complications: Secondary | ICD-10-CM | POA: Diagnosis not present

## 2017-12-18 DIAGNOSIS — I639 Cerebral infarction, unspecified: Secondary | ICD-10-CM | POA: Diagnosis not present

## 2018-03-21 IMAGING — MR MR HEAD W/O CM
9 of 11 series · 30 of 48 positions shown · non-contrast
Comparison: CT angiography of the head 08/10/2015. MRI and MRA
08/07/2015.

CLINICAL DATA: Confusion for 2 days. No other focal neuro deficits.

EXAM:
MRI HEAD WITHOUT CONTRAST
MRA HEAD WITHOUT CONTRAST
TECHNIQUE: Multiplanar, multiecho pulse sequences of the brain and surrounding
structures were obtained without intravenous contrast. Angiographic
images of the head were obtained using MRA technique without
contrast.

[Series 3: DWI · axial · 3.0mm · 0.94mm/px · z∈[-83,+59]mm · 7 of 99 slices shown (1 of 2)]
[im 1/99]
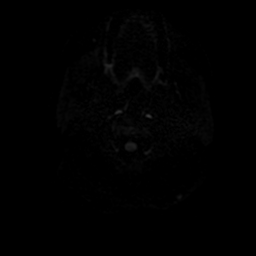
[im 17/99]
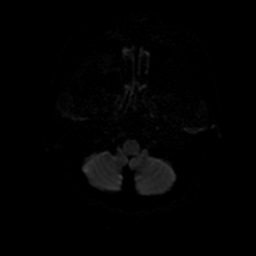
[im 33/99]
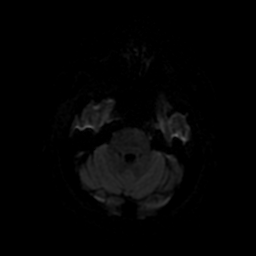
[im 50/99]
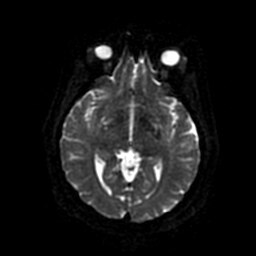
[im 66/99]
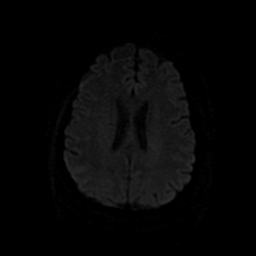
[im 82/99]
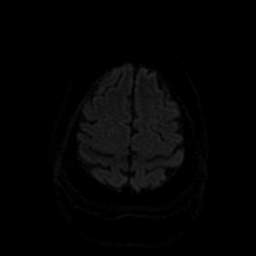
[im 99/99]
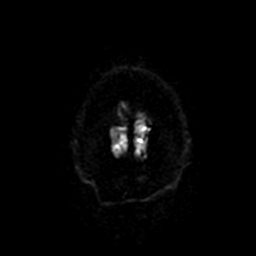

[Series 4: DWI · coronal · 4.0mm · 0.94mm/px · 5 of 64 slices shown (2 of 2)]
[im 1/64]
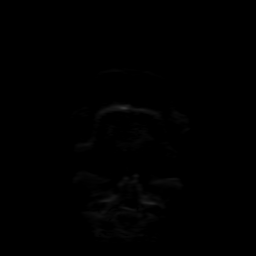
[im 16/64]
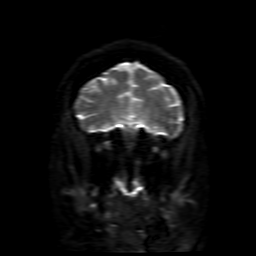
[im 32/64]
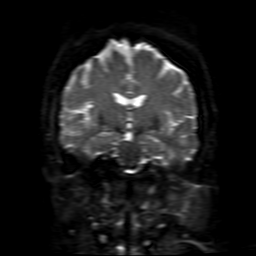
[im 48/64]
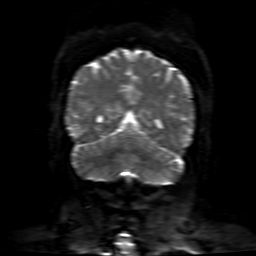
[im 64/64]
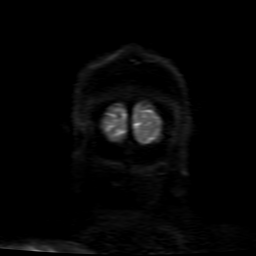

[Series 6: T2 · axial · 5.0mm · 0.47mm/px · z∈[-82,+57]mm · 2 of 25 slices shown (1 of 2)]
[im 1/25]
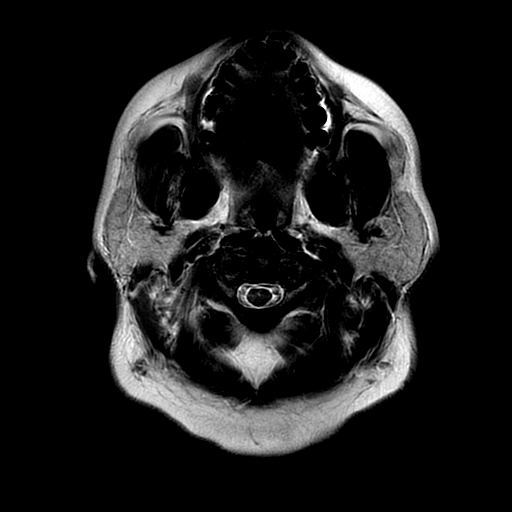
[im 25/25]
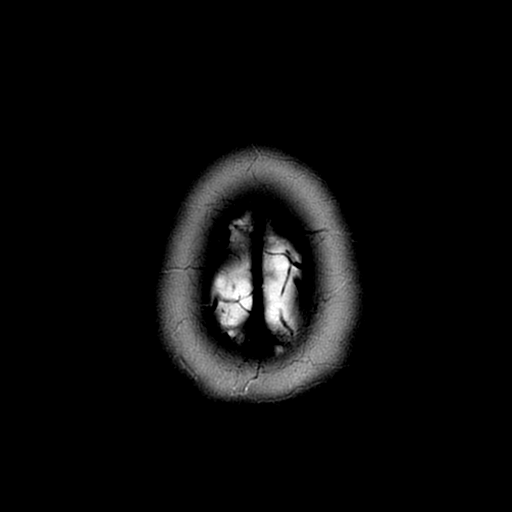

[Series 7: FLAIR · sagittal · 5.0mm · 0.47mm/px · 2 of 23 slices shown (1 of 2)]
[im 1/23]
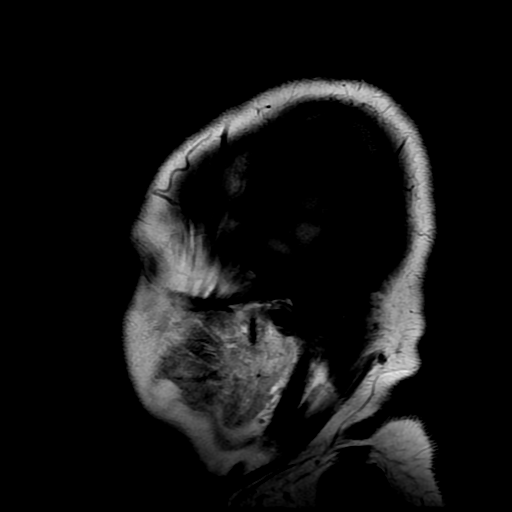
[im 23/23]
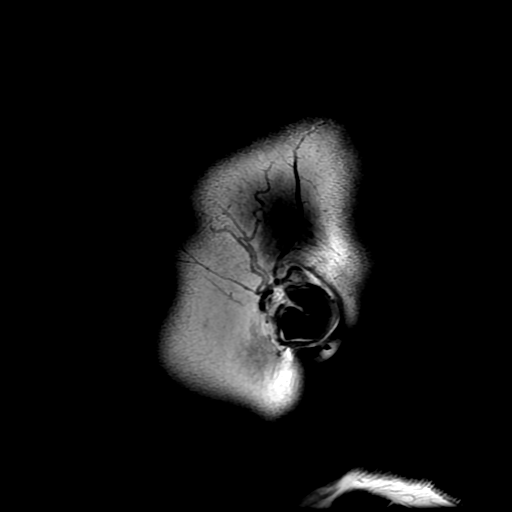

[Series 8: ax (id) 2 · axial · 1.0mm · 0.43mm/px · z∈[-75,-24]mm · 5 of 184 slices shown]
[im 1/184]
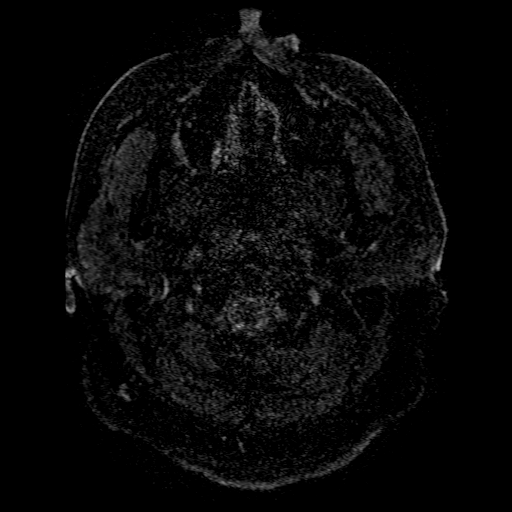
[im 31/184]
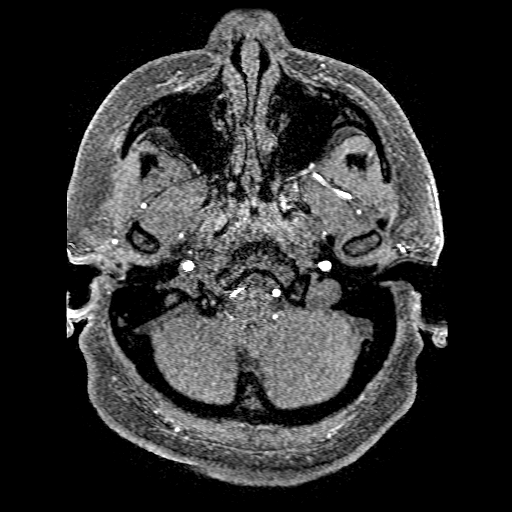
[im 62/184]
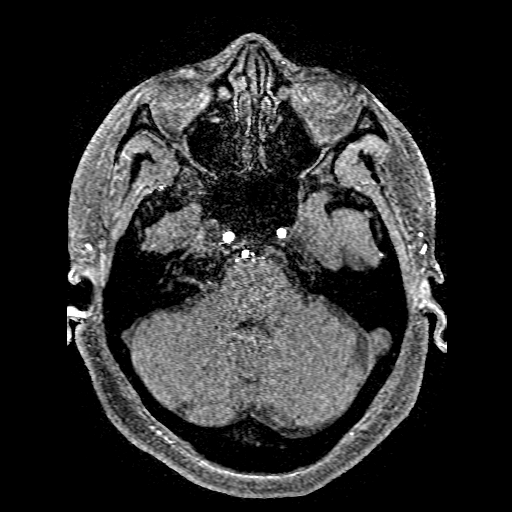
[im 77/184]
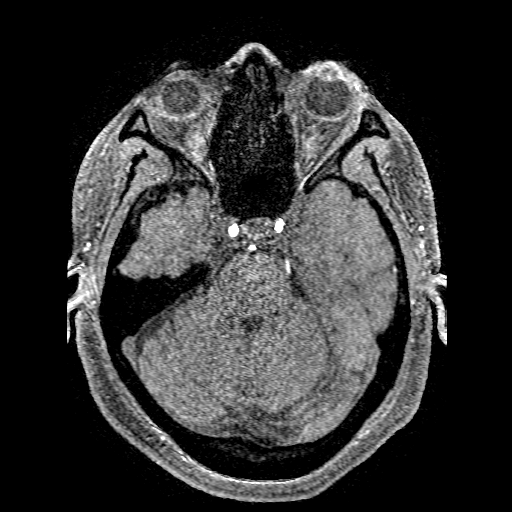
[im 107/184]
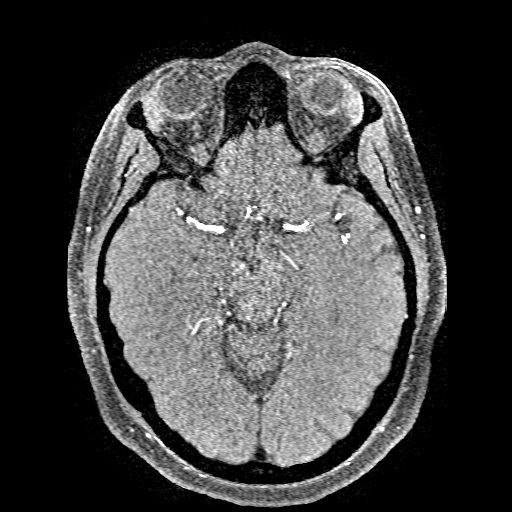

[Series 9: FLAIR · axial · 5.0mm · 0.47mm/px · z∈[-82,+57]mm · 2 of 25 slices shown (2 of 2)]
[im 1/25]
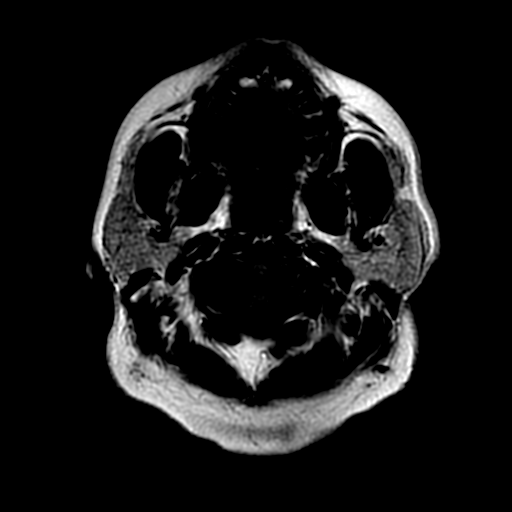
[im 25/25]
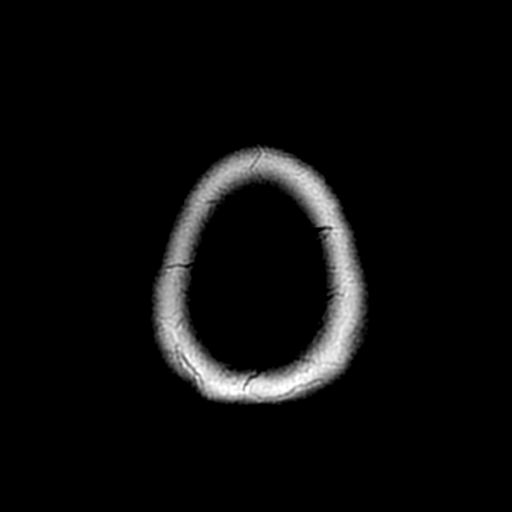

[Series 12: T2 · coronal · 5.0mm · 0.47mm/px · 1 of 18 slices shown (2 of 2)]
[im 1/18]
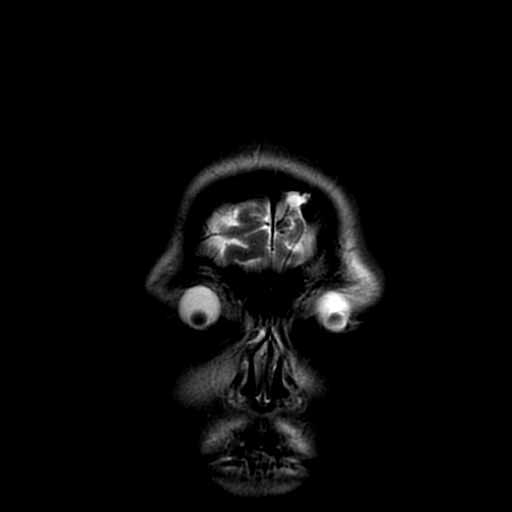

[Series 350: ADC · axial · 3.0mm · 0.94mm/px · z∈[-83,+59]mm · 4 of 50 slices shown (1 of 2)]
[im 1/50]
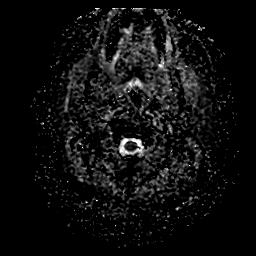
[im 17/50]
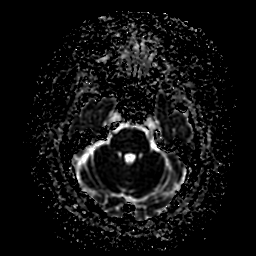
[im 33/50]
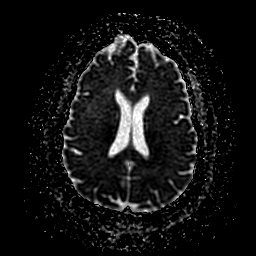
[im 50/50]
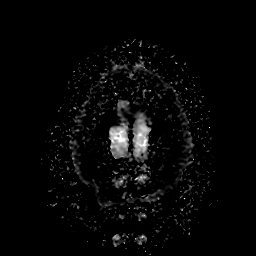

[Series 450: ADC · coronal · 4.0mm · 0.94mm/px · 2 of 33 slices shown (2 of 2)]
[im 1/33]
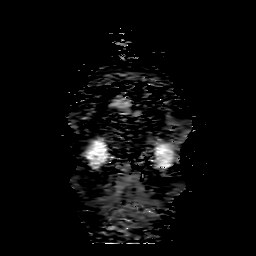
[im 33/33]
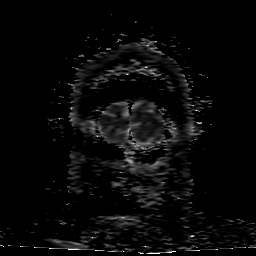

[30 of 48 positions shown; findings below may reference images not displayed]

FINDINGS: MRI HEAD FINDINGS

Since the previous study, there is a new area of acute infarction, 1
x 1 x 1.5 cm, involving the RIGHT hemisphere medial and anterior
most thalamus, as well as the genu of the RIGHT internal capsule
displaying restricted diffusion. No other areas of acute infarction
are seen.

No mass lesion, hydrocephalus, acute hemorrhage, or extra-axial
fluid.

Mild atrophy. Minor white matter disease. Chronic LEFT ACA territory
infarcts involving the corpus callosum anterior to the frontal horn,
and LEFT cingulate gyrus.

No midline abnormality. Flow voids are maintained. Extracranial soft
tissues unremarkable.

MRA HEAD FINDINGS

Both internal carotid arteries are patent, with no significant
irregularity on the RIGHT, and only mild irregularity on the LEFT,
possible 50% cavernous and supraclinoid stenosis. ICA termini
bilaterally are widely patent. On the RIGHT, the fifth A1 and M1
segments are widely patent on the LEFT, there is mild irregularity
of the proximal M1 segment with a 75% stenosis of the proximal LEFT
A1. In the LEFT anterior cerebral artery there is severe disease
distal to the callosum marginal branch. On the RIGHT there are
severe tandem stenoses in the A2 segments.

No MCA branch occlusion.

In the posterior circulation, the basilar is widely patent with LEFT
vertebral dominant. The RIGHT vertebral is patent and contributes
primarily to the RIGHT calf. Appear moderately diseased in their P2
segments, greater on the RIGHT. No definite cerebellar branch
occlusion.

No intracranial aneurysm.
IMPRESSION: New area of acute infarction, medial RIGHT hemisphere affecting
anterior-most thalamus as well as the genu of the internal capsule.
It is unclear if this involves the anterior choroidal artery
territory, or medial thalamoperforate territory. MCA
lenticulostriate territory involvement is unlikely.

Chronic changes as described, with resolved predominantly left-sided
infarcts in 7786.

Similar pattern of intracranial atherosclerosis when compared with
prior CTA from 08/10/2015.

## 2018-03-27 ENCOUNTER — Ambulatory Visit: Payer: 59 | Admitting: Nurse Practitioner

## 2018-03-27 DIAGNOSIS — I10 Essential (primary) hypertension: Secondary | ICD-10-CM | POA: Diagnosis not present

## 2018-03-27 DIAGNOSIS — N185 Chronic kidney disease, stage 5: Secondary | ICD-10-CM | POA: Diagnosis not present

## 2018-03-27 DIAGNOSIS — E119 Type 2 diabetes mellitus without complications: Secondary | ICD-10-CM | POA: Diagnosis not present

## 2018-06-10 DIAGNOSIS — E119 Type 2 diabetes mellitus without complications: Secondary | ICD-10-CM | POA: Diagnosis not present

## 2018-06-10 DIAGNOSIS — D649 Anemia, unspecified: Secondary | ICD-10-CM | POA: Diagnosis not present

## 2018-06-10 DIAGNOSIS — I639 Cerebral infarction, unspecified: Secondary | ICD-10-CM | POA: Diagnosis not present

## 2018-06-10 DIAGNOSIS — I119 Hypertensive heart disease without heart failure: Secondary | ICD-10-CM | POA: Diagnosis not present

## 2018-06-10 DIAGNOSIS — R413 Other amnesia: Secondary | ICD-10-CM | POA: Diagnosis not present

## 2018-09-18 DIAGNOSIS — R635 Abnormal weight gain: Secondary | ICD-10-CM | POA: Diagnosis not present

## 2018-09-18 DIAGNOSIS — I119 Hypertensive heart disease without heart failure: Secondary | ICD-10-CM | POA: Diagnosis not present

## 2018-09-18 DIAGNOSIS — E119 Type 2 diabetes mellitus without complications: Secondary | ICD-10-CM | POA: Diagnosis not present

## 2018-09-18 DIAGNOSIS — I639 Cerebral infarction, unspecified: Secondary | ICD-10-CM | POA: Diagnosis not present

## 2018-09-18 DIAGNOSIS — D649 Anemia, unspecified: Secondary | ICD-10-CM | POA: Diagnosis not present

## 2018-11-01 DIAGNOSIS — E118 Type 2 diabetes mellitus with unspecified complications: Secondary | ICD-10-CM | POA: Diagnosis not present

## 2018-11-01 DIAGNOSIS — I119 Hypertensive heart disease without heart failure: Secondary | ICD-10-CM | POA: Diagnosis not present

## 2018-11-01 DIAGNOSIS — I6930 Unspecified sequelae of cerebral infarction: Secondary | ICD-10-CM | POA: Diagnosis not present

## 2019-09-02 ENCOUNTER — Other Ambulatory Visit: Payer: Self-pay

## 2019-09-02 ENCOUNTER — Ambulatory Visit: Payer: Medicare Other | Attending: Orthopedic Surgery | Admitting: Physical Therapy

## 2019-09-02 DIAGNOSIS — M6281 Muscle weakness (generalized): Secondary | ICD-10-CM | POA: Diagnosis present

## 2019-09-02 DIAGNOSIS — I69351 Hemiplegia and hemiparesis following cerebral infarction affecting right dominant side: Secondary | ICD-10-CM | POA: Insufficient documentation

## 2019-09-02 DIAGNOSIS — R2689 Other abnormalities of gait and mobility: Secondary | ICD-10-CM | POA: Diagnosis present

## 2019-09-02 NOTE — Therapy (Signed)
[facility] [address] [place], [name], [address] Phone: [phone]   Fax:  [phone]  Physical Therapy Evaluation  Patient Details  Name: Annette Munoz MRN: [id number] Date of Birth: March 18, 1966 Referring Provider (PT): Elsie Saas, MD   Encounter Date: 09/02/2019  PT End of Session - 09/02/19 1855    Visit Number  1    Number of Visits  16    Date for PT Re-Evaluation  10/28/19    Authorization Type  MCR    PT Start Time  4854    PT Stop Time  1530    PT Time Calculation (min)  45 min    Activity Tolerance  Patient tolerated treatment well    Behavior During Therapy  [facility] for tasks assessed/performed       Past Medical History:  Diagnosis Date  . Diabetes mellitus without complication ([place])   . Hypertension   . Stroke [facility])     Past Surgical History:  Procedure Laterality Date  . CESAREAN SECTION    . TEE WITHOUT CARDIOVERSION N/A 08/10/2015   Procedure: TRANSESOPHAGEAL ECHOCARDIOGRAM (TEE);  Surgeon: [name], MD;  Location: [facility] ENDOSCOPY;  Service: Cardiovascular;  Laterality: N/A;    There were no vitals filed for this visit.   Subjective Assessment - 09/02/19 1456    Subjective  She had CVA 2016 and 2017 with R side weakness/Gaithistory of DM, HTN, obesity, HLD was admitted on 08/07/15 for AMS and amnesia. 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 MRA and CTA showed left ACA, right ACA, left M1, M2, and b/l PCA mild to moderate stenosis. She is having difficuly with walking and balance, lifting up her Rt leg.    How long can you stand comfortably?  one hour if has something to lean on    Diagnostic tests  MRI showed left ACA territory scattered infarct as well as right hypothalamus infarct, embolic pattern. MRA and CTA showed left ACA, right ACA, left M1, M2, and b/l PCA mild to moderate stenosis.    Patient Stated Goals   strengthen Rt side and walk better    Currently in Pain?  No/denies         [facility] PT Assessment - 09/02/19 0001      Assessment   Medical Diagnosis  CVA 2016 and 2017 with R side weakness/Gait    Referring Provider (PT)  Elsie Saas, MD    Onset Date/Surgical Date  --   CVA 2016 and 2017   Hand Dominance  Right    Next MD Visit  nothing scheduled    Prior Therapy  PT after the stroke      Precautions   Precautions  None      Balance Screen   Has the patient fallen in the past 6 months  No   but close calls     [place] residence    Additional Comments  12 steps to enter she can do these but some difficulty      Prior Function   Level of Independence  Independent with basic ADLs    Leisure  did cosmetics but has not returned back to work      Cognition   Overall Cognitive Status  --   relays Short term memory deficits   Memory  --   able to recall 3 nouns after 3 minutes     Sensation  Light Touch  Appears Intact      Coordination   Gross Motor Movements are Fluid and Coordinated  No    Coordination and Movement Description  decreased on Rt side compared to Lt      ROM / Strength   AROM / PROM / Strength  AROM;Strength      AROM   Overall AROM   Within functional limits for tasks performed      Strength   Overall Strength Comments  All tested in sitting: Rt UE 4+/5 MMT grossly except shoulder flexion only 4/5Lt, Rt hip flexion and abd 4/5, Rt knee flexion 4/5, Rt knee ext 5/5, Rt ankle 4/5 overall. UE/LE 5/5 except Lt hip flexion 4+,      Transfers   Five time sit to stand comments   15 sec no UE support      Ambulation/Gait   Gait velocity  20 ft in 13 sec =1.53 ft/sec without AD      Standardized Balance Assessment   Standardized Balance Assessment  Berg Balance Test;Timed Up and Go Test;Five Times Sit to Stand      Berg Balance Test   Sit to Stand  Able to stand without using hands and stabilize independently     Standing Unsupported  Able to stand safely 2 minutes    Sitting with Back Unsupported but Feet Supported on Floor or Stool  Able to sit safely and securely 2 minutes    Stand to Sit  Sits safely with minimal use of hands    Transfers  Able to transfer safely, minor use of hands    Standing Unsupported with Eyes Closed  Able to stand 10 seconds with supervision    Standing Unsupported with Feet Together  Able to place feet together independently and stand for 1 minute with supervision    From Standing, Reach Forward with Outstretched Arm  Can reach confidently >25 cm (10")    From Standing Position, Pick up Object from Floor  Able to pick up shoe, needs supervision    From Standing Position, Turn to Look Behind Over each Shoulder  Looks behind from both sides and weight shifts well    Turn 360 Degrees  Able to turn 360 degrees safely but slowly    Standing Unsupported, Alternately Place Feet on Step/Stool  Able to complete 4 steps without aid or supervision    Standing Unsupported, One Foot in Front  Able to plae foot ahead of the other independently and hold 30 seconds    Standing on One Leg  Able to lift leg independently and hold equal to or more than 3 seconds    Total Score  46      Timed Up and Go Test   Normal TUG (seconds)  21    TUG Comments  no AD      Functional Gait  Assessment   Gait assessed   Yes    Gait Level Surface  Walks 20 ft, slow speed, abnormal gait pattern, evidence for imbalance or deviates 10-15 in outside of the 12 in walkway width. Requires more than 7 sec to ambulate 20 ft.    Change in Gait Speed  Able to change speed, demonstrates mild gait deviations, deviates 6-10 in outside of the 12 in walkway width, or no gait deviations, unable to achieve a major change in velocity, or uses a change in velocity, or uses an assistive device.    Gait with Horizontal Head Turns  Performs head turns with  moderate changes in gait velocity, slows down, deviates 10-15 in outside  12 in walkway width but recovers, can continue to walk.    Gait with Vertical Head Turns  Performs task with moderate change in gait velocity, slows down, deviates 10-15 in outside 12 in walkway width but recovers, can continue to walk.    Gait and Pivot Turn  Turns slowly, requires verbal cueing, or requires several small steps to catch balance following turn and stop    Gait with Eyes Closed  Walks 20 ft, slow speed, abnormal gait pattern, evidence for imbalance, deviates 10-15 in outside 12 in walkway width. Requires more than 9 sec to ambulate 20 ft.    Ambulating Backwards  Walks 20 ft, slow speed, abnormal gait pattern, evidence for imbalance, deviates 10-15 in outside 12 in walkway width.    Steps  Two feet to a stair, must use rail.    FGA comment:  finish FGA next session when more time allows                Objective measurements completed on examination: See above findings.              PT Education - 09/02/19 1855    Education Details  exam findings, PT POC    Person(s) Educated  Patient    Methods  Explanation    Comprehension  Verbalized understanding;Need further instruction       PT Short Term Goals - 09/02/19 1909      PT SHORT TERM GOAL #1   Title  Pt/family verblaize/demo HEP for strength, balance, gait.  TARGET 10/01/19    Time  4    Period  Weeks    Status  New      PT SHORT TERM GOAL #2   Title  Pt will imporve TUG from 21 seconds to less than 18 seconds no AD    Baseline  21    Time  4    Period  Weeks    Status  New        PT Long Term Goals - 09/02/19 1912      PT LONG TERM GOAL #1   Title  Pt/family will verbalize understanding of final HEP and continued exercise plan post PT discharge  TARGET for all LTG 8 weeks 10/28/19)    Time  8    Period  Weeks    Status  New      PT LONG TERM GOAL #2   Title  UPDATED GOAL:  Pt will improved gait velocity to at least 1.8 ft/sec LRAD for improved mobility.    Baseline  1.5 ft/sec     Time  8    Period  Weeks    Status  New      PT LONG TERM GOAL #3   Title  Improve TUG score to less than or equal to 15 seconds for decreased fall risk.    Baseline  21    Time  8    Period  Weeks    Status  New      PT LONG TERM GOAL #4   Title  FGA to be assessed and goal to be written as appropriate.    Time  8    Period  Weeks    Status  New      PT LONG TERM GOAL #5   Title  UPDATED GOAL:  ambulate at least 1000 ft indoor/outdoor surfaces with supervision and LRAD  Time  8    Period  Weeks    Status  New             Plan - 09/02/19 1859    Clinical Impression Statement  Pt presents with CVA 2016 and 2017 with R side weakness, Rt drop foot, decreased coordination, decreased balance, and gait instability. She uses SPC for community ambulaiton and does not use AD for home ambulation. Her balance scores place her at an increased risk for falling. She will benefit from skilled PT to address her deficits.    Personal Factors and Comorbidities  Comorbidity 3+    Comorbidities  CVA X2 with Rt hemiparesis, history of DM, HTN, obesity, HLD    Examination-Activity Limitations  Locomotion Level;Transfers;Bend;Carry;Squat;Stairs;Lift;Stand    Examination-Participation Restrictions  Meal Prep;Cleaning;Community Activity;Shop;Laundry    Stability/Clinical Decision Making  Evolving/Moderate complexity    Clinical Decision Making  Moderate    Rehab Potential  Good    PT Frequency  2x / week    PT Duration  8 weeks    PT Treatment/Interventions  ADLs/Self Care Home Management;Aquatic Therapy;Electrical Stimulation;Neuromuscular re-education;Balance training;Therapeutic exercise;Therapeutic activities;Stair training;Gait training;Patient/family education;Orthotic Fit/Training;Manual techniques;Energy conservation;Passive range of motion;Dry needling    PT Next Visit Plan  finish FGA without AD and establish goal, create and educate on HEP for Rt sided sided strength, balance     Consulted and Agree with Plan of Care  Patient;Family member/caregiver    Family Member Consulted  daughter       Patient will benefit from skilled therapeutic intervention in order to improve the following deficits and impairments:  Abnormal gait, Decreased activity tolerance, Decreased balance, Decreased coordination, Decreased endurance, Decreased mobility, Decreased strength, Difficulty walking, Pain, Impaired vision/preception  Visit Diagnosis: Muscle weakness (generalized)  Other abnormalities of gait and mobility  Hemiplegia and hemiparesis following cerebral infarction affecting right dominant side [facility](HCC)     Problem List Patient Active Problem List   Diagnosis Date Noted  . Intracranial vascular stenosis 12/05/2016  . Depression 12/05/2016  . Seizure-like activity (HCC) 06/28/2016  . 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 Gait difficulty   . Memory loss 05/22/2016  . Confusion   . Acute ischemic stroke (HCC) 05/18/2016  . Morbid obesity (HCC) 09/30/2015  . Diabetes mellitus (HCC)   . Essential hypertension   . Hyperlipidemia   . Cerebrovascular accident (CVA) due to thrombosis of cerebral artery (HCC)   . CVA (cerebral infarction) 08/07/2015  . Pure hypercholesterolemia 10/04/2009    Birdie RiddleBrian R Kivon Aprea,PT,DPT 09/02/2019, 7:20 PM  [facility] [address] [place], [name], [address] Phone: [phone]   Fax:  [phone]  Name: Salena SanerBetty H Abee MRN: [id number] Date of Birth: 10/05/1966

## 2019-09-18 ENCOUNTER — Ambulatory Visit: Payer: Medicare Other | Attending: Orthopedic Surgery

## 2019-09-18 ENCOUNTER — Other Ambulatory Visit: Payer: Self-pay

## 2019-09-18 VITALS — BP 107/66 | HR 57

## 2019-09-18 DIAGNOSIS — M6281 Muscle weakness (generalized): Secondary | ICD-10-CM

## 2019-09-18 DIAGNOSIS — R2689 Other abnormalities of gait and mobility: Secondary | ICD-10-CM | POA: Diagnosis present

## 2019-09-18 DIAGNOSIS — I69351 Hemiplegia and hemiparesis following cerebral infarction affecting right dominant side: Secondary | ICD-10-CM | POA: Insufficient documentation

## 2019-09-18 NOTE — Patient Instructions (Signed)
Access Code: [email or web address]  URL: [email or web address]  Date: 09/18/2019  Prepared by: Geoffry Paradise   Exercises Backwards Walking - 4 reps - 1 sets - 1x daily - 5x weekly Side Stepping with Counter Support - 4 reps - 1 sets - 1x daily - 5x weekly Standing Tandem Balance with Counter Support - 3 reps - 1 sets - 1x daily - 5x weekly

## 2019-09-18 NOTE — Therapy (Signed)
[facility] [address] [place], [place], [address] Phone: [phone]   Fax:  [phone]  Physical Therapy Treatment  Patient Details  Name: Annette Munoz MRN: [id number] Date of Birth: 1966-06-28 Referring Provider (PT): Salvatore Marvel, MD   Encounter Date: 09/18/2019  PT End of Session - 09/18/19 1742    Visit Number  2    Number of Visits  16    Date for PT Re-Evaluation  10/28/19    Authorization Type  MCR    PT Start Time  1700    PT Stop Time  1740    PT Time Calculation (min)  40 min    Equipment Utilized During Treatment  Gait belt    Activity Tolerance  Patient tolerated treatment well    Behavior During Therapy  [facility] for tasks assessed/performed       Past Medical History:  Diagnosis Date  . Diabetes mellitus without complication (HCC)   . Hypertension   . Stroke [facility])     Past Surgical History:  Procedure Laterality Date  . CESAREAN SECTION    . TEE WITHOUT CARDIOVERSION N/A 08/10/2015   Procedure: TRANSESOPHAGEAL ECHOCARDIOGRAM (TEE);  Surgeon: Chrystie Nose, MD;  Location: [facility] ENDOSCOPY;  Service: Cardiovascular;  Laterality: N/A;    Vitals:   09/18/19 1702 09/18/19 1719 09/18/19 1721  BP: 135/74 (!) 144/88 107/66  Pulse: (!) 59 63 (!) 57    Subjective Assessment - 09/18/19 1702    Subjective  Pt denied falls or changes since last visit. Pt has not assessed BP at home.    Patient is accompained by:  Family member   dtr: Georges Lynch   Patient Stated Goals  strengthen Rt side and walk better    Currently in Pain?  No/denies         [facility] PT Assessment - 09/18/19 1705      Functional Gait  Assessment   Gait assessed   Yes    Gait Level Surface  Walks 20 ft, slow speed, abnormal gait pattern, evidence for imbalance or deviates 10-15 in outside of the 12 in walkway width. Requires more than 7 sec to ambulate 20 ft.   11.95 sec. no AD   Change in Gait Speed  Makes only minor adjustments  to walking speed, or accomplishes a change in speed with significant gait deviations, deviates 10-15 in outside the 12 in walkway width, or changes speed but loses balance but is able to recover and continue walking.    Gait with Horizontal Head Turns  Performs head turns with moderate changes in gait velocity, slows down, deviates 10-15 in outside 12 in walkway width but recovers, can continue to walk.    Gait with Vertical Head Turns  Performs task with moderate change in gait velocity, slows down, deviates 10-15 in outside 12 in walkway width but recovers, can continue to walk.    Gait and Pivot Turn  Turns slowly, requires verbal cueing, or requires several small steps to catch balance following turn and stop    Step Over Obstacle  Is able to step over one shoe box (4.5 in total height) but must slow down and adjust steps to clear box safely. May require verbal cueing.    Gait with Narrow Base of Support  Ambulates less than 4 steps heel to toe or cannot perform without assistance.    Gait with Eyes Closed  Walks 20 ft, slow speed, abnormal gait pattern, evidence for imbalance, deviates 10-15 in  outside 12 in walkway width. Requires more than 9 sec to ambulate 20 ft.   >12 sec. and deviated outside of path   Ambulating Backwards  Walks 20 ft, slow speed, abnormal gait pattern, evidence for imbalance, deviates 10-15 in outside 12 in walkway width.    Steps  Two feet to a stair, must use rail.   one foot to ascend and two feet to a step to descend   Total Score  9    FGA comment:  9/30: high falls risk         NMR: provided to pt as HEP, with demo and cues for technique. Min guard to S for safety. Access Code: [email or web address]  URL: [email or web address]  Date: 09/18/2019  Prepared by: Zerita BoersJennifer Emelyn Roen   Exercises Backwards Walking - 4 reps - 1 sets - 1x daily - 5x weekly Side Stepping with Counter Support - 4 reps - 1 sets - 1x daily - 5x weekly Standing Tandem Balance with Counter  Support - 3 reps - 1 sets - 1x daily - 5x weekly                  PT Education - 09/18/19 1742    Education Details  Educated pt on FGA results, BP measurings and provided pt with balance HEP-will add strengthening next session.    Person(s) Educated  Patient    Methods  Explanation;Demonstration;Verbal cues;Tactile cues;Handout    Comprehension  Verbalized understanding;Returned demonstration       PT Short Term Goals - 09/18/19 1745      PT SHORT TERM GOAL #1   Title  Pt/family verblaize/demo HEP for strength, balance, gait.  TARGET 10/01/19    Time  4    Period  Weeks    Status  New      PT SHORT TERM GOAL #2   Title  Pt will imporve TUG from 21 seconds to less than 18 seconds no AD    Baseline  21    Time  4    Period  Weeks    Status  New      PT SHORT TERM GOAL #3   Title  Pt will improve FGA score to >/=13/30 to decr. falls risk.    Time  4    Period  Weeks    Status  New        PT Long Term Goals - 09/18/19 1745      PT LONG TERM GOAL #1   Title  Pt/family will verbalize understanding of final HEP and continued exercise plan post PT discharge  TARGET for all LTG 8 weeks 10/28/19)    Time  8    Period  Weeks    Status  New      PT LONG TERM GOAL #2   Title  UPDATED GOAL:  Pt will improved gait velocity to at least 1.8 ft/sec LRAD for improved mobility.    Baseline  1.5 ft/sec    Time  8    Period  Weeks    Status  New      PT LONG TERM GOAL #3   Title  Improve TUG score to less than or equal to 15 seconds for decreased fall risk.    Baseline  21    Time  8    Period  Weeks    Status  New      PT LONG TERM GOAL #4   Title  Pt's FGA score will improve to >/=19/30 to  decr. falls risk.    Baseline  9/30 FGA no AD on 09/17/19    Time  8    Period  Weeks    Status  Revised      PT LONG TERM GOAL #5   Title  UPDATED GOAL:  ambulate at least 1000 ft indoor/outdoor surfaces with supervision and LRAD    Time  8    Period  Weeks    Status   New            Plan - 09/18/19 1743    Clinical Impression Statement  Pt's FGA score without AD indicated pt is at high risk for falls. Pt's BP did fluctuate significantly during exercise vs. at rest. Pt denied, pain, N/T, dizziness or weakness. PT will send note to Dr. Criss Rosales re: BP. Pt progressed from one UE support at counter during sidestepping but did experience incr. R foot drop with fatigue. Pt would continue to benefit from skilled PT to improve safety during functional mobility.    Personal Factors and Comorbidities  Comorbidity 3+    Comorbidities  CVA X2 with Rt hemiparesis, history of DM, HTN, obesity, HLD    Examination-Activity Limitations  Locomotion Level;Transfers;Bend;Carry;Squat;Stairs;Lift;Stand    Examination-Participation Restrictions  Meal Prep;Cleaning;Community Activity;Shop;Laundry    Stability/Clinical Decision Making  Evolving/Moderate complexity    Rehab Potential  Good    PT Frequency  2x / week    PT Duration  8 weeks    PT Treatment/Interventions  ADLs/Self Care Home Management;Aquatic Therapy;Electrical Stimulation;Neuromuscular re-education;Balance training;Therapeutic exercise;Therapeutic activities;Stair training;Gait training;Patient/family education;Orthotic Fit/Training;Manual techniques;Energy conservation;Passive range of motion;Dry needling    PT Next Visit Plan  Check to see if Dr. Criss Rosales responded re: BP. Trial R AFO or toe up brace. create and educate on HEP for Rt sided sided strength, balance    PT Home Exercise Plan  [email or web address]    Consulted and Agree with Plan of Care  Patient;Family member/caregiver    Family Member Consulted  daughter       Patient will benefit from skilled therapeutic intervention in order to improve the following deficits and impairments:  Abnormal gait, Decreased activity tolerance, Decreased balance, Decreased coordination, Decreased endurance, Decreased mobility, Decreased strength, Difficulty walking, Pain, Impaired  vision/preception  Visit Diagnosis: Other abnormalities of gait and mobility  Hemiplegia and hemiparesis following cerebral infarction affecting right dominant side (HCC)  Muscle weakness (generalized)     Problem List Patient Active Problem List   Diagnosis Date Noted  . Intracranial vascular stenosis 12/05/2016  . Depression 12/05/2016  . Seizure-like activity ([place]) 06/28/2016  . 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 Gait difficulty   . Memory loss 05/22/2016  . Confusion   . Acute ischemic stroke ([place]) 05/18/2016  . Morbid obesity ([place]) 09/30/2015  . Diabetes mellitus ([place])   . Essential hypertension   . Hyperlipidemia   . Cerebrovascular accident (CVA) due to thrombosis of cerebral artery ([place])   . CVA (cerebral infarction) 08/07/2015  . Pure hypercholesterolemia 10/04/2009    Kalana Yust L 09/18/2019, 5:47 PM  [facility] [address] [place], [name], [address] Phone: [phone]   Fax:  [phone]  Name: AMALYA SALMONS MRN: [id number] Date of Birth: 24-Jul-1966  Geoffry Paradise, PT,DPT 09/18/19 5:47 PM Phone: [phone] Fax: [phone]

## 2019-09-24 ENCOUNTER — Ambulatory Visit: Payer: Medicare Other | Admitting: Physical Therapy

## 2019-09-24 ENCOUNTER — Encounter: Payer: Self-pay | Admitting: Physical Therapy

## 2019-09-24 ENCOUNTER — Other Ambulatory Visit: Payer: Self-pay

## 2019-09-24 VITALS — BP 140/68 | HR 61

## 2019-09-24 DIAGNOSIS — R2689 Other abnormalities of gait and mobility: Secondary | ICD-10-CM

## 2019-09-24 DIAGNOSIS — I69351 Hemiplegia and hemiparesis following cerebral infarction affecting right dominant side: Secondary | ICD-10-CM

## 2019-09-24 DIAGNOSIS — M6281 Muscle weakness (generalized): Secondary | ICD-10-CM

## 2019-09-24 NOTE — Patient Instructions (Addendum)
Blood Pressure Record Sheet To take your blood pressure, you will need a blood pressure machine. You can buy a blood pressure machine (blood pressure monitor) at your clinic, drug store, or online. When choosing one, consider:  An automatic monitor that has an arm cuff.  A cuff that wraps snugly around your upper arm. You should be able to fit only one finger between your arm and the cuff.  A device that stores blood pressure reading results.  Do not choose a monitor that measures your blood pressure from your wrist or finger. Follow your health care provider's instructions for how to take your blood pressure. To use this form:  Get one reading in the morning (a.m.) before you take any medicines.  Get one reading in the evening (p.m.) before supper.  Take at least 2 readings with each blood pressure check. This makes sure the results are correct. Wait 1-2 minutes between measurements.  Write down the results in the spaces on this form.  Repeat this once a week, or as told by your health care provider.  Make a follow-up appointment with your health care provider to discuss the results. Blood pressure log Date: _______________________  a.m. _____________________(1st reading) _____________________(2nd reading)  p.m. _____________________(1st reading) _____________________(2nd reading) Date: _______________________  a.m. _____________________(1st reading) _____________________(2nd reading)  p.m. _____________________(1st reading) _____________________(2nd reading) Date: _______________________  a.m. _____________________(1st reading) _____________________(2nd reading)  p.m. _____________________(1st reading) _____________________(2nd reading) Date: _______________________  a.m. _____________________(1st reading) _____________________(2nd reading)  p.m. _____________________(1st reading) _____________________(2nd reading) Date: _______________________  a.m.  _____________________(1st reading) _____________________(2nd reading)  p.m. _____________________(1st reading) _____________________(2nd reading) This information is not intended to replace advice given to you by your health care provider. Make sure you discuss any questions you have with your health care provider. Document Released: 07/01/2003 Document Revised: 11/30/2017 Document Reviewed: 10/02/2017 Elsevier Patient Education  2020 [employer].   Access Code: [email or web address]  URL: [email or web address]  Date: 09/24/2019  Prepared by: Janann August   Exercises Backwards Walking - 4 reps - 1 sets - 1x daily - 5x weekly Side Stepping with Counter Support - 4 reps - 1 sets - 1x daily - 5x weekly Standing Tandem Balance with Counter Support - 3 reps - 1 sets - 1x daily - 5x weekly Supine Bridge - 10 reps - 2 sets - 2x daily - 7x weekly Clamshell with Resistance - 10 reps - 2 sets - 2x daily - 7x weekly Sit to Stand without Arm Support - 10 reps - 2 sets - 1x daily - 7x weekly

## 2019-09-25 ENCOUNTER — Ambulatory Visit: Payer: Medicare Other

## 2019-09-25 NOTE — Therapy (Addendum)
[facility] [address] [place], [place], [address] Phone: [phone]   Fax:  [phone]  Physical Therapy Treatment  Patient Details  Name: Annette Munoz MRN: [id number] Date of Birth: 05-Aug-1966 Referring Provider (PT): Salvatore Marvel, MD   Encounter Date: 09/24/2019  PT End of Session - 09/25/19 0905    Visit Number  3    Number of Visits  16    Date for PT Re-Evaluation  10/28/19    Authorization Type  MCR    PT Start Time  1615    PT Stop Time  1700    PT Time Calculation (min)  45 min    Equipment Utilized During Treatment  Gait belt    Activity Tolerance  Patient tolerated treatment well    Behavior During Therapy  [facility] for tasks assessed/performed       Past Medical History:  Diagnosis Date  . Diabetes mellitus without complication (HCC)   . Hypertension   . Stroke [facility])     Past Surgical History:  Procedure Laterality Date  . CESAREAN SECTION    . TEE WITHOUT CARDIOVERSION N/A 08/10/2015   Procedure: TRANSESOPHAGEAL ECHOCARDIOGRAM (TEE);  Surgeon: Chrystie Nose, MD;  Location: [facility] ENDOSCOPY;  Service: Cardiovascular;  Laterality: N/A;    Vitals:   09/24/19 1622 09/24/19 1651  BP: (!) 145/76 140/68  Pulse: 61     Subjective Assessment - 09/24/19 1620    Subjective  Pt's daughter says that she has been checking his blood pressures at home and they have been good. Have been doing the balance exercises at home. No falls.    Patient is accompained by:  Family member   dtr: Annette Munoz   Patient Stated Goals  strengthen Rt side and walk better    Currently in Pain?  No/denies                    [facility] Adult PT Treatment/Exercise - 09/25/19 0001      Ambulation/Gait   Ambulation/Gait  Yes    Ambulation/Gait Assistance  5: Supervision    Ambulation/Gait Assistance Details  Pt noted to ambulate into clinic with SPC in RUE (pt's weak side). Asked pt if she ever tried to use cane in LUE  (opposite side of weakness) - pt trialed and pt demonstrating a 2 point gait pattern with a step through pattern and improved balance. Pt stating that she would continue to try ambulating with cane in LUE.    Ambulation Distance (Feet)  230 Feet    Assistive device  Straight cane    Gait Pattern  Step-through pattern;Decreased stance time - right;Decreased stride length;Decreased dorsiflexion - right;Decreased weight shift to right;Poor foot clearance - right    Ambulation Surface  Level;Indoor          Access Code: [email or web address]  URL: [email or web address]  Date: 09/24/2019  Prepared by: Sherlie Ban     Exercises Backwards Walking - 4 reps - 1 sets - 1x daily - 5x weekly Side Stepping with Counter Support - 4 reps - 1 sets - 1x daily - 5x weekly Standing Tandem Balance with Counter Support - 3 reps - 1 sets - 1x daily - 5x weekly    New additions to HEP for LE strengthening:  Supine Bridge - 10 reps - 2 sets - 2x daily - 7x weekly Clamshell with Resistance - 10 reps - 2 sets - 2x daily - 7x weekly - on R with red theraband  Sit to Stand without Arm Support - 10 reps - 2 sets - 1x daily - 7x weekly - cues for eccentric control     PT Education - 09/25/19 0904    Education Details  BP log to monitor BP readings at home, LE strengthening exercises for HEP, gait training with cane in LUE    Person(s) Educated  Patient;Child(ren)    Methods  Explanation;Demonstration;Handout;Tactile cues;Verbal cues    Comprehension  Verbalized understanding;Returned demonstration       PT Short Term Goals - 09/18/19 1745      PT SHORT TERM GOAL #1   Title  Pt/family verblaize/demo HEP for strength, balance, gait.  TARGET 10/01/19    Time  4    Period  Weeks    Status  New      PT SHORT TERM GOAL #2   Title  Pt will imporve TUG from 21 seconds to less than 18 seconds no AD    Baseline  21    Time  4    Period  Weeks    Status  New      PT SHORT TERM GOAL #3   Title  Pt  will improve FGA score to >/=13/30 to decr. falls risk.    Time  4    Period  Weeks    Status  New        PT Long Term Goals - 09/18/19 1745      PT LONG TERM GOAL #1   Title  Pt/family will verbalize understanding of final HEP and continued exercise plan post PT discharge  TARGET for all LTG 8 weeks 10/28/19)    Time  8    Period  Weeks    Status  New      PT LONG TERM GOAL #2   Title  UPDATED GOAL:  Pt will improved gait velocity to at least 1.8 ft/sec LRAD for improved mobility.    Baseline  1.5 ft/sec    Time  8    Period  Weeks    Status  New      PT LONG TERM GOAL #3   Title  Improve TUG score to less than or equal to 15 seconds for decreased fall risk.    Baseline  21    Time  8    Period  Weeks    Status  New      PT LONG TERM GOAL #4   Title  Pt's FGA score will improve to >/=19/30 to decr. falls risk.    Baseline  9/30 FGA no AD on 09/17/19    Time  8    Period  Weeks    Status  Revised      PT LONG TERM GOAL #5   Title  UPDATED GOAL:  ambulate at least 1000 ft indoor/outdoor surfaces with supervision and LRAD    Time  8    Period  Weeks    Status  New            Plan - 09/25/19 0914    Clinical Impression Statement  Focus of today's skilled session was gait training with SPC and LE strengthening additions to HEP. Pt's BP WFL before and after therapy session today. Provided BP log to pt's daughter to continue monitoring BP at home. Pt demonstrating improved gait mechanics, balance and 2 point step through pattern today with SPC in LUE, contralateral to pt's weak R side (vs. pt's dominant RUE). Pt tolerated strengthening exercises well for  HEP - needed tactile and verbal cues for technique. Will continue to progress towards LTGs.    Personal Factors and Comorbidities  Comorbidity 3+    Comorbidities  CVA X2 with Rt hemiparesis, history of DM, HTN, obesity, HLD    Examination-Activity Limitations  Locomotion  Level;Transfers;Bend;Carry;Squat;Stairs;Lift;Stand    Examination-Participation Restrictions  Meal Prep;Cleaning;Community Activity;Shop;Laundry    Stability/Clinical Decision Making  Evolving/Moderate complexity    Rehab Potential  Good    PT Frequency  2x / week    PT Duration  8 weeks    PT Treatment/Interventions  ADLs/Self Care Home Management;Aquatic Therapy;Electrical Stimulation;Neuromuscular re-education;Balance training;Therapeutic exercise;Therapeutic activities;Stair training;Gait training;Patient/family education;Orthotic Fit/Training;Manual techniques;Energy conservation;Passive range of motion;Dry needling    PT Next Visit Plan  Check to see if Dr. Criss Rosales responded re: BP. Trial R AFO or toe up brace. continue gait training, RLE strengthening, and balance. add to HEP as appropriate.    PT Home Exercise Plan  [email or web address]    Consulted and Agree with Plan of Care  Patient;Family member/caregiver    Family Member Consulted  daughter       Patient will benefit from skilled therapeutic intervention in order to improve the following deficits and impairments:  Abnormal gait, Decreased activity tolerance, Decreased balance, Decreased coordination, Decreased endurance, Decreased mobility, Decreased strength, Difficulty walking, Pain, Impaired vision/preception  Visit Diagnosis: Other abnormalities of gait and mobility  Hemiplegia and hemiparesis following cerebral infarction affecting right dominant side (HCC)  Muscle weakness (generalized)     Problem List Patient Active Problem List   Diagnosis Date Noted  . Intracranial vascular stenosis 12/05/2016  . Depression 12/05/2016  . Seizure-like activity ([place]) 06/28/2016  . 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 Gait difficulty   . Memory loss 05/22/2016  . Confusion   . Acute ischemic stroke ([place]) 05/18/2016  . Morbid obesity ([place]) 09/30/2015  . Diabetes mellitus ([place])   . Essential hypertension   .  Hyperlipidemia   . Cerebrovascular accident (CVA) due to thrombosis of cerebral artery ([place])   . CVA (cerebral infarction) 08/07/2015  . Pure hypercholesterolemia 10/04/2009    Arliss Journey, PT, DPT  09/25/2019, 9:14 AM  [facility] [address] [place], [name], [address] Phone: [phone]   Fax:  [phone]  Name: Annette Munoz MRN: [id number] Date of Birth: [birth year]

## 2019-09-30 ENCOUNTER — Ambulatory Visit: Payer: Medicare Other | Admitting: Physical Therapy

## 2019-10-02 ENCOUNTER — Ambulatory Visit: Payer: Medicare Other

## 2019-10-07 ENCOUNTER — Other Ambulatory Visit: Payer: Self-pay

## 2019-10-07 ENCOUNTER — Encounter: Payer: Self-pay | Admitting: Physical Therapy

## 2019-10-07 ENCOUNTER — Ambulatory Visit: Payer: Medicare Other | Admitting: Physical Therapy

## 2019-10-07 DIAGNOSIS — I69351 Hemiplegia and hemiparesis following cerebral infarction affecting right dominant side: Secondary | ICD-10-CM

## 2019-10-07 DIAGNOSIS — M6281 Muscle weakness (generalized): Secondary | ICD-10-CM

## 2019-10-07 DIAGNOSIS — R2689 Other abnormalities of gait and mobility: Secondary | ICD-10-CM

## 2019-10-07 NOTE — Therapy (Signed)
[facility] [address] [place], [name], [address] Phone: [phone]   Fax:  [phone]  Physical Therapy Treatment  Patient Details  Name: Annette Munoz MRN: [id number] Date of Birth: September 08, 1966 Referring Provider (PT): Elsie Saas, MD   Encounter Date: 10/07/2019  PT End of Session - 10/07/19 2027    Visit Number  4    Number of Visits  16    Date for PT Re-Evaluation  10/28/19    Authorization Type  MCR    PT Start Time  1700    PT Stop Time  1743    PT Time Calculation (min)  43 min    Equipment Utilized During Treatment  Gait belt    Activity Tolerance  Patient tolerated treatment well    Behavior During Therapy  [facility] for tasks assessed/performed       Past Medical History:  Diagnosis Date  . Diabetes mellitus without complication ([place])   . Hypertension   . Stroke [facility])     Past Surgical History:  Procedure Laterality Date  . CESAREAN SECTION    . TEE WITHOUT CARDIOVERSION N/A 08/10/2015   Procedure: TRANSESOPHAGEAL ECHOCARDIOGRAM (TEE);  Surgeon: [name], MD;  Location: [facility] ENDOSCOPY;  Service: Cardiovascular;  Laterality: N/A;    There were no vitals filed for this visit.  Subjective Assessment - 10/07/19 1704    Subjective  did a lot of walking today - went to the grocery store. BP has been good at home. Exercises are going well at home. Having trouble with the bridging exercises on the bed.    Patient is accompained by:  Family member   dtr: [name]   Patient Stated Goals  strengthen Rt side and walk better    Currently in Pain?  No/denies                       [facility] Adult PT Treatment/Exercise - 10/07/19 0001      Ambulation/Gait   Ambulation/Gait  Yes    Ambulation/Gait Assistance  5: Supervision    Ambulation/Gait Assistance Details  cues for cane sequencing ambulating in and out of clinic. Trialed R [place] Walk On AFO and foot up brace due to decreased  foot clearance and ankle DF on RLE during gait with no AD. Cues for heel > toe pattern throughout gait today and reciprocal arm swing. Pt demonstrated improved heel > toe pattern and foot clearance with use of R [place] WalkOn AFO with pt reporting feeling more stable when ambulating with the brace vs. foot up brace. Without use of brace, pt demonstrates increased R toe out, decreased foot clearance and decreased ankle DF.  Pt states that she had a brace a couple years ago, but does not have it anymore.     Ambulation Distance (Feet)  450 Feet    Assistive device  Straight cane;None    Gait Pattern  Step-through pattern;Decreased stance time - right;Decreased stride length;Decreased dorsiflexion - right;Decreased weight shift to right;Poor foot clearance - right    Ambulation Surface  Level;Indoor      Therapeutic Activites    Therapeutic Activities  Other Therapeutic Activities    Other Therapeutic Activities  Pt's daughter states that it has been difficult to perform bridging exercise at home du eto feet sliding on her bed, wondered if performing it on the floor would be better. Practiced floor transfers with pt with min guard, and verbal and demonstrative cueing for proper technique  with pt taking increased time. Instructed pt's daughter to continue to perform bridging on the bed with a family member holding pt's BLE. Demonstrated safe floor > stand transfers from half kneeling position and using stronger LE and UEs to push to stand.       Neuro Re-ed    Neuro Re-ed Details   Pt's daughter states that it has been difficult to perform bridging exercise at home du eto feet sliding on her bed, wondered if performing it on the floor would be better. Practiced floor transfers with pt with min guard, and verbal and demonstrative cueing for proper technique with pt taking increased time. Instructed pt's daughter to continue to perform bridging on the bed with a family member holding pt's BLE. Demonstrated  safe floor > stand transfers from half kneeling position and using stronger LE and UEs to push to stand.                PT Short Term Goals - 10/07/19 1741      PT SHORT TERM GOAL #1   Title  Pt/family verblaize/demo HEP for strength, balance, gait.  TARGET 10/01/19    Time  4    Period  Weeks    Status  Achieved      PT SHORT TERM GOAL #2   Title  Pt will imporve TUG from 21 seconds to less than 18 seconds no AD    Baseline  17.78 seconds on 10/07/19    Time  4    Period  Weeks    Status  Achieved      PT SHORT TERM GOAL #3   Title  Pt will improve FGA score to >/=13/30 to decr. falls risk.    Time  4    Period  Weeks    Status  New        PT Long Term Goals - 09/18/19 1745      PT LONG TERM GOAL #1   Title  Pt/family will verbalize understanding of final HEP and continued exercise plan post PT discharge  TARGET for all LTG 8 weeks 10/28/19)    Time  8    Period  Weeks    Status  New      PT LONG TERM GOAL #2   Title  UPDATED GOAL:  Pt will improved gait velocity to at least 1.8 ft/sec LRAD for improved mobility.    Baseline  1.5 ft/sec    Time  8    Period  Weeks    Status  New      PT LONG TERM GOAL #3   Title  Improve TUG score to less than or equal to 15 seconds for decreased fall risk.    Baseline  21    Time  8    Period  Weeks    Status  New      PT LONG TERM GOAL #4   Title  Pt's FGA score will improve to >/=19/30 to decr. falls risk.    Baseline  9/30 FGA no AD on 09/17/19    Time  8    Period  Weeks    Status  Revised      PT LONG TERM GOAL #5   Title  UPDATED GOAL:  ambulate at least 1000 ft indoor/outdoor surfaces with supervision and LRAD    Time  8    Period  Weeks    Status  New  Plan - 10/07/19 2045    Clinical Impression Statement  Focus of today's session was LE strengthening and gait training with trial of foot up brace and R AFO for decreased foot clearance and ankle DF on R, especially when ambulating with no  AD. With R [place] Walk On AFO pt demonstrated improved foot clearance and heel > toe pattern-subjectively reporting and demonstrating improved balance and stability. Would prefer the AFO vs. the foot up brace. Will continue to practice with R AFO at next session and therapist will look into an orthotic consult for brace. Began to check pt's STGs, pt has met STG #1 in regards to initial HEP and STG #2 in regards to TUG time with no AD. However pt's TUG time still indicates an increased risk for falls. Will continue to progress towards LTGs.    Personal Factors and Comorbidities  Comorbidity 3+    Comorbidities  CVA X2 with Rt hemiparesis, history of DM, HTN, obesity, HLD    Examination-Activity Limitations  Locomotion Level;Transfers;Bend;Carry;Squat;Stairs;Lift;Stand    Examination-Participation Restrictions  Meal Prep;Cleaning;Community Activity;Shop;Laundry    Stability/Clinical Decision Making  Evolving/Moderate complexity    Rehab Potential  Good    PT Frequency  2x / week    PT Duration  8 weeks    PT Treatment/Interventions  ADLs/Self Care Home Management;Aquatic Therapy;Electrical Stimulation;Neuromuscular re-education;Balance training;Therapeutic exercise;Therapeutic activities;Stair training;Gait training;Patient/family education;Orthotic Fit/Training;Manual techniques;Energy conservation;Passive range of motion;Dry needling    PT Next Visit Plan  continue to practice with R AFO - then ask for orthotic consult? continue gait training, RLE strengthening, and balance. add to HEP as appropriate.    PT Home Exercise Plan  [email or web address]    Consulted and Agree with Plan of Care  Patient;Family member/caregiver    Family Member Consulted  daughter       Patient will benefit from skilled therapeutic intervention in order to improve the following deficits and impairments:  Abnormal gait, Decreased activity tolerance, Decreased balance, Decreased coordination, Decreased endurance, Decreased mobility,  Decreased strength, Difficulty walking, Pain, Impaired vision/preception  Visit Diagnosis: Hemiplegia and hemiparesis following cerebral infarction affecting right dominant side (HCC)  Muscle weakness (generalized)  Other abnormalities of gait and mobility     Problem List Patient Active Problem List   Diagnosis Date Noted  . Intracranial vascular stenosis 12/05/2016  . Depression 12/05/2016  . Seizure-like activity ([place]) 06/28/2016  . 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 Gait difficulty   . Memory loss 05/22/2016  . Confusion   . Acute ischemic stroke ([place]) 05/18/2016  . Morbid obesity ([place]) 09/30/2015  . Diabetes mellitus ([place])   . Essential hypertension   . Hyperlipidemia   . Cerebrovascular accident (CVA) due to thrombosis of cerebral artery ([place])   . CVA (cerebral infarction) 08/07/2015  . Pure hypercholesterolemia 10/04/2009    Arliss Journey, PT, DPT 10/07/2019, 8:46 PM  [facility] [address] [place], [name], [address] Phone: [phone]   Fax:  [phone]  Name: GENEEN DIETER MRN: [id number] Date of Birth: 1966-01-04

## 2019-10-14 ENCOUNTER — Ambulatory Visit: Payer: Medicare Other | Admitting: Physical Therapy

## 2019-10-14 ENCOUNTER — Other Ambulatory Visit: Payer: Self-pay

## 2019-10-14 DIAGNOSIS — R2689 Other abnormalities of gait and mobility: Secondary | ICD-10-CM

## 2019-10-14 DIAGNOSIS — M6281 Muscle weakness (generalized): Secondary | ICD-10-CM

## 2019-10-14 DIAGNOSIS — I69351 Hemiplegia and hemiparesis following cerebral infarction affecting right dominant side: Secondary | ICD-10-CM

## 2019-10-15 NOTE — Therapy (Signed)
[facility] [address] [place], [name], [address] Phone: [phone]   Fax:  [phone]  Physical Therapy Treatment  Patient Details  Name: Annette Munoz MRN: [id number] Date of Birth: Jan 30, 1966 Referring Provider (PT): Elsie Saas, MD   Encounter Date: 10/14/2019  PT End of Session - 10/15/19 1213    Visit Number  5    Number of Visits  16    Date for PT Re-Evaluation  10/28/19    Authorization Type  MCR    PT Start Time  1638   pt arrived to PT session late, unable to reschedule and wishes to continue with session   PT Stop Time  1701    PT Time Calculation (min)  23 min    Equipment Utilized During Treatment  Gait belt    Activity Tolerance  Patient tolerated treatment well    Behavior During Therapy  [facility] for tasks assessed/performed       Past Medical History:  Diagnosis Date  . Diabetes mellitus without complication ([place])   . Hypertension   . Stroke [facility])     Past Surgical History:  Procedure Laterality Date  . CESAREAN SECTION    . TEE WITHOUT CARDIOVERSION N/A 08/10/2015   Procedure: TRANSESOPHAGEAL ECHOCARDIOGRAM (TEE);  Surgeon: [name], MD;  Location: [facility] ENDOSCOPY;  Service: Cardiovascular;  Laterality: N/A;    There were no vitals filed for this visit.  Subjective Assessment - 10/14/19 1641    Subjective  Did a lot of walking today - just came back from the hospital (visiting a family member in cardiac rehab)   Patient is accompained by:  Family member   dtr: Annette Munoz   Patient Stated Goals  strengthen Rt side and walk better    Currently in Pain?  No/denies         [facility] PT Assessment - 10/14/19 1642      Functional Gait  Assessment   Gait assessed   Yes    Gait Level Surface  Walks 20 ft, slow speed, abnormal gait pattern, evidence for imbalance or deviates 10-15 in outside of the 12 in walkway width. Requires more than 7 sec to ambulate 20 ft.   14 seconds   Change  in Gait Speed  Makes only minor adjustments to walking speed, or accomplishes a change in speed with significant gait deviations, deviates 10-15 in outside the 12 in walkway width, or changes speed but loses balance but is able to recover and continue walking.    Gait with Horizontal Head Turns  Performs head turns with moderate changes in gait velocity, slows down, deviates 10-15 in outside 12 in walkway width but recovers, can continue to walk.    Gait with Vertical Head Turns  Performs task with moderate change in gait velocity, slows down, deviates 10-15 in outside 12 in walkway width but recovers, can continue to walk.    Gait and Pivot Turn  Pivot turns safely in greater than 3 sec and stops with no loss of balance, or pivot turns safely within 3 sec and stops with mild imbalance, requires small steps to catch balance.    Step Over Obstacle  Is able to step over one shoe box (4.5 in total height) but must slow down and adjust steps to clear box safely. May require verbal cueing.    Gait with Narrow Base of Support  Ambulates less than 4 steps heel to toe or cannot perform without assistance.    Gait  with Eyes Closed  Walks 20 ft, slow speed, abnormal gait pattern, evidence for imbalance, deviates 10-15 in outside 12 in walkway width. Requires more than 9 sec to ambulate 20 ft.    Ambulating Backwards  Walks 20 ft, slow speed, abnormal gait pattern, evidence for imbalance, deviates 10-15 in outside 12 in walkway width.    Steps  Two feet to a stair, must use rail.    Total Score  10    FGA comment:  10/30                   [facility] Adult PT Treatment/Exercise - 10/14/19 1642      Ambulation/Gait   Ambulation/Gait  Yes    Ambulation/Gait Assistance  5: Supervision    Ambulation/Gait Assistance Details  without AD throughout session, performed FGA without AD and R AFO. Pt demonstrating decreased R foot clearance throughout. Trialed gait again today with R AFO (Ottobock Walk On), pt with  no change in gait speed with AFO, continued cues for heel > toe pattern. Pt reporting that she does not feel confident  in walking with this brace and feels like she might fall. No episodes of LOB throughout gait. Will continue to try to trial a different AFO or try again with foot-up brace at next session.     Ambulation Distance (Feet)  300 Feet    Assistive device  None;Other (Comment)   R AFO   Gait Pattern  Step-through pattern;Decreased stance time - right;Decreased stride length;Decreased dorsiflexion - right;Decreased weight shift to right;Poor foot clearance - right;Wide base of support    Ambulation Surface  Level;Indoor    Gait velocity  1.37 ft/sec without AD   1.35 ft/sec without AD and R AFO              PT Short Term Goals - 10/14/19 1701      PT SHORT TERM GOAL #1   Title  Pt/family verblaize/demo HEP for strength, balance, gait.  TARGET 10/01/19    Time  4    Period  Weeks    Status  Achieved      PT SHORT TERM GOAL #2   Title  Pt will imporve TUG from 21 seconds to less than 18 seconds no AD    Baseline  17.78 seconds on 10/07/19    Time  4    Period  Weeks    Status  Achieved      PT SHORT TERM GOAL #3   Title  Pt will improve FGA score to >/=13/30 to decr. falls risk.    Baseline  11/30    Time  4    Period  Weeks    Status  Not Met        PT Long Term Goals - 09/18/19 1745      PT LONG TERM GOAL #1   Title  Pt/family will verbalize understanding of final HEP and continued exercise plan post PT discharge  TARGET for all LTG 8 weeks 10/28/19)    Time  8    Period  Weeks    Status  New      PT LONG TERM GOAL #2   Title  UPDATED GOAL:  Pt will improved gait velocity to at least 1.8 ft/sec LRAD for improved mobility.    Baseline  1.5 ft/sec    Time  8    Period  Weeks    Status  New      PT LONG TERM GOAL #3  Title  Improve TUG score to less than or equal to 15 seconds for decreased fall risk.    Baseline  21    Time  8    Period  Weeks     Status  New      PT LONG TERM GOAL #4   Title  Pt's FGA score will improve to >/=19/30 to decr. falls risk.    Baseline  9/30 FGA no AD on 09/17/19    Time  8    Period  Weeks    Status  Revised      PT LONG TERM GOAL #5   Title  UPDATED GOAL:  ambulate at least 1000 ft indoor/outdoor surfaces with supervision and LRAD    Time  8    Period  Weeks    Status  New           10/16/19 0800  Plan  Clinical Impression Statement Pt arrived late to PT session today. Advised pt to reschedule a time later this week, however today is the only day they could come - pt stating she would still like to participate in therapy today. Focus of today's session was checking remaining STGs. Pt scored a 10/30 on the FGA with no AD (previously 9/30 at eval) - indicating a high risk for falls and did not meet her STG. Trialed R Ottobock WalkOn AFO today with gait with no AD, no real change in gait speed between ambulating with AFO and without AFO (1.35 ft/sec and 1.37 ft/sec). Pt's gait speed indicates that pt is at a higher risk for falls. During ambulation with AFO pt reported feeling an increase fear of falling. Will continue to trial any additional AFOs and foot-up brace. Will continue to progress towards LTGs.  Personal Factors and Comorbidities Comorbidity 3+  Comorbidities CVA X2 with Rt hemiparesis, history of DM, HTN, obesity, HLD  Examination-Activity Limitations Locomotion Level;Transfers;Bend;Carry;Squat;Stairs;Lift;Stand  Examination-Participation Restrictions Meal Prep;Cleaning;Community Activity;Shop;Laundry  Pt will benefit from skilled therapeutic intervention in order to improve on the following deficits Abnormal gait;Decreased activity tolerance;Decreased balance;Decreased coordination;Decreased endurance;Decreased mobility;Decreased strength;Difficulty walking;Pain;Impaired vision/preception  Stability/Clinical Decision Making Evolving/Moderate complexity  Rehab Potential Good  PT Frequency  2x / week  PT Duration 8 weeks  PT Treatment/Interventions ADLs/Self Care Home Management;Aquatic Therapy;Electrical Stimulation;Neuromuscular re-education;Balance training;Therapeutic exercise;Therapeutic activities;Stair training;Gait training;Patient/family education;Orthotic Fit/Training;Manual techniques;Energy conservation;Passive range of motion;Dry needling  PT Next Visit Plan continue to practice with R AFO and trail foot up brace again - then ask for orthotic consult? continue gait training, RLE strengthening, and balance. add to HEP as appropriate.  PT Home Exercise Plan [id number]  Consulted and Agree with Plan of Care Patient;Family member/caregiver  Family Member Consulted daughter        Patient will benefit from skilled therapeutic intervention in order to improve the following deficits and impairments:     Visit Diagnosis: Hemiplegia and hemiparesis following cerebral infarction affecting right dominant side (HCC)  Muscle weakness (generalized)  Other abnormalities of gait and mobility     Problem List Patient Active Problem List   Diagnosis Date Noted  . Intracranial vascular stenosis 12/05/2016  . Depression 12/05/2016  . Seizure-like activity ([place]) 06/28/2016  . 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 Gait difficulty   . Memory loss 05/22/2016  . Confusion   . Acute ischemic stroke ([place]) 05/18/2016  . Morbid obesity ([place]) 09/30/2015  . Diabetes mellitus ([place])   . Essential hypertension   . Hyperlipidemia   . Cerebrovascular  accident (CVA) due to thrombosis of cerebral artery ([place])   . CVA (cerebral infarction) 08/07/2015  . Pure hypercholesterolemia 10/04/2009    Arliss Journey, PT, DPT  10/15/2019, 12:23 PM  [facility] [address] [place], [name], [address] Phone: [phone]   Fax:  [phone]  Name: ROSEANN KEES MRN: [id number] Date of Birth:  Mar 27, 1966

## 2019-10-21 ENCOUNTER — Other Ambulatory Visit: Payer: Self-pay

## 2019-10-21 ENCOUNTER — Ambulatory Visit: Payer: Medicare HMO | Attending: Orthopedic Surgery | Admitting: Physical Therapy

## 2019-10-21 DIAGNOSIS — M6281 Muscle weakness (generalized): Secondary | ICD-10-CM | POA: Diagnosis not present

## 2019-10-21 DIAGNOSIS — I69351 Hemiplegia and hemiparesis following cerebral infarction affecting right dominant side: Secondary | ICD-10-CM | POA: Insufficient documentation

## 2019-10-21 DIAGNOSIS — R2689 Other abnormalities of gait and mobility: Secondary | ICD-10-CM | POA: Diagnosis not present

## 2019-10-22 ENCOUNTER — Telehealth: Payer: Self-pay | Admitting: Physical Therapy

## 2019-10-22 NOTE — Telephone Encounter (Signed)
Dr. Thurston Hole,   Derwood Kaplan. South is being treated by physical therapy at [facility].  Pt will benefit from use of AFO due to decreased R foot clearance and DF during gait in order to improve safety with functional mobility and decrease risk of falls.    If you agree, please submit request in EPIC under MD Order, Other Orders (list Right AFO in comments) or fax to [facility] at [phone].   Thank you, Sherlie Ban, PT, DPT 10/22/19 8:23 AM

## 2019-10-22 NOTE — Therapy (Signed)
[facility] [address] [place], [name], [address] Phone: [phone]   Fax:  [phone]  Physical Therapy Treatment  Patient Details  Name: Annette Munoz MRN: [id number] Date of Birth: 18-Dec-1965 Referring Provider (PT): Elsie Saas, MD   Encounter Date: 10/21/2019  PT End of Session - 10/22/19 0804    Visit Number  6    Number of Visits  16    Date for PT Re-Evaluation  10/28/19    Authorization Type  MCR    PT Start Time  1618    PT Stop Time  1700    PT Time Calculation (min)  42 min    Equipment Utilized During Treatment  Gait belt    Activity Tolerance  Patient tolerated treatment well    Behavior During Therapy  [facility] for tasks assessed/performed       Past Medical History:  Diagnosis Date  . Diabetes mellitus without complication ([place])   . Hypertension   . Stroke [facility])     Past Surgical History:  Procedure Laterality Date  . CESAREAN SECTION    . TEE WITHOUT CARDIOVERSION N/A 08/10/2015   Procedure: TRANSESOPHAGEAL ECHOCARDIOGRAM (TEE);  Surgeon: [name], MD;  Location: [facility] ENDOSCOPY;  Service: Cardiovascular;  Laterality: N/A;    There were no vitals filed for this visit.  Subjective Assessment - 10/21/19 1620    Subjective  [place] a [place] with her SPC. had 1 fall today - was going up 10 stairs, tripped over R foot. Got up on her own. Goes up and down the stairs with her SPC. Fell on her right knee.    Patient is accompained by:  Family member   dtr: Annette Munoz   Patient Stated Goals  strengthen Rt side and walk better    Currently in Pain?  No/denies                       [facility] Adult PT Treatment/Exercise - 10/22/19 0001      Ambulation/Gait   Ambulation/Gait  Yes    Ambulation/Gait Assistance  5: Supervision    Ambulation/Gait Assistance Details  Patient continues to demonstrate decreased R foot clearance, trialed foot-up brace again today  with pt still demonstrating decreased foot clearance on RLE even with cueing for heel > toe pattern. If pt not paying attention to RLE, pt does not DF ankle during gait. Ambulated with [facility] throughout session. Discussed with pt and pt's daughter about sending in referral for an AFO due to impaired foot clearance and to decrease risk of falls.     Ambulation Distance (Feet)  200 Feet    Assistive device  Straight cane;Other (Comment)   with R foot up brace   Gait Pattern  Step-through pattern;Decreased stance time - right;Decreased stride length;Decreased dorsiflexion - right;Decreased weight shift to right;Poor foot clearance - right;Wide base of support    Ambulation Surface  Level;Indoor    Stairs  Yes    Stairs Assistance  5: Supervision    Stairs Assistance Details (indicate cue type and reason)  Pt demonstrating how she ambulates up and down stairs at home - with cane in RUE (based on where pt's railing is at home) and L UE on handrail and alternating reciprocal pattern descending and step to pattern descending with LLE leading. Due to recent fall on stairs, educated pt on proper sequencing on step to pattern to ascend (due to poor foot clearance  of RLE when ascending) and step to pattern descending with SPC and stepping down first with weaker leg. Pt needing multiple verbal cues from therapist and pt's daughter for proper technique, however after a couple reps pt able to verbalize and demonstrate understanding.     Stair Management Technique  One rail Left;With cane;Step to pattern;Forwards    Number of Stairs  12    Height of Stairs  6    Ramp  5: Supervision    Ramp Details (indicate cue type and reason)  with SPC    Curb  5: Supervision    Curb Details (indicate cue type and reason)  performed after stair training - needed initial verbal cues for technique, x4 reps, by last 2 reps pt able to verbalize and demonstrate proper sequencing       Exercises   Exercises  Other Exercises    Other  Exercises   In // bars: standing hip and ankle DF off of 6" block with RLE 2 x 10 reps, 1 x 10 reps step ups onto 4" block with RLE leading cues for increased hip and knee flexion to avoid compensatory circumduction, stepping over smaller black foam disc and aiming heel for green disc on floor, cues for increased R hip and knee flexion and DF activation 1 x 10 reps. In supine: 1 x 10 reps supine slow marching on RLE with cues for holding in hip flexion and DF activation - added to HEP.              PT Education - 10/22/19 0804    Education Details  AFO prescription    Person(s) Educated  Patient;Child(ren)    Methods  Explanation    Comprehension  Verbalized understanding       PT Short Term Goals - 10/14/19 1701      PT SHORT TERM GOAL #1   Title  Pt/family verblaize/demo HEP for strength, balance, gait.  TARGET 10/01/19    Time  4    Period  Weeks    Status  Achieved      PT SHORT TERM GOAL #2   Title  Pt will imporve TUG from 21 seconds to less than 18 seconds no AD    Baseline  17.78 seconds on 10/07/19    Time  4    Period  Weeks    Status  Achieved      PT SHORT TERM GOAL #3   Title  Pt will improve FGA score to >/=13/30 to decr. falls risk.    Baseline  11/30    Time  4    Period  Weeks    Status  Not Met        PT Long Term Goals - 09/18/19 1745      PT LONG TERM GOAL #1   Title  Pt/family will verbalize understanding of final HEP and continued exercise plan post PT discharge  TARGET for all LTG 8 weeks 10/28/19)    Time  8    Period  Weeks    Status  New      PT LONG TERM GOAL #2   Title  UPDATED GOAL:  Pt will improved gait velocity to at least 1.8 ft/sec LRAD for improved mobility.    Baseline  1.5 ft/sec    Time  8    Period  Weeks    Status  New      PT LONG TERM GOAL #3   Title  Improve TUG score to  less than or equal to 15 seconds for decreased fall risk.    Baseline  21    Time  8    Period  Weeks    Status  New      PT LONG TERM GOAL #4    Title  Pt's FGA score will improve to >/=19/30 to decr. falls risk.    Baseline  9/30 FGA no AD on 09/17/19    Time  8    Period  Weeks    Status  Revised      PT LONG TERM GOAL #5   Title  UPDATED GOAL:  ambulate at least 1000 ft indoor/outdoor surfaces with supervision and LRAD    Time  8    Period  Weeks    Status  New            Plan - 10/22/19 0815    Clinical Impression Statement  Focus of today's skilled session focused on gait and stair training and LE strengthening (with focus on R hip flexors and ankle DF). Practiced again today with R foot up brace, with pt still demonstrating decreased foot clearance during gait throughout session with SPC. Will ask for referral for orthotic consult for AFO. Pt needed frequent verbal cueing for proper sequencing for stair and curb training and needs instruction to stay focused to the task at hand. Pt still has tendency to ambulate with cane in RUE (pt's affected side, but pt's dominate hand), but demonstrates a more smooth step through gait pattern when cued to switch cane to LUE (stronger side). Will continue to progress towards LTGs.    Personal Factors and Comorbidities  Comorbidity 3+    Comorbidities  CVA X2 with Rt hemiparesis, history of DM, HTN, obesity, HLD    Examination-Activity Limitations  Locomotion Level;Transfers;Bend;Carry;Squat;Stairs;Lift;Stand    Examination-Participation Restrictions  Meal Prep;Cleaning;Community Activity;Shop;Laundry    Stability/Clinical Decision Making  Evolving/Moderate complexity    Rehab Potential  Good    PT Frequency  2x / week    PT Duration  8 weeks    PT Treatment/Interventions  ADLs/Self Care Home Management;Aquatic Therapy;Electrical Stimulation;Neuromuscular re-education;Balance training;Therapeutic exercise;Therapeutic activities;Stair training;Gait training;Patient/family education;Orthotic Fit/Training;Manual techniques;Energy conservation;Passive range of motion;Dry needling    PT  Next Visit Plan  asked for referral for AFO due to decreased R foot clearance during gait. continue gait training, RLE strengthening, and balance. add to HEP as appropriate.    PT Home Exercise Plan  [email or web address]    Consulted and Agree with Plan of Care  Patient;Family member/caregiver    Family Member Consulted  daughter       Patient will benefit from skilled therapeutic intervention in order to improve the following deficits and impairments:  Abnormal gait, Decreased activity tolerance, Decreased balance, Decreased coordination, Decreased endurance, Decreased mobility, Decreased strength, Difficulty walking, Pain, Impaired vision/preception  Visit Diagnosis: Hemiplegia and hemiparesis following cerebral infarction affecting right dominant side (HCC)  Muscle weakness (generalized)  Other abnormalities of gait and mobility     Problem List Patient Active Problem List   Diagnosis Date Noted  . Intracranial vascular stenosis 12/05/2016  . Depression 12/05/2016  . Seizure-like activity ([place]) 06/28/2016  . 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 Gait difficulty   . Memory loss 05/22/2016  . Confusion   . Acute ischemic stroke ([place]) 05/18/2016  . Morbid obesity ([place]) 09/30/2015  . Diabetes mellitus ([place])   . Essential hypertension   . Hyperlipidemia   . Cerebrovascular accident (CVA)  due to thrombosis of cerebral artery ([place])   . CVA (cerebral infarction) 08/07/2015  . Pure hypercholesterolemia 10/04/2009    Arliss Journey, PT, DPT  10/22/2019, 8:18 AM  [facility] [address] [place], [name], [address] Phone: [phone]   Fax:  [phone]  Name: Annette Munoz MRN: [id number] Date of Birth: 07/13/1966

## 2019-10-23 ENCOUNTER — Other Ambulatory Visit: Payer: Self-pay

## 2019-10-23 ENCOUNTER — Ambulatory Visit: Payer: Medicare HMO

## 2019-10-23 DIAGNOSIS — R2689 Other abnormalities of gait and mobility: Secondary | ICD-10-CM

## 2019-10-23 DIAGNOSIS — M6281 Muscle weakness (generalized): Secondary | ICD-10-CM | POA: Diagnosis not present

## 2019-10-23 DIAGNOSIS — I69351 Hemiplegia and hemiparesis following cerebral infarction affecting right dominant side: Secondary | ICD-10-CM | POA: Diagnosis not present

## 2019-10-23 NOTE — Therapy (Signed)
[facility] [address] [place], [name], [address] Phone: [phone]   Fax:  [phone]  Physical Therapy Treatment  Patient Details  Name: Annette Munoz MRN: [id number] Date of Birth: 11-03-1965 Referring Provider (PT): Elsie Saas, MD   Encounter Date: 10/23/2019  PT End of Session - 10/23/19 1621    Visit Number  7    Number of Visits  16    Date for PT Re-Evaluation  10/28/19    Authorization Type  MCR    PT Start Time  1618    PT Stop Time  1659    PT Time Calculation (min)  41 min    Equipment Utilized During Treatment  Gait belt    Activity Tolerance  Patient tolerated treatment well    Behavior During Therapy  [facility] for tasks assessed/performed       Past Medical History:  Diagnosis Date  . Diabetes mellitus without complication ([place])   . Hypertension   . Stroke [facility])     Past Surgical History:  Procedure Laterality Date  . CESAREAN SECTION    . TEE WITHOUT CARDIOVERSION N/A 08/10/2015   Procedure: TRANSESOPHAGEAL ECHOCARDIOGRAM (TEE);  Surgeon: [name], MD;  Location: [facility] ENDOSCOPY;  Service: Cardiovascular;  Laterality: N/A;    There were no vitals filed for this visit.  Subjective Assessment - 10/23/19 1622    Subjective  Pt and daughter report that they have been working on the stair technique on her stairs at home and going well.    Patient is accompained by:  Family member   dtr: Annette Munoz   Patient Stated Goals  strengthen Rt side and walk better    Currently in Pain?  No/denies                       [facility] Adult PT Treatment/Exercise - 10/23/19 0001      Ambulation/Gait   Ambulation/Gait  Yes    Ambulation/Gait Assistance  5: Supervision    Ambulation/Gait Assistance Details  Pt was given verbal cues to increase right hip flexion with gait to help foot clearance.    Ambulation Distance (Feet)  230 Feet   115   Assistive device  Straight cane    Gait  Pattern  Step-through pattern;Decreased hip/knee flexion - right;Decreased dorsiflexion - right    Ambulation Surface  Level;Indoor    Gait Comments  Pt did need initial reminder to use cane in left UE      Exercises   Exercises  Other Exercises    Other Exercises   Along counter: marching gait 6' x 6, reciprocal steps over 4 cones tapping first x 4 with verbal cues for form, side stepping over 4 cones along counter x 4. Step-to 2nd step on stairs with 2# ankle weight on right leg x 10 with verbal cues to avoid circumducting, step-ups with right leg with 1 UE support on 6" step x 10. Clamshell with manual resistance on right 10 x 2 with verbal and tactile cues for form. Bridges with green theraband around thighs for more hip abd activation x 10 with bilateral hip abd/ER at top.             PT Education - 10/23/19 1718    Education Details  Added green theraband for clamshells and bridges with hip abd.    Person(s) Educated  Patient;Child(ren)    Methods  Explanation;Demonstration    Comprehension  Verbalized understanding;Returned demonstration  PT Short Term Goals - 10/14/19 1701      PT SHORT TERM GOAL #1   Title  Pt/family verblaize/demo HEP for strength, balance, gait.  TARGET 10/01/19    Time  4    Period  Weeks    Status  Achieved      PT SHORT TERM GOAL #2   Title  Pt will imporve TUG from 21 seconds to less than 18 seconds no AD    Baseline  17.78 seconds on 10/07/19    Time  4    Period  Weeks    Status  Achieved      PT SHORT TERM GOAL #3   Title  Pt will improve FGA score to >/=13/30 to decr. falls risk.    Baseline  11/30    Time  4    Period  Weeks    Status  Not Met        PT Long Term Goals - 09/18/19 1745      PT LONG TERM GOAL #1   Title  Pt/family will verbalize understanding of final HEP and continued exercise plan post PT discharge  TARGET for all LTG 8 weeks 10/28/19)    Time  8    Period  Weeks    Status  New      PT LONG TERM GOAL #2    Title  UPDATED GOAL:  Pt will improved gait velocity to at least 1.8 ft/sec LRAD for improved mobility.    Baseline  1.5 ft/sec    Time  8    Period  Weeks    Status  New      PT LONG TERM GOAL #3   Title  Improve TUG score to less than or equal to 15 seconds for decreased fall risk.    Baseline  21    Time  8    Period  Weeks    Status  New      PT LONG TERM GOAL #4   Title  Pt's FGA score will improve to >/=19/30 to decr. falls risk.    Baseline  9/30 FGA no AD on 09/17/19    Time  8    Period  Weeks    Status  Revised      PT LONG TERM GOAL #5   Title  UPDATED GOAL:  ambulate at least 1000 ft indoor/outdoor surfaces with supervision and LRAD    Time  [place] - 10/23/19 1720    Clinical Impression Statement  Session focused on right hip strengthening to work on improving right foot clearance with gait. Pt needed UE support for dynamic gait activities for stability.    Personal Factors and Comorbidities  Comorbidity 3+    Comorbidities  CVA X2 with Rt hemiparesis, history of DM, HTN, obesity, HLD    Examination-Activity Limitations  Locomotion Level;Transfers;Bend;Carry;Squat;Stairs;Lift;Stand    Examination-Participation Restrictions  Meal Prep;Cleaning;Community Activity;Shop;Laundry    Stability/Clinical Decision Making  Evolving/Moderate complexity    Rehab Potential  Good    PT Frequency  2x / week    PT Duration  8 weeks    PT Treatment/Interventions  ADLs/Self Care Home Management;Aquatic Therapy;Electrical Stimulation;Neuromuscular re-education;Balance training;Therapeutic exercise;Therapeutic activities;Stair training;Gait training;Patient/family education;Orthotic Fit/Training;Manual techniques;Energy conservation;Passive range of motion;Dry needling    PT Next Visit Plan  continue gait training, RLE strengthening, and balance. add to HEP as  appropriate.    PT Home Exercise Plan  [email or web address]    Consulted and Agree with  Plan of Care  Patient;Family member/caregiver    Family Member Consulted  daughter       Patient will benefit from skilled therapeutic intervention in order to improve the following deficits and impairments:  Abnormal gait, Decreased activity tolerance, Decreased balance, Decreased coordination, Decreased endurance, Decreased mobility, Decreased strength, Difficulty walking, Pain, Impaired vision/preception  Visit Diagnosis: Muscle weakness (generalized)  Other abnormalities of gait and mobility     Problem List Patient Active Problem List   Diagnosis Date Noted  . Intracranial vascular stenosis 12/05/2016  . Depression 12/05/2016  . Seizure-like activity ([place]) 06/28/2016  . 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 Gait difficulty   . Memory loss 05/22/2016  . Confusion   . Acute ischemic stroke ([place]) 05/18/2016  . Morbid obesity ([place]) 09/30/2015  . Diabetes mellitus ([place])   . Essential hypertension   . Hyperlipidemia   . Cerebrovascular accident (CVA) due to thrombosis of cerebral artery ([place])   . CVA (cerebral infarction) 08/07/2015  . Pure hypercholesterolemia 10/04/2009    Electa Sniff, PT, DPT, NCS 10/23/2019, 5:26 PM  [facility] [address] [place], [name], [address] Phone: [phone]   Fax:  [phone]  Name: Annette Munoz MRN: [id number] Date of Birth: [birth year]

## 2019-10-27 ENCOUNTER — Other Ambulatory Visit: Payer: Self-pay | Admitting: Surgical

## 2019-10-27 DIAGNOSIS — R269 Unspecified abnormalities of gait and mobility: Secondary | ICD-10-CM

## 2019-10-28 ENCOUNTER — Other Ambulatory Visit: Payer: Self-pay

## 2019-10-28 ENCOUNTER — Ambulatory Visit: Payer: Medicare HMO | Admitting: Physical Therapy

## 2019-10-28 DIAGNOSIS — I69351 Hemiplegia and hemiparesis following cerebral infarction affecting right dominant side: Secondary | ICD-10-CM | POA: Diagnosis not present

## 2019-10-28 DIAGNOSIS — M6281 Muscle weakness (generalized): Secondary | ICD-10-CM

## 2019-10-28 DIAGNOSIS — R2689 Other abnormalities of gait and mobility: Secondary | ICD-10-CM

## 2019-10-28 NOTE — Therapy (Signed)
[name] [address] [place]nette Munoz, [name], [address] Phone: [phone]   Fax:  [phone]  Physical Therapy Treatment/Re-Cert  Patient Details  Name: Annette Munoz MRN: [id number] Date of Birth: July 31, 1966 Referring Provider (PT): Elsie Saas, MD   Encounter Date: 10/28/2019  PT End of Session - 10/28/19 1711    Visit Number  8    Number of Visits  16    Date for PT Re-Evaluation  12/27/19    Authorization Type  MCR    PT Start Time  1619    PT Stop Time  1701    PT Time Calculation (min)  42 min    Equipment Utilized During Treatment  Gait belt    Activity Tolerance  Patient tolerated treatment well    Behavior During Therapy  [facility] for tasks assessed/performed       Past Medical History:  Diagnosis Date  . Diabetes mellitus without complication ([place])   . Hypertension   . Stroke [facility])     Past Surgical History:  Procedure Laterality Date  . CESAREAN SECTION    . TEE WITHOUT CARDIOVERSION N/A 08/10/2015   Procedure: TRANSESOPHAGEAL ECHOCARDIOGRAM (TEE);  Surgeon: [name], MD;  Location: [facility] ENDOSCOPY;  Service: Cardiovascular;  Laterality: N/A;    There were no vitals filed for this visit.  Subjective Assessment - 10/28/19 1622    Subjective  No falls. Was tired after last session. Got out of a low bath tub on her own.    Patient is accompained by:  Family member   dtr: Annette Munoz   Patient Stated Goals  strengthen Rt side and walk better    Currently in Pain?  No/denies         [facility] PT Assessment - 10/28/19 1640      Functional Gait  Assessment   Gait assessed   Yes    Gait Level Surface  Walks 20 ft, slow speed, abnormal gait pattern, evidence for imbalance or deviates 10-15 in outside of the 12 in walkway width. Requires more than 7 sec to ambulate 20 ft.    Change in Gait Speed  Makes only minor adjustments to walking speed, or accomplishes a change in speed with significant gait  deviations, deviates 10-15 in outside the 12 in walkway width, or changes speed but loses balance but is able to recover and continue walking.    Gait with Horizontal Head Turns  Performs head turns with moderate changes in gait velocity, slows down, deviates 10-15 in outside 12 in walkway width but recovers, can continue to walk.    Gait with Vertical Head Turns  Performs task with slight change in gait velocity (eg, minor disruption to smooth gait path), deviates 6 - 10 in outside 12 in walkway width or uses assistive device    Gait and Pivot Turn  Pivot turns safely in greater than 3 sec and stops with no loss of balance, or pivot turns safely within 3 sec and stops with mild imbalance, requires small steps to catch balance.    Step Over Obstacle  Is able to step over one shoe box (4.5 in total height) but must slow down and adjust steps to clear box safely. May require verbal cueing.    Gait with Narrow Base of Support  Ambulates less than 4 steps heel to toe or cannot perform without assistance.    Gait with Eyes Closed  Walks 20 ft, slow speed, abnormal gait pattern, evidence for imbalance, deviates  10-15 in outside 12 in walkway width. Requires more than 9 sec to ambulate 20 ft.    Ambulating Backwards  Walks 20 ft, uses assistive device, slower speed, mild gait deviations, deviates 6-10 in outside 12 in walkway width.    Steps  Two feet to a stair, must use rail.    Total Score  12    FGA comment:  12/30                   [facility] Adult PT Treatment/Exercise - 10/28/19 1640      Ambulation/Gait   Ambulation/Gait  Yes    Ambulation/Gait Assistance  5: Supervision    Ambulation/Gait Assistance Details  pt able to self correct today without cueing of keeping SPC in LUE    Ambulation Distance (Feet)  230 Feet    Assistive device  Straight cane    Gait Pattern  Step-through pattern;Decreased hip/knee flexion - right;Decreased dorsiflexion - right;Poor foot clearance - right;Wide base  of support    Ambulation Surface  Level;Indoor    Gait velocity  1.6 ft/sec with SPC     Stairs  Yes    Stairs Assistance  5: Supervision    Stairs Assistance Details (indicate cue type and reason)  needed initial cueing for proper technique of ascending with stronger LLE and descending with weaker RLE, daughter there to also provide verbal cues    Stair Management Technique  With cane;Step to pattern;Forwards    Number of Stairs  8    Height of Stairs  6      Therapeutic Activites    Other Therapeutic Activities  Reviewed safe floor <> stand transfers (to get in and out of the tub) - demonstrated how to safely perform half kneeling and using stronger LLE to help lower down to mat, x2 reps, provided education and reasoning to pt and pt's daughter on proper technique to safely perform at home. will need continued practice        Access Code: [email or web address]  URL: [email or web address]  Date: 10/28/2019  Prepared by: Janann August   Exercises Backwards Walking - 4 reps - 1 sets - 1x daily - 5x weekly Side Stepping with Counter Support - 4 reps - 1 sets - 1x daily - 5x weekly Standing Tandem Balance with Counter Support - 3 reps - 1 sets - 1x daily - 5x weekly Supine Bridge - 10 reps - 2 sets - 2x daily - 7x weekly Clamshell with Resistance - 10 reps - 2 sets - 2x daily - 7x weekly Sit to Stand without Arm Support - 10 reps - 2 sets - 1x daily - 7x weekly  New additions to HEP:  Heel Toe Raises with Counter Support - 10 reps - 2 sets - 2x daily - 7x weekly Standing March with Counter Support - 10 reps - 2 sets - 2x daily - 7x weekly      PT Education - 10/28/19 1710    Education Details  new additions to HEP, progress towards goals, fall risk    Person(s) Educated  Patient;Child(ren)    Methods  Explanation;Demonstration;Handout    Comprehension  Verbalized understanding;Returned demonstration;Need further instruction       PT Short Term Goals - 10/14/19 1701       PT SHORT TERM GOAL #1   Title  Pt/family verblaize/demo HEP for strength, balance, gait.  TARGET 10/01/19    Time  4    Period  Weeks    Status  Achieved  PT SHORT TERM GOAL #2   Title  Pt will imporve TUG from 21 seconds to less than 18 seconds no AD    Baseline  17.78 seconds on 10/07/19    Time  4    Period  Weeks    Status  Achieved      PT SHORT TERM GOAL #3   Title  Pt will improve FGA score to >/=13/30 to decr. falls risk.    Baseline  11/30    Time  4    Period  Weeks    Status  Not Met        PT Long Term Goals - 10/28/19 1623      PT LONG TERM GOAL #1   Title  Pt/family will verbalize understanding of final HEP and continued exercise plan post PT discharge  TARGET for all LTG 8 weeks 10/28/19)    Baseline  pt states that she is performing her exercise program daily.    Time  8    Period  Weeks    Status  Achieved      PT LONG TERM GOAL #2   Title  UPDATED GOAL:  Pt will improved gait velocity to at least 1.8 ft/sec LRAD for improved mobility.    Baseline  1.6 ft/sec with SPC on 10/28/19    Time  8    Period  Weeks    Status  Not Met      PT LONG TERM GOAL #3   Title  Improve TUG score to less than or equal to 15 seconds for decreased fall risk.    Baseline  18.8 seconds on 10/28/19    Time  8    Period  Weeks    Status  Not Met      PT LONG TERM GOAL #4   Title  Pt's FGA score will improve to >/=19/30 to decr. falls risk.    Baseline  12/30 with SPC on 10/28/19    Time  8    Period  Weeks    Status  Not Met      PT LONG TERM GOAL #5   Title  UPDATED GOAL:  ambulate at least 1000 ft indoor/outdoor surfaces with supervision and LRAD    Baseline  unable to assess today due to time constraints.    Time  8    Period  Weeks    Status  Deferred       Revised/On-going LTGs for - cert:      PT Long Term Goals - 10/28/19 1623      PT LONG TERM GOAL #1   Title  Pt/family will verbalize understanding of final HEP and continued exercise plan post PT  discharge  TARGET for all LTG 11/26/19    Baseline  pt will continue to benefit from upgrades/additions to HEP    Time  4    Period  Weeks    Status  On-going    Target Date  11/26/19      PT LONG TERM GOAL #2   Title  UPDATED GOAL:  Pt will improved gait velocity to at least 1.9 ft/sec LRAD for improved mobility and decr fall risk.    Baseline  1.6 ft/sec with SPC on 10/28/19    Time  4    Period  Weeks    Status  Revised      PT LONG TERM GOAL #3   Title  Improve TUG score to less than or equal to 15 seconds  using SPC for decreased fall risk.    Baseline  18.8 seconds on 10/28/19    Time  4    Period  Weeks    Status  Revised      PT LONG TERM GOAL #4   Title  Pt's FGA score will improve to >/=15/30 to decr. falls risk.    Baseline  12/30 with SPC on 10/28/19    Time  4    Period  Weeks    Status  Revised      PT LONG TERM GOAL #5   Title  UPDATED GOAL:  ambulate at least 1000 ft indoor/outdoor surfaces with supervision and LRAD    Baseline  unable to assess today due to time constraints.    Time  4    Period  Weeks    Status  On-going      Additional Long Term Goals   Additional Long Term Goals  Yes      PT LONG TERM GOAL #6   Title  Patient will negotiate 4 steps using SPC with supervision of daughter and no verbal cues for technique for improved safety with stair negotiation into/out of the home.    Time  4    Period  Weeks    Status  New         Plan - 10/29/19 0811    Clinical Impression Statement  Focus of today's skilled session was assessing pt's LTGs for re-cert. Pt has met 1 out of 5 LTGs in regards to performing HEP. Did not meet LTGs #2-#4. Pt's gait speed remains approx. the same at 1.6 ft/sec with SPC, indicating that pt is a recurrent fall risk. Pt's FGA score when performed with [facility] is a 12/30 - also indicating pt is at a high risk for falls. Educated on continued use of cane for gait due to fall risk. Pt's TUG score was 18.8 seconds with SPC. Unable to  assess gait goal on indoor/outdoor surfaces today due to time constraints. Revised LTGs as needed. Will continue with POC for 2x week for 4 weeks. Recently received an order for R AFO, will further discuss with pt and pt's daughter next visit about getting an appointment set up for AFO in order to improve gait mechanics, decr risk of falls, and improve R foot clearance.    Personal Factors and Comorbidities  Comorbidity 3+    Comorbidities  CVA X2 with Rt hemiparesis, history of DM, HTN, obesity, HLD    Examination-Activity Limitations  Locomotion Level;Transfers;Bend;Carry;Squat;Stairs;Lift;Stand    Examination-Participation Restrictions  Meal Prep;Cleaning;Community Activity;Shop;Laundry    Stability/Clinical Decision Making  Evolving/Moderate complexity    Rehab Potential  Good    PT Frequency  2x / week    PT Duration  4 weeks    PT Treatment/Interventions  ADLs/Self Care Home Management;Aquatic Therapy;Electrical Stimulation;Neuromuscular re-education;Balance training;Therapeutic exercise;Therapeutic activities;Stair training;Gait training;Patient/family education;Orthotic Fit/Training;Manual techniques;Energy conservation;Passive range of motion;Dry needling    PT Next Visit Plan  got order for AFO - need to look into getting appointment set up for pt. continue gait training, RLE strengthening, and balance. add to HEP as appropriate.    PT Home Exercise Plan  [email or web address]    Consulted and Agree with Plan of Care  Patient;Family member/caregiver    Family Member Consulted  daughter       Patient will benefit from skilled therapeutic intervention in order to improve the following deficits and impairments:  Abnormal gait, Decreased activity tolerance, Decreased balance, Decreased coordination, Decreased endurance, Decreased  mobility, Decreased strength, Difficulty walking, Pain, Impaired vision/preception  Visit Diagnosis: Muscle weakness (generalized)  Other abnormalities of gait and  mobility  Hemiplegia and hemiparesis following cerebral infarction affecting right dominant side [facility])     Problem List Patient Active Problem List   Diagnosis Date Noted  . Intracranial vascular stenosis 12/05/2016  . Depression 12/05/2016  . Seizure-like activity ([place]) 06/28/2016  . 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 Gait difficulty   . Memory loss 05/22/2016  . Confusion   . Acute ischemic stroke ([place]) 05/18/2016  . Morbid obesity ([place]) 09/30/2015  . Diabetes mellitus ([place])   . Essential hypertension   . Hyperlipidemia   . Cerebrovascular accident (CVA) due to thrombosis of cerebral artery ([employer])   . CVA (cerebral infarction) 08/07/2015  . Pure hypercholesterolemia 10/04/2009    Arliss Journey, PT, DPT  10/29/2019, 8:22 AM  [facility] [address] [place], [name], [address] Phone: [phone]   Fax:  [phone]  Name: DANAIJA ESKRIDGE MRN: [id number] Date of Birth: [birth year]

## 2019-10-28 NOTE — Patient Instructions (Signed)
Access Code: [email or web address]  URL: [email or web address]  Date: 10/28/2019  Prepared by: Sherlie Ban   Exercises Backwards Walking - 4 reps - 1 sets - 1x daily - 5x weekly Side Stepping with Counter Support - 4 reps - 1 sets - 1x daily - 5x weekly Standing Tandem Balance with Counter Support - 3 reps - 1 sets - 1x daily - 5x weekly Supine Bridge - 10 reps - 2 sets - 2x daily - 7x weekly Clamshell with Resistance - 10 reps - 2 sets - 2x daily - 7x weekly Sit to Stand without Arm Support - 10 reps - 2 sets - 1x daily - 7x weekly Supine March - 10 reps - 2 sets - 2x daily - 7x weekly Heel Toe Raises with Counter Support - 10 reps - 2 sets - 2x daily - 7x weekly Standing March with Counter Support - 10 reps - 2 sets - 2x daily - 7x weekly

## 2019-10-30 ENCOUNTER — Other Ambulatory Visit: Payer: Self-pay

## 2019-10-30 ENCOUNTER — Ambulatory Visit: Payer: Medicare HMO

## 2019-10-30 DIAGNOSIS — M6281 Muscle weakness (generalized): Secondary | ICD-10-CM | POA: Diagnosis not present

## 2019-10-30 DIAGNOSIS — R2689 Other abnormalities of gait and mobility: Secondary | ICD-10-CM

## 2019-10-30 DIAGNOSIS — I69351 Hemiplegia and hemiparesis following cerebral infarction affecting right dominant side: Secondary | ICD-10-CM | POA: Diagnosis not present

## 2019-10-30 NOTE — Therapy (Signed)
[facility] [address] [place], [name], [address] Phone: [phone]   Fax:  [phone]  Physical Therapy Treatment  Patient Details  Name: Annette Munoz MRN: [id number] Date of Birth: 07-Apr-1966 Referring Provider (PT): Elsie Saas, MD   Encounter Date: 10/30/2019  PT End of Session - 10/30/19 1622    Visit Number  9    Number of Visits  16    Date for PT Re-Evaluation  12/27/19    Authorization Type  MCR    PT Start Time  1620    PT Stop Time  1658    PT Time Calculation (min)  38 min    Equipment Utilized During Treatment  Gait belt    Activity Tolerance  Patient tolerated treatment well    Behavior During Therapy  [facility] for tasks assessed/performed       Past Medical History:  Diagnosis Date  . Diabetes mellitus without complication ([place])   . Hypertension   . Stroke [facility])     Past Surgical History:  Procedure Laterality Date  . CESAREAN SECTION    . TEE WITHOUT CARDIOVERSION N/A 08/10/2015   Procedure: TRANSESOPHAGEAL ECHOCARDIOGRAM (TEE);  Surgeon: [name], MD;  Location: [facility] ENDOSCOPY;  Service: Cardiovascular;  Laterality: N/A;    There were no vitals filed for this visit.  Subjective Assessment - 10/30/19 1624    Subjective  Pt doing okay. No falls. Pt quiet upon entering. Daughter reports that she didn't seem to want to exercise today.    Patient is accompained by:  Family member   dtr: Annette Munoz   Patient Stated Goals  strengthen Rt side and walk better    Currently in Pain?  No/denies                       [facility] Adult PT Treatment/Exercise - 10/30/19 1625      Ambulation/Gait   Ambulation/Gait  Yes    Ambulation/Gait Assistance  5: Supervision    Ambulation/Gait Assistance Details  Pt was reminded to use SPC in left hand    Ambulation Distance (Feet)  115 Feet    Assistive device  Straight cane    Gait Pattern  Step-through pattern;Decreased hip/knee flexion -  right;Decreased dorsiflexion - right;Wide base of support    Ambulation Surface  Level;Indoor    Stairs  Yes    Stairs Assistance  5: Supervision    Stairs Assistance Details (indicate cue type and reason)  Pt needed verbal cuing for proper sequence on steps.     Stair Management Technique  With cane;Step to pattern   right AFO   Number of Stairs  8    Gait Comments  230' with right PLS AFO trialed supervision. Pt able to clear right foot well but as foot drag occurs with longer distances hard to see big change. Pt reported AFO felt fine.      Neuro Re-ed    Neuro Re-ed Details   Standing on airex: feet together without UE support eyes closed x 30 sec, feet together with head turns left/right x 10, marching in place x 10 without UE support, staggered stance x 30 sec each position eyes open then eyes closed. Pt had slight increase sway with eyes closed but able to maintain on own.      Exercises   Exercises  Other Exercises    Other Exercises   Along counter: marching with 2# ankle weights 6' x 4, sidestepping with  2# ankle weights without UE support. Step-to 2nd step  with 2# ankle weight on right LE x 10, standing right hip extension x 10 with 2# ankle weight and verbal cues for form.               PT Short Term Goals - 10/14/19 1701      PT SHORT TERM GOAL #1   Title  Pt/family verblaize/demo HEP for strength, balance, gait.  TARGET 10/01/19    Time  4    Period  Weeks    Status  Achieved      PT SHORT TERM GOAL #2   Title  Pt will imporve TUG from 21 seconds to less than 18 seconds no AD    Baseline  17.78 seconds on 10/07/19    Time  4    Period  Weeks    Status  Achieved      PT SHORT TERM GOAL #3   Title  Pt will improve FGA score to >/=13/30 to decr. falls risk.    Baseline  11/30    Time  4    Period  Weeks    Status  Not Met        PT Long Term Goals - 10/28/19 1623      PT LONG TERM GOAL #1   Title  Pt/family will verbalize understanding of final HEP  and continued exercise plan post PT discharge  TARGET for all LTG 11/26/19    Baseline  pt will continue to benefit from upgrades/additions to HEP    Time  4    Period  Weeks    Status  On-going    Target Date  11/26/19      PT LONG TERM GOAL #2   Title  UPDATED GOAL:  Pt will improved gait velocity to at least 1.9 ft/sec LRAD for improved mobility and decr fall risk.    Baseline  1.6 ft/sec with SPC on 10/28/19    Time  4    Period  Weeks    Status  Revised      PT LONG TERM GOAL #3   Title  Improve TUG score to less than or equal to 15 seconds using SPC for decreased fall risk.    Baseline  18.8 seconds on 10/28/19    Time  4    Period  Weeks    Status  Revised      PT LONG TERM GOAL #4   Title  Pt's FGA score will improve to >/=15/30 to decr. falls risk.    Baseline  12/30 with SPC on 10/28/19    Time  4    Period  Weeks    Status  Revised      PT LONG TERM GOAL #5   Title  UPDATED GOAL:  ambulate at least 1000 ft indoor/outdoor surfaces with supervision and LRAD    Baseline  unable to assess today due to time constraints.    Time  4    Period  Weeks    Status  On-going      Additional Long Term Goals   Additional Long Term Goals  Yes      PT LONG TERM GOAL #6   Title  Patient will negotiate 4 steps using SPC with supervision of daughter and no verbal cues for technique for improved safety with stair negotiation into/out of the home.    Time  4    Period  Weeks    Status  New  Plan - 10/30/19 2029    Clinical Impression Statement  Pt with flat affect today with low motivation. She participated but just wanted to get through things to get done. Daughter reports she gets like this sometimes. PT trialed right PLS AFO. Pt did well with it but did not note significant change with shorter distance as seems to catch right foot more with longer walks. May be beneficial for longer walks in community. Daughter and patient in agreement to get set up with appointment  for orthotist.    Personal Factors and Comorbidities  Comorbidity 3+    Comorbidities  CVA X2 with Rt hemiparesis, history of DM, HTN, obesity, HLD    Examination-Activity Limitations  Locomotion Level;Transfers;Bend;Carry;Squat;Stairs;Lift;Stand    Examination-Participation Restrictions  Meal Prep;Cleaning;Community Activity;Shop;Laundry    Stability/Clinical Decision Making  Evolving/Moderate complexity    Rehab Potential  Good    PT Frequency  2x / week    PT Duration  4 weeks    PT Treatment/Interventions  ADLs/Self Care Home Management;Aquatic Therapy;Electrical Stimulation;Neuromuscular re-education;Balance training;Therapeutic exercise;Therapeutic activities;Stair training;Gait training;Patient/family education;Orthotic Fit/Training;Manual techniques;Energy conservation;Passive range of motion;Dry needling    PT Next Visit Plan  10th visit progress note. Need to set up appointment for AFO as patient and daughter will go to orthotist. continue gait training, RLE strengthening, and balance. add to HEP as appropriate.    PT Home Exercise Plan  [email or web address]    Consulted and Agree with Plan of Care  Patient;Family member/caregiver    Family Member Consulted  daughter       Patient will benefit from skilled therapeutic intervention in order to improve the following deficits and impairments:  Abnormal gait, Decreased activity tolerance, Decreased balance, Decreased coordination, Decreased endurance, Decreased mobility, Decreased strength, Difficulty walking, Pain, Impaired vision/preception  Visit Diagnosis: Muscle weakness (generalized)  Other abnormalities of gait and mobility     Problem List Patient Active Problem List   Diagnosis Date Noted  . Intracranial vascular stenosis 12/05/2016  . Depression 12/05/2016  . Seizure-like activity ([place]) 06/28/2016  . 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 Gait difficulty   . Memory loss 05/22/2016  . Confusion   .  Acute ischemic stroke ([place]) 05/18/2016  . Morbid obesity ([place]) 09/30/2015  . Diabetes mellitus ([place])   . Essential hypertension   . Hyperlipidemia   . Cerebrovascular accident (CVA) due to thrombosis of cerebral artery ([place])   . CVA (cerebral infarction) 08/07/2015  . Pure hypercholesterolemia 10/04/2009    Electa Sniff, PT, DPT, NCS 10/30/2019, 8:34 PM  [facility] [address] [place], [name], [address] Phone: [phone]   Fax:  [phone]  Name: Annette Munoz MRN: [id number] Date of Birth: 1966-06-07

## 2019-10-31 ENCOUNTER — Other Ambulatory Visit: Payer: Self-pay | Admitting: Orthopedic Surgery

## 2019-10-31 DIAGNOSIS — R269 Unspecified abnormalities of gait and mobility: Secondary | ICD-10-CM

## 2019-11-04 ENCOUNTER — Other Ambulatory Visit: Payer: Self-pay

## 2019-11-04 ENCOUNTER — Ambulatory Visit: Payer: Medicare HMO | Admitting: Physical Therapy

## 2019-11-04 DIAGNOSIS — R2689 Other abnormalities of gait and mobility: Secondary | ICD-10-CM

## 2019-11-04 DIAGNOSIS — M6281 Muscle weakness (generalized): Secondary | ICD-10-CM

## 2019-11-04 DIAGNOSIS — I69351 Hemiplegia and hemiparesis following cerebral infarction affecting right dominant side: Secondary | ICD-10-CM | POA: Diagnosis not present

## 2019-11-04 NOTE — Therapy (Signed)
[name], [name], [address] Phone: [phone]   Fax:  [phone]  Physical Therapy Treatment/10th Visit Progress Note  Patient Details  Name: Annette Munoz MRN: [id number] Date of Birth: 1966-05-06 Referring Provider (PT): Elsie Saas, MD   Encounter Date: 11/04/2019   Progress Note Reporting Period 09/02/19 to 11/04/19   See note below for Objective Data and Assessment of Progress/Goals.        PT End of Session - 11/04/19 2054    Visit Number  10    Number of Visits  16    Date for PT Re-Evaluation  12/27/19    Authorization Type  MCR    PT Start Time  1612    PT Stop Time  1658    PT Time Calculation (min)  46 min    Equipment Utilized During Treatment  Gait belt    Activity Tolerance  Patient tolerated treatment well    Behavior During Therapy  WFL for tasks assessed/performed       Past Medical History:  Diagnosis Date  . Diabetes mellitus without complication ([place])   . Hypertension   . Stroke [facility])     Past Surgical History:  Procedure Laterality Date  . CESAREAN SECTION    . TEE WITHOUT CARDIOVERSION N/A 08/10/2015   Procedure: TRANSESOPHAGEAL ECHOCARDIOGRAM (TEE);  Surgeon: [name], MD;  Location: [facility] ENDOSCOPY;  Service: Cardiovascular;  Laterality: N/A;    There were no vitals filed for this visit.  Subjective Assessment - 11/04/19 1614    Subjective  Almost tripped coming into therapy today. Has been doing the exercises at home. No falls.    Patient is accompained by:  Family member   dtr: Annette Munoz   Patient Stated Goals  strengthen Rt side and walk better    Currently in Pain?  No/denies                       [facility] Adult PT Treatment/Exercise - 11/04/19 0001      Ambulation/Gait   Ambulation/Gait  Yes    Ambulation/Gait Assistance  5: Supervision    Ambulation/Gait Assistance Details  pt able to use SPC consistently in LUE  throughout today's session    Ambulation Distance (Feet)  115 Feet    Assistive device  Straight cane    Gait Pattern  Step-through pattern;Decreased hip/knee flexion - right;Decreased dorsiflexion - right;Wide base of support    Ambulation Surface  Level;Indoor      Neuro Re-ed    Neuro Re-ed Details   Standing in corner on foam: wider BOS eyes closed 20-30 second reps, progressing to more narrow BOS with eyes closed and adding head turns 2 x 5 reps and nods 2 x 5 reps, feet together eyes closed 3 x 20-30 seconds - min guard for balance. Added static corner balance on pillow with eyes closed for HEP, with pt and daughter verbalizing understanding. On rockerboard: A/P weight shifting for hip strategy 1 x 20 reps progressing from BUE support to none, while trying to hold board still: 2 x 10 reps head turns, 2 x 10 reps head nods, multi-directional ball toss with daughter with therapist pt with a couple LOB posteriorly - requiring min A from therapist, as pt unable to use hip/ankle strategy to correct.       Exercises   Exercises  Other Exercises    Other Exercises   At staircase with 2lb weights on BLE:  2 x 10 reps alternating step taps to 6", with RLE placed on 6" step - 1 x 10 reps hip flexion raising and lowering back to step. With focus on increased hip flexion on R. At edge of mat table holding onto chair for balance: 2 x 10 reps mini squats with heel raise for push off during gait - verbal and demonstrative cues for technique.              PT Education - 11/04/19 2053    Education Details  making appointment with [facility] regarding AFO, corner balance addition to HEP    Person(s) Educated  Patient;Child(ren)    Methods  Explanation   provided business card for phone number   Comprehension  Verbalized understanding       PT Short Term Goals - 10/14/19 1701      PT SHORT TERM GOAL #1   Title  Pt/family verblaize/demo HEP for strength, balance, gait.  TARGET 10/01/19    Time  4     Period  Weeks    Status  Achieved      PT SHORT TERM GOAL #2   Title  Pt will imporve TUG from 21 seconds to less than 18 seconds no AD    Baseline  17.78 seconds on 10/07/19    Time  4    Period  Weeks    Status  Achieved      PT SHORT TERM GOAL #3   Title  Pt will improve FGA score to >/=13/30 to decr. falls risk.    Baseline  11/30    Time  4    Period  Weeks    Status  Not Met        PT Long Term Goals - 10/28/19 1623      PT LONG TERM GOAL #1   Title  Pt/family will verbalize understanding of final HEP and continued exercise plan post PT discharge  TARGET for all LTG 11/26/19    Baseline  pt will continue to benefit from upgrades/additions to HEP    Time  4    Period  Weeks    Status  On-going    Target Date  11/26/19      PT LONG TERM GOAL #2   Title  UPDATED GOAL:  Pt will improved gait velocity to at least 1.9 ft/sec LRAD for improved mobility and decr fall risk.    Baseline  1.6 ft/sec with SPC on 10/28/19    Time  4    Period  Weeks    Status  Revised      PT LONG TERM GOAL #3   Title  Improve TUG score to less than or equal to 15 seconds using SPC for decreased fall risk.    Baseline  18.8 seconds on 10/28/19    Time  4    Period  Weeks    Status  Revised      PT LONG TERM GOAL #4   Title  Pt's FGA score will improve to >/=15/30 to decr. falls risk.    Baseline  12/30 with SPC on 10/28/19    Time  4    Period  Weeks    Status  Revised      PT LONG TERM GOAL #5   Title  UPDATED GOAL:  ambulate at least 1000 ft indoor/outdoor surfaces with supervision and LRAD    Baseline  unable to assess today due to time constraints.    Time  4  Period  Weeks    Status  On-going      Additional Long Term Goals   Additional Long Term Goals  Yes      PT LONG TERM GOAL #6   Title  Patient will negotiate 4 steps using SPC with supervision of daughter and no verbal cues for technique for improved safety with stair negotiation into/out of the home.    Time  4     Period  Weeks    Status  New            Plan - 11/04/19 2058    Clinical Impression Statement  10th visit progress note: From re-cert on 6/50/35, Pt's gait speed remains approx. the same from eval at 1.6 ft/sec with SPC, indicating that pt is a recurrent fall risk. Pt's FGA score when performed with [facility] is a 12/30 - also indicating pt is at a high risk for falls. Educated on continued use of cane for gait due to fall risk. Pt's TUG score was 18.8 seconds with SPC. Pt continues to have decr ankle DF on RLE during gait, advised and sent referral to MD for AFO for foot clearance, especially with prolonged distances and when fatigued. Pt to make appointment with [facility]. Focus of today's session was dynamic balance on compliant surfaces and continued strengthening of BLE - most notably R hip flexion. Will continue to progress towards LTGs.    Personal Factors and Comorbidities  Comorbidity 3+    Comorbidities  CVA X2 with Rt hemiparesis, history of DM, HTN, obesity, HLD    Examination-Activity Limitations  Locomotion Level;Transfers;Bend;Carry;Squat;Stairs;Lift;Stand    Examination-Participation Restrictions  Meal Prep;Cleaning;Community Activity;Shop;Laundry    Stability/Clinical Decision Making  Evolving/Moderate complexity    Rehab Potential  Good    PT Frequency  2x / week    PT Duration  4 weeks    PT Treatment/Interventions  ADLs/Self Care Home Management;Aquatic Therapy;Electrical Stimulation;Neuromuscular re-education;Balance training;Therapeutic exercise;Therapeutic activities;Stair training;Gait training;Patient/family education;Orthotic Fit/Training;Manual techniques;Energy conservation;Passive range of motion;Dry needling    PT Next Visit Plan  did they schedule appointment with [facility]? continue gait training (with dynamic tasks), RLE strengthening, and balance. add to HEP as appropriate.    PT Home Exercise Plan  [email or web address]    Consulted and Agree with Plan of Care  Patient;Family  member/caregiver    Family Member Consulted  daughter       Patient will benefit from skilled therapeutic intervention in order to improve the following deficits and impairments:  Abnormal gait, Decreased activity tolerance, Decreased balance, Decreased coordination, Decreased endurance, Decreased mobility, Decreased strength, Difficulty walking, Pain, Impaired vision/preception  Visit Diagnosis: Muscle weakness (generalized)  Hemiplegia and hemiparesis following cerebral infarction affecting right dominant side ([facility])  Other abnormalities of gait and mobility     Problem List Patient Active Problem List   Diagnosis Date Noted  . Intracranial vascular stenosis 12/05/2016  . Depression 12/05/2016  . Seizure-like activity ([place]) 06/28/2016  . 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 Gait difficulty   . Memory loss 05/22/2016  . Confusion   . Acute ischemic stroke ([place]) 05/18/2016  . Morbid obesity ([place]) 09/30/2015  . Diabetes mellitus ([place])   . Essential hypertension   . Hyperlipidemia   . Cerebrovascular accident (CVA) due to thrombosis of cerebral artery ([place])   . CVA (cerebral infarction) 08/07/2015  . Pure hypercholesterolemia 10/04/2009    Arliss Journey, PT, DPT  11/04/2019, 9:07 PM  [facility] [id number]  [place], [name], [address] Phone: [phone]   Fax:  [phone]  Name: DAYANIRA GIOVANNETTI MRN: [id number] Date of Birth: August 14, 1966

## 2019-11-06 ENCOUNTER — Other Ambulatory Visit: Payer: Self-pay

## 2019-11-06 ENCOUNTER — Ambulatory Visit: Payer: Medicare HMO

## 2019-11-06 DIAGNOSIS — R2689 Other abnormalities of gait and mobility: Secondary | ICD-10-CM | POA: Diagnosis not present

## 2019-11-06 DIAGNOSIS — M6281 Muscle weakness (generalized): Secondary | ICD-10-CM

## 2019-11-06 DIAGNOSIS — I69351 Hemiplegia and hemiparesis following cerebral infarction affecting right dominant side: Secondary | ICD-10-CM | POA: Diagnosis not present

## 2019-11-06 NOTE — Therapy (Signed)
[facility] [address] [place], [name], [address] Phone: [phone]   Fax:  [phone]  Physical Therapy Treatment  Patient Details  Name: Annette Munoz MRN: [id number] Date of Birth: 05-Jan-1966 Referring Provider (PT): Elsie Saas, MD   Encounter Date: 11/06/2019  PT End of Session - 11/06/19 1610    Visit Number  11    Number of Visits  16    Date for PT Re-Evaluation  12/27/19    Authorization Type  MCR    PT Start Time  1605    PT Stop Time  1650    PT Time Calculation (min)  45 min    Equipment Utilized During Treatment  Gait belt    Activity Tolerance  Patient tolerated treatment well    Behavior During Therapy  [facility] for tasks assessed/performed       Past Medical History:  Diagnosis Date  . Diabetes mellitus without complication ([place])   . Hypertension   . Stroke [facility])     Past Surgical History:  Procedure Laterality Date  . CESAREAN SECTION    . TEE WITHOUT CARDIOVERSION N/A 08/10/2015   Procedure: TRANSESOPHAGEAL ECHOCARDIOGRAM (TEE);  Surgeon: [name], MD;  Location: [facility] ENDOSCOPY;  Service: Cardiovascular;  Laterality: N/A;    There were no vitals filed for this visit.  Subjective Assessment - 11/06/19 1610    Subjective  Pt reports she has been doing good. Denies any falls. Pt's daughter did speak to someone at [facility] and they said they need an order and for doctor to send over some info. Tried new balance exercise with daughter assist.    Patient is accompained by:  Family member   dtr: Annette Munoz   Patient Stated Goals  strengthen Rt side and walk better    Currently in Pain?  No/denies                       [facility] Adult PT Treatment/Exercise - 11/06/19 1611      Ambulation/Gait   Ambulation/Gait  Yes    Ambulation/Gait Assistance  5: Supervision    Ambulation/Gait Assistance Details  Pt was cued to try to increase step length    Ambulation Distance (Feet)   115 Feet    Assistive device  Straight cane    Gait Pattern  Step-through pattern;Decreased hip/knee flexion - right;Poor foot clearance - right;Decreased step length - right;Decreased step length - left    Ambulation Surface  Level;Indoor    Gait Comments  Pt with slower gait speed and even with cuing to try to move faster she stays at same pace      Neuro Re-ed    Neuro Re-ed Details   Standing on pillow eyes closed x 30 sec, eyes closed with head turns x 10 left/right and x 10 up/down, standing rockerboard positioned anterior/posterior eyes closed x 30 sec with some increased sway on board, then on rockerboard with holding ball in front raising overhead x 10. Standing on rockerboard positioned side to side holding ball in front and turning side to side for trunk rotation. Pt had more trouble keeping level with side to side position. Performed standing on rockerboard side to side maintaining level eyes closed with LOB to each side multiple times needing min assist. In // bars: marching over foam mat with 3# weight on right ankle with left UE support x 1 lap then with no UE support x 2 laps. Gait with SPC stepping over  6 varied height targets (2 yard sticks, 2 foams, 2 4" hurdles) x 3 laps first time with step to pattern and last 2 laps with reciprocal steps CGA. Stepping over and back of  4" foam beam x 10 with verbal and tactile cues not to twist but step right over.      Exercises   Exercises  Other Exercises    Other Exercises   Right hip flexion at stairs stepping to 2nd step with 3# ankle weight x 10.             PT Education - 11/06/19 1727    Education Details  Pt to continue with current HEP    Person(s) Educated  Patient    Methods  Explanation    Comprehension  Verbalized understanding       PT Short Term Goals - 10/14/19 1701      PT SHORT TERM GOAL #1   Title  Pt/family verblaize/demo HEP for strength, balance, gait.  TARGET 10/01/19    Time  4    Period  Weeks     Status  Achieved      PT SHORT TERM GOAL #2   Title  Pt will imporve TUG from 21 seconds to less than 18 seconds no AD    Baseline  17.78 seconds on 10/07/19    Time  4    Period  Weeks    Status  Achieved      PT SHORT TERM GOAL #3   Title  Pt will improve FGA score to >/=13/30 to decr. falls risk.    Baseline  11/30    Time  4    Period  Weeks    Status  Not Met        PT Long Term Goals - 10/28/19 1623      PT LONG TERM GOAL #1   Title  Pt/family will verbalize understanding of final HEP and continued exercise plan post PT discharge  TARGET for all LTG 11/26/19    Baseline  pt will continue to benefit from upgrades/additions to HEP    Time  4    Period  Weeks    Status  On-going    Target Date  11/26/19      PT LONG TERM GOAL #2   Title  UPDATED GOAL:  Pt will improved gait velocity to at least 1.9 ft/sec LRAD for improved mobility and decr fall risk.    Baseline  1.6 ft/sec with SPC on 10/28/19    Time  4    Period  Weeks    Status  Revised      PT LONG TERM GOAL #3   Title  Improve TUG score to less than or equal to 15 seconds using SPC for decreased fall risk.    Baseline  18.8 seconds on 10/28/19    Time  4    Period  Weeks    Status  Revised      PT LONG TERM GOAL #4   Title  Pt's FGA score will improve to >/=15/30 to decr. falls risk.    Baseline  12/30 with SPC on 10/28/19    Time  4    Period  Weeks    Status  Revised      PT LONG TERM GOAL #5   Title  UPDATED GOAL:  ambulate at least 1000 ft indoor/outdoor surfaces with supervision and LRAD    Baseline  unable to assess today due to time constraints.  Time  4    Period  Weeks    Status  On-going      Additional Long Term Goals   Additional Long Term Goals  Yes      PT LONG TERM GOAL #6   Title  Patient will negotiate 4 steps using SPC with supervision of daughter and no verbal cues for technique for improved safety with stair negotiation into/out of the home.    Time  4    Period  Weeks     Status  New            Plan - 11/06/19 1728    Clinical Impression Statement  Pt had improved stability standing on compliant surface trying to utilize vestibular system more. Was challenged on rockerboard, however, when positioned side to side unable to keep level as well. Pt continues to walk about same pace despite cuing to try to increase speed.    Personal Factors and Comorbidities  Comorbidity 3+    Comorbidities  CVA X2 with Rt hemiparesis, history of DM, HTN, obesity, HLD    Examination-Activity Limitations  Locomotion Level;Transfers;Bend;Carry;Squat;Stairs;Lift;Stand    Examination-Participation Restrictions  Meal Prep;Cleaning;Community Activity;Shop;Laundry    Stability/Clinical Decision Making  Evolving/Moderate complexity    Rehab Potential  Good    PT Frequency  2x / week    PT Duration  4 weeks    PT Treatment/Interventions  ADLs/Self Care Home Management;Aquatic Therapy;Electrical Stimulation;Neuromuscular re-education;Balance training;Therapeutic exercise;Therapeutic activities;Stair training;Gait training;Patient/family education;Orthotic Fit/Training;Manual techniques;Energy conservation;Passive range of motion;Dry needling    PT Next Visit Plan  [facility] update? continue gait training (with dynamic tasks), RLE strengthening, and balance. add to HEP as appropriate.    PT Home Exercise Plan  [email or web address]    Consulted and Agree with Plan of Care  Patient;Family member/caregiver    Family Member Consulted  daughter       Patient will benefit from skilled therapeutic intervention in order to improve the following deficits and impairments:  Abnormal gait, Decreased activity tolerance, Decreased balance, Decreased coordination, Decreased endurance, Decreased mobility, Decreased strength, Difficulty walking, Pain, Impaired vision/preception  Visit Diagnosis: Muscle weakness (generalized)  Other abnormalities of gait and mobility     Problem List Patient Active Problem  List   Diagnosis Date Noted  . Intracranial vascular stenosis 12/05/2016  . Depression 12/05/2016  . Seizure-like activity ([place]) 06/28/2016  . 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 Gait difficulty   . Memory loss 05/22/2016  . Confusion   . Acute ischemic stroke ([place]) 05/18/2016  . Morbid obesity ([place]) 09/30/2015  . Diabetes mellitus ([place])   . Essential hypertension   . Hyperlipidemia   . Cerebrovascular accident (CVA) due to thrombosis of cerebral artery ([place])   . CVA (cerebral infarction) 08/07/2015  . Pure hypercholesterolemia 10/04/2009    Electa Sniff, PT, DPT, NCS 11/06/2019, 5:31 PM  [facility] [address] [place], [name], [address] Phone: [phone]   Fax:  [phone]  Name: Annette Munoz MRN: [id number] Date of Birth: Sep 23, 1966

## 2019-11-11 ENCOUNTER — Telehealth: Payer: Self-pay

## 2019-11-11 ENCOUNTER — Ambulatory Visit: Payer: Medicare HMO

## 2019-11-11 ENCOUNTER — Other Ambulatory Visit: Payer: Self-pay

## 2019-11-11 DIAGNOSIS — R2689 Other abnormalities of gait and mobility: Secondary | ICD-10-CM | POA: Diagnosis not present

## 2019-11-11 DIAGNOSIS — M6281 Muscle weakness (generalized): Secondary | ICD-10-CM

## 2019-11-11 DIAGNOSIS — I69351 Hemiplegia and hemiparesis following cerebral infarction affecting right dominant side: Secondary | ICD-10-CM | POA: Diagnosis not present

## 2019-11-11 NOTE — Telephone Encounter (Signed)
The following will be faxed to Dr. Parke Simmers:  Dr. Parke Simmers, Annette Munoz will be seeing you on 11/18/19 for a visit. She had order approved for right AFO from Dr. Thurston Hole as she trips on right foot at times when walking in community. Insurance requires a face to face for AFOs. If you could please address this need in your notes at the visit and then send notes and order for right AFO to [facility]. Thanks so much!  Elmer Bales, PT, DPT, NCS      [facility] [address] [place], [place]  [address] Phone:  [phone] Fax:  [phone]

## 2019-11-11 NOTE — Therapy (Signed)
[facility] [address] [place], [name], [address] Phone: [phone]   Fax:  [phone]  Physical Therapy Treatment  Patient Details  Name: Annette Munoz MRN: [id number] Date of Birth: 03-04-1966 Referring Provider (PT): Elsie Saas, MD   Encounter Date: 11/11/2019  PT End of Session - 11/11/19 1618    Visit Number  12    Number of Visits  16    Date for PT Re-Evaluation  12/27/19    Authorization Type  MCR    PT Start Time  1616    PT Stop Time  1658    PT Time Calculation (min)  42 min    Equipment Utilized During Treatment  Gait belt    Activity Tolerance  Patient tolerated treatment well    Behavior During Therapy  [facility] for tasks assessed/performed       Past Medical History:  Diagnosis Date  . Diabetes mellitus without complication ([place])   . Hypertension   . Stroke [facility])     Past Surgical History:  Procedure Laterality Date  . CESAREAN SECTION    . TEE WITHOUT CARDIOVERSION N/A 08/10/2015   Procedure: TRANSESOPHAGEAL ECHOCARDIOGRAM (TEE);  Surgeon: [name], MD;  Location: [facility] ENDOSCOPY;  Service: Cardiovascular;  Laterality: N/A;    There were no vitals filed for this visit.  Subjective Assessment - 11/11/19 1618    Subjective  Pt reports that she is doing well. Pt would like to try aquatics. Daughter reports she will try to bring her in summer to pool as well.    Patient is accompained by:  Family member   dtr: Annette Munoz   Patient Stated Goals  strengthen Rt side and walk better    Currently in Pain?  No/denies                       [facility] Adult PT Treatment/Exercise - 11/11/19 1625      Ambulation/Gait   Ambulation/Gait  Yes    Ambulation/Gait Assistance  5: Supervision    Ambulation/Gait Assistance Details  Pt was cued to try to increase step length and speed.    Ambulation Distance (Feet)  230 Feet    Assistive device  Straight cane    Gait Pattern  Step-through  pattern;Decreased step length - right;Decreased step length - left;Decreased weight shift to right    Ambulation Surface  Level;Indoor    Stairs  Yes    Stairs Assistance  5: Supervision;4: Min guard    Stairs Assistance Details (indicate cue type and reason)  Pt performed with step-to pattern first 4 then with reciprocal pattern with ascent second 4    Stair Management Technique  Alternating pattern;Step to pattern   SPC only   Number of Stairs  8    Gait Comments  Pt performed marching gait during 2nd half. At end of session pt ambulated another 36' with working on large steps without AD CGA with decreased left step length and decreased stability. Pt then ambulated 115' with SPC with PT trying to get patient to continue with longer steps and faster cadence trying to keep up more with therapist. Pt still with slower gait speed.      Neuro Re-ed    Neuro Re-ed Details   In // bars stepping over 4 different obstacles with 1 UE support x 6 laps, side stepping over 4 obstacles with UE support x 6 laps. Standing on rockerboard positioned side to side trying to maintain  level x 30 sec eyes open,  30 sec eyes closed with decreased stability leaning to sides more CGA/min assist, trying to maintain level with trunk rotation reaching across body x 10 each side. Standing on half foam with curved part down first x 30 sec with multiple LOB then with flat part down 30 sec x 2 with only occasional LOB first bout and able to maintain full second bout. Pt was getting more hip strategy kicking in on foam.             PT Education - 11/11/19 1949    Education Details  Pt to continue with current HEP    Person(s) Educated  Patient    Methods  Explanation    Comprehension  Verbalized understanding       PT Short Term Goals - 10/14/19 1701      PT SHORT TERM GOAL #1   Title  Pt/family verblaize/demo HEP for strength, balance, gait.  TARGET 10/01/19    Time  4    Period  Weeks    Status  Achieved       PT SHORT TERM GOAL #2   Title  Pt will imporve TUG from 21 seconds to less than 18 seconds no AD    Baseline  17.78 seconds on 10/07/19    Time  4    Period  Weeks    Status  Achieved      PT SHORT TERM GOAL #3   Title  Pt will improve FGA score to >/=13/30 to decr. falls risk.    Baseline  11/30    Time  4    Period  Weeks    Status  Not Met        PT Long Term Goals - 10/28/19 1623      PT LONG TERM GOAL #1   Title  Pt/family will verbalize understanding of final HEP and continued exercise plan post PT discharge  TARGET for all LTG 11/26/19    Baseline  pt will continue to benefit from upgrades/additions to HEP    Time  4    Period  Weeks    Status  On-going    Target Date  11/26/19      PT LONG TERM GOAL #2   Title  UPDATED GOAL:  Pt will improved gait velocity to at least 1.9 ft/sec LRAD for improved mobility and decr fall risk.    Baseline  1.6 ft/sec with SPC on 10/28/19    Time  4    Period  Weeks    Status  Revised      PT LONG TERM GOAL #3   Title  Improve TUG score to less than or equal to 15 seconds using SPC for decreased fall risk.    Baseline  18.8 seconds on 10/28/19    Time  4    Period  Weeks    Status  Revised      PT LONG TERM GOAL #4   Title  Pt's FGA score will improve to >/=15/30 to decr. falls risk.    Baseline  12/30 with SPC on 10/28/19    Time  4    Period  Weeks    Status  Revised      PT LONG TERM GOAL #5   Title  UPDATED GOAL:  ambulate at least 1000 ft indoor/outdoor surfaces with supervision and LRAD    Baseline  unable to assess today due to time constraints.    Time  4  Period  Weeks    Status  On-going      Additional Long Term Goals   Additional Long Term Goals  Yes      PT LONG TERM GOAL #6   Title  Patient will negotiate 4 steps using SPC with supervision of daughter and no verbal cues for technique for improved safety with stair negotiation into/out of the home.    Time  4    Period  Weeks    Status  New             Plan - 11/11/19 1950    Clinical Impression Statement  Pt was able to show better foot clearance today. Did have new sneakers and she reported they are very comfortable. Pt does need cuing to try to increase step length.    Personal Factors and Comorbidities  Comorbidity 3+    Comorbidities  CVA X2 with Rt hemiparesis, history of DM, HTN, obesity, HLD    Examination-Activity Limitations  Locomotion Level;Transfers;Bend;Carry;Squat;Stairs;Lift;Stand    Examination-Participation Restrictions  Meal Prep;Cleaning;Community Activity;Shop;Laundry    Stability/Clinical Decision Making  Evolving/Moderate complexity    Rehab Potential  Good    PT Frequency  2x / week    PT Duration  4 weeks    PT Treatment/Interventions  ADLs/Self Care Home Management;Aquatic Therapy;Electrical Stimulation;Neuromuscular re-education;Balance training;Therapeutic exercise;Therapeutic activities;Stair training;Gait training;Patient/family education;Orthotic Fit/Training;Manual techniques;Energy conservation;Passive range of motion;Dry needling    PT Next Visit Plan  continue gait training (with dynamic tasks), RLE strengthening, and balance. add to HEP as appropriate. Perhaps wait on aquatic until later in spring? Come back when receives AFO?    PT Home Exercise Plan  [email or web address]    Consulted and Agree with Plan of Care  Patient;Family member/caregiver    Family Member Consulted  daughter       Patient will benefit from skilled therapeutic intervention in order to improve the following deficits and impairments:  Abnormal gait, Decreased activity tolerance, Decreased balance, Decreased coordination, Decreased endurance, Decreased mobility, Decreased strength, Difficulty walking, Pain, Impaired vision/preception  Visit Diagnosis: Muscle weakness (generalized)  Other abnormalities of gait and mobility     Problem List Patient Active Problem List   Diagnosis Date Noted  . Intracranial vascular stenosis  12/05/2016  . Depression 12/05/2016  . Seizure-like activity ([place]) 06/28/2016  . 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 Gait difficulty   . Memory loss 05/22/2016  . Confusion   . Acute ischemic stroke ([place]) 05/18/2016  . Morbid obesity ([place]) 09/30/2015  . Diabetes mellitus ([place])   . Essential hypertension   . Hyperlipidemia   . Cerebrovascular accident (CVA) due to thrombosis of cerebral artery ([place])   . CVA (cerebral infarction) 08/07/2015  . Pure hypercholesterolemia 10/04/2009    Electa Sniff, PT, DPT, NCS 11/11/2019, 7:53 PM  [facility] [address] [place], [name], [address] Phone: [phone]   Fax:  [phone]  Name: Annette Munoz MRN: [id number] Date of Birth: 1966-08-02

## 2019-11-13 ENCOUNTER — Ambulatory Visit: Payer: Medicare HMO

## 2019-11-13 ENCOUNTER — Other Ambulatory Visit: Payer: Self-pay

## 2019-11-13 DIAGNOSIS — M6281 Muscle weakness (generalized): Secondary | ICD-10-CM

## 2019-11-13 DIAGNOSIS — R2689 Other abnormalities of gait and mobility: Secondary | ICD-10-CM

## 2019-11-13 DIAGNOSIS — I69351 Hemiplegia and hemiparesis following cerebral infarction affecting right dominant side: Secondary | ICD-10-CM | POA: Diagnosis not present

## 2019-11-13 NOTE — Therapy (Signed)
[facility] [address] [place], [name], [address] Phone: [phone]   Fax:  [phone]  Physical Therapy Treatment  Patient Details  Name: PATRCIA SCHNEPP MRN: [id number] Date of Birth: [birth year] Referring Provider (PT): Elsie Saas, MD   Encounter Date: 11/13/2019  PT End of Session - 11/13/19 1700    Visit Number  13    Number of Visits  16    Date for PT Re-Evaluation  12/27/19    Authorization Type  MCR    PT Start Time  8768    PT Stop Time  1700    PT Time Calculation (min)  43 min    Equipment Utilized During Treatment  Gait belt    Activity Tolerance  Patient tolerated treatment well    Behavior During Therapy  [facility] for tasks assessed/performed       Past Medical History:  Diagnosis Date  . Diabetes mellitus without complication ([place])   . Hypertension   . Stroke [facility])     Past Surgical History:  Procedure Laterality Date  . CESAREAN SECTION    . TEE WITHOUT CARDIOVERSION N/A 08/10/2015   Procedure: TRANSESOPHAGEAL ECHOCARDIOGRAM (TEE);  Surgeon: [name], MD;  Location: [facility] ENDOSCOPY;  Service: Cardiovascular;  Laterality: N/A;    There were no vitals filed for this visit.  Subjective Assessment - 11/13/19 1624    Subjective  Pt doing well. Went for walk outside yesterday and pt reports she was a little tired as was going uphill some.    Patient is accompained by:  Family member   dtr: [name]   Patient Stated Goals  strengthen Rt side and walk better    Currently in Pain?  No/denies                       [facility] Adult PT Treatment/Exercise - 11/13/19 1625      Ambulation/Gait   Ambulation/Gait  Yes    Ambulation/Gait Assistance  5: Supervision    Ambulation/Gait Assistance Details  Pt was cued to try to increase step length and cadence as well as take hands out of pockets for more arm swing. With the 2nd half PT provided facilitation at pelvis for more trunk  rotation. No AD 2nd half as well    Ambulation Distance (Feet)  230 Feet    Assistive device  Straight cane;None    Gait Pattern  Step-through pattern;Decreased step length - right;Decreased step length - left;Decreased arm swing - right;Decreased arm swing - left    Ambulation Surface  Level;Indoor    Stairs  Yes    Stairs Assistance  5: Supervision    Stairs Assistance Details (indicate cue type and reason)  Verbal cues to lift right hip straight forward and up and try to prevent circumduction. Pt also had 2# ankle weight on right leg.    Stair Management Technique  Alternating pattern;Two rails    Number of Stairs  8    Gait Comments  Pt ambulated on treadmill at 1.12mh  for 5 min with verbal cues to increase step length and heel strike. HR=94 and BP= 132/82 after gait.      Neuro Re-ed    Neuro Re-ed Details   Stepping over 4 hurdles with 1 UE support on bar with verbal cues to not circumduct right hip x 2 laps, then added 2# weight to right ankle and PT providing tactile cues at pelvis to prevent patient from turning body to  clear to try to get her to bring leg straight fowards x 2 laps. Standing on rockerboard positioned ant/post maintaining level eyes open x 30 sec then with eyes closed 30 sec x 2. Then repeated with board positioned side to side of the same. Pt had decreased stability with side to side and eyes closed with LOB to each side a few times. CGA for safety.             PT Education - 11/13/19 1709    Education Details  Pt to continue with current HEP. Discussed plan to discharge next visit. Went over plan to possibly return to clinic later in spring after gets AFO to be reassessed with use and to establish aquatic HEP so can do more in summer on own. Will need new referral for this.    Person(s) Educated  Patient;Child(ren)    Methods  Explanation    Comprehension  Verbalized understanding       PT Short Term Goals - 10/14/19 1701      PT SHORT TERM GOAL #1    Title  Pt/family verblaize/demo HEP for strength, balance, gait.  TARGET 10/01/19    Time  4    Period  Weeks    Status  Achieved      PT SHORT TERM GOAL #2   Title  Pt will imporve TUG from 21 seconds to less than 18 seconds no AD    Baseline  17.78 seconds on 10/07/19    Time  4    Period  Weeks    Status  Achieved      PT SHORT TERM GOAL #3   Title  Pt will improve FGA score to >/=13/30 to decr. falls risk.    Baseline  11/30    Time  4    Period  Weeks    Status  Not Met        PT Long Term Goals - 10/28/19 1623      PT LONG TERM GOAL #1   Title  Pt/family will verbalize understanding of final HEP and continued exercise plan post PT discharge  TARGET for all LTG 11/26/19    Baseline  pt will continue to benefit from upgrades/additions to HEP    Time  4    Period  Weeks    Status  On-going    Target Date  11/26/19      PT LONG TERM GOAL #2   Title  UPDATED GOAL:  Pt will improved gait velocity to at least 1.9 ft/sec LRAD for improved mobility and decr fall risk.    Baseline  1.6 ft/sec with SPC on 10/28/19    Time  4    Period  Weeks    Status  Revised      PT LONG TERM GOAL #3   Title  Improve TUG score to less than or equal to 15 seconds using SPC for decreased fall risk.    Baseline  18.8 seconds on 10/28/19    Time  4    Period  Weeks    Status  Revised      PT LONG TERM GOAL #4   Title  Pt's FGA score will improve to >/=15/30 to decr. falls risk.    Baseline  12/30 with SPC on 10/28/19    Time  4    Period  Weeks    Status  Revised      PT LONG TERM GOAL #5   Title  UPDATED GOAL:  ambulate at least 1000 ft indoor/outdoor surfaces with supervision and LRAD    Baseline  unable to assess today due to time constraints.    Time  4    Period  Weeks    Status  On-going      Additional Long Term Goals   Additional Long Term Goals  Yes      PT LONG TERM GOAL #6   Title  Patient will negotiate 4 steps using SPC with supervision of daughter and no verbal cues  for technique for improved safety with stair negotiation into/out of the home.    Time  4    Period  Weeks    Status  New            Plan - 11/13/19 1710    Clinical Impression Statement  Pt was able to walk at faster pace on treadmill. She did feel more fatigued after this. Vitals remained good.    Personal Factors and Comorbidities  Comorbidity 3+    Comorbidities  CVA X2 with Rt hemiparesis, history of DM, HTN, obesity, HLD    Examination-Activity Limitations  Locomotion Level;Transfers;Bend;Carry;Squat;Stairs;Lift;Stand    Examination-Participation Restrictions  Meal Prep;Cleaning;Community Activity;Shop;Laundry    Stability/Clinical Decision Making  Evolving/Moderate complexity    Rehab Potential  Good    PT Frequency  2x / week    PT Duration  4 weeks    PT Treatment/Interventions  ADLs/Self Care Home Management;Aquatic Therapy;Electrical Stimulation;Neuromuscular re-education;Balance training;Therapeutic exercise;Therapeutic activities;Stair training;Gait training;Patient/family education;Orthotic Fit/Training;Manual techniques;Energy conservation;Passive range of motion;Dry needling    PT Next Visit Plan  Discharge next visit. Plan is to have her get new referral later in spring after gets AFO and come back to be reassessed for use and establish aquatic program.    [place] and Agree with Plan of Care  Patient;Family member/caregiver    Family Member Consulted  daughter       Patient will benefit from skilled therapeutic intervention in order to improve the following deficits and impairments:  Abnormal gait, Decreased activity tolerance, Decreased balance, Decreased coordination, Decreased endurance, Decreased mobility, Decreased strength, Difficulty walking, Pain, Impaired vision/preception  Visit Diagnosis: Muscle weakness (generalized)  Other abnormalities of gait and mobility     Problem List Patient Active Problem List    Diagnosis Date Noted  . Intracranial vascular stenosis 12/05/2016  . Depression 12/05/2016  . Seizure-like activity ([place]) 06/28/2016  . 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 Gait difficulty   . Memory loss 05/22/2016  . Confusion   . Acute ischemic stroke ([place]) 05/18/2016  . Morbid obesity ([place]) 09/30/2015  . Diabetes mellitus ([place])   . Essential hypertension   . Hyperlipidemia   . Cerebrovascular accident (CVA) due to thrombosis of cerebral artery ([place])   . CVA (cerebral infarction) 08/07/2015  . Pure hypercholesterolemia 10/04/2009    Electa Sniff, PT, DPT, NCS 11/13/2019, 5:13 PM  [facility] [address] [place], [name], [address] Phone: [phone]   Fax:  [phone]  Name: JISELLE SHEU MRN: [id number] Date of Birth: 28-Aug-1966

## 2019-11-18 DIAGNOSIS — I10 Essential (primary) hypertension: Secondary | ICD-10-CM | POA: Diagnosis not present

## 2019-11-18 DIAGNOSIS — E1169 Type 2 diabetes mellitus with other specified complication: Secondary | ICD-10-CM | POA: Diagnosis not present

## 2019-11-19 ENCOUNTER — Other Ambulatory Visit: Payer: Self-pay

## 2019-11-19 ENCOUNTER — Ambulatory Visit: Payer: Medicare HMO | Attending: Orthopedic Surgery | Admitting: Physical Therapy

## 2019-11-19 DIAGNOSIS — R2689 Other abnormalities of gait and mobility: Secondary | ICD-10-CM | POA: Diagnosis not present

## 2019-11-19 DIAGNOSIS — M6281 Muscle weakness (generalized): Secondary | ICD-10-CM

## 2019-11-19 DIAGNOSIS — I69351 Hemiplegia and hemiparesis following cerebral infarction affecting right dominant side: Secondary | ICD-10-CM | POA: Insufficient documentation

## 2019-11-19 NOTE — Therapy (Signed)
[facility] [address] [place], [name], [address] Phone: [phone]   Fax:  [phone]  Physical Therapy Treatment/Discharge Summary  Patient Details  Name: Annette Munoz MRN: [id number] Date of Birth: 1965-12-19 Referring Provider (PT): Elsie Saas, MD   Encounter Date: 11/19/2019  PT End of Session - 11/19/19 1654    Visit Number  14    Number of Visits  16    Date for PT Re-Evaluation  12/27/19    Authorization Type  MCR    PT Start Time  1615    PT Stop Time  1654    PT Time Calculation (min)  39 min    Equipment Utilized During Treatment  Gait belt    Activity Tolerance  Patient tolerated treatment well    Behavior During Therapy  [facility] for tasks assessed/performed       Past Medical History:  Diagnosis Date  . Diabetes mellitus without complication ([place])   . Hypertension   . Stroke [facility])     Past Surgical History:  Procedure Laterality Date  . CESAREAN SECTION    . TEE WITHOUT CARDIOVERSION N/A 08/10/2015   Procedure: TRANSESOPHAGEAL ECHOCARDIOGRAM (TEE);  Surgeon: [name], MD;  Location: [facility] ENDOSCOPY;  Service: Cardiovascular;  Laterality: N/A;    There were no vitals filed for this visit.  Subjective Assessment - 11/19/19 1618    Subjective  Saw the doctor yesterday. Waiting for them to fax the AFO order to [facility]. MD said everything was good.    Patient is accompained by:  Family member   dtr: [name]   Patient Stated Goals  strengthen Rt side and walk better    Currently in Pain?  No/denies         [facility] PT Assessment - 11/19/19 1635      Timed Up and Go Test   Normal TUG (seconds)  15.47   with SPC     Functional Gait  Assessment   Gait assessed   Yes    Gait Level Surface  Walks 20 ft, slow speed, abnormal gait pattern, evidence for imbalance or deviates 10-15 in outside of the 12 in walkway width. Requires more than 7 sec to ambulate 20 ft.    Change in Gait Speed   Makes only minor adjustments to walking speed, or accomplishes a change in speed with significant gait deviations, deviates 10-15 in outside the 12 in walkway width, or changes speed but loses balance but is able to recover and continue walking.    Gait with Horizontal Head Turns  Performs head turns smoothly with slight change in gait velocity (eg, minor disruption to smooth gait path), deviates 6-10 in outside 12 in walkway width, or uses an assistive device.    Gait with Vertical Head Turns  Performs task with slight change in gait velocity (eg, minor disruption to smooth gait path), deviates 6 - 10 in outside 12 in walkway width or uses assistive device    Gait and Pivot Turn  Pivot turns safely within 3 sec and stops quickly with no loss of balance.    Step Over Obstacle  Is able to step over one shoe box (4.5 in total height) but must slow down and adjust steps to clear box safely. May require verbal cueing.    Gait with Narrow Base of Support  Ambulates less than 4 steps heel to toe or cannot perform without assistance.    Gait with Eyes Closed  Walks 20 ft,  slow speed, abnormal gait pattern, evidence for imbalance, deviates 10-15 in outside 12 in walkway width. Requires more than 9 sec to ambulate 20 ft.    Ambulating Backwards  Walks 20 ft, uses assistive device, slower speed, mild gait deviations, deviates 6-10 in outside 12 in walkway width.    Steps  Two feet to a stair, must use rail.    Total Score  14    FGA comment:  14/30                    [facility] Adult PT Treatment/Exercise - 11/19/19 1635      Ambulation/Gait   Ambulation/Gait  Yes    Ambulation/Gait Assistance  5: Supervision    Ambulation Distance (Feet)  300 Feet    Assistive device  Straight cane    Gait Pattern  Step-through pattern;Decreased step length - right;Decreased step length - left;Decreased arm swing - right;Decreased arm swing - left    Ambulation Surface  Level;Indoor    Gait velocity  1.75 ft/sec     with SPC   Stairs  Yes    Stairs Assistance  5: Supervision    Stairs Assistance Details (indicate cue type and reason)  pt demonstrated how she performs stairs at home with [facility] - needed cues for 2nd rep for step to pattern and leading with weakeer LE    Stair Management Technique  Step to pattern;With cane;Alternating pattern    Number of Stairs  8    Height of Stairs  6          Access Code: [email or web address]  URL: [email or web address]  Date: 11/19/2019  Prepared by: Janann August   Verbally reviewed and had patient demo finalized HEP - pt and pt's daughter verbalized understanding.   Exercises Backwards Walking - 4 reps - 1 sets - 1x daily - 5x weekly Side Stepping with Counter Support - 4 reps - 1 sets - 1x daily - 5x weekly Standing Tandem Balance with Counter Support - 3 reps - 1 sets - 1x daily - 5x weekly Supine Bridge - 10 reps - 2 sets - 2x daily - 7x weekly Clamshell with Resistance - 10 reps - 2 sets - 2x daily - 7x weekly Sit to Stand without Arm Support - 10 reps - 2 sets - 1x daily - 7x weekly Heel Toe Raises with Counter Support - 10 reps - 2 sets - 2x daily - 7x weekly Standing March with Counter Support - 10 reps - 2 sets - 2x daily - 7x weekly Standing on Foam Pad - 3 sets - 30 hold - 1x daily - 7x weekly      PT Short Term Goals - 10/14/19 1701      PT SHORT TERM GOAL #1   Title  Pt/family verblaize/demo HEP for strength, balance, gait.  TARGET 10/01/19    Time  4    Period  Weeks    Status  Achieved      PT SHORT TERM GOAL #2   Title  Pt will imporve TUG from 21 seconds to less than 18 seconds no AD    Baseline  17.78 seconds on 10/07/19    Time  4    Period  Weeks    Status  Achieved      PT SHORT TERM GOAL #3   Title  Pt will improve FGA score to >/=13/30 to decr. falls risk.    Baseline  11/30    Time  4  Period  Weeks    Status  Not Met        PT Long Term Goals - 11/19/19 1620      PT LONG TERM GOAL #1   Title   Pt/family will verbalize understanding of final HEP and continued exercise plan post PT discharge  TARGET for all LTG 11/26/19    Baseline  met with current HEP    Time  4    Period  Weeks    Status  Achieved      PT LONG TERM GOAL #2   Title  UPDATED GOAL:  Pt will improved gait velocity to at least 1.9 ft/sec LRAD for improved mobility and decr fall risk.    Baseline  1.6 ft/sec with SPC on 10/28/19, 1.75 ft/sec on 11/19/19    Time  4    Period  Weeks    Status  Not Met      PT LONG TERM GOAL #3   Title  Improve TUG score to less than or equal to 15 seconds using SPC for decreased fall risk.    Baseline  18.8 seconds on 10/28/19, 15.47 seconds on 11/19/19    Time  4    Period  Weeks    Status  Not Met      PT LONG TERM GOAL #4   Title  Pt's FGA score will improve to >/=15/30 to decr. falls risk.    Baseline  14/30 with SPC on 11/19/19    Time  4    Period  Weeks    Status  Not Met      PT LONG TERM GOAL #5   Title  UPDATED GOAL:  ambulate at least 1000 ft indoor/outdoor surfaces with supervision and LRAD    Baseline  unable to assess today due to time constraints.    Time  4    Period  Weeks    Status  Deferred      PT LONG TERM GOAL #6   Title  Patient will negotiate 4 steps using SPC with supervision of daughter and no verbal cues for technique for improved safety with stair negotiation into/out of the home.    Baseline  needed verbal cues for technique - to perform step to descending with RLE first.    Time  4    Period  Weeks    Status  Partially Met           PHYSICAL THERAPY DISCHARGE SUMMARY  Visits from Start of Care: 14  Current functional level related to goals / functional outcomes: See LTGs.   Remaining deficits: decr gait speed, impaired dynamic balance, decr functional LE strength, cues needed for stair technique   Education / Equipment: HEP   Plan: Patient agrees to discharge.  Patient goals were partially met. Patient is being discharged due to  being pleased with the current functional level.  ?????       Plan - 11/20/19 0837    Clinical Impression Statement  Focus of today's skilled session was assessing pt's LTGs for anticipated D/C. Pt has partially met/not met LTGs. Pt did not meet goal target for TUG, FGA, and gait speed - however did have improvements since goals were last checked. Pt's TUG score decreased from 18.8 seconds with SPC to 15.47 seconds with SPC. Pt improved her FGA score to a 14/30 (previously 12/30). Pt improved her gait speed with SPC to 1.75 ft/sec (previously 1.6 ft/sec) - however this speed still puts pt at a  higher risk for falls. Reviewed final HEP with pt and pt's daughter, with pt's daughter stating her mom has been  performing at home with her supervision. Discussed to get a new referral in the next couple of months after pt receives her AFO and to establish an aquatic HEP, both pt and pt's daughter verbalizing understanding. Pt is pleased with her progress in PT and will be discharged at this time.    Personal Factors and Comorbidities  Comorbidity 3+    Comorbidities  CVA X2 with Rt hemiparesis, history of DM, HTN, obesity, HLD    Examination-Activity Limitations  Locomotion Level;Transfers;Bend;Carry;Squat;Stairs;Lift;Stand    Examination-Participation Restrictions  Meal Prep;Cleaning;Community Activity;Shop;Laundry    Stability/Clinical Decision Making  Evolving/Moderate complexity    Rehab Potential  Good    PT Frequency  2x / week    PT Duration  4 weeks    PT Treatment/Interventions  ADLs/Self Care Home Management;Aquatic Therapy;Electrical Stimulation;Neuromuscular re-education;Balance training;Therapeutic exercise;Therapeutic activities;Stair training;Gait training;Patient/family education;Orthotic Fit/Training;Manual techniques;Energy conservation;Passive range of motion;Dry needling    PT Next Visit Plan  D/C    PT Home Exercise Plan  [email or web address]    Consulted and Agree with Plan of Care   Patient;Family member/caregiver    Family Member Consulted  daughter       Patient will benefit from skilled therapeutic intervention in order to improve the following deficits and impairments:  Abnormal gait, Decreased activity tolerance, Decreased balance, Decreased coordination, Decreased endurance, Decreased mobility, Decreased strength, Difficulty walking, Pain, Impaired vision/preception  Visit Diagnosis: Muscle weakness (generalized)  Other abnormalities of gait and mobility  Hemiplegia and hemiparesis following cerebral infarction affecting right dominant side [facility])     Problem List Patient Active Problem List   Diagnosis Date Noted  . Intracranial vascular stenosis 12/05/2016  . Depression 12/05/2016  . Seizure-like activity ([place]) 06/28/2016  . 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 Gait difficulty   . Memory loss 05/22/2016  . Confusion   . Acute ischemic stroke ([place]) 05/18/2016  . Morbid obesity ([place]) 09/30/2015  . Diabetes mellitus ([place])   . Essential hypertension   . Hyperlipidemia   . Cerebrovascular accident (CVA) due to thrombosis of cerebral artery ([place])   . CVA (cerebral infarction) 08/07/2015  . Pure hypercholesterolemia 10/04/2009    Lillia Pauls, DPT  11/20/2019, 8:38 AM  [facility] [address] [place], [name], [address] Phone: [phone]   Fax:  [phone]  Name: Annette Munoz MRN: [id number] Date of Birth: [birth year]

## 2019-11-19 NOTE — Patient Instructions (Signed)
Access Code: [email or web address]  URL: [email or web address]  Date: 11/19/2019  Prepared by: Sherlie Ban   Exercises Backwards Walking - 4 reps - 1 sets - 1x daily - 5x weekly Side Stepping with Counter Support - 4 reps - 1 sets - 1x daily - 5x weekly Standing Tandem Balance with Counter Support - 3 reps - 1 sets - 1x daily - 5x weekly Supine Bridge - 10 reps - 2 sets - 2x daily - 7x weekly Clamshell with Resistance - 10 reps - 2 sets - 2x daily - 7x weekly Sit to Stand without Arm Support - 10 reps - 2 sets - 1x daily - 7x weekly Heel Toe Raises with Counter Support - 10 reps - 2 sets - 2x daily - 7x weekly Standing March with Counter Support - 10 reps - 2 sets - 2x daily - 7x weekly Standing on Foam Pad - 3 sets - 30 hold - 1x daily - 7x weekly

## 2019-11-25 ENCOUNTER — Ambulatory Visit: Payer: Medicare HMO | Admitting: Physical Therapy

## 2019-12-17 ENCOUNTER — Encounter (HOSPITAL_COMMUNITY): Payer: Self-pay

## 2019-12-17 ENCOUNTER — Emergency Department (HOSPITAL_COMMUNITY)
Admission: EM | Admit: 2019-12-17 | Discharge: 2019-12-17 | Disposition: A | Discharge status: Home / Self Care | Payer: Medicare HMO | Attending: Emergency Medicine | Admitting: Emergency Medicine

## 2019-12-17 ENCOUNTER — Other Ambulatory Visit: Payer: Self-pay

## 2019-12-17 DIAGNOSIS — N939 Abnormal uterine and vaginal bleeding, unspecified: Secondary | ICD-10-CM | POA: Diagnosis not present

## 2019-12-17 DIAGNOSIS — Z5321 Procedure and treatment not carried out due to patient leaving prior to being seen by health care provider: Secondary | ICD-10-CM | POA: Diagnosis not present

## 2019-12-17 DIAGNOSIS — R2681 Unsteadiness on feet: Secondary | ICD-10-CM | POA: Diagnosis not present

## 2019-12-17 DIAGNOSIS — R1110 Vomiting, unspecified: Secondary | ICD-10-CM | POA: Insufficient documentation

## 2019-12-17 LAB — CBC
HCT: 30 % — ABNORMAL LOW (ref 36.0–46.0)
Hemoglobin: 9.3 g/dL — ABNORMAL LOW (ref 12.0–15.0)
MCH: 29.7 pg (ref 26.0–34.0)
MCHC: 31 g/dL (ref 30.0–36.0)
MCV: 95.8 fL (ref 80.0–100.0)
Platelets: 434 10*3/uL — ABNORMAL HIGH (ref 150–400)
RBC: 3.13 MIL/uL — ABNORMAL LOW (ref 3.87–5.11)
RDW: 14.4 % (ref 11.5–15.5)
WBC: 8.3 10*3/uL (ref 4.0–10.5)
nRBC: 0 % (ref 0.0–0.2)

## 2019-12-17 LAB — URINALYSIS, ROUTINE W REFLEX MICROSCOPIC

## 2019-12-17 LAB — CBG MONITORING, ED: Glucose-Capillary: 85 mg/dL (ref 70–99)

## 2019-12-17 LAB — URINALYSIS, MICROSCOPIC (REFLEX): RBC / HPF: >50 RBC/hpf (ref 0–5)

## 2019-12-17 LAB — BASIC METABOLIC PANEL
Anion gap: 11 (ref 5–15)
BUN: 22 mg/dL — ABNORMAL HIGH (ref 6–20)
CO2: 20 mmol/L — ABNORMAL LOW (ref 22–32)
Calcium: 8.5 mg/dL — ABNORMAL LOW (ref 8.9–10.3)
Chloride: 106 mmol/L (ref 98–111)
Creatinine, Ser: 1.57 mg/dL — ABNORMAL HIGH (ref 0.44–1.00)
GFR calc Af Amer: 43 mL/min — ABNORMAL LOW (ref >60)
GFR calc non Af Amer: 37 mL/min — ABNORMAL LOW (ref >60)
Glucose, Bld: 100 mg/dL — ABNORMAL HIGH (ref 70–99)
Potassium: 3.5 mmol/L (ref 3.5–5.1)
Sodium: 137 mmol/L (ref 135–145)

## 2019-12-17 NOTE — ED Triage Notes (Signed)
Pt also reports she has been on her menstrual cycle for 2 weeks.

## 2019-12-17 NOTE — ED Triage Notes (Signed)
Pt accompanied by daughter who reports an episode of feeling "off balance' pt vomited once and then felt fine. Pt has no complaints at this time.

## 2019-12-18 DIAGNOSIS — N921 Excessive and frequent menstruation with irregular cycle: Secondary | ICD-10-CM | POA: Diagnosis not present

## 2019-12-18 DIAGNOSIS — N946 Dysmenorrhea, unspecified: Secondary | ICD-10-CM | POA: Diagnosis not present

## 2020-02-09 ENCOUNTER — Encounter: Payer: Self-pay | Admitting: Obstetrics and Gynecology

## 2020-02-09 ENCOUNTER — Other Ambulatory Visit (HOSPITAL_COMMUNITY)
Admission: RE | Admit: 2020-02-09 | Discharge: 2020-02-09 | Disposition: A | Discharge status: Home / Self Care | Payer: Medicare HMO | Source: Ambulatory Visit | Attending: Obstetrics and Gynecology | Admitting: Obstetrics and Gynecology

## 2020-02-09 ENCOUNTER — Ambulatory Visit: Payer: Medicare HMO | Admitting: Obstetrics and Gynecology

## 2020-02-09 ENCOUNTER — Other Ambulatory Visit: Payer: Self-pay

## 2020-02-09 VITALS — BP 127/73 | HR 60 | Ht 63.0 in | Wt 225.0 lb

## 2020-02-09 DIAGNOSIS — Z1151 Encounter for screening for human papillomavirus (HPV): Secondary | ICD-10-CM | POA: Insufficient documentation

## 2020-02-09 DIAGNOSIS — R87610 Atypical squamous cells of undetermined significance on cytologic smear of cervix (ASC-US): Secondary | ICD-10-CM | POA: Insufficient documentation

## 2020-02-09 DIAGNOSIS — Z124 Encounter for screening for malignant neoplasm of cervix: Secondary | ICD-10-CM | POA: Diagnosis not present

## 2020-02-09 DIAGNOSIS — N939 Abnormal uterine and vaginal bleeding, unspecified: Secondary | ICD-10-CM | POA: Insufficient documentation

## 2020-02-09 DIAGNOSIS — Z1231 Encounter for screening mammogram for malignant neoplasm of breast: Secondary | ICD-10-CM | POA: Diagnosis not present

## 2020-02-09 MED ORDER — MEGESTROL ACETATE 40 MG PO TABS: 40.0000 mg | ORAL_TABLET | Freq: Every day | ORAL | 5 refills | Status: DC

## 2020-02-09 NOTE — Progress Notes (Signed)
New GYN presents for heavy irregular bleeding, sometime she have to use a Depends or overnight pads which she changes every hour.

## 2020-02-09 NOTE — Progress Notes (Signed)
54 yo P2 with BMI 39 presenting today for evaluation of AUB. Patient is accompanied by her daughter. 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 Patient reports a monthly period lasting 7 days. She states that her periods sometimes skip a month. Patient had an episode of heavy flow lasting over 14 days a few months ago. She was seen in ED and informed of severe anemia. She did not receive a blood transfusion and was treated with supplemental iron. Patient reports some occasional fatigue. She has been on blood thinners since 2016. Patient is without any other complaints. She is overdue for pap smear and screening mammogram  Past Medical History:  Diagnosis Date  . Anemia   . Arthritis   . Diabetes mellitus without complication (HCC)   . Hypertension   . Stroke [facility])    Past Surgical History:  Procedure Laterality Date  . CESAREAN SECTION    . TEE WITHOUT CARDIOVERSION N/A 08/10/2015   Procedure: TRANSESOPHAGEAL ECHOCARDIOGRAM (TEE);  Surgeon: Chrystie Nose, MD;  Location: [facility] ENDOSCOPY;  Service: Cardiovascular;  Laterality: N/A;   Family History  Problem Relation Age of Onset  . Diabetes Mother   . Heart disease Mother   . Hypertension Mother   . Hypertension Father   . Stroke Father   . Stroke Maternal Grandmother    Social History   Tobacco Use  . Smoking status: Never Smoker  . Smokeless tobacco: Never Used  Substance Use Topics  . Alcohol use: No    Alcohol/week: 1.0 standard drinks    Types: 1 Shots of liquor per week    Comment: occasionally  . Drug use: No   ROS See pertinent in HPI. All other systems reviewed and negative  Blood pressure 127/73, pulse 60, height 5\' 3"  (1.6 m), weight 225 lb (102.1 kg), last menstrual period 12/19/2019.  GENERAL: Well-developed, well-nourished female in no acute distress.  BREASTS: Symmetric in size. No palpable masses or lymphadenopathy, skin changes, or nipple drainage. ABDOMEN: Soft, nontender, nondistended. No  organomegaly. PELVIC: Normal external female genitalia. Vagina is pink and rugated.  Normal discharge. Normal appearing cervix. Bimanual exam limited secondary to body habitus EXTREMITIES: No cyanosis, clubbing, or edema, 2+ distal pulses.  A/P 54 yo with AUB - CBC collected  - Pap smear collected - Screening mammogram ordered - Pelvic ultrasound ordered - Discussed medical management with megace - Discussed endometrial biopsy ENDOMETRIAL BIOPSY     The indications for endometrial biopsy were reviewed.   Risks of the biopsy including cramping, bleeding, infection, uterine perforation, inadequate specimen and need for additional procedures  were discussed. The patient states she understands and agrees to undergo procedure today. Consent was signed. Time out was performed. Urine HCG was negative. A sterile speculum was placed in the patient's vagina and the cervix was prepped with Betadine. A single-toothed tenaculum was placed on the anterior lip of the cervix to stabilize it. The uterine cavity was sounded to a depth of 11 cm using the uterine sound. The 3 mm pipelle was introduced into the endometrial cavity without difficulty, 2 passes were made.  A  moderate amount of tissue was  sent to pathology. The instruments were removed from the patient's vagina. Minimal bleeding from the cervix was noted. The patient tolerated the procedure well.  Routine post-procedure instructions were given to the patient. The patient will follow up in two weeks to review the results and for further management.

## 2020-02-09 NOTE — Patient Instructions (Signed)
Preventing Iron Deficiency Anemia, Adult  Iron deficiency is having a lack of iron in the body. Iron is an important mineral that your body needs to build healthy red blood cells. Iron deficiency anemia is a condition in which the concentration of red blood cells or hemoglobin in the blood is below normal because of too little iron. Hemoglobin is a substance in red blood cells that carries oxygen to the body's tissues. You may develop iron deficiency anemia due to:  Blood loss from an injury or condition such as Crohn's disease.  Your body being unable to properly absorb iron and use it to create red blood cells.  Lack of iron in your diet. You can prevent iron deficiency anemia by making certain changes to your diet and lifestyle. What nutrition changes can be made?  Eat foods that are high in iron, such as: ? Red meat, especially liver and beef. ? Poultry. ? Seafood. ? Dried fruit. ? Prune juice. ? Nuts. ? Pumpkin seeds. ? Beans. ? Leafy green vegetables. ? Molasses. ? Tofu.  Look for foods that have added iron (are fortified). Many cereals and breads are iron fortified.  Eat foods that contain vitamin C along with iron-rich foods, preferably in the same meal. Vitamin C increases your body's ability to absorb iron. Foods high in vitamin C include: ? Citrus fruits, such as lemons, oranges, and grapefruits. ? Berries. ? Bell peppers. ? Tomatoes. ? Broccoli.  Do not follow a diet that is very low in fat or very high in fiber.  Do not drink very large amounts of milk, tea, or coffee. What actions can I take to lower my risk? It is important to know whether you are at risk for iron deficiency anemia. Ask your health care provider if you need a blood test to measure your iron or red blood cells. You may have a higher risk for iron deficiency anemia if:  You are a woman and one of the following applies: ? You have heavy menstrual periods. ? You are pregnant. ? You are  breastfeeding.  You have had bypass surgery for weight loss.  You have a digestive disorder such as Crohn's disease, irritable bowel syndrome, or celiac disease.  You regularly take antacids or acid-lowering medicines.  You follow a vegetarian or vegan diet. To lower your risk for iron deficiency:  If you are a vegetarian or vegan, talk with your health care provider or a dietitian about: ? Taking an iron supplement. ? Adding more iron-rich foods to your diet.  Limit your use of antacids or acid-lowering medicines.  If you have heavy menstrual periods, are pregnant, or are breastfeeding, ask your health care provider about taking an iron supplement.  Work with your health care provider to manage conditions that can cause iron deficiency.  Take over-the-counter and prescription medicines only as told by your health care provider.  Keep all follow-up visits as told by your health care provider. This is important. Why are these changes important? It is important to make these changes so that you do not develop iron deficiency anemia. Iron-rich foods help your body produce more red blood cells and have more energy. What can happen if changes are not made? If you do not make these changes, you could develop iron deficiency anemia. If not treated, iron deficiency anemia can lead to serious health complications, including:  Long-term (chronic) fatigue.  Shortness of breath.  Abnormal heart rhythms.  Heart failure.  Uncomfortable sensations and an overwhelming urge   to move your legs (restless legs syndrome).  Weakened disease-fighting system (immune system). Where to find more information Learn more about preventing iron deficiency from:  National Heart, Lung, and Blood Institute: www.nhlbi.nih.gov  American Society of Hematology: www.hematology.org Contact a health care provider if you:  Develop symptoms of iron deficiency, including: ? Fatigue. ? Headache. ? Pale skin,  lips, and nail beds. ? Poor appetite. ? Weakness. Summary  Iron deficiency anemia is a condition in which the concentration of red blood cells or hemoglobin in the blood is below normal because of too little iron.  You can help prevent iron deficiency anemia by eating more iron-rich foods, such as red meat, poultry, or tofu. Look for foods that have added iron (are fortified), such as cereals.  Eat foods that contain vitamin C along with iron-rich foods, preferably in the same meal. Vitamin C increases the body's ability to absorb iron.  Ask your health care provider about your risk for iron deficiency anemia and whether a multivitamin or supplement may be right for you. This information is not intended to replace advice given to you by your health care provider. Make sure you discuss any questions you have with your health care provider. Document Revised: 01/24/2019 Document Reviewed: 08/02/2017 Elsevier Patient Education  2020 Elsevier Inc.  

## 2020-02-10 LAB — CBC
Hematocrit: 31.1 % — ABNORMAL LOW (ref 34.0–46.6)
Hemoglobin: 10 g/dL — ABNORMAL LOW (ref 11.1–15.9)
MCH: 29.1 pg (ref 26.6–33.0)
MCHC: 32.2 g/dL (ref 31.5–35.7)
MCV: 90 fL (ref 79–97)
Platelets: 438 10*3/uL (ref 150–450)
RBC: 3.44 x10E6/uL — ABNORMAL LOW (ref 3.77–5.28)
RDW: 13.7 % (ref 11.7–15.4)
WBC: 9.1 10*3/uL (ref 3.4–10.8)

## 2020-02-11 LAB — SURGICAL PATHOLOGY

## 2020-02-12 LAB — CYTOLOGY - PAP
Comment: NEGATIVE
Diagnosis: UNDETERMINED — AB
High risk HPV: NEGATIVE

## 2020-02-18 DIAGNOSIS — I6930 Unspecified sequelae of cerebral infarction: Secondary | ICD-10-CM | POA: Diagnosis not present

## 2020-02-19 ENCOUNTER — Ambulatory Visit (HOSPITAL_COMMUNITY)
Admission: RE | Admit: 2020-02-19 | Discharge: 2020-02-19 | Disposition: A | Discharge status: Home / Self Care | Payer: Medicare HMO | Source: Ambulatory Visit | Attending: Obstetrics and Gynecology | Admitting: Obstetrics and Gynecology

## 2020-02-19 ENCOUNTER — Other Ambulatory Visit: Payer: Self-pay | Admitting: Obstetrics and Gynecology

## 2020-02-19 ENCOUNTER — Other Ambulatory Visit: Payer: Self-pay

## 2020-02-19 DIAGNOSIS — N939 Abnormal uterine and vaginal bleeding, unspecified: Secondary | ICD-10-CM

## 2020-02-19 DIAGNOSIS — N852 Hypertrophy of uterus: Secondary | ICD-10-CM | POA: Diagnosis not present

## 2020-03-03 ENCOUNTER — Other Ambulatory Visit: Payer: Self-pay | Admitting: Obstetrics and Gynecology

## 2020-03-03 ENCOUNTER — Ambulatory Visit
Admission: RE | Admit: 2020-03-03 | Discharge: 2020-03-03 | Disposition: A | Discharge status: Home / Self Care | Payer: Medicare HMO | Source: Ambulatory Visit | Attending: Obstetrics and Gynecology | Admitting: Obstetrics and Gynecology

## 2020-03-03 ENCOUNTER — Other Ambulatory Visit: Payer: Self-pay

## 2020-03-03 DIAGNOSIS — Z1231 Encounter for screening mammogram for malignant neoplasm of breast: Secondary | ICD-10-CM

## 2020-03-15 DIAGNOSIS — E1122 Type 2 diabetes mellitus with diabetic chronic kidney disease: Secondary | ICD-10-CM | POA: Diagnosis not present

## 2020-03-15 DIAGNOSIS — I129 Hypertensive chronic kidney disease with stage 1 through stage 4 chronic kidney disease, or unspecified chronic kidney disease: Secondary | ICD-10-CM | POA: Diagnosis not present

## 2020-03-15 DIAGNOSIS — N189 Chronic kidney disease, unspecified: Secondary | ICD-10-CM | POA: Diagnosis not present

## 2020-03-15 DIAGNOSIS — Z8673 Personal history of transient ischemic attack (TIA), and cerebral infarction without residual deficits: Secondary | ICD-10-CM | POA: Diagnosis not present

## 2020-04-14 DIAGNOSIS — Z8673 Personal history of transient ischemic attack (TIA), and cerebral infarction without residual deficits: Secondary | ICD-10-CM | POA: Diagnosis not present

## 2020-04-14 DIAGNOSIS — I129 Hypertensive chronic kidney disease with stage 1 through stage 4 chronic kidney disease, or unspecified chronic kidney disease: Secondary | ICD-10-CM | POA: Diagnosis not present

## 2020-04-14 DIAGNOSIS — E1122 Type 2 diabetes mellitus with diabetic chronic kidney disease: Secondary | ICD-10-CM | POA: Diagnosis not present

## 2020-04-14 DIAGNOSIS — N189 Chronic kidney disease, unspecified: Secondary | ICD-10-CM | POA: Diagnosis not present

## 2020-04-22 DIAGNOSIS — I6930 Unspecified sequelae of cerebral infarction: Secondary | ICD-10-CM | POA: Diagnosis not present

## 2020-05-14 DIAGNOSIS — N1832 Chronic kidney disease, stage 3b: Secondary | ICD-10-CM | POA: Diagnosis not present

## 2020-05-14 DIAGNOSIS — E7849 Other hyperlipidemia: Secondary | ICD-10-CM | POA: Diagnosis not present

## 2020-05-14 DIAGNOSIS — I129 Hypertensive chronic kidney disease with stage 1 through stage 4 chronic kidney disease, or unspecified chronic kidney disease: Secondary | ICD-10-CM | POA: Diagnosis not present

## 2020-05-14 DIAGNOSIS — E1122 Type 2 diabetes mellitus with diabetic chronic kidney disease: Secondary | ICD-10-CM | POA: Diagnosis not present

## 2020-05-25 ENCOUNTER — Ambulatory Visit (INDEPENDENT_AMBULATORY_CARE_PROVIDER_SITE_OTHER): Payer: Medicare HMO

## 2020-05-25 ENCOUNTER — Ambulatory Visit: Payer: Medicare HMO | Admitting: Podiatry

## 2020-05-25 ENCOUNTER — Encounter: Payer: Self-pay | Admitting: Podiatry

## 2020-05-25 ENCOUNTER — Other Ambulatory Visit: Payer: Self-pay

## 2020-05-25 DIAGNOSIS — M722 Plantar fascial fibromatosis: Secondary | ICD-10-CM

## 2020-05-25 NOTE — Patient Instructions (Signed)

## 2020-05-26 ENCOUNTER — Telehealth: Payer: Self-pay

## 2020-05-26 NOTE — Progress Notes (Signed)
Subjective:  Patient ID: Annette Munoz, female    DOB: 10-22-1965,  MRN: [id number] HPI Chief Complaint  Patient presents with  . Foot Pain    Plantar heel right - aching x few months, AM pain, tried Tylenol-helps  . New Patient (Initial Visit)    54 y.o. female presents with the above complaint.   ROS: Denies fever chills nausea vomiting muscle aches pains calf pain back pain chest pain shortness of breath.  Past Medical History:  Diagnosis Date  . Anemia   . Arthritis   . Diabetes mellitus without complication ([place])   . Hypertension   . Stroke [facility])    Past Surgical History:  Procedure Laterality Date  . CESAREAN SECTION    . TEE WITHOUT CARDIOVERSION N/A 08/10/2015   Procedure: TRANSESOPHAGEAL ECHOCARDIOGRAM (TEE);  Surgeon: [name], MD;  Location: [facility] ENDOSCOPY;  Service: Cardiovascular;  Laterality: N/A;    Current Outpatient Medications:  .  Alcohol Swabs (ALCOHOL PREP) PADS, Use to check blood sugar daily. E11.65, Disp: 100 each, Rfl: 3 .  amLODipine (NORVASC) 10 MG tablet, Take 1 tablet (10 mg total) by mouth daily. (Patient taking differently: Take 10 mg by mouth daily. ), Disp: 30 tablet, Rfl: 5 .  aspirin 325 MG tablet, Take 1 tablet (325 mg total) by mouth daily. (Patient not taking: Reported on 09/02/2019), Disp: 90 tablet, Rfl: 3 .  atorvastatin (LIPITOR) 80 MG tablet, TAKE 1 TABLET (80 MG TOTAL) BY MOUTH DAILY AT 6 PM. (Patient not taking: Reported on 02/09/2020), Disp: 90 tablet, Rfl: 1 .  blood glucose meter kit and supplies KIT, Use daily to check blood sugar. E11.65, Disp: 1 each, Rfl: 0 .  Blood Pressure Monitoring (PREMIUM AUTOMATIC BP MONITOR) DEVI, 1 Device by Does not apply route daily., Disp: 1 Device, Rfl: 0 .  citalopram (CELEXA) 20 MG tablet, , Disp: , Rfl:  .  clopidogrel (PLAVIX) 75 MG tablet, Take 1 tablet (75 mg total) by mouth daily., Disp: 30 tablet, Rfl: 0 .  donepezil (ARICEPT) 10 MG tablet, Take 1 tablet (10 mg total) by mouth at  bedtime., Disp: 90 tablet, Rfl: 3 .  empagliflozin (JARDIANCE) 10 MG TABS tablet, Take 10 mg by mouth daily., Disp: , Rfl:  .  glucose blood test strip, Use to check blood sugar daily. E11.65, Disp: 100 each, Rfl: 3 .  Iron-FA-B Cmp-C-Biot-Probiotic (FUSION PLUS) CAPS, , Disp: , Rfl:  .  Lancet Devices (LANCING DEVICE) MISC, Use daily to check blood sugar. E11.65, Disp: 1 each, Rfl: 0 .  Lancets MISC, Use to check blood sugar daily. E11.65, Disp: 100 each, Rfl: 3 .  linagliptin (TRADJENTA) 5 MG TABS tablet, Take 5 mg by mouth daily., Disp: , Rfl:  .  megestrol (MEGACE) 40 MG tablet, Take 1 tablet (40 mg total) by mouth daily. Can increase to twice a day in the event of heavy bleeding, Disp: 60 tablet, Rfl: 5 .  metFORMIN (GLUCOPHAGE-XR) 500 MG 24 hr tablet, TAKE 2 TABLETS TWICE A DAY WITH MEAL, NEEDS OFFICE VISIT FOR ADDITIONAL REFILLS, Disp: 56 tablet, Rfl: 0 .  valsartan-hydrochlorothiazide (DIOVAN-HCT) 320-12.5 MG tablet, Take 1 tablet by mouth daily. , Disp: , Rfl:  .  vortioxetine HBr (TRINTELLIX) 5 MG TABS, Take 20 mg by mouth. , Disp: , Rfl:   Allergies  Allergen Reactions  . Glyxambi [Empagliflozin-Linagliptin] Nausea And Vomiting    Makes PT VERY SICK  . Lisinopril Cough   Review of Systems Objective:  There were no vitals  filed for this visit.  General: Well developed, nourished, in no acute distress, alert and oriented x3   Dermatological: Skin is warm, dry and supple bilateral. Nails x 10 are well maintained; remaining integument appears unremarkable at this time. There are no open sores, no preulcerative lesions, no rash or signs of infection present.  Vascular: Dorsalis Pedis artery and Posterior Tibial artery pedal pulses are 2/4 bilateral with immedate capillary fill time. Pedal hair growth present. No varicosities and no lower extremity edema present bilateral.   Neruologic: Grossly intact via light touch bilateral. Vibratory intact via tuning fork bilateral. Protective  threshold with Semmes Wienstein monofilament intact to all pedal sites bilateral. Patellar and Achilles deep tendon reflexes 2+ bilateral. No Babinski or clonus noted bilateral.   Musculoskeletal: No gross boney pedal deformities bilateral. No pain, crepitus, or limitation noted with foot and ankle range of motion bilateral. Muscular strength 5/5 in all groups tested bilateral.  She has pain on palpation medial calcaneal tubercle of the right heel.  Flexible pes planus is noted.  No pain on medial and lateral compression of the calcaneus.  Gait: Unassisted, Nonantalgic.    Radiographs:  Radiographs taken today demonstrate pes planus with soft tissue increase in density plantar fascial kidney insertion site of the right heel.  Assessment & Plan:   Assessment: Plantar fasciitis.  Plan: At this point due to her medications and history reviewed inject the right heel 20 mg Kenalog 5 mg Marcaine.  Discussed appropriate shoe gear stretching exercises ice therapy sugar modifications placed in a plantar fascial brace and will follow up with her in 6 weeks.     [place] [address], [place]

## 2020-05-26 NOTE — Telephone Encounter (Signed)
Pharmacist Molly Maduro from [facility] called to request Megace refill from Dr. Jolayne Panther. Jamie from Dr. Tedra [place] office called to request refill for Megace. Called [facility] to verify an electronic rx may be sent and to notify Molly Maduro we will have that completed. Attempted to call Dr. Tedra [place] office with no answer.   Encounter routed to correct [facility] location for refill.

## 2020-05-27 ENCOUNTER — Telehealth: Payer: Self-pay

## 2020-05-27 NOTE — Telephone Encounter (Signed)
Called to follow up with patient about megace refills from staff message, no answer, left vm to call. Pt currently has refills available at [facility].

## 2020-06-15 DIAGNOSIS — I129 Hypertensive chronic kidney disease with stage 1 through stage 4 chronic kidney disease, or unspecified chronic kidney disease: Secondary | ICD-10-CM | POA: Diagnosis not present

## 2020-06-15 DIAGNOSIS — E7849 Other hyperlipidemia: Secondary | ICD-10-CM | POA: Diagnosis not present

## 2020-06-15 DIAGNOSIS — N1832 Chronic kidney disease, stage 3b: Secondary | ICD-10-CM | POA: Diagnosis not present

## 2020-06-15 DIAGNOSIS — E1122 Type 2 diabetes mellitus with diabetic chronic kidney disease: Secondary | ICD-10-CM | POA: Diagnosis not present

## 2020-06-17 DIAGNOSIS — I6930 Unspecified sequelae of cerebral infarction: Secondary | ICD-10-CM | POA: Diagnosis not present

## 2020-06-18 DIAGNOSIS — B3741 Candidal cystitis and urethritis: Secondary | ICD-10-CM | POA: Diagnosis not present

## 2020-07-15 DIAGNOSIS — E7849 Other hyperlipidemia: Secondary | ICD-10-CM | POA: Diagnosis not present

## 2020-07-15 DIAGNOSIS — I129 Hypertensive chronic kidney disease with stage 1 through stage 4 chronic kidney disease, or unspecified chronic kidney disease: Secondary | ICD-10-CM | POA: Diagnosis not present

## 2020-07-15 DIAGNOSIS — E1122 Type 2 diabetes mellitus with diabetic chronic kidney disease: Secondary | ICD-10-CM | POA: Diagnosis not present

## 2020-07-15 DIAGNOSIS — N1832 Chronic kidney disease, stage 3b: Secondary | ICD-10-CM | POA: Diagnosis not present

## 2020-07-19 DIAGNOSIS — E119 Type 2 diabetes mellitus without complications: Secondary | ICD-10-CM | POA: Diagnosis not present

## 2020-07-19 DIAGNOSIS — H0288A Meibomian gland dysfunction right eye, upper and lower eyelids: Secondary | ICD-10-CM | POA: Diagnosis not present

## 2020-07-19 DIAGNOSIS — H524 Presbyopia: Secondary | ICD-10-CM | POA: Diagnosis not present

## 2020-07-19 DIAGNOSIS — H5213 Myopia, bilateral: Secondary | ICD-10-CM | POA: Diagnosis not present

## 2020-07-19 DIAGNOSIS — H25813 Combined forms of age-related cataract, bilateral: Secondary | ICD-10-CM | POA: Diagnosis not present

## 2020-07-19 DIAGNOSIS — H0288B Meibomian gland dysfunction left eye, upper and lower eyelids: Secondary | ICD-10-CM | POA: Diagnosis not present

## 2020-07-19 DIAGNOSIS — H52223 Regular astigmatism, bilateral: Secondary | ICD-10-CM | POA: Diagnosis not present

## 2020-07-19 DIAGNOSIS — H35033 Hypertensive retinopathy, bilateral: Secondary | ICD-10-CM | POA: Diagnosis not present

## 2020-08-15 DIAGNOSIS — E7849 Other hyperlipidemia: Secondary | ICD-10-CM | POA: Diagnosis not present

## 2020-08-15 DIAGNOSIS — I129 Hypertensive chronic kidney disease with stage 1 through stage 4 chronic kidney disease, or unspecified chronic kidney disease: Secondary | ICD-10-CM | POA: Diagnosis not present

## 2020-08-15 DIAGNOSIS — E1122 Type 2 diabetes mellitus with diabetic chronic kidney disease: Secondary | ICD-10-CM | POA: Diagnosis not present

## 2020-08-15 DIAGNOSIS — N1832 Chronic kidney disease, stage 3b: Secondary | ICD-10-CM | POA: Diagnosis not present

## 2020-09-07 DIAGNOSIS — E1169 Type 2 diabetes mellitus with other specified complication: Secondary | ICD-10-CM | POA: Diagnosis not present

## 2020-09-07 DIAGNOSIS — I6930 Unspecified sequelae of cerebral infarction: Secondary | ICD-10-CM | POA: Diagnosis not present

## 2020-09-07 DIAGNOSIS — I10 Essential (primary) hypertension: Secondary | ICD-10-CM | POA: Diagnosis not present

## 2020-09-14 DIAGNOSIS — I129 Hypertensive chronic kidney disease with stage 1 through stage 4 chronic kidney disease, or unspecified chronic kidney disease: Secondary | ICD-10-CM | POA: Diagnosis not present

## 2020-09-14 DIAGNOSIS — E7849 Other hyperlipidemia: Secondary | ICD-10-CM | POA: Diagnosis not present

## 2020-09-14 DIAGNOSIS — N1832 Chronic kidney disease, stage 3b: Secondary | ICD-10-CM | POA: Diagnosis not present

## 2020-09-14 DIAGNOSIS — E1122 Type 2 diabetes mellitus with diabetic chronic kidney disease: Secondary | ICD-10-CM | POA: Diagnosis not present

## 2020-11-13 DIAGNOSIS — I129 Hypertensive chronic kidney disease with stage 1 through stage 4 chronic kidney disease, or unspecified chronic kidney disease: Secondary | ICD-10-CM | POA: Diagnosis not present

## 2020-11-13 DIAGNOSIS — E1122 Type 2 diabetes mellitus with diabetic chronic kidney disease: Secondary | ICD-10-CM | POA: Diagnosis not present

## 2020-11-13 DIAGNOSIS — N1832 Chronic kidney disease, stage 3b: Secondary | ICD-10-CM | POA: Diagnosis not present

## 2020-11-13 DIAGNOSIS — E7849 Other hyperlipidemia: Secondary | ICD-10-CM | POA: Diagnosis not present

## 2020-11-23 ENCOUNTER — Ambulatory Visit: Payer: Medicare HMO | Admitting: Neurology

## 2020-11-23 ENCOUNTER — Encounter: Payer: Self-pay | Admitting: Neurology

## 2020-11-23 VITALS — BP 135/76 | HR 52 | Ht 63.0 in | Wt 220.6 lb

## 2020-11-23 DIAGNOSIS — R413 Other amnesia: Secondary | ICD-10-CM | POA: Diagnosis not present

## 2020-11-23 DIAGNOSIS — I6990 Unspecified sequelae of unspecified cerebrovascular disease: Secondary | ICD-10-CM

## 2020-11-23 DIAGNOSIS — G3184 Mild cognitive impairment, so stated: Secondary | ICD-10-CM

## 2020-11-23 MED ORDER — PREVAGEN 10 MG PO CAPS: 1.0000 | ORAL_CAPSULE | ORAL | 1 refills | Status: AC

## 2020-11-23 NOTE — Patient Instructions (Addendum)
I had a long d/w patient and her daughter about her remote multiple strokes and post stroke mild cognitive impairment and memory loss, risk for recurrent stroke/TIAs, personally independently reviewed imaging studies and stroke evaluation results and answered questions.Continue Plavix for secondary stroke prevention and maintain strict control of hypertension with blood pressure goal below 130/90, diabetes with hemoglobin A1c goal below 6.5% and lipids with LDL cholesterol goal below 70 mg/dL. I also advised the patient to eat a healthy diet with plenty of whole grains, cereals, fruits and vegetables, exercise regularly and maintain ideal body weight .continue Aricept for her mild cognitive impairment and she is tolerating it well and add Prevagen 10 mg daily and increase participation in cognitively challenging activities like solving crossword puzzles, playing bridge and sodoku.  We also discussed memory compensation strategies.  Followup in the future with my nurse practitioner Shanda Bumps in 3 months or call earlier if necessary. Memory Compensation Strategies  1. Use "WARM" strategy.  W= write it down  A= associate it  R= repeat it  M= make a mental note  2.   You can keep a Glass blower/designer.  Use a 3-ring notebook with sections for the following: calendar, important names and phone numbers,  medications, doctors' names/phone numbers, lists/reminders, and a section to journal what you did  each day.   3.    Use a calendar to write appointments down.  4.    Write yourself a schedule for the day.  This can be placed on the calendar or in a separate section of the Memory Notebook.  Keeping a  regular schedule can help memory.  5.    Use medication organizer with sections for each day or morning/evening pills.  You may need help loading it  6.    Keep a basket, or pegboard by the door.  Place items that you need to take out with you in the basket or on the pegboard.  You may also want to  include a  message board for reminders.  7.    Use sticky notes.  Place sticky notes with reminders in a place where the task is performed.  For example: " turn off the  stove" placed by the stove, "lock the door" placed on the door at eye level, " take your medications" on  the bathroom mirror or by the place where you normally take your medications.  8.    Use alarms/timers.  Use while cooking to remind yourself to check on food or as a reminder to take your medicine, or as a  reminder to make a call, or as a reminder to perform another task, etc.

## 2020-11-23 NOTE — Progress Notes (Signed)
[facility] [address] [place]. [name] [address] [phone]       OFFICE CONSULT NOTE  Annette Munoz Date of Birth:  06-Mar-1966 Medical Record Number:  [id number]   Referring MD: Lucianne Lei  Reason for Referral: Memory loss HPI: Annette Munoz is a pleasant 54-year African-American lady seen today for office consultation visit for memory loss following stroke. History is obtained from the patient and her daughters accompanying her as well as review of electronic medical records and reviewed pertinent imaging films in PACS. She has past medical history of diabetes, hypertension, obesity, hyperlipidemia who has history of stroke since October 2016 when she was seen at [facility] for altered mental status and memory difficulties and MRI showed multiple small acute left anterior cerebral artery infarct involving corpus callosum, cingulate gyrus and superior left frontal lobe as well as small infarct involving anterior inferior right thalamus//cerebral peduncle. 2D echo and carotid ultrasound were unremarkable LDL cholesterol 138 and A1c was 7.2. She was subsequently readmitted on 05/18/2016 with new acute infarct involving medial right hemisphere and anteriormost thalamus and genu of internal capsule. TEE at this time was negative for PFO or left atrial thrombus. Transcranial Doppler monitoring was suboptimal and no microembolic signals were noted. 30-day cardiac monitoring was negative for A. fib. She was placed on dual antiplatelet therapy and risk recommended aggressive risk factor modification. She will last seen in the office in clinic by Dr. Erlinda Hong in December 2018 and then lost to follow-up. The patient and daughter state that she has had persistent memory difficulties since her strokes which have been not getting any better. This is mostly short-term memory and otherwise she is quite independent in most of her activities of daily living. She has not had any significant  behavioral changes to the daughter feels she may be depressed as she no longer goes out as much participate in other activities outside the house. She has been pretty compliant with her stroke prevention regimen. She has stopped aspirin and is currently on Plavix alone. Blood pressure is well controlled today it is 135/76. Last hemoglobin A1c was 6. Lipid profile was also satisfactory. She has been started on Aricept for her memory loss and has been taking 10 mg daily and tolerating it well without side effects. She does do solitaire religiously every day but does not do other activities which are cognitively challenging. She has not had any recent lab work or brain imaging studies done. She has had no recent recurrent stroke or TIA symptoms either. ROS:   14 system review of systems is positive for memory loss, decreased short-term memory, weakness, gait difficulty and all other systems negative PMH:  Past Medical History:  Diagnosis Date  . Anemia   . Arthritis   . Diabetes mellitus without complication ([place])   . Hypertension   . Stroke [facility])     Social History:  Social History   Socioeconomic History  . Marital status: Married    Spouse name: Not on file  . Number of children: Not on file  . Years of education: Not on file  . Highest education level: Not on file  Occupational History  . Occupation: disabilaity  Tobacco Use  . Smoking status: Never Smoker  . Smokeless tobacco: Never Used  Vaping Use  . Vaping Use: Never used  Substance and Sexual Activity  . Alcohol use: No    Alcohol/week: 1.0 standard drink    Types: 1 Shots of liquor per week  Comment: occasionally  . Drug use: No  . Sexual activity: Yes    Partners: Male  Other Topics Concern  . Not on file  Social History Narrative   Lives with husband and patient;s brother   Right Handed   Driinks no caffeine   Social Determinants of Health   Financial Resource Strain: Not on file  Food Insecurity: Not on file   Transportation Needs: Not on file  Physical Activity: Not on file  Stress: Not on file  Social Connections: Not on file  Intimate Partner Violence: Not on file    Medications:   Current Outpatient Medications on File Prior to Visit  Medication Sig Dispense Refill  . Alcohol Swabs (ALCOHOL PREP) PADS Use to check blood sugar daily. E11.65 100 each 3  . amLODipine (NORVASC) 10 MG tablet Take 1 tablet (10 mg total) by mouth daily. (Patient taking differently: Take 10 mg by mouth daily. ) 30 tablet 5  . aspirin 325 MG tablet Take 1 tablet (325 mg total) by mouth daily. 90 tablet 3  . atorvastatin (LIPITOR) 80 MG tablet TAKE 1 TABLET (80 MG TOTAL) BY MOUTH DAILY AT 6 PM. (Patient not taking: Reported on 02/09/2020) 90 tablet 1  . blood glucose meter kit and supplies KIT Use daily to check blood sugar. E11.65 1 each 0  . Blood Pressure Monitoring (PREMIUM AUTOMATIC BP MONITOR) DEVI 1 Device by Does not apply route daily. 1 Device 0  . citalopram (CELEXA) 20 MG tablet     . clopidogrel (PLAVIX) 75 MG tablet Take 1 tablet (75 mg total) by mouth daily. 30 tablet 0  . donepezil (ARICEPT) 10 MG tablet Take 1 tablet (10 mg total) by mouth at bedtime. 90 tablet 3  . empagliflozin (JARDIANCE) 10 MG TABS tablet Take 10 mg by mouth daily.    Marland Kitchen glucose blood test strip Use to check blood sugar daily. E11.65 100 each 3  . Iron-FA-B Cmp-C-Biot-Probiotic (FUSION PLUS) CAPS     . Lancet Devices (LANCING DEVICE) MISC Use daily to check blood sugar. E11.65 1 each 0  . Lancets MISC Use to check blood sugar daily. E11.65 100 each 3  . linagliptin (TRADJENTA) 5 MG TABS tablet Take 5 mg by mouth daily.    . megestrol (MEGACE) 40 MG tablet Take 1 tablet (40 mg total) by mouth daily. Can increase to twice a day in the event of heavy bleeding 60 tablet 5  . metFORMIN (GLUCOPHAGE-XR) 500 MG 24 hr tablet TAKE 2 TABLETS TWICE A DAY WITH MEAL, NEEDS OFFICE VISIT FOR ADDITIONAL REFILLS 56 tablet 0  .  valsartan-hydrochlorothiazide (DIOVAN-HCT) 320-12.5 MG tablet Take 1 tablet by mouth daily.     Marland Kitchen vortioxetine HBr (TRINTELLIX) 5 MG TABS Take 20 mg by mouth.      No current facility-administered medications on file prior to visit.    Allergies:   Allergies  Allergen Reactions  . Glyxambi [Empagliflozin-Linagliptin] Nausea And Vomiting    Makes PT VERY SICK  . Lisinopril Cough    Physical Exam General: Obese middle-aged African-American lady seated, in no evident distress Head: head normocephalic and atraumatic.   Neck: supple with no carotid or supraclavicular bruits Cardiovascular: regular rate and rhythm, no murmurs Musculoskeletal: no deformity Skin:  no rash/petichiae Vascular:  Normal pulses all extremities  Neurologic Exam Mental Status: Awake and fully alert. Oriented to place and time. Recent and remote memory intact. Attention span, concentration and fund of knowledge appropriate. Mood and affect appropriate. Mini-Mental status exam  score 25/30 with deficits in recall and attention. Geriatric depression scale scored 4 not depressed. Able to name only 8 animals which can walk on 4 legs. Clock drawing 4/4. Able to copy intersecting pentagons with slight difficulty. Cranial Nerves: Fundoscopic exam reveals sharp disc margins. Pupils equal, briskly reactive to light. Extraocular movements full without nystagmus. Visual fields full to confrontation. Hearing intact. Facial sensation intact. Mild right lower face asymmetry. Tongue, palate moves normally and symmetrically.  Motor: Normal bulk and tone. Normal strength in all tested extremity muscles. Diminished fine finger movements on the right. Orbits left over right upper extremity. Mild right lower leg weakness 4/5 with right foot drop and ankle dorsiflexor weakness. Sensory.: intact to touch , pinprick , position and vibratory sensation.  Coordination: Rapid alternating movements normal in all extremities. Finger-to-nose and  heel-to-shin performed accurately bilaterally. Gait and Station: Arises from chair without difficulty. Stance is normal. Gait demonstrates stiffness and dragging of the right leg with right foot drop. Unable to tandem walk without difficulty  reflexes: 1+ and asymmetric and brisker on the right compared to the left.. Toes downgoing.   NIHSS  2 Modified Rankin  2   ASSESSMENT: 55 year old African-American lady with a remote history of multiple bilateral mostly subcortical strokes with residual mild cognitive impairment and memory loss post stroke. Vascular risk factors of hypertension, diabetes, hyperlipidemia ,obesity and intracranial stenosis     PLAN: I had a long d/w patient and her daughter about her remote multiple strokes and post stroke mild cognitive impairment and memory loss, risk for recurrent stroke/TIAs, personally independently reviewed imaging studies and stroke evaluation results and answered questions.Continue Plavix for secondary stroke prevention and maintain strict control of hypertension with blood pressure goal below 130/90, diabetes with hemoglobin A1c goal below 6.5% and lipids with LDL cholesterol goal below 70 mg/dL. I also advised the patient to eat a healthy diet with plenty of whole grains, cereals, fruits and vegetables, exercise regularly and maintain ideal body weight .continue Aricept for her mild cognitive impairment and she is tolerating it well and and Prevagen 10 mg daily and increase participation in cognitively challenging activities like solving crossword puzzles, playing bridge and sodoku.  We also discussed memory compensation strategies.  Followup in the future with my nurse practitioner Janett Billow in 3 months or call earlier if necessary. Greater than 50% time during this 45-minute consultation visit was spent on counseling and coordination of care about her memory loss and mild cognitive impairment and previous strokes and answering questions Antony Contras,  MD  [facility] [address] [place], [place] [address]  Phone [phone] Fax [phone] Note: This document was prepared with digital dictation and possible smart phrase technology. Any transcriptional errors that result from this process are unintentional.

## 2020-11-24 ENCOUNTER — Telehealth: Payer: Self-pay | Admitting: Neurology

## 2020-11-24 LAB — DEMENTIA PANEL
Homocysteine: 17.1 umol/L — ABNORMAL HIGH (ref 0.0–14.5)
RPR Ser Ql: NONREACTIVE
TSH: 1.32 u[IU]/mL (ref 0.450–4.500)
Vitamin B-12: 805 pg/mL (ref 232–1245)

## 2020-11-24 NOTE — Telephone Encounter (Signed)
Annette Munoz Berkley Harvey: [id number] (exp. 11/24/20 to 12/24/20) order sent to GI. They will reach out to the patient to schedule.

## 2020-11-24 NOTE — Telephone Encounter (Signed)
[employer] pending uploaded notes on the portal  

## 2020-11-25 ENCOUNTER — Telehealth: Payer: Self-pay | Admitting: Neurology

## 2020-11-25 ENCOUNTER — Other Ambulatory Visit: Payer: Self-pay | Admitting: Neurology

## 2020-11-25 MED ORDER — FOLIC ACID 1 MG PO TABS
1.0000 mg | ORAL_TABLET | Freq: Every day | ORAL | 3 refills | Status: DC
Start: 2020-11-25 — End: 2020-12-13

## 2020-11-25 NOTE — Telephone Encounter (Signed)
Pt's daughter(on DPR) is asking for the results to blood work drawn earlier this week

## 2020-11-25 NOTE — Progress Notes (Signed)
Kindly inform the patient that lab work for reversible causes of memory loss was normal except for elevated homocystine and she needs to quit smoking if she smokes and to start taking folic acid 1 mg daily.  I have sent the prescription to her pharmacy

## 2020-11-25 NOTE — Telephone Encounter (Signed)
Kindly inform the patient that lab work for reversible causes of memory loss is normal except for elevated homocystine levels which is a protein in the blood and she needs to start taking folic acid 1 mg daily to help bring it down.  If she is smoking she needs to quit smoking.

## 2020-11-29 NOTE — Telephone Encounter (Signed)
Called and spoke to patient's daughter Morey Hummingbird (on [place]).  Stated a prescription had already been called in and she had begun taking it last week to lower her homocystine levels.  Patient denied further questions, verbalized understanding and expressed appreciation for the phone call.

## 2020-12-01 ENCOUNTER — Other Ambulatory Visit: Payer: Self-pay

## 2020-12-01 ENCOUNTER — Ambulatory Visit
Admission: RE | Admit: 2020-12-01 | Discharge: 2020-12-01 | Disposition: A | Discharge status: Home / Self Care | Payer: Medicare HMO | Source: Ambulatory Visit | Attending: Neurology | Admitting: Neurology

## 2020-12-01 DIAGNOSIS — R413 Other amnesia: Secondary | ICD-10-CM | POA: Diagnosis not present

## 2020-12-01 DIAGNOSIS — G3184 Mild cognitive impairment, so stated: Secondary | ICD-10-CM | POA: Diagnosis not present

## 2020-12-03 NOTE — Progress Notes (Signed)
Kindly inform the patient that MRI scan of the brain showed mild age-related changes of the hardening of the tiny blood vessels in the brain.  No new or worrisome finding

## 2020-12-06 ENCOUNTER — Ambulatory Visit (INDEPENDENT_AMBULATORY_CARE_PROVIDER_SITE_OTHER): Payer: Medicare HMO | Admitting: Neurology

## 2020-12-06 DIAGNOSIS — R413 Other amnesia: Secondary | ICD-10-CM

## 2020-12-06 DIAGNOSIS — R410 Disorientation, unspecified: Secondary | ICD-10-CM | POA: Diagnosis not present

## 2020-12-06 DIAGNOSIS — G3184 Mild cognitive impairment, so stated: Secondary | ICD-10-CM

## 2020-12-06 DIAGNOSIS — I6990 Unspecified sequelae of unspecified cerebrovascular disease: Secondary | ICD-10-CM

## 2020-12-07 ENCOUNTER — Telehealth: Payer: Self-pay | Admitting: Neurology

## 2020-12-07 ENCOUNTER — Telehealth: Payer: Self-pay | Admitting: Emergency Medicine

## 2020-12-07 NOTE — Telephone Encounter (Signed)
Called patient and advised that she just had her EEG completed on 12/06/20.  Dr. Pearlean Brownie will finalize the results and I will call with the results. Patient denied further questions, verbalized understanding and expressed appreciation for the phone call.

## 2020-12-07 NOTE — Telephone Encounter (Signed)
-----   Message from Micki Riley, MD sent at 12/03/2020  4:25 PM EST ----- Kindly inform the patient that MRI scan of the brain showed mild age-related changes of the hardening of the tiny blood vessels in the brain.  No new or worrisome finding

## 2020-12-07 NOTE — Telephone Encounter (Signed)
Called patient's daughter and discussed Dr. Marlis Edelson review and findings regarding results of MRI.    Patient denied further questions, verbalized understanding and expressed appreciation for the phone call.

## 2020-12-07 NOTE — Telephone Encounter (Signed)
Pt.'s daughter Oneida Arenas is on [place]. She is calling for mom's EEG results. Please advise.

## 2020-12-13 ENCOUNTER — Telehealth: Payer: Self-pay | Admitting: Neurology

## 2020-12-13 ENCOUNTER — Other Ambulatory Visit: Payer: Self-pay | Admitting: Emergency Medicine

## 2020-12-13 DIAGNOSIS — I129 Hypertensive chronic kidney disease with stage 1 through stage 4 chronic kidney disease, or unspecified chronic kidney disease: Secondary | ICD-10-CM | POA: Diagnosis not present

## 2020-12-13 DIAGNOSIS — E7849 Other hyperlipidemia: Secondary | ICD-10-CM | POA: Diagnosis not present

## 2020-12-13 DIAGNOSIS — N1832 Chronic kidney disease, stage 3b: Secondary | ICD-10-CM | POA: Diagnosis not present

## 2020-12-13 MED ORDER — FOLIC ACID 1 MG PO TABS: 1.0000 mg | ORAL_TABLET | Freq: Every day | ORAL | 3 refills | Status: DC

## 2020-12-13 NOTE — Telephone Encounter (Signed)
[facility]) called, update pharmacy to [facility] for folic acid (FOLVITE) 1 MG tablet. Can fax prescription to [phone]. If you have any question can contact: [phone] or [phone].

## 2020-12-14 NOTE — Telephone Encounter (Signed)
Pt.'s daughter Oneida Arenas is wanting to speak with RN for questions about mom's EEG results. Please advise.

## 2020-12-14 NOTE — Telephone Encounter (Signed)
Called Annette Munoz back and read to her the summary of the EEG.  Summary  Normal electroencephalogram, awake, asleep and with activation procedures. There are no focal lateralizing or epileptiform features.    Patient denied further questions, verbalized understanding and expressed appreciation for the phone call.

## 2020-12-17 NOTE — Progress Notes (Signed)
Kindly inform the patient that EEG study was normal

## 2020-12-20 ENCOUNTER — Other Ambulatory Visit: Payer: Self-pay | Admitting: Emergency Medicine

## 2020-12-20 ENCOUNTER — Telehealth: Payer: Self-pay | Admitting: *Deleted

## 2020-12-20 MED ORDER — FOLIC ACID 1 MG PO TABS: 1.0000 mg | ORAL_TABLET | Freq: Every day | ORAL | 3 refills | Status: DC

## 2020-12-20 NOTE — Telephone Encounter (Signed)
Called patient, daughter answered phone. She is on DPR. Advised her the patient's EEG study was normal . She  verbalized understanding, appreciation.

## 2020-12-20 NOTE — Telephone Encounter (Signed)
[facility]) called, refill for Folic acid(FOLVITE) 1 MG tablet was sent to the wrong pharmacy. Please sent refill to [facility]. Fax prescription to [phone]. If you have any questions can contact [phone].

## 2021-01-06 DIAGNOSIS — E1122 Type 2 diabetes mellitus with diabetic chronic kidney disease: Secondary | ICD-10-CM | POA: Diagnosis not present

## 2021-01-06 DIAGNOSIS — I129 Hypertensive chronic kidney disease with stage 1 through stage 4 chronic kidney disease, or unspecified chronic kidney disease: Secondary | ICD-10-CM | POA: Diagnosis not present

## 2021-01-06 DIAGNOSIS — I10 Essential (primary) hypertension: Secondary | ICD-10-CM | POA: Diagnosis not present

## 2021-01-06 DIAGNOSIS — I6930 Unspecified sequelae of cerebral infarction: Secondary | ICD-10-CM | POA: Diagnosis not present

## 2021-01-06 DIAGNOSIS — E7800 Pure hypercholesterolemia, unspecified: Secondary | ICD-10-CM | POA: Diagnosis not present

## 2021-01-06 DIAGNOSIS — N1832 Chronic kidney disease, stage 3b: Secondary | ICD-10-CM | POA: Diagnosis not present

## 2021-01-06 DIAGNOSIS — E1169 Type 2 diabetes mellitus with other specified complication: Secondary | ICD-10-CM | POA: Diagnosis not present

## 2021-01-13 DIAGNOSIS — E1122 Type 2 diabetes mellitus with diabetic chronic kidney disease: Secondary | ICD-10-CM | POA: Diagnosis not present

## 2021-01-13 DIAGNOSIS — I129 Hypertensive chronic kidney disease with stage 1 through stage 4 chronic kidney disease, or unspecified chronic kidney disease: Secondary | ICD-10-CM | POA: Diagnosis not present

## 2021-01-13 DIAGNOSIS — N1832 Chronic kidney disease, stage 3b: Secondary | ICD-10-CM | POA: Diagnosis not present

## 2021-01-13 DIAGNOSIS — E7849 Other hyperlipidemia: Secondary | ICD-10-CM | POA: Diagnosis not present

## 2021-02-12 DIAGNOSIS — E7849 Other hyperlipidemia: Secondary | ICD-10-CM | POA: Diagnosis not present

## 2021-02-12 DIAGNOSIS — E1122 Type 2 diabetes mellitus with diabetic chronic kidney disease: Secondary | ICD-10-CM | POA: Diagnosis not present

## 2021-02-12 DIAGNOSIS — N1832 Chronic kidney disease, stage 3b: Secondary | ICD-10-CM | POA: Diagnosis not present

## 2021-02-12 DIAGNOSIS — I129 Hypertensive chronic kidney disease with stage 1 through stage 4 chronic kidney disease, or unspecified chronic kidney disease: Secondary | ICD-10-CM | POA: Diagnosis not present

## 2021-03-15 ENCOUNTER — Encounter: Payer: Self-pay | Admitting: Adult Health

## 2021-03-15 ENCOUNTER — Ambulatory Visit (INDEPENDENT_AMBULATORY_CARE_PROVIDER_SITE_OTHER): Payer: Medicare HMO | Admitting: Adult Health

## 2021-03-15 ENCOUNTER — Other Ambulatory Visit: Payer: Self-pay

## 2021-03-15 VITALS — BP 138/81 | HR 51 | Ht 62.0 in | Wt 228.0 lb

## 2021-03-15 DIAGNOSIS — I129 Hypertensive chronic kidney disease with stage 1 through stage 4 chronic kidney disease, or unspecified chronic kidney disease: Secondary | ICD-10-CM | POA: Diagnosis not present

## 2021-03-15 DIAGNOSIS — E7211 Homocystinuria: Secondary | ICD-10-CM | POA: Diagnosis not present

## 2021-03-15 DIAGNOSIS — G3184 Mild cognitive impairment, so stated: Secondary | ICD-10-CM | POA: Diagnosis not present

## 2021-03-15 DIAGNOSIS — Z8673 Personal history of transient ischemic attack (TIA), and cerebral infarction without residual deficits: Secondary | ICD-10-CM

## 2021-03-15 DIAGNOSIS — E7849 Other hyperlipidemia: Secondary | ICD-10-CM | POA: Diagnosis not present

## 2021-03-15 DIAGNOSIS — H547 Unspecified visual loss: Secondary | ICD-10-CM | POA: Diagnosis not present

## 2021-03-15 DIAGNOSIS — N1832 Chronic kidney disease, stage 3b: Secondary | ICD-10-CM | POA: Diagnosis not present

## 2021-03-15 DIAGNOSIS — E1122 Type 2 diabetes mellitus with diabetic chronic kidney disease: Secondary | ICD-10-CM | POA: Diagnosis not present

## 2021-03-15 NOTE — Patient Instructions (Addendum)
Your Plan:  Continue Aricept 10 mg nightly  We will recheck your homocysteine level and vitamin B6 level today - continue folic acid 1mg  daily  Please ensure you follow-up with your eye doctor and request visual field test - this can better evaluate potential visual deficit (questional right eye upper temporal visual field deficit)  Continue Plavix and atorvastatin for secondary stroke prevention and routine follow-up with your PCP for aggressive stroke risk factor management - request lab work be faxed to office for further review    Follow-up in 6 months or call earlier if needed     Thank you for coming to see at [facility]. I hope we have been able to provide you high quality care today.  You may receive a patient satisfaction survey over the next few weeks. We would appreciate your feedback and comments so that we may continue to improve ourselves and the health of our patients.   Homocysteine Test Why am I having this test? The homocysteine test may be done for several reasons. You may have this test if:  You are found to have hardening of your coronary arteries but have no other known risk factors for cardiovascular disease.  You have a strong family history of heart or blood vessel disease at a very young age.  You have a deficiency of vitamins B6, B12, or folate. In people with these vitamin deficiencies, the homocysteine blood test may be performed to help check for malnutrition. The test may also be done for an infant or child if there is concern that he or she was born with a condition that causes elevated levels of homocysteine. What is being tested? This test measures how much homocysteine is in your blood. Homocysteine is an amino acid that is made when certain proteins are broken down (metabolized). A high level of homocysteine may put you at risk for heart disease and blood vessel disease throughout your body. The test may involve repeated  blood tests over 2-24 hours to determine homocysteine levels over time. It may also involve having your blood tested before and after drinking juice that is mixed with another amino acid called methionine. What kind of sample is taken? A blood sample is required for this test. It is usually collected by inserting a needle into a blood vessel.   How do I prepare for this test? Do not eat or drink anything for 10-12 hours before the test, or as directed by your health care provider. Tell a health care provider about:  Any medical conditions you have, especially if you have kidney disease. If you have kidney disease, ask if it is safe for you to have this test.  Whether you have a low intake of B vitamins.  Whether you smoke. How are the results reported? Your test results will be reported as a value that indicates how much homocysteine is in your blood. This will be given as micromoles of homocysteine per liter of blood (micromoles/L). Your health care provider will compare your results to normal ranges that were established after testing a large group of people (reference ranges). Reference ranges may vary among labs and hospitals. For this test, common reference ranges are:  Normal: less than 12 micromoles/L.  Borderline high: 12-15 micromoles/L. Homocysteine levels normally increase with age. Men often have higher homocysteine levels than women. What do the results mean? A homocysteine level that is higher than 15 micromoles/L may indicate that you have an increased risk for cardiovascular disease,  cerebrovascular disease, and peripheral vascular disease. This can increase your risk for:  Heart attack.  Stroke.  Problems with blood flow to the feet and legs. This may cause ulcers or sores that take longer to heal. High levels of homocysteine may also indicate:  Vitamin B6 or B12 deficiency.  Folate deficiency.  Malnutrition. Talk with your health care provider about what your  results mean. Questions to ask your health care provider Ask your health care provider, or the department that is doing the test:  When will my results be ready?  How will I get my results?  What are my treatment options?  What other tests do I need?  What are my next steps? Summary  A homocysteine test may be done if you are found to have hardening arteries or have symptoms of coronary disease at a very young age. It may also be done to monitor for malnutrition if you have a vitamin B6 or B12 deficiency.  A high level of homocysteine may put you at increased risk for cardiovascular disease, cerebrovascular disease, and peripheral vascular disease.  Talk with your health care provider about what your results mean. This information is not intended to replace advice given to you by your health care provider. Make sure you discuss any questions you have with your health care provider. Document Revised: 01/21/2020 Document Reviewed: 01/21/2020 Elsevier Patient Education  2021 [employer].

## 2021-03-15 NOTE — Progress Notes (Signed)
[facility] [address] [place]. [place] [address] [phone]       OFFICE FOLLOW UP NOTE  [name] Date of Birth:  [birth year] Medical Record Number:  [id number]   Referring MD: Lucianne Lei  Reason for Referral: Memory loss  Chief Complaint  Patient presents with  . Follow-up    RM 14 with daughter [name]) Pt is well and stable. No new complaints       HPI:   Today, 03/15/2021, Annette Munoz returns for stroke follow-up after prior initial consult visit with Dr. Leonie Man 3 months ago.  She is accompanied by her daughter.  Stable since prior visit without new stroke/TIA symptoms.  Residual cognitive impairment has been stable.  She is able to maintain all ADLs independently and some IADLs with family assistance as needed.  She does not drive.  Reversible causes of memory loss evaluated after prior visit which was all satisfactory except elevated homocystine level and recommend initiating folic acid as well as tobacco cessation.  Reports compliance with folic acid as well as Aricept tolerating without side effects.  Compliant on Plavix and atorvastatin 80 mg daily without associated side effects Blood pressure today 138/81 Glucose levels not routinely monitored Routinely follows with Dr. Criss Rosales for HTN, HLD and DM management  No new concerns at this time    History provided for reference purposes only Consult visit 11/23/2020 Dr. Leonie Man: Annette Munoz is a pleasant Annette Munoz seen today for office consultation visit for memory loss following stroke. History is obtained from the patient and her daughters accompanying her as well as review of electronic medical records and reviewed pertinent imaging films in PACS. She has past medical history of diabetes, hypertension, obesity, hyperlipidemia who has history of stroke since October 2016 when she was seen at [facility] for altered mental status and memory difficulties and MRI showed  multiple small acute left anterior cerebral artery infarct involving corpus callosum, cingulate gyrus and superior left frontal lobe as well as small infarct involving anterior inferior right thalamus//cerebral peduncle. 2D echo and carotid ultrasound were unremarkable LDL cholesterol 138 and A1c was 7.2. She was subsequently readmitted on 05/18/2016 with new acute infarct involving medial right hemisphere and anteriormost thalamus and genu of internal capsule. TEE at this time was negative for PFO or left atrial thrombus. Transcranial Doppler monitoring was suboptimal and no microembolic signals were noted. 30-day cardiac monitoring was negative for A. fib. She was placed on dual antiplatelet therapy and risk recommended aggressive risk factor modification. She will last seen in the office in clinic by Dr. Erlinda Hong in December 2018 and then lost to follow-up. The patient and daughter state that she has had persistent memory difficulties since her strokes which have been not getting any better. This is mostly short-term memory and otherwise she is quite independent in most of her activities of daily living. She has not had any significant behavioral changes to the daughter feels she may be depressed as she no longer goes out as much participate in other activities outside the house. She has been pretty compliant with her stroke prevention regimen. She has stopped aspirin and is currently on Plavix alone. Blood pressure is well controlled today it is 135/76. Last hemoglobin A1c was 6. Lipid profile was also satisfactory. She has been started on Aricept for her memory loss and has been taking 10 mg daily and tolerating it well without side effects. She does do solitaire religiously every day but does not do  other activities which are cognitively challenging. She has not had any recent lab work or brain imaging studies done. She has had no recent recurrent stroke or TIA symptoms either.    ROS:   14 system review of  systems is positive for those listed in HPI and all other systems negative    PMH:  Past Medical History:  Diagnosis Date  . Anemia   . Arthritis   . Diabetes mellitus without complication ([place])   . Hypertension   . Stroke [facility])     Social History:  Social History   Socioeconomic History  . Marital status: Married    Spouse name: Not on file  . Number of children: Not on file  . Years of education: Not on file  . Highest education level: Not on file  Occupational History  . Occupation: disabilaity  Tobacco Use  . Smoking status: Never Smoker  . Smokeless tobacco: Never Used  Vaping Use  . Vaping Use: Never used  Substance and Sexual Activity  . Alcohol use: No    Alcohol/week: 1.0 standard drink    Types: 1 Shots of liquor per week    Comment: occasionally  . Drug use: No  . Sexual activity: Yes    Partners: Male  Other Topics Concern  . Not on file  Social History Narrative   Lives with husband and patient;s brother   Right Handed   Driinks no caffeine   Social Determinants of Health   Financial Resource Strain: Not on file  Food Insecurity: Not on file  Transportation Needs: Not on file  Physical Activity: Not on file  Stress: Not on file  Social Connections: Not on file  Intimate Partner Violence: Not on file    Medications:   Current Outpatient Medications on File Prior to Visit  Medication Sig Dispense Refill  . Alcohol Swabs (ALCOHOL PREP) PADS Use to check blood sugar daily. E11.65 100 each 3  . amLODipine (NORVASC) 10 MG tablet Take 1 tablet (10 mg total) by mouth daily. (Patient taking differently: Take 10 mg by mouth daily.) 30 tablet 5  . atorvastatin (LIPITOR) 80 MG tablet TAKE 1 TABLET (80 MG TOTAL) BY MOUTH DAILY AT 6 PM. 90 tablet 1  . blood glucose meter kit and supplies KIT Use daily to check blood sugar. E11.65 1 each 0  . Blood Pressure Monitoring (PREMIUM AUTOMATIC BP MONITOR) DEVI 1 Device by Does not apply route daily. 1 Device 0   . citalopram (CELEXA) 20 MG tablet     . clopidogrel (PLAVIX) 75 MG tablet Take 1 tablet (75 mg total) by mouth daily. 30 tablet 0  . donepezil (ARICEPT) 10 MG tablet Take 1 tablet (10 mg total) by mouth at bedtime. 90 tablet 3  . folic acid (FOLVITE) 1 MG tablet Take 1 tablet (1 mg total) by mouth daily. 90 tablet 3  . glucose blood test strip Use to check blood sugar daily. E11.65 100 each 3  . GLYXAMBI 10-5 MG TABS     . Iron-FA-B Cmp-C-Biot-Probiotic (FUSION PLUS) CAPS     . Lancet Devices (LANCING DEVICE) MISC Use daily to check blood sugar. E11.65 1 each 0  . Lancets MISC Use to check blood sugar daily. E11.65 100 each 3  . linagliptin (TRADJENTA) 5 MG TABS tablet Take 5 mg by mouth daily.    . megestrol (MEGACE) 40 MG tablet Take 1 tablet (40 mg total) by mouth daily. Can increase to twice a day in the event  of heavy bleeding 60 tablet 5  . metFORMIN (GLUCOPHAGE-XR) 500 MG 24 hr tablet TAKE 2 TABLETS TWICE A DAY WITH MEAL, NEEDS OFFICE VISIT FOR ADDITIONAL REFILLS 56 tablet 0  . valsartan-hydrochlorothiazide (DIOVAN-HCT) 320-12.5 MG tablet Take 1 tablet by mouth daily.      No current facility-administered medications on file prior to visit.    Allergies:   Allergies  Allergen Reactions  . Lisinopril Cough    Physical Exam Today's Vitals   03/15/21 0820  BP: 138/81  Pulse: (!) 51  Weight: 228 lb (103.4 kg)  Height: 5' 2"  (1.575 m)   Body mass index is 41.7 kg/m.  General: Annette Munoz seated, in no evident distress Head: head normocephalic and atraumatic.   Neck: supple with no carotid or supraclavicular bruits Cardiovascular: regular rate and rhythm, no murmurs Musculoskeletal: no deformity Skin:  no rash/petichiae Vascular:  Normal pulses all extremities  Neurologic Exam Mental Status: Awake and fully alert.  Fluent speech and language.  Oriented to place and time. Recent memory impaired and remote memory intact. Attention span,  concentration and fund of knowledge slightly diminished with daughter providing majority of history. Mood and affect appropriate. MMSE not completed today Cranial Nerves: Pupils equal, briskly reactive to light. Extraocular movements full without nystagmus. Visual fields possible OD temporal visual field decreased vision but difficulty fully testing due to difficulty understanding directions. Hearing intact. Facial sensation intact. Mild right lower face asymmetry. Tongue, palate moves normally and symmetrically.  Motor: Normal bulk and tone. Normal strength in all tested extremity muscles except diminished fine finger movements on the right. Orbits left over right upper extremity. Sensory.: intact to touch , pinprick , position and vibratory sensation.  Coordination: Rapid alternating movements normal in all extremities except slightly decreased right hand. Finger-to-nose and heel-to-shin performed accurately bilaterally. Gait and Station: Arises from chair without difficulty. Stance is normal. Gait demonstrates decreased stride length and step height of right leg without use of assistive device.  Unable to complete tandem walk and heel toe reflexes: 1+ and asymmetric and brisker on the right compared to the left.. Toes downgoing.       ASSESSMENT: 55 year old African-American Munoz with a remote history of multiple bilateral mostly subcortical strokes with residual mild cognitive impairment and memory loss post stroke. Vascular risk factors of hypertension, diabetes, hyperlipidemia ,obesity and intracranial stenosis     PLAN:  Remote multiple strokes -post stroke mild cognitive impairment and mild right hemiparesis with gait impairment -Continue Plavix for secondary stroke prevention and  -Extensive discussion and education regarding multiple stroke risk factors and risk of recurrent strokes and importance of close PCP follow-up for aggressive stroke risk factor management to maintain strict  control of hypertension with blood pressure goal below 130/90, diabetes with hemoglobin A1c goal below 7 % and lipids with LDL cholesterol goal below 70 mg/dL.  MCI -Stable since prior visit -Continue Aricept 22m nightly -refill provided -increase participation in cognitively challenging activities like solving crossword puzzles, playing bridge and sodoku.  We also discussed memory compensation strategies.   -Dementia panel showed elevated homocystine level - continue folic acid and repeat levels today as well as B6  Visual impairment -denies any recent visual changes - per daughter, longstanding history of visual impairment -has f/u with ophthalmology in the next couple of weeks for routine yearly follow-up -Questionable OD temporal field visual difficulties but difficulty fully evaluating due to patient having difficulty understanding directions - request visual field test with ophthalmology at follow-up visit  Follow-up in 6 months or call earlier if needed   CC:  [facility] provider: Dr. Trula Slade, MD    Frann Rider, [facility]  [facility] [address] [place], [place] [address]  Phone [phone] Fax [phone] Note: This document was prepared with digital dictation and possible smart phrase technology. Any transcriptional errors that result from this process are unintentional.

## 2021-03-17 ENCOUNTER — Ambulatory Visit: Payer: Medicare HMO | Admitting: Neurology

## 2021-03-21 ENCOUNTER — Telehealth: Payer: Self-pay

## 2021-03-21 NOTE — Telephone Encounter (Signed)
Contacted pt daughter per DPR, informing her that recent homocystine level showed improvement compared to prior level and to continue folic acid. Stated we are still waiting on results of B6 - if abnormal, we will notify her. Daughter had no questions, advised to call us if she does. She understood.

## 2021-03-21 NOTE — Progress Notes (Signed)
I agree with the above plan 

## 2021-03-21 NOTE — Telephone Encounter (Signed)
-----   Message from Ihor Austin, NP sent at 03/17/2021  4:38 PM EDT ----- Please advise patient that recent homocystine level showed improvement compared to prior level and to continue folic acid.  Still waiting on results of B6 - if abnormal, we will notify her.  Thank you.

## 2021-03-25 LAB — VITAMIN B6: Vitamin B6: 31.8 ug/L (ref 3.4–65.2)

## 2021-03-25 LAB — HOMOCYSTEINE: Homocysteine: 15.8 umol/L — ABNORMAL HIGH (ref 0.0–14.5)

## 2021-04-07 ENCOUNTER — Other Ambulatory Visit: Payer: Self-pay | Admitting: Obstetrics and Gynecology

## 2021-04-07 DIAGNOSIS — Z1231 Encounter for screening mammogram for malignant neoplasm of breast: Secondary | ICD-10-CM

## 2021-04-11 DIAGNOSIS — I129 Hypertensive chronic kidney disease with stage 1 through stage 4 chronic kidney disease, or unspecified chronic kidney disease: Secondary | ICD-10-CM | POA: Diagnosis not present

## 2021-04-11 DIAGNOSIS — Z0001 Encounter for general adult medical examination with abnormal findings: Secondary | ICD-10-CM | POA: Diagnosis not present

## 2021-04-11 DIAGNOSIS — G3184 Mild cognitive impairment, so stated: Secondary | ICD-10-CM | POA: Diagnosis not present

## 2021-04-11 DIAGNOSIS — I119 Hypertensive heart disease without heart failure: Secondary | ICD-10-CM | POA: Diagnosis not present

## 2021-04-11 DIAGNOSIS — E1169 Type 2 diabetes mellitus with other specified complication: Secondary | ICD-10-CM | POA: Diagnosis not present

## 2021-04-11 DIAGNOSIS — I6930 Unspecified sequelae of cerebral infarction: Secondary | ICD-10-CM | POA: Diagnosis not present

## 2021-04-11 DIAGNOSIS — R635 Abnormal weight gain: Secondary | ICD-10-CM | POA: Diagnosis not present

## 2021-04-11 DIAGNOSIS — E7849 Other hyperlipidemia: Secondary | ICD-10-CM | POA: Diagnosis not present

## 2021-04-14 DIAGNOSIS — E7849 Other hyperlipidemia: Secondary | ICD-10-CM | POA: Diagnosis not present

## 2021-04-14 DIAGNOSIS — I129 Hypertensive chronic kidney disease with stage 1 through stage 4 chronic kidney disease, or unspecified chronic kidney disease: Secondary | ICD-10-CM | POA: Diagnosis not present

## 2021-04-14 DIAGNOSIS — E1122 Type 2 diabetes mellitus with diabetic chronic kidney disease: Secondary | ICD-10-CM | POA: Diagnosis not present

## 2021-04-14 DIAGNOSIS — N1832 Chronic kidney disease, stage 3b: Secondary | ICD-10-CM | POA: Diagnosis not present

## 2021-05-10 ENCOUNTER — Other Ambulatory Visit: Payer: Self-pay | Admitting: Family Medicine

## 2021-05-10 DIAGNOSIS — R944 Abnormal results of kidney function studies: Secondary | ICD-10-CM

## 2021-05-11 ENCOUNTER — Ambulatory Visit
Admission: RE | Admit: 2021-05-11 | Discharge: 2021-05-11 | Disposition: A | Discharge status: Home / Self Care | Payer: Medicare HMO | Source: Ambulatory Visit | Attending: Family Medicine | Admitting: Family Medicine

## 2021-05-11 DIAGNOSIS — R944 Abnormal results of kidney function studies: Secondary | ICD-10-CM | POA: Diagnosis not present

## 2021-05-15 DIAGNOSIS — E7849 Other hyperlipidemia: Secondary | ICD-10-CM | POA: Diagnosis not present

## 2021-05-15 DIAGNOSIS — I129 Hypertensive chronic kidney disease with stage 1 through stage 4 chronic kidney disease, or unspecified chronic kidney disease: Secondary | ICD-10-CM | POA: Diagnosis not present

## 2021-05-15 DIAGNOSIS — E1122 Type 2 diabetes mellitus with diabetic chronic kidney disease: Secondary | ICD-10-CM | POA: Diagnosis not present

## 2021-05-15 DIAGNOSIS — N1832 Chronic kidney disease, stage 3b: Secondary | ICD-10-CM | POA: Diagnosis not present

## 2021-05-17 DIAGNOSIS — I6930 Unspecified sequelae of cerebral infarction: Secondary | ICD-10-CM | POA: Diagnosis not present

## 2021-05-17 DIAGNOSIS — Z7689 Persons encountering health services in other specified circumstances: Secondary | ICD-10-CM | POA: Diagnosis not present

## 2021-05-17 DIAGNOSIS — I10 Essential (primary) hypertension: Secondary | ICD-10-CM | POA: Diagnosis not present

## 2021-05-17 DIAGNOSIS — E785 Hyperlipidemia, unspecified: Secondary | ICD-10-CM | POA: Diagnosis not present

## 2021-05-17 DIAGNOSIS — E1165 Type 2 diabetes mellitus with hyperglycemia: Secondary | ICD-10-CM | POA: Diagnosis not present

## 2021-05-19 DIAGNOSIS — E1159 Type 2 diabetes mellitus with other circulatory complications: Secondary | ICD-10-CM | POA: Diagnosis not present

## 2021-05-19 DIAGNOSIS — E1169 Type 2 diabetes mellitus with other specified complication: Secondary | ICD-10-CM | POA: Diagnosis not present

## 2021-05-19 DIAGNOSIS — E1165 Type 2 diabetes mellitus with hyperglycemia: Secondary | ICD-10-CM | POA: Diagnosis not present

## 2021-05-24 DIAGNOSIS — E1122 Type 2 diabetes mellitus with diabetic chronic kidney disease: Secondary | ICD-10-CM | POA: Diagnosis not present

## 2021-05-24 DIAGNOSIS — E1165 Type 2 diabetes mellitus with hyperglycemia: Secondary | ICD-10-CM | POA: Diagnosis not present

## 2021-05-24 DIAGNOSIS — I129 Hypertensive chronic kidney disease with stage 1 through stage 4 chronic kidney disease, or unspecified chronic kidney disease: Secondary | ICD-10-CM | POA: Diagnosis not present

## 2021-05-24 DIAGNOSIS — I10 Essential (primary) hypertension: Secondary | ICD-10-CM | POA: Diagnosis not present

## 2021-05-24 DIAGNOSIS — I6930 Unspecified sequelae of cerebral infarction: Secondary | ICD-10-CM | POA: Diagnosis not present

## 2021-05-24 DIAGNOSIS — E782 Mixed hyperlipidemia: Secondary | ICD-10-CM | POA: Diagnosis not present

## 2021-05-30 ENCOUNTER — Ambulatory Visit
Admission: RE | Admit: 2021-05-30 | Discharge: 2021-05-30 | Disposition: A | Discharge status: Home / Self Care | Payer: Medicare HMO | Source: Ambulatory Visit | Attending: Obstetrics and Gynecology | Admitting: Obstetrics and Gynecology

## 2021-05-30 ENCOUNTER — Other Ambulatory Visit: Payer: Self-pay

## 2021-05-30 DIAGNOSIS — Z1231 Encounter for screening mammogram for malignant neoplasm of breast: Secondary | ICD-10-CM

## 2021-05-31 ENCOUNTER — Ambulatory Visit: Payer: Medicare HMO

## 2021-06-11 ENCOUNTER — Ambulatory Visit (HOSPITAL_COMMUNITY)
Admission: EM | Admit: 2021-06-11 | Discharge: 2021-06-11 | Disposition: A | Discharge status: Home / Self Care | Payer: Medicare HMO | Attending: Student | Admitting: Student

## 2021-06-11 ENCOUNTER — Other Ambulatory Visit: Payer: Self-pay

## 2021-06-11 ENCOUNTER — Encounter (HOSPITAL_COMMUNITY): Payer: Self-pay

## 2021-06-11 DIAGNOSIS — Z7901 Long term (current) use of anticoagulants: Secondary | ICD-10-CM

## 2021-06-11 DIAGNOSIS — Z1152 Encounter for screening for COVID-19: Secondary | ICD-10-CM | POA: Diagnosis not present

## 2021-06-11 DIAGNOSIS — U071 COVID-19: Secondary | ICD-10-CM

## 2021-06-11 MED ORDER — PROMETHAZINE-DM 6.25-15 MG/5ML PO SYRP
5.0000 mL | ORAL_SOLUTION | Freq: Four times a day (QID) | ORAL | 0 refills | Status: DC | PRN
Start: 2021-06-11 — End: 2021-09-14

## 2021-06-11 NOTE — ED Triage Notes (Signed)
T presents with cough and congestion x 2 days. His son tested positive for COVID 3 days ago.

## 2021-06-11 NOTE — ED Provider Notes (Signed)
[place]    CSN: [id number] Arrival date & time: 06/11/21  1156      History   Chief Complaint Chief Complaint  Patient presents with   Cough    + COVID test (home)   Nasal Congestion    HPI Annette Munoz is a 55 y.o. female presenting with cough and congestion x2 days, following covid exposure. Medical history diabetes, hypertension, hyperlipidemia, CVA. Here today with daughter who helps provide history. Denies history pulm ds. OTC delsym providing some relief. Denies SOB, CP, dizziness, weakness. Fully vaccinated and x1 booster for covid19.  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[place])    Gait difficulty    Memory loss 05/22/2016   Confusion    Acute ischemic stroke ([place]) 05/18/2016   Morbid obesity ([place]) 09/30/2015   Diabetes mellitus ([place])    Essential hypertension    Hyperlipidemia    Cerebrovascular accident (CVA) due to thrombosis of cerebral artery ([place])    CVA (cerebral infarction) 08/07/2015   Pure hypercholesterolemia 10/04/2009    Past Surgical History:  Procedure Laterality Date   CESAREAN SECTION     TEE WITHOUT CARDIOVERSION N/A 08/10/2015   Procedure: TRANSESOPHAGEAL ECHOCARDIOGRAM (TEE);  Surgeon: [name], MD;  Location: [facility] ENDOSCOPY;  Service: Cardiovascular;  Laterality: N/A;    OB History     Gravida  2   Para  2   Term  2   Preterm      AB      Living  2      SAB      IAB      Ectopic      Multiple      Live Births  2            Home Medications    Prior to Admission medications   Medication Sig Start Date End Date Taking? Authorizing Provider   promethazine-dextromethorphan (PROMETHAZINE-DM) 6.25-15 MG/5ML syrup Take 5 mLs by mouth 4 (four) times daily as needed for cough. 06/11/21  Yes Hazel Sams, PA-C  Alcohol Swabs (ALCOHOL PREP) PADS Use to check blood sugar daily. E11.65 09/16/15   Gale Journey, Damaris Hippo, PA-C  amLODipine (NORVASC) 10 MG tablet Take 1 tablet (10 mg total) by mouth daily. Patient taking differently: Take 10 mg by mouth daily. 05/23/16   Rosalin Hawking, MD  atorvastatin (LIPITOR) 80 MG tablet TAKE 1 TABLET (80 MG TOTAL) BY MOUTH DAILY AT 6 PM. 10/22/17   Rosalin Hawking, MD  blood glucose meter kit and supplies KIT Use daily to check blood sugar. E11.65 09/16/15   Weber, Damaris Hippo, PA-C  Blood Pressure Monitoring (PREMIUM AUTOMATIC BP MONITOR) DEVI 1 Device by Does not apply route daily. 08/17/15   Weber, Damaris Hippo, PA-C  citalopram (CELEXA) 20 MG tablet  03/30/20   [provider]  clopidogrel (PLAVIX) 75 MG tablet Take 1 tablet (75 mg total) by mouth daily. 05/21/16   Velvet Bathe, MD  donepezil (ARICEPT) 10 MG tablet Take 1 tablet (10 mg total) by mouth at bedtime. 07/23/17   Rosalin Hawking, MD  folic acid (FOLVITE) 1 MG tablet  Take 1 tablet (1 mg total) by mouth daily. 12/20/20   Kathrynn Ducking, MD  glucose blood test strip Use to check blood sugar daily. E11.65 09/16/15   Gale Journey, Damaris Hippo, PA-C  GLYXAMBI 10-5 MG TABS  01/18/21   [provider]  Iron-FA-B Cmp-C-Biot-Probiotic (FUSION PLUS) CAPS  05/14/20   [provider]  Lancet Devices (LANCING DEVICE) MISC Use daily to check blood sugar. E11.65 09/16/15   Mancel Bale, PA-C  Lancets MISC Use to check blood sugar daily. E11.65 09/16/15   Weber, Damaris Hippo, PA-C  linagliptin (TRADJENTA) 5 MG TABS tablet Take 5 mg by mouth daily.    [provider]  megestrol (MEGACE) 40 MG tablet Take 1 tablet (40 mg total) by mouth daily. Can increase to twice a day in the event of heavy bleeding 02/09/20   Constant, Peggy, MD  metFORMIN (GLUCOPHAGE-XR) 500 MG 24 hr tablet TAKE  2 TABLETS TWICE A DAY WITH MEAL, NEEDS OFFICE VISIT FOR ADDITIONAL REFILLS 05/19/17   Gale Journey, Damaris Hippo, PA-C  valsartan-hydrochlorothiazide (DIOVAN-HCT) 320-12.5 MG tablet Take 1 tablet by mouth daily.     [provider]    Family History Family History  Problem Relation Age of Onset   Diabetes Mother    Heart disease Mother    Hypertension Mother    Hypertension Father    Stroke Father    Stroke Maternal Grandmother     Social History Social History   Tobacco Use   Smoking status: Never   Smokeless tobacco: Never  Vaping Use   Vaping Use: Never used  Substance Use Topics   Alcohol use: No    Alcohol/week: 1.0 standard drink    Types: 1 Shots of liquor per week    Comment: occasionally   Drug use: No     Allergies   Lisinopril   Review of Systems Review of Systems  Constitutional:  Negative for appetite change, chills and fever.  HENT:  Positive for congestion. Negative for ear pain, rhinorrhea, sinus pressure, sinus pain and sore throat.   Eyes:  Negative for redness and visual disturbance.  Respiratory:  Positive for cough. Negative for chest tightness, shortness of breath and wheezing.   Cardiovascular:  Negative for chest pain and palpitations.  Gastrointestinal:  Negative for abdominal pain, constipation, diarrhea, nausea and vomiting.  Genitourinary:  Negative for dysuria, frequency and urgency.  Musculoskeletal:  Negative for myalgias.  Neurological:  Negative for dizziness, weakness and headaches.  Psychiatric/Behavioral:  Negative for confusion.   All other systems reviewed and are negative.   Physical Exam Triage Vital Signs ED Triage Vitals  Enc Vitals Group     BP 06/11/21 1318 106/66     Pulse Rate 06/11/21 1318 68     Resp 06/11/21 1318 20     Temp 06/11/21 1318 100.2 F (37.9 C)     Temp Source 06/11/21 1318 Oral     SpO2 06/11/21 1318 96 %     Weight --      Height --      Head Circumference --      Peak Flow --      Pain Score  06/11/21 1317 0     Pain Loc --      Pain Edu? --      Excl. in [place]? --    No data found.  Updated Vital Signs BP 106/66 (BP Location: Right Arm)   Pulse 68   Temp 100.2 F (37.9 C) (Oral)  Resp 20   LMP  (Approximate)   SpO2 96%   Visual Acuity Right Eye Distance:   Left Eye Distance:   Bilateral Distance:    Right Eye Near:   Left Eye Near:    Bilateral Near:     Physical Exam Vitals reviewed.  Constitutional:      General: She is not in acute distress.    Appearance: Normal appearance. She is not ill-appearing.  HENT:     Head: Normocephalic and atraumatic.     Right Ear: Tympanic membrane, ear canal and external ear normal. No tenderness. No middle ear effusion. There is no impacted cerumen. Tympanic membrane is not perforated, erythematous, retracted or bulging.     Left Ear: Tympanic membrane, ear canal and external ear normal. No tenderness.  No middle ear effusion. There is no impacted cerumen. Tympanic membrane is not perforated, erythematous, retracted or bulging.     Nose: Nose normal. No congestion.     Mouth/Throat:     Mouth: Mucous membranes are moist.     Pharynx: Uvula midline. No oropharyngeal exudate or posterior oropharyngeal erythema.  Eyes:     Extraocular Movements: Extraocular movements intact.     Pupils: Pupils are equal, round, and reactive to light.  Cardiovascular:     Rate and Rhythm: Normal rate and regular rhythm.     Heart sounds: Normal heart sounds.  Pulmonary:     Effort: Pulmonary effort is normal.     Breath sounds: Normal breath sounds. No decreased breath sounds, wheezing, rhonchi or rales.  Abdominal:     Palpations: Abdomen is soft.     Tenderness: There is no abdominal tenderness. There is no guarding or rebound.  Neurological:     General: No focal deficit present.     Mental Status: She is alert and oriented to person, place, and time.  Psychiatric:        Mood and Affect: Mood normal.        Behavior: Behavior  normal.        Thought Content: Thought content normal.        Judgment: Judgment normal.     UC Treatments / Results  Labs (all labs ordered are listed, but only abnormal results are displayed) Labs Reviewed  SARS CORONAVIRUS 2 (TAT 6-24 HRS)    EKG   Radiology No results found.  Procedures Procedures (including critical care time)  Medications Ordered in UC Medications - No data to display  Initial Impression / Assessment and Plan / UC Course  I have reviewed the triage vital signs and the nursing notes.  Pertinent labs & imaging results that were available during my care of the patient were reviewed by me and considered in my medical decision making (see chart for details).     This patient is a very pleasant 55 y.o. year old female presenting with covid19 following exposure. Today this pt is afebrile nontachycardic nontachypneic, oxygenating well on room air, no wheezes rhonchi or rales. No history pulm ds. Fully vaccinated and x1 boosted for covid19. Covid PCR sent for confirmation at patient request. Promethazine DM for symptomatic relief.  This patient does take long-term anticoagulation for history CVA- clopidogrel.   ED return precautions discussed. Patient and daughter verbalizes understanding and agreement.   Coding Level 4 for acute illness with systemic symptoms, and prescription drug management   Final Clinical Impressions(s) / UC Diagnoses   Final diagnoses:  Long term current use of anticoagulant therapy  COVID-19  Encounter  for screening for COVID-19     Discharge Instructions      -Promethazine DM cough syrup for congestion/cough. This could make you drowsy, so take at night before bed. -With a virus, you're typically contagious for 5-7 days, or as long as you're having fevers.  -If symptoms get worse, including shortness of breath, chest pain, dizziness, etc.-follow-up with [place] or head to the emergency department.     ED Prescriptions      Medication Sig Dispense Auth. Provider   promethazine-dextromethorphan (PROMETHAZINE-DM) 6.25-15 MG/5ML syrup Take 5 mLs by mouth 4 (four) times daily as needed for cough. 118 mL Hazel Sams, PA-C      PDMP not reviewed this encounter.   Hazel Sams, PA-C 06/11/21 1415

## 2021-06-11 NOTE — Discharge Instructions (Addendum)
-  Promethazine DM cough syrup for congestion/cough. This could make you drowsy, so take at night before bed. -With a virus, you're typically contagious for 5-7 days, or as long as you're having fevers.  -If symptoms get worse, including shortness of breath, chest pain, dizziness, etc.-follow-up with [place] or head to the emergency department.

## 2021-06-12 LAB — SARS CORONAVIRUS 2 (TAT 6-24 HRS): SARS Coronavirus 2: POSITIVE — AB

## 2021-06-14 DIAGNOSIS — U071 COVID-19: Secondary | ICD-10-CM | POA: Diagnosis not present

## 2021-06-15 DIAGNOSIS — N1832 Chronic kidney disease, stage 3b: Secondary | ICD-10-CM | POA: Diagnosis not present

## 2021-06-15 DIAGNOSIS — I129 Hypertensive chronic kidney disease with stage 1 through stage 4 chronic kidney disease, or unspecified chronic kidney disease: Secondary | ICD-10-CM | POA: Diagnosis not present

## 2021-06-15 DIAGNOSIS — E1122 Type 2 diabetes mellitus with diabetic chronic kidney disease: Secondary | ICD-10-CM | POA: Diagnosis not present

## 2021-06-15 DIAGNOSIS — E7849 Other hyperlipidemia: Secondary | ICD-10-CM | POA: Diagnosis not present

## 2021-06-17 DIAGNOSIS — U071 COVID-19: Secondary | ICD-10-CM | POA: Diagnosis not present

## 2021-06-17 DIAGNOSIS — E1165 Type 2 diabetes mellitus with hyperglycemia: Secondary | ICD-10-CM | POA: Diagnosis not present

## 2021-08-11 DIAGNOSIS — I129 Hypertensive chronic kidney disease with stage 1 through stage 4 chronic kidney disease, or unspecified chronic kidney disease: Secondary | ICD-10-CM | POA: Diagnosis not present

## 2021-08-11 DIAGNOSIS — N189 Chronic kidney disease, unspecified: Secondary | ICD-10-CM | POA: Diagnosis not present

## 2021-08-11 DIAGNOSIS — N1832 Chronic kidney disease, stage 3b: Secondary | ICD-10-CM | POA: Diagnosis not present

## 2021-08-11 DIAGNOSIS — E669 Obesity, unspecified: Secondary | ICD-10-CM | POA: Diagnosis not present

## 2021-08-11 DIAGNOSIS — I639 Cerebral infarction, unspecified: Secondary | ICD-10-CM | POA: Diagnosis not present

## 2021-08-11 DIAGNOSIS — E1122 Type 2 diabetes mellitus with diabetic chronic kidney disease: Secondary | ICD-10-CM | POA: Diagnosis not present

## 2021-08-11 DIAGNOSIS — E785 Hyperlipidemia, unspecified: Secondary | ICD-10-CM | POA: Diagnosis not present

## 2021-08-22 DIAGNOSIS — Z0000 Encounter for general adult medical examination without abnormal findings: Secondary | ICD-10-CM | POA: Diagnosis not present

## 2021-08-22 DIAGNOSIS — E1165 Type 2 diabetes mellitus with hyperglycemia: Secondary | ICD-10-CM | POA: Diagnosis not present

## 2021-08-25 DIAGNOSIS — Z1339 Encounter for screening examination for other mental health and behavioral disorders: Secondary | ICD-10-CM | POA: Diagnosis not present

## 2021-08-25 DIAGNOSIS — Z0000 Encounter for general adult medical examination without abnormal findings: Secondary | ICD-10-CM | POA: Diagnosis not present

## 2021-08-25 DIAGNOSIS — Z87448 Personal history of other diseases of urinary system: Secondary | ICD-10-CM | POA: Diagnosis not present

## 2021-08-25 DIAGNOSIS — Z1331 Encounter for screening for depression: Secondary | ICD-10-CM | POA: Diagnosis not present

## 2021-08-25 DIAGNOSIS — Z1211 Encounter for screening for malignant neoplasm of colon: Secondary | ICD-10-CM | POA: Diagnosis not present

## 2021-08-25 DIAGNOSIS — E1165 Type 2 diabetes mellitus with hyperglycemia: Secondary | ICD-10-CM | POA: Diagnosis not present

## 2021-08-25 DIAGNOSIS — E782 Mixed hyperlipidemia: Secondary | ICD-10-CM | POA: Diagnosis not present

## 2021-08-25 DIAGNOSIS — I152 Hypertension secondary to endocrine disorders: Secondary | ICD-10-CM | POA: Diagnosis not present

## 2021-08-30 DIAGNOSIS — E782 Mixed hyperlipidemia: Secondary | ICD-10-CM | POA: Diagnosis not present

## 2021-08-30 DIAGNOSIS — E1143 Type 2 diabetes mellitus with diabetic autonomic (poly)neuropathy: Secondary | ICD-10-CM | POA: Diagnosis not present

## 2021-08-30 DIAGNOSIS — N1832 Chronic kidney disease, stage 3b: Secondary | ICD-10-CM | POA: Diagnosis not present

## 2021-08-30 DIAGNOSIS — E1122 Type 2 diabetes mellitus with diabetic chronic kidney disease: Secondary | ICD-10-CM | POA: Diagnosis not present

## 2021-08-30 DIAGNOSIS — I6930 Unspecified sequelae of cerebral infarction: Secondary | ICD-10-CM | POA: Diagnosis not present

## 2021-08-30 DIAGNOSIS — E1165 Type 2 diabetes mellitus with hyperglycemia: Secondary | ICD-10-CM | POA: Diagnosis not present

## 2021-08-30 DIAGNOSIS — E1121 Type 2 diabetes mellitus with diabetic nephropathy: Secondary | ICD-10-CM | POA: Diagnosis not present

## 2021-08-30 DIAGNOSIS — Z9181 History of falling: Secondary | ICD-10-CM | POA: Diagnosis not present

## 2021-08-30 DIAGNOSIS — I129 Hypertensive chronic kidney disease with stage 1 through stage 4 chronic kidney disease, or unspecified chronic kidney disease: Secondary | ICD-10-CM | POA: Diagnosis not present

## 2021-09-05 DIAGNOSIS — I639 Cerebral infarction, unspecified: Secondary | ICD-10-CM | POA: Diagnosis not present

## 2021-09-05 DIAGNOSIS — R2681 Unsteadiness on feet: Secondary | ICD-10-CM | POA: Diagnosis not present

## 2021-09-05 DIAGNOSIS — M6281 Muscle weakness (generalized): Secondary | ICD-10-CM | POA: Diagnosis not present

## 2021-09-05 DIAGNOSIS — Z9181 History of falling: Secondary | ICD-10-CM | POA: Diagnosis not present

## 2021-09-05 DIAGNOSIS — M21371 Foot drop, right foot: Secondary | ICD-10-CM | POA: Diagnosis not present

## 2021-09-09 DIAGNOSIS — I10 Essential (primary) hypertension: Secondary | ICD-10-CM | POA: Diagnosis not present

## 2021-09-09 DIAGNOSIS — E1165 Type 2 diabetes mellitus with hyperglycemia: Secondary | ICD-10-CM | POA: Diagnosis not present

## 2021-09-14 ENCOUNTER — Encounter: Payer: Self-pay | Admitting: Adult Health

## 2021-09-14 ENCOUNTER — Ambulatory Visit: Payer: Medicare HMO | Admitting: Adult Health

## 2021-09-14 ENCOUNTER — Other Ambulatory Visit: Payer: Self-pay

## 2021-09-14 VITALS — BP 130/67 | HR 49 | Ht 62.0 in | Wt 232.0 lb

## 2021-09-14 DIAGNOSIS — Z8673 Personal history of transient ischemic attack (TIA), and cerebral infarction without residual deficits: Secondary | ICD-10-CM

## 2021-09-14 DIAGNOSIS — H53461 Homonymous bilateral field defects, right side: Secondary | ICD-10-CM

## 2021-09-14 DIAGNOSIS — G3184 Mild cognitive impairment, so stated: Secondary | ICD-10-CM | POA: Diagnosis not present

## 2021-09-14 DIAGNOSIS — R208 Other disturbances of skin sensation: Secondary | ICD-10-CM | POA: Diagnosis not present

## 2021-09-14 NOTE — Patient Instructions (Addendum)
Continue clopidogrel 75 mg daily  and atorvastatin  for secondary stroke prevention  Continue to follow up with PCP regarding cholesterol, diabetes and blood pressure management  Maintain strict control of hypertension with blood pressure goal below 130/90, diabetes with hemoglobin A1c goal below 7.0% and cholesterol with LDL cholesterol (bad cholesterol) goal below 70 mg/dL.   Unknown reason behind generalized decreased sensitivity - low suspicion stroke related as it is generalized and no sensory issues or numbness/tingling - would recommend follow up with PCP if this continues  You will be called to set up repeat imaging of your brain to rule out new stroke due to concern of vision loss of right periphery in both eyes      Followup in the future with me in 6 months or call earlier if needed       Thank you for coming to see [place] at [facility]. I hope we have been able to provide you high quality care today.  You may receive a patient satisfaction survey over the next few weeks. We would appreciate your feedback and comments so that we may continue to improve ourselves and the health of our patients.

## 2021-09-14 NOTE — Progress Notes (Deleted)
[facility] [address] [place]. [place] [address] [phone]       OFFICE FOLLOW UP NOTE  [name] Date of Birth:  12/07/1965 Medical Record Number:  [id number]   Referring MD: Lucianne Lei  Reason for Referral: Memory loss  Chief Complaint  Patient presents with   Follow-up    Rm  3 with daughter Annette Munoz  Pt is well, stable form stroke stand point. No new concerns  Daughter states her cognition is stable, nothing has worsen of improved much. She is just very sensitive to touch        HPI:   Update 09/14/2021 JM: Returns for 2-monthstroke follow-up  Overall stable from stroke standpoint -no new stroke/TIA symptoms Cognitive impairment ***.  MMSE today ***/30 (prior 25/30) Remains on Aricept 10 mg nightly -denies side effects  Compliant on Plavix and atorvastatin -denies side effects Blood pressure today ***       History provided for reference purposes only Update 03/15/2021 JM: Ms. YRissmillerreturns for stroke follow-up after prior initial consult visit with Dr. SLeonie Man3 months ago.  She is accompanied by her daughter.  Stable since prior visit without new stroke/TIA symptoms.  Residual cognitive impairment has been stable.  She is able to maintain all ADLs independently and some IADLs with family assistance as needed.  She does not drive.  Reversible causes of memory loss evaluated after prior visit which was all satisfactory except elevated homocystine level and recommend initiating folic acid as well as tobacco cessation.  Reports compliance with folic acid as well as Aricept tolerating without side effects.  Compliant on Plavix and atorvastatin 80 mg daily without associated side effects Blood pressure today 138/81 Glucose levels not routinely monitored Routinely follows with Dr. BCriss Rosalesfor HTN, HLD and DM management  No new concerns at this time  Consult visit 11/23/2020 Dr. SLeonie Man AnnetteMunoz is a pleasant 55-yearAfrican-American  lady seen today for office consultation visit for memory loss following stroke. History is obtained from the patient and her daughters accompanying her as well as review of electronic medical records and reviewed pertinent imaging films in PACS. She has past medical history of diabetes, hypertension, obesity, hyperlipidemia who has history of stroke [facility] and MRI showed multiple small acute left anterior cerebral artery infarct involving corpus callosum, cingulate gyrus and superior left frontal lobe as well as small infarct involving anterior inferior right thalamus//cerebral peduncle. 2D echo and carotid ultrasound were unremarkable LDL cholesterol 138 and A1c was 7.2. She was subsequently readmitted on 05/18/2016 with new acute infarct involving medial right hemisphere and anteriormost thalamus and genu of internal capsule. TEE at this time was negative for PFO or left atrial thrombus. Transcranial Doppler monitoring was suboptimal and no microembolic signals were noted. 30-day cardiac monitoring was negative for A. fib. She was placed on dual antiplatelet therapy and risk recommended aggressive risk factor modification. She will last seen in the office in clinic by Dr. XErlinda Hongin December 2018 and then lost to follow-up. The patient and daughter state that she has had persistent memory difficulties since her strokes which have been not getting any better. This is mostly short-term memory and otherwise she is quite independent in most of her activities of daily living. She has not had any significant behavioral changes to the daughter feels she may be depressed as she no longer goes out as much participate  in other activities outside the house. She has been pretty compliant with her stroke prevention regimen. She has stopped aspirin and is currently on Plavix alone. Blood pressure is well controlled today it is  135/76. Last hemoglobin A1c was 6. Lipid profile was also satisfactory. She has been started on Aricept for her memory loss and has been taking 10 mg daily and tolerating it well without side effects. She does do solitaire religiously every day but does not do other activities which are cognitively challenging. She has not had any recent lab work or brain imaging studies done. She has had no recent recurrent stroke or TIA symptoms either.    ROS:   14 system review of systems is positive for those listed in HPI and all other systems negative    PMH:  Past Medical History:  Diagnosis Date   Anemia    Arthritis    Diabetes mellitus without complication ([place])    Hypertension    Stroke [facility])     Social History:  Social History   Socioeconomic History   Marital status: Married    Spouse name: Not on file   Number of children: Not on file   Years of education: Not on file   Highest education level: Not on file  Occupational History   Occupation: disabilaity  Tobacco Use   Smoking status: Never   Smokeless tobacco: Never  Vaping Use   Vaping Use: Never used  Substance and Sexual Activity   Alcohol use: No    Alcohol/week: 1.0 standard drink    Types: 1 Shots of liquor per week    Comment: occasionally   Drug use: No   Sexual activity: Yes    Partners: Male  Other Topics Concern   Not on file  Social History Narrative   Lives with husband and patient;s brother   Right Handed   Driinks no caffeine   Social Determinants of Health   Financial Resource Strain: Not on file  Food Insecurity: Not on file  Transportation Needs: Not on file  Physical Activity: Not on file  Stress: Not on file  Social Connections: Not on file  Intimate Partner Violence: Not on file    Medications:   Current Outpatient Medications on File Prior to Visit  Medication Sig Dispense Refill   Alcohol Swabs (ALCOHOL PREP) PADS Use to check blood sugar daily. E11.65 100 each 3   amLODipine  (NORVASC) 10 MG tablet Take 1 tablet (10 mg total) by mouth daily. (Patient taking differently: Take 5 mg by mouth daily.) 30 tablet 5   atorvastatin (LIPITOR) 80 MG tablet TAKE 1 TABLET (80 MG TOTAL) BY MOUTH DAILY AT 6 PM. 90 tablet 1   blood glucose meter kit and supplies KIT Use daily to check blood sugar. E11.65 1 each 0   Blood Pressure Monitoring (PREMIUM AUTOMATIC BP MONITOR) DEVI 1 Device by Does not apply route daily. 1 Device 0   citalopram (CELEXA) 20 MG tablet      clopidogrel (PLAVIX) 75 MG tablet Take 1 tablet (75 mg total) by mouth daily. 30 tablet 0   donepezil (ARICEPT) 10 MG tablet Take 1 tablet (10 mg total) by mouth at bedtime. 90 tablet 3   folic acid (FOLVITE) 1 MG tablet Take 1 tablet (1 mg total) by mouth daily. 90 tablet 3   glucose blood test strip Use to check blood sugar daily. E11.65 100 each 3   GLYXAMBI 10-5 MG TABS      Iron-FA-B Cmp-C-Biot-Probiotic (  FUSION PLUS) CAPS      Lancet Devices (LANCING DEVICE) MISC Use daily to check blood sugar. E11.65 1 each 0   Lancets MISC Use to check blood sugar daily. E11.65 100 each 3   megestrol (MEGACE) 40 MG tablet Take 1 tablet (40 mg total) by mouth daily. Can increase to twice a day in the event of heavy bleeding 60 tablet 5   valsartan-hydrochlorothiazide (DIOVAN-HCT) 320-12.5 MG tablet Take 1 tablet by mouth daily.      No current facility-administered medications on file prior to visit.    Allergies:   Allergies  Allergen Reactions   Lisinopril Cough    Physical Exam Today's Vitals   09/14/21 0822  BP: 130/67  Pulse: (!) 49  Weight: 232 lb (105.2 kg)  Height: 5' 2"  (1.575 m)    Body mass index is 42.43 kg/m.  General: Obese middle-aged African-American lady seated, in no evident distress Head: head normocephalic and atraumatic.   Neck: supple with no carotid or supraclavicular bruits Cardiovascular: regular rate and rhythm, no murmurs Musculoskeletal: no deformity Skin:  no  rash/petichiae Vascular:  Normal pulses all extremities  Neurologic Exam Mental Status: Awake and fully alert.  Fluent speech and language.  Oriented to place and time. Recent memory impaired and remote memory intact. Attention span, concentration and fund of knowledge slightly diminished with daughter providing majority of history. Mood and affect appropriate. MMSE not completed today MMSE - Mini Mental State Exam 09/14/2021 11/23/2020 05/22/2016  Orientation to time 2 4 4   Orientation to Place 5 5 5   Registration 3 3 3   Attention/ Calculation 4 5 3   Recall 1 0 0  Language- name 2 objects 2 2 2   Language- repeat 1 1 1   Language- follow 3 step command 3 3 3   Language- read & follow direction 1 1 1   Write a sentence 1 1 1   Copy design 1 0 0  Total score 24 25 23     Cranial Nerves: Pupils equal, briskly reactive to light. Extraocular movements full without nystagmus. Visual fields possible OD temporal visual field decreased vision but difficulty fully testing due to difficulty understanding directions. Hearing intact. Facial sensation intact. Mild right lower face asymmetry. Tongue, palate moves normally and symmetrically.  Motor: Normal bulk and tone. Normal strength in all tested extremity muscles except diminished fine finger movements on the right. Orbits left over right upper extremity. Sensory.: intact to touch , pinprick , position and vibratory sensation.  Coordination: Rapid alternating movements normal in all extremities except slightly decreased right hand. Finger-to-nose and heel-to-shin performed accurately bilaterally. Gait and Station: Arises from chair without difficulty. Stance is normal. Gait demonstrates decreased stride length and step height of right leg without use of assistive device.  Unable to complete tandem walk and heel toe reflexes: 1+ and asymmetric and brisker on the right compared to the left.. Toes downgoing.       ASSESSMENT: 55 year old African-American  lady with a remote history of multiple bilateral mostly subcortical strokes with residual mild cognitive impairment and memory loss post stroke. Vascular risk factors of hypertension, diabetes, hyperlipidemia ,obesity and intracranial stenosis     PLAN:  Remote multiple strokes -post stroke mild cognitive impairment and mild right hemiparesis with gait impairment -Continue Plavix for secondary stroke prevention and  -Extensive discussion and education regarding multiple stroke risk factors and risk of recurrent strokes and importance of close PCP follow-up for aggressive stroke risk factor management to maintain strict control of hypertension with blood pressure  goal below 130/90, diabetes with hemoglobin A1c goal below 7 % and lipids with LDL cholesterol goal below 70 mg/dL.  MCI -Stable since prior visit -Continue Aricept 34m nightly -refill provided -increase participation in cognitively challenging activities like solving crossword puzzles, playing bridge and sodoku.  We also discussed memory compensation strategies.   -Dementia panel showed elevated homocystine level - continue folic acid and repeat levels today as well as B6  Visual impairment -denies any recent visual changes - per daughter, longstanding history of visual impairment -has f/u with ophthalmology in the next couple of weeks for routine yearly follow-up -Questionable OD temporal field visual difficulties but difficulty fully evaluating due to patient having difficulty understanding directions - request visual field test with ophthalmology at follow-up visit   Follow-up in 6 months or call earlier if needed   CC:  GWanshipprovider: Dr. STrula Slade MD   I spent *** minutes of face-to-face and non-face-to-face time with patient.  This included previsit chart review, lab review, study review, order entry, electronic health record documentation, patient education and discussion regarding history of prior strokes,  secondary stroke prevention measures and aggressive stroke risk factor management, MCI with completion review of MMSE and answered all other questions to patient satisfaction    JFrann Rider AFredericksburg Ambulatory [facility] GG[facility] LLC[facility] [address] [address] Phone [phone] [phone]: This document was prepared with digital dictation and possible smart phrase technology. Any transcriptional errors that result from this process are unintentional.

## 2021-09-14 NOTE — Progress Notes (Signed)
[facility] [address] [place]. [place] [address] [phone]       OFFICE FOLLOW UP NOTE  [name] Date of Birth:  [birth year] Medical Record Number:  [id number]   Referring MD: Annette Munoz  Reason for Referral: Memory loss  Chief Complaint  Patient presents with   Follow-up    Rm  3 with daughter Annette Munoz  Pt is well, stable form stroke stand point. No new concerns  Daughter states her cognition is stable, nothing has worsen of improved much. She is just very sensitive to touch        HPI:   Update 09/14/2021 Annette Munoz: Returns for 54-monthstroke follow-up with her daughter.  Overall stable from stroke standpoint -no new stroke/TIA symptoms Cognitive impairment stable. MMSE today 24/30 (prior 25/30) Remains on Aricept 10 mg nightly -denies side effects.   Compliant on Plavix and atorvastatin -denies side effects Blood pressure today 130/67, HR 49, baseline per daughter and asymptomatic.  Daughter mentions generalized touch sensitivity, specifically grimacing more when getting her hair done and sensitivity to cold. Not localized to one specific area or on one side. Chronic issue - daughter unsure if worsening or just noticing more.  Patient denies any associated symptoms such as numbness/tingling or loss of sensation.   Of note, COVID + in 05/2021 - has since recovered well  No further concerns at this time   History provided for reference purposes only Update 03/15/2021 Annette Munoz: [name] for stroke follow-up after prior initial consult visit with Dr. SLeonie Man3 months ago.  She is accompanied by her daughter.  Stable since prior visit without new stroke/TIA symptoms.  Residual cognitive impairment has been stable.  She is able to maintain all ADLs independently and some IADLs with family assistance as needed.  She does not drive.  Reversible causes of memory loss evaluated after prior visit which was all satisfactory except elevated homocystine  level and recommend initiating folic acid as well as tobacco cessation.  Reports compliance with folic acid as well as Aricept tolerating without side effects.  Compliant on Plavix and atorvastatin 80 mg daily without associated side effects Blood pressure today 138/81 Glucose levels not routinely monitored Routinely follows with Dr. BCriss Rosalesfor HTN, HLD and DM management  No new concerns at this time  Consult visit 11/23/2020 Dr. SLeonie Man AnnetteMunoz is a pleasant 55-yearAfrican-American lady seen today for office consultation visit for memory loss following stroke. History is obtained from the patient and her daughters accompanying her as well as review of electronic medical records and reviewed pertinent imaging films in PACS. She has past medical history of diabetes, hypertension, obesity, hyperlipidemia who has history of stroke since [facility] and MRI showed multiple small acute left anterior cerebral artery infarct involving corpus callosum, cingulate gyrus and superior left frontal lobe as well as small infarct involving anterior inferior right thalamus//cerebral peduncle. 2D echo and carotid ultrasound were unremarkable LDL cholesterol 138 and A1c was 7.2. She was subsequently readmitted on 05/18/2016 with new acute infarct involving medial right hemisphere and anteriormost thalamus and genu of internal capsule. TEE at this time was negative for PFO or left atrial thrombus. Transcranial Doppler monitoring was suboptimal and no microembolic signals were noted. 30-day cardiac monitoring was negative for A. fib. She was placed on dual antiplatelet therapy and risk recommended aggressive risk factor modification. She will last seen in the office in  clinic by Dr. Erlinda Munoz in December 2018 and then lost to follow-up. The patient and daughter state that she has had persistent memory difficulties since her strokes which have  been not getting any better. This is mostly short-term memory and otherwise she is quite independent in most of her activities of daily living. She has not had any significant behavioral changes to the daughter feels she may be depressed as she no longer goes out as much participate in other activities outside the house. She has been pretty compliant with her stroke prevention regimen. She has stopped aspirin and is currently on Plavix alone. Blood pressure is well controlled today it is 135/76. Last hemoglobin A1c was 6. Lipid profile was also satisfactory. She has been started on Aricept for her memory loss and has been taking 10 mg daily and tolerating it well without side effects. She does do solitaire religiously every day but does not do other activities which are cognitively challenging. She has not had any recent lab work or brain imaging studies done. She has had no recent recurrent stroke or TIA symptoms either.    ROS:   14 system review of systems is positive for those listed in HPI and all other systems negative    PMH:  Past Medical History:  Diagnosis Date   Anemia    Arthritis    Diabetes mellitus without complication ([place])    Hypertension    Stroke [facility])     Social History:  Social History   Socioeconomic History   Marital status: Married    Spouse name: Not on file   Number of children: Not on file   Years of education: Not on file   Highest education level: Not on file  Occupational History   Occupation: disabilaity  Tobacco Use   Smoking status: Never   Smokeless tobacco: Never  Vaping Use   Vaping Use: Never used  Substance and Sexual Activity   Alcohol use: No    Alcohol/week: 1.0 standard drink    Types: 1 Shots of liquor per week    Comment: occasionally   Drug use: No   Sexual activity: Yes    Partners: Male  Other Topics Concern   Not on file  Social History Narrative   Lives with husband and patient;s brother   Right Handed   Driinks no  caffeine   Social Determinants of Health   Financial Resource Strain: Not on file  Food Insecurity: Not on file  Transportation Needs: Not on file  Physical Activity: Not on file  Stress: Not on file  Social Connections: Not on file  Intimate Partner Violence: Not on file    Medications:   Current Outpatient Medications on File Prior to Visit  Medication Sig Dispense Refill   Alcohol Swabs (ALCOHOL PREP) PADS Use to check blood sugar daily. E11.65 100 each 3   amLODipine (NORVASC) 10 MG tablet Take 1 tablet (10 mg total) by mouth daily. (Patient taking differently: Take 5 mg by mouth daily.) 30 tablet 5   atorvastatin (LIPITOR) 80 MG tablet TAKE 1 TABLET (80 MG TOTAL) BY MOUTH DAILY AT 6 PM. 90 tablet 1   blood glucose meter kit and supplies KIT Use daily to check blood sugar. E11.65 1 each 0   Blood Pressure Monitoring (PREMIUM AUTOMATIC BP MONITOR) DEVI 1 Device by Does not apply route daily. 1 Device 0   citalopram (CELEXA) 20 MG tablet      clopidogrel (PLAVIX) 75 MG tablet Take 1 tablet (  75 mg total) by mouth daily. 30 tablet 0   donepezil (ARICEPT) 10 MG tablet Take 1 tablet (10 mg total) by mouth at bedtime. 90 tablet 3   folic acid (FOLVITE) 1 MG tablet Take 1 tablet (1 mg total) by mouth daily. 90 tablet 3   glucose blood test strip Use to check blood sugar daily. E11.65 100 each 3   GLYXAMBI 10-5 MG TABS      Iron-FA-B Cmp-C-Biot-Probiotic (FUSION PLUS) CAPS      Lancet Devices (LANCING DEVICE) MISC Use daily to check blood sugar. E11.65 1 each 0   Lancets MISC Use to check blood sugar daily. E11.65 100 each 3   megestrol (MEGACE) 40 MG tablet Take 1 tablet (40 mg total) by mouth daily. Can increase to twice a day in the event of heavy bleeding 60 tablet 5   valsartan-hydrochlorothiazide (DIOVAN-HCT) 320-12.5 MG tablet Take 1 tablet by mouth daily.      No current facility-administered medications on file prior to visit.    Allergies:   Allergies  Allergen Reactions    Lisinopril Cough    Physical Exam Today's Vitals   09/14/21 0822  BP: 130/67  Pulse: (!) 49  Weight: 232 lb (105.2 kg)  Height: _0  (1.575 m)    Body mass index is 42.43 kg/m.  General: Obese middle-aged African-American lady seated, in no evident distress Head: head normocephalic and atraumatic.   Neck: supple with no carotid or supraclavicular bruits Cardiovascular: regular rate and rhythm, no murmurs Musculoskeletal: no deformity Skin:  no rash/petichiae Vascular:  Normal pulses all extremities  Neurologic Exam Mental Status: Awake and fully alert.  Fluent speech and language.  Oriented to place and time. Recent memory impaired and remote memory intact. Attention span, concentration and fund of knowledge slightly diminished with daughter providing majority of history. Mood and affect appropriate. MMSE - Mini Mental State Exam 09/14/2021 11/23/2020 05/22/2016  Orientation to time _1 Orientation to Place _2 Registration _3 Attention/ Calculation _4 Recall 1 0 0  Language- name 2 objects _5 Language- repeat _6 Language- follow 3 step command _7 Language- read & follow direction _8 Write a sentence _9 Copy design 1 0 0  Total score _10 Cranial Nerves: Pupils equal, briskly reactive to light. Extraocular movements full without nystagmus.  Visual fields show right homonymous hemianopsia (not previously observed although did have difficulty following direction - questioned OD temporal visual impairment prior). Hearing intact. Facial sensation intact. Motor: Normal bulk and tone. Normal strength in all tested extremity muscles except diminished fine finger movements on the right. Orbits left over right upper extremity. Sensory: intact to touch, pinprick, position and vibratory sensation.  Coordination: Rapid alternating movements normal in all extremities except slightly decreased right hand. Finger-to-nose and heel-to-shin performed  accurately bilaterally. Gait and Station: Arises from chair slowly. Stance is normal. Gait demonstrates decreased stride length and step height of right leg with use of cane.  Unable to complete tandem walk and heel toe.  Reflexes: 1+ and asymmetric and brisker on the right compared to the left. Toes downgoing.      ASSESSMENT: 55 year old African-American lady with a remote history of multiple bilateral mostly subcortical strokes with residual mild cognitive impairment and memory loss post stroke. Vascular risk factors of hypertension, diabetes, hyperlipidemia ,obesity and intracranial stenosis and COVID +  05/2021     PLAN:  Right hemianopia -not previously appreciated on exam although prior difficulty fully assessing vision with possible OD temporal visual impairment but not evidence of visual loss in OS -she is unable to appreciate these symptoms and subjectively denies any recent visual changes - does have follow up with ophthalmology 12/13 -obtain MR brain wo contrast to rule out new stroke   Remote multiple strokes -post stroke mild cognitive impairment and mild right hemiparesis with gait impairment -Continue Plavix and atorvastatin 80 mg for secondary stroke prevention  -Extensive discussion and education regarding multiple stroke risk factors and risk of recurrent strokes and importance of close PCP follow-up for aggressive stroke risk factor management to maintain strict control of hypertension with blood pressure goal below 130/90, diabetes with hemoglobin A1c goal below 7 % and lipids with LDL cholesterol goal below 70 mg/dL -stroke labs 08/22/2021: A1c 6.9, LDL 97 - discussed importance of diet modification - if LDL remains above 70 after repeat testing, would recommend changing statin to Crestor 62m daily for better control    MCI -Stable since prior visit - MMSE today 24/30 (prior 25/30) -Continue Aricept 169mnightly -refill provided -increase participation in  cognitively challenging activities like solving crossword puzzles, playing bridge and sodoku.  We also discussed memory compensation strategies.     Touch sensitivity, generalized, subjective -unknown etiology - low suspicion stroke related as non focal symptoms -no evidence of pain with touch on exam, no evidence of sensory loss, denies numbness/tingling -advised to further discuss with PCP for further evaluation if indicated      Follow-up in 6 months or call earlier if needed   CC:  BlLucianne LeiMD   I spent 38 minutes of face-to-face and non-face-to-face time with patient and daughter.  This included previsit chart review, lab review, study review, order entry, electronic health record documentation, patient education and discussion regarding history of prior strokes, secondary stroke prevention measures and aggressive stroke risk factor management, MCI with completion review of MMSE, touch sensitivity and possible causes and answered all other questions to patient satisfaction    JeFrann RiderAGNP-BC  [facility] [address] StreetuShreverTodd MissionNC [address]Phone [phone]: This document was prepared with digital dictation and possible smart phrase technology. Any transcriptional errors that result from this process are unintentional.

## 2021-09-16 DIAGNOSIS — N1832 Chronic kidney disease, stage 3b: Secondary | ICD-10-CM | POA: Diagnosis not present

## 2021-09-20 DIAGNOSIS — M6281 Muscle weakness (generalized): Secondary | ICD-10-CM | POA: Diagnosis not present

## 2021-09-20 DIAGNOSIS — M21371 Foot drop, right foot: Secondary | ICD-10-CM | POA: Diagnosis not present

## 2021-09-20 DIAGNOSIS — R2681 Unsteadiness on feet: Secondary | ICD-10-CM | POA: Diagnosis not present

## 2021-09-20 DIAGNOSIS — I639 Cerebral infarction, unspecified: Secondary | ICD-10-CM | POA: Diagnosis not present

## 2021-09-20 DIAGNOSIS — Z9181 History of falling: Secondary | ICD-10-CM | POA: Diagnosis not present

## 2021-09-22 DIAGNOSIS — I639 Cerebral infarction, unspecified: Secondary | ICD-10-CM | POA: Diagnosis not present

## 2021-09-22 DIAGNOSIS — R2681 Unsteadiness on feet: Secondary | ICD-10-CM | POA: Diagnosis not present

## 2021-09-22 DIAGNOSIS — M21371 Foot drop, right foot: Secondary | ICD-10-CM | POA: Diagnosis not present

## 2021-09-22 DIAGNOSIS — Z9181 History of falling: Secondary | ICD-10-CM | POA: Diagnosis not present

## 2021-09-22 DIAGNOSIS — M6281 Muscle weakness (generalized): Secondary | ICD-10-CM | POA: Diagnosis not present

## 2021-09-27 DIAGNOSIS — I10 Essential (primary) hypertension: Secondary | ICD-10-CM | POA: Diagnosis not present

## 2021-09-27 DIAGNOSIS — E785 Hyperlipidemia, unspecified: Secondary | ICD-10-CM | POA: Diagnosis not present

## 2021-09-27 DIAGNOSIS — E1122 Type 2 diabetes mellitus with diabetic chronic kidney disease: Secondary | ICD-10-CM | POA: Diagnosis not present

## 2021-09-27 DIAGNOSIS — D638 Anemia in other chronic diseases classified elsewhere: Secondary | ICD-10-CM | POA: Diagnosis not present

## 2021-09-27 DIAGNOSIS — Z1211 Encounter for screening for malignant neoplasm of colon: Secondary | ICD-10-CM | POA: Diagnosis not present

## 2021-09-28 DIAGNOSIS — M6281 Muscle weakness (generalized): Secondary | ICD-10-CM | POA: Diagnosis not present

## 2021-09-28 DIAGNOSIS — R2681 Unsteadiness on feet: Secondary | ICD-10-CM | POA: Diagnosis not present

## 2021-09-28 DIAGNOSIS — I639 Cerebral infarction, unspecified: Secondary | ICD-10-CM | POA: Diagnosis not present

## 2021-09-28 DIAGNOSIS — M21371 Foot drop, right foot: Secondary | ICD-10-CM | POA: Diagnosis not present

## 2021-09-28 DIAGNOSIS — Z9181 History of falling: Secondary | ICD-10-CM | POA: Diagnosis not present

## 2021-09-29 ENCOUNTER — Ambulatory Visit
Admission: RE | Admit: 2021-09-29 | Discharge: 2021-09-29 | Disposition: A | Discharge status: Home / Self Care | Payer: Medicare HMO | Source: Ambulatory Visit | Attending: Adult Health | Admitting: Adult Health

## 2021-09-29 ENCOUNTER — Other Ambulatory Visit: Payer: Self-pay

## 2021-09-29 DIAGNOSIS — Z8673 Personal history of transient ischemic attack (TIA), and cerebral infarction without residual deficits: Secondary | ICD-10-CM | POA: Diagnosis not present

## 2021-10-03 ENCOUNTER — Telehealth: Payer: Self-pay

## 2021-10-03 NOTE — Telephone Encounter (Signed)
-----   Message from Ihor Austin, NP sent at 10/03/2021  8:53 AM EST ----- Daughter requested call for results - recent MR brain did not show any new or abnormal findings compared to prior imaging. No further evaluation needed from our standpoint at this time.

## 2021-10-03 NOTE — Telephone Encounter (Signed)
Contacted daughter, informed her recent MR brain did not show any new or abnormal findings compared to prior imaging. No further evaluation needed from our standpoint at this time. Advised to call the office with questions as she had none at the time and was appreciative.

## 2021-10-04 DIAGNOSIS — Z9181 History of falling: Secondary | ICD-10-CM | POA: Diagnosis not present

## 2021-10-04 DIAGNOSIS — M21371 Foot drop, right foot: Secondary | ICD-10-CM | POA: Diagnosis not present

## 2021-10-04 DIAGNOSIS — I639 Cerebral infarction, unspecified: Secondary | ICD-10-CM | POA: Diagnosis not present

## 2021-10-04 DIAGNOSIS — M6281 Muscle weakness (generalized): Secondary | ICD-10-CM | POA: Diagnosis not present

## 2021-10-04 DIAGNOSIS — R2681 Unsteadiness on feet: Secondary | ICD-10-CM | POA: Diagnosis not present

## 2021-10-05 ENCOUNTER — Other Ambulatory Visit: Payer: Medicare HMO

## 2021-10-05 DIAGNOSIS — I10 Essential (primary) hypertension: Secondary | ICD-10-CM | POA: Diagnosis not present

## 2021-10-06 DIAGNOSIS — R2681 Unsteadiness on feet: Secondary | ICD-10-CM | POA: Diagnosis not present

## 2021-10-06 DIAGNOSIS — I639 Cerebral infarction, unspecified: Secondary | ICD-10-CM | POA: Diagnosis not present

## 2021-10-06 DIAGNOSIS — Z9181 History of falling: Secondary | ICD-10-CM | POA: Diagnosis not present

## 2021-10-06 DIAGNOSIS — M21371 Foot drop, right foot: Secondary | ICD-10-CM | POA: Diagnosis not present

## 2021-10-06 DIAGNOSIS — M6281 Muscle weakness (generalized): Secondary | ICD-10-CM | POA: Diagnosis not present

## 2021-10-11 DIAGNOSIS — M6281 Muscle weakness (generalized): Secondary | ICD-10-CM | POA: Diagnosis not present

## 2021-10-11 DIAGNOSIS — I639 Cerebral infarction, unspecified: Secondary | ICD-10-CM | POA: Diagnosis not present

## 2021-10-11 DIAGNOSIS — Z9181 History of falling: Secondary | ICD-10-CM | POA: Diagnosis not present

## 2021-10-11 DIAGNOSIS — R2681 Unsteadiness on feet: Secondary | ICD-10-CM | POA: Diagnosis not present

## 2021-10-11 DIAGNOSIS — M21371 Foot drop, right foot: Secondary | ICD-10-CM | POA: Diagnosis not present

## 2021-10-13 DIAGNOSIS — I639 Cerebral infarction, unspecified: Secondary | ICD-10-CM | POA: Diagnosis not present

## 2021-10-13 DIAGNOSIS — Z9181 History of falling: Secondary | ICD-10-CM | POA: Diagnosis not present

## 2021-10-13 DIAGNOSIS — M21371 Foot drop, right foot: Secondary | ICD-10-CM | POA: Diagnosis not present

## 2021-10-13 DIAGNOSIS — M6281 Muscle weakness (generalized): Secondary | ICD-10-CM | POA: Diagnosis not present

## 2021-10-13 DIAGNOSIS — R2681 Unsteadiness on feet: Secondary | ICD-10-CM | POA: Diagnosis not present

## 2021-11-07 DIAGNOSIS — R350 Frequency of micturition: Secondary | ICD-10-CM | POA: Diagnosis not present

## 2021-11-08 DIAGNOSIS — N189 Chronic kidney disease, unspecified: Secondary | ICD-10-CM | POA: Diagnosis not present

## 2021-11-08 DIAGNOSIS — R350 Frequency of micturition: Secondary | ICD-10-CM | POA: Diagnosis not present

## 2021-11-08 DIAGNOSIS — N39498 Other specified urinary incontinence: Secondary | ICD-10-CM | POA: Diagnosis not present

## 2021-11-11 DIAGNOSIS — R350 Frequency of micturition: Secondary | ICD-10-CM | POA: Diagnosis not present

## 2021-11-11 DIAGNOSIS — G629 Polyneuropathy, unspecified: Secondary | ICD-10-CM | POA: Diagnosis not present

## 2021-11-29 DIAGNOSIS — H52203 Unspecified astigmatism, bilateral: Secondary | ICD-10-CM | POA: Diagnosis not present

## 2021-11-29 DIAGNOSIS — E119 Type 2 diabetes mellitus without complications: Secondary | ICD-10-CM | POA: Diagnosis not present

## 2021-11-29 DIAGNOSIS — H5340 Unspecified visual field defects: Secondary | ICD-10-CM | POA: Diagnosis not present

## 2021-11-29 DIAGNOSIS — H2513 Age-related nuclear cataract, bilateral: Secondary | ICD-10-CM | POA: Diagnosis not present

## 2021-11-30 DIAGNOSIS — E782 Mixed hyperlipidemia: Secondary | ICD-10-CM | POA: Diagnosis not present

## 2021-11-30 DIAGNOSIS — E1169 Type 2 diabetes mellitus with other specified complication: Secondary | ICD-10-CM | POA: Diagnosis not present

## 2021-11-30 DIAGNOSIS — E1165 Type 2 diabetes mellitus with hyperglycemia: Secondary | ICD-10-CM | POA: Diagnosis not present

## 2021-12-05 DIAGNOSIS — E785 Hyperlipidemia, unspecified: Secondary | ICD-10-CM | POA: Diagnosis not present

## 2021-12-05 DIAGNOSIS — I129 Hypertensive chronic kidney disease with stage 1 through stage 4 chronic kidney disease, or unspecified chronic kidney disease: Secondary | ICD-10-CM | POA: Diagnosis not present

## 2021-12-05 DIAGNOSIS — E1143 Type 2 diabetes mellitus with diabetic autonomic (poly)neuropathy: Secondary | ICD-10-CM | POA: Diagnosis not present

## 2021-12-05 DIAGNOSIS — E1165 Type 2 diabetes mellitus with hyperglycemia: Secondary | ICD-10-CM | POA: Diagnosis not present

## 2021-12-05 DIAGNOSIS — I10 Essential (primary) hypertension: Secondary | ICD-10-CM | POA: Diagnosis not present

## 2021-12-05 DIAGNOSIS — E1122 Type 2 diabetes mellitus with diabetic chronic kidney disease: Secondary | ICD-10-CM | POA: Diagnosis not present

## 2021-12-05 DIAGNOSIS — E1121 Type 2 diabetes mellitus with diabetic nephropathy: Secondary | ICD-10-CM | POA: Diagnosis not present

## 2021-12-13 ENCOUNTER — Other Ambulatory Visit: Payer: Self-pay

## 2021-12-13 MED ORDER — FOLIC ACID 1 MG PO TABS: 1.0000 mg | ORAL_TABLET | Freq: Every day | ORAL | 3 refills | Status: DC

## 2022-01-02 DIAGNOSIS — H5340 Unspecified visual field defects: Secondary | ICD-10-CM | POA: Diagnosis not present

## 2022-01-02 DIAGNOSIS — H25813 Combined forms of age-related cataract, bilateral: Secondary | ICD-10-CM | POA: Diagnosis not present

## 2022-01-16 DIAGNOSIS — E1165 Type 2 diabetes mellitus with hyperglycemia: Secondary | ICD-10-CM | POA: Diagnosis not present

## 2022-01-16 DIAGNOSIS — I152 Hypertension secondary to endocrine disorders: Secondary | ICD-10-CM | POA: Diagnosis not present

## 2022-01-16 DIAGNOSIS — E1159 Type 2 diabetes mellitus with other circulatory complications: Secondary | ICD-10-CM | POA: Diagnosis not present

## 2022-01-16 DIAGNOSIS — I10 Essential (primary) hypertension: Secondary | ICD-10-CM | POA: Diagnosis not present

## 2022-01-16 DIAGNOSIS — E1121 Type 2 diabetes mellitus with diabetic nephropathy: Secondary | ICD-10-CM | POA: Diagnosis not present

## 2022-01-26 DIAGNOSIS — N1832 Chronic kidney disease, stage 3b: Secondary | ICD-10-CM | POA: Diagnosis not present

## 2022-01-31 DIAGNOSIS — I679 Cerebrovascular disease, unspecified: Secondary | ICD-10-CM | POA: Diagnosis not present

## 2022-01-31 DIAGNOSIS — E1122 Type 2 diabetes mellitus with diabetic chronic kidney disease: Secondary | ICD-10-CM | POA: Diagnosis not present

## 2022-01-31 DIAGNOSIS — R413 Other amnesia: Secondary | ICD-10-CM | POA: Diagnosis not present

## 2022-01-31 DIAGNOSIS — I129 Hypertensive chronic kidney disease with stage 1 through stage 4 chronic kidney disease, or unspecified chronic kidney disease: Secondary | ICD-10-CM | POA: Diagnosis not present

## 2022-01-31 DIAGNOSIS — N1832 Chronic kidney disease, stage 3b: Secondary | ICD-10-CM | POA: Diagnosis not present

## 2022-01-31 DIAGNOSIS — E669 Obesity, unspecified: Secondary | ICD-10-CM | POA: Diagnosis not present

## 2022-02-27 DIAGNOSIS — E1165 Type 2 diabetes mellitus with hyperglycemia: Secondary | ICD-10-CM | POA: Diagnosis not present

## 2022-02-27 DIAGNOSIS — I10 Essential (primary) hypertension: Secondary | ICD-10-CM | POA: Diagnosis not present

## 2022-03-15 ENCOUNTER — Ambulatory Visit: Payer: Medicare HMO | Admitting: Adult Health

## 2022-03-24 DIAGNOSIS — E782 Mixed hyperlipidemia: Secondary | ICD-10-CM | POA: Diagnosis not present

## 2022-03-24 DIAGNOSIS — I129 Hypertensive chronic kidney disease with stage 1 through stage 4 chronic kidney disease, or unspecified chronic kidney disease: Secondary | ICD-10-CM | POA: Diagnosis not present

## 2022-03-24 DIAGNOSIS — E1169 Type 2 diabetes mellitus with other specified complication: Secondary | ICD-10-CM | POA: Diagnosis not present

## 2022-03-24 DIAGNOSIS — I152 Hypertension secondary to endocrine disorders: Secondary | ICD-10-CM | POA: Diagnosis not present

## 2022-03-24 DIAGNOSIS — I10 Essential (primary) hypertension: Secondary | ICD-10-CM | POA: Diagnosis not present

## 2022-03-24 DIAGNOSIS — E1165 Type 2 diabetes mellitus with hyperglycemia: Secondary | ICD-10-CM | POA: Diagnosis not present

## 2022-03-24 DIAGNOSIS — E1159 Type 2 diabetes mellitus with other circulatory complications: Secondary | ICD-10-CM | POA: Diagnosis not present

## 2022-03-24 DIAGNOSIS — E1122 Type 2 diabetes mellitus with diabetic chronic kidney disease: Secondary | ICD-10-CM | POA: Diagnosis not present

## 2022-05-08 ENCOUNTER — Encounter: Payer: Self-pay | Admitting: Adult Health

## 2022-05-08 ENCOUNTER — Ambulatory Visit: Payer: Medicare HMO | Admitting: Adult Health

## 2022-05-08 VITALS — BP 138/68 | HR 51 | Ht 62.0 in | Wt 236.1 lb

## 2022-05-08 DIAGNOSIS — Z8673 Personal history of transient ischemic attack (TIA), and cerebral infarction without residual deficits: Secondary | ICD-10-CM

## 2022-05-08 DIAGNOSIS — G3184 Mild cognitive impairment, so stated: Secondary | ICD-10-CM | POA: Diagnosis not present

## 2022-05-08 NOTE — Progress Notes (Signed)
[facility] [address] [place]. [place] [address] [phone]       OFFICE FOLLOW UP NOTE  [name] Date of Birth:  06/02/1966 Medical Record Number:  [id number]   Referring MD: Lucianne Lei  Reason for Referral: Memory loss  Chief Complaint  Patient presents with   Follow-up    Pt is well. No questions or concerns. Room 2 with daughter       HPI:   Update 05/08/2022 JM: Patient returns for stroke follow-up accompanied by her daughter after prior visit 8 months ago.  Overall stable without new stroke/TIA symptoms.  Reports cognition stable.  Remains on Aricept 10 mg nightly per PCP, denies side effects. Evidence of right hemianopia on prior exam, repeat MR brain no new strokes, likely residual from prior strokes.  Compliant on Plavix and atorvastatin, denies side effects.  Blood pressure today 138/68.  He was recently started on Jardiance and Ozempic with improvement of A1c.  Routinely follows with PCP Dr. Ernie Hew every 3 months with routine lab work, unable to view via epic.  No new concerns at this time.      History provided for reference purposes only Update 09/14/2021 JM: Returns for 75-monthstroke follow-up with her daughter.  Overall stable from stroke standpoint -no new stroke/TIA symptoms Cognitive impairment stable. MMSE today 24/30 (prior 25/30) Remains on Aricept 10 mg nightly -denies side effects.   Compliant on Plavix and atorvastatin -denies side effects Blood pressure today 130/67, HR 49, baseline per daughter and asymptomatic.  Daughter mentions generalized touch sensitivity, specifically grimacing more when getting her hair done and sensitivity to cold. Not localized to one specific area or on one side. Chronic issue - daughter unsure if worsening or just noticing more.  Patient denies any associated symptoms such as numbness/tingling or loss of sensation.   Of note, COVID + in 05/2021 - has since recovered well  No further  concerns at this time   Update 03/15/2021 JM: Annette Munoz for stroke follow-up after prior initial consult visit with Dr. SLeonie Man3 months ago.  She is accompanied by her daughter.  Stable since prior visit without new stroke/TIA symptoms.  Residual cognitive impairment has been stable.  She is able to maintain all ADLs independently and some IADLs with family assistance as needed.  She does not drive.  Reversible causes of memory loss evaluated after prior visit which was all satisfactory except elevated homocystine level and recommend initiating folic acid as well as tobacco cessation.  Reports compliance with folic acid as well as Aricept tolerating without side effects.  Compliant on Plavix and atorvastatin 80 mg daily without associated side effects Blood pressure today 138/81 Glucose levels not routinely monitored Routinely follows with Dr. BCriss Rosalesfor HTN, HLD and DM management  No new concerns at this time  Consult visit 11/23/2020 Dr. SLeonie Man Annette Munoz is a pleasant 89-yearAfrican-American lady seen today for office consultation visit for memory loss following stroke. History is obtained from the patient and her daughters accompanying her as well as review of electronic medical records and reviewed pertinent imaging films in PACS. She has past medical history of diabetes, hypertension, obesity, hyperlipidemia who has history of stroke since [facility] status and memory difficulties and MRI showed multiple small acute left anterior cerebral artery infarct involving corpus callosum, cingulate gyrus and superior left frontal lobe as well as small infarct involving anterior inferior right thalamus//cerebral peduncle.  2D echo and carotid ultrasound were unremarkable LDL cholesterol 138 and A1c was 7.2. She was subsequently readmitted on 05/18/2016 with new acute infarct involving medial right hemisphere and anteriormost thalamus and genu  of internal capsule. TEE at this time was negative for PFO or left atrial thrombus. Transcranial Doppler monitoring was suboptimal and no microembolic signals were noted. 30-day cardiac monitoring was negative for A. fib. She was placed on dual antiplatelet therapy and risk recommended aggressive risk factor modification. She will last seen in the office in clinic by Dr. Erlinda Hong in December 2018 and then lost to follow-up. The patient and daughter state that she has had persistent memory difficulties since her strokes which have been not getting any better. This is mostly short-term memory and otherwise she is quite independent in most of her activities of daily living. She has not had any significant behavioral changes to the daughter feels she may be depressed as she no longer goes out as much participate in other activities outside the house. She has been pretty compliant with her stroke prevention regimen. She has stopped aspirin and is currently on Plavix alone. Blood pressure is well controlled today it is 135/76. Last hemoglobin A1c was 6. Lipid profile was also satisfactory. She has been started on Aricept for her memory loss and has been taking 10 mg daily and tolerating it well without side effects. She does do solitaire religiously every day but does not do other activities which are cognitively challenging. She has not had any recent lab work or brain imaging studies done. She has had no recent recurrent stroke or TIA symptoms either.    ROS:   14 system review of systems is positive for those listed in HPI and all other systems negative    PMH:  Past Medical History:  Diagnosis Date   Anemia    Arthritis    Diabetes mellitus without complication ([place])    Hypertension    Stroke [facility])     Social History:  Social History   Socioeconomic History   Marital status: Married    Spouse name: Not on file   Number of children: Not on file   Years of education: Not on file   Highest education  level: Not on file  Occupational History   Occupation: disabilaity  Tobacco Use   Smoking status: Never   Smokeless tobacco: Never  Vaping Use   Vaping Use: Never used  Substance and Sexual Activity   Alcohol use: No    Alcohol/week: 1.0 standard drink of alcohol    Types: 1 Shots of liquor per week    Comment: occasionally   Drug use: No   Sexual activity: Yes    Partners: Male  Other Topics Concern   Not on file  Social History Narrative   Lives with husband and patient;s brother   Right Handed   Driinks no caffeine   Social Determinants of Health   Financial Resource Strain: Not on file  Food Insecurity: Not on file  Transportation Needs: Not on file  Physical Activity: Not on file  Stress: Not on file  Social Connections: Not on file  Intimate Partner Violence: Not on file    Medications:   Current Outpatient Medications on File Prior to Visit  Medication Sig Dispense Refill   Alcohol Swabs (ALCOHOL PREP) PADS Use to check blood sugar daily. E11.65 100 each 3   amLODipine (NORVASC) 10 MG tablet Take 1 tablet (10 mg total) by mouth daily. (Patient taking differently:  Take 5 mg by mouth daily.) 30 tablet 5   atorvastatin (LIPITOR) 80 MG tablet TAKE 1 TABLET (80 MG TOTAL) BY MOUTH DAILY AT 6 PM. 90 tablet 1   blood glucose meter kit and supplies KIT Use daily to check blood sugar. E11.65 1 each 0   Blood Pressure Monitoring (PREMIUM AUTOMATIC BP MONITOR) DEVI 1 Device by Does not apply route daily. 1 Device 0   citalopram (CELEXA) 20 MG tablet      clopidogrel (PLAVIX) 75 MG tablet Take 1 tablet (75 mg total) by mouth daily. 30 tablet 0   donepezil (ARICEPT) 10 MG tablet Take 1 tablet (10 mg total) by mouth at bedtime. 90 tablet 3   folic acid (FOLVITE) 1 MG tablet Take 1 tablet (1 mg total) by mouth daily. 90 tablet 3   glucose blood test strip Use to check blood sugar daily. E11.65 100 each 3   Iron-FA-B Cmp-C-Biot-Probiotic (FUSION PLUS) CAPS      Lancet Devices  (LANCING DEVICE) MISC Use daily to check blood sugar. E11.65 1 each 0   Lancets MISC Use to check blood sugar daily. E11.65 100 each 3   valsartan-hydrochlorothiazide (DIOVAN-HCT) 320-12.5 MG tablet Take 1 tablet by mouth daily.      GLYXAMBI 10-5 MG TABS  (Patient not taking: Reported on 05/08/2022)     megestrol (MEGACE) 40 MG tablet Take 1 tablet (40 mg total) by mouth daily. Can increase to twice a day in the event of heavy bleeding (Patient not taking: Reported on 05/08/2022) 60 tablet 5   No current facility-administered medications on file prior to visit.    Allergies:   Allergies  Allergen Reactions   Lisinopril Cough    Physical Exam Today's Vitals   05/08/22 0736  BP: 138/68  Pulse: (!) 51  Weight: 236 lb 2 oz (107.1 kg)  Height: 5' 2"  (1.575 m)    Body mass index is 43.19 kg/m.  General: Obese middle-aged African-American lady seated, in no evident distress Head: head normocephalic and atraumatic.   Neck: supple with no carotid or supraclavicular bruits Cardiovascular: regular rate and rhythm, no murmurs Musculoskeletal: no deformity Skin:  no rash/petichiae Vascular:  Normal pulses all extremities  Neurologic Exam Mental Status: Awake and fully alert.  Fluent speech and language.  Oriented to place and time. Recent memory impaired and remote memory intact. Attention span, concentration and fund of knowledge slightly diminished with daughter providing majority of history. Mood and affect appropriate.    05/08/2022    8:01 AM 09/14/2021    7:57 AM 11/23/2020    3:19 PM  MMSE - Mini Mental State Exam  Orientation to time 5 2 4   Orientation to Place 5 5 5   Registration 3 3 3   Attention/ Calculation 3 4 5   Recall 1 1 0  Language- name 2 objects 2 2 2   Language- repeat 1 1 1   Language- follow 3 step command 3 3 3   Language- read & follow direction 1 1 1   Write a sentence 1 1 1   Copy design 1 1 0  Total score 26 24 25    Cranial Nerves: Pupils equal, briskly  reactive to light. Extraocular movements full without nystagmus.  Visual fields show partial right homonymous hemianopsia. Hearing intact. Facial sensation intact. Motor: Normal bulk and tone. Normal strength in all tested extremity muscles except diminished fine finger movements on the right. Orbits left over right upper extremity. Sensory: intact to touch, pinprick, position and vibratory sensation.  Coordination: Rapid  alternating movements normal in all extremities except slightly decreased right hand. Finger-to-nose and heel-to-shin performed accurately bilaterally. Gait and Station: Arises from chair slowly. Stance is normal. Gait demonstrates decreased stride length and step height of right leg with use of cane.  Unable to complete tandem walk and heel toe.  Reflexes: 1+ and asymmetric and brisker on the right compared to the left. Toes downgoing.      ASSESSMENT: 56 year old African-American lady with a remote history of multiple bilateral mostly subcortical strokes with residual mild cognitive impairment with short term memory loss and right hemianopia. Vascular risk factors of hypertension, diabetes, hyperlipidemia ,obesity and intracranial stenosis and COVID + 05/2021     PLAN:  Remote multiple strokes -Continue Plavix and atorvastatin 80 mg for secondary stroke prevention  -Continue close PCP follow-up for aggressive stroke risk factor management to maintain strict control of hypertension with blood pressure goal below 130/90, diabetes with hemoglobin A1c goal below 7 % and lipids with LDL cholesterol goal below 70 mg/dL   MCI -Stable since prior visit - MMSE today 26/30 (prior 24/30) -Continue Aricept 50m nightly - managed by PCP -increase participation in cognitively challenging activities like solving crossword puzzles, playing bridge and sodoku.  We also discussed memory compensation strategies.       Overall stable neurologically. Advised to continue to follow with PCP  for cognitive monitoring and aggressive stroke risk factor management and can f/u with [place]   CC:  DFanny Bien MD   I spent 34 minutes of face-to-face and non-face-to-face time with patient and daughter.  This included previsit chart review, lab review, study review, electronic health record documentation, patient and daughter education and discussion regarding history of prior strokes, secondary stroke prevention measures and aggressive stroke risk factor management, MCI with review of MMSE and answered all other questions to patient and daughters satisfaction    JFrann Rider [facility] [address] [address] Phone [phone] [phone]: This document was prepared with digital dictation and possible smart phrase technology. Any transcriptional errors that result from this process are unintentional.

## 2022-05-08 NOTE — Patient Instructions (Signed)
No changes today  Continue Aricept 10 mg nightly managed by your PCP Dr. Duanne Guess   Continue to follow with PCP for aggressive stroke risk factor management as well as ongoing use of Plavix and atorvastatin for secondary stroke prevention measures

## 2022-07-05 ENCOUNTER — Emergency Department (HOSPITAL_COMMUNITY): Payer: Medicare HMO

## 2022-07-05 ENCOUNTER — Other Ambulatory Visit: Payer: Self-pay

## 2022-07-05 ENCOUNTER — Emergency Department (HOSPITAL_COMMUNITY)
Admission: EM | Admit: 2022-07-05 | Discharge: 2022-07-05 | Disposition: A | Discharge status: Home / Self Care | Payer: Medicare HMO | Attending: Emergency Medicine | Admitting: Emergency Medicine

## 2022-07-05 DIAGNOSIS — Z7901 Long term (current) use of anticoagulants: Secondary | ICD-10-CM | POA: Insufficient documentation

## 2022-07-05 DIAGNOSIS — R112 Nausea with vomiting, unspecified: Secondary | ICD-10-CM | POA: Insufficient documentation

## 2022-07-05 DIAGNOSIS — I10 Essential (primary) hypertension: Secondary | ICD-10-CM | POA: Insufficient documentation

## 2022-07-05 DIAGNOSIS — E119 Type 2 diabetes mellitus without complications: Secondary | ICD-10-CM | POA: Insufficient documentation

## 2022-07-05 DIAGNOSIS — R079 Chest pain, unspecified: Secondary | ICD-10-CM | POA: Diagnosis not present

## 2022-07-05 DIAGNOSIS — Z79899 Other long term (current) drug therapy: Secondary | ICD-10-CM | POA: Insufficient documentation

## 2022-07-05 DIAGNOSIS — R739 Hyperglycemia, unspecified: Secondary | ICD-10-CM | POA: Diagnosis not present

## 2022-07-05 DIAGNOSIS — D72829 Elevated white blood cell count, unspecified: Secondary | ICD-10-CM | POA: Insufficient documentation

## 2022-07-05 LAB — COMPREHENSIVE METABOLIC PANEL
ALT: 22 U/L (ref 0–44)
AST: 23 U/L (ref 15–41)
Albumin: 4 g/dL (ref 3.5–5.0)
Alkaline Phosphatase: 87 U/L (ref 38–126)
Anion gap: 11 (ref 5–15)
BUN: 17 mg/dL (ref 6–20)
CO2: 19 mmol/L — ABNORMAL LOW (ref 22–32)
Calcium: 9.1 mg/dL (ref 8.9–10.3)
Chloride: 109 mmol/L (ref 98–111)
Creatinine, Ser: 1.81 mg/dL — ABNORMAL HIGH (ref 0.44–1.00)
GFR, Estimated: 33 mL/min — ABNORMAL LOW (ref >60)
Glucose, Bld: 179 mg/dL — ABNORMAL HIGH (ref 70–99)
Potassium: 3.8 mmol/L (ref 3.5–5.1)
Sodium: 139 mmol/L (ref 135–145)
Total Bilirubin: 0.9 mg/dL (ref 0.3–1.2)
Total Protein: 7.8 g/dL (ref 6.5–8.1)

## 2022-07-05 LAB — CBC WITH DIFFERENTIAL/PLATELET
Abs Immature Granulocytes: 0.04 10*3/uL (ref 0.00–0.07)
Basophils Absolute: 0 10*3/uL (ref 0.0–0.1)
Basophils Relative: 0 %
Eosinophils Absolute: 0.1 10*3/uL (ref 0.0–0.5)
Eosinophils Relative: 1 %
HCT: 42.9 % (ref 36.0–46.0)
Hemoglobin: 14 g/dL (ref 12.0–15.0)
Immature Granulocytes: 0 %
Lymphocytes Relative: 6 %
Lymphs Abs: 0.7 10*3/uL (ref 0.7–4.0)
MCH: 32.3 pg (ref 26.0–34.0)
MCHC: 32.6 g/dL (ref 30.0–36.0)
MCV: 98.8 fL (ref 80.0–100.0)
Monocytes Absolute: 0.9 10*3/uL (ref 0.1–1.0)
Monocytes Relative: 7 %
Neutro Abs: 10.3 10*3/uL — ABNORMAL HIGH (ref 1.7–7.7)
Neutrophils Relative %: 86 %
Platelets: 346 10*3/uL (ref 150–400)
RBC: 4.34 MIL/uL (ref 3.87–5.11)
RDW: 13.6 % (ref 11.5–15.5)
WBC: 12 10*3/uL — ABNORMAL HIGH (ref 4.0–10.5)
nRBC: 0 % (ref 0.0–0.2)

## 2022-07-05 LAB — CBG MONITORING, ED: Glucose-Capillary: 182 mg/dL — ABNORMAL HIGH (ref 70–99)

## 2022-07-05 LAB — URINALYSIS, ROUTINE W REFLEX MICROSCOPIC
Bilirubin Urine: NEGATIVE
Glucose, UA: 150 mg/dL — AB
Hgb urine dipstick: NEGATIVE
Ketones, ur: NEGATIVE mg/dL
Leukocytes,Ua: NEGATIVE
Nitrite: NEGATIVE
Protein, ur: 100 mg/dL — AB
Specific Gravity, Urine: 1.028 (ref 1.005–1.030)
pH: 5 (ref 5.0–8.0)

## 2022-07-05 LAB — LIPASE, BLOOD: Lipase: 53 U/L — ABNORMAL HIGH (ref 11–51)

## 2022-07-05 LAB — RAPID URINE DRUG SCREEN, HOSP PERFORMED
Amphetamines: NOT DETECTED
Barbiturates: NOT DETECTED
Benzodiazepines: NOT DETECTED
Cocaine: NOT DETECTED
Opiates: NOT DETECTED
Tetrahydrocannabinol: NOT DETECTED

## 2022-07-05 LAB — TROPONIN I (HIGH SENSITIVITY): Troponin I (High Sensitivity): 5 ng/L (ref <18)

## 2022-07-05 MED ORDER — ONDANSETRON HCL 4 MG PO TABS: 4.0000 mg | ORAL_TABLET | Freq: Four times a day (QID) | ORAL | 0 refills | Status: DC

## 2022-07-05 MED ORDER — SODIUM CHLORIDE 0.9 % IV BOLUS
500.0000 mL | Freq: Once | INTRAVENOUS | Status: AC
  Administered 2022-07-05: 500 mL via INTRAVENOUS

## 2022-07-05 NOTE — ED Provider Notes (Signed)
[facility] EMERGENCY DEPARTMENT Provider Note   CSN: [id number] Arrival date & time: 07/05/22  0124     History  Chief Complaint  Patient presents with   Emesis   Nausea    Annette Munoz is a 56 y.o. female.  HPI   Patient with medical history including diabetes, type II, hypertension, CVA, with associated right lower leg weakness and cognitive delay for complaints of nausea and vomiting.  Patient states it started today, came on after she woke up, states that she was only able to eat eat small amount of food, she has no associated stomach pain, denies hematemesis or coffee-ground emesis, she does not remember the last time she had a bowel movement nor does she remember when she passed flatus, but she has no stomach surgeries, no history of Crohn's disease or UC, she is not nursing any urinary symptoms, no flank tenderness.  She denies any URI-like symptoms fevers chills, she has no other complaints at this time.  Patient's daughter is at bedside states that patient had nausea and vomiting earlier today, states that she has been eating popcorn, patient is at her baseline, she states that the patient likely just got dehydrated as she improved with IV fluids.   Home Medications Prior to Admission medications   Medication Sig Start Date End Date Taking? Authorizing Provider  ondansetron (ZOFRAN) 4 MG tablet Take 1 tablet (4 mg total) by mouth every 6 (six) hours. 07/05/22  Yes Marcello Fennel, PA-C  Alcohol Swabs (ALCOHOL PREP) PADS Use to check blood sugar daily. E11.65 09/16/15   Gale Journey, Damaris Hippo, PA-C  amLODipine (NORVASC) 10 MG tablet Take 1 tablet (10 mg total) by mouth daily. Patient taking differently: Take 5 mg by mouth daily. 05/23/16   Rosalin Hawking, MD  atorvastatin (LIPITOR) 80 MG tablet TAKE 1 TABLET (80 MG TOTAL) BY MOUTH DAILY AT 6 PM. 10/22/17   Rosalin Hawking, MD  blood glucose meter kit and supplies KIT Use daily to check blood sugar. E11.65 09/16/15   Weber,  Damaris Hippo, PA-C  Blood Pressure Monitoring (PREMIUM AUTOMATIC BP MONITOR) DEVI 1 Device by Does not apply route daily. 08/17/15   Weber, Damaris Hippo, PA-C  citalopram (CELEXA) 20 MG tablet  03/30/20   [provider]  clopidogrel (PLAVIX) 75 MG tablet Take 1 tablet (75 mg total) by mouth daily. 05/21/16   Velvet Bathe, MD  donepezil (ARICEPT) 10 MG tablet Take 1 tablet (10 mg total) by mouth at bedtime. 07/23/17   Rosalin Hawking, MD  folic acid (FOLVITE) 1 MG tablet Take 1 tablet (1 mg total) by mouth daily. 12/13/21   Frann Rider, NP  glucose blood test strip Use to check blood sugar daily. E11.65 09/16/15   Gale Journey, Damaris Hippo, PA-C  GLYXAMBI 10-5 MG TABS  01/18/21   [provider]  Iron-FA-B Cmp-C-Biot-Probiotic (FUSION PLUS) CAPS  05/14/20   [provider]  Lancet Devices (LANCING DEVICE) MISC Use daily to check blood sugar. E11.65 09/16/15   Mancel Bale, PA-C  Lancets MISC Use to check blood sugar daily. E11.65 09/16/15   Gale Journey, Damaris Hippo, PA-C  megestrol (MEGACE) 40 MG tablet Take 1 tablet (40 mg total) by mouth daily. Can increase to twice a day in the event of heavy bleeding Patient not taking: Reported on 05/08/2022 02/09/20   Constant, Peggy, MD  valsartan-hydrochlorothiazide (DIOVAN-HCT) 320-12.5 MG tablet Take 1 tablet by mouth daily.     [provider]  Allergies    Lisinopril    Review of Systems   Review of Systems  Constitutional:  Negative for chills and fever.  Respiratory:  Negative for shortness of breath.   Cardiovascular:  Negative for chest pain.  Gastrointestinal:  Positive for nausea and vomiting. Negative for abdominal pain.  Neurological:  Negative for headaches.    Physical Exam Updated Vital Signs BP 100/84   Pulse 60   Temp 98.7 F (37.1 C) (Oral)   Resp 15   SpO2 97%  Physical Exam Vitals and nursing note reviewed.  Constitutional:      General: She is not in acute distress.    Appearance: She is not ill-appearing.  HENT:      Head: Normocephalic and atraumatic.     Nose: No congestion.  Eyes:     Conjunctiva/sclera: Conjunctivae normal.  Cardiovascular:     Rate and Rhythm: Normal rate and regular rhythm.     Pulses: Normal pulses.     Heart sounds: No murmur heard.    No friction rub. No gallop.  Pulmonary:     Effort: No respiratory distress.     Breath sounds: No wheezing, rhonchi or rales.  Abdominal:     Palpations: Abdomen is soft.     Tenderness: There is no abdominal tenderness. There is no right CVA tenderness or left CVA tenderness.     Comments: Abdomen nondistended, soft, nontender my exam.  Skin:    General: Skin is warm and dry.  Neurological:     Mental Status: She is alert.     Cranial Nerves: Cranial nerves 2-12 are intact.     Motor: Weakness present.     Coordination: Romberg sign negative. Finger-Nose-Finger Test normal.     Comments: Alert and orient x4, cranial nerves II through XII grossly intact no difficulty with word finding following two-step commands slight weakness noted in the right lower leg, 4-5 strength this is per her baseline.  Psychiatric:        Mood and Affect: Mood normal.     ED Results / Procedures / Treatments   Labs (all labs ordered are listed, but only abnormal results are displayed) Labs Reviewed  COMPREHENSIVE METABOLIC PANEL - Abnormal; Notable for the following components:      Result Value   CO2 19 (*)    Glucose, Bld 179 (*)    Creatinine, Ser 1.81 (*)    GFR, Estimated 33 (*)    All other components within normal limits  CBC WITH DIFFERENTIAL/PLATELET - Abnormal; Notable for the following components:   WBC 12.0 (*)    Neutro Abs 10.3 (*)    All other components within normal limits  LIPASE, BLOOD - Abnormal; Notable for the following components:   Lipase 53 (*)    All other components within normal limits  URINALYSIS, ROUTINE W REFLEX MICROSCOPIC - Abnormal; Notable for the following components:   Color, Urine AMBER (*)    APPearance  CLOUDY (*)    Glucose, UA 150 (*)    Protein, ur 100 (*)    Bacteria, UA FEW (*)    All other components within normal limits  CBG MONITORING, ED - Abnormal; Notable for the following components:   Glucose-Capillary 182 (*)    All other components within normal limits  RAPID URINE DRUG SCREEN, HOSP PERFORMED  TROPONIN I (HIGH SENSITIVITY)    EKG EKG Interpretation  Date/Time:  Wednesday July 05 2022 01:31:18 EDT Ventricular Rate:  72 PR Interval:  141  QRS Duration: 96 QT Interval:  396 QTC Calculation: 434 R Axis:   -10 Text Interpretation: Sinus rhythm Low voltage, extremity and precordial leads Since last tracing low voltage QRS, but otherwise no significant change Confirmed by Calvert Cantor [phone]) on 07/05/2022 2:31:43 AM  Radiology DG Chest 2 View  Result Date: 07/05/2022 CLINICAL DATA:  Chest pain. EXAM: CHEST - 2 VIEW COMPARISON:  None Available. FINDINGS: The heart size and mediastinal contours are within normal limits. Both lungs are clear. The visualized skeletal structures are unremarkable. IMPRESSION: No active cardiopulmonary disease. Electronically Signed   By: Virgina Norfolk M.D.   On: 07/05/2022 03:56    Procedures Procedures    Medications Ordered in ED Medications  sodium chloride 0.9 % bolus 500 mL (0 mLs Intravenous Stopped 07/05/22 0553)    ED Course/ Medical Decision Making/ A&P                           Medical Decision Making Amount and/or Complexity of Data Reviewed Labs: ordered. Radiology: ordered.   This patient presents to the ED for concern of nausea vomiting, this involves an extensive number of treatment options, and is a complaint that carries with it a high risk of complications and morbidity.  The differential diagnosis includes bowel obstruction, volvulus, diverticulitis, pancreatitis, DKA    Additional history obtained:  Additional history obtained from daughter at bedside External records from outside source obtained  and reviewed including neurology notes   Co morbidities that complicate the patient evaluation  Diabetes, CVA  Social Determinants of Health:  N/A    Lab Tests:  I Ordered, and personally interpreted labs.  The pertinent results include: CBC shows leukocytosis, CBG shows 182, CMP shows CO2 of 19, glucose 179, creatinine 1.8, UA shows red blood cells white blood cells few bacteria squamous cells present, lipase 53, first troponin is 5,   Imaging Studies ordered:  I ordered imaging studies including chest x-ray I independently visualized and interpreted imaging which showed acute findings I agree with the radiologist interpretation   Cardiac Monitoring:  The patient was maintained on a cardiac monitor.  I personally viewed and interpreted the cardiac monitored which showed an underlying rhythm of: Without signs of ischemia   Medicines ordered and prescription drug management:  I ordered medication including N/A I have reviewed the patients home medicines and have made adjustments as needed  Critical Interventions:  N/A   Reevaluation:  Presents with nausea and vomiting since resolved benign physical exam, will check lab work also add on troponin as well as chest x-ray as she endorses shortness of breath prior to arrival.  Patient was reassessed resting comfortably, she is tolerating p.o., abdomen soft nontender, she is agreement plan discharge at this time.    Consultations Obtained:  N/A    Test Considered:  CT AP-deferred as my suspicion for an abdominal abnormality low at this time, nonsurgical abdomen, she is nontoxic-appearing, tolerating p.o.    Rule out low suspicion for lower lobe pneumonia as lung sounds are clear bilaterally, will defer imaging at this time.  I have low suspicion for liver or gallbladder abnormality as she has no right upper quadrant tenderness, liver enzymes, alk phos, T bili all within normal limits.  Low suspicion for  pancreatitis as lipase not 3 times the upper limit.  Low suspicion for ruptured stomach ulcer as she has no peritoneal sign present on exam.  Low suspicion for bowel obstruction as abdomen is  nondistended normal bowel sounds, so passing gas and having normal bowel movements.  Low suspicion for complicated diverticulitis as she is nontoxic-appearing, vital signs reassuring no leukocytosis present.  Low suspicion for appendicitis as she has no right lower quadrant tenderness, vital signs reassuring.  I have low suspicion for DKA she had no anion gap, no ketones seen in her urine, she does have a slight decrease in CO2 I suspect this is likely secondary due to dehydration.  I have low suspicion for intra-abdominal infection as she has low risk factors, vital signs reassuring.  It is noted that she has a slight leukocytosis but suspect this more acute phase reactant from vomiting I doubt infectious etiology at this time.  Low risk for UTI, pyelo-, kidney stone not endorsing any urinary symptoms, UA is negative for nitrates, and leukocytes, does show white blood cells red blood cells by suspect this is contaminant due to squamous cells present.     Dispostion and problem list  After consideration of the diagnostic results and the patients response to treatment, I feel that the patent would benefit from discharge.  Nausea vomiting-since resolved, suspect viral gastritis, will recommend bland diet, follow-up PCP for further evaluation and strict return precautions.            Final Clinical Impression(s) / ED Diagnoses Final diagnoses:  Nausea and vomiting, unspecified vomiting type    Rx / DC Orders ED Discharge Orders          Ordered    ondansetron (ZOFRAN) 4 MG tablet  Every 6 hours        07/05/22 0636              Marcello Fennel, PA-C 07/05/22 6333    Truddie Hidden, MD 07/05/22 [phone]

## 2022-07-05 NOTE — Discharge Instructions (Addendum)
Lab work is all reassuring, I recommend a bland diet, can use Zofran use as needed for nausea.  May follow-up with PCP as needed.  Come back to the emergency department if you develop chest pain, shortness of breath, severe abdominal pain, uncontrolled nausea, vomiting, diarrhea.

## 2022-07-05 NOTE — ED Triage Notes (Signed)
Pt is here from home via [facility] for N/V x1 hour. Pt states symptoms came on suddenly, denies abd pain. Per EMS upon arrival pt was lethargic but responded to voice & was able to answer questions appropriately. Pt given 531ml NS and 4mg  zofran, 18g RAC. Hx of diabetes and TIAs. Cbg 317, 119/90, [id number]

## 2022-07-05 NOTE — ED Notes (Signed)
Patient transported to X-ray 

## 2022-07-14 ENCOUNTER — Telehealth: Payer: Self-pay

## 2022-07-14 NOTE — Telephone Encounter (Signed)
     Patient  visit on 9/20  at [facility]   Have you been able to follow up with your primary care physician?yes  The patient was or was not able to obtain any needed medicine or equipment. No I sent a message to the embedded care team for help with pharmacy needs  Are there diet recommendations that you are having difficulty following? NA  Patient expresses understanding of discharge instructions and education provided has no other needs at this time.  [place], [facility], Care Management  [phone] [address], [place], [place] [address] Phone: [phone] Email: [email or web address]

## 2022-07-18 ENCOUNTER — Telehealth: Payer: Self-pay | Admitting: Pharmacist

## 2022-07-18 NOTE — Progress Notes (Signed)
[facility]) Care Management  [name]   07/18/2022  Annette Munoz 04-30-1966 [id number]  Reason for referral: Medication Assistance  Referral source: Annette [facility] RN Referral medication(s): Jardiance Current insurance:[employer] HMO   HPI: Including but not limited to:  Depression, History of  CVA, hypertension & hyperlipidemia.    Objective: Allergies  Allergen Reactions   Lisinopril Cough    Medications Reviewed Today     Reviewed by Annette Rider, NP (Nurse Practitioner) on 05/08/22 at [phone]  Med List Status: <None>   Medication Order Taking? Sig Documenting Provider Last Dose Status Informant  Alcohol Swabs (ALCOHOL PREP) PADS [id number] Yes Use to check blood sugar daily. E11.65 Annette Bale, PA-C Taking Active Child  amLODipine (NORVASC) 10 MG tablet [id number] Yes Take 1 tablet (10 mg total) by mouth daily.  Patient taking differently: Take 5 mg by mouth daily.   Annette Hawking, MD Taking Active Child  atorvastatin (LIPITOR) 80 MG tablet [id number] Yes TAKE 1 TABLET (80 MG TOTAL) BY MOUTH DAILY AT 6 PM. Annette Hawking, MD Taking Active   blood glucose meter kit and supplies KIT [id number] Yes Use daily to check blood sugar. E11.65 Weber, Damaris Hippo, PA-C Taking Active Child  Blood Pressure Monitoring (PREMIUM AUTOMATIC BP MONITOR) DEVI [id number] Yes 1 Device by Does not apply route daily. Weber, Damaris Hippo, PA-C Taking Active Child  citalopram (CELEXA) 20 MG tablet [id number] Yes  [provider] Taking Active   clopidogrel (PLAVIX) 75 MG tablet [id number] Yes Take 1 tablet (75 mg total) by mouth daily. Annette Bathe, MD Taking Active Child  donepezil (ARICEPT) 10 MG tablet [id number] Yes Take 1 tablet (10 mg total) by mouth at bedtime. Annette Hawking, MD Taking Active   folic acid (FOLVITE) 1 MG tablet [id number] Yes Take 1 tablet (1 mg total) by mouth daily. Annette Rider, NP Taking Active   glucose blood test strip [id number] Yes Use to check blood sugar daily.  E11.65 Annette Bale, PA-C Taking Active Child  GLYXAMBI 10-5 MG TABS [id number] No   Patient not taking: Reported on 05/08/2022   [provider] Not Taking Active   Iron-FA-B Cmp-C-Biot-Probiotic (FUSION PLUS) CAPS [id number] Yes  [provider] Taking Active   Lancet Devices (LANCING DEVICE) MISC [id number] Yes Use daily to check blood sugar. E11.65 Annette Bale, PA-C Taking Active Child  Lancets MISC [id number] Yes Use to check blood sugar daily. E11.65 Annette Bale, PA-C Taking Active Child  megestrol (MEGACE) 40 MG tablet [id number] No Take 1 tablet (40 mg total) by mouth daily. Can increase to twice a day in the event of heavy bleeding  Patient not taking: Reported on 05/08/2022   Constant, Peggy, MD Not Taking Active   valsartan-hydrochlorothiazide (DIOVAN-HCT) 320-12.5 MG tablet [id number] Yes Take 1 tablet by mouth daily.  [provider] Taking Active Child             Medication Assistance Findings:  Medication assistance needs identified: Jardiance--[employer]--patient appears to qualify.  Also completed an Extra Help/LIS application with CMS as well.   Glyxambi is on her medication list but patient reported no longer taking it and that her therapy was switched to [place].     Additional medication assistance options reviewed with patient as warranted:  No other options identified  Plan: I will route patient assistance letter to [place] technician who will coordinate patient assistance program application process for medications listed above.  [facility] pharmacy technician will assist with obtaining all  required documents from both patient and provider(s) and submit application(s) once completed.     Annette Munoz, PharmD, [place] Clinical Pharmacist [phone]

## 2022-07-21 ENCOUNTER — Telehealth: Payer: Self-pay | Admitting: Pharmacy Technician

## 2022-07-21 DIAGNOSIS — E1165 Type 2 diabetes mellitus with hyperglycemia: Secondary | ICD-10-CM | POA: Diagnosis not present

## 2022-07-21 DIAGNOSIS — Z596 Low income: Secondary | ICD-10-CM

## 2022-07-21 DIAGNOSIS — E785 Hyperlipidemia, unspecified: Secondary | ICD-10-CM | POA: Diagnosis not present

## 2022-07-21 DIAGNOSIS — D649 Anemia, unspecified: Secondary | ICD-10-CM | POA: Diagnosis not present

## 2022-07-21 DIAGNOSIS — I10 Essential (primary) hypertension: Secondary | ICD-10-CM | POA: Diagnosis not present

## 2022-07-21 NOTE — Progress Notes (Signed)
[facility])                                            [place] Team    07/21/2022  Annette Munoz 07-10-1966 [id number]                                                                        Medication Assistance Referral  Referral From: [place]  Medication/Company: Vania Rea / BI Patient application portion:  Mailed Provider application portion: Faxed  to Dr. Rachell Cipro Provider address/fax verified via: Office website    Annette Munoz P. Kamaryn Grimley, [place]  [phone]

## 2022-07-26 DIAGNOSIS — E1165 Type 2 diabetes mellitus with hyperglycemia: Secondary | ICD-10-CM | POA: Diagnosis not present

## 2022-07-26 DIAGNOSIS — E1121 Type 2 diabetes mellitus with diabetic nephropathy: Secondary | ICD-10-CM | POA: Diagnosis not present

## 2022-07-26 DIAGNOSIS — E1122 Type 2 diabetes mellitus with diabetic chronic kidney disease: Secondary | ICD-10-CM | POA: Diagnosis not present

## 2022-07-26 DIAGNOSIS — E559 Vitamin D deficiency, unspecified: Secondary | ICD-10-CM | POA: Diagnosis not present

## 2022-07-26 DIAGNOSIS — N189 Chronic kidney disease, unspecified: Secondary | ICD-10-CM | POA: Diagnosis not present

## 2022-07-26 DIAGNOSIS — I10 Essential (primary) hypertension: Secondary | ICD-10-CM | POA: Diagnosis not present

## 2022-07-26 DIAGNOSIS — R001 Bradycardia, unspecified: Secondary | ICD-10-CM | POA: Diagnosis not present

## 2022-07-26 DIAGNOSIS — I129 Hypertensive chronic kidney disease with stage 1 through stage 4 chronic kidney disease, or unspecified chronic kidney disease: Secondary | ICD-10-CM | POA: Diagnosis not present

## 2022-08-02 DIAGNOSIS — Z23 Encounter for immunization: Secondary | ICD-10-CM | POA: Diagnosis not present

## 2022-08-23 DIAGNOSIS — Z114 Encounter for screening for human immunodeficiency virus [HIV]: Secondary | ICD-10-CM | POA: Diagnosis not present

## 2022-08-23 DIAGNOSIS — Z0000 Encounter for general adult medical examination without abnormal findings: Secondary | ICD-10-CM | POA: Diagnosis not present

## 2022-08-23 DIAGNOSIS — E782 Mixed hyperlipidemia: Secondary | ICD-10-CM | POA: Diagnosis not present

## 2022-08-30 DIAGNOSIS — Z1339 Encounter for screening examination for other mental health and behavioral disorders: Secondary | ICD-10-CM | POA: Diagnosis not present

## 2022-08-30 DIAGNOSIS — Z1331 Encounter for screening for depression: Secondary | ICD-10-CM | POA: Diagnosis not present

## 2022-08-30 DIAGNOSIS — Z0000 Encounter for general adult medical examination without abnormal findings: Secondary | ICD-10-CM | POA: Diagnosis not present

## 2022-08-30 DIAGNOSIS — I10 Essential (primary) hypertension: Secondary | ICD-10-CM | POA: Diagnosis not present

## 2022-10-24 DIAGNOSIS — E785 Hyperlipidemia, unspecified: Secondary | ICD-10-CM | POA: Diagnosis not present

## 2022-10-24 DIAGNOSIS — E1165 Type 2 diabetes mellitus with hyperglycemia: Secondary | ICD-10-CM | POA: Diagnosis not present

## 2022-10-24 DIAGNOSIS — E559 Vitamin D deficiency, unspecified: Secondary | ICD-10-CM | POA: Diagnosis not present

## 2022-10-24 DIAGNOSIS — E1122 Type 2 diabetes mellitus with diabetic chronic kidney disease: Secondary | ICD-10-CM | POA: Diagnosis not present

## 2022-10-26 ENCOUNTER — Other Ambulatory Visit: Payer: Self-pay | Admitting: Family Medicine

## 2022-10-26 DIAGNOSIS — E782 Mixed hyperlipidemia: Secondary | ICD-10-CM | POA: Diagnosis not present

## 2022-10-26 DIAGNOSIS — I639 Cerebral infarction, unspecified: Secondary | ICD-10-CM | POA: Diagnosis not present

## 2022-10-26 DIAGNOSIS — E559 Vitamin D deficiency, unspecified: Secondary | ICD-10-CM | POA: Diagnosis not present

## 2022-10-26 DIAGNOSIS — N189 Chronic kidney disease, unspecified: Secondary | ICD-10-CM | POA: Diagnosis not present

## 2022-10-26 DIAGNOSIS — E1121 Type 2 diabetes mellitus with diabetic nephropathy: Secondary | ICD-10-CM | POA: Diagnosis not present

## 2022-10-26 DIAGNOSIS — E1165 Type 2 diabetes mellitus with hyperglycemia: Secondary | ICD-10-CM | POA: Diagnosis not present

## 2022-10-26 DIAGNOSIS — I152 Hypertension secondary to endocrine disorders: Secondary | ICD-10-CM | POA: Diagnosis not present

## 2022-10-26 DIAGNOSIS — I10 Essential (primary) hypertension: Secondary | ICD-10-CM | POA: Diagnosis not present

## 2022-10-26 DIAGNOSIS — E1159 Type 2 diabetes mellitus with other circulatory complications: Secondary | ICD-10-CM | POA: Diagnosis not present

## 2022-10-26 DIAGNOSIS — E1122 Type 2 diabetes mellitus with diabetic chronic kidney disease: Secondary | ICD-10-CM | POA: Diagnosis not present

## 2022-10-26 DIAGNOSIS — I129 Hypertensive chronic kidney disease with stage 1 through stage 4 chronic kidney disease, or unspecified chronic kidney disease: Secondary | ICD-10-CM | POA: Diagnosis not present

## 2022-10-26 DIAGNOSIS — Z1231 Encounter for screening mammogram for malignant neoplasm of breast: Secondary | ICD-10-CM

## 2022-11-28 ENCOUNTER — Ambulatory Visit
Admission: RE | Admit: 2022-11-28 | Discharge: 2022-11-28 | Disposition: A | Discharge status: Home / Self Care | Payer: Medicaid Other | Source: Ambulatory Visit | Attending: Family Medicine | Admitting: Family Medicine

## 2022-11-28 ENCOUNTER — Encounter: Payer: Self-pay | Admitting: Radiology

## 2022-11-28 DIAGNOSIS — Z1231 Encounter for screening mammogram for malignant neoplasm of breast: Secondary | ICD-10-CM

## 2022-12-28 ENCOUNTER — Other Ambulatory Visit: Payer: Self-pay | Admitting: Adult Health

## 2023-01-05 DIAGNOSIS — H52203 Unspecified astigmatism, bilateral: Secondary | ICD-10-CM | POA: Diagnosis not present

## 2023-01-05 DIAGNOSIS — H25813 Combined forms of age-related cataract, bilateral: Secondary | ICD-10-CM | POA: Diagnosis not present

## 2023-01-05 DIAGNOSIS — E119 Type 2 diabetes mellitus without complications: Secondary | ICD-10-CM | POA: Diagnosis not present

## 2023-01-05 DIAGNOSIS — H4720 Unspecified optic atrophy: Secondary | ICD-10-CM | POA: Diagnosis not present

## 2023-01-05 DIAGNOSIS — H5340 Unspecified visual field defects: Secondary | ICD-10-CM | POA: Diagnosis not present

## 2023-01-05 DIAGNOSIS — H5213 Myopia, bilateral: Secondary | ICD-10-CM | POA: Diagnosis not present

## 2023-01-15 DIAGNOSIS — I10 Essential (primary) hypertension: Secondary | ICD-10-CM | POA: Diagnosis not present

## 2023-01-15 DIAGNOSIS — E1165 Type 2 diabetes mellitus with hyperglycemia: Secondary | ICD-10-CM | POA: Diagnosis not present

## 2023-01-23 DIAGNOSIS — E1159 Type 2 diabetes mellitus with other circulatory complications: Secondary | ICD-10-CM | POA: Diagnosis not present

## 2023-01-23 DIAGNOSIS — R7989 Other specified abnormal findings of blood chemistry: Secondary | ICD-10-CM | POA: Diagnosis not present

## 2023-01-23 DIAGNOSIS — E1121 Type 2 diabetes mellitus with diabetic nephropathy: Secondary | ICD-10-CM | POA: Diagnosis not present

## 2023-01-23 DIAGNOSIS — R69 Illness, unspecified: Secondary | ICD-10-CM | POA: Diagnosis not present

## 2023-01-23 DIAGNOSIS — I10 Essential (primary) hypertension: Secondary | ICD-10-CM | POA: Diagnosis not present

## 2023-01-23 DIAGNOSIS — G4700 Insomnia, unspecified: Secondary | ICD-10-CM | POA: Diagnosis not present

## 2023-01-23 DIAGNOSIS — I6930 Unspecified sequelae of cerebral infarction: Secondary | ICD-10-CM | POA: Diagnosis not present

## 2023-01-23 DIAGNOSIS — I152 Hypertension secondary to endocrine disorders: Secondary | ICD-10-CM | POA: Diagnosis not present

## 2023-01-23 DIAGNOSIS — E1165 Type 2 diabetes mellitus with hyperglycemia: Secondary | ICD-10-CM | POA: Diagnosis not present

## 2023-02-09 DIAGNOSIS — I639 Cerebral infarction, unspecified: Secondary | ICD-10-CM | POA: Diagnosis not present

## 2023-02-09 DIAGNOSIS — E1122 Type 2 diabetes mellitus with diabetic chronic kidney disease: Secondary | ICD-10-CM | POA: Diagnosis not present

## 2023-02-09 DIAGNOSIS — R413 Other amnesia: Secondary | ICD-10-CM | POA: Diagnosis not present

## 2023-02-09 DIAGNOSIS — N1832 Chronic kidney disease, stage 3b: Secondary | ICD-10-CM | POA: Diagnosis not present

## 2023-02-09 DIAGNOSIS — I129 Hypertensive chronic kidney disease with stage 1 through stage 4 chronic kidney disease, or unspecified chronic kidney disease: Secondary | ICD-10-CM | POA: Diagnosis not present

## 2023-02-09 DIAGNOSIS — E785 Hyperlipidemia, unspecified: Secondary | ICD-10-CM | POA: Diagnosis not present

## 2023-02-09 DIAGNOSIS — E669 Obesity, unspecified: Secondary | ICD-10-CM | POA: Diagnosis not present

## 2023-02-26 DIAGNOSIS — N189 Chronic kidney disease, unspecified: Secondary | ICD-10-CM | POA: Diagnosis not present

## 2023-02-26 DIAGNOSIS — I10 Essential (primary) hypertension: Secondary | ICD-10-CM | POA: Diagnosis not present

## 2023-02-26 DIAGNOSIS — I6930 Unspecified sequelae of cerebral infarction: Secondary | ICD-10-CM | POA: Diagnosis not present

## 2023-04-25 DIAGNOSIS — E1165 Type 2 diabetes mellitus with hyperglycemia: Secondary | ICD-10-CM | POA: Diagnosis not present

## 2023-04-25 DIAGNOSIS — E782 Mixed hyperlipidemia: Secondary | ICD-10-CM | POA: Diagnosis not present

## 2023-04-25 DIAGNOSIS — E1122 Type 2 diabetes mellitus with diabetic chronic kidney disease: Secondary | ICD-10-CM | POA: Diagnosis not present

## 2023-04-25 DIAGNOSIS — I10 Essential (primary) hypertension: Secondary | ICD-10-CM | POA: Diagnosis not present

## 2023-04-30 DIAGNOSIS — E1159 Type 2 diabetes mellitus with other circulatory complications: Secondary | ICD-10-CM | POA: Diagnosis not present

## 2023-04-30 DIAGNOSIS — E782 Mixed hyperlipidemia: Secondary | ICD-10-CM | POA: Diagnosis not present

## 2023-04-30 DIAGNOSIS — I152 Hypertension secondary to endocrine disorders: Secondary | ICD-10-CM | POA: Diagnosis not present

## 2023-04-30 DIAGNOSIS — Z789 Other specified health status: Secondary | ICD-10-CM | POA: Diagnosis not present

## 2023-04-30 DIAGNOSIS — E1121 Type 2 diabetes mellitus with diabetic nephropathy: Secondary | ICD-10-CM | POA: Diagnosis not present

## 2023-04-30 DIAGNOSIS — E1165 Type 2 diabetes mellitus with hyperglycemia: Secondary | ICD-10-CM | POA: Diagnosis not present

## 2023-04-30 DIAGNOSIS — I10 Essential (primary) hypertension: Secondary | ICD-10-CM | POA: Diagnosis not present

## 2023-04-30 DIAGNOSIS — R7989 Other specified abnormal findings of blood chemistry: Secondary | ICD-10-CM | POA: Diagnosis not present

## 2023-05-18 DIAGNOSIS — M545 Low back pain, unspecified: Secondary | ICD-10-CM | POA: Diagnosis not present

## 2023-05-18 DIAGNOSIS — R350 Frequency of micturition: Secondary | ICD-10-CM | POA: Diagnosis not present

## 2023-05-18 DIAGNOSIS — N3000 Acute cystitis without hematuria: Secondary | ICD-10-CM | POA: Diagnosis not present

## 2023-05-21 DIAGNOSIS — I10 Essential (primary) hypertension: Secondary | ICD-10-CM | POA: Diagnosis not present

## 2023-05-30 DIAGNOSIS — E1165 Type 2 diabetes mellitus with hyperglycemia: Secondary | ICD-10-CM | POA: Diagnosis not present

## 2023-05-30 DIAGNOSIS — Z23 Encounter for immunization: Secondary | ICD-10-CM | POA: Diagnosis not present

## 2023-05-30 DIAGNOSIS — E1121 Type 2 diabetes mellitus with diabetic nephropathy: Secondary | ICD-10-CM | POA: Diagnosis not present

## 2023-05-30 DIAGNOSIS — E1122 Type 2 diabetes mellitus with diabetic chronic kidney disease: Secondary | ICD-10-CM | POA: Diagnosis not present

## 2023-05-30 DIAGNOSIS — I129 Hypertensive chronic kidney disease with stage 1 through stage 4 chronic kidney disease, or unspecified chronic kidney disease: Secondary | ICD-10-CM | POA: Diagnosis not present

## 2023-05-30 DIAGNOSIS — E1143 Type 2 diabetes mellitus with diabetic autonomic (poly)neuropathy: Secondary | ICD-10-CM | POA: Diagnosis not present

## 2023-05-30 DIAGNOSIS — I10 Essential (primary) hypertension: Secondary | ICD-10-CM | POA: Diagnosis not present

## 2023-05-30 DIAGNOSIS — R7989 Other specified abnormal findings of blood chemistry: Secondary | ICD-10-CM | POA: Diagnosis not present

## 2023-06-08 ENCOUNTER — Other Ambulatory Visit: Payer: Self-pay | Admitting: Adult Health

## 2023-06-11 DIAGNOSIS — I10 Essential (primary) hypertension: Secondary | ICD-10-CM | POA: Diagnosis not present

## 2023-06-11 DIAGNOSIS — R5383 Other fatigue: Secondary | ICD-10-CM | POA: Diagnosis not present

## 2023-06-11 DIAGNOSIS — N189 Chronic kidney disease, unspecified: Secondary | ICD-10-CM | POA: Diagnosis not present

## 2023-06-11 DIAGNOSIS — D631 Anemia in chronic kidney disease: Secondary | ICD-10-CM | POA: Diagnosis not present

## 2023-06-20 ENCOUNTER — Encounter (HOSPITAL_COMMUNITY): Payer: Self-pay

## 2023-06-20 ENCOUNTER — Emergency Department (HOSPITAL_COMMUNITY)
Admission: EM | Admit: 2023-06-20 | Discharge: 2023-06-20 | Disposition: A | Discharge status: Home / Self Care | Payer: Medicare HMO | Source: Home / Self Care | Attending: Emergency Medicine | Admitting: Emergency Medicine

## 2023-06-20 DIAGNOSIS — Z09 Encounter for follow-up examination after completed treatment for conditions other than malignant neoplasm: Secondary | ICD-10-CM | POA: Diagnosis not present

## 2023-06-20 DIAGNOSIS — R55 Syncope and collapse: Secondary | ICD-10-CM | POA: Insufficient documentation

## 2023-06-20 DIAGNOSIS — N950 Postmenopausal bleeding: Secondary | ICD-10-CM | POA: Diagnosis not present

## 2023-06-20 DIAGNOSIS — Z79899 Other long term (current) drug therapy: Secondary | ICD-10-CM | POA: Insufficient documentation

## 2023-06-20 DIAGNOSIS — F0390 Unspecified dementia without behavioral disturbance: Secondary | ICD-10-CM | POA: Insufficient documentation

## 2023-06-20 DIAGNOSIS — R001 Bradycardia, unspecified: Secondary | ICD-10-CM | POA: Insufficient documentation

## 2023-06-20 DIAGNOSIS — I10 Essential (primary) hypertension: Secondary | ICD-10-CM | POA: Insufficient documentation

## 2023-06-20 DIAGNOSIS — D500 Iron deficiency anemia secondary to blood loss (chronic): Secondary | ICD-10-CM | POA: Diagnosis not present

## 2023-06-20 DIAGNOSIS — N920 Excessive and frequent menstruation with regular cycle: Secondary | ICD-10-CM | POA: Diagnosis not present

## 2023-06-20 DIAGNOSIS — R58 Hemorrhage, not elsewhere classified: Secondary | ICD-10-CM | POA: Diagnosis not present

## 2023-06-20 DIAGNOSIS — R4182 Altered mental status, unspecified: Secondary | ICD-10-CM | POA: Insufficient documentation

## 2023-06-20 DIAGNOSIS — Z7902 Long term (current) use of antithrombotics/antiplatelets: Secondary | ICD-10-CM | POA: Insufficient documentation

## 2023-06-20 DIAGNOSIS — I951 Orthostatic hypotension: Secondary | ICD-10-CM | POA: Diagnosis not present

## 2023-06-20 DIAGNOSIS — E119 Type 2 diabetes mellitus without complications: Secondary | ICD-10-CM | POA: Insufficient documentation

## 2023-06-20 DIAGNOSIS — W19XXXA Unspecified fall, initial encounter: Secondary | ICD-10-CM | POA: Diagnosis not present

## 2023-06-20 LAB — CBC WITH DIFFERENTIAL/PLATELET
Abs Immature Granulocytes: 0.07 10*3/uL (ref 0.00–0.07)
Basophils Absolute: 0.1 10*3/uL (ref 0.0–0.1)
Basophils Relative: 1 %
Eosinophils Absolute: 0.2 10*3/uL (ref 0.0–0.5)
Eosinophils Relative: 2 %
HCT: 31.8 % — ABNORMAL LOW (ref 36.0–46.0)
Hemoglobin: 9.9 g/dL — ABNORMAL LOW (ref 12.0–15.0)
Immature Granulocytes: 1 %
Lymphocytes Relative: 18 %
Lymphs Abs: 2 10*3/uL (ref 0.7–4.0)
MCH: 30.6 pg (ref 26.0–34.0)
MCHC: 31.1 g/dL (ref 30.0–36.0)
MCV: 98.1 fL (ref 80.0–100.0)
Monocytes Absolute: 0.9 10*3/uL (ref 0.1–1.0)
Monocytes Relative: 8 %
Neutro Abs: 8 10*3/uL — ABNORMAL HIGH (ref 1.7–7.7)
Neutrophils Relative %: 70 %
Platelets: 413 10*3/uL — ABNORMAL HIGH (ref 150–400)
RBC: 3.24 MIL/uL — ABNORMAL LOW (ref 3.87–5.11)
RDW: 14.5 % (ref 11.5–15.5)
WBC: 11.3 10*3/uL — ABNORMAL HIGH (ref 4.0–10.5)
nRBC: 0 % (ref 0.0–0.2)

## 2023-06-20 LAB — BASIC METABOLIC PANEL
Anion gap: 12 (ref 5–15)
BUN: 31 mg/dL — ABNORMAL HIGH (ref 6–20)
CO2: 19 mmol/L — ABNORMAL LOW (ref 22–32)
Calcium: 8.4 mg/dL — ABNORMAL LOW (ref 8.9–10.3)
Chloride: 107 mmol/L (ref 98–111)
Creatinine, Ser: 1.82 mg/dL — ABNORMAL HIGH (ref 0.44–1.00)
GFR, Estimated: 32 mL/min — ABNORMAL LOW (ref >60)
Glucose, Bld: 205 mg/dL — ABNORMAL HIGH (ref 70–99)
Potassium: 3.7 mmol/L (ref 3.5–5.1)
Sodium: 138 mmol/L (ref 135–145)

## 2023-06-20 MED ORDER — SODIUM CHLORIDE 0.9 % IV BOLUS
500.0000 mL | Freq: Once | INTRAVENOUS | Status: AC
  Administered 2023-06-20: 500 mL via INTRAVENOUS

## 2023-06-20 NOTE — ED Triage Notes (Signed)
Patient slipped fell while in the shower, denies hitting head, denies LOC, denies having any pain at this time. Patient is not on blood thinners. HX of dementia, confused at times on baseline. Family also states patient has been on her cycle for 2 days, and patient has passed large blood clots.   EMS- all VSS and CBG 237

## 2023-06-20 NOTE — Discharge Instructions (Signed)
Your labs show a mildly low hemoglobin, likely due to the heavy menstrual bleeding. This will need close recheck. Please plan to see your doctor in the next 2-3 days.   Return to the emergency department with any new or worrisome symptoms at any time.

## 2023-06-20 NOTE — ED Provider Notes (Signed)
EMERGENCY DEPARTMENT AT [facility] Provider Note   CSN: [id number] Arrival date & time: 06/20/23  0258     History  Chief Complaint  Patient presents with   Marletta Lor    SHERRIE SINGELTON is a 57 y.o. female.  Patient with history of CVA, dementia, T2DM, HTN, anemia, arthritis presents after she fell this evening at home. Family heard to fall and was immediately present. Daughter provides history. The patient started menstruating as per her usual 3 days ago but passing large clots which is not usual. She started complaining of being dizzy 2 days ago. Daughter states she got up the bathroom tonight and was again bleeding heavily, passing clots, and went to shower where she fell. Daughter reports she was awake when they got to her, but appeared sweaty and confused. EMS was called. Daughter reports CBG on scene was 200. She has been having high blood sugar recently. No recent fever, illness. She is not anticoagulated. History of dementia but has been at her baseline mental status.   The history is provided by the patient. No language interpreter was used.  Fall       Home Medications Prior to Admission medications   Medication Sig Start Date End Date Taking? Authorizing Provider  Alcohol Swabs (ALCOHOL PREP) PADS Use to check blood sugar daily. E11.65 09/16/15   Valarie Cones, Dema Severin, PA-C  amLODipine (NORVASC) 10 MG tablet Take 1 tablet (10 mg total) by mouth daily. Patient taking differently: Take 5 mg by mouth daily. 05/23/16   Marvel Plan, MD  atorvastatin (LIPITOR) 80 MG tablet TAKE 1 TABLET (80 MG TOTAL) BY MOUTH DAILY AT 6 PM. 10/22/17   Marvel Plan, MD  blood glucose meter kit and supplies KIT Use daily to check blood sugar. E11.65 09/16/15   Weber, Dema Severin, PA-C  Blood Pressure Monitoring (PREMIUM AUTOMATIC BP MONITOR) DEVI 1 Device by Does not apply route daily. 08/17/15   Weber, Dema Severin, PA-C  citalopram (CELEXA) 20 MG tablet  03/30/20   [provider]  clopidogrel  (PLAVIX) 75 MG tablet Take 1 tablet (75 mg total) by mouth daily. 05/21/16   Penny Pia, MD  donepezil (ARICEPT) 10 MG tablet Take 1 tablet (10 mg total) by mouth at bedtime. 07/23/17   Marvel Plan, MD  empagliflozin (JARDIANCE) 25 MG TABS tablet Take 25 mg by mouth daily.    [provider]  folic acid (FOLVITE) 1 MG tablet TAKE 1 TABLET BY MOUTH EVERY MORNING *REFILL REQUEST* 06/11/23   Ihor Austin, NP  glucose blood test strip Use to check blood sugar daily. E11.65 09/16/15   Valarie Cones, Dema Severin, PA-C  GLYXAMBI 10-5 MG TABS  01/18/21   [provider]  Iron-FA-B Cmp-C-Biot-Probiotic (FUSION PLUS) CAPS  05/14/20   [provider]  Lancet Devices (LANCING DEVICE) MISC Use daily to check blood sugar. E11.65 09/16/15   Morrell Riddle, PA-C  Lancets MISC Use to check blood sugar daily. E11.65 09/16/15   Valarie Cones, Dema Severin, PA-C  megestrol (MEGACE) 40 MG tablet Take 1 tablet (40 mg total) by mouth daily. Can increase to twice a day in the event of heavy bleeding Patient not taking: Reported on 05/08/2022 02/09/20   Constant, Peggy, MD  ondansetron (ZOFRAN) 4 MG tablet Take 1 tablet (4 mg total) by mouth every 6 (six) hours. 07/05/22   Carroll Sage, PA-C  valsartan-hydrochlorothiazide (DIOVAN-HCT) 320-12.5 MG tablet Take 1 tablet by mouth daily.     [provider]  Allergies    Lisinopril    Review of Systems   Review of Systems  Physical Exam Updated Vital Signs BP (!) 106/52 (BP Location: Right Arm)   Pulse (!) 51   Temp 97.9 F (36.6 C) (Oral)   Resp 17   Ht 5\' 2"  (1.575 m)   Wt 107 kg   SpO2 99%   BMI 43.15 kg/m  Physical Exam Vitals and nursing note reviewed.  Constitutional:      General: She is not in acute distress.    Appearance: Normal appearance. She is obese.  HENT:     Head: Atraumatic.  Eyes:     Comments: Pale conjunctiva  Cardiovascular:     Rate and Rhythm: Regular rhythm. Bradycardia present.  Pulmonary:     Effort: Pulmonary  effort is normal.     Breath sounds: No wheezing, rhonchi or rales.  Abdominal:     Palpations: Abdomen is soft.     Tenderness: There is no abdominal tenderness.  Genitourinary:    Comments: No blood collecting on pad at present. Musculoskeletal:     Cervical back: Normal range of motion and neck supple.  Skin:    General: Skin is warm and dry.  Neurological:     Mental Status: She is alert.     Comments: Patient is pleasantly demented, awake, alert.     ED Results / Procedures / Treatments   Labs (all labs ordered are listed, but only abnormal results are displayed) Labs Reviewed  CBC WITH DIFFERENTIAL/PLATELET - Abnormal; Notable for the following components:      Result Value   WBC 11.3 (*)    RBC 3.24 (*)    Hemoglobin 9.9 (*)    HCT 31.8 (*)    Platelets 413 (*)    Neutro Abs 8.0 (*)    All other components within normal limits  BASIC METABOLIC PANEL - Abnormal; Notable for the following components:   CO2 19 (*)    Glucose, Bld 205 (*)    BUN 31 (*)    Creatinine, Ser 1.82 (*)    Calcium 8.4 (*)    GFR, Estimated 32 (*)    All other components within normal limits    EKG EKG Interpretation Date/Time:  Wednesday June 20 2023 04:09:34 EDT Ventricular Rate:  53 PR Interval:  137 QRS Duration:  96 QT Interval:  491 QTC Calculation: 461 R Axis:   2  Text Interpretation: Sinus rhythm Low voltage, extremity and precordial leads Confirmed by Kennis Carina [phone]) on 06/20/2023 4:23:41 AM  Radiology No results found.  Procedures Procedures    Medications Ordered in ED Medications  sodium chloride 0.9 % bolus 500 mL (0 mLs Intravenous Stopped 06/20/23 0538)  sodium chloride 0.9 % bolus 500 mL (500 mLs Intravenous New Bag/Given 06/20/23 0537)    ED Course/ Medical Decision Making/ A&P Clinical Course as of 06/20/23 0600  Wed Jun 20, 2023  6045 Patient to ED with near syncopal episode as detailed in the HPI. Labs significant for hgb 9.9, likely due to  heavy vaginal bleeding. Hemodynamically stable. She has been at baseline mental status since arrival. Observed ambulatory to the bathroom without symptoms. Renal dysfunction at baseline. She received IV fluids. Discussed importance of recheck of the hemoglobin level in 2-3 days. Discussed return to the ED with any recurrent symptoms or new concerns. Patient and daughter are comfortable with plan of discharge.  [SU]    Clinical Course User Index [SU] Elpidio Anis, PA-C  Medical Decision Making Amount and/or Complexity of Data Reviewed Labs: ordered.           Final Clinical Impression(s) / ED Diagnoses Final diagnoses:  Vasovagal near syncope    Rx / DC Orders ED Discharge Orders     None         Elpidio Anis, PA-C 06/20/23 0600    Sabas Sous, MD 06/20/23 [phone]

## 2023-06-21 ENCOUNTER — Other Ambulatory Visit: Payer: Self-pay | Admitting: Adult Health

## 2023-06-21 DIAGNOSIS — R42 Dizziness and giddiness: Secondary | ICD-10-CM | POA: Diagnosis not present

## 2023-06-21 DIAGNOSIS — D259 Leiomyoma of uterus, unspecified: Secondary | ICD-10-CM | POA: Diagnosis not present

## 2023-06-21 DIAGNOSIS — R001 Bradycardia, unspecified: Secondary | ICD-10-CM | POA: Diagnosis not present

## 2023-06-21 DIAGNOSIS — R531 Weakness: Secondary | ICD-10-CM | POA: Diagnosis not present

## 2023-06-21 DIAGNOSIS — N939 Abnormal uterine and vaginal bleeding, unspecified: Secondary | ICD-10-CM | POA: Diagnosis not present

## 2023-06-21 DIAGNOSIS — W19XXXA Unspecified fall, initial encounter: Secondary | ICD-10-CM | POA: Diagnosis not present

## 2023-06-21 DIAGNOSIS — R55 Syncope and collapse: Secondary | ICD-10-CM | POA: Diagnosis not present

## 2023-06-21 DIAGNOSIS — I10 Essential (primary) hypertension: Secondary | ICD-10-CM | POA: Diagnosis not present

## 2023-06-21 DIAGNOSIS — I959 Hypotension, unspecified: Secondary | ICD-10-CM | POA: Diagnosis not present

## 2023-06-21 DIAGNOSIS — R5383 Other fatigue: Secondary | ICD-10-CM | POA: Diagnosis not present

## 2023-06-21 DIAGNOSIS — Z888 Allergy status to other drugs, medicaments and biological substances status: Secondary | ICD-10-CM | POA: Diagnosis not present

## 2023-06-22 ENCOUNTER — Encounter (HOSPITAL_COMMUNITY): Payer: Self-pay | Admitting: Internal Medicine

## 2023-06-22 ENCOUNTER — Other Ambulatory Visit: Payer: Self-pay

## 2023-06-22 ENCOUNTER — Inpatient Hospital Stay (HOSPITAL_COMMUNITY)
Admission: EM | Admit: 2023-06-22 | Discharge: 2023-06-26 | DRG: 812 | Disposition: A | Discharge status: Home Health | Payer: Medicare HMO | Attending: Family Medicine | Admitting: Family Medicine

## 2023-06-22 DIAGNOSIS — I69311 Memory deficit following cerebral infarction: Secondary | ICD-10-CM | POA: Diagnosis not present

## 2023-06-22 DIAGNOSIS — R0602 Shortness of breath: Secondary | ICD-10-CM | POA: Diagnosis not present

## 2023-06-22 DIAGNOSIS — D259 Leiomyoma of uterus, unspecified: Secondary | ICD-10-CM | POA: Diagnosis present

## 2023-06-22 DIAGNOSIS — R42 Dizziness and giddiness: Secondary | ICD-10-CM | POA: Diagnosis not present

## 2023-06-22 DIAGNOSIS — I951 Orthostatic hypotension: Principal | ICD-10-CM | POA: Diagnosis present

## 2023-06-22 DIAGNOSIS — I69312 Visuospatial deficit and spatial neglect following cerebral infarction: Secondary | ICD-10-CM | POA: Diagnosis not present

## 2023-06-22 DIAGNOSIS — Z888 Allergy status to other drugs, medicaments and biological substances status: Secondary | ICD-10-CM | POA: Diagnosis not present

## 2023-06-22 DIAGNOSIS — Z6841 Body Mass Index (BMI) 40.0 and over, adult: Secondary | ICD-10-CM

## 2023-06-22 DIAGNOSIS — E1165 Type 2 diabetes mellitus with hyperglycemia: Secondary | ICD-10-CM | POA: Diagnosis not present

## 2023-06-22 DIAGNOSIS — E1151 Type 2 diabetes mellitus with diabetic peripheral angiopathy without gangrene: Secondary | ICD-10-CM | POA: Diagnosis not present

## 2023-06-22 DIAGNOSIS — Z823 Family history of stroke: Secondary | ICD-10-CM | POA: Diagnosis not present

## 2023-06-22 DIAGNOSIS — R0600 Dyspnea, unspecified: Secondary | ICD-10-CM | POA: Diagnosis not present

## 2023-06-22 DIAGNOSIS — E1122 Type 2 diabetes mellitus with diabetic chronic kidney disease: Secondary | ICD-10-CM | POA: Diagnosis not present

## 2023-06-22 DIAGNOSIS — E785 Hyperlipidemia, unspecified: Secondary | ICD-10-CM | POA: Diagnosis not present

## 2023-06-22 DIAGNOSIS — E119 Type 2 diabetes mellitus without complications: Secondary | ICD-10-CM

## 2023-06-22 DIAGNOSIS — Z9109 Other allergy status, other than to drugs and biological substances: Secondary | ICD-10-CM

## 2023-06-22 DIAGNOSIS — R55 Syncope and collapse: Principal | ICD-10-CM | POA: Diagnosis present

## 2023-06-22 DIAGNOSIS — D649 Anemia, unspecified: Secondary | ICD-10-CM | POA: Diagnosis not present

## 2023-06-22 DIAGNOSIS — I129 Hypertensive chronic kidney disease with stage 1 through stage 4 chronic kidney disease, or unspecified chronic kidney disease: Secondary | ICD-10-CM | POA: Diagnosis present

## 2023-06-22 DIAGNOSIS — F0393 Unspecified dementia, unspecified severity, with mood disturbance: Secondary | ICD-10-CM | POA: Diagnosis present

## 2023-06-22 DIAGNOSIS — I10 Essential (primary) hypertension: Secondary | ICD-10-CM | POA: Diagnosis not present

## 2023-06-22 DIAGNOSIS — N1832 Chronic kidney disease, stage 3b: Secondary | ICD-10-CM | POA: Diagnosis present

## 2023-06-22 DIAGNOSIS — N920 Excessive and frequent menstruation with regular cycle: Secondary | ICD-10-CM | POA: Diagnosis present

## 2023-06-22 DIAGNOSIS — Z7902 Long term (current) use of antithrombotics/antiplatelets: Secondary | ICD-10-CM | POA: Diagnosis not present

## 2023-06-22 DIAGNOSIS — R569 Unspecified convulsions: Secondary | ICD-10-CM | POA: Diagnosis present

## 2023-06-22 DIAGNOSIS — H53461 Homonymous bilateral field defects, right side: Secondary | ICD-10-CM | POA: Diagnosis present

## 2023-06-22 DIAGNOSIS — Z79899 Other long term (current) drug therapy: Secondary | ICD-10-CM | POA: Diagnosis not present

## 2023-06-22 DIAGNOSIS — J9811 Atelectasis: Secondary | ICD-10-CM | POA: Diagnosis not present

## 2023-06-22 DIAGNOSIS — E861 Hypovolemia: Secondary | ICD-10-CM | POA: Diagnosis not present

## 2023-06-22 DIAGNOSIS — Z833 Family history of diabetes mellitus: Secondary | ICD-10-CM

## 2023-06-22 DIAGNOSIS — F32A Depression, unspecified: Secondary | ICD-10-CM | POA: Diagnosis not present

## 2023-06-22 DIAGNOSIS — R252 Cramp and spasm: Secondary | ICD-10-CM | POA: Diagnosis not present

## 2023-06-22 DIAGNOSIS — I679 Cerebrovascular disease, unspecified: Secondary | ICD-10-CM | POA: Diagnosis not present

## 2023-06-22 DIAGNOSIS — D500 Iron deficiency anemia secondary to blood loss (chronic): Principal | ICD-10-CM | POA: Diagnosis present

## 2023-06-22 DIAGNOSIS — Z8249 Family history of ischemic heart disease and other diseases of the circulatory system: Secondary | ICD-10-CM

## 2023-06-22 DIAGNOSIS — N938 Other specified abnormal uterine and vaginal bleeding: Secondary | ICD-10-CM | POA: Diagnosis not present

## 2023-06-22 DIAGNOSIS — Z7984 Long term (current) use of oral hypoglycemic drugs: Secondary | ICD-10-CM | POA: Diagnosis not present

## 2023-06-22 DIAGNOSIS — N924 Excessive bleeding in the premenopausal period: Secondary | ICD-10-CM | POA: Diagnosis not present

## 2023-06-22 LAB — CBC
HCT: 25.6 % — ABNORMAL LOW (ref 36.0–46.0)
Hemoglobin: 8 g/dL — ABNORMAL LOW (ref 12.0–15.0)
MCH: 31.1 pg (ref 26.0–34.0)
MCHC: 31.3 g/dL (ref 30.0–36.0)
MCV: 99.6 fL (ref 80.0–100.0)
Platelets: 399 10*3/uL (ref 150–400)
RBC: 2.57 MIL/uL — ABNORMAL LOW (ref 3.87–5.11)
RDW: 14.8 % (ref 11.5–15.5)
WBC: 12.2 10*3/uL — ABNORMAL HIGH (ref 4.0–10.5)
nRBC: 0 % (ref 0.0–0.2)

## 2023-06-22 LAB — IRON AND TIBC
Iron: 21 ug/dL — ABNORMAL LOW (ref 28–170)
Saturation Ratios: 7 % — ABNORMAL LOW (ref 10.4–31.8)
TIBC: 308 ug/dL (ref 250–450)
UIBC: 287 ug/dL

## 2023-06-22 LAB — PREPARE RBC (CROSSMATCH)

## 2023-06-22 LAB — RETICULOCYTES
Immature Retic Fract: 26.6 % — ABNORMAL HIGH (ref 2.3–15.9)
RBC.: 2.3 MIL/uL — ABNORMAL LOW (ref 3.87–5.11)
Retic Count, Absolute: 117.1 K/uL (ref 19.0–186.0)
Retic Ct Pct: 5.1 % — ABNORMAL HIGH (ref 0.4–3.1)

## 2023-06-22 LAB — HEMOGLOBIN AND HEMATOCRIT, BLOOD
HCT: 23.3 % — ABNORMAL LOW (ref 36.0–46.0)
HCT: 27.8 % — ABNORMAL LOW (ref 36.0–46.0)
Hemoglobin: 7.2 g/dL — ABNORMAL LOW (ref 12.0–15.0)
Hemoglobin: 8.9 g/dL — ABNORMAL LOW (ref 12.0–15.0)

## 2023-06-22 LAB — HEMOGLOBIN A1C
Hgb A1c MFr Bld: 6.3 % — ABNORMAL HIGH (ref 4.8–5.6)
Mean Plasma Glucose: 134.11 mg/dL

## 2023-06-22 LAB — GLUCOSE, CAPILLARY
Glucose-Capillary: 163 mg/dL — ABNORMAL HIGH (ref 70–99)
Glucose-Capillary: 161 mg/dL — ABNORMAL HIGH (ref 70–99)
Glucose-Capillary: 248 mg/dL — ABNORMAL HIGH (ref 70–99)
Glucose-Capillary: 237 mg/dL — ABNORMAL HIGH (ref 70–99)
Glucose-Capillary: 232 mg/dL — ABNORMAL HIGH (ref 70–99)

## 2023-06-22 LAB — TSH: TSH: 2.435 u[IU]/mL (ref 0.350–4.500)

## 2023-06-22 LAB — BASIC METABOLIC PANEL
Anion gap: 9 (ref 5–15)
BUN: 27 mg/dL — ABNORMAL HIGH (ref 6–20)
CO2: 19 mmol/L — ABNORMAL LOW (ref 22–32)
Calcium: 8.1 mg/dL — ABNORMAL LOW (ref 8.9–10.3)
Chloride: 109 mmol/L (ref 98–111)
Creatinine, Ser: 1.63 mg/dL — ABNORMAL HIGH (ref 0.44–1.00)
GFR, Estimated: 37 mL/min — ABNORMAL LOW (ref >60)
Glucose, Bld: 187 mg/dL — ABNORMAL HIGH (ref 70–99)
Potassium: 3.6 mmol/L (ref 3.5–5.1)
Sodium: 137 mmol/L (ref 135–145)

## 2023-06-22 LAB — FERRITIN: Ferritin: 10 ng/mL — ABNORMAL LOW (ref 11–307)

## 2023-06-22 LAB — ABO/RH: ABO/RH(D): O POS

## 2023-06-22 LAB — HIV ANTIBODY (ROUTINE TESTING W REFLEX): HIV Screen 4th Generation wRfx: NONREACTIVE

## 2023-06-22 LAB — VITAMIN B12: Vitamin B-12: 471 pg/mL (ref 180–914)

## 2023-06-22 LAB — FOLATE: Folate: 22 ng/mL (ref >5.9)

## 2023-06-22 MED ORDER — NORETHINDRONE ACETATE 5 MG PO TABS
10.0000 mg | ORAL_TABLET | Freq: Every day | ORAL | Status: DC
  Administered 2023-06-22 – 2023-06-26 (×5): 10 mg via ORAL
  Filled 2023-06-22 (×6): qty 2

## 2023-06-22 MED ORDER — INFLUENZA VIRUS VACC SPLIT PF (FLUZONE) 0.5 ML IM SUSY
0.5000 mL | PREFILLED_SYRINGE | INTRAMUSCULAR | Status: DC
  Filled 2023-06-22 (×2): qty 0.5

## 2023-06-22 MED ORDER — SODIUM CHLORIDE 0.9% IV SOLUTION: Freq: Once | INTRAVENOUS | Status: AC

## 2023-06-22 MED ORDER — SODIUM CHLORIDE 0.9 % IV SOLN: INTRAVENOUS | Status: AC

## 2023-06-22 MED ORDER — INSULIN ASPART 100 UNIT/ML IJ SOLN
0.0000 [IU] | Freq: Three times a day (TID) | INTRAMUSCULAR | Status: DC
  Administered 2023-06-22: 1 [IU] via SUBCUTANEOUS
  Administered 2023-06-22 – 2023-06-23 (×2): 2 [IU] via SUBCUTANEOUS
  Administered 2023-06-23 – 2023-06-24 (×4): 1 [IU] via SUBCUTANEOUS
  Administered 2023-06-25: 2 [IU] via SUBCUTANEOUS

## 2023-06-22 MED ORDER — ACETAMINOPHEN 325 MG PO TABS
650.0000 mg | ORAL_TABLET | Freq: Four times a day (QID) | ORAL | Status: DC | PRN
  Filled 2023-06-22: qty 2

## 2023-06-22 MED ORDER — ATORVASTATIN CALCIUM 40 MG PO TABS
40.0000 mg | ORAL_TABLET | Freq: Every day | ORAL | Status: DC
  Administered 2023-06-22 – 2023-06-26 (×5): 40 mg via ORAL
  Filled 2023-06-22 (×5): qty 1

## 2023-06-22 MED ORDER — CITALOPRAM HYDROBROMIDE 10 MG PO TABS
20.0000 mg | ORAL_TABLET | Freq: Every day | ORAL | Status: DC
  Administered 2023-06-22 – 2023-06-26 (×5): 20 mg via ORAL
  Filled 2023-06-22 (×5): qty 2

## 2023-06-22 MED ORDER — ONDANSETRON HCL 4 MG/2ML IJ SOLN: 4.0000 mg | Freq: Four times a day (QID) | INTRAMUSCULAR | Status: DC | PRN

## 2023-06-22 MED ORDER — ONDANSETRON HCL 4 MG PO TABS: 4.0000 mg | ORAL_TABLET | Freq: Four times a day (QID) | ORAL | Status: DC | PRN

## 2023-06-22 MED ORDER — SODIUM CHLORIDE 0.9 % IV BOLUS
500.0000 mL | Freq: Once | INTRAVENOUS | Status: AC
  Administered 2023-06-22: 500 mL via INTRAVENOUS

## 2023-06-22 MED ORDER — ACETAMINOPHEN 650 MG RE SUPP: 650.0000 mg | Freq: Four times a day (QID) | RECTAL | Status: DC | PRN

## 2023-06-22 MED ORDER — ALBUTEROL SULFATE (2.5 MG/3ML) 0.083% IN NEBU: 2.5000 mg | INHALATION_SOLUTION | RESPIRATORY_TRACT | Status: DC | PRN

## 2023-06-22 MED ORDER — FOLIC ACID 1 MG PO TABS
1.0000 mg | ORAL_TABLET | Freq: Every day | ORAL | Status: DC
  Administered 2023-06-22 – 2023-06-26 (×5): 1 mg via ORAL
  Filled 2023-06-22 (×5): qty 1
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
## 2023-06-22 NOTE — TOC Initial Note (Signed)
Transition of Care [facility]) - Initial/Assessment Note    Patient Details  Name: Annette Munoz MRN: [id number] Date of Birth: 09/20/1966  Transition of [facility]) CM/SW Contact:    Kermit Balo, RN Phone Number: 06/22/2023, 1:59 PM  Clinical Narrative:                 Pt is from home with spouse and daughter. She has someone with her most of the time.  Family provides needed transportation. Pt receives bubble packs for her medications through [facility].  TOC following for further d/c needs.  Family will transport home at d/c.   Expected Discharge Plan: Home/Self Care Barriers to Discharge: Continued Medical Work up   Patient Goals and CMS Choice            Expected Discharge Plan and Services       Living arrangements for the past 2 months: Single Family Home                                      Prior Living Arrangements/Services Living arrangements for the past 2 months: Single Family Home Lives with:: Adult Children, Spouse Patient language and need for interpreter reviewed:: Yes Do you feel safe going back to the place where you live?: Yes        Care giver support system in place?: Yes (comment)   Criminal Activity/Legal Involvement Pertinent to Current Situation/Hospitalization: No - Comment as needed  Activities of Daily Living Home Assistive Devices/Equipment: Dan Humphreys (specify type) ADL Screening (condition at time of admission) Patient's cognitive ability adequate to safely complete daily activities?: Yes Is the patient deaf or have difficulty hearing?: No Does the patient have difficulty seeing, even when wearing glasses/contacts?: No Does the patient have difficulty concentrating, remembering, or making decisions?: Yes Patient able to express need for assistance with ADLs?: Yes Does the patient have difficulty dressing or bathing?: No Independently performs ADLs?: Yes (appropriate for developmental age) Does the patient have difficulty  walking or climbing stairs?: Yes Weakness of Legs: Both Weakness of Arms/Hands: None  Permission Sought/Granted                  Emotional Assessment Appearance:: Appears stated age Attitude/Demeanor/Rapport:  (defers questions to her daughter) Affect (typically observed): Quiet Orientation: : Oriented to Self, Oriented to Place, Oriented to Situation   Psych Involvement: No (comment)  Admission diagnosis:  Syncope [R55] DUB (dysfunctional uterine bleeding) [N93.8] Recurrent syncope [R55] Patient Active Problem List   Diagnosis Date Noted   Syncope 06/22/2023   Intracranial vascular stenosis 12/05/2016   Depression 12/05/2016   Seizure-like activity (HCC) 06/28/2016   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 [place] [address] Phone: [phone] Fax: [phone]  [facility] - [address] [place] - [address] [place] [address] Phone:  [phone] Fax: [phone]  [facility] - [address], [place] - [address] [place] [address] Phone: [phone] Fax: [phone]     Social Determinants of Health (SDOH) Social History: SDOH Screenings   Food Insecurity: No Food Insecurity (06/22/2023)  Housing: Patient Declined (06/22/2023)  Transportation Needs: No Transportation Needs (06/22/2023)  Utilities: Not At Risk (06/22/2023)  Depression (PHQ2-9):  Medium Risk (02/09/2020)  Social Connections: Unknown (06/20/2023)   Received from [facility]  Tobacco Use: Low Risk  (06/22/2023)   SDOH Interventions:     Readmission Risk Interventions     No data to display

## 2023-06-22 NOTE — Care Plan (Signed)
This 57 years old female with PMH significant for type 2 diabetes, hypertension, CVA with residual memory deficits, fibroids, heavy menses, anemia presented in the ED for the second time in 1 week with episode of syncope. Patient  was initially seen on 9/4 with an episode of vasovagal syncope in the shower in the setting of heavy menstruation.  Patient was evaluated and discharged home.  atient had another episode and was taken to the [facility] where she was treated with Aygestin which as per daughter stopped the bleeding.  As per daughter these episodes continue to happen with position change.  She had an episode where she lost consciousness briefly and then returned to baseline. Patient was also recently started on carvedilol 12 weeks ago.  Patient is found to have hemoglobin 7.3.  Patient was admitted for further evaluation.  Patient is being given 1 unit of packed red blood cells for anemia.  She reports vaginal bleeding has stopped.  Patient was seen and examined at bedside she feels improved.

## 2023-06-22 NOTE — H&P (Addendum)
History and Physical    Annette Munoz DGU:[id number] DOB: 08-21-1966 DOA: 06/22/2023  PCP: Lewis Moccasin, MD  Patient coming from: home  I have personally briefly reviewed patient's old medical records in [facility]  Chief Complaint: syncope  HPI: Annette Munoz is a 57 y.o. female with medical history significant of  DMII, HTN, CVA with residual memory deficits, fibroids, heavy menses, anemia who presents to  [facility] ED for the 2nd time in one week with episode of syncope.  Patient initially seen on 9/4 with episode of vasovagal syncope in shower in the setting of heavy menses. Patient evaluated and discharged home. Patient had another episode was taken to [facility]. Patient on this ER visit was treated with Aygestn which per her daughter stopped bleeding. However this evening patient again had another syncopal episode. Per daughter episodes occur when she changes position. She notes that patient has brief seconds of LOC and then returns to baseline. Daughter also mentions that patient recently stared carvedilol  two weeks ago. She notes that patient has not had syncopal episodes until episodes of heavy bleeding began.  Per daughter and patient  there has been no associated n/v/d/abdominal pain/ blood in stools or black stools.  She notes no sob or chest  pain, cough ,fever or chills.   ED Course:  Vitals: afeb, bp 121/73, hr 71 rr 17 sat 100% on ra  Wbc 12.2, hgb 8 down form 9.9, 14,plt 399 mcv 99.6 Na 137, K 3.6,  bicarb 19, glu 187, cr 1.63 ( 1.82) Review of Systems: As per HPI otherwise 10 point review of systems negative.   Past Medical History:  Diagnosis Date   Anemia    Arthritis    Diabetes mellitus without complication (HCC)    Hypertension    Stroke [facility])     Past Surgical History:  Procedure Laterality Date   CESAREAN SECTION     TEE WITHOUT CARDIOVERSION N/A 08/10/2015   Procedure: TRANSESOPHAGEAL ECHOCARDIOGRAM (TEE);  Surgeon: Chrystie Nose, MD;  Location: [facility]  ENDOSCOPY;  Service: Cardiovascular;  Laterality: N/A;     reports that she has never smoked. She has never used smokeless tobacco. She reports that she does not drink alcohol and does not use drugs.  Allergies  Allergen Reactions   Lisinopril Cough    Family History  Problem Relation Age of Onset   Diabetes Mother    Heart disease Mother    Hypertension Mother    Hypertension Father    Stroke Father    Stroke Maternal Grandmother     Prior to Admission medications   Medication Sig Start Date End Date Taking? Authorizing Provider  Alcohol Swabs (ALCOHOL PREP) PADS Use to check blood sugar daily. E11.65 09/16/15   Valarie Cones, Dema Severin, PA-C  amLODipine (NORVASC) 10 MG tablet Take 1 tablet (10 mg total) by mouth daily. Patient taking differently: Take 5 mg by mouth daily. 05/23/16   Marvel Plan, MD  atorvastatin (LIPITOR) 80 MG tablet TAKE 1 TABLET (80 MG TOTAL) BY MOUTH DAILY AT 6 PM. 10/22/17   Marvel Plan, MD  blood glucose meter kit and supplies KIT Use daily to check blood sugar. E11.65 09/16/15   Weber, Dema Severin, PA-C  Blood Pressure Monitoring (PREMIUM AUTOMATIC BP MONITOR) DEVI 1 Device by Does not apply route daily. 08/17/15   Weber, Dema Severin, PA-C  citalopram (CELEXA) 20 MG tablet  03/30/20   [provider]  clopidogrel (PLAVIX) 75 MG tablet Take 1 tablet (75  mg total) by mouth daily. 05/21/16   Penny Pia, MD  donepezil (ARICEPT) 10 MG tablet Take 1 tablet (10 mg total) by mouth at bedtime. 07/23/17   Marvel Plan, MD  empagliflozin (JARDIANCE) 25 MG TABS tablet Take 25 mg by mouth daily.    [provider]  folic acid (FOLVITE) 1 MG tablet TAKE 1 TABLET BY MOUTH EVERY MORNING *PATIENT NEEDS APPOINTMENT* 06/21/23   Ihor Austin, NP  glucose blood test strip Use to check blood sugar daily. E11.65 09/16/15   Valarie Cones, Dema Severin, PA-C  GLYXAMBI 10-5 MG TABS  01/18/21   [provider]  Iron-FA-B Cmp-C-Biot-Probiotic (FUSION PLUS) CAPS  05/14/20   [provider]   Lancet Devices (LANCING DEVICE) MISC Use daily to check blood sugar. E11.65 09/16/15   Morrell Riddle, PA-C  Lancets MISC Use to check blood sugar daily. E11.65 09/16/15   Valarie Cones, Dema Severin, PA-C  megestrol (MEGACE) 40 MG tablet Take 1 tablet (40 mg total) by mouth daily. Can increase to twice a day in the event of heavy bleeding Patient not taking: Reported on 05/08/2022 02/09/20   Constant, Peggy, MD  ondansetron (ZOFRAN) 4 MG tablet Take 1 tablet (4 mg total) by mouth every 6 (six) hours. 07/05/22   Carroll Sage, PA-C  valsartan-hydrochlorothiazide (DIOVAN-HCT) 320-12.5 MG tablet Take 1 tablet by mouth daily.     [provider]    Physical Exam: Vitals:   06/22/23 0300 06/22/23 0315 06/22/23 0330 06/22/23 0500  BP: (!) 127/57 125/60 (!) 104/91 118/60  Pulse: 65 66 65 67  Resp: (!) 23 (!) 23 20 16   Temp:      SpO2: 100% 100% 99% 99%    Constitutional: NAD, calm, comfortable Vitals:   06/22/23 0300 06/22/23 0315 06/22/23 0330 06/22/23 0500  BP: (!) 127/57 125/60 (!) 104/91 118/60  Pulse: 65 66 65 67  Resp: (!) 23 (!) 23 20 16   Temp:      SpO2: 100% 100% 99% 99%   Eyes: PERRL, lids and conjunctivae normal ENMT: Mucous membranes are moist. Posterior pharynx clear of any exudate or lesions.Normal dentition.  Neck: normal, supple, no masses, no thyromegaly Respiratory: clear to auscultation bilaterally, no wheezing, no crackles. Normal respiratory effort. No accessory muscle use.  Cardiovascular: Regular rate and rhythm, no murmurs / rubs / gallops. No extremity edema. 2+ pedal pulses. No carotid bruits.  Abdomen: no tenderness, no masses palpated. No hepatosplenomegaly. Bowel sounds positive.  Musculoskeletal: no clubbing / cyanosis. No joint deformity upper and lower extremities. Good ROM, no contractures. Normal muscle tone.  Skin: no rashes, lesions, ulcers. No induration Neurologic: CN 2-12 grossly intact. Sensation intact, . Strength 5/5 in all 4.  Psychiatric: flat  affect, Alert and oriented to place and self. Normal mood.    Labs on Admission: I have personally reviewed following labs and imaging studies  CBC: Recent Labs  Lab 06/20/23 0322 06/22/23 0131  WBC 11.3* 12.2*  NEUTROABS 8.0*  --   HGB 9.9* 8.0*  HCT 31.8* 25.6*  MCV 98.1 99.6  PLT 413* 399   Basic Metabolic Panel: Recent Labs  Lab 06/20/23 0322 06/22/23 0131  NA 138 137  K 3.7 3.6  CL 107 109  CO2 19* 19*  GLUCOSE 205* 187*  BUN 31* 27*  CREATININE 1.82* 1.63*  CALCIUM 8.4* 8.1*   GFR: Estimated Creatinine Clearance: 44.4 mL/min (A) (by C-G formula based on SCr of 1.63 mg/dL (H)). Liver Function Tests: No results for input(s): "AST", "ALT", "  ALKPHOS", "BILITOT", "PROT", "ALBUMIN" in the last 168 hours. No results for input(s): "LIPASE", "AMYLASE" in the last 168 hours. No results for input(s): "AMMONIA" in the last 168 hours. Coagulation Profile: No results for input(s): "INR", "PROTIME" in the last 168 hours. Cardiac Enzymes: No results for input(s): "CKTOTAL", "CKMB", "CKMBINDEX", "TROPONINI" in the last 168 hours. BNP (last 3 results) No results for input(s): "PROBNP" in the last 8760 hours. HbA1C: No results for input(s): "HGBA1C" in the last 72 hours. CBG: No results for input(s): "GLUCAP" in the last 168 hours. Lipid Profile: No results for input(s): "CHOL", "HDL", "LDLCALC", "TRIG", "CHOLHDL", "LDLDIRECT" in the last 72 hours. Thyroid Function Tests: No results for input(s): "TSH", "T4TOTAL", "FREET4", "T3FREE", "THYROIDAB" in the last 72 hours. Anemia Panel: No results for input(s): "VITAMINB12", "FOLATE", "FERRITIN", "TIBC", "IRON", "RETICCTPCT" in the last 72 hours. Urine analysis:    Component Value Date/Time   COLORURINE AMBER (A) 07/05/2022 0530   APPEARANCEUR CLOUDY (A) 07/05/2022 0530   LABSPEC 1.028 07/05/2022 0530   PHURINE 5.0 07/05/2022 0530   GLUCOSEU 150 (A) 07/05/2022 0530   GLUCOSEU >=1000 08/04/2010 0833   HGBUR NEGATIVE  07/05/2022 0530   BILIRUBINUR NEGATIVE 07/05/2022 0530   BILIRUBINUR neg 03/22/2015 1032   KETONESUR NEGATIVE 07/05/2022 0530   PROTEINUR 100 (A) 07/05/2022 0530   UROBILINOGEN 0.2 08/07/2015 1754   NITRITE NEGATIVE 07/05/2022 0530   LEUKOCYTESUR NEGATIVE 07/05/2022 0530    Radiological Exams on Admission: No results found.  EKG: Independently reviewed.   Assessment/Plan  Symptomatic anemia with associated Orthostatic Syncope -anemia due to  severe menorrhagia  - patient with hx of fibroids with unusually heavy bleeding  that began 3-4 days ago with associated onset of syncopal events.  - syncope due to orthostasis in setting of volume loss and well as medication side-effect  -noted drop in hgb to 8 from 10 over the last 48 hours , prior hgb 14 a year prior  -will type and cross and transfuse 1 unit prbc  -will  check orthostatic bp s/p transfusion   CKDIIIb -at baseline  DMII -iss/fs  -check A1c    Hx of  CVA with residual memory deficits   Uterine fibroids with severe menorrhagia -continue on Aygestin  -will need f/u with Obgyn on discharge    DVT prophylaxis: scd Code Status: full/ as discussed per patient wishes in event of cardiac arrest  Family Communication: [place],Grady (Other) [phone] (Mobile)  Disposition Plan: patient  expected to be admitted greater than 2 midnights  Consults called: n/a Admission status: med tele   Lurline Del MD [facility]   If 7PM-7AM, please contact night-coverage [email or web address] Password [facility]  06/22/2023, 5:44 AM

## 2023-06-22 NOTE — ED Notes (Signed)
ED TO INPATIENT HANDOFF REPORT  ED Nurse Name and Phone #: Idelle Crouch Name/Age/Gender Annette Munoz 57 y.o. female Room/Bed: 039C/039C  Code Status   Code Status: Full Code  Home/SNF/Other Home Patient oriented to: self, place, time, and situation Is this baseline? Yes   Triage Complete: Triage complete  Chief Complaint Syncope [R55]  Triage Note Pt is coming in for vasovagal syncope, she was just seen at [facility] for same, husband was getting her off couch and she vagaled down and allegedly passed out. Medic arrived to husband gently smacking her face to wake her up. She has no complaints at this time. Pt complains of dizziness at this time when she stands up.  Medic vitals   143/58 68hr 14rr 100%ra 224bgl   Allergies Allergies  Allergen Reactions   Lisinopril Cough    Level of Care/Admitting Diagnosis ED Disposition     ED Disposition  Admit   Condition  --   Comment  Hospital Area: [facility] [[id number]]  Level of Care: Telemetry Medical [104]  May admit patient to [facility] or [name] if equivalent level of care is available:: No  Covid Evaluation: Asymptomatic - no recent exposure (last 10 days) testing not required  Diagnosis: Syncope [206001]  Admitting Physician: Lurline Del [[id number]]  Attending Physician: Lurline Del [[id number]]  Certification:: I certify this patient will need inpatient services for at least 2 midnights  Expected Medical Readiness: 06/25/2023          B Medical/Surgery History Past Medical History:  Diagnosis Date   Anemia    Arthritis    Diabetes mellitus without complication (HCC)    Hypertension    Stroke [facility])    Past Surgical History:  Procedure Laterality Date   CESAREAN SECTION     TEE WITHOUT CARDIOVERSION N/A 08/10/2015   Procedure: TRANSESOPHAGEAL ECHOCARDIOGRAM (TEE);  Surgeon: Chrystie Nose, MD;  Location: [facility] ENDOSCOPY;  Service: Cardiovascular;  Laterality: N/A;      A IV Location/Drains/Wounds Patient Lines/Drains/Airways Status     Active Line/Drains/Airways     Name Placement date Placement time Site Days   Peripheral IV 06/22/23 20 G 1" Anterior;Proximal;Right Forearm 06/22/23  0400  Forearm  less than 1            Intake/Output Last 24 hours  Intake/Output Summary (Last 24 hours) at 06/22/2023 0710 Last data filed at 06/22/2023 0550 Gross per 24 hour  Intake 500 ml  Output --  Net 500 ml    Labs/Imaging Results for orders placed or performed during the hospital encounter of 06/22/23 (from the past 48 hour(s))  Basic metabolic panel     Status: Abnormal   Collection Time: 06/22/23  1:31 AM  Result Value Ref Range   Sodium 137 135 - 145 mmol/L   Potassium 3.6 3.5 - 5.1 mmol/L   Chloride 109 98 - 111 mmol/L   CO2 19 (L) 22 - 32 mmol/L   Glucose, Bld 187 (H) 70 - 99 mg/dL    Comment: Glucose reference range applies only to samples taken after fasting for at least 8 hours.   BUN 27 (H) 6 - 20 mg/dL   Creatinine, Ser 2.13 (H) 0.44 - 1.00 mg/dL   Calcium 8.1 (L) 8.9 - 10.3 mg/dL   GFR, Estimated 37 (L) >60 mL/min    Comment: (NOTE) Calculated using the CKD-EPI Creatinine Equation (2021)    Anion gap 9 5 - 15    Comment: Performed at  [facility], [address], [place], [place] [address]  CBC     Status: Abnormal   Collection Time: 06/22/23  1:31 AM  Result Value Ref Range   WBC 12.2 (H) 4.0 - 10.5 K/uL   RBC 2.57 (L) 3.87 - 5.11 MIL/uL   Hemoglobin 8.0 (L) 12.0 - 15.0 g/dL   HCT 72.5 (L) 36.6 - 44.0 %   MCV 99.6 80.0 - 100.0 fL   MCH 31.1 26.0 - 34.0 pg   MCHC 31.3 30.0 - 36.0 g/dL   RDW 34.7 42.5 - 95.6 %   Platelets 399 150 - 400 K/uL   nRBC 0.0 0.0 - 0.2 %    Comment: Performed at [facility], [address] [place], [place] [address]   No results found.  Pending Labs Unresulted Labs (From admission, onward)     Start     Ordered   06/23/23 0500  Comprehensive metabolic panel  Tomorrow morning,    R        06/22/23 0626   06/23/23 0500  CBC  Tomorrow morning,   R        06/22/23 0626   06/22/23 0636  ABO/Rh  Once,   R        06/22/23 0636   06/22/23 0628  Type and screen [facility]  (Blood Administration Adult)  Once,   R       Comments: [facility]    06/22/23 0627   06/22/23 4332  Prepare RBC (crossmatch)  (Blood Administration Adult)  Once,   R       Question Answer Comment  # of Units 1 unit   Transfusion Indications Hemoglobin 8 gm/dL or less and orthopedic or cardiac surgery or pre-existing cardiac condition   Number of Units to Keep Ahead NO units ahead   If emergent release call blood bank Not emergent release      06/22/23 0627   06/22/23 0628  Hemoglobin and hematocrit, blood  Now then every 12 hours,   R (with TIMED occurrences)      06/22/23 0627   06/22/23 0627  Hemoglobin A1c  Once,   R        06/22/23 0626   06/22/23 0627  TSH  Once,   R        06/22/23 0626   06/22/23 0624  Vitamin B12  (Anemia Panel (PNL))  Once,   R        06/22/23 0626   06/22/23 0624  Folate  (Anemia Panel (PNL))  Once,   R        06/22/23 0626   06/22/23 0624  Iron and TIBC  (Anemia Panel (PNL))  Once,   R        06/22/23 0626   06/22/23 0624  Ferritin  (Anemia Panel (PNL))  Once,   R        06/22/23 0626   06/22/23 0624  Reticulocytes  (Anemia Panel (PNL))  Once,   R        06/22/23 0626   06/22/23 0624  HIV Antibody (routine testing w rflx)  (HIV Antibody (Routine testing w reflex) panel)  Once,   R        06/22/23 0626   06/22/23 0624  Hemoglobin A1c  (Glycemic Control (SSI)  Q 4 Hours / Glycemic Control (SSI)  AC +/- HS)  Once,   R       Comments: To assess prior  glycemic control    06/22/23 0626   06/22/23 0109  Urinalysis, Routine w reflex microscopic -Urine, Clean Catch  Once,   URGENT       Question:  Specimen Source  Answer:  Urine, Clean Catch   06/22/23 0108            Vitals/Pain Today's Vitals   06/22/23 0315 06/22/23  0330 06/22/23 0500 06/22/23 0545  BP: 125/60 (!) 104/91 118/60 110/62  Pulse: 66 65 67 63  Resp: (!) 23 20 16 20   Temp:    98.1 F (36.7 C)  TempSrc:    Oral  SpO2: 100% 99% 99% 96%  PainSc:        Isolation Precautions No active isolations  Medications Medications  atorvastatin (LIPITOR) tablet 40 mg (has no administration in time range)  citalopram (CELEXA) tablet 20 mg (has no administration in time range)  folic acid (FOLVITE) tablet 1 mg (has no administration in time range)  insulin aspart (novoLOG) injection 0-6 Units (has no administration in time range)  0.9 %  sodium chloride infusion (has no administration in time range)  acetaminophen (TYLENOL) tablet 650 mg (has no administration in time range)    Or  acetaminophen (TYLENOL) suppository 650 mg (has no administration in time range)  ondansetron (ZOFRAN) tablet 4 mg (has no administration in time range)    Or  ondansetron (ZOFRAN) injection 4 mg (has no administration in time range)  albuterol (PROVENTIL) (2.5 MG/3ML) 0.083% nebulizer solution 2.5 mg (has no administration in time range)  0.9 %  sodium chloride infusion (Manually program via Guardrails IV Fluids) (has no administration in time range)  sodium chloride 0.9 % bolus 500 mL (0 mLs Intravenous Stopped 06/22/23 0550)    Mobility walks with person assist     Focused Assessments Cardiac Assessment Handoff:    No results found for: "CKTOTAL", "CKMB", "CKMBINDEX", "TROPONINI" No results found for: "DDIMER" Does the Patient currently have chest pain? No    R Recommendations: See Admitting Provider Note  Report given to:   Additional Notes: alert and oriented.

## 2023-06-22 NOTE — ED Provider Notes (Signed)
EMERGENCY DEPARTMENT AT [facility] Provider Note   CSN: [id number] Arrival date & time: 06/22/23  0102     History  Chief Complaint  Patient presents with   Near Syncope    Annette Munoz is a 57 y.o. female.  57 year old female with a history of diabetes, hypertension, CVA, anemia presents to the emergency department for recurrent syncope and near syncope.  The history is provided primarily by spouse and daughter at bedside.  They report that the patient began experiencing vaginal bleeding about 2 weeks ago.  This has felt to be associated with her uterine fibroids.  Over the past 3 days the patient has been experiencing episodes of lightheadedness and syncope when changing position; primarily when going from a supine to seated or seated to standing position.  Family has noted most of the episodes to occur when patient is being awoken at nighttime to use the bathroom.  It will take her a few minutes before she is able to return to baseline.  She was found to have a drop in her hemoglobin when assessed at OSH yesterday.  Was started on Aygestin during this visit and daughter reports that the vaginal bleeding has since ceased; however, the episodes of syncope have persisted occurring most recently tonight around 2330.  She has no associated CP, SOB, seizure like activity, incontinence.  Of note, the patient was changed from Valsartan to Carvedilol by her PCP 2 weeks ago.  She has been compliant with her Norvasc.  The history is provided by a relative and the spouse. No language interpreter was used.  Near Syncope       Home Medications Prior to Admission medications   Medication Sig Start Date End Date Taking? Authorizing Provider  Alcohol Swabs (ALCOHOL PREP) PADS Use to check blood sugar daily. E11.65 09/16/15   Valarie Cones, Dema Severin, PA-C  amLODipine (NORVASC) 10 MG tablet Take 1 tablet (10 mg total) by mouth daily. Patient taking differently: Take 5 mg by mouth daily.  05/23/16   Marvel Plan, MD  atorvastatin (LIPITOR) 80 MG tablet TAKE 1 TABLET (80 MG TOTAL) BY MOUTH DAILY AT 6 PM. 10/22/17   Marvel Plan, MD  blood glucose meter kit and supplies KIT Use daily to check blood sugar. E11.65 09/16/15   Weber, Dema Severin, PA-C  Blood Pressure Monitoring (PREMIUM AUTOMATIC BP MONITOR) DEVI 1 Device by Does not apply route daily. 08/17/15   Weber, Dema Severin, PA-C  citalopram (CELEXA) 20 MG tablet  03/30/20   [provider]  clopidogrel (PLAVIX) 75 MG tablet Take 1 tablet (75 mg total) by mouth daily. 05/21/16   Penny Pia, MD  donepezil (ARICEPT) 10 MG tablet Take 1 tablet (10 mg total) by mouth at bedtime. 07/23/17   Marvel Plan, MD  empagliflozin (JARDIANCE) 25 MG TABS tablet Take 25 mg by mouth daily.    [provider]  folic acid (FOLVITE) 1 MG tablet TAKE 1 TABLET BY MOUTH EVERY MORNING *PATIENT NEEDS APPOINTMENT* 06/21/23   Ihor Austin, NP  glucose blood test strip Use to check blood sugar daily. E11.65 09/16/15   Valarie Cones, Dema Severin, PA-C  GLYXAMBI 10-5 MG TABS  01/18/21   [provider]  Iron-FA-B Cmp-C-Biot-Probiotic (FUSION PLUS) CAPS  05/14/20   [provider]  Lancet Devices (LANCING DEVICE) MISC Use daily to check blood sugar. E11.65 09/16/15   Morrell Riddle, PA-C  Lancets MISC Use to check blood sugar daily. E11.65 09/16/15   Morrell Riddle,  PA-C  megestrol (MEGACE) 40 MG tablet Take 1 tablet (40 mg total) by mouth daily. Can increase to twice a day in the event of heavy bleeding Patient not taking: Reported on 05/08/2022 02/09/20   Constant, Peggy, MD  ondansetron (ZOFRAN) 4 MG tablet Take 1 tablet (4 mg total) by mouth every 6 (six) hours. 07/05/22   Carroll Sage, PA-C  valsartan-hydrochlorothiazide (DIOVAN-HCT) 320-12.5 MG tablet Take 1 tablet by mouth daily.     [provider]      Allergies    Lisinopril    Review of Systems   Review of Systems  Unable to perform ROS: Other  Cardiovascular:  Positive for  near-syncope.  Ten systems reviewed and are negative for acute change, except as noted in the HPI.     Physical Exam Updated Vital Signs BP 118/60   Pulse 67   Temp 98.1 F (36.7 C)   Resp 16   SpO2 99%   Physical Exam Vitals and nursing note reviewed.  Constitutional:      General: She is not in acute distress.    Appearance: She is well-developed. She is not diaphoretic.     Comments: Obese AA female  HENT:     Head: Normocephalic and atraumatic.  Eyes:     General: No scleral icterus.    Extraocular Movements: EOM normal.     Conjunctiva/sclera: Conjunctivae normal.  Pulmonary:     Effort: Pulmonary effort is normal. No respiratory distress.     Comments: Respirations even and unlabored Musculoskeletal:        General: Normal range of motion.     Cervical back: Normal range of motion.  Skin:    General: Skin is warm and dry.     Coloration: Skin is not pale.     Findings: No erythema or rash.  Neurological:     Mental Status: She is alert.     Comments: Alert, moving all extremities spontaneously.  Psychiatric:        Mood and Affect: Mood and affect normal.        Behavior: Behavior is cooperative.     ED Results / Procedures / Treatments   Labs (all labs ordered are listed, but only abnormal results are displayed) Labs Reviewed  BASIC METABOLIC PANEL - Abnormal; Notable for the following components:      Result Value   CO2 19 (*)    Glucose, Bld 187 (*)    BUN 27 (*)    Creatinine, Ser 1.63 (*)    Calcium 8.1 (*)    GFR, Estimated 37 (*)    All other components within normal limits  CBC - Abnormal; Notable for the following components:   WBC 12.2 (*)    RBC 2.57 (*)    Hemoglobin 8.0 (*)    HCT 25.6 (*)    All other components within normal limits  URINALYSIS, ROUTINE W REFLEX MICROSCOPIC    EKG    Radiology No results found.  Procedures Procedures    Medications Ordered in ED Medications  sodium chloride 0.9 % bolus 500 mL (500 mLs  Intravenous New Bag/Given 06/22/23 0400)    ED Course/ Medical Decision Making/ A&P Clinical Course as of 06/22/23 0538  Fri Jun 22, 2023  0448 Spoke with Dr. Maisie Fus of [facility] who will assess the patient in the ED for admission. [KH]    Clinical Course User Index [KH] Antony Madura, PA-C  Medical Decision Making Amount and/or Complexity of Data Reviewed Labs: ordered.  Risk Decision regarding hospitalization.   This patient presents to the ED for concern of recurrent syncope, this involves an extensive number of treatment options, and is a complaint that carries with it a high risk of complications and morbidity.  The differential diagnosis includes hypovolemia vs arrhythmia vs seizure vs hypoglycemia   Co morbidities that complicate the patient evaluation  DM HTN CVA   Additional history obtained:  Additional history obtained from spouse and daughter, at bedside External records from outside source obtained and reviewed including Hgb of 9.9 from 2 days ago.   Lab Tests:  I Ordered, and personally interpreted labs.  The pertinent results include:  Hgb 8.0, creatinine 1.63 (stable and c/w baseline), WBC 12.2.   Cardiac Monitoring:  The patient was maintained on a cardiac monitor.  I personally viewed and interpreted the cardiac monitored which showed an underlying rhythm of: NSR   Medicines ordered and prescription drug management:  I ordered medication including IVF for hydration  Reevaluation of the patient after these medicines showed that the patient  remained stable I have reviewed the patients home medicines and have made adjustments as needed   Test Considered:  Orthostatic vital signs.   Problem List / ED Course:  Patient presenting to the emergency department for persistent syncope.  She has been experiencing recurrent syncope over the past 3 days preceded by lightheadedness.  Syncope aggravated by change in position, very  characteristic of orthostasis.  The patient has been experiencing ongoing vaginal bleeding for the past 2 weeks associated with a uterine fibroid.  She was started on Aygestin yesterday which has helped nearly resolved the vaginal bleeding; however, she has also had a 2 g hemoglobin drop in the past 48 hours.  As an aside, family reports that she was changed from valsartan to carvedilol 2 weeks ago. I think this patient's symptoms are multifactorial.  It is likely that her dysfunctional uterine bleeding is contributing to a degree of hypovolemia making her more prone to orthostatic hypotension.  The addition of a beta-blocker, I suspect, is also limiting her compensatory response with position change making her syncopal episodes occur more frequently.  As the patient is already on Norvasc for blood pressure control, have recommended to the family that she discontinue use of her beta-blocker.  Will admit for observation on telemetry to ensure no other complicating features contributing to patient's recurrent syncopal episodes.  Case discussed with Dr. Maisie Fus who will admit.   Reevaluation:  After the interventions noted above, I reevaluated the patient and found that they have :stayed the same   Social Determinants of Health:  Good social support Daily assistance with ADLs   Dispostion:  After consideration of the diagnostic results and the patients response to treatment, I feel that the patent would benefit from observation admission for recurrent syncope. Hospitalist to assess in the ED.          Final Clinical Impression(s) / ED Diagnoses Final diagnoses:  Recurrent syncope  DUB (dysfunctional uterine bleeding)    Rx / DC Orders ED Discharge Orders     None         Antony Madura, PA-C 06/22/23 0554    Zadie Rhine, MD 06/22/23 [phone]

## 2023-06-23 DIAGNOSIS — R55 Syncope and collapse: Secondary | ICD-10-CM | POA: Diagnosis not present

## 2023-06-23 LAB — GLUCOSE, CAPILLARY
Glucose-Capillary: 184 mg/dL — ABNORMAL HIGH (ref 70–99)
Glucose-Capillary: 217 mg/dL — ABNORMAL HIGH (ref 70–99)
Glucose-Capillary: 158 mg/dL — ABNORMAL HIGH (ref 70–99)
Glucose-Capillary: 265 mg/dL — ABNORMAL HIGH (ref 70–99)

## 2023-06-23 LAB — COMPREHENSIVE METABOLIC PANEL
ALT: 17 U/L (ref 0–44)
AST: 15 U/L (ref 15–41)
Albumin: 3.1 g/dL — ABNORMAL LOW (ref 3.5–5.0)
Alkaline Phosphatase: 61 U/L (ref 38–126)
Anion gap: 7 (ref 5–15)
BUN: 18 mg/dL (ref 6–20)
CO2: 18 mmol/L — ABNORMAL LOW (ref 22–32)
Calcium: 8.3 mg/dL — ABNORMAL LOW (ref 8.9–10.3)
Chloride: 110 mmol/L (ref 98–111)
Creatinine, Ser: 1.48 mg/dL — ABNORMAL HIGH (ref 0.44–1.00)
GFR, Estimated: 41 mL/min — ABNORMAL LOW (ref >60)
Glucose, Bld: 204 mg/dL — ABNORMAL HIGH (ref 70–99)
Potassium: 3.7 mmol/L (ref 3.5–5.1)
Sodium: 135 mmol/L (ref 135–145)
Total Bilirubin: 0.5 mg/dL (ref 0.3–1.2)
Total Protein: 6.2 g/dL — ABNORMAL LOW (ref 6.5–8.1)

## 2023-06-23 LAB — CBC
HCT: 33 % — ABNORMAL LOW (ref 36.0–46.0)
Hemoglobin: 10.2 g/dL — ABNORMAL LOW (ref 12.0–15.0)
MCH: 30.9 pg (ref 26.0–34.0)
MCHC: 30.9 g/dL (ref 30.0–36.0)
MCV: 100 fL (ref 80.0–100.0)
Platelets: 346 10*3/uL (ref 150–400)
RBC: 3.3 MIL/uL — ABNORMAL LOW (ref 3.87–5.11)
RDW: 16.5 % — ABNORMAL HIGH (ref 11.5–15.5)
WBC: 10.9 10*3/uL — ABNORMAL HIGH (ref 4.0–10.5)
nRBC: 0 % (ref 0.0–0.2)

## 2023-06-23 LAB — TYPE AND SCREEN
ABO/RH(D): O POS
Antibody Screen: NEGATIVE
Unit division: 0

## 2023-06-23 LAB — PHOSPHORUS: Phosphorus: 2.1 mg/dL — ABNORMAL LOW (ref 2.5–4.6)

## 2023-06-23 LAB — BPAM RBC
Blood Product Expiration Date: 202409262359
ISSUE DATE / TIME: 202409061104
Unit Type and Rh: 5100

## 2023-06-23 LAB — MAGNESIUM: Magnesium: 1.9 mg/dL (ref 1.7–2.4)

## 2023-06-23 MED ORDER — HYDROCORTISONE 1 % EX CREA
1.0000 | TOPICAL_CREAM | Freq: Three times a day (TID) | CUTANEOUS | Status: DC | PRN
  Administered 2023-06-23 – 2023-06-24 (×2): 1 via TOPICAL
  Filled 2023-06-23 (×2): qty 28

## 2023-06-23 NOTE — Progress Notes (Signed)
PROGRESS NOTE    Annette Munoz  NWG:[id number] DOB: September 30, 1966 DOA: 06/22/2023 PCP: Lewis Moccasin, MD   Brief Narrative:  This 57 years old female with PMH significant for type 2 diabetes, hypertension, CVA with residual memory deficits, fibroids, heavy menses, anemia presented in the ED for the second time in 1 week with episode of syncope. Patient  was initially seen on 9/4 with an episode of vasovagal syncope in the shower in the setting of heavy menstruation.  Patient was evaluated and discharged home.  atient had another episode and was taken to the [facility] where she was treated with Aygestin which as per daughter stopped the bleeding.  As per daughter these episodes continue to happen with position change.  She had an episode where she lost consciousness briefly and then returned to baseline. Patient was also recently started on carvedilol 12 weeks ago.  Patient is found to have hemoglobin 7.3.  Patient was admitted for further evaluation.  Patient is being given 1 unit of packed red blood cells for anemia.  She reports vaginal bleeding has stopped.  Patient was seen and examined at bedside she feels improved.  Assessment & Plan:   Principal Problem:   Syncope  Symptomatic anemia with associated orthostatic hypotension: Patient presented with syncopal episode in the setting of severe menorrhagia. Patient has history of fibroids with unusually heavy bleeding that began 2 to 3 days ago with associated syncopal event. Likely due to volume loss as well as medication side effects. There is a noted drop in hemoglobin from 10-8 in 48 hours. Status post 1 unit PRBC.  Hemoglobin ranges above 8. Blood pressure has improved.  Monitor H&H daily.  CKDIIIb Serum creatinine at baseline. Avoid nephrotoxic medications.   DMII Carb modified diet. Start regular insulin sliding scale.   Hx. of  CVA with residual memory deficits: Continue Lipitor. No residual deficits.  Uterine fibroids with  severe menorrhagia Continue on Aygestin  Need outpatient follow-up with OB/GYN.   DVT prophylaxis:SCDs Code Status: Full code Family Communication: Daughter at bed side. Disposition Plan:     Status is: Inpatient Remains inpatient appropriate because: Admitted for symptomatic anemia with syncopal episode.   Patient required 1 unit of PRBC hemoglobin is stable.   Consultants:  None  Procedures: None  Antimicrobials:  Anti-infectives (From admission, onward)    None      Subjective: Patient was seen and examined at bedside.  Overnight events noted.   Patient reports doing better.  She still feeling lightheaded but denies any chest pain or shortness of breath or palpitations. She reports bleeding has stopped.  Objective: Vitals:   06/23/23 0120 06/23/23 0434 06/23/23 0741 06/23/23 1246  BP: (!) 132/55 129/61 125/64 (!) 117/58  Pulse: (!) 57  65 64  Resp:  16 16 17   Temp:   98.9 F (37.2 C) 99.1 F (37.3 C)  TempSrc:   Oral Oral  SpO2:  97% 100% 99%    Intake/Output Summary (Last 24 hours) at 06/23/2023 1308 Last data filed at 06/23/2023 0419 Gross per 24 hour  Intake 1220.63 ml  Output 400 ml  Net 820.63 ml   There were no vitals filed for this visit.  Examination:  General exam: Appears calm and comfortable, not in any acute distress. Respiratory system: Clear to auscultation. Respiratory effort normal.  RR 16 Cardiovascular system: S1 & S2 heard, RRR. No JVD, murmurs, rubs, gallops or clicks. No pedal edema. Gastrointestinal system: Abdomen is non distended, soft and non tender.  Normal bowel sounds heard. Central nervous system: Alert and oriented x3. No focal neurological deficits. Extremities: Symmetric 5 x 5 power. Skin: No rashes, lesions or ulcers Psychiatry: Judgement and insight appear normal. Mood & affect appropriate.     Data Reviewed: I have personally reviewed following labs and imaging studies  CBC: Recent Labs  Lab 06/20/23 0322  06/22/23 0131 06/22/23 0706 06/22/23 1803 06/23/23 1058  WBC 11.3* 12.2*  --   --  10.9*  NEUTROABS 8.0*  --   --   --   --   HGB 9.9* 8.0* 7.2* 8.9* 10.2*  HCT 31.8* 25.6* 23.3* 27.8* 33.0*  MCV 98.1 99.6  --   --  100.0  PLT 413* 399  --   --  346   Basic Metabolic Panel: Recent Labs  Lab 06/20/23 0322 06/22/23 0131 06/23/23 1058  NA 138 137 135  K 3.7 3.6 3.7  CL 107 109 110  CO2 19* 19* 18*  GLUCOSE 205* 187* 204*  BUN 31* 27* 18  CREATININE 1.82* 1.63* 1.48*  CALCIUM 8.4* 8.1* 8.3*  MG  --   --  1.9  PHOS  --   --  2.1*   GFR: Estimated Creatinine Clearance: 48.8 mL/min (A) (by C-G formula based on SCr of 1.48 mg/dL (H)). Liver Function Tests: Recent Labs  Lab 06/23/23 1058  AST 15  ALT 17  ALKPHOS 61  BILITOT 0.5  PROT 6.2*  ALBUMIN 3.1*   No results for input(s): "LIPASE", "AMYLASE" in the last 168 hours. No results for input(s): "AMMONIA" in the last 168 hours. Coagulation Profile: No results for input(s): "INR", "PROTIME" in the last 168 hours. Cardiac Enzymes: No results for input(s): "CKTOTAL", "CKMB", "CKMBINDEX", "TROPONINI" in the last 168 hours. BNP (last 3 results) No results for input(s): "PROBNP" in the last 8760 hours. HbA1C: Recent Labs    06/22/23 0706  HGBA1C 6.3*   CBG: Recent Labs  Lab 06/22/23 1606 06/22/23 2140 06/22/23 2230 06/23/23 0807 06/23/23 1247  GLUCAP 248* 237* 232* 184* 217*   Lipid Profile: No results for input(s): "CHOL", "HDL", "LDLCALC", "TRIG", "CHOLHDL", "LDLDIRECT" in the last 72 hours. Thyroid Function Tests: Recent Labs    06/22/23 0706  TSH 2.435   Anemia Panel: Recent Labs    06/22/23 0706  VITAMINB12 471  FOLATE 22.0  FERRITIN 10*  TIBC 308  IRON 21*  RETICCTPCT 5.1*   Sepsis Labs: No results for input(s): "PROCALCITON", "LATICACIDVEN" in the last 168 hours.  No results found for this or any previous visit (from the past 240 hour(s)).   Radiology Studies: No results  found.  Scheduled Meds:  atorvastatin  40 mg Oral Daily   citalopram  20 mg Oral Daily   folic acid  1 mg Oral Daily   influenza vac split trivalent PF  0.5 mL Intramuscular Tomorrow-1000   insulin aspart  0-6 Units Subcutaneous TID WC   norethindrone  10 mg Oral Daily   Continuous Infusions:   LOS: 1 day    Time spent: 50 MINS    Willeen Niece, MD [facility]   If 7PM-7AM, please contact night-coverage

## 2023-06-23 NOTE — Plan of Care (Signed)

## 2023-06-23 NOTE — Progress Notes (Signed)
Per family request, pt sat up to side of the bed to note if any dizziness/presyncopal events would occur. Per daughter, episodes more prevalent at night.   Patient sat up at side of bed, initially no symptoms noted. RN asked the pt to attempt standing to see if pt remains symptom free. Before pt could stand, while legs dangling off side of bed, she became dizzy and eyes fluttered back, but no loc noted. Patient assisted back into bed fully and symptoms completely resolved. No abnormal telemetry alarms during episode. Bed alarm on, family at bedside.

## 2023-06-23 NOTE — Plan of Care (Signed)
  Problem: Education: Goal: Knowledge of General Education information will improve Description: Including pain rating scale, medication(s)/side effects and non-pharmacologic comfort measures Outcome: Progressing   Problem: Clinical Measurements: Goal: Ability to maintain clinical measurements within normal limits will improve Outcome: Progressing Goal: Will remain free from infection Outcome: Progressing Goal: Diagnostic test results will improve Outcome: Progressing   Problem: Nutrition: Goal: Adequate nutrition will be maintained Outcome: Progressing   Problem: Health Behavior/Discharge Planning: Goal: Ability to manage health-related needs will improve Outcome: Not Progressing   Problem: Activity: Goal: Risk for activity intolerance will decrease Outcome: Not Progressing

## 2023-06-23 NOTE — Progress Notes (Signed)
RN ambulated with patient in hallway using walker. Patient became fatigued after 175ft and had to be wheeled back to room. No dizziness/sob/chest pain reported at the time. Patient's fatigued resolved immediately when pt sat down. Bed alarm in place, instructed pt to call for assistance.

## 2023-06-24 ENCOUNTER — Inpatient Hospital Stay (HOSPITAL_COMMUNITY): Payer: Medicare HMO

## 2023-06-24 DIAGNOSIS — R55 Syncope and collapse: Secondary | ICD-10-CM | POA: Diagnosis not present

## 2023-06-24 LAB — CBC
HCT: 30.1 % — ABNORMAL LOW (ref 36.0–46.0)
Hemoglobin: 9.5 g/dL — ABNORMAL LOW (ref 12.0–15.0)
MCH: 30.5 pg (ref 26.0–34.0)
MCHC: 31.6 g/dL (ref 30.0–36.0)
MCV: 96.8 fL (ref 80.0–100.0)
Platelets: 382 10*3/uL (ref 150–400)
RBC: 3.11 MIL/uL — ABNORMAL LOW (ref 3.87–5.11)
RDW: 16 % — ABNORMAL HIGH (ref 11.5–15.5)
WBC: 11.7 10*3/uL — ABNORMAL HIGH (ref 4.0–10.5)
nRBC: 0 % (ref 0.0–0.2)

## 2023-06-24 LAB — GLUCOSE, CAPILLARY
Glucose-Capillary: 164 mg/dL — ABNORMAL HIGH (ref 70–99)
Glucose-Capillary: 134 mg/dL — ABNORMAL HIGH (ref 70–99)
Glucose-Capillary: 166 mg/dL — ABNORMAL HIGH (ref 70–99)
Glucose-Capillary: 146 mg/dL — ABNORMAL HIGH (ref 70–99)
Glucose-Capillary: 185 mg/dL — ABNORMAL HIGH (ref 70–99)

## 2023-06-24 LAB — BASIC METABOLIC PANEL
Anion gap: 7 (ref 5–15)
BUN: 17 mg/dL (ref 6–20)
CO2: 19 mmol/L — ABNORMAL LOW (ref 22–32)
Calcium: 8.3 mg/dL — ABNORMAL LOW (ref 8.9–10.3)
Chloride: 108 mmol/L (ref 98–111)
Creatinine, Ser: 1.48 mg/dL — ABNORMAL HIGH (ref 0.44–1.00)
GFR, Estimated: 41 mL/min — ABNORMAL LOW (ref >60)
Glucose, Bld: 161 mg/dL — ABNORMAL HIGH (ref 70–99)
Potassium: 3.8 mmol/L (ref 3.5–5.1)
Sodium: 134 mmol/L — ABNORMAL LOW (ref 135–145)

## 2023-06-24 LAB — URINALYSIS, ROUTINE W REFLEX MICROSCOPIC
Bacteria, UA: NONE SEEN
Bilirubin Urine: NEGATIVE
Glucose, UA: >=500 mg/dL — AB
Hgb urine dipstick: NEGATIVE
Ketones, ur: NEGATIVE mg/dL
Leukocytes,Ua: NEGATIVE
Nitrite: NEGATIVE
Protein, ur: NEGATIVE mg/dL
Specific Gravity, Urine: 1.022 (ref 1.005–1.030)
pH: 5 (ref 5.0–8.0)

## 2023-06-24 LAB — MAGNESIUM: Magnesium: 1.7 mg/dL (ref 1.7–2.4)

## 2023-06-24 LAB — PHOSPHORUS: Phosphorus: 2.4 mg/dL — ABNORMAL LOW (ref 2.5–4.6)

## 2023-06-24 MED ORDER — DIPHENHYDRAMINE HCL 25 MG PO CAPS
25.0000 mg | ORAL_CAPSULE | Freq: Three times a day (TID) | ORAL | Status: DC | PRN
  Administered 2023-06-24 – 2023-06-25 (×3): 25 mg via ORAL
  Filled 2023-06-24 (×4): qty 1

## 2023-06-24 NOTE — Plan of Care (Signed)
  Problem: Activity: Goal: Risk for activity intolerance will decrease Outcome: Progressing   Problem: Nutrition: Goal: Adequate nutrition will be maintained Outcome: Progressing   Problem: Coping: Goal: Level of anxiety will decrease Outcome: Progressing   Problem: Elimination: Goal: Will not experience complications related to bowel motility Outcome: Progressing Goal: Will not experience complications related to urinary retention Outcome: Progressing   

## 2023-06-24 NOTE — Progress Notes (Signed)
PROGRESS NOTE    Annette Munoz  QIO:[id number] DOB: 10/05/1966 DOA: 06/22/2023 PCP: Lewis Moccasin, MD   Brief Narrative:  This 57 years old female with PMH significant for type 2 diabetes, hypertension, CVA with residual memory deficits, fibroids, heavy menses, anemia presented in the ED for the second time in 1 week with episode of syncope. Patient  was initially seen on 9/4 with an episode of vasovagal syncope in the shower in the setting of heavy menstruation.  Patient was evaluated and discharged home. Patient had another episode and was taken to the [facility] where she was treated with Aygestin which as per daughter stopped the bleeding.  As per daughter these episodes continue to happen with position change.  She had an episode where she lost consciousness briefly and then returned to baseline. Patient was also recently started on carvedilol 12 weeks ago.  Patient is found to have hemoglobin 7.3.  Patient was admitted for further evaluation.  Patient is being given 1 unit of packed red blood cells for anemia.  She reports vaginal bleeding has stopped.  Patient had 3 episodes of blank stare lasting few seconds.  Neurology is consulted , Patient connected with long-term EEG.  Assessment & Plan:   Principal Problem:   Syncope  Symptomatic anemia with associated orthostatic hypotension: Patient presented with syncopal episode in the setting of severe menorrhagia. Patient has history of fibroids with unusually heavy bleeding that began 2 to 3 days ago with associated syncopal event. Likely due to volume loss as well as medication side effects. There is a noted drop in hemoglobin from 10-8 in 48 hours. Status post 1 unit PRBC.  Hemoglobin ranges above 8. Blood pressure has improved.  Monitor H&H daily.  CKD IIIb: Serum creatinine at baseline. Avoid nephrotoxic medications.   Diabetes Mellitus II: Carb modified diet. Continue regular insulin sliding scale.   Hx. of  CVA with residual  memory deficits: Continue Lipitor. No residual deficits.  Uterine fibroids with severe menorrhagia Continue on Aygestin  Need outpatient follow-up with OB/GYN.  R/O seizure: Patient has 3 episodes of blank stare, She has up rolling of eyes lasting few seconds. Neurology is consulted, Patient connected with LT EEG.   DVT prophylaxis:SCDs Code Status: Full code Family Communication: Daughter at bed side. Disposition Plan:     Status is: Inpatient Remains inpatient appropriate because: Admitted for symptomatic anemia with syncopal episode.   Patient required 1 unit of PRBC,  hemoglobin is stable.   Consultants:  None  Procedures: None  Antimicrobials:  Anti-infectives (From admission, onward)    None      Subjective: Patient was seen and examined at bedside.  Overnight events noted.   Patient reports doing better.  Patient has 3 episodes of blank stare where she had a pulling of eyes lasting few seconds. There is concern for having seizure.  Neurology is consulted. She reports bleeding has stopped.  Objective: Vitals:   06/24/23 0336 06/24/23 0500 06/24/23 0727 06/24/23 1202  BP: 134/76  (!) 132/52 (!) 128/43  Pulse: (!) 57  (!) 56 60  Resp: 16     Temp: 98.9 F (37.2 C)  98.2 F (36.8 C) 98.7 F (37.1 C)  TempSrc: Oral  Oral Oral  SpO2: 97%  99% 100%  Weight:  111.3 kg      Intake/Output Summary (Last 24 hours) at 06/24/2023 1308 Last data filed at 06/24/2023 0900 Gross per 24 hour  Intake 240 ml  Output 600 ml  Net -360  ml   Filed Weights   06/24/23 0500  Weight: 111.3 kg    Examination:  General exam: Appears comfortable, not in any acute distress. Respiratory system: CTA bilaterally. Respiratory effort normal.  RR 14 Cardiovascular system: S1 & S2 heard, RRR. No JVD, murmurs, rubs, gallops or clicks. No pedal edema. Gastrointestinal system: Abdomen is non distended, soft and non tender.  Normal bowel sounds heard. Central nervous system: Alert and  oriented x3. No focal neurological deficits. Extremities: Symmetric 5 x 5 power. Skin: No rashes, lesions or ulcers Psychiatry: Judgement and insight appear normal. Mood & affect appropriate.     Data Reviewed: I have personally reviewed following labs and imaging studies  CBC: Recent Labs  Lab 06/20/23 0322 06/22/23 0131 06/22/23 0706 06/22/23 1803 06/23/23 1058 06/24/23 0817  WBC 11.3* 12.2*  --   --  10.9* 11.7*  NEUTROABS 8.0*  --   --   --   --   --   HGB 9.9* 8.0* 7.2* 8.9* 10.2* 9.5*  HCT 31.8* 25.6* 23.3* 27.8* 33.0* 30.1*  MCV 98.1 99.6  --   --  100.0 96.8  PLT 413* 399  --   --  346 382   Basic Metabolic Panel: Recent Labs  Lab 06/20/23 0322 06/22/23 0131 06/23/23 1058 06/24/23 0817  NA 138 137 135 134*  K 3.7 3.6 3.7 3.8  CL 107 109 110 108  CO2 19* 19* 18* 19*  GLUCOSE 205* 187* 204* 161*  BUN 31* 27* 18 17  CREATININE 1.82* 1.63* 1.48* 1.48*  CALCIUM 8.4* 8.1* 8.3* 8.3*  MG  --   --  1.9 1.7  PHOS  --   --  2.1* 2.4*   GFR: Estimated Creatinine Clearance: 50 mL/min (A) (by C-G formula based on SCr of 1.48 mg/dL (H)). Liver Function Tests: Recent Labs  Lab 06/23/23 1058  AST 15  ALT 17  ALKPHOS 61  BILITOT 0.5  PROT 6.2*  ALBUMIN 3.1*   No results for input(s): "LIPASE", "AMYLASE" in the last 168 hours. No results for input(s): "AMMONIA" in the last 168 hours. Coagulation Profile: No results for input(s): "INR", "PROTIME" in the last 168 hours. Cardiac Enzymes: No results for input(s): "CKTOTAL", "CKMB", "CKMBINDEX", "TROPONINI" in the last 168 hours. BNP (last 3 results) No results for input(s): "PROBNP" in the last 8760 hours. HbA1C: Recent Labs    06/22/23 0706  HGBA1C 6.3*   CBG: Recent Labs  Lab 06/23/23 1605 06/23/23 2134 06/24/23 0616 06/24/23 0728 06/24/23 1203  GLUCAP 158* 265* 164* 134* 166*   Lipid Profile: No results for input(s): "CHOL", "HDL", "LDLCALC", "TRIG", "CHOLHDL", "LDLDIRECT" in the last 72  hours. Thyroid Function Tests: Recent Labs    06/22/23 0706  TSH 2.435   Anemia Panel: Recent Labs    06/22/23 0706  VITAMINB12 471  FOLATE 22.0  FERRITIN 10*  TIBC 308  IRON 21*  RETICCTPCT 5.1*   Sepsis Labs: No results for input(s): "PROCALCITON", "LATICACIDVEN" in the last 168 hours.  No results found for this or any previous visit (from the past 240 hour(s)).   Radiology Studies: No results found.  Scheduled Meds:  atorvastatin  40 mg Oral Daily   citalopram  20 mg Oral Daily   folic acid  1 mg Oral Daily   influenza vac split trivalent PF  0.5 mL Intramuscular Tomorrow-1000   insulin aspart  0-6 Units Subcutaneous TID WC   norethindrone  10 mg Oral Daily   Continuous Infusions:   LOS:  2 days    Time spent: 44 MINS    Willeen Niece, MD [facility]   If 7PM-7AM, please contact night-coverage

## 2023-06-24 NOTE — Progress Notes (Signed)
LTM EEG hooked up and running - no initial skin breakdown - push button tested - [facility] monitoring. GMD/NA 

## 2023-06-24 NOTE — Plan of Care (Signed)
°  Problem: Clinical Measurements: Goal: Will remain free from infection Outcome: Progressing   Problem: Education: Goal: Knowledge of General Education information will improve Description: Including pain rating scale, medication(s)/side effects and non-pharmacologic comfort measures Outcome: Not Progressing   Problem: Health Behavior/Discharge Planning: Goal: Ability to manage health-related needs will improve Outcome: Not Progressing   Problem: Clinical Measurements: Goal: Ability to maintain clinical measurements within normal limits will improve Outcome: Not Progressing

## 2023-06-24 NOTE — Progress Notes (Signed)
Pt placed in mitts after multiple attempts to redirect her from scratching at her EEG leads

## 2023-06-24 NOTE — Progress Notes (Signed)
While doing bedside report this am pt had approx 3 episodes of staring off into space. This nurse or her daughter would call her name and then she would startle and come to. Each episode lasting no more than 10 seconds each. Pt is repeating 3 questions over and over. MD made aware

## 2023-06-24 NOTE — Consult Note (Signed)
NEUROLOGY CONSULTATION NOTE   Date of service: June 24, 2023 Patient Name: Annette Munoz MRN:  [id number] DOB:  1966-01-05 Reason for consult: "seizure" Requesting Provider: Willeen Niece, MD _ _ _   _ __   _ __ _ _  __ __   _ __   __ _  History of Present Illness  Annette Munoz is a 57 y.o. female with PMH significant for  has a past medical history of Anemia, Arthritis, Diabetes mellitus without complication (HCC), Hypertension, and Stroke (HCC). who presents with few episodes of near syncope in setting of heavy uterine bleeding. Today has 2-3 episodes of starring off that was brief in nature, but no confusion/sedation after spell. Witnessed by family.  She is dizzy intermittently.  No rhythmic shaking, no biting tongue/lip.  No convulsions.    ROS   Constitutional Denies weight loss, fever and chills.   HEENT Denies changes in vision and hearing.   Respiratory Denies SOB and cough.   CV Denies palpitations and CP   GI Denies abdominal pain, nausea, vomiting and diarrhea.   GU Denies dysuria and urinary frequency.   MSK Denies myalgia and joint pain.   Skin Denies rash and pruritus.   Neurological Denies headache and syncope.   Psychiatric Denies recent changes in mood. Denies anxiety and depression.    Past History   Past Medical History:  Diagnosis Date   Anemia    Arthritis    Diabetes mellitus without complication (HCC)    Hypertension    Stroke [facility])    Past Surgical History:  Procedure Laterality Date   CESAREAN SECTION     TEE WITHOUT CARDIOVERSION N/A 08/10/2015   Procedure: TRANSESOPHAGEAL ECHOCARDIOGRAM (TEE);  Surgeon: Chrystie Nose, MD;  Location: [facility] ENDOSCOPY;  Service: Cardiovascular;  Laterality: N/A;   Family History  Problem Relation Age of Onset   Diabetes Mother    Heart disease Mother    Hypertension Mother    Hypertension Father    Stroke Father    Stroke Maternal Grandmother    Social History   Socioeconomic History   Marital  status: Married    Spouse name: Not on file   Number of children: Not on file   Years of education: Not on file   Highest education level: Not on file  Occupational History   Occupation: disabilaity  Tobacco Use   Smoking status: Never   Smokeless tobacco: Never  Vaping Use   Vaping status: Never Used  Substance and Sexual Activity   Alcohol use: No    Alcohol/week: 1.0 standard drink of alcohol    Types: 1 Shots of liquor per week    Comment: occasionally   Drug use: No   Sexual activity: Yes    Partners: Male  Other Topics Concern   Not on file  Social History Narrative   Lives with husband and daughter   Right Handed      Social Determinants of Health   Financial Resource Strain: Not on file  Food Insecurity: No Food Insecurity (06/22/2023)   Hunger Vital Sign    Worried About Running Out of Food in the Last Year: Never true    Ran Out of Food in the Last Year: Never true  Transportation Needs: No Transportation Needs (06/22/2023)   PRAPARE - Administrator, Civil Service (Medical): No    Lack of Transportation (Non-Medical): No  Physical Activity: Not on file  Stress: Not on file  Social Connections: Unknown (  06/20/2023)   Received from [facility]   Social Network    Social Network: Not on file   Allergies  Allergen Reactions   Lisinopril Cough    Medications   Medications Prior to Admission  Medication Sig Dispense Refill Last Dose   amLODipine (NORVASC) 10 MG tablet Take 1 tablet (10 mg total) by mouth daily. 30 tablet 5 06/20/2023   aspirin EC 81 MG tablet Take 81 mg by mouth daily. Swallow whole.   06/20/2023   atorvastatin (LIPITOR) 80 MG tablet TAKE 1 TABLET (80 MG TOTAL) BY MOUTH DAILY AT 6 PM. (Patient taking differently: Take 40 mg by mouth at bedtime.) 90 tablet 1 06/21/2023   carvedilol (COREG) 6.25 MG tablet Take 6.25 mg by mouth 2 (two) times daily.   06/21/2023   Cholecalciferol (D3 5000 PO) Take by mouth.   06/20/2023   citalopram (CELEXA)  20 MG tablet    06/20/2023   dapagliflozin propanediol (FARXIGA) 10 MG TABS tablet Take 10 mg by mouth daily.   06/20/2023   donepezil (ARICEPT) 10 MG tablet Take 1 tablet (10 mg total) by mouth at bedtime. 90 tablet 3 06/21/2023   folic acid (FOLVITE) 1 MG tablet TAKE 1 TABLET BY MOUTH EVERY MORNING *PATIENT NEEDS APPOINTMENT* 30 tablet 1 06/20/2023   Multiple Vitamin (MULTIVITAMIN) tablet Take 1 tablet by mouth daily.   06/20/2023   Semaglutide (OZEMPIC, 0.25 OR 0.5 MG/DOSE, [place]) Inject 0.5 mg into the skin once a week. Thursdays   06/14/2023   Alcohol Swabs (ALCOHOL PREP) PADS Use to check blood sugar daily. E11.65 100 each 3    blood glucose meter kit and supplies KIT Use daily to check blood sugar. E11.65 1 each 0    Blood Pressure Monitoring (PREMIUM AUTOMATIC BP MONITOR) DEVI 1 Device by Does not apply route daily. 1 Device 0    glucose blood test strip Use to check blood sugar daily. E11.65 100 each 3    Lancet Devices (LANCING DEVICE) MISC Use daily to check blood sugar. E11.65 1 each 0    Lancets MISC Use to check blood sugar daily. E11.65 100 each 3      Vitals   Vitals:   06/24/23 0336 06/24/23 0500 06/24/23 0727 06/24/23 1202  BP: 134/76  (!) 132/52 (!) 128/43  Pulse: (!) 57  (!) 56 60  Resp: 16     Temp: 98.9 F (37.2 C)  98.2 F (36.8 C) 98.7 F (37.1 C)  TempSrc: Oral  Oral Oral  SpO2: 97%  99% 100%  Weight:  111.3 kg       Body mass index is 44.88 kg/m.  Physical Exam   General: Laying comfortably in bed; in no acute distress.  HENT: Normal oropharynx and mucosa. Normal external appearance of ears and nose.  Neck: Supple, no pain or tenderness  CV: No JVD. No peripheral edema.  Pulmonary: Symmetric Chest rise. Normal respiratory effort.  Abdomen: Soft to touch, non-tender.  Ext: No cyanosis, edema, or deformity  Skin: No rash. Normal palpation of skin.   Musculoskeletal: Normal digits and nails by inspection. No clubbing.   Neurologic Examination  Mental  status/Cognition: Alert, oriented to self, place, month and year, good attention.  Speech/language: Fluent, comprehension intact, object naming intact, repetition intact.  Cranial nerves:   CN II Pupils equal and reactive to light, no VF deficits    CN III,IV,VI EOM intact, no gaze preference or deviation, no nystagmus    CN V normal sensation in V1,  V2, and V3 segments bilaterally    CN VII no asymmetry, no nasolabial fold flattening    CN VIII normal hearing to speech    CN IX & X normal palatal elevation, no uvular deviation    CN XI 5/5 head turn and 5/5 shoulder shrug bilaterally    CN XII midline tongue protrusion    Motor: normal and non focal.  Sensation:  Light touch Normal.               Coordination/Complex Motor:  - Finger to Nose    Labs   CBC:  Recent Labs  Lab 06/20/23 0322 06/22/23 0131 06/23/23 1058 06/24/23 0817  WBC 11.3*   < > 10.9* 11.7*  NEUTROABS 8.0*  --   --   --   HGB 9.9*   < > 10.2* 9.5*  HCT 31.8*   < > 33.0* 30.1*  MCV 98.1   < > 100.0 96.8  PLT 413*   < > 346 382   < > = values in this interval not displayed.    Basic Metabolic Panel:  Lab Results  Component Value Date   NA 134 (L) 06/24/2023   K 3.8 06/24/2023   CO2 19 (L) 06/24/2023   GLUCOSE 161 (H) 06/24/2023   BUN 17 06/24/2023   CREATININE 1.48 (H) 06/24/2023   CALCIUM 8.3 (L) 06/24/2023   GFRNONAA 41 (L) 06/24/2023   GFRAA 43 (L) 12/17/2019   Lipid Panel:  Lab Results  Component Value Date   LDLCALC 62 06/20/2016   HgbA1c:  Lab Results  Component Value Date   HGBA1C 6.3 (H) 06/22/2023   Urine Drug Screen:     Component Value Date/Time   LABOPIA NONE DETECTED 07/05/2022 0530   COCAINSCRNUR NONE DETECTED 07/05/2022 0530   LABBENZ NONE DETECTED 07/05/2022 0530   AMPHETMU NONE DETECTED 07/05/2022 0530   THCU NONE DETECTED 07/05/2022 0530   LABBARB NONE DETECTED 07/05/2022 0530    Alcohol Level     Component Value Date/Time   ETH <5 06/19/2016 1039     CT Head without contrast(Personally reviewed):   MRI Brain 08/2021. IMPRESSION: MRI scan of the brain without contrast showing remote age bilateral ventral thalamic, right putamen and left internal capsule lacunar infarcts and mild changes of chronic small vessel disease.  No acute abnormalities noted.  Probably no significant change compared with previous MRI from 12/01/2020   Impression   TZIREL MADIA is a 58 y.o. female with PMH significant for heavy uterine bleeding. Her neurologic examination is normal. Several episodes of dizziness, starring off spells.   Hg 9.9.  Primary Diagnosis:  Starring off spells concerning for partial seizures vs. Hypoperfusion/dizziness in setting of heavy uterine bleeding.    Recommendations   LTM overnight.  No need for AEDs at this time.  Adequate hydration.  Neurology will follow. ______________________________________________________________________   Thank you for the opportunity to take part in the care of this patient. If you have any further questions, please contact the neurology consultation attending.  Signed,  _ _ _   _ __   _ __ _ _  __ __   _ __   __ _

## 2023-06-25 ENCOUNTER — Inpatient Hospital Stay (HOSPITAL_COMMUNITY): Payer: Medicare HMO

## 2023-06-25 DIAGNOSIS — E1122 Type 2 diabetes mellitus with diabetic chronic kidney disease: Secondary | ICD-10-CM | POA: Diagnosis not present

## 2023-06-25 DIAGNOSIS — D259 Leiomyoma of uterus, unspecified: Secondary | ICD-10-CM | POA: Diagnosis not present

## 2023-06-25 DIAGNOSIS — R569 Unspecified convulsions: Secondary | ICD-10-CM

## 2023-06-25 DIAGNOSIS — I10 Essential (primary) hypertension: Secondary | ICD-10-CM | POA: Diagnosis not present

## 2023-06-25 DIAGNOSIS — R55 Syncope and collapse: Secondary | ICD-10-CM | POA: Diagnosis not present

## 2023-06-25 DIAGNOSIS — D500 Iron deficiency anemia secondary to blood loss (chronic): Secondary | ICD-10-CM | POA: Diagnosis not present

## 2023-06-25 DIAGNOSIS — E1165 Type 2 diabetes mellitus with hyperglycemia: Secondary | ICD-10-CM | POA: Diagnosis not present

## 2023-06-25 DIAGNOSIS — N920 Excessive and frequent menstruation with regular cycle: Secondary | ICD-10-CM | POA: Insufficient documentation

## 2023-06-25 DIAGNOSIS — I129 Hypertensive chronic kidney disease with stage 1 through stage 4 chronic kidney disease, or unspecified chronic kidney disease: Secondary | ICD-10-CM | POA: Diagnosis not present

## 2023-06-25 DIAGNOSIS — N1832 Chronic kidney disease, stage 3b: Secondary | ICD-10-CM | POA: Insufficient documentation

## 2023-06-25 DIAGNOSIS — I679 Cerebrovascular disease, unspecified: Secondary | ICD-10-CM | POA: Diagnosis not present

## 2023-06-25 LAB — MAGNESIUM: Magnesium: 1.7 mg/dL (ref 1.7–2.4)

## 2023-06-25 LAB — GLUCOSE, CAPILLARY
Glucose-Capillary: 142 mg/dL — ABNORMAL HIGH (ref 70–99)
Glucose-Capillary: 130 mg/dL — ABNORMAL HIGH (ref 70–99)
Glucose-Capillary: 209 mg/dL — ABNORMAL HIGH (ref 70–99)
Glucose-Capillary: 164 mg/dL — ABNORMAL HIGH (ref 70–99)

## 2023-06-25 LAB — CBC
HCT: 30 % — ABNORMAL LOW (ref 36.0–46.0)
Hemoglobin: 9.5 g/dL — ABNORMAL LOW (ref 12.0–15.0)
MCH: 29.8 pg (ref 26.0–34.0)
MCHC: 31.7 g/dL (ref 30.0–36.0)
MCV: 94 fL (ref 80.0–100.0)
Platelets: 366 10*3/uL (ref 150–400)
RBC: 3.19 MIL/uL — ABNORMAL LOW (ref 3.87–5.11)
RDW: 15.8 % — ABNORMAL HIGH (ref 11.5–15.5)
WBC: 9.1 10*3/uL (ref 4.0–10.5)
nRBC: 0 % (ref 0.0–0.2)

## 2023-06-25 LAB — BASIC METABOLIC PANEL
Anion gap: 8 (ref 5–15)
BUN: 20 mg/dL (ref 6–20)
CO2: 16 mmol/L — ABNORMAL LOW (ref 22–32)
Calcium: 8.2 mg/dL — ABNORMAL LOW (ref 8.9–10.3)
Chloride: 109 mmol/L (ref 98–111)
Creatinine, Ser: 1.5 mg/dL — ABNORMAL HIGH (ref 0.44–1.00)
GFR, Estimated: 41 mL/min — ABNORMAL LOW (ref >60)
Glucose, Bld: 208 mg/dL — ABNORMAL HIGH (ref 70–99)
Potassium: 4.5 mmol/L (ref 3.5–5.1)
Sodium: 133 mmol/L — ABNORMAL LOW (ref 135–145)

## 2023-06-25 LAB — PHOSPHORUS: Phosphorus: 2.6 mg/dL (ref 2.5–4.6)

## 2023-06-25 MED ORDER — SODIUM CHLORIDE 0.9 % IV SOLN
250.0000 mg | Freq: Once | INTRAVENOUS | Status: AC
  Administered 2023-06-25: 250 mg via INTRAVENOUS
  Filled 2023-06-25: qty 12.5

## 2023-06-25 MED ORDER — SODIUM CHLORIDE 0.9 % IV SOLN: 250.0000 mg | Freq: Once | INTRAVENOUS | Status: DC

## 2023-06-25 NOTE — Discharge Summary (Incomplete)
Physician Discharge Summary   Patient: Annette Munoz MRN: [id number] DOB: 30-Oct-1965  Admit date:     06/22/2023  Discharge date: {dischdate:26783}  Discharge Physician: Alberteen Sam   PCP: Lewis Moccasin, MD     Recommendations at discharge:  ***     Discharge Diagnoses: Principal Problem:   Syncope Active Problems:   Iron deficiency anemia due to chronic blood loss   Cerebrovascular disease   Essential hypertension   Hyperlipidemia   Diabetes mellitus (HCC)   Morbid obesity (HCC)   Seizure-like activity (HCC)   CKD stage 3b, GFR 30-44 ml/min (HCC)   Menorrhagia due to fibroids   Resolved Problems:   * No resolved hospital problems. [facility] Course: Annette Munoz is a 57 y.o. F with MO, DM, HTN, anemia due to fibroids and hx stroke who presented with recurrent syncope.  9/4: Seen in ER for syncope, work up reassuring, d/c'd home 9/5: Seen in [place] ER for syncope, again d/c'd home 9/6: Seen third time in ER for syncope, this time admitted 9/7: Work up unremarkable 9/8: Noted to have episodes of unresponsiveness and eyes rolling back, Neurology consulted, LTM EEG started                    Narrative  US PELVIS NON OB TRANSABDOMINAL WITHOUT DOPPLER  HISTORY: Heavy bleeding.  TECHNIQUE: Transabdominal grayscale and both color and spectral Doppler  imaging of the pelvis. Per technologist, patient unable to tolerate a transvaginal exam.  COMPARISON: None    FINDINGS:  The uterus is normal in size and measures 12.2 cm sagittal x 5.0 cm anteroposterior x 6.3 cm transverse. The uterus is heterogeneous in appearance with areas of focal hypoechogenicity, likely representing fibroids. The largest mass within the anterior myometrium measures up to 3.6 cm. The endometrium is not well visualized on this transabdominal exam only.  The right ovary measures 3.9 x 2.3 x 2.2 cm. Dominant follicle measuring 1.9 cm. Normal arterial and venous  blood flow. The right adnexa is unremarkable. The left ovary measures 2.7 x 0.9 x 1.4 cm. Normal appearance without a focal lesion.. Normal arterial and venous blood flow. The left adnexa is unremarkable.  There is no free fluid.  Impression  IMPRESSION: 1.  Multi fibroid uterus. 2.  Unremarkable sonographic appearance of the bilateral ovaries.  Electronically Signed by: Pauline Aus on 06/21/2023 11:46 AM   {Tip this will not be part of the note when signed Body mass index is 45.73 kg/m. , ,  (Optional):26781}     [facility] Controlled Substances Registry was reviewed for this patient prior to discharge.***  Consultants: *** Procedures performed: ***  Disposition: {Plan; Disposition:26390} Diet recommendation:  {Diet_Plan:26776}  DISCHARGE MEDICATION: Allergies as of 06/25/2023       Reactions   Lisinopril Cough     Med Rec must be completed prior to using this [facility]***       Follow-up Information     Lewis Moccasin, MD. Schedule an appointment as soon as possible for a visit in 1 week(s).   Specialty: Family Medicine Contact information: [address] [place] [address] [phone]         Richardean Chimera, MD. Go to.   Specialty: Obstetrics and Gynecology Why: Appt at 10:10 on 9/11 Contact information: [address] [place] [address] [phone]                   Discharge Exam: Annette Munoz  Weights   06/24/23 0500 06/25/23 0500  Weight: 111.3 kg 113.4 kg    General: ***Pt is alert, awake, not in acute distress Cardiovascular: RRR, nl S1-S2, no murmurs appreciated.   No LE edema.   Respiratory: Normal respiratory rate and rhythm.  CTAB without rales or wheezes. Abdominal: Abdomen soft and non-tender.  No distension or HSM.   Neuro/Psych: Strength symmetric in upper and lower extremities.  Judgment and insight appear normal***.   Condition at discharge: {DC Condition:26389}  The results of  significant diagnostics from this hospitalization (including imaging, microbiology, ancillary and laboratory) are listed below for reference.   Imaging Studies: Overnight EEG with video  Result Date: 06/25/2023 Charlsie Quest, MD     06/25/2023  9:53 AM Patient Name: Annette Munoz MRN: [id number] Epilepsy Attending: Charlsie Quest Referring Physician/Provider: Elmer Picker, NP Duration: 06/24/2023 1058 to 06/25/2023 0930 Patient history: 57 y.o. female with PMH significant for  has a past medical history of Anemia, Arthritis, Diabetes mellitus without complication (HCC), Hypertension, and Stroke (HCC). who presents with few episodes of near syncope in setting of heavy uterine bleeding. Today has 2-3 episodes of starring off that was brief in nature, but no confusion/sedation after spell. EEG to evaluate for seizure Level of alertness: Awake, asleep AEDs during EEG study: None Technical aspects: This EEG study was done with scalp electrodes positioned according to the 10-20 International system of electrode placement. Electrical activity was reviewed with band pass filter of 1-70Hz , sensitivity of 7 uV/mm, display speed of 25mm/sec with a 60Hz  notched filter applied as appropriate. EEG data were recorded continuously and digitally stored.  Video monitoring was available and reviewed as appropriate. Description: The posterior dominant rhythm consists of 9-10 Hz activity of moderate voltage (25-35 uV) seen predominantly in posterior head regions, symmetric and reactive to eye opening and eye closing. Sleep was characterized by vertex waves, sleep spindles (12 to 14 Hz), maximal frontocentral region. Event button was pressed on 06/24/2023 at 1417 for staring. Concomitant EEG before, during and after the event did not show any EEG changes suggest seizure. Event button was pressed on 06/25/2023 at 0707 for dizziness. Concomitant EEG before, during and after the event did not show any EEG changes suggest seizure.  Hyperventilation and photic stimulation were not performed.   IMPRESSION: This study is within normal limits. No seizures or epileptiform discharges were seen throughout the recording. One event was recorded on 06/24/2023 at 1417 during which patient was noted to be staring.  Concomitant EEG did not show any EEG changes suggest seizure.  This was most likely a nonepileptic event. One event was recorded on 06/25/2023 at 0707 during which patient reported dizziness.  Concomitant EEG did not show any EEG changes suggest seizure.  This was most likely a nonepileptic event. A normal interictal EEG does not exclude the diagnosis of epilepsy. Charlsie Quest    Microbiology: Results for orders placed or performed during the hospital encounter of 06/11/21  SARS CORONAVIRUS 2 (TAT 6-24 HRS) Nasopharyngeal Nasopharyngeal Swab     Status: Abnormal   Collection Time: 06/11/21  1:47 PM   Specimen: Nasopharyngeal Swab  Result Value Ref Range Status   SARS Coronavirus 2 POSITIVE (A) NEGATIVE Final    Comment: (NOTE) SARS-CoV-2 target nucleic acids are DETECTED.  The SARS-CoV-2 RNA is generally detectable in upper and lower respiratory specimens during the acute phase of infection. Positive results are indicative of the presence of SARS-CoV-2 RNA. Clinical correlation with patient history and other  diagnostic information is  necessary to determine patient infection status. Positive results do not rule out bacterial infection or co-infection with other viruses.  The expected result is Negative.  Fact Sheet for Patients: HairSlick.no  Fact Sheet for Healthcare Providers: quierodirigir.com  This test is not yet approved or cleared by the [place] FDA and  has been authorized for detection and/or diagnosis of SARS-CoV-2 by FDA under an Emergency Use Authorization (EUA). This EUA will remain  in effect (meaning this test can be used) for the duration of  the COVID-19 declaration under Section 564(b)(1) of the Act, 21 U. S.C. section 360bbb-3(b)(1), unless the authorization is terminated or revoked sooner.   Performed at [facility], [address], [place], [place] [address]     Labs: CBC: Recent Labs  Lab 06/20/23 [phone] 06/22/23 0131 06/22/23 0706 06/22/23 1803 06/23/23 1058 06/24/23 0817 06/25/23 0724  WBC 11.3* 12.2*  --   --  10.9* 11.7* 9.1  NEUTROABS 8.0*  --   --   --   --   --   --   HGB 9.9* 8.0* 7.2* 8.9* 10.2* 9.5* 9.5*  HCT 31.8* 25.6* 23.3* 27.8* 33.0* 30.1* 30.0*  MCV 98.1 99.6  --   --  100.0 96.8 94.0  PLT 413* 399  --   --  346 382 366   Basic Metabolic Panel: Recent Labs  Lab 06/20/23 0322 06/22/23 0131 06/23/23 1058 06/24/23 0817 06/25/23 0724  NA 138 137 135 134* 133*  K 3.7 3.6 3.7 3.8 4.5  CL 107 109 110 108 109  CO2 19* 19* 18* 19* 16*  GLUCOSE 205* 187* 204* 161* 208*  BUN 31* 27* 18 17 20   CREATININE 1.82* 1.63* 1.48* 1.48* 1.50*  CALCIUM 8.4* 8.1* 8.3* 8.3* 8.2*  MG  --   --  1.9 1.7 1.7  PHOS  --   --  2.1* 2.4* 2.6   Liver Function Tests: Recent Labs  Lab 06/23/23 1058  AST 15  ALT 17  ALKPHOS 61  BILITOT 0.5  PROT 6.2*  ALBUMIN 3.1*   CBG: Recent Labs  Lab 06/24/23 1203 06/24/23 1558 06/24/23 2112 06/25/23 0617 06/25/23 1203  GLUCAP 166* 146* 185* 142* 130*    Discharge time spent: approximately *** minutes spent on discharge counseling, evaluation of patient on day of discharge, and coordination of discharge planning with nursing, social work, pharmacy and case management  Signed: Alberteen Sam, MD [facility] 06/25/2023

## 2023-06-25 NOTE — Care Management Important Message (Signed)
Important Message  Patient Details  Name: Annette Munoz MRN: [id number] Date of Birth: 29-Jun-1966   Medicare Important Message Given:  Yes     Safiyah Cisney Stefan Church 06/25/2023, 2:59 PM

## 2023-06-25 NOTE — Progress Notes (Signed)
LTM EEG discontinued - no skin breakdown at unhook.   

## 2023-06-25 NOTE — Evaluation (Signed)
Physical Therapy Evaluation Patient Details Name: Annette Munoz MRN: [id number] DOB: September 28, 1966 Today's Date: 06/25/2023  History of Present Illness  57 y.o. female presents to [facility] hospital on 06/22/2023 with syncopal episode. EEG negative. PMH includes DMII, HTN, CVA with residual memory deficits, anemia.  Clinical Impression  Pt presents to PT with deficits in functional mobility, gait, balance, cognition. Pt reports dizziness when standing from commode during session, associated with 2 instances of posterior loss of balance. BP stable in supine after completion of this trial of ambulation, orthostatics negative after and asymptomatic during these. At this time pt's symptoms do not have a typical vestibular presentation either, as pt reported spontaneous onset of symptoms later in session without change in head position. PT will follow in an effort to improve stability and reduce risk for falls. PT encourages use of RW for all ambulation to improve stability. PT recommends HHPT at the time of discharge.        If plan is discharge home, recommend the following: A little help with walking and/or transfers;A little help with bathing/dressing/bathroom;Assistance with cooking/housework;Assist for transportation;Help with stairs or ramp for entrance;Supervision due to cognitive status;Direct supervision/assist for medications management;Direct supervision/assist for financial management   Can travel by private vehicle        Equipment Recommendations BSC/3in1  Recommendations for Other Services       Functional Status Assessment Patient has had a recent decline in their functional status and demonstrates the ability to make significant improvements in function in a reasonable and predictable amount of time.     Precautions / Restrictions Precautions Precautions: Fall Restrictions Weight Bearing Restrictions: No      Mobility  Bed Mobility Overal bed mobility: Modified Independent                   Transfers Overall transfer level: Needs assistance Equipment used: 1 person hand held assist Transfers: Sit to/from Stand Sit to Stand: Contact guard assist                Ambulation/Gait Ambulation/Gait assistance: Min assist Gait Distance (Feet): 15 Feet (15' x 2) Assistive device: IV Pole, 1 person hand held assist Gait Pattern/deviations: Step-to pattern Gait velocity: reduced Gait velocity interpretation: <1.8 ft/sec, indicate of risk for recurrent falls   General Gait Details: pt with posterior loss of balance druing 2nd bout of ambulation when reporting dizzy symptoms. Pt throws her head back and then loses balance posteriorly twice during this short bout of ambulation. No significant balance deviations noted during initial bout of ambulation when pt denied symtoms of dizziness  Stairs            Wheelchair Mobility     Tilt Bed    Modified Rankin (Stroke Patients Only)       Balance Overall balance assessment: Needs assistance Sitting-balance support: Feet supported, No upper extremity supported Sitting balance-Leahy Scale: Good     Standing balance support: Single extremity supported, Reliant on assistive device for balance Standing balance-Leahy Scale: Poor                               Pertinent Vitals/Pain Pain Assessment Pain Assessment: Faces Faces Pain Scale: Hurts even more Pain Location: L arm when BP cuff inflating Pain Descriptors / Indicators: Moaning Pain Intervention(s): Monitored during session    Home Living Family/patient expects to be discharged to:: Private residence Living Arrangements: Spouse/significant other;Children Available Help at Discharge: Family;Available  PRN/intermittently Type of Home: House Home Access: Stairs to enter Entrance Stairs-Rails: Right Entrance Stairs-Number of Steps: 9   Home Layout: One level Home Equipment: Agricultural consultant (2 wheels);Cane - single point       Prior Function Prior Level of Function : Independent/Modified Independent             Mobility Comments: ambulatory with use of SPC when in the community       Extremity/Trunk Assessment   Upper Extremity Assessment Upper Extremity Assessment: Overall WFL for tasks assessed    Lower Extremity Assessment Lower Extremity Assessment: Overall WFL for tasks assessed    Cervical / Trunk Assessment Cervical / Trunk Assessment: Normal  Communication   Communication Communication: No apparent difficulties Cueing Techniques: Verbal cues  Cognition Arousal: Alert Behavior During Therapy: WFL for tasks assessed/performed Overall Cognitive Status: History of cognitive impairments - at baseline                                 General Comments: pt with history of cognitive deficits since CVA, is oriented x4, impulsive at times but follows commands well to redirect.        General Comments General comments (skin integrity, edema, etc.): Orthostatic vitals: supine 131/56 HR 70, sitting 150/56 HR 67, standing 136/65 HR 89. Pt symptomatic earlier in session when standing from commode, asymptomatic with this set of orthostatic vitals later in session    Exercises     Assessment/Plan    PT Assessment Patient needs continued PT services  PT Problem List Decreased activity tolerance;Decreased balance;Decreased strength;Decreased mobility;Decreased cognition;Decreased knowledge of use of DME       PT Treatment Interventions DME instruction;Gait training;Stair training;Functional mobility training;Therapeutic activities;Therapeutic exercise;Balance training;Neuromuscular re-education;Patient/family education;Cognitive remediation    PT Goals (Current goals can be found in the Care Plan section)  Acute Rehab PT Goals Patient Stated Goal: to stop dizziness PT Goal Formulation: With patient Time For Goal Achievement: 07/09/23 Potential to Achieve Goals: Fair     Frequency Min 1X/week     Co-evaluation               AM-PAC PT "6 Clicks" Mobility  Outcome Measure Help needed turning from your back to your side while in a flat bed without using bedrails?: None Help needed moving from lying on your back to sitting on the side of a flat bed without using bedrails?: None Help needed moving to and from a bed to a chair (including a wheelchair)?: A Little Help needed standing up from a chair using your arms (e.g., wheelchair or bedside chair)?: A Little Help needed to walk in hospital room?: A Little Help needed climbing 3-5 steps with a railing? : Total 6 Click Score: 18    End of Session   Activity Tolerance: Treatment limited secondary to medical complications (Comment) (dizziness) Patient left: in bed;with call bell/phone within reach;with family/visitor present Nurse Communication: Mobility status PT Visit Diagnosis: Other abnormalities of gait and mobility (R26.89);Muscle weakness (generalized) (M62.81);Difficulty in walking, not elsewhere classified (R26.2);Dizziness and giddiness (R42)    Time: 4098-1191 PT Time Calculation (min) (ACUTE ONLY): 37 min   Charges:   PT Evaluation $PT Eval Low Complexity: 1 Low   PT General Charges $$ ACUTE PT VISIT: 1 Visit         Arlyss Gandy, PT, DPT Acute Rehabilitation Office [phone]   Arlyss Gandy 06/25/2023, 12:09 PM

## 2023-06-25 NOTE — Progress Notes (Signed)
  Progress Note   Patient: Annette Munoz GLO:[id number] DOB: Sep 11, 1966 DOA: 06/22/2023     3 DOS: the patient was seen and examined on 06/25/2023        Brief hospital course: Annette Munoz is a 57 y.o. F with MO, DM, HTN, anemia due to fibroids and hx stroke who presented with recurrent syncope.  9/4: Seen in ER for syncope, work up reassuring, d/c'd home 9/5: Seen in [place] ER for syncope, again d/c'd home 9/6: Seen third time in ER for syncope, this time admitted 9/7: Work up unremarkable 9/8: Noted to have episodes of unresponsiveness and eyes rolling back, Neurology consulted, LTM EEG started     Assessment and Plan:  Syncope due to anemia Iron def anemia Transfused 2 units.  Hgb up to 9 and stable Iron studies low - Give IV iron - Supplement Iron at dc  Fibroids D/w OB, appt set for Wednesday - Continue Aygestin - Follow up with OB outpatient   Seizure like activity EEG captured 2 events which were nonepileptic - Obtain MRI brain per neuro  Dyspnea - Obtain CXR  Hypertension BP normal - HOld amlodipine, Coreg  Hyperlipidemia - Continue Lipitor  Morbid obesity BMI >40  CKD IIIb  Cr stable relative to baseline  Depression - Continue citalopram        Subjective: No new complaints.  Still dizzy.  No fever, no confsuion, no respiratory symptoms.     Physical Exam: BP (!) 133/56 (BP Location: Left Arm)   Pulse 74   Temp 98.6 F (37 C) (Oral)   Resp 18   Wt 113.4 kg   SpO2 97%   BMI 45.73 kg/m   General: Pt is alert, awake, not in acute distress Cardiovascular: RRR, nl S1-S2, no murmurs appreciated.   No LE edema.   Respiratory: Normal respiratory rate and rhythm.  CTAB without rales or wheezes. Abdominal: Abdomen soft and non-tender.  No distension or HSM.   Neuro/Psych: Strength symmetric in upper and lower extremities.  Judgment and insight appear normal.    Family Communication: Annette Munoz    Disposition: Status is:  Inpatient         Author: Alberteen Sam, MD 06/25/2023 8:03 PM  For on call review [email or web address].

## 2023-06-25 NOTE — TOC Transition Note (Signed)
Transition of Care [facility]) - CM/SW Discharge Note   Patient Details  Name: Annette Munoz MRN: [id number] Date of Birth: December 31, 1965  [facility]) CM/SW Contact:  Kermit Balo, RN Phone Number: 06/25/2023, 3:54 PM   Clinical Narrative:    Pt is discharging home today after her MRI.  Home health services arranged with [facility]. Information on the AVS. Frances Furbish will contact her for the first home visit. BSC ordered through [employer] and will be delivered to the room. Pt has supervision at home and transportation to home.   Final next level of care: Home w Home Health Services Barriers to Discharge: No Barriers Identified   Patient Goals and CMS Choice CMS Medicare.gov Compare Post Acute Care list provided to:: Patient Choice offered to / list presented to : Patient, Adult Children, Spouse  Discharge Placement                         Discharge Plan and Services Additional resources added to the After Visit Summary for       Post Acute Care Choice: Home Health                    HH Arranged: PT, OT [facility] Agency: [facility] Health Care Date [facility] Agency Contacted: 06/25/23   Representative spoke with at [facility] Agency: Kandee Keen  Social Determinants of Health (SDOH) Interventions SDOH Screenings   Food Insecurity: No Food Insecurity (06/22/2023)  Housing: Patient Declined (06/22/2023)  Transportation Needs: No Transportation Needs (06/22/2023)  Utilities: Not At Risk (06/22/2023)  Depression (PHQ2-9): Medium Risk (02/09/2020)  Social Connections: Unknown (06/20/2023)   Received from [facility]  Tobacco Use: Low Risk  (06/22/2023)     Readmission Risk Interventions     No data to display

## 2023-06-25 NOTE — Progress Notes (Signed)
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  During evaluation, patient was noted to have 2-3 brief episodes of staring off witnessed by family with intermittent dizziness complaints and neurology was consulted for further evaluation.   Subjective: Patient sitting up in bed, family at bedside including daughter and husband.  Patient complaints of her EEG leads itching and frequent leg cramps.  LTM EEG monitoring in place. Overnight EEG within normal limits, no seizures or epileptiform discharges, staring event recorded without concomitant EEG change.  Dizziness event also reported without concomitant EEG change.  Exam: Vitals:   06/25/23 0327 06/25/23 0730  BP: 124/75 (!) 144/74  Pulse: 65 77  Resp: 16 18  Temp: 99 F (37.2 C) 98.5 F (36.9 C)  SpO2: 98% 99%   Gen: Sitting up in bed, in no acute distress Resp: non-labored breathing, no respiratory distress on room air Abd: soft, non-tender, non-distended  Neuro: Mental Status:, Alert, oriented to self, year, and location.  Patient initially states that the month is October then corrects herself to September.  Patient incorrectly states that she is 40, is surprised when told her actual age.  Memory deficits reported to be baseline from previous stroke per patient's family.  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 Motor: Moves all extremities spontaneously and antigravity.  No unilateral or asymmetrical weakness noted. Sensory: Intact and symmetric to light touch  throughout Gait: Deferred  Pertinent Labs: CBC    Component Value Date/Time   WBC 9.1 06/25/2023 0724   RBC 3.19 (L) 06/25/2023 0724   HGB 9.5 (L) 06/25/2023 0724   HGB 10.0 (L) 02/09/2020 1638   HCT 30.0 (L) 06/25/2023 0724   HCT 31.1 (L) 02/09/2020 1638   PLT 366 06/25/2023 0724   PLT 438 02/09/2020 1638   MCV 94.0 06/25/2023 0724   MCV 90 02/09/2020 1638   MCH 29.8 06/25/2023 0724   MCHC 31.7 06/25/2023 0724   RDW 15.8 (H) 06/25/2023 0724   RDW 13.7 02/09/2020 1638   LYMPHSABS 2.0 06/20/2023 0322   MONOABS 0.9 06/20/2023 0322   EOSABS 0.2 06/20/2023 0322   BASOSABS 0.1 06/20/2023 0322   CMP     Component Value Date/Time   NA 133 (L) 06/25/2023 0724   K 4.5 06/25/2023 0724   CL 109 06/25/2023 0724   CO2 16 (L) 06/25/2023 0724   GLUCOSE 208 (H) 06/25/2023 0724   BUN 20 06/25/2023 0724   CREATININE 1.50 (H) 06/25/2023 0724   CREATININE 0.67 06/24/2015 1341   CALCIUM 8.2 (L) 06/25/2023 0724   PROT 6.2 (L) 06/23/2023 1058   ALBUMIN 3.1 (L) 06/23/2023 1058   AST 15 06/23/2023 1058   ALT 17 06/23/2023 1058   ALKPHOS 61 06/23/2023 1058   BILITOT 0.5 06/23/2023 1058   GFRNONAA 41 (L) 06/25/2023 0724   GFRNONAA >89 06/24/2015 1341   Urinalysis    Component Value Date/Time   COLORURINE YELLOW 06/24/2023 1035   APPEARANCEUR CLEAR 06/24/2023 1035   LABSPEC 1.022 06/24/2023 1035   PHURINE 5.0 06/24/2023 1035   GLUCOSEU >=500 (A) 06/24/2023 1035   GLUCOSEU >=1000 08/04/2010 6578  HGBUR NEGATIVE 06/24/2023 1035   BILIRUBINUR NEGATIVE 06/24/2023 1035   BILIRUBINUR neg 03/22/2015 1032   KETONESUR NEGATIVE 06/24/2023 1035   PROTEINUR NEGATIVE 06/24/2023 1035   UROBILINOGEN 0.2 08/07/2015 1754   NITRITE NEGATIVE 06/24/2023 1035   LEUKOCYTESUR NEGATIVE 06/24/2023 1035   Imaging Reviewed: Overnight EEG 06/24/23 - 06/25/23:  "This study is within normal limits. No seizures or epileptiform discharges were seen throughout the recording One event was recorded on 06/24/2023 at  1417 during which patient was noted to be staring.  Concomitant EEG did not show any EEG changes suggest seizure.  This was most likely a nonepileptic event. One event was recorded on 06/25/2023 at 0707 during which patient reported dizziness.  Concomitant EEG did not show any EEG changes suggest seizure.  This was most likely a nonepileptic event. 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  Family also reported multiple brief episodes of staring off spells without confusion, shaking, convulsions, tongue biting, or urinary incontinence.  EEG overnight captured dizzy spell and staring off spell without concomitant EEG change.  Suspect that patient's presentation is likely related to hypoperfusion/dizziness in the setting of heavy uterine bleeding, anemia requiring transfusion. Hgb 9.9 > 8.0 > 7.2 (1 unit PRBC) > 8.9 > 10.2 > 9.5 > 9.5, stable.   Daughter at bedside updated by attending MD that the patient's events captured today were nonepileptic  Recommendations: - Discontinue LTM EEG - MRI brain with and without contrast after EEG discontinued - Will continue to defer AED therapy at this time  - If MRI brain is negative, neurology will sign off  Lanae Boast, AGACNP-BC [facility] [phone]  Attending Neurologist's note:  I personally saw this patient, gathering history, performing a brief neurologic examination as she was passing in the hallway for MRI, reviewed relevant labs, will personally review MRI brain on completion, and formulated the assessment and plan, adding the note above for completeness and clarity to accurately reflect my thoughts  Brooke Dare MD-PhD [facility] [phone] Available 7 AM to 7 PM, outside these hours please contact Neurologist on call listed on AMION

## 2023-06-25 NOTE — Hospital Course (Signed)
Annette Munoz is a 57 y.o. F with MO, DM, HTN, anemia due to fibroids and hx stroke who presented with recurrent syncope.  9/4: Seen in ER for syncope, work up reassuring, d/c'd home 9/5: Seen in [place] ER for syncope, again d/c'd home 9/6: Seen third time in ER for syncope, this time admitted 9/7: Work up unremarkable 9/8: Noted to have episodes of unresponsiveness and eyes rolling back, Neurology consulted, LTM EEG started

## 2023-06-25 NOTE — Progress Notes (Signed)
vLTM maintenance  All impedances below 10k  No skin breakdown at FP1 FP2  T4  T6

## 2023-06-25 NOTE — Procedures (Addendum)
Patient Name: Annette Munoz  MRN: [id number]  Epilepsy Attending: Charlsie Quest  Referring Physician/Provider: Elmer Picker, NP  Duration: 06/24/2023 1058 to 06/25/2023 1111  Patient history: 57 y.o. female with PMH significant for  has a past medical history of Anemia, Arthritis, Diabetes mellitus without complication (HCC), Hypertension, and Stroke (HCC). who presents with few episodes of near syncope in setting of heavy uterine bleeding. Today has 2-3 episodes of starring off that was brief in nature, but no confusion/sedation after spell. EEG to evaluate for seizure  Level of alertness: Awake, asleep  AEDs during EEG study: None  Technical aspects: This EEG study was done with scalp electrodes positioned according to the 10-20 International system of electrode placement. Electrical activity was reviewed with band pass filter of 1-70Hz , sensitivity of 7 uV/mm, display speed of 97mm/sec with a 60Hz  notched filter applied as appropriate. EEG data were recorded continuously and digitally stored.  Video monitoring was available and reviewed as appropriate.  Description: The posterior dominant rhythm consists of 9-10 Hz activity of moderate voltage (25-35 uV) seen predominantly in posterior head regions, symmetric and reactive to eye opening and eye closing. Sleep was characterized by vertex waves, sleep spindles (12 to 14 Hz), maximal frontocentral region.   Event button was pressed on 06/24/2023 at 1417 for staring. Concomitant EEG before, during and after the event did not show any EEG changes suggest seizure.  Event button was pressed on 06/25/2023 at 0707 for dizziness. Concomitant EEG before, during and after the event did not show any EEG changes suggest seizure.  Hyperventilation and photic stimulation were not performed.     IMPRESSION: This study is within normal limits. No seizures or epileptiform discharges were seen throughout the recording.  One event was recorded on 06/24/2023 at 1417  during which patient was noted to be staring.  Concomitant EEG did not show any EEG changes suggest seizure.  This was most likely a nonepileptic event.  One event was recorded on 06/25/2023 at 0707 during which patient reported dizziness.  Concomitant EEG did not show any EEG changes suggest seizure.  This was most likely a nonepileptic event.  A normal interictal EEG does not exclude the diagnosis of epilepsy.  Annette Munoz

## 2023-06-25 NOTE — Progress Notes (Signed)
    Durable Medical Equipment  (From admission, onward)           Start     Ordered   06/25/23 1551  For home use only DME Bedside commode  Once       Question:  Patient needs a bedside commode to treat with the following condition  Answer:  Weakness   06/25/23 1551            BSC:  The patient is confined to one level of the home environment and there is no toilet on the that level of the home.

## 2023-06-26 ENCOUNTER — Other Ambulatory Visit (HOSPITAL_COMMUNITY): Payer: Self-pay

## 2023-06-26 DIAGNOSIS — I10 Essential (primary) hypertension: Secondary | ICD-10-CM | POA: Diagnosis not present

## 2023-06-26 DIAGNOSIS — D500 Iron deficiency anemia secondary to blood loss (chronic): Secondary | ICD-10-CM

## 2023-06-26 DIAGNOSIS — R55 Syncope and collapse: Secondary | ICD-10-CM | POA: Diagnosis not present

## 2023-06-26 DIAGNOSIS — E1151 Type 2 diabetes mellitus with diabetic peripheral angiopathy without gangrene: Secondary | ICD-10-CM | POA: Diagnosis not present

## 2023-06-26 DIAGNOSIS — E785 Hyperlipidemia, unspecified: Secondary | ICD-10-CM

## 2023-06-26 DIAGNOSIS — R569 Unspecified convulsions: Secondary | ICD-10-CM | POA: Diagnosis not present

## 2023-06-26 DIAGNOSIS — I679 Cerebrovascular disease, unspecified: Secondary | ICD-10-CM | POA: Diagnosis not present

## 2023-06-26 DIAGNOSIS — N924 Excessive bleeding in the premenopausal period: Secondary | ICD-10-CM

## 2023-06-26 LAB — GLUCOSE, CAPILLARY
Glucose-Capillary: 111 mg/dL — ABNORMAL HIGH (ref 70–99)
Glucose-Capillary: 111 mg/dL — ABNORMAL HIGH (ref 70–99)

## 2023-06-26 MED ORDER — NORETHINDRONE ACETATE 5 MG PO TABS
10.0000 mg | ORAL_TABLET | Freq: Every day | ORAL | 0 refills | Status: AC
Start: 2023-06-27 — End: ?
  Filled 2023-06-26: qty 14, 7d supply, fill #0

## 2023-06-26 NOTE — Progress Notes (Signed)
Physical Therapy Treatment Patient Details Name: Annette Munoz MRN: [id number] DOB: 08-19-1966 Today's Date: 06/26/2023   History of Present Illness 57 y.o. female presents to [facility] hospital on 06/22/2023 with syncopal episode. EEG negative. PMH includes DMII, HTN, CVA with residual memory deficits, anemia.    PT Comments  Pt seen for PT tx with pt agreeable, daughter & husband in room. Daughter reports pt has already ambulated in hallway this AM with assistance from her. Pt is able to ambulate room<>gym with SPC with CGA; PT adjusted height of SPC. Pt negotiates 5 steps with L rail & CGA, noting dizziness after but states it goes away within ~10 seconds. Pt returns to room to use restroom, requiring cuing to flush toilet. Daughter feels comfortable with d/c plans.     If plan is discharge home, recommend the following: A little help with walking and/or transfers;A little help with bathing/dressing/bathroom;Assistance with cooking/housework;Assist for transportation;Help with stairs or ramp for entrance;Supervision due to cognitive status;Direct supervision/assist for medications management;Direct supervision/assist for financial management   Can travel by private vehicle        Equipment Recommendations  BSC/3in1    Recommendations for Other Services       Precautions / Restrictions Precautions Precautions: Fall Restrictions Weight Bearing Restrictions: No     Mobility  Bed Mobility Overal bed mobility: Modified Independent             General bed mobility comments: supine>sit    Transfers Overall transfer level: Needs assistance Equipment used: Straight cane Transfers: Sit to/from Stand Sit to Stand: Contact guard assist                Ambulation/Gait Ambulation/Gait assistance: Contact guard assist Gait Distance (Feet): 80 Feet (+ 80 ft) Assistive device: Straight cane Gait Pattern/deviations: Decreased step length - right, Decreased step length - left,  Decreased stride length Gait velocity: decreased     General Gait Details: Pt frequently reaching for daughter with LUE but pt reports she is not doing this 2/2 decreased balance; PT educated pt on need to ambulate with RW if she feels she requires BUE support. Pt is able to ambulate room<>gym with SPC with CGA.   Stairs Stairs: Yes Stairs assistance: Contact guard assist Stair Management: One rail Left Number of Stairs: 5 (ascends two 6" steps, descends three 3" steps) General stair comments: Pt negotiates stairs in gym with L ascending rail & CGA. Pt notes lightheadedness when she descends stairs but notes it quickly dissipates within ~10 seconds.   Wheelchair Mobility     Tilt Bed    Modified Rankin (Stroke Patients Only)       Balance Overall balance assessment: Needs assistance Sitting-balance support: Feet supported, No upper extremity supported Sitting balance-Leahy Scale: Good Sitting balance - Comments: Pt toilets without LOB, without assistance   Standing balance support: Single extremity supported, Reliant on assistive device for balance Standing balance-Leahy Scale: Fair                              Cognition Arousal: Alert Behavior During Therapy: WFL for tasks assessed/performed Overall Cognitive Status: History of cognitive impairments - at baseline                                 General Comments: pt with history of cognitive deficits since CVA, is oriented x4, impulsive at times but follows  commands well to redirect, decreased safety awareness        Exercises Other Exercises Other Exercises: Daughter reports pt appears to be at or close to functional baseline. PT encouraged pt/family to have pt sit if/when she feels dizzy at home, as well as to place a chair at the top of steps to allow pt to rest after negotiating stairs to home.    General Comments General comments (skin integrity, edema, etc.): Pt toilets with continent  void, requires cuing to flush toilet.      Pertinent Vitals/Pain Pain Assessment Pain Assessment: No/denies pain    Home Living                          Prior Function            PT Goals (current goals can now be found in the care plan section) Acute Rehab PT Goals Patient Stated Goal: to stop dizziness PT Goal Formulation: With patient Time For Goal Achievement: 07/09/23 Potential to Achieve Goals: Fair Progress towards PT goals: Progressing toward goals    Frequency    Min 1X/week      PT Plan      Co-evaluation              AM-PAC PT "6 Clicks" Mobility   Outcome Measure  Help needed turning from your back to your side while in a flat bed without using bedrails?: None Help needed moving from lying on your back to sitting on the side of a flat bed without using bedrails?: None Help needed moving to and from a bed to a chair (including a wheelchair)?: A Little Help needed standing up from a chair using your arms (e.g., wheelchair or bedside chair)?: A Little Help needed to walk in hospital room?: A Little Help needed climbing 3-5 steps with a railing? : A Little 6 Click Score: 20    End of Session   Activity Tolerance: Patient tolerated treatment well Patient left: in bed;with call bell/phone within reach;with family/visitor present Nurse Communication:  (notified MD of pt's c/o 1 dizzy spell during stair negotiation) PT Visit Diagnosis: Other abnormalities of gait and mobility (R26.89);Muscle weakness (generalized) (M62.81);Difficulty in walking, not elsewhere classified (R26.2);Dizziness and giddiness (R42)     Time: 7846-9629 PT Time Calculation (min) (ACUTE ONLY): 14 min  Charges:    $Therapeutic Activity: 8-22 mins PT General Charges $$ ACUTE PT VISIT: 1 Visit                     Aleda Grana, PT, DPT 06/26/23, 11:16 AM    Sandi Mariscal 06/26/2023, 11:15 AM

## 2023-06-26 NOTE — Progress Notes (Signed)
Patient ambulating in hallway with family. Patient's gait steady w/ rolling walker.

## 2023-06-26 NOTE — Plan of Care (Signed)
  Problem: Education: Goal: Knowledge of General Education information will improve Description: Including pain rating scale, medication(s)/side effects and non-pharmacologic comfort measures Outcome: Progressing   Problem: Coping: Goal: Level of anxiety will decrease Outcome: Progressing   Problem: Nutrition: Goal: Adequate nutrition will be maintained Outcome: Progressing   

## 2023-06-26 NOTE — Progress Notes (Signed)
Reviewed discharge instructions with pt and her daughter. Both stated clear understanding. IV removed and pt will be wheeled off the unit by this RN.

## 2023-06-26 NOTE — Discharge Summary (Signed)
Physician Discharge Summary   Patient: Annette Munoz MRN: [id number] DOB: [birth year]  Admit date:     06/22/2023  Discharge date: 06/26/23  Discharge Physician: Alberteen Sam   PCP: Lewis Moccasin, MD     Recommendations at discharge:  Follow up with Dr. Gaye Alken OB-Gyn tomorrow for fibroids and anemia Dr. Gaye Alken:  Please see outside ultrasound report of fibroids below  Follow up with PCP Dr. Duanne Guess in 1 week  Dr. Duanne Guess: Please check BP and if normal, resume home antihypertensives Check iron stores in 6-8 weeks     Discharge Diagnoses: Principal Problem:   Syncope likely due to symptomatic anemia Active Problems:   Iron deficiency anemia due to chronic blood loss   Cerebrovascular disease   Essential hypertension   Hyperlipidemia   Diabetes mellitus with hyperglycemia   Morbid obesity (HCC)   Seizure-like activity (HCC)   CKD stage 3b, GFR 30-44 ml/min (HCC)   Menorrhagia due to fibroids      Hospital Course: Annette Munoz is a 57 y.o. F with MO, DM, HTN, anemia due to fibroids and hx stroke who presented with recurrent syncope.  9/4: Seen in ER for syncope, work up reassuring, d/c'd home 9/5: Seen in [facility] ER for syncope, again d/c'd home 9/6: Seen third time in ER for syncope, this time admitted 9/7: Work up unremarkable 9/8: Noted to have episodes of unresponsiveness and eyes rolling back, Neurology consulted, LTM EEG started 9/9: LTM EEG normal, MRI brain normal     Anemia due to fibroids Presented with syncope and dizziness.  Transfused 2 units.  Hgb up to 9 and stable.  Found to have severe iron deficiency.    Given IV iron and discharged on oral iron.     Fibroids D/w OB, appt set for Wednesday.    Discharged on Aygestin 10 mg daily.        Nonepileptic seizure-like activity Prior to discharge, patient had repeated episodes of dizziness and then episodes of eyes-rolling back and decreased responsiveness.  There was concern for  seizures and so Neurology were consulted and EEG done.  EEG captured 2 events which were nonepileptic.  MRI brain obtained and was without acute change.  No further work up recommended.    Hypertension BP soft in the hospital, and did complain of dizziness, and so Coreg and amlodipine held at discharge - Recommend close PCP follow up   CKD IIIb  Cr stable relative to baseline                Narrative  US PELVIS NON OB TRANSABDOMINAL WITHOUT DOPPLER  HISTORY: Heavy bleeding.  TECHNIQUE: Transabdominal grayscale and both color and spectral Doppler  imaging of the pelvis. Per technologist, patient unable to tolerate a transvaginal exam.  COMPARISON: None    FINDINGS:  The uterus is normal in size and measures 12.2 cm sagittal x 5.0 cm anteroposterior x 6.3 cm transverse. The uterus is heterogeneous in appearance with areas of focal hypoechogenicity, likely representing fibroids. The largest mass within the anterior myometrium measures up to 3.6 cm. The endometrium is not well visualized on this transabdominal exam only.  The right ovary measures 3.9 x 2.3 x 2.2 cm. Dominant follicle measuring 1.9 cm. Normal arterial and venous blood flow. The right adnexa is unremarkable. The left ovary measures 2.7 x 0.9 x 1.4 cm. Normal appearance without a focal lesion.. Normal arterial and venous blood flow. The left adnexa is unremarkable.  There is no free fluid.  Impression  IMPRESSION: 1.  Multi fibroid uterus. 2.  Unremarkable sonographic appearance of the bilateral ovaries.  Electronically Signed by: Pauline Aus on 06/21/2023 11:46 AM        The [facility] Controlled Substances Registry was reviewed for this patient prior to discharge.  Consultants: Neurology Procedures performed:  MRI brain Continuous EEG   Disposition: Home health   DISCHARGE MEDICATION: Allergies as of 06/26/2023       Reactions   Lisinopril Cough        Medication List      STOP taking these medications    amLODipine 10 MG tablet Commonly known as: NORVASC   carvedilol 6.25 MG tablet Commonly known as: COREG       TAKE these medications    Alcohol Prep Pads Use to check blood sugar daily. E11.65   aspirin EC 81 MG tablet Take 81 mg by mouth daily. Swallow whole.   atorvastatin 80 MG tablet Commonly known as: LIPITOR TAKE 1 TABLET (80 MG TOTAL) BY MOUTH DAILY AT 6 PM. What changed:  how much to take when to take this additional instructions   blood glucose meter kit and supplies Kit Use daily to check blood sugar. E11.65   citalopram 20 MG tablet Commonly known as: CELEXA   D3 5000 PO Take by mouth.   dapagliflozin propanediol 10 MG Tabs tablet Commonly known as: FARXIGA Take 10 mg by mouth daily.   donepezil 10 MG tablet Commonly known as: ARICEPT Take 1 tablet (10 mg total) by mouth at bedtime.   ferrous sulfate 325 MG tablet Commonly known as: FERROUS SULFATE Take 325 mg (1 tablet) every other day    folic acid 1 MG tablet Commonly known as: FOLVITE TAKE 1 TABLET BY MOUTH EVERY MORNING *PATIENT NEEDS APPOINTMENT*   glucose blood test strip Use to check blood sugar daily. E11.65   Lancets Misc Use to check blood sugar daily. E11.65   Lancing Device Misc Use daily to check blood sugar. E11.65   multivitamin tablet Take 1 tablet by mouth daily.   norethindrone 5 MG tablet Commonly known as: AYGESTIN Take 2 tablets (10 mg total) by mouth daily. Start taking on: June 27, 2023   OZEMPIC (0.25 OR 0.5 MG/DOSE)  Inject 0.5 mg into the skin once a week. Thursdays   Premium Automatic BP Monitor Devi 1 Device by Does not apply route daily.        Follow-up Information     Lewis Moccasin, MD. Schedule an appointment as soon as possible for a visit in 1 week(s).   Specialty: Family Medicine Contact information: [address] [place] [address] [phone]         Richardean Chimera, MD. Go to.    Specialty: Obstetrics and Gynecology Why: Appt at 10:10 on 9/11 Contact information: [address] [place] [address] [phone]         [facility] Follow up.   Specialty: Home Health Services Why: The home health agency will contact you for the first home visit. Contact information: [address] [place] [address] [phone]                 Discharge Instructions     Discharge instructions   Complete by: As directed    **IMPORTANT DISCHARGE INSTRUCTIONS**   From Dr. Maryfrances Bunnell: You were admitted for passing out. Here, we found that your red blood cell level was low (your blood level, your hemoglobin)  You  were transfused blood and your hemoglobin level came up  We found that your iron level was low, probably from menstrual bleeding from your fibroids  I talked to Dr. Magnus Sinning office, and they are expecting you tomorrow morning  Take your Aygestin 10 mg daily  THey will tell you when or if you can stop it.  For now, HOLD (do not take) your blood pressure medicines carvedilol and amlodipine  Go see Dr. Duanne Guess in 1-2 weeks Dr. Duanne Guess may restart this  If you check your blood pressure at home and it is MORE THAN 140, you may restart the amlodipine   Increase activity slowly   Complete by: As directed    No wound care   Complete by: As directed        Discharge Exam: Filed Weights   06/24/23 0500 06/25/23 0500 06/26/23 0724  Weight: 111.3 kg 113.4 kg 113.4 kg    General: Pt is alert, awake, not in acute distress Cardiovascular: RRR, nl S1-S2, no murmurs appreciated.   No LE edema.   Respiratory: Normal respiratory rate and rhythm.  CTAB without rales or wheezes. Abdominal: Abdomen soft and non-tender.  No distension or HSM.   Neuro/Psych: Strength symmetric in upper and lower extremities.  Judgment and insight appear normal.   Condition at discharge: good  The results of significant diagnostics  from this hospitalization (including imaging, microbiology, ancillary and laboratory) are listed below for reference.   Imaging Studies: DG Chest 2 View  Result Date: 06/25/2023 CLINICAL DATA:  Shortness of breath EXAM: CHEST - 2 VIEW COMPARISON:  07/05/2022 FINDINGS: Artifact from EKG leads. Mild linear opacification in the right mid chest. There is no edema, consolidation, effusion, or pneumothorax. Normal heart size and mediastinal contours. IMPRESSION: Minimal atelectasis on the right. Electronically Signed   By: Tiburcio Pea M.D.   On: 06/25/2023 22:04   MR BRAIN WO CONTRAST  Result Date: 06/25/2023 CLINICAL DATA:  Seizure, new onset, no history of trauma EXAM: MRI HEAD WITHOUT CONTRAST TECHNIQUE: Multiplanar, multiecho pulse sequences of the brain and surrounding structures were obtained without intravenous contrast. COMPARISON:  09/29/2021 MRI brain FINDINGS: Evaluation is limited by motion. Brain: No restricted diffusion to suggest acute or subacute infarct. No acute hemorrhage, mass, mass effect, or midline shift. No hydrocephalus or extra-axial collection. No hemosiderin deposition to suggest remote hemorrhage. Remote lacunar infarcts in the right basal ganglia and possibly bilateral thalami. Vascular: Normal arterial flow voids. Skull and upper cervical spine: Normal marrow signal. Sinuses/Orbits: Mucosal thickening in the ethmoid air cells. No acute finding in the orbits. Other: The mastoid air cells are well aerated. IMPRESSION: Evaluation is limited by motion. Within this limitation, no acute intracranial process. Electronically Signed   By: Wiliam Ke M.D.   On: 06/25/2023 20:39   Overnight EEG with video  Result Date: 06/25/2023 Charlsie Quest, MD     06/25/2023  7:11 PM Patient Name: DANAEJAH JUBY MRN: [id number] Epilepsy Attending: Charlsie Quest Referring Physician/Provider: Elmer Picker, NP Duration: 06/24/2023 1058 to 06/25/2023 1111 Patient history: 57 y.o. female with PMH  significant for  has a past medical history of Anemia, Arthritis, Diabetes mellitus without complication (HCC), Hypertension, and Stroke (HCC). who presents with few episodes of near syncope in setting of heavy uterine bleeding. Today has 2-3 episodes of starring off that was brief in nature, but no confusion/sedation after spell. EEG to evaluate for seizure Level of alertness: Awake, asleep AEDs during EEG study: None Technical aspects: This EEG study  was done with scalp electrodes positioned according to the 10-20 International system of electrode placement. Electrical activity was reviewed with band pass filter of 1-70Hz , sensitivity of 7 uV/mm, display speed of 73mm/sec with a 60Hz  notched filter applied as appropriate. EEG data were recorded continuously and digitally stored.  Video monitoring was available and reviewed as appropriate. Description: The posterior dominant rhythm consists of 9-10 Hz activity of moderate voltage (25-35 uV) seen predominantly in posterior head regions, symmetric and reactive to eye opening and eye closing. Sleep was characterized by vertex waves, sleep spindles (12 to 14 Hz), maximal frontocentral region. Event button was pressed on 06/24/2023 at 1417 for staring. Concomitant EEG before, during and after the event did not show any EEG changes suggest seizure. Event button was pressed on 06/25/2023 at 0707 for dizziness. Concomitant EEG before, during and after the event did not show any EEG changes suggest seizure. Hyperventilation and photic stimulation were not performed.   IMPRESSION: This study is within normal limits. No seizures or epileptiform discharges were seen throughout the recording. One event was recorded on 06/24/2023 at 1417 during which patient was noted to be staring.  Concomitant EEG did not show any EEG changes suggest seizure.  This was most likely a nonepileptic event. One event was recorded on 06/25/2023 at 0707 during which patient reported dizziness.  Concomitant  EEG did not show any EEG changes suggest seizure.  This was most likely a nonepileptic event. A normal interictal EEG does not exclude the diagnosis of epilepsy. Charlsie Quest    Microbiology: Results for orders placed or performed during the hospital encounter of 06/11/21  SARS CORONAVIRUS 2 (TAT 6-24 HRS) Nasopharyngeal Nasopharyngeal Swab     Status: Abnormal   Collection Time: 06/11/21  1:47 PM   Specimen: Nasopharyngeal Swab  Result Value Ref Range Status   SARS Coronavirus 2 POSITIVE (A) NEGATIVE Final    Comment: (NOTE) SARS-CoV-2 target nucleic acids are DETECTED.  The SARS-CoV-2 RNA is generally detectable in upper and lower respiratory specimens during the acute phase of infection. Positive results are indicative of the presence of SARS-CoV-2 RNA. Clinical correlation with patient history and other diagnostic information is  necessary to determine patient infection status. Positive results do not rule out bacterial infection or co-infection with other viruses.  The expected result is Negative.  Fact Sheet for Patients: HairSlick.no  Fact Sheet for Healthcare Providers: quierodirigir.com  This test is not yet approved or cleared by the [place] FDA and  has been authorized for detection and/or diagnosis of SARS-CoV-2 by FDA under an Emergency Use Authorization (EUA). This EUA will remain  in effect (meaning this test can be used) for the duration of the COVID-19 declaration under Section 564(b)(1) of the Act, 21 U. S.C. section 360bbb-3(b)(1), unless the authorization is terminated or revoked sooner.   Performed at [facility], [address], [place], [place] [address]     Labs: CBC: Recent Labs  Lab 06/20/23 0322 06/22/23 0131 06/22/23 0706 06/22/23 1803 06/23/23 1058 06/24/23 0817 06/25/23 0724  WBC 11.3* 12.2*  --   --  10.9* 11.7* 9.1  NEUTROABS 8.0*  --   --   --   --   --   --    HGB 9.9* 8.0* 7.2* 8.9* 10.2* 9.5* 9.5*  HCT 31.8* 25.6* 23.3* 27.8* 33.0* 30.1* 30.0*  MCV 98.1 99.6  --   --  100.0 96.8 94.0  PLT 413* 399  --   --  346  382 366   Basic Metabolic Panel: Recent Labs  Lab 06/20/23 0322 06/22/23 0131 06/23/23 1058 06/24/23 0817 06/25/23 0724  NA 138 137 135 134* 133*  K 3.7 3.6 3.7 3.8 4.5  CL 107 109 110 108 109  CO2 19* 19* 18* 19* 16*  GLUCOSE 205* 187* 204* 161* 208*  BUN 31* 27* 18 17 20   CREATININE 1.82* 1.63* 1.48* 1.48* 1.50*  CALCIUM 8.4* 8.1* 8.3* 8.3* 8.2*  MG  --   --  1.9 1.7 1.7  PHOS  --   --  2.1* 2.4* 2.6   Liver Function Tests: Recent Labs  Lab 06/23/23 1058  AST 15  ALT 17  ALKPHOS 61  BILITOT 0.5  PROT 6.2*  ALBUMIN 3.1*   CBG: Recent Labs  Lab 06/25/23 1203 06/25/23 1538 06/25/23 2137 06/26/23 0637 06/26/23 1208  GLUCAP 130* 209* 164* 111* 111*    Discharge time spent: approximately 35 minutes spent on discharge counseling, evaluation of patient on day of discharge, and coordination of discharge planning with nursing, social work, pharmacy and case management  Signed: Alberteen Sam, MD [facility] 06/26/2023

## 2023-06-27 DIAGNOSIS — J069 Acute upper respiratory infection, unspecified: Secondary | ICD-10-CM | POA: Diagnosis not present

## 2023-06-27 DIAGNOSIS — D649 Anemia, unspecified: Secondary | ICD-10-CM | POA: Diagnosis not present

## 2023-06-27 DIAGNOSIS — U071 COVID-19: Secondary | ICD-10-CM | POA: Diagnosis not present

## 2023-06-27 DIAGNOSIS — E8941 Symptomatic postprocedural ovarian failure: Secondary | ICD-10-CM | POA: Diagnosis not present

## 2023-06-27 DIAGNOSIS — D259 Leiomyoma of uterus, unspecified: Secondary | ICD-10-CM | POA: Diagnosis not present

## 2023-06-27 DIAGNOSIS — N939 Abnormal uterine and vaginal bleeding, unspecified: Secondary | ICD-10-CM | POA: Diagnosis not present

## 2023-06-28 DIAGNOSIS — F331 Major depressive disorder, recurrent, moderate: Secondary | ICD-10-CM | POA: Diagnosis not present

## 2023-06-28 DIAGNOSIS — D500 Iron deficiency anemia secondary to blood loss (chronic): Secondary | ICD-10-CM | POA: Diagnosis not present

## 2023-06-28 DIAGNOSIS — E1169 Type 2 diabetes mellitus with other specified complication: Secondary | ICD-10-CM | POA: Diagnosis not present

## 2023-06-28 DIAGNOSIS — D259 Leiomyoma of uterus, unspecified: Secondary | ICD-10-CM | POA: Diagnosis not present

## 2023-06-28 DIAGNOSIS — E1121 Type 2 diabetes mellitus with diabetic nephropathy: Secondary | ICD-10-CM | POA: Diagnosis not present

## 2023-06-28 DIAGNOSIS — I10 Essential (primary) hypertension: Secondary | ICD-10-CM | POA: Diagnosis not present

## 2023-06-28 DIAGNOSIS — E1165 Type 2 diabetes mellitus with hyperglycemia: Secondary | ICD-10-CM | POA: Diagnosis not present

## 2023-06-28 DIAGNOSIS — E782 Mixed hyperlipidemia: Secondary | ICD-10-CM | POA: Diagnosis not present

## 2023-06-28 DIAGNOSIS — D509 Iron deficiency anemia, unspecified: Secondary | ICD-10-CM | POA: Diagnosis not present

## 2023-06-28 DIAGNOSIS — U071 COVID-19: Secondary | ICD-10-CM | POA: Diagnosis not present

## 2023-07-02 DIAGNOSIS — N921 Excessive and frequent menstruation with irregular cycle: Secondary | ICD-10-CM | POA: Diagnosis not present

## 2023-07-02 DIAGNOSIS — N939 Abnormal uterine and vaginal bleeding, unspecified: Secondary | ICD-10-CM | POA: Diagnosis not present

## 2023-07-06 ENCOUNTER — Telehealth: Payer: Medicare HMO | Admitting: Physician Assistant

## 2023-07-06 DIAGNOSIS — R42 Dizziness and giddiness: Secondary | ICD-10-CM

## 2023-07-06 DIAGNOSIS — R051 Acute cough: Secondary | ICD-10-CM

## 2023-07-06 DIAGNOSIS — R0609 Other forms of dyspnea: Secondary | ICD-10-CM

## 2023-07-06 DIAGNOSIS — R6889 Other general symptoms and signs: Secondary | ICD-10-CM

## 2023-07-06 MED ORDER — AZITHROMYCIN 250 MG PO TABS
ORAL_TABLET | ORAL | 0 refills | Status: AC
Start: 2023-07-06 — End: 2023-07-11

## 2023-07-06 NOTE — Patient Instructions (Signed)
  Annette Munoz, thank you for joining Margaretann Loveless, PA-C for today's virtual visit.  While this provider is not your primary care provider (PCP), if your PCP is located in our provider database this encounter information will be shared with them immediately following your visit.   A [facility] MyChart account gives you access to today's visit and all your visits, tests, and labs performed at [facility] " click here if you don't have a [facility] MyChart account or go to [email or web address]  Consent: (Patient) Annette Munoz provided verbal consent for this virtual visit at the beginning of the encounter.  Current Medications:  Current Outpatient Medications:    azithromycin (ZITHROMAX) 250 MG tablet, Take 2 tablets on day 1, then 1 tablet daily on days 2 through 5, Disp: 6 tablet, Rfl: 0   Medications ordered in this encounter:  Meds ordered this encounter  Medications   azithromycin (ZITHROMAX) 250 MG tablet    Sig: Take 2 tablets on day 1, then 1 tablet daily on days 2 through 5    Dispense:  6 tablet    Refill:  0    Order Specific Question:   Supervising Provider    Answer:   Merrilee Jansky [id number]     *If you need refills on other medications prior to your next appointment, please contact your pharmacy*  Follow-Up: Call back or seek an in-person evaluation if the symptoms worsen or if the condition fails to improve as anticipated.  [facility] [phone]   If you have been instructed to have an in-person evaluation today at a local Urgent Care facility, please use the link below. It will take you to a list of all of our available [facility], including address, phone number and hours of operation. Please do not delay care.  [facility]  If you or a family member do not have a primary care provider, use the link below to schedule a visit and establish care. When you choose a [facility] primary care  physician or advanced practice provider, you gain a long-term partner in health. Find a Primary Care Provider  Learn more about [facility]'s in-office and virtual care options:  - Get Care Now

## 2023-07-06 NOTE — Progress Notes (Signed)
Virtual Visit Consent   Annette Munoz, you are scheduled for a virtual visit with a [facility] provider today. Just as with appointments in the office, your consent must be obtained to participate. Your consent will be active for this visit and any virtual visit you may have with one of our providers in the next 365 days. If you have a MyChart account, a copy of this consent can be sent to you electronically.  As this is a virtual visit, video technology does not allow for your provider to perform a traditional examination. This may limit your provider's ability to fully assess your condition. If your provider identifies any concerns that need to be evaluated in person or the need to arrange testing (such as labs, EKG, etc.), we will make arrangements to do so. Although advances in technology are sophisticated, we cannot ensure that it will always work on either your end or our end. If the connection with a video visit is poor, the visit may have to be switched to a telephone visit. With either a video or telephone visit, we are not always able to ensure that we have a secure connection.  By engaging in this virtual visit, you consent to the provision of healthcare and authorize for your insurance to be billed (if applicable) for the services provided during this visit. Depending on your insurance coverage, you may receive a charge related to this service.  I need to obtain your verbal consent now. Are you willing to proceed with your visit today? Annette Munoz has provided verbal consent on 07/06/2023 for a virtual visit (video or telephone). Margaretann Loveless, PA-C  Date: 07/06/2023 6:52 PM  Virtual Visit via Video Note   I, Margaretann Loveless, connected with  Annette Munoz  ([id number], Jan 04, 1966) on 07/06/23 at  4:00 PM EDT by a video-enabled telemedicine application and verified that I am speaking with the correct person using two identifiers.  Location: Patient: Virtual Visit Location Patient:  Home Provider: Virtual Visit Location Provider: Home Office   I discussed the limitations of evaluation and management by telemedicine and the availability of in person appointments. The patient expressed understanding and agreed to proceed.    History of Present Illness: Annette Munoz is a 57 y.o. who identifies as a female who was assigned female at birth, and is being seen today for continued dizziness, lightheadedness (particularly with changing of positions), syncope (none recent, but seen before for this), and elevated blood glucose readings.  Was hospitalized 06/21/23 - 06/26/23 due to acute anemia secondary to abnormal uterine bleeding from uterine fibroids. Required 3 different blood transfusions. Maintained HgB at 9.5 at discharge. Started on HRT that has stopped bleeding at this time. Has not had labs rechecked since discharge.  Was having similar symptoms but also having syncopal episodes. While hospitalized her glucose readings had been elevated in the beginning, but were doing well as she was on sliding scale insulin. She was discharged home on her [place] and [place].   Once home, she did develop a cough and runny nose. She did test positive for Covid 19. She did complete Paxlovid.  Since she has had continued cough and dyspnea on exertion. She has started to have dizziness again with changing of positions. Also fasting glucose in the mornings have ranged from 160-220 and a random glucose today after lunch was 341. She continues to have general malaise and feel unwell.  She is scheduled to have labs rechecked on 07/10/23 and will see her PCP  in follow up from hospitalization on 07/12/23.   Problems: There are no problems to display for this patient.   Allergies:  Allergies  Allergen Reactions   Lisinopril    Medications:  Current Outpatient Medications:    azithromycin (ZITHROMAX) 250 MG tablet, Take 2 tablets on day 1, then 1 tablet daily on days 2 through 5, Disp: 6 tablet,  Rfl: 0  Observations/Objective: Patient is well-developed, well-nourished in no acute distress.  Resting comfortably at home. Does appear to not feel well, daughter at bedside with her Head is normocephalic, atraumatic.  No labored breathing.  Speech is clear and coherent with logical content.  Patient is alert and oriented at baseline.    Assessment and Plan: 1. Acute cough - azithromycin (ZITHROMAX) 250 MG tablet; Take 2 tablets on day 1, then 1 tablet daily on days 2 through 5  Dispense: 6 tablet; Refill: 0  2. Dizziness  3. DOE (dyspnea on exertion)  4. Generally unwell  - Discussed could be multifactorial with potential dehydration, anemia worsening again, possible secondary infection following Covid 19. - Advised to push fluids, electrolyte beverages - Zpack added for potential secondary infection such as bacterial bronchitis or atypical pneumonia - Compression stockings - Change positions slowly - Keep scheduled follow up with PCP - Seek immediate in person evaluation if symptoms do worsen  Follow Up Instructions: I discussed the assessment and treatment plan with the patient. The patient was provided an opportunity to ask questions and all were answered. The patient agreed with the plan and demonstrated an understanding of the instructions.  A copy of instructions were sent to the patient via MyChart unless otherwise noted below.    The patient was advised to call back or seek an in-person evaluation if the symptoms worsen or if the condition fails to improve as anticipated.  Time:  I spent 18 minutes with the patient via telehealth technology discussing the above problems/concerns.    Margaretann Loveless, PA-C

## 2023-07-09 ENCOUNTER — Encounter (HOSPITAL_COMMUNITY): Payer: Self-pay | Admitting: Internal Medicine

## 2023-07-10 DIAGNOSIS — D509 Iron deficiency anemia, unspecified: Secondary | ICD-10-CM | POA: Diagnosis not present

## 2023-07-10 DIAGNOSIS — E1165 Type 2 diabetes mellitus with hyperglycemia: Secondary | ICD-10-CM | POA: Diagnosis not present

## 2023-07-10 DIAGNOSIS — E785 Hyperlipidemia, unspecified: Secondary | ICD-10-CM | POA: Diagnosis not present

## 2023-07-10 DIAGNOSIS — I10 Essential (primary) hypertension: Secondary | ICD-10-CM | POA: Diagnosis not present

## 2023-07-10 DIAGNOSIS — R5383 Other fatigue: Secondary | ICD-10-CM | POA: Diagnosis not present

## 2023-07-12 ENCOUNTER — Other Ambulatory Visit (HOSPITAL_COMMUNITY): Payer: Self-pay

## 2023-07-12 DIAGNOSIS — Z789 Other specified health status: Secondary | ICD-10-CM | POA: Diagnosis not present

## 2023-07-12 DIAGNOSIS — E1169 Type 2 diabetes mellitus with other specified complication: Secondary | ICD-10-CM | POA: Diagnosis not present

## 2023-07-12 DIAGNOSIS — D259 Leiomyoma of uterus, unspecified: Secondary | ICD-10-CM | POA: Diagnosis not present

## 2023-07-12 DIAGNOSIS — I10 Essential (primary) hypertension: Secondary | ICD-10-CM | POA: Diagnosis not present

## 2023-07-12 DIAGNOSIS — N938 Other specified abnormal uterine and vaginal bleeding: Secondary | ICD-10-CM | POA: Diagnosis not present

## 2023-07-12 DIAGNOSIS — E1165 Type 2 diabetes mellitus with hyperglycemia: Secondary | ICD-10-CM | POA: Diagnosis not present

## 2023-07-12 DIAGNOSIS — I129 Hypertensive chronic kidney disease with stage 1 through stage 4 chronic kidney disease, or unspecified chronic kidney disease: Secondary | ICD-10-CM | POA: Diagnosis not present

## 2023-07-12 DIAGNOSIS — E1122 Type 2 diabetes mellitus with diabetic chronic kidney disease: Secondary | ICD-10-CM | POA: Diagnosis not present

## 2023-07-12 DIAGNOSIS — E782 Mixed hyperlipidemia: Secondary | ICD-10-CM | POA: Diagnosis not present

## 2023-07-13 DIAGNOSIS — E1165 Type 2 diabetes mellitus with hyperglycemia: Secondary | ICD-10-CM | POA: Diagnosis not present

## 2023-07-17 DIAGNOSIS — E1165 Type 2 diabetes mellitus with hyperglycemia: Secondary | ICD-10-CM | POA: Diagnosis not present

## 2023-07-17 DIAGNOSIS — N1832 Chronic kidney disease, stage 3b: Secondary | ICD-10-CM | POA: Diagnosis not present

## 2023-07-17 DIAGNOSIS — J9811 Atelectasis: Secondary | ICD-10-CM | POA: Diagnosis not present

## 2023-07-17 DIAGNOSIS — D259 Leiomyoma of uterus, unspecified: Secondary | ICD-10-CM | POA: Diagnosis not present

## 2023-07-17 DIAGNOSIS — N920 Excessive and frequent menstruation with regular cycle: Secondary | ICD-10-CM | POA: Diagnosis not present

## 2023-07-17 DIAGNOSIS — D500 Iron deficiency anemia secondary to blood loss (chronic): Secondary | ICD-10-CM | POA: Diagnosis not present

## 2023-07-17 DIAGNOSIS — F32A Depression, unspecified: Secondary | ICD-10-CM | POA: Diagnosis not present

## 2023-07-17 DIAGNOSIS — E785 Hyperlipidemia, unspecified: Secondary | ICD-10-CM | POA: Diagnosis not present

## 2023-07-17 DIAGNOSIS — Z6841 Body Mass Index (BMI) 40.0 and over, adult: Secondary | ICD-10-CM | POA: Diagnosis not present

## 2023-07-17 DIAGNOSIS — E1122 Type 2 diabetes mellitus with diabetic chronic kidney disease: Secondary | ICD-10-CM | POA: Diagnosis not present

## 2023-07-17 DIAGNOSIS — I129 Hypertensive chronic kidney disease with stage 1 through stage 4 chronic kidney disease, or unspecified chronic kidney disease: Secondary | ICD-10-CM | POA: Diagnosis not present

## 2023-07-26 DIAGNOSIS — I10 Essential (primary) hypertension: Secondary | ICD-10-CM | POA: Diagnosis not present

## 2023-07-26 DIAGNOSIS — E1121 Type 2 diabetes mellitus with diabetic nephropathy: Secondary | ICD-10-CM | POA: Diagnosis not present

## 2023-07-26 DIAGNOSIS — E1165 Type 2 diabetes mellitus with hyperglycemia: Secondary | ICD-10-CM | POA: Diagnosis not present

## 2023-07-26 DIAGNOSIS — E1122 Type 2 diabetes mellitus with diabetic chronic kidney disease: Secondary | ICD-10-CM | POA: Diagnosis not present

## 2023-07-26 DIAGNOSIS — I152 Hypertension secondary to endocrine disorders: Secondary | ICD-10-CM | POA: Diagnosis not present

## 2023-07-26 DIAGNOSIS — D500 Iron deficiency anemia secondary to blood loss (chronic): Secondary | ICD-10-CM | POA: Diagnosis not present

## 2023-07-26 DIAGNOSIS — E1159 Type 2 diabetes mellitus with other circulatory complications: Secondary | ICD-10-CM | POA: Diagnosis not present

## 2023-07-26 DIAGNOSIS — N938 Other specified abnormal uterine and vaginal bleeding: Secondary | ICD-10-CM | POA: Diagnosis not present

## 2023-07-26 DIAGNOSIS — I129 Hypertensive chronic kidney disease with stage 1 through stage 4 chronic kidney disease, or unspecified chronic kidney disease: Secondary | ICD-10-CM | POA: Diagnosis not present

## 2023-07-26 DIAGNOSIS — F331 Major depressive disorder, recurrent, moderate: Secondary | ICD-10-CM | POA: Diagnosis not present

## 2023-07-30 DIAGNOSIS — N939 Abnormal uterine and vaginal bleeding, unspecified: Secondary | ICD-10-CM | POA: Diagnosis not present

## 2023-08-09 ENCOUNTER — Other Ambulatory Visit: Payer: Self-pay | Admitting: Adult Health

## 2023-08-09 DIAGNOSIS — N1832 Chronic kidney disease, stage 3b: Secondary | ICD-10-CM | POA: Diagnosis not present

## 2023-08-13 DIAGNOSIS — E785 Hyperlipidemia, unspecified: Secondary | ICD-10-CM | POA: Diagnosis not present

## 2023-08-13 DIAGNOSIS — I639 Cerebral infarction, unspecified: Secondary | ICD-10-CM | POA: Diagnosis not present

## 2023-08-13 DIAGNOSIS — E1122 Type 2 diabetes mellitus with diabetic chronic kidney disease: Secondary | ICD-10-CM | POA: Diagnosis not present

## 2023-08-13 DIAGNOSIS — E669 Obesity, unspecified: Secondary | ICD-10-CM | POA: Diagnosis not present

## 2023-08-13 DIAGNOSIS — I129 Hypertensive chronic kidney disease with stage 1 through stage 4 chronic kidney disease, or unspecified chronic kidney disease: Secondary | ICD-10-CM | POA: Diagnosis not present

## 2023-08-13 DIAGNOSIS — N1832 Chronic kidney disease, stage 3b: Secondary | ICD-10-CM | POA: Diagnosis not present

## 2023-09-03 DIAGNOSIS — N939 Abnormal uterine and vaginal bleeding, unspecified: Secondary | ICD-10-CM | POA: Diagnosis not present

## 2023-09-03 DIAGNOSIS — Z1331 Encounter for screening for depression: Secondary | ICD-10-CM | POA: Diagnosis not present

## 2023-09-03 DIAGNOSIS — Z0000 Encounter for general adult medical examination without abnormal findings: Secondary | ICD-10-CM | POA: Diagnosis not present

## 2023-09-03 DIAGNOSIS — E1165 Type 2 diabetes mellitus with hyperglycemia: Secondary | ICD-10-CM | POA: Diagnosis not present

## 2023-09-03 DIAGNOSIS — Z1339 Encounter for screening examination for other mental health and behavioral disorders: Secondary | ICD-10-CM | POA: Diagnosis not present

## 2023-09-10 DIAGNOSIS — K5900 Constipation, unspecified: Secondary | ICD-10-CM | POA: Diagnosis not present

## 2023-09-10 DIAGNOSIS — E1122 Type 2 diabetes mellitus with diabetic chronic kidney disease: Secondary | ICD-10-CM | POA: Diagnosis not present

## 2023-09-10 DIAGNOSIS — Z789 Other specified health status: Secondary | ICD-10-CM | POA: Diagnosis not present

## 2023-09-10 DIAGNOSIS — E782 Mixed hyperlipidemia: Secondary | ICD-10-CM | POA: Diagnosis not present

## 2023-09-10 DIAGNOSIS — E1165 Type 2 diabetes mellitus with hyperglycemia: Secondary | ICD-10-CM | POA: Diagnosis not present

## 2023-09-10 DIAGNOSIS — E559 Vitamin D deficiency, unspecified: Secondary | ICD-10-CM | POA: Diagnosis not present

## 2023-09-10 DIAGNOSIS — R0683 Snoring: Secondary | ICD-10-CM | POA: Diagnosis not present

## 2023-09-10 DIAGNOSIS — D509 Iron deficiency anemia, unspecified: Secondary | ICD-10-CM | POA: Diagnosis not present

## 2023-09-10 DIAGNOSIS — I129 Hypertensive chronic kidney disease with stage 1 through stage 4 chronic kidney disease, or unspecified chronic kidney disease: Secondary | ICD-10-CM | POA: Diagnosis not present

## 2023-09-10 DIAGNOSIS — N189 Chronic kidney disease, unspecified: Secondary | ICD-10-CM | POA: Diagnosis not present

## 2023-09-10 DIAGNOSIS — R400 Somnolence: Secondary | ICD-10-CM | POA: Diagnosis not present

## 2023-09-19 DIAGNOSIS — G4730 Sleep apnea, unspecified: Secondary | ICD-10-CM | POA: Diagnosis not present

## 2023-09-20 DIAGNOSIS — H6123 Impacted cerumen, bilateral: Secondary | ICD-10-CM | POA: Diagnosis not present

## 2023-09-20 DIAGNOSIS — Z0000 Encounter for general adult medical examination without abnormal findings: Secondary | ICD-10-CM | POA: Diagnosis not present

## 2023-10-15 DIAGNOSIS — Z9181 History of falling: Secondary | ICD-10-CM | POA: Diagnosis not present

## 2023-10-15 DIAGNOSIS — I6930 Unspecified sequelae of cerebral infarction: Secondary | ICD-10-CM | POA: Diagnosis not present

## 2023-10-15 DIAGNOSIS — G4733 Obstructive sleep apnea (adult) (pediatric): Secondary | ICD-10-CM | POA: Diagnosis not present

## 2023-11-01 DIAGNOSIS — G4733 Obstructive sleep apnea (adult) (pediatric): Secondary | ICD-10-CM | POA: Diagnosis not present

## 2023-11-14 DIAGNOSIS — I639 Cerebral infarction, unspecified: Secondary | ICD-10-CM | POA: Diagnosis not present

## 2023-11-14 DIAGNOSIS — N1832 Chronic kidney disease, stage 3b: Secondary | ICD-10-CM | POA: Diagnosis not present

## 2023-11-14 DIAGNOSIS — G4733 Obstructive sleep apnea (adult) (pediatric): Secondary | ICD-10-CM | POA: Diagnosis not present

## 2023-11-14 DIAGNOSIS — E1122 Type 2 diabetes mellitus with diabetic chronic kidney disease: Secondary | ICD-10-CM | POA: Diagnosis not present

## 2023-11-14 DIAGNOSIS — I10 Essential (primary) hypertension: Secondary | ICD-10-CM | POA: Diagnosis not present

## 2023-11-28 DIAGNOSIS — J069 Acute upper respiratory infection, unspecified: Secondary | ICD-10-CM | POA: Diagnosis not present

## 2023-11-28 DIAGNOSIS — B9689 Other specified bacterial agents as the cause of diseases classified elsewhere: Secondary | ICD-10-CM | POA: Diagnosis not present

## 2023-11-28 DIAGNOSIS — J329 Chronic sinusitis, unspecified: Secondary | ICD-10-CM | POA: Diagnosis not present

## 2023-11-28 DIAGNOSIS — I10 Essential (primary) hypertension: Secondary | ICD-10-CM | POA: Diagnosis not present

## 2023-11-29 DIAGNOSIS — R739 Hyperglycemia, unspecified: Secondary | ICD-10-CM | POA: Diagnosis not present

## 2023-11-29 DIAGNOSIS — I10 Essential (primary) hypertension: Secondary | ICD-10-CM | POA: Diagnosis not present

## 2023-12-13 DIAGNOSIS — E119 Type 2 diabetes mellitus without complications: Secondary | ICD-10-CM | POA: Diagnosis not present

## 2023-12-15 DIAGNOSIS — G4733 Obstructive sleep apnea (adult) (pediatric): Secondary | ICD-10-CM | POA: Diagnosis not present

## 2024-01-04 DIAGNOSIS — G4733 Obstructive sleep apnea (adult) (pediatric): Secondary | ICD-10-CM | POA: Diagnosis not present

## 2024-01-07 DIAGNOSIS — H25813 Combined forms of age-related cataract, bilateral: Secondary | ICD-10-CM | POA: Diagnosis not present

## 2024-01-07 DIAGNOSIS — H5213 Myopia, bilateral: Secondary | ICD-10-CM | POA: Diagnosis not present

## 2024-01-07 DIAGNOSIS — E119 Type 2 diabetes mellitus without complications: Secondary | ICD-10-CM | POA: Diagnosis not present

## 2024-01-11 DIAGNOSIS — E119 Type 2 diabetes mellitus without complications: Secondary | ICD-10-CM | POA: Diagnosis not present

## 2024-01-16 ENCOUNTER — Other Ambulatory Visit: Payer: Self-pay | Admitting: Family Medicine

## 2024-01-16 DIAGNOSIS — Z1231 Encounter for screening mammogram for malignant neoplasm of breast: Secondary | ICD-10-CM

## 2024-01-21 DIAGNOSIS — N939 Abnormal uterine and vaginal bleeding, unspecified: Secondary | ICD-10-CM | POA: Diagnosis not present

## 2024-01-21 DIAGNOSIS — D509 Iron deficiency anemia, unspecified: Secondary | ICD-10-CM | POA: Diagnosis not present

## 2024-01-22 ENCOUNTER — Ambulatory Visit: Attending: Family Medicine | Admitting: Physical Therapy

## 2024-01-22 ENCOUNTER — Encounter: Payer: Self-pay | Admitting: Physical Therapy

## 2024-01-22 DIAGNOSIS — R2689 Other abnormalities of gait and mobility: Secondary | ICD-10-CM | POA: Insufficient documentation

## 2024-01-22 DIAGNOSIS — I69351 Hemiplegia and hemiparesis following cerebral infarction affecting right dominant side: Secondary | ICD-10-CM | POA: Insufficient documentation

## 2024-01-22 DIAGNOSIS — R2681 Unsteadiness on feet: Secondary | ICD-10-CM | POA: Diagnosis not present

## 2024-01-22 NOTE — Therapy (Signed)
 OUTPATIENT PHYSICAL THERAPY WHEELCHAIR EVALUATION   Patient Name: Annette Munoz MRN: [id number] DOB:02-17-1966, 58 y.o., female Today's Date: 01/22/2024  END OF SESSION:  PT End of Session - 01/22/24 0854     Visit Number 1    Number of Visits 1    Authorization Type [employer]    Authorization Time Period 01-22-24 - 02-21-24    PT Start Time 0800    PT Stop Time 0846    PT Time Calculation (min) 46 min    Activity Tolerance Patient tolerated treatment well    Behavior During Therapy [facility] for tasks assessed/performed             Past Medical History:  Diagnosis Date   Anemia    Arthritis    Diabetes mellitus without complication (HCC)    Hypertension    Stroke [facility] [address])    Past Surgical History:  Procedure Laterality Date   CESAREAN SECTION     TEE WITHOUT CARDIOVERSION N/A 08/10/2015   Procedure: TRANSESOPHAGEAL ECHOCARDIOGRAM (TEE);  Surgeon: Chrystie Nose, MD;  Location: [facility] ENDOSCOPY;  Service: Cardiovascular;  Laterality: N/A;   Patient Active Problem List   Diagnosis Date Noted   Iron deficiency anemia due to chronic blood loss 06/25/2023   CKD stage 3b, GFR 30-44 ml/min (HCC) 06/25/2023   Menorrhagia due to fibroids 06/25/2023   Syncope 06/22/2023   Intracranial vascular stenosis 12/05/2016   Depression 12/05/2016   Seizure-like activity (HCC) 06/28/2016   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)    Gait difficulty    Memory loss 05/22/2016   Confusion    Acute ischemic stroke (HCC) 05/18/2016   Morbid obesity (HCC) 09/30/2015   Diabetes mellitus (HCC)    Essential hypertension    Hyperlipidemia    Cerebrovascular disease    Cerebral infarction (HCC) 08/07/2015   Pure hypercholesterolemia 10/04/2009    PCP: Lewis Moccasin., MD  REFERRING PROVIDER: Dow Adolph, FNP  REFERRING DIAG: falls risk impaired mobility  THERAPY DIAG:  Hemiplegia and hemiparesis following cerebral infarction affecting right dominant side  (HCC)  Other abnormalities of gait and mobility  Unsteadiness on feet  ONSET DATE: 2016 for onset of initial CVA;  Referral date for w/c eval 12-12-23  Rationale for Evaluation and Treatment: Habilitation  SUBJECTIVE:                                                                                                                                                                                           SUBJECTIVE STATEMENT: Pt presents for wheelchair evaluation - accompanied by her daughter; pt has had 3 strokes - in 2016  and 2 in 2017; daughter reports pt drags her Rt foot   PERTINENT HISTORY:  See above PAIN:  Are you having pain? Occasional back pain and muscle spasms in abdomen and legs  PRECAUTIONS: Fall  WEIGHT BEARING RESTRICTIONS: No  FALLS:  Has patient fallen in last 6 months?  No fall but 1 near fall  - has had several trips   LIVING ENVIRONMENT: Lives with: lives with their daughter Lives in: House/apartment Stairs: Yes: External: 12 in front and 3 in back steps; on right going up and in back there are hand rails on both sides Has following equipment at home: Single point cane  OCCUPATION: on disability  PLOF: Independent prior to CVA in 2016  PATIENT GOALS: obtain power wheelchair    PATIENT INFORMATION: This Evaluation form will serve as the LMN for the following suppliers:  Supplier:  [employer] Contact Person:  Joseph Pierini Phone:  [phone]   Reason for Referral: Patient/caregiver Goals:  obtain power wheelchair - requests one that can be transported Patient was seen for face-to-face evaluation for new power wheelchair.  Also present was [employer] to discuss recommendations and wheelchair options.  Further paperwork was completed and sent to vendor.  Patient appears to qualify for power mobility device at this time per objective findings.    MEDICAL HISTORY: Diagnosis:  s/p multiple CVA's:  syncope:  DM: Arthritis:  HTN Primary Diagnosis Onset:  2016 for initial CVA [] Progressive Disease Relevant Past and Future Surgeries:  TEE without cardioversion (08-09-25) Height: 5'2" Weight:  220# Explain and recent changes or trends in weight:   Relevant History including falls:  Daughter reports pt has not had any falls in past 6 months but reports several "near falls" and tripping due to incomplete clearance of Rt foot     HOME ENVIRONMENT: [x] House  [] Condo/town home  [] Apartment  [] Assisted Living    [] Lives Alone [x]  Lives with Others                                                    Hours with caregiver:   [x] Home is accessible to patient            Stairs  [x] Yes []  No     Ramp [] Yes [x] No Comments:     COMMUNITY ADL: TRANSPORTATION: [x] Car    [] Van    [] Public Transportation    [] Adapted w/c Lift   [] Ambulance   [x] Other:  also has SUV     [] Sits in wheelchair during transport  Employment/School:     Specific requirements pertaining to mobility                                                     Other:                                       FUNCTIONAL/SENSORY PROCESSING SKILLS:  Handedness:   [x] Right     [] Left    [] NA  Comments:  Functional Processing Skills for Wheeled Mobility [x] Processing Skills are adequate for safe wheelchair operation  Areas of concern than may interfere with safe operation of wheelchair Description of problem   []  Attention to environment     [] Judgment     []  Hearing  []  Vision or visual processing    [] Motor Planning  []  Fluctuations in Behavior                                                   VERBAL COMMUNICATION: [x] WFL receptive [x]  WFL expressive [] Understandable  [] Difficult to understand  [] non-communicative []  Uses an augmented communication device    CURRENT SEATING / MOBILITY: Current Mobility Base:   [x] None  [] Dependent  [] Manual  [] Scooter  [] Power   Type of Control:                       Manufacturer:                          Size:                         Age:                           Current Condition of Mobility Base:                                                                                                                     Current Wheelchair components:                                                                                                                                   Describe posture in present seating system:                                                                            SENSATION and SKIN ISSUES: Sensation [x] Intact [] Impaired []   Absent   Level of sensation:                           Pressure Relief: Able to perform effective pressure relief :   [x] Yes  []  No Method:                                                                              If not, Why?:                                                                          Skin Issues/Skin Integrity Current Skin Issues   [] Yes [x] No  [] Intact []  Red area []  Open Area  [] Scar Tissue [] At risk from prolonged sitting  Where                              History of Skin Issues   [] Yes [x] No  Where                                         When                                               Hx of skin flap surgeries [] Yes [x] No  Where                  When                                                  Limited sitting tolerance [] Yes [x] No Hours spent sitting in wheelchair daily:                                                         Complaint of Pain:  Please describe:    occasional pain in back and c/o intermittent pain due to spasms in abdomen and legs  Swelling/Edema:   None                                                                                                                                          ADL STATUS (in reference to wheelchair use):  Indep Assist Unable Indep with Equip Not  assessed Comments  Dressing                X                                         Occasional assist needed with lower body dressing                Eating      X                                                                                                                        Toileting                                         X                          Uses BSC at nighttime                                                            Bathing                 X                                              Assist with transfer to get in and out and assist with bathing lower legs/feet; has grab bars in walk in tub and hand held shower  Grooming/ Hygiene                                     X                          Performs in seated position due to fatigue                                                               Meal Prep                            X                                                                                              IADLS                  X                                             Uses motorized carts in stores                                                   Bowel Management: [x] Continent  [] Incontinent  [] Accidents Comments:                                                  Bladder Management: [] Continent  [] Incontinent  [x] Accidents Comments: urgency incontinence                                             WHEELCHAIR SKILLS: Manual w/c Propulsion: [] UE or LE strength and endurance sufficient to participate in ADLs using manual wheelchair Arm :  [] left [] right  [] Both                                   Foot:   [] left [] right  [] Both  Distance:   Operate Scooter: []  Strength, hand grip, balance and transfer appropriate for use [] Living environment is accessible for use of scooter  Operate Power w/c:  [x]  Std. Joystick   []  Alternative Controls Indep [x]  Assist []  Dependent/ Unable []   N/A []  [x] Safe          [  x] Functional      Distance:                Bed confined without wheelchair []  Yes [x]  No   STRENGTH/RANGE OF MOTION:  Range of Motion Strength  Shoulder    WNL's                                                  Rt shoulder flexors 4-/5:  abdct.= 4-/5:  Lt shoulder flexors 4/5:  abdct. 4/5                                                       Elbow  WNL's                                                    WFL's                                                          Wrist/Hand   WNL's                                                                    WFL's                                                                   Hip     WFL's                                                          Rt hip flexors 4-/5:                                                               Knee    WFL's  Rt quads 4/5: Rt hamstrings 3+/5                                                                                                      Ankle Lt ankle AROM WNL's; decreased Rt ankle dorsiflexion due to weakness     Rt dorsiflexors 3-/5:  Lt ankle musc. WFL's                                                                  MOBILITY/BALANCE:  []  Patient is totally dependent for mobility                                                                                               Balance Transfers Ambulation  Sitting Balance: Standing Balance: [x]  Independent []  Independent/Modified Independent  [x]  WFL     [x]  WFL []  Supervision [x]  Supervision  []  Uses UE for balance  []  Supervision []  Min Assist []  Ambulates with Assist                           []  Min Assist []  Min assist []  Mod Assist [x]  Ambulates with Device:  []  RW   []  StW   [x]  Cane   []                 []  Mod Assist []  Mod assist []  Max assist   []  Max Assist []  Max assist []  Dependent [x]  Indep. Short Distance Only  []  Unable []  Unable []  Lift / Sling Required  Distance (in feet)    20'                         []  Sliding board []  Unable to Ambulate: (Explain:  Cardio Status:  [x] Intact  []  Impaired   []  NA                              Respiratory Status:  [] Intact   [x] Impaired   [] NA     has sleep apnea - uses Cpap machine                                Orthotics/Prosthetics:  Comments (Address manual vs power w/c vs scooter):  Pt is unable to ambulate functionally and safely due to Rt hemiparesis secondary s/p multiple CVA's sustained in 2016 & 2017.  Pt is currently ambulating with use of SPC but is at risk for fall per TUG score of 19.84 secs; pt had 1 episode of Rt foot catching floor during this test but was able to independently recover.  Pt would benefit from a power wheelchair to increase independence and safety with mobility and ADL's in her home and reduce her current fall risk with these activities.  Pt is unable to functionally and effectively propel a manual wheelchair due to Rt shoulder weakness and also due to her large anatomical size (morbid obesity).  Pt is unable to use a scooter due to her inability to safely transfer on and off the platform of a scooter; her home is not accessible for the large turning radius of a scooter.  A group 2 power wheelchair is medically necessary for independence and safety with ADL's & household mobility.                                                Anterior / Posterior Obliquity Rotation-Pelvis  PELVIS    [x] Neutral  []  Posterior  []  Anterior     [x] WFL  [] Right Elevated  [] Left Elevated   [x] WFL  [] Right Anterior []   Left Anterior    []  Fixed []  Partly Flexible [x]  Flexible  []  Other  []  Fixed  []  Partly Flexible  [x]  Flexible []  Other  []  Fixed  []  Partly Flexible  [x]  Flexible []  Other  TRUNK [x] WFL [] Thoracic Kyphosis [] Lumbar Lordosis   [x]  WFL [] Convex Right [] Convex  Left   [] c-curve [] s-curve [] multiple  [x]  Neutral []  Left-anterior []  Right-anterior    []  Fixed [x]  Flexible []  Partly Flexible       Other  []  Fixed [x]  Flexible []  Partly Flexible []  Other  []  Fixed           [x]  Flexible []  Partly Flexible []  Other   Position Windswept   HIPS  []  Neutral [x]  Abduct []  ADduct [x]  Neutral []  Right []  Left       []  Fixed  []  Partly Flexible             []  Dislocated [x]  Flexible []  Subluxed    []  Fixed []  Partly Flexible  [x]  Flexible []  Other              Foot Positioning Knee Positioning   Knees and  Feet  [x]  WFL [x] Left [x] Right= externally rotated but pt able to internally rotate [x]  WFL [] Left [] Right   KNEES ROM concerns: ROM concerns:   & Dorsi-Flexed                    [] Lt [] Rt                                  FEET Plantar Flexed                  [] Lt [] Rt     Inversion                    [] Lt [] Rt     Eversion                    []   Lt [] Rt    HEAD [x]  Functional [x]  Good Head Control   & []  Flexed         []  Extended []  Adequate Head Control   NECK []  Rotated  Lt  []  Lat Flexed Lt []  Rotated  Rt []  Lat Flexed Rt []  Limited Head Control    []  Cervical Hyperextension []  Absent  Head Control    SHOULDERS ELBOWS WRIST& HAND         Left     Right    Left     Right  U/E [x] Functional  Left            [x] Functional  Right                                 [] Fisting             [] Fisting     [] elevated Left [] depressed  Left [] elevated Right [] depressed  Right      [] protracted Left [] retracted Left [] protracted Right [] retracted Right [] subluxed  Left              [] subluxed  Right         Goals for Wheelchair Mobility  [x]  Independence with mobility in the home with motor related ADLs (MRADLs)  []  Independence with MRADLs in the community []  Provide dependent mobility  []  Provide recline     [] Provide tilt   Goals for Seating system [x]  Optimize pressure distribution [x]  Provide support  needed to facilitate function or safety []  Provide corrective forces to assist with maintaining or improving posture []  Accommodate client's posture: current seated postures and positions are not flexible or will not tolerate corrective forces []  Client to be independent with relieving pressure in the wheelchair [] Enhance physiological function such as breathing, swallowing, digestion  Simulation ideas/Equipment trials:                                                                                                State why other equipment was unsuccessful:                                                                           MOBILITY BASE RECOMMENDATIONS and JUSTIFICATION: MOBILITY COMPONENT JUSTIFICATION  Manufacturer:  Engineer, agricultural: [employer]              Size: Width  20"         Seat Depth   18"          [x] provide transport from point A to B [x] promote Indep mobility  [x] is not a safe, functional ambulator [x] walker or cane inadequate [] non-standard width/depth necessary to accommodate anatomical measurement []                             []   Manual Mobility Base [] non-functional ambulator    [] Scooter/POV  [] can safely operate  [] can safely transfer   [] has adequate trunk stability  [] cannot functionally propel manual w/c  [x] Power Mobility Base  [] non-ambulatory  [x] cannot functionally propel manual wheelchair  [x]  cannot functionally and safely operate scooter/POV [x] can safely operate and willing to  [] Stroller Base [] infant/child  [] unable to propel manual wheelchair [] allows for growth [] non-functional ambulator [] non-functional UE [] Indep mobility is not a goal at this time  [] Tilt  [] Forward                   [] Backward                  [] Powered tilt              [] Manual tilt  [] change position against gravitational force on head and shoulders  [] change position for pressure relief/cannot weight shift [] transfers  [] management of tone [] rest  periods [] control edema [] facilitate postural control  []                                       [] Recline  [] Power recline on power base [] Manual recline on manual base  [] accommodate femur to back angle  [] bring to full recline for ADL care  [] change position for pressure relief/cannot weight shift [] rest periods [] repositioning for transfers or clothing/diaper /catheter changes [] head positioning  [] Lighter weight required [] self- propulsion  [] lifting []                                                 [] Heavy Duty required [] user weight greater than 250# [] extreme tone/ over active movement [] broken frame on previous chair []                                     [x]  Back  []  Angle Adjustable []  Custom molded       Captain's seat                    [] postural control [] control of tone/spasticity [] accommodation of range of motion [] UE functional control [x] accommodation for seating system []                                          [] provide lateral trunk support [] accommodate deformity [x] provide posterior trunk support [x] provide lumbar/sacral support [x] support trunk in midline [x] Pressure relief over spinal processes  [x]  Seat Cushion   Captain's seat                    [] impaired sensation  [] decubitus ulcers present [] history of pressure ulceration [] prevent pelvic extension [x] low maintenance  [] stabilize pelvis  [] accommodate obliquity [] accommodate multiple deformity [x] neutralize lower extremity position [x] increase pressure distribution []                                           []  Pelvic/thigh support  []  Lateral thigh guide []  Distal medial pad  []  Distal lateral pad []  pelvis in neutral []   accommodate pelvis []  position upper legs []  alignment []  accommodate ROM []  decrease adduction [] accommodate tone [] removable for transfers [] decrease abduction  []  Lateral trunk Supports []  Lt     []  Rt [] decrease lateral trunk leaning [] control tone [] contour  for increased contact [] safety  [] accommodate asymmetry []                                                 []  Mounting hardware  [] lateral trunk supports  [] back   [] seat [] headrest      []  thigh support [] fixed   [] swing away [] attach seat platform/cushion to w/c frame [] attach back cushion to w/c frame [] mount postural supports [] mount headrest  [] swing medial thigh support away [] swing lateral supports away for transfers  []                                                     Armrests  [] fixed [x] adjustable height [] removable   [] swing away  [x] flip back   [] reclining [x] full length pads [] desk    [] pads tubular  [x] provide support with elbow at 90   [] provide support for w/c tray [x] change of height/angles for variable activities [] remove for transfers [x] allow to come closer to table top [] remove for access to tables []                                               Hangers/ Leg rests  [] 60 [] 70 [] 90 [] elevating [] heavy duty  [] articulating [] fixed [] lift off [] swing away     [] power [] provide LE support  [] accommodate to hamstring tightness [] elevate legs during recline   [] provide change in position for Legs [] Maintain placement of feet on footplate [] durability [] enable transfers [] decrease edema [] Accommodate lower leg length []                                         Foot support Footplate    [] Lt  []  Rt  [x]  Center mount [x] flip up                            [x] depth/angle adjustable [] Amputee adapter    []  Lt     []  Rt [x] provide foot support [x] accommodate to ankle ROM [x] transfers [] Provide support for residual extremity []  allow foot to go under wheelchair base []  decrease tone  []                                                 []  Ankle strap/heel loops [] support foot on foot support [] decrease extraneous movement [] provide input to heel  [] protect foot  Tires: [] pneumatic  [x] flat free inserts  [] solid  [x] decrease maintenance  [x] prevent frequent  flats [] increase shock absorbency [] decrease pain from road shock [] decrease spasms from road shock []                                              []   Headrest  [] provide posterior head support [] provide posterior neck support [] provide lateral head support [] provide anterior head support [] support during tilt and recline [] improve feeding   [] improve respiration [] placement of switches [] safety  [] accommodate ROM  [] accommodate tone [] improve visual orientation  []  Anterior chest strap []  Vest []  Shoulder retractors  [] decrease forward movement of shoulder [] accommodation of TLSO [] decrease forward movement of trunk [] decrease shoulder elevation [] added abdominal support [] alignment [] assistance with shoulder control  []                                               Pelvic Positioner [x] Belt [] SubASIS bar [] Dual Pull [] stabilize tone [x] decrease falling out of chair/ **will not Decrease potential for sliding due to pelvic tilting [] prevent excessive rotation [] pad for protection over boney prominence [] prominence comfort [] special pull angle to control rotation []                                                  Upper ExtremitySupport  [] L   []  R [] Arm trough   [] hand support []  tray       [] full tray [] swivel mount [] decrease edema      [] decrease subluxation   [] control tone   [] placement for AAC/Computer/EADL [] decrease gravitational pull on shoulders [] provide midline positioning [] provide support to increase UE function [] provide hand support in natural position [] provide work surface   POWER WHEELCHAIR CONTROLS  [x] Proportional  [] Non-Proportional Type                                      [] Left  [x] Right [x] provides access for controlling wheelchair   [] lacks motor control to operate proportional drive control [] unable to understand proportional controls  Actuator Control Module  [] Single  [] Multiple   [] Allow the client to operate the power seat  function(s) through the joystick control   [] Safety Reset Switches [] Used to change modes and stop the wheelchair when driving in latch mode    [] Upgraded Electronics   [] programming for accurate control [] progressive Disease/changing condition [] non-proportional drive control needed [] Needed in order to operate power seat functions through joystick control   [] Display box [] Allows user to see in which mode and drive the wheelchair is set  [] necessary for alternate controls    [] Digital interface electronics [] Allows w/c to operate when using alternative drive controls  [] ASL Head Array [] Allows client to operate wheelchair  through switches placed in tri-panel headrest  [] Sip and puff with tubing kit [] needed to operate sip and puff drive controls  [] Upgraded tracking electronics [] increase safety when driving [] correct tracking when on uneven surfaces  [x] Mount for switches or joystick [] Attaches switches to w/c  [x] Swing away for access or transfers [] midline for optimal placement [] provides for consistent access  [] Attendant controlled joystick plus mount [] safety [] long distance driving [] operation of seat functions [] compliance with transportation regulations []                                             Rear wheel placement/Axle adjustability [] None [] semi adjustable [] fully adjustable  []   improved UE access to wheels [] improved stability [] changing angle in space for improvement of postural stability [] 1-arm drive access [] amputee pad placement []                                Wheel rims/ hand rims  [] metal   [] plastic coated [] oblique projections           [] vertical projections [] Provide ability to propel manual wheelchair  []  Increase self-propulsion with hand weakness/decreased grasp  Push handles [] extended   [] angle adjustable              [] standard [] caregiver access [] caregiver assist [] allows "hooking" to enable increased ability to perform ADLs or maintain  balance  One armed device   [] Lt   [] Rt [] enable propulsion of manual wheelchair with one arm   []                                            Brake/wheel lock extension []  Lt   []  Rt [] increase indep in applying wheel locks   [] Side guards [] prevent clothing getting caught in wheel or becoming soiled []  prevent skin tears/abrasions  Battery:  pair                                           [x] to power wheelchair                                                         Other:                                                                                                                        The above equipment has a life- long use expectancy. Growth and changes in medical and/or functional conditions would be the exceptions. This is to certify that the therapist has no financial relationship with durable medical provider or manufacturer. The therapist will not receive remuneration of any kind for the equipment recommended in this evaluation.   Patient has mobility limitation that significantly impairs safe, timely participation in one or more mobility related ADL's. (bathing, toileting, feeding, dressing, grooming, moving from room to room)  [x]  Yes []  No  Will mobility device sufficiently improve ability to participate and/or be aided in participation of MRADL's?      [x]  Yes []  No  Can limitation be compensated for with use of a cane or walker?                                    []   Yes [x]  No  Does patient or caregiver demonstrate ability/potential ability & willingness to safely use the mobility device?    [x]  Yes []  No  Does patient's home environment support use of recommended mobility device?            [x]  Yes []  No  Does patient have sufficient upper extremity function necessary to functionally propel a manual wheelchair?     []  Yes [x]  No  Does patient have sufficient strength and trunk stability to safely operate a POV (scooter)?                                  []  Yes [x]   No  Does patient need additional features/benefits provided by a power wheelchair for MRADL's in the home?        [x]  Yes []  No  Does the patient demonstrate the ability to safely use a power wheelchair?                   [x]  Yes []  No     Physician's Name Printed:        Dow Adolph, FNP                                                Physician's Signature:  Date:     This is to certify that I, the above signed therapist have the following affiliations: []  This DME provider []  Manufacturer of recommended equipment []  Patient's long term care facility [x]  None of the above  Therapist Name/Signature:                                            Date:  ASSESSMENT:  CLINICAL IMPRESSION: Patient is a 58 y.o. 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  Pt is amb. With use of SPC but is at fall risk per TUG score of 19.84 secs with use of SPC; pt had 1 episode of Rt foot catching floor during this test but was able to recover independently.  Pt will benefit from a power wheelchair to increase independence and safety with mobility and ADL's in her home environment.  PLAN:  PT FREQUENCY: one time visit  PT DURATION: 1 week  PLANNED INTERVENTIONS: 97535- Self Care and initial evaluation for power wheelchair .  PLAN FOR NEXT SESSION: N/A - eval only   Kary Kos, PT 01/22/2024, 8:56 AM

## 2024-01-23 ENCOUNTER — Ambulatory Visit
Admission: RE | Admit: 2024-01-23 | Discharge: 2024-01-23 | Disposition: A | Discharge status: Home / Self Care | Source: Ambulatory Visit | Attending: Family Medicine | Admitting: Family Medicine

## 2024-01-23 DIAGNOSIS — Z1231 Encounter for screening mammogram for malignant neoplasm of breast: Secondary | ICD-10-CM | POA: Diagnosis not present

## 2024-01-24 DIAGNOSIS — E119 Type 2 diabetes mellitus without complications: Secondary | ICD-10-CM | POA: Diagnosis not present

## 2024-01-25 NOTE — Progress Notes (Signed)
   01/25/2024  Patient ID: Annette Munoz, female   DOB: [birth year], 58 y.o.   MRN: [id number]  [facility] 2024 Fails Report:  Per chart review, the patient has a history of dementia and stroke. Lisinopril was discontinued due to a persistent cough, and valsartan was stopped due to elevated serum creatinine. The patient is currently on an alternative medication for blood pressure management. The patient is also taking rosuvastatin 40 mg daily, last filled/sold on 01/08/2024 for a 30-day supply, per [facility]. Will collaborate with the provider for a 90-day supply. The patient is on Ozempic 2 mg once weekly, last filled on 01/22/2024 for a 28-day supply. The patient is being followed by the [facility] diabetes nutritionist. Per the last office visit notes, the patient has been using older Ozempic pens, although this is not cost-related. Ozempic was prescribed to replace Rybelsus due to cost concerns. Additionally, London Pepper was previously noted as a cost issue. [place] Pepper was last filled on 01/08/2024 for a 30-day supply.Will collaborate with the provider for a 90-day supply. Rosuvastatin and Jardiance are due for refill closer to the end of the month. Will follow up with PCP for 90 days supply and discuss with patient cost of medications.   Cephus Shelling, PharmD Clinical Pharmacist Cell: [phone]

## 2024-01-29 ENCOUNTER — Encounter: Payer: Self-pay | Admitting: Skilled Nursing Facility1

## 2024-01-29 ENCOUNTER — Encounter: Attending: Family | Admitting: Skilled Nursing Facility1

## 2024-01-29 DIAGNOSIS — N189 Chronic kidney disease, unspecified: Secondary | ICD-10-CM | POA: Diagnosis not present

## 2024-01-29 DIAGNOSIS — E119 Type 2 diabetes mellitus without complications: Secondary | ICD-10-CM | POA: Insufficient documentation

## 2024-01-29 NOTE — Progress Notes (Signed)
 Pts daughter brings her in today and states they are currently working with a nutritionist. Pts daughter states she wants to know how the patient should be eating with all of her chronic diseases. Pts daughter states the pt has a memory loss due to previous strokes. Pts daughter states her father is well but pretty stubborn with his eating and exercise stating he has DM as well. Pt is present physically but due to stroke struggles with attention but does make appropriately relevant statements about the education.    Other: Stroke Arthritis Anemia HTN  DM medications: was taking insulin but kept having lows but ddo insulin here and three Ozempic  Farxiga  CGM GMI 7.2 30 days, 91% usage spikes to 350, pt does not have fullness cues most likely due to stroke feeling hunger constantly   Daughter moved in with the patient and her husband helping them but at work during the day.  Dietitian created very specific reminders for the pt and her husband to see in the kitchen as reminders to avoid over snacking and go be active which daughters feel will be very helpful.   Diabetes Self-Management Education  Visit Type: First/Initial  Appt. Start Time: 7:52  Appt. End Time: 9:30  01/29/2024  Annette Munoz, identified by name and date of birth, is a 58 y.o. female with a diagnosis of Diabetes: Type 2.   ASSESSMENT  Height 5\' 2"  (1.575 m), weight 219 lb (99.3 kg). Body mass index is 40.06 kg/m.   Diabetes Self-Management Education - 01/29/24 0806       Visit Information   Visit Type First/Initial      Initial Visit   Diabetes Type Type 2    Are you currently following a meal plan? No    Are you taking your medications as prescribed? Yes      Health Coping   How would you rate your overall health? Good      Psychosocial Assessment   Patient Belief/Attitude about Diabetes Motivated to manage diabetes    What is the hardest part about your diabetes right now, causing you the most  concern, or is the most worrisome to you about your diabetes?   Making healty food and beverage choices    Self-management support Family    Other persons present Family Member    Patient Concerns Nutrition/Meal planning;Glycemic Control    Special Needs None    Preferred Learning Style Auditory;Visual    Learning Readiness Contemplating    How often do you need to have someone help you when you read instructions, pamphlets, or other written materials from your doctor or pharmacy? 1 - Never      Pre-Education Assessment   Patient understands the diabetes disease and treatment process. Needs Instruction    Patient understands incorporating nutritional management into lifestyle. Needs Instruction    Patient undertands incorporating physical activity into lifestyle. Needs Instruction    Patient understands using medications safely. Needs Instruction    Patient understands monitoring blood glucose, interpreting and using results Needs Instruction    Patient understands prevention, detection, and treatment of acute complications. Needs Instruction    Patient understands prevention, detection, and treatment of chronic complications. Needs Instruction    Patient understands how to develop strategies to address psychosocial issues. Needs Instruction    Patient understands how to develop strategies to promote health/change behavior. Needs Instruction      Complications   Last HgB A1C per patient/outside source 7.4 %    How  often do you check your blood sugar? 3-4 times/day    Fasting Blood glucose range (mg/dL) 366-440    Postprandial Blood glucose range (mg/dL) 347-425;>956    Number of hypoglycemic episodes per month 0    Number of hyperglycemic episodes ( >200mg /dL): Weekly    Can you tell when your blood sugar is high? No    Have you had a dilated eye exam in the past 12 months? Yes    Have you had a dental exam in the past 12 months? Yes    Are you checking your feet? No      Activity /  Exercise   Activity / Exercise Type ADL's    How many days per week do you exercise? 0    How many minutes per day do you exercise? 0    Total minutes per week of exercise 0      Patient Education   Previous Diabetes Education No    Disease Pathophysiology Definition of diabetes, type 1 and 2, and the diagnosis of diabetes    Healthy Eating Role of diet in the treatment of diabetes and the relationship between the three main macronutrients and blood glucose level;Food label reading, portion sizes and measuring food.;Plate Method;Meal timing in regards to the patients' current diabetes medication.;Information on hints to eating out and maintain blood glucose control.;Meal options for control of blood glucose level and chronic complications.    Being Active Role of exercise on diabetes management, blood pressure control and cardiac health.;Identified with patient nutritional and/or medication changes necessary with exercise.    Medications Reviewed patients medication for diabetes, action, purpose, timing of dose and side effects.    Monitoring Taught/evaluated CGM (comment);Interpreting lab values - A1C, lipid, urine microalbumina.;Daily foot exams;Identified appropriate SMBG and/or A1C goals.    Acute complications Taught prevention, symptoms, and  treatment of hypoglycemia - the 15 rule.;Discussed and identified patients' prevention, symptoms, and treatment of hyperglycemia.    Chronic complications Dental care;Retinopathy and reason for yearly dilated eye exams;Nephropathy, what it is, prevention of, the use of ACE, ARB's and early detection of through urine microalbumia.;Lipid levels, blood glucose control and heart disease;Assessed and discussed foot care and prevention of foot problems;Relationship between chronic complications and blood glucose control      Individualized Goals (developed by patient)   Nutrition Follow meal plan discussed;General guidelines for healthy choices and portions  discussed    Physical Activity Exercise 5-7 days per week;15 minutes per day;30 minutes per day    Medications take my medication as prescribed    Monitoring  Consistenly use CGM    Problem Solving Eating Pattern    Reducing Risk do foot checks daily;treat hypoglycemia with 15 grams of carbs if blood glucose less than 70mg /dL      Post-Education Assessment   Patient understands the diabetes disease and treatment process. Demonstrates understanding / competency    Patient understands incorporating nutritional management into lifestyle. Demonstrates understanding / competency    Patient undertands incorporating physical activity into lifestyle. Demonstrates understanding / competency    Patient understands using medications safely. Demonstrates understanding / competency    Patient understands monitoring blood glucose, interpreting and using results Demonstrates understanding / competency    Patient understands prevention, detection, and treatment of acute complications. Demonstrates understanding / competency    Patient understands prevention, detection, and treatment of chronic complications. Demonstrates understanding / competency    Patient understands how to develop strategies to address psychosocial issues. Demonstrates understanding / competency  Patient understands how to develop strategies to promote health/change behavior. Demonstrates understanding / competency      Outcomes   Expected Outcomes Demonstrated interest in learning but significant barriers to change    Future DMSE PRN    Program Status Completed             Individualized Plan for Diabetes Self-Management Training:   Learning Objective:  Patient will have a greater understanding of diabetes self-management. Patient education plan is to attend individual and/or group sessions per assessed needs and concerns.    Expected Outcomes:  Demonstrated interest in learning but significant barriers to  change  Education material provided: ADA - How to Thrive: A Guide for Your Journey with Diabetes, Food label handouts, Meal plan card, My Plate, Snack sheet, No sodium seasonings, and Diabetes Resources  If problems or questions, patient to contact team via:  Phone and Email  Future DSME appointment: PRN

## 2024-01-30 DIAGNOSIS — I10 Essential (primary) hypertension: Secondary | ICD-10-CM | POA: Diagnosis not present

## 2024-01-30 DIAGNOSIS — G4733 Obstructive sleep apnea (adult) (pediatric): Secondary | ICD-10-CM | POA: Diagnosis not present

## 2024-01-30 DIAGNOSIS — E1165 Type 2 diabetes mellitus with hyperglycemia: Secondary | ICD-10-CM | POA: Diagnosis not present

## 2024-01-30 DIAGNOSIS — R109 Unspecified abdominal pain: Secondary | ICD-10-CM | POA: Diagnosis not present

## 2024-02-08 ENCOUNTER — Telehealth: Payer: Self-pay

## 2024-02-08 DIAGNOSIS — Z79899 Other long term (current) drug therapy: Secondary | ICD-10-CM

## 2024-02-08 DIAGNOSIS — Z5986 Financial insecurity: Secondary | ICD-10-CM

## 2024-02-08 NOTE — Progress Notes (Signed)
   02/08/2024  Patient ID: Annette Munoz, female   DOB: June 05, 1966, 58 y.o.   MRN: [id number]  Patient was on medication adherence report and it was noted, upon chart review, affordability concerns for GLP1. I called patient today to evaluate for medication assistance, but was unable to reach. Left a HIPAA compliant voicemail asking patient to return my call.     Alexandria Angel, PharmD Clinical Pharmacist Cell: [phone]

## 2024-02-12 DIAGNOSIS — I129 Hypertensive chronic kidney disease with stage 1 through stage 4 chronic kidney disease, or unspecified chronic kidney disease: Secondary | ICD-10-CM | POA: Diagnosis not present

## 2024-02-12 DIAGNOSIS — I10 Essential (primary) hypertension: Secondary | ICD-10-CM | POA: Diagnosis not present

## 2024-02-12 DIAGNOSIS — E1122 Type 2 diabetes mellitus with diabetic chronic kidney disease: Secondary | ICD-10-CM | POA: Diagnosis not present

## 2024-02-12 DIAGNOSIS — E1165 Type 2 diabetes mellitus with hyperglycemia: Secondary | ICD-10-CM | POA: Diagnosis not present

## 2024-02-12 DIAGNOSIS — R001 Bradycardia, unspecified: Secondary | ICD-10-CM | POA: Diagnosis not present

## 2024-02-12 DIAGNOSIS — N189 Chronic kidney disease, unspecified: Secondary | ICD-10-CM | POA: Diagnosis not present

## 2024-02-12 DIAGNOSIS — G4733 Obstructive sleep apnea (adult) (pediatric): Secondary | ICD-10-CM | POA: Diagnosis not present

## 2024-02-12 DIAGNOSIS — F0390 Unspecified dementia without behavioral disturbance: Secondary | ICD-10-CM | POA: Diagnosis not present

## 2024-02-14 DIAGNOSIS — N1832 Chronic kidney disease, stage 3b: Secondary | ICD-10-CM | POA: Diagnosis not present

## 2024-02-14 DIAGNOSIS — E785 Hyperlipidemia, unspecified: Secondary | ICD-10-CM | POA: Diagnosis not present

## 2024-02-14 DIAGNOSIS — E1122 Type 2 diabetes mellitus with diabetic chronic kidney disease: Secondary | ICD-10-CM | POA: Diagnosis not present

## 2024-02-14 DIAGNOSIS — E669 Obesity, unspecified: Secondary | ICD-10-CM | POA: Diagnosis not present

## 2024-02-14 DIAGNOSIS — I639 Cerebral infarction, unspecified: Secondary | ICD-10-CM | POA: Diagnosis not present

## 2024-02-14 DIAGNOSIS — I129 Hypertensive chronic kidney disease with stage 1 through stage 4 chronic kidney disease, or unspecified chronic kidney disease: Secondary | ICD-10-CM | POA: Diagnosis not present

## 2024-02-18 DIAGNOSIS — R413 Other amnesia: Secondary | ICD-10-CM | POA: Diagnosis not present

## 2024-02-18 DIAGNOSIS — I10 Essential (primary) hypertension: Secondary | ICD-10-CM | POA: Diagnosis not present

## 2024-02-18 DIAGNOSIS — G4733 Obstructive sleep apnea (adult) (pediatric): Secondary | ICD-10-CM | POA: Diagnosis not present

## 2024-02-21 ENCOUNTER — Telehealth: Payer: Self-pay

## 2024-02-21 DIAGNOSIS — E119 Type 2 diabetes mellitus without complications: Secondary | ICD-10-CM | POA: Diagnosis not present

## 2024-02-21 DIAGNOSIS — R413 Other amnesia: Secondary | ICD-10-CM

## 2024-02-25 ENCOUNTER — Telehealth: Payer: Self-pay

## 2024-02-25 NOTE — Progress Notes (Unsigned)
 Complex Care Management Note Care Guide Note  02/25/2024 Name: Annette Munoz MRN: [id number] DOB: November 29, 1965   Complex Care Management Outreach Attempts: An unsuccessful telephone outreach was attempted today to offer the patient information about available complex care management services.  Follow Up Plan:  Additional outreach attempts will be made to offer the patient complex care management information and services.   Encounter Outcome:  No Answer  Lenton Rail , RMA     [place]  [facility], [facility] Guide  Direct Dial: [phone]  Website: [email or web address]

## 2024-02-26 NOTE — Progress Notes (Signed)
 Complex Care Management Note  Care Guide Note 02/26/2024 Name: ENEDELIA VARELA MRN: [id number] DOB: [birth year]  Erlinda Haws is a 58 y.o. year old female who sees Aleta Anda, MD for primary care. I reached out to Erlinda Haws by phone today to offer complex care management services.  Ms. Triola was given information about Complex Care Management services today including:   The Complex Care Management services include support from the care team which includes your Nurse Care Manager, Clinical Social Worker, or Pharmacist.  The Complex Care Management team is here to help remove barriers to the health concerns and goals most important to you. Complex Care Management services are voluntary, and the patient may decline or stop services at any time by request to their care team member.   Complex Care Management Consent Status: Patient agreed to services and verbal consent obtained.   Follow up plan:  Telephone appointment with complex care management team member scheduled for:  03/05/2024  Encounter Outcome:  Patient Scheduled  Lenton Rail , RMA     [employer]  [facility], [facility] Guide  Direct Dial: [phone]  Website: [email or web address]

## 2024-02-28 ENCOUNTER — Telehealth: Payer: Self-pay

## 2024-02-28 DIAGNOSIS — Z9189 Other specified personal risk factors, not elsewhere classified: Secondary | ICD-10-CM

## 2024-02-28 NOTE — Progress Notes (Signed)
   02/28/2024  Patient ID: Annette Munoz, female   DOB: 1966-01-10, 58 y.o.   MRN: [id number]  Medication Adherence Report:   I called and spoke with patient's daughter, Rosalina Colt regarding medication adherence as Ms. Kitamura has dementia. HIPAA identifiers confirmed.  Ms. Rosalina Colt stated that she was okay with her mother's medications being switched to 90-days supply if the cost would not increase. She also denies that her mother had issues with cost of Ozempic due copay of $0 and most medications are shipped from [employer]. Given the patient has dementia diagnosis it can be difficult at times for medication adherence.   Message sent to Dr. Avon Boers regarding switching rosuvastatin and Jardiance to 90 days supplies.   Alexandria Angel, PharmD Clinical Pharmacist Cell: [phone]

## 2024-02-29 DIAGNOSIS — G4733 Obstructive sleep apnea (adult) (pediatric): Secondary | ICD-10-CM | POA: Diagnosis not present

## 2024-03-03 DIAGNOSIS — Z7409 Other reduced mobility: Secondary | ICD-10-CM | POA: Diagnosis not present

## 2024-03-03 DIAGNOSIS — E1121 Type 2 diabetes mellitus with diabetic nephropathy: Secondary | ICD-10-CM | POA: Diagnosis not present

## 2024-03-05 ENCOUNTER — Other Ambulatory Visit: Payer: Self-pay | Admitting: Licensed Clinical Social Worker

## 2024-03-05 NOTE — Patient Outreach (Addendum)
 Complex Care Management   Visit Note  03/05/2024  Name:  Annette Munoz MRN: [id number] DOB: 1966/06/09  Situation: Referral received for Complex Care Management related to Stroke I obtained verbal consent from Caregiver.  Visit completed with K Wardell   on the phone. LCSW A Dashley Monts contacted Dr Avon Boers left message on nurse triage line requesting a prescription be sent to [facility] pharmacy for blood pressure cuff. According to pt insurance one blood pressure cuff is coverage one per calendar year. Pt daughter encourage to contact [facility] for shower chair and incontinence supplies. Pt daughter was given contact information via phone.   Background:   Past Medical History:  Diagnosis Date   Anemia    Arthritis    Diabetes mellitus without complication (HCC)    Hypertension    Stroke [facility])     Assessment: Patient Reported Symptoms:  Cognitive Cognitive Status: Unable to Assess Cognitive/Intellectual Conditions Management [RPT]: Not Assessed      Neurological   Neurological Conditions: Stroke, hemorrhagic  HEENT HEENT Symptoms Reported: Not assessed      Cardiovascular   Cardiovascular Conditions: Hypertension  Respiratory Respiratory Symptoms Reported: No symptoms reported    Endocrine Patient reports the following symptoms related to hypoglycemia or hyperglycemia : No symptoms reported    Gastrointestinal Gastrointestinal Symptoms Reported: No symptoms reported      Genitourinary Genitourinary Symptoms Reported: Incontinence    Integumentary Integumentary Symptoms Reported: No symptoms reported    Musculoskeletal Musculoskelatal Symptoms Reviewed: No symptoms reported        Psychosocial       Do you feel physically threatened by others?: No      02/09/2020    4:03 PM  Depression screen PHQ 2/9  Decreased Interest 1  Down, Depressed, Hopeless 1  PHQ - 2 Score 2  Altered sleeping 2  Tired, decreased energy 3  Change in appetite 1  Feeling bad or failure  about yourself  0  Trouble concentrating 1  Moving slowly or fidgety/restless 1  Suicidal thoughts 0  PHQ-9 Score 10  Difficult doing work/chores Not difficult at all    There were no vitals filed for this visit.  Medications Reviewed Today     Reviewed by Fletcher Humble, LCSW (Social Worker) on 03/05/24 at 1716  Med List Status: <None>   Medication Order Taking? Sig Documenting Provider Last Dose Status Informant  Alcohol  Swabs (ALCOHOL  PREP) PADS [id number]  Use to check blood sugar daily. E11.65 Glen Land, PA-C  Active Child  aspirin  EC 81 MG tablet [id number]  Take 81 mg by mouth daily. Swallow whole. [provider]  Active Child  atorvastatin  (LIPITOR ) 80 MG tablet [id number]  TAKE 1 TABLET (80 MG TOTAL) BY MOUTH DAILY AT 6 PM.  Patient taking differently: Take 40 mg by mouth at bedtime.   Consuelo Denmark, MD  Active Child  blood glucose meter kit and supplies KIT [id number]  Use daily to check blood sugar. E11.65 Weber, Clance Crigler, PA-C  Active Child  Blood Pressure Monitoring (PREMIUM AUTOMATIC BP MONITOR) DEVI [id number]  1 Device by Does not apply route daily. Weber, Clance Crigler, PA-C  Active Child  Cholecalciferol (D3 5000 PO) [id number]  Take by mouth. [provider]  Active Child  citalopram  (CELEXA ) 20 MG tablet [id number]   [provider]  Active Child  dapagliflozin propanediol (FARXIGA) 10 MG TABS tablet [id number]  Take 10 mg by mouth daily. [provider]  Active Child  donepezil  (ARICEPT ) 10 MG  tablet [id number]  Take 1 tablet (10 mg total) by mouth at bedtime. Consuelo Denmark, MD  Active Child  folic acid  (FOLVITE ) 1 MG tablet [id number]  TAKE 1 TABLET BY MOUTH EVERY MORNING *PATIENT NEEDS APPOINTMENTNiels Barry, Jessica, NP  Active Child  glucose blood test strip [id number]  Use to check blood sugar daily. E11.65 Glen Land, PA-C  Active Child  Lancet Devices (LANCING DEVICE) MISC [id number]  Use daily to check blood sugar. E11.65 Glen Land,  PA-C  Active Child  Lancets MISC [id number]  Use to check blood sugar daily. E11.65 Weber, Clance Crigler, PA-C  Active Child  Multiple Vitamin (MULTIVITAMIN) tablet [id number]  Take 1 tablet by mouth daily. [provider]  Active Child  norethindrone  (AYGESTIN ) 5 MG tablet [id number]  Take 2 tablets (10 mg total) by mouth daily. Danford, Willis Harter, MD  Active   Semaglutide (OZEMPIC, 0.25 OR 0.5 MG/DOSE, [place]) [id number]  Inject 0.5 mg into the skin once a week. Thursdays [provider]  Active Child            Recommendation:   DME requests:  shower chair other blood pressure cuff   Follow Up Plan:   Telephone follow up appointment date/time:  03/13/24 11am    Fletcher Humble MSW, LCSW Licensed Clinical Social Worker  [facility], Population Health Direct Dial: [phone]  Fax: [phone]

## 2024-03-05 NOTE — Patient Instructions (Signed)
 Visit Information  Thank you for taking time to visit with me today. Please don't hesitate to contact me if I can be of assistance to you before our next scheduled appointment.  Your next care management appointment is by telephone on 03/13/2024 at 11am  Telephone follow up appointment date/time:  03/13/24 11am  Please call the care guide team at [phone] if you need to cancel, schedule, or reschedule an appointment.   Please call 911 if you are experiencing a Mental Health or Behavioral Health Crisis or need someone to talk to.  Fletcher Humble MSW, LCSW Licensed Clinical Social Worker  [facility], Population Health Direct Dial: [phone]  Fax: [phone]

## 2024-03-11 DIAGNOSIS — E785 Hyperlipidemia, unspecified: Secondary | ICD-10-CM | POA: Diagnosis not present

## 2024-03-11 DIAGNOSIS — R5383 Other fatigue: Secondary | ICD-10-CM | POA: Diagnosis not present

## 2024-03-11 DIAGNOSIS — D649 Anemia, unspecified: Secondary | ICD-10-CM | POA: Diagnosis not present

## 2024-03-11 DIAGNOSIS — E559 Vitamin D deficiency, unspecified: Secondary | ICD-10-CM | POA: Diagnosis not present

## 2024-03-11 DIAGNOSIS — R739 Hyperglycemia, unspecified: Secondary | ICD-10-CM | POA: Diagnosis not present

## 2024-03-11 DIAGNOSIS — E1165 Type 2 diabetes mellitus with hyperglycemia: Secondary | ICD-10-CM | POA: Diagnosis not present

## 2024-03-13 ENCOUNTER — Other Ambulatory Visit: Payer: Self-pay | Admitting: Licensed Clinical Social Worker

## 2024-03-14 NOTE — Patient Instructions (Addendum)
 Visit Information  Thank you for taking time to visit with me today. Please don't hesitate to contact me if I can be of assistance to you before our next scheduled appointment.  Your next care management appointment is by telephone on 69/2025 at 2pm  Telephone follow up appointment date/time:  03/24/2024 2pm  Please call the care guide team at [phone] if you need to cancel, schedule, or reschedule an appointment.   Please call 911 if you are experiencing a Mental Health or Behavioral Health Crisis or need someone to talk to.  Fletcher Humble MSW, LCSW Licensed Clinical Social Worker  [facility], Population Health Direct Dial: [phone]  Fax: [phone]

## 2024-03-14 NOTE — Patient Outreach (Signed)
 Complex Care Management   Visit Note  03/14/2024  Name:  Annette Munoz MRN: [id number] DOB: 12/09/1965  Situation: Referral received for Complex Care Management related to Stroke and Diabetes with Complications I obtained verbal consent from Caregiver.  Visit completed with Lorence Rolls pt daughter   on the phone. LCSW A Sharry Deem contacted [employer], Dr. Avon Boers office and [facility] on pt's behalf. According to [employer] bp cuff is covered however a prescription is required, LCSW A Ronalee Scheunemann called Dr. Avon Boers office twice and relayed this information. Mychart message sent to member advising of additional options to receive a blood pressure cuff. Pt daughter encouraged to called [facility], information provided to inquire about availability of shower chair, incontinence supplies and bp cuff. LCSW A Genae Strine provided food pantries resources to supplement food supply at home.  Background:   Past Medical History:  Diagnosis Date   Anemia    Arthritis    Diabetes mellitus without complication (HCC)    Hypertension    Stroke [facility])     Assessment: Patient Reported Symptoms:  Cognitive Cognitive Status: Unable to Assess Cognitive/Intellectual Conditions Management [RPT]: Not Assessed      Neurological   Neurological Conditions: Stroke, hemorrhagic Neurological Management Strategies: Medical device (blood pressure cuff)  HEENT HEENT Symptoms Reported: Not assessed      Cardiovascular Cardiovascular Symptoms Reported: No symptoms reported Cardiovascular Conditions: Hypertension  Respiratory Respiratory Symptoms Reported: No symptoms reported    Endocrine Patient reports the following symptoms related to hypoglycemia or hyperglycemia : No symptoms reported Is patient diabetic?: Yes Is patient checking blood sugars at home?: Yes Endocrine Management Strategies: Adequate rest  Gastrointestinal Gastrointestinal Symptoms Reported: No symptoms reported      Genitourinary  Genitourinary Symptoms Reported: Incontinence    Integumentary Integumentary Symptoms Reported: No symptoms reported    Musculoskeletal Musculoskelatal Symptoms Reviewed: No symptoms reported        Psychosocial       Quality of Family Relationships: supportive, helpful, involved Do you feel physically threatened by others?: No      02/09/2020    4:03 PM  Depression screen PHQ 2/9  Decreased Interest 1  Down, Depressed, Hopeless 1  PHQ - 2 Score 2  Altered sleeping 2  Tired, decreased energy 3  Change in appetite 1  Feeling bad or failure about yourself  0  Trouble concentrating 1  Moving slowly or fidgety/restless 1  Suicidal thoughts 0  PHQ-9 Score 10  Difficult doing work/chores Not difficult at all    There were no vitals filed for this visit.  Medications Reviewed Today     Reviewed by Fletcher Humble, LCSW (Social Worker) on 03/14/24 at 1208  Med List Status: <None>   Medication Order Taking? Sig Documenting Provider Last Dose Status Informant  Alcohol  Swabs (ALCOHOL  PREP) PADS [id number] No Use to check blood sugar daily. E11.65 Glen Land, PA-C Taking Active Child  aspirin  EC 81 MG tablet [id number] No Take 81 mg by mouth daily. Swallow whole. [provider] Taking Active Child  atorvastatin  (LIPITOR ) 80 MG tablet [id number] No TAKE 1 TABLET (80 MG TOTAL) BY MOUTH DAILY AT 6 PM.  Patient taking differently: Take 40 mg by mouth at bedtime.   Consuelo Denmark, MD 06/21/2023 Active Child  blood glucose meter kit and supplies KIT [id number] No Use daily to check blood sugar. E11.65 Glen [place], PA-C Taking Active Child  Blood Pressure Monitoring (PREMIUM AUTOMATIC BP MONITOR) DEVI [id number] No 1 Device by  Does not apply route daily. Weber, Clance Crigler, PA-C Taking Active Child  Cholecalciferol (D3 5000 PO) [id number] No Take by mouth. [provider] Taking Active Child  citalopram  (CELEXA ) 20 MG tablet [id number] No  [provider] Taking Active  Child  dapagliflozin propanediol (FARXIGA) 10 MG TABS tablet [id number] No Take 10 mg by mouth daily. [provider] Taking Active Child  donepezil  (ARICEPT ) 10 MG tablet [id number] No Take 1 tablet (10 mg total) by mouth at bedtime. Consuelo Denmark, MD Taking Active Child  folic acid  (FOLVITE ) 1 MG tablet [id number] No TAKE 1 TABLET BY MOUTH EVERY MORNING *PATIENT NEEDS APPOINTMENTNiels Barry, Jessica, NP Taking Active Child  glucose blood test strip [id number] No Use to check blood sugar daily. E11.65 [place], PA-C Taking Active Child  Lancet Devices (LANCING DEVICE) MISC [id number] No Use daily to check blood sugar. E11.65 Glen Land, PA-C Taking Active Child  Lancets MISC [id number] No Use to check blood sugar daily. E11.65 [place], PA-C Taking Active Child  Multiple Vitamin (MULTIVITAMIN) tablet [id number] No Take 1 tablet by mouth daily. [provider] Taking Active Child  norethindrone  (AYGESTIN ) 5 MG tablet [id number] No Take 2 tablets (10 mg total) by mouth daily. Danford, Willis Harter, MD Taking Active   Semaglutide (OZEMPIC, 0.25 OR 0.5 MG/DOSE, [place]) [id number] No Inject 0.5 mg into the skin once a week. Thursdays [provider] Taking Active Child            Recommendation:   DME requests:  shower chair other blood pressure cuff Check pt mychart message for information. Contact [facility] Follow Up Plan:   Telephone follow up appointment date/time:  6/92025

## 2024-03-16 DIAGNOSIS — Z7982 Long term (current) use of aspirin: Secondary | ICD-10-CM | POA: Diagnosis not present

## 2024-03-16 DIAGNOSIS — F325 Major depressive disorder, single episode, in full remission: Secondary | ICD-10-CM | POA: Diagnosis not present

## 2024-03-16 DIAGNOSIS — Z008 Encounter for other general examination: Secondary | ICD-10-CM | POA: Diagnosis not present

## 2024-03-16 DIAGNOSIS — I69331 Monoplegia of upper limb following cerebral infarction affecting right dominant side: Secondary | ICD-10-CM | POA: Diagnosis not present

## 2024-03-16 DIAGNOSIS — R32 Unspecified urinary incontinence: Secondary | ICD-10-CM | POA: Diagnosis not present

## 2024-03-16 DIAGNOSIS — N184 Chronic kidney disease, stage 4 (severe): Secondary | ICD-10-CM | POA: Diagnosis not present

## 2024-03-16 DIAGNOSIS — E1122 Type 2 diabetes mellitus with diabetic chronic kidney disease: Secondary | ICD-10-CM | POA: Diagnosis not present

## 2024-03-16 DIAGNOSIS — E785 Hyperlipidemia, unspecified: Secondary | ICD-10-CM | POA: Diagnosis not present

## 2024-03-16 DIAGNOSIS — M1990 Unspecified osteoarthritis, unspecified site: Secondary | ICD-10-CM | POA: Diagnosis not present

## 2024-03-16 DIAGNOSIS — Z8249 Family history of ischemic heart disease and other diseases of the circulatory system: Secondary | ICD-10-CM | POA: Diagnosis not present

## 2024-03-16 DIAGNOSIS — G309 Alzheimer's disease, unspecified: Secondary | ICD-10-CM | POA: Diagnosis not present

## 2024-03-16 DIAGNOSIS — E1142 Type 2 diabetes mellitus with diabetic polyneuropathy: Secondary | ICD-10-CM | POA: Diagnosis not present

## 2024-03-24 ENCOUNTER — Other Ambulatory Visit: Payer: Self-pay | Admitting: Licensed Clinical Social Worker

## 2024-03-24 DIAGNOSIS — E119 Type 2 diabetes mellitus without complications: Secondary | ICD-10-CM | POA: Diagnosis not present

## 2024-03-24 NOTE — Patient Instructions (Signed)
 Visit Information  Thank you for taking time to visit with me today. Please don't hesitate to contact me if I can be of assistance to you before our next scheduled appointment.  Your next care management appointment is by telephone on 04/07/2024 at 2pm  Telephone follow up appointment date/time:  04/07/2024 2pm  Please call the care guide team at [phone] if you need to cancel, schedule, or reschedule an appointment.   Please call 911 if you are experiencing a Mental Health or Behavioral Health Crisis or need someone to talk to.  Fletcher Humble MSW, LCSW Licensed Clinical Social Worker  [facility], Population Health Direct Dial: [phone]  Fax: [phone]

## 2024-03-24 NOTE — Patient Outreach (Signed)
 Complex Care Management   Visit Note  03/24/2024  Name:  Annette Munoz MRN: [id number] DOB: January 01, 1966  Situation: Referral received for Complex Care Management related to Stroke I obtained verbal consent from Caregiver.  Visit completed with Annette Munoz  on the phone. Completed follow up with pt daughter Annette Munoz. Annette Munoz reports mold in the home and still needing assistance obtaining a blood pressure cuff.   Background:   Past Medical History:  Diagnosis Date   Anemia    Arthritis    Diabetes mellitus without complication (HCC)    Hypertension    Stroke [facility])     Assessment: Patient Reported Symptoms:  Cognitive Cognitive Status: Unable to Assess Cognitive/Intellectual Conditions Management [RPT]: Not Assessed      Neurological Neurological Review of Symptoms: Not assessed Neurological Conditions: Stroke, hemorrhagic Neurological Management Strategies: Medical device  HEENT HEENT Symptoms Reported: Not assessed      Cardiovascular Does patient have uncontrolled Hypertension?: No Cardiovascular Conditions: Hypertension Cardiovascular Management Strategies: Diet modification, Medical device  Respiratory Respiratory Symptoms Reported: No symptoms reported    Endocrine Patient reports the following symptoms related to hypoglycemia or hyperglycemia : No symptoms reported Is patient diabetic?: Yes Is patient checking blood sugars at home?: Yes Endocrine Conditions: Diabetes Endocrine Management Strategies: Adequate rest, Diet modification  Gastrointestinal Gastrointestinal Symptoms Reported: No symptoms reported      Genitourinary Genitourinary Symptoms Reported: Incontinence    Integumentary Integumentary Symptoms Reported: No symptoms reported    Musculoskeletal Musculoskelatal Symptoms Reviewed: No symptoms reported   Falls in the past year?: No Number of falls in past year: 1 or less Was there an injury with Fall?: No Fall Risk Category Calculator: 0 Patient Fall  Risk Level: Low Fall Risk    Psychosocial       Quality of Family Relationships: supportive, involved, helpful Do you feel physically threatened by others?: No      02/09/2020    4:03 PM  Depression screen PHQ 2/9  Decreased Interest 1  Down, Depressed, Hopeless 1  PHQ - 2 Score 2  Altered sleeping 2  Tired, decreased energy 3  Change in appetite 1  Feeling bad or failure about yourself  0  Trouble concentrating 1  Moving slowly or fidgety/restless 1  Suicidal thoughts 0  PHQ-9 Score 10  Difficult doing work/chores Not difficult at all    There were no vitals filed for this visit.  Medications Reviewed Today     Reviewed by Fletcher Humble, LCSW (Social Worker) on 03/24/24 at 1653  Med List Status: <None>   Medication Order Taking? Sig Documenting Provider Last Dose Status Informant  Alcohol  Swabs (ALCOHOL  PREP) PADS [id number] No Use to check blood sugar daily. E11.65 [place] Land, PA-C Taking Active Child  aspirin  EC 81 MG tablet [id number] No Take 81 mg by mouth daily. Swallow whole. [provider] Taking Active Child  atorvastatin  (LIPITOR ) 80 MG tablet [id number] No TAKE 1 TABLET (80 MG TOTAL) BY MOUTH DAILY AT 6 PM.  Patient taking differently: Take 40 mg by mouth at bedtime.   Consuelo Denmark, MD 06/21/2023 Active Child  blood glucose meter kit and supplies KIT [id number] No Use daily to check blood sugar. E11.65 Weber, Clance Crigler, PA-C Taking Active Child  Blood Pressure Monitoring (PREMIUM AUTOMATIC BP MONITOR) DEVI [id number] No 1 Device by Does not apply route daily. Weber, Clance Crigler, PA-C Taking Active Child  Cholecalciferol (D3 5000 PO) [id number] No Take by mouth. [provider] Taking Active Child  citalopram  (CELEXA ) 20 MG tablet [id number] No  [provider] Taking Active Child  dapagliflozin propanediol (FARXIGA) 10 MG TABS tablet [id number] No Take 10 mg by mouth daily. [provider] Taking Active Child  donepezil  (ARICEPT ) 10 MG  tablet [id number] No Take 1 tablet (10 mg total) by mouth at bedtime. Consuelo Denmark, MD Taking Active Child  folic acid  (FOLVITE ) 1 MG tablet [id number] No TAKE 1 TABLET BY MOUTH EVERY MORNING *PATIENT NEEDS APPOINTMENTNiels Barry, Jessica, NP Taking Active Child  glucose blood test strip [id number] No Use to check blood sugar daily. E11.65 Glen Land, PA-C Taking Active Child  Lancet Devices (LANCING DEVICE) MISC [id number] No Use daily to check blood sugar. E11.65 Glen Land, PA-C Taking Active Child  Lancets MISC [id number] No Use to check blood sugar daily. E11.65 [place], PA-C Taking Active Child  Multiple Vitamin (MULTIVITAMIN) tablet [id number] No Take 1 tablet by mouth daily. [provider] Taking Active Child  norethindrone  (AYGESTIN ) 5 MG tablet [id number] No Take 2 tablets (10 mg total) by mouth daily. Danford, Willis Harter, MD Taking Active   Semaglutide (OZEMPIC, 0.25 OR 0.5 MG/DOSE, [place]) [id number] No Inject 0.5 mg into the skin once a week. Thursdays [provider] Taking Active Child            Recommendation:   Pt daughter will contact [employer] for assistance to eliminate mold in the home. [employer] nurse provided pt daughter information to obtaining a blood pressure cuff.  Follow Up Plan:   Telephone follow up appointment date/time:  04/07/2024 2pm  Fletcher Humble MSW, LCSW Licensed Clinical Social Worker  [facility], Population Health Direct Dial: [phone]  Fax: [phone]

## 2024-03-31 DIAGNOSIS — J309 Allergic rhinitis, unspecified: Secondary | ICD-10-CM | POA: Diagnosis not present

## 2024-03-31 DIAGNOSIS — R32 Unspecified urinary incontinence: Secondary | ICD-10-CM | POA: Diagnosis not present

## 2024-03-31 DIAGNOSIS — I10 Essential (primary) hypertension: Secondary | ICD-10-CM | POA: Diagnosis not present

## 2024-03-31 DIAGNOSIS — N189 Chronic kidney disease, unspecified: Secondary | ICD-10-CM | POA: Diagnosis not present

## 2024-03-31 DIAGNOSIS — E559 Vitamin D deficiency, unspecified: Secondary | ICD-10-CM | POA: Diagnosis not present

## 2024-03-31 DIAGNOSIS — G4733 Obstructive sleep apnea (adult) (pediatric): Secondary | ICD-10-CM | POA: Diagnosis not present

## 2024-03-31 DIAGNOSIS — D509 Iron deficiency anemia, unspecified: Secondary | ICD-10-CM | POA: Diagnosis not present

## 2024-04-02 ENCOUNTER — Telehealth: Payer: Self-pay

## 2024-04-02 NOTE — Progress Notes (Signed)
   04/02/2024  Patient ID: Annette Munoz, female   DOB: [birth year], 58 y.o.   MRN: [id number]  Medication adherence fails report 2024:  Ozempic 2mg  last sold on 03/11/2024  for 28 days supply, per Dr. Arleen Lacer, PharmD Clinical Pharmacist Cell: [phone]

## 2024-04-03 DIAGNOSIS — E1121 Type 2 diabetes mellitus with diabetic nephropathy: Secondary | ICD-10-CM | POA: Diagnosis not present

## 2024-04-03 DIAGNOSIS — Z7409 Other reduced mobility: Secondary | ICD-10-CM | POA: Diagnosis not present

## 2024-04-07 ENCOUNTER — Other Ambulatory Visit: Payer: Self-pay | Admitting: Licensed Clinical Social Worker

## 2024-04-07 DIAGNOSIS — I10 Essential (primary) hypertension: Secondary | ICD-10-CM | POA: Diagnosis not present

## 2024-04-07 DIAGNOSIS — R413 Other amnesia: Secondary | ICD-10-CM | POA: Diagnosis not present

## 2024-04-07 DIAGNOSIS — G4733 Obstructive sleep apnea (adult) (pediatric): Secondary | ICD-10-CM | POA: Diagnosis not present

## 2024-04-08 NOTE — Patient Instructions (Signed)

## 2024-04-08 NOTE — Patient Outreach (Signed)
 Complex Care Management   Visit Note  04/08/2024  Name:  Annette Munoz MRN: [id number] DOB: 04-06-1966  Situation: Referral received for Complex Care Management related to Stroke I obtained verbal consent from Caregiver.  Visit completed with Annette Munoz   on the phone. Pt daughter received several contact information for community support. LCSW A [employer] pt daughter affordable cost options for blood pressure cuff and mold removal   Background:   Past Medical History:  Diagnosis Date   Anemia    Arthritis    Diabetes mellitus without complication (HCC)    Hypertension    Stroke [facility])     Assessment: Patient Reported Symptoms:  Cognitive Cognitive Status: Unable to Assess Cognitive/Intellectual Conditions Management [RPT]: Not Assessed, None reported or documented in medical history or problem list      Neurological Neurological Review of Symptoms: No symptoms reported Neurological Conditions: Stroke, hemorrhagic  HEENT HEENT Symptoms Reported: No symptoms reported      Cardiovascular Cardiovascular Symptoms Reported: No symptoms reported Does patient have uncontrolled Hypertension?: No Cardiovascular Conditions: Hypertension Cardiovascular Management Strategies: Diet modification, Medical device  Respiratory Respiratory Symptoms Reported: No symptoms reported    Endocrine Patient reports the following symptoms related to hypoglycemia or hyperglycemia : No symptoms reported Is patient diabetic?: Yes Is patient checking blood sugars at home?: Yes Endocrine Conditions: Diabetes Endocrine Management Strategies: Medication therapy, Adequate rest, Diet modification  Gastrointestinal Gastrointestinal Symptoms Reported: No symptoms reported      Genitourinary Genitourinary Symptoms Reported: Incontinence Genitourinary Management Strategies: Incontinence garment/pad  Integumentary Integumentary Symptoms Reported: No symptoms reported    Musculoskeletal Musculoskelatal  Symptoms Reviewed: No symptoms reported   Falls in the past year?: No Number of falls in past year: 1 or less Was there an injury with Fall?: No Fall Risk Category Calculator: 0 Patient Fall Risk Level: Low Fall Risk    Psychosocial       Quality of Family Relationships: supportive, involved, helpful Do you feel physically threatened by others?: No      02/09/2020    4:03 PM  Depression screen PHQ 2/9  Decreased Interest 1  Down, Depressed, Hopeless 1  PHQ - 2 Score 2  Altered sleeping 2  Tired, decreased energy 3  Change in appetite 1  Feeling bad or failure about yourself  0  Trouble concentrating 1  Moving slowly or fidgety/restless 1  Suicidal thoughts 0  PHQ-9 Score 10  Difficult doing work/chores Not difficult at all    There were no vitals filed for this visit.  Medications Reviewed Today     Reviewed by Clement Odor, LCSW (Social Worker) on 04/08/24 at 1232  Med List Status: <None>   Medication Order Taking? Sig Documenting Provider Last Dose Status Informant  Alcohol  Swabs (ALCOHOL  PREP) PADS [id number]  Use to check blood sugar daily. E11.65 Allen Lauraine CROME, PA-C  Active Child  aspirin  EC 81 MG tablet [id number] Yes Take 81 mg by mouth daily. Swallow whole. [provider]  Active Child  atorvastatin  (LIPITOR ) 80 MG tablet [id number] Yes TAKE 1 TABLET (80 MG TOTAL) BY MOUTH DAILY AT 6 PM. Jerri Pfeiffer, MD  Active Child  blood glucose meter kit and supplies KIT [id number] Yes Use daily to check blood sugar. E11.65 Weber, Lauraine CROME, PA-C  Active Child  Blood Pressure Monitoring (PREMIUM AUTOMATIC BP MONITOR) DEVI [id number] Yes 1 Device by Does not apply route daily. Weber, Lauraine CROME, PA-C  Active Child  Cholecalciferol (D3 5000 PO) [id number] Yes Take by  mouth. [provider]  Active Child  citalopram  (CELEXA ) 20 MG tablet [id number] Yes  [provider]  Active Child  dapagliflozin propanediol (FARXIGA) 10 MG TABS tablet [id number] Yes Take 10  mg by mouth daily. [provider]  Active Child  donepezil  (ARICEPT ) 10 MG tablet [id number] Yes Take 1 tablet (10 mg total) by mouth at bedtime. Jerri Pfeiffer, MD  Active Child  folic acid  (FOLVITE ) 1 MG tablet [id number] Yes TAKE 1 TABLET BY MOUTH EVERY MORNING *PATIENT NEEDS APPOINTMENTDEWAINE Bogaert, Jessica, NP  Active Child  glucose blood test strip [id number]  Use to check blood sugar daily. E11.65 Allen Lauraine CROME, PA-C  Active Child  Lancet Devices (LANCING DEVICE) MISC [id number]  Use daily to check blood sugar. E11.65 Allen Lauraine CROME, PA-C  Active Child  Lancets MISC [id number]  Use to check blood sugar daily. E11.65 Weber, Lauraine CROME, PA-C  Active Child  Multiple Vitamin (MULTIVITAMIN) tablet [id number] Yes Take 1 tablet by mouth daily. [provider]  Active Child  norethindrone  (AYGESTIN ) 5 MG tablet [id number] Yes Take 2 tablets (10 mg total) by mouth daily. Danford, Lonni SQUIBB, MD  Active   Semaglutide (OZEMPIC, 0.25 OR 0.5 MG/DOSE, [place]) [id number] Yes Inject 0.5 mg into the skin once a week. Thursdays [provider]  Active Child            Recommendation:   Continue Current Plan of Care  Follow Up Plan:   Patient has met all care management goals. Care Management case will be closed. Patient has been provided contact information should new needs arise.   Alfonso Rummer MSW, LCSW Licensed Clinical Social Worker  [facility], Population Health Direct Dial: [phone]  Fax: [phone]

## 2024-04-15 DIAGNOSIS — H25042 Posterior subcapsular polar age-related cataract, left eye: Secondary | ICD-10-CM | POA: Diagnosis not present

## 2024-04-21 DIAGNOSIS — E119 Type 2 diabetes mellitus without complications: Secondary | ICD-10-CM | POA: Diagnosis not present

## 2024-04-30 DIAGNOSIS — E1169 Type 2 diabetes mellitus with other specified complication: Secondary | ICD-10-CM | POA: Diagnosis not present

## 2024-04-30 DIAGNOSIS — H269 Unspecified cataract: Secondary | ICD-10-CM | POA: Diagnosis not present

## 2024-04-30 DIAGNOSIS — E1165 Type 2 diabetes mellitus with hyperglycemia: Secondary | ICD-10-CM | POA: Diagnosis not present

## 2024-04-30 DIAGNOSIS — I129 Hypertensive chronic kidney disease with stage 1 through stage 4 chronic kidney disease, or unspecified chronic kidney disease: Secondary | ICD-10-CM | POA: Diagnosis not present

## 2024-04-30 DIAGNOSIS — I10 Essential (primary) hypertension: Secondary | ICD-10-CM | POA: Diagnosis not present

## 2024-04-30 DIAGNOSIS — E782 Mixed hyperlipidemia: Secondary | ICD-10-CM | POA: Diagnosis not present

## 2024-04-30 DIAGNOSIS — G4733 Obstructive sleep apnea (adult) (pediatric): Secondary | ICD-10-CM | POA: Diagnosis not present

## 2024-04-30 DIAGNOSIS — I639 Cerebral infarction, unspecified: Secondary | ICD-10-CM | POA: Diagnosis not present

## 2024-04-30 DIAGNOSIS — F331 Major depressive disorder, recurrent, moderate: Secondary | ICD-10-CM | POA: Diagnosis not present

## 2024-04-30 DIAGNOSIS — E1122 Type 2 diabetes mellitus with diabetic chronic kidney disease: Secondary | ICD-10-CM | POA: Diagnosis not present

## 2024-05-03 DIAGNOSIS — E1121 Type 2 diabetes mellitus with diabetic nephropathy: Secondary | ICD-10-CM | POA: Diagnosis not present

## 2024-05-03 DIAGNOSIS — Z7409 Other reduced mobility: Secondary | ICD-10-CM | POA: Diagnosis not present

## 2024-05-05 DIAGNOSIS — E785 Hyperlipidemia, unspecified: Secondary | ICD-10-CM | POA: Diagnosis not present

## 2024-05-05 DIAGNOSIS — I639 Cerebral infarction, unspecified: Secondary | ICD-10-CM | POA: Diagnosis not present

## 2024-05-05 DIAGNOSIS — I129 Hypertensive chronic kidney disease with stage 1 through stage 4 chronic kidney disease, or unspecified chronic kidney disease: Secondary | ICD-10-CM | POA: Diagnosis not present

## 2024-05-05 DIAGNOSIS — E1122 Type 2 diabetes mellitus with diabetic chronic kidney disease: Secondary | ICD-10-CM | POA: Diagnosis not present

## 2024-05-05 DIAGNOSIS — E669 Obesity, unspecified: Secondary | ICD-10-CM | POA: Diagnosis not present

## 2024-05-05 DIAGNOSIS — N1832 Chronic kidney disease, stage 3b: Secondary | ICD-10-CM | POA: Diagnosis not present

## 2024-05-12 DIAGNOSIS — G4733 Obstructive sleep apnea (adult) (pediatric): Secondary | ICD-10-CM | POA: Diagnosis not present

## 2024-05-19 DIAGNOSIS — I129 Hypertensive chronic kidney disease with stage 1 through stage 4 chronic kidney disease, or unspecified chronic kidney disease: Secondary | ICD-10-CM | POA: Diagnosis not present

## 2024-05-19 DIAGNOSIS — N1832 Chronic kidney disease, stage 3b: Secondary | ICD-10-CM | POA: Diagnosis not present

## 2024-05-31 DIAGNOSIS — G4733 Obstructive sleep apnea (adult) (pediatric): Secondary | ICD-10-CM | POA: Diagnosis not present

## 2024-06-02 DIAGNOSIS — N1832 Chronic kidney disease, stage 3b: Secondary | ICD-10-CM | POA: Diagnosis not present

## 2024-06-03 DIAGNOSIS — E1121 Type 2 diabetes mellitus with diabetic nephropathy: Secondary | ICD-10-CM | POA: Diagnosis not present

## 2024-06-03 DIAGNOSIS — Z7409 Other reduced mobility: Secondary | ICD-10-CM | POA: Diagnosis not present

## 2024-06-26 DIAGNOSIS — E785 Hyperlipidemia, unspecified: Secondary | ICD-10-CM | POA: Diagnosis not present

## 2024-06-26 DIAGNOSIS — I10 Essential (primary) hypertension: Secondary | ICD-10-CM | POA: Diagnosis not present

## 2024-06-30 DIAGNOSIS — Z789 Other specified health status: Secondary | ICD-10-CM | POA: Diagnosis not present

## 2024-06-30 DIAGNOSIS — E782 Mixed hyperlipidemia: Secondary | ICD-10-CM | POA: Diagnosis not present

## 2024-06-30 DIAGNOSIS — N189 Chronic kidney disease, unspecified: Secondary | ICD-10-CM | POA: Diagnosis not present

## 2024-06-30 DIAGNOSIS — Z79899 Other long term (current) drug therapy: Secondary | ICD-10-CM | POA: Diagnosis not present

## 2024-06-30 DIAGNOSIS — E1165 Type 2 diabetes mellitus with hyperglycemia: Secondary | ICD-10-CM | POA: Diagnosis not present

## 2024-06-30 DIAGNOSIS — J309 Allergic rhinitis, unspecified: Secondary | ICD-10-CM | POA: Diagnosis not present

## 2024-06-30 DIAGNOSIS — I10 Essential (primary) hypertension: Secondary | ICD-10-CM | POA: Diagnosis not present

## 2024-07-01 DIAGNOSIS — G4733 Obstructive sleep apnea (adult) (pediatric): Secondary | ICD-10-CM | POA: Diagnosis not present

## 2024-07-04 DIAGNOSIS — E1121 Type 2 diabetes mellitus with diabetic nephropathy: Secondary | ICD-10-CM | POA: Diagnosis not present

## 2024-07-04 DIAGNOSIS — E119 Type 2 diabetes mellitus without complications: Secondary | ICD-10-CM | POA: Diagnosis not present

## 2024-07-04 DIAGNOSIS — Z7409 Other reduced mobility: Secondary | ICD-10-CM | POA: Diagnosis not present

## 2024-07-07 DIAGNOSIS — I6930 Unspecified sequelae of cerebral infarction: Secondary | ICD-10-CM | POA: Diagnosis not present

## 2024-07-07 DIAGNOSIS — Z9181 History of falling: Secondary | ICD-10-CM | POA: Diagnosis not present

## 2024-07-07 DIAGNOSIS — R29898 Other symptoms and signs involving the musculoskeletal system: Secondary | ICD-10-CM | POA: Diagnosis not present

## 2024-07-07 DIAGNOSIS — I10 Essential (primary) hypertension: Secondary | ICD-10-CM | POA: Diagnosis not present

## 2024-07-11 DIAGNOSIS — H04123 Dry eye syndrome of bilateral lacrimal glands: Secondary | ICD-10-CM | POA: Diagnosis not present

## 2024-07-11 DIAGNOSIS — H25043 Posterior subcapsular polar age-related cataract, bilateral: Secondary | ICD-10-CM | POA: Diagnosis not present

## 2024-07-14 DIAGNOSIS — D259 Leiomyoma of uterus, unspecified: Secondary | ICD-10-CM | POA: Diagnosis not present

## 2024-07-14 DIAGNOSIS — F0153 Vascular dementia, unspecified severity, with mood disturbance: Secondary | ICD-10-CM | POA: Diagnosis not present

## 2024-07-14 DIAGNOSIS — E1122 Type 2 diabetes mellitus with diabetic chronic kidney disease: Secondary | ICD-10-CM | POA: Diagnosis not present

## 2024-07-14 DIAGNOSIS — Z6838 Body mass index (BMI) 38.0-38.9, adult: Secondary | ICD-10-CM | POA: Diagnosis not present

## 2024-07-14 DIAGNOSIS — Z87891 Personal history of nicotine dependence: Secondary | ICD-10-CM | POA: Diagnosis not present

## 2024-07-14 DIAGNOSIS — Z9181 History of falling: Secondary | ICD-10-CM | POA: Diagnosis not present

## 2024-07-14 DIAGNOSIS — G4733 Obstructive sleep apnea (adult) (pediatric): Secondary | ICD-10-CM | POA: Diagnosis not present

## 2024-07-14 DIAGNOSIS — F331 Major depressive disorder, recurrent, moderate: Secondary | ICD-10-CM | POA: Diagnosis not present

## 2024-07-14 DIAGNOSIS — I129 Hypertensive chronic kidney disease with stage 1 through stage 4 chronic kidney disease, or unspecified chronic kidney disease: Secondary | ICD-10-CM | POA: Diagnosis not present

## 2024-07-14 DIAGNOSIS — Z556 Problems related to health literacy: Secondary | ICD-10-CM | POA: Diagnosis not present

## 2024-07-14 DIAGNOSIS — I69351 Hemiplegia and hemiparesis following cerebral infarction affecting right dominant side: Secondary | ICD-10-CM | POA: Diagnosis not present

## 2024-07-14 DIAGNOSIS — N1832 Chronic kidney disease, stage 3b: Secondary | ICD-10-CM | POA: Diagnosis not present

## 2024-07-14 DIAGNOSIS — R32 Unspecified urinary incontinence: Secondary | ICD-10-CM | POA: Diagnosis not present

## 2024-07-14 DIAGNOSIS — E1143 Type 2 diabetes mellitus with diabetic autonomic (poly)neuropathy: Secondary | ICD-10-CM | POA: Diagnosis not present

## 2024-07-14 DIAGNOSIS — D631 Anemia in chronic kidney disease: Secondary | ICD-10-CM | POA: Diagnosis not present

## 2024-07-17 DIAGNOSIS — E1143 Type 2 diabetes mellitus with diabetic autonomic (poly)neuropathy: Secondary | ICD-10-CM | POA: Diagnosis not present

## 2024-07-22 DIAGNOSIS — N1832 Chronic kidney disease, stage 3b: Secondary | ICD-10-CM | POA: Diagnosis not present

## 2024-07-23 DIAGNOSIS — E1165 Type 2 diabetes mellitus with hyperglycemia: Secondary | ICD-10-CM | POA: Diagnosis not present

## 2024-07-23 DIAGNOSIS — E1121 Type 2 diabetes mellitus with diabetic nephropathy: Secondary | ICD-10-CM | POA: Diagnosis not present

## 2024-07-23 DIAGNOSIS — F0150 Vascular dementia without behavioral disturbance: Secondary | ICD-10-CM | POA: Diagnosis not present

## 2024-07-23 DIAGNOSIS — I129 Hypertensive chronic kidney disease with stage 1 through stage 4 chronic kidney disease, or unspecified chronic kidney disease: Secondary | ICD-10-CM | POA: Diagnosis not present

## 2024-07-23 DIAGNOSIS — I10 Essential (primary) hypertension: Secondary | ICD-10-CM | POA: Diagnosis not present

## 2024-07-23 DIAGNOSIS — E1122 Type 2 diabetes mellitus with diabetic chronic kidney disease: Secondary | ICD-10-CM | POA: Diagnosis not present

## 2024-07-23 DIAGNOSIS — E782 Mixed hyperlipidemia: Secondary | ICD-10-CM | POA: Diagnosis not present

## 2024-07-23 DIAGNOSIS — Z7185 Encounter for immunization safety counseling: Secondary | ICD-10-CM | POA: Diagnosis not present

## 2024-07-23 DIAGNOSIS — I6930 Unspecified sequelae of cerebral infarction: Secondary | ICD-10-CM | POA: Diagnosis not present

## 2024-07-23 DIAGNOSIS — Z23 Encounter for immunization: Secondary | ICD-10-CM | POA: Diagnosis not present

## 2024-07-24 DIAGNOSIS — I6930 Unspecified sequelae of cerebral infarction: Secondary | ICD-10-CM | POA: Diagnosis not present

## 2024-07-24 DIAGNOSIS — Z7185 Encounter for immunization safety counseling: Secondary | ICD-10-CM | POA: Diagnosis not present

## 2024-07-24 DIAGNOSIS — Z23 Encounter for immunization: Secondary | ICD-10-CM | POA: Diagnosis not present

## 2024-07-25 DIAGNOSIS — E119 Type 2 diabetes mellitus without complications: Secondary | ICD-10-CM | POA: Diagnosis not present

## 2024-07-25 DIAGNOSIS — F331 Major depressive disorder, recurrent, moderate: Secondary | ICD-10-CM | POA: Diagnosis not present

## 2024-07-25 DIAGNOSIS — E1122 Type 2 diabetes mellitus with diabetic chronic kidney disease: Secondary | ICD-10-CM | POA: Diagnosis not present

## 2024-07-25 DIAGNOSIS — Z6838 Body mass index (BMI) 38.0-38.9, adult: Secondary | ICD-10-CM | POA: Diagnosis not present

## 2024-07-25 DIAGNOSIS — R32 Unspecified urinary incontinence: Secondary | ICD-10-CM | POA: Diagnosis not present

## 2024-07-25 DIAGNOSIS — Z87891 Personal history of nicotine dependence: Secondary | ICD-10-CM | POA: Diagnosis not present

## 2024-07-25 DIAGNOSIS — I69351 Hemiplegia and hemiparesis following cerebral infarction affecting right dominant side: Secondary | ICD-10-CM | POA: Diagnosis not present

## 2024-07-25 DIAGNOSIS — G4733 Obstructive sleep apnea (adult) (pediatric): Secondary | ICD-10-CM | POA: Diagnosis not present

## 2024-07-25 DIAGNOSIS — Z9181 History of falling: Secondary | ICD-10-CM | POA: Diagnosis not present

## 2024-07-25 DIAGNOSIS — Z556 Problems related to health literacy: Secondary | ICD-10-CM | POA: Diagnosis not present

## 2024-07-25 DIAGNOSIS — F0153 Vascular dementia, unspecified severity, with mood disturbance: Secondary | ICD-10-CM | POA: Diagnosis not present

## 2024-07-25 DIAGNOSIS — N1832 Chronic kidney disease, stage 3b: Secondary | ICD-10-CM | POA: Diagnosis not present

## 2024-07-25 DIAGNOSIS — D259 Leiomyoma of uterus, unspecified: Secondary | ICD-10-CM | POA: Diagnosis not present

## 2024-07-25 DIAGNOSIS — E1143 Type 2 diabetes mellitus with diabetic autonomic (poly)neuropathy: Secondary | ICD-10-CM | POA: Diagnosis not present

## 2024-07-25 DIAGNOSIS — D631 Anemia in chronic kidney disease: Secondary | ICD-10-CM | POA: Diagnosis not present

## 2024-07-25 DIAGNOSIS — I129 Hypertensive chronic kidney disease with stage 1 through stage 4 chronic kidney disease, or unspecified chronic kidney disease: Secondary | ICD-10-CM | POA: Diagnosis not present

## 2024-07-29 DIAGNOSIS — I69351 Hemiplegia and hemiparesis following cerebral infarction affecting right dominant side: Secondary | ICD-10-CM | POA: Diagnosis not present

## 2024-07-29 DIAGNOSIS — I129 Hypertensive chronic kidney disease with stage 1 through stage 4 chronic kidney disease, or unspecified chronic kidney disease: Secondary | ICD-10-CM | POA: Diagnosis not present

## 2024-07-29 DIAGNOSIS — Z6838 Body mass index (BMI) 38.0-38.9, adult: Secondary | ICD-10-CM | POA: Diagnosis not present

## 2024-07-29 DIAGNOSIS — N1832 Chronic kidney disease, stage 3b: Secondary | ICD-10-CM | POA: Diagnosis not present

## 2024-07-29 DIAGNOSIS — D259 Leiomyoma of uterus, unspecified: Secondary | ICD-10-CM | POA: Diagnosis not present

## 2024-07-29 DIAGNOSIS — E1122 Type 2 diabetes mellitus with diabetic chronic kidney disease: Secondary | ICD-10-CM | POA: Diagnosis not present

## 2024-07-29 DIAGNOSIS — G4733 Obstructive sleep apnea (adult) (pediatric): Secondary | ICD-10-CM | POA: Diagnosis not present

## 2024-07-29 DIAGNOSIS — D631 Anemia in chronic kidney disease: Secondary | ICD-10-CM | POA: Diagnosis not present

## 2024-07-29 DIAGNOSIS — F331 Major depressive disorder, recurrent, moderate: Secondary | ICD-10-CM | POA: Diagnosis not present

## 2024-07-29 DIAGNOSIS — R32 Unspecified urinary incontinence: Secondary | ICD-10-CM | POA: Diagnosis not present

## 2024-07-29 DIAGNOSIS — Z556 Problems related to health literacy: Secondary | ICD-10-CM | POA: Diagnosis not present

## 2024-07-29 DIAGNOSIS — F0153 Vascular dementia, unspecified severity, with mood disturbance: Secondary | ICD-10-CM | POA: Diagnosis not present

## 2024-07-29 DIAGNOSIS — Z87891 Personal history of nicotine dependence: Secondary | ICD-10-CM | POA: Diagnosis not present

## 2024-07-29 DIAGNOSIS — Z9181 History of falling: Secondary | ICD-10-CM | POA: Diagnosis not present

## 2024-07-29 DIAGNOSIS — E1143 Type 2 diabetes mellitus with diabetic autonomic (poly)neuropathy: Secondary | ICD-10-CM | POA: Diagnosis not present

## 2024-07-30 DIAGNOSIS — H2189 Other specified disorders of iris and ciliary body: Secondary | ICD-10-CM | POA: Diagnosis not present

## 2024-07-30 DIAGNOSIS — H25812 Combined forms of age-related cataract, left eye: Secondary | ICD-10-CM | POA: Diagnosis not present

## 2024-07-30 DIAGNOSIS — H25042 Posterior subcapsular polar age-related cataract, left eye: Secondary | ICD-10-CM | POA: Diagnosis not present

## 2024-07-30 DIAGNOSIS — H2512 Age-related nuclear cataract, left eye: Secondary | ICD-10-CM | POA: Diagnosis not present

## 2024-08-03 DIAGNOSIS — E1121 Type 2 diabetes mellitus with diabetic nephropathy: Secondary | ICD-10-CM | POA: Diagnosis not present

## 2024-08-03 DIAGNOSIS — Z7409 Other reduced mobility: Secondary | ICD-10-CM | POA: Diagnosis not present

## 2024-08-04 DIAGNOSIS — I69351 Hemiplegia and hemiparesis following cerebral infarction affecting right dominant side: Secondary | ICD-10-CM | POA: Diagnosis not present

## 2024-08-04 DIAGNOSIS — F0153 Vascular dementia, unspecified severity, with mood disturbance: Secondary | ICD-10-CM | POA: Diagnosis not present

## 2024-08-04 DIAGNOSIS — D259 Leiomyoma of uterus, unspecified: Secondary | ICD-10-CM | POA: Diagnosis not present

## 2024-08-04 DIAGNOSIS — N1832 Chronic kidney disease, stage 3b: Secondary | ICD-10-CM | POA: Diagnosis not present

## 2024-08-04 DIAGNOSIS — D631 Anemia in chronic kidney disease: Secondary | ICD-10-CM | POA: Diagnosis not present

## 2024-08-04 DIAGNOSIS — R32 Unspecified urinary incontinence: Secondary | ICD-10-CM | POA: Diagnosis not present

## 2024-08-04 DIAGNOSIS — G4733 Obstructive sleep apnea (adult) (pediatric): Secondary | ICD-10-CM | POA: Diagnosis not present

## 2024-08-04 DIAGNOSIS — Z87891 Personal history of nicotine dependence: Secondary | ICD-10-CM | POA: Diagnosis not present

## 2024-08-04 DIAGNOSIS — Z6838 Body mass index (BMI) 38.0-38.9, adult: Secondary | ICD-10-CM | POA: Diagnosis not present

## 2024-08-04 DIAGNOSIS — I129 Hypertensive chronic kidney disease with stage 1 through stage 4 chronic kidney disease, or unspecified chronic kidney disease: Secondary | ICD-10-CM | POA: Diagnosis not present

## 2024-08-04 DIAGNOSIS — F331 Major depressive disorder, recurrent, moderate: Secondary | ICD-10-CM | POA: Diagnosis not present

## 2024-08-04 DIAGNOSIS — E1143 Type 2 diabetes mellitus with diabetic autonomic (poly)neuropathy: Secondary | ICD-10-CM | POA: Diagnosis not present

## 2024-08-04 DIAGNOSIS — Z9181 History of falling: Secondary | ICD-10-CM | POA: Diagnosis not present

## 2024-08-04 DIAGNOSIS — Z556 Problems related to health literacy: Secondary | ICD-10-CM | POA: Diagnosis not present

## 2024-08-04 DIAGNOSIS — E1122 Type 2 diabetes mellitus with diabetic chronic kidney disease: Secondary | ICD-10-CM | POA: Diagnosis not present

## 2024-08-13 DIAGNOSIS — E118 Type 2 diabetes mellitus with unspecified complications: Secondary | ICD-10-CM | POA: Diagnosis not present

## 2024-08-13 DIAGNOSIS — N189 Chronic kidney disease, unspecified: Secondary | ICD-10-CM | POA: Diagnosis not present

## 2024-08-13 DIAGNOSIS — E1122 Type 2 diabetes mellitus with diabetic chronic kidney disease: Secondary | ICD-10-CM | POA: Diagnosis not present

## 2024-08-13 DIAGNOSIS — I129 Hypertensive chronic kidney disease with stage 1 through stage 4 chronic kidney disease, or unspecified chronic kidney disease: Secondary | ICD-10-CM | POA: Diagnosis not present

## 2024-08-13 DIAGNOSIS — N938 Other specified abnormal uterine and vaginal bleeding: Secondary | ICD-10-CM | POA: Diagnosis not present

## 2024-08-13 DIAGNOSIS — E1165 Type 2 diabetes mellitus with hyperglycemia: Secondary | ICD-10-CM | POA: Diagnosis not present

## 2024-08-13 DIAGNOSIS — I10 Essential (primary) hypertension: Secondary | ICD-10-CM | POA: Diagnosis not present

## 2024-08-14 DIAGNOSIS — N1832 Chronic kidney disease, stage 3b: Secondary | ICD-10-CM | POA: Diagnosis not present

## 2024-08-14 DIAGNOSIS — Z87891 Personal history of nicotine dependence: Secondary | ICD-10-CM | POA: Diagnosis not present

## 2024-08-14 DIAGNOSIS — I69351 Hemiplegia and hemiparesis following cerebral infarction affecting right dominant side: Secondary | ICD-10-CM | POA: Diagnosis not present

## 2024-08-14 DIAGNOSIS — D259 Leiomyoma of uterus, unspecified: Secondary | ICD-10-CM | POA: Diagnosis not present

## 2024-08-14 DIAGNOSIS — E1122 Type 2 diabetes mellitus with diabetic chronic kidney disease: Secondary | ICD-10-CM | POA: Diagnosis not present

## 2024-08-14 DIAGNOSIS — F0153 Vascular dementia, unspecified severity, with mood disturbance: Secondary | ICD-10-CM | POA: Diagnosis not present

## 2024-08-14 DIAGNOSIS — F331 Major depressive disorder, recurrent, moderate: Secondary | ICD-10-CM | POA: Diagnosis not present

## 2024-08-14 DIAGNOSIS — R32 Unspecified urinary incontinence: Secondary | ICD-10-CM | POA: Diagnosis not present

## 2024-08-14 DIAGNOSIS — Z9181 History of falling: Secondary | ICD-10-CM | POA: Diagnosis not present

## 2024-08-14 DIAGNOSIS — G4722 Circadian rhythm sleep disorder, advanced sleep phase type: Secondary | ICD-10-CM | POA: Diagnosis not present

## 2024-08-14 DIAGNOSIS — Z556 Problems related to health literacy: Secondary | ICD-10-CM | POA: Diagnosis not present

## 2024-08-14 DIAGNOSIS — D631 Anemia in chronic kidney disease: Secondary | ICD-10-CM | POA: Diagnosis not present

## 2024-08-14 DIAGNOSIS — I129 Hypertensive chronic kidney disease with stage 1 through stage 4 chronic kidney disease, or unspecified chronic kidney disease: Secondary | ICD-10-CM | POA: Diagnosis not present

## 2024-08-14 DIAGNOSIS — G4733 Obstructive sleep apnea (adult) (pediatric): Secondary | ICD-10-CM | POA: Diagnosis not present

## 2024-08-14 DIAGNOSIS — I69391 Dysphagia following cerebral infarction: Secondary | ICD-10-CM | POA: Diagnosis not present

## 2024-08-14 DIAGNOSIS — Z6838 Body mass index (BMI) 38.0-38.9, adult: Secondary | ICD-10-CM | POA: Diagnosis not present

## 2024-08-14 DIAGNOSIS — E1143 Type 2 diabetes mellitus with diabetic autonomic (poly)neuropathy: Secondary | ICD-10-CM | POA: Diagnosis not present

## 2024-08-18 DIAGNOSIS — N950 Postmenopausal bleeding: Secondary | ICD-10-CM | POA: Diagnosis not present

## 2024-08-18 DIAGNOSIS — N939 Abnormal uterine and vaginal bleeding, unspecified: Secondary | ICD-10-CM | POA: Diagnosis not present

## 2024-08-19 DIAGNOSIS — N1832 Chronic kidney disease, stage 3b: Secondary | ICD-10-CM | POA: Diagnosis not present

## 2024-08-19 DIAGNOSIS — Z556 Problems related to health literacy: Secondary | ICD-10-CM | POA: Diagnosis not present

## 2024-08-19 DIAGNOSIS — I129 Hypertensive chronic kidney disease with stage 1 through stage 4 chronic kidney disease, or unspecified chronic kidney disease: Secondary | ICD-10-CM | POA: Diagnosis not present

## 2024-08-19 DIAGNOSIS — E1122 Type 2 diabetes mellitus with diabetic chronic kidney disease: Secondary | ICD-10-CM | POA: Diagnosis not present

## 2024-08-19 DIAGNOSIS — Z6838 Body mass index (BMI) 38.0-38.9, adult: Secondary | ICD-10-CM | POA: Diagnosis not present

## 2024-08-19 DIAGNOSIS — D259 Leiomyoma of uterus, unspecified: Secondary | ICD-10-CM | POA: Diagnosis not present

## 2024-08-19 DIAGNOSIS — R32 Unspecified urinary incontinence: Secondary | ICD-10-CM | POA: Diagnosis not present

## 2024-08-19 DIAGNOSIS — Z87891 Personal history of nicotine dependence: Secondary | ICD-10-CM | POA: Diagnosis not present

## 2024-08-19 DIAGNOSIS — I69351 Hemiplegia and hemiparesis following cerebral infarction affecting right dominant side: Secondary | ICD-10-CM | POA: Diagnosis not present

## 2024-08-19 DIAGNOSIS — F331 Major depressive disorder, recurrent, moderate: Secondary | ICD-10-CM | POA: Diagnosis not present

## 2024-08-19 DIAGNOSIS — D631 Anemia in chronic kidney disease: Secondary | ICD-10-CM | POA: Diagnosis not present

## 2024-08-19 DIAGNOSIS — E1143 Type 2 diabetes mellitus with diabetic autonomic (poly)neuropathy: Secondary | ICD-10-CM | POA: Diagnosis not present

## 2024-08-19 DIAGNOSIS — Z9181 History of falling: Secondary | ICD-10-CM | POA: Diagnosis not present

## 2024-08-19 DIAGNOSIS — G4733 Obstructive sleep apnea (adult) (pediatric): Secondary | ICD-10-CM | POA: Diagnosis not present

## 2024-08-19 DIAGNOSIS — F0153 Vascular dementia, unspecified severity, with mood disturbance: Secondary | ICD-10-CM | POA: Diagnosis not present

## 2024-08-20 DIAGNOSIS — E1122 Type 2 diabetes mellitus with diabetic chronic kidney disease: Secondary | ICD-10-CM | POA: Diagnosis not present

## 2024-08-20 DIAGNOSIS — E785 Hyperlipidemia, unspecified: Secondary | ICD-10-CM | POA: Diagnosis not present

## 2024-08-20 DIAGNOSIS — I639 Cerebral infarction, unspecified: Secondary | ICD-10-CM | POA: Diagnosis not present

## 2024-08-20 DIAGNOSIS — E669 Obesity, unspecified: Secondary | ICD-10-CM | POA: Diagnosis not present

## 2024-08-20 DIAGNOSIS — N184 Chronic kidney disease, stage 4 (severe): Secondary | ICD-10-CM | POA: Diagnosis not present

## 2024-08-20 DIAGNOSIS — I129 Hypertensive chronic kidney disease with stage 1 through stage 4 chronic kidney disease, or unspecified chronic kidney disease: Secondary | ICD-10-CM | POA: Diagnosis not present

## 2024-08-21 DIAGNOSIS — E119 Type 2 diabetes mellitus without complications: Secondary | ICD-10-CM | POA: Diagnosis not present

## 2024-08-24 DIAGNOSIS — Z743 Need for continuous supervision: Secondary | ICD-10-CM | POA: Diagnosis not present

## 2024-08-24 DIAGNOSIS — R109 Unspecified abdominal pain: Secondary | ICD-10-CM | POA: Diagnosis not present

## 2024-08-24 DIAGNOSIS — R42 Dizziness and giddiness: Secondary | ICD-10-CM | POA: Diagnosis not present

## 2024-08-24 DIAGNOSIS — R140 Abdominal distension (gaseous): Secondary | ICD-10-CM | POA: Diagnosis not present

## 2024-08-24 DIAGNOSIS — N920 Excessive and frequent menstruation with regular cycle: Secondary | ICD-10-CM | POA: Diagnosis not present

## 2024-08-25 DIAGNOSIS — D259 Leiomyoma of uterus, unspecified: Secondary | ICD-10-CM | POA: Diagnosis not present

## 2024-08-26 DIAGNOSIS — N920 Excessive and frequent menstruation with regular cycle: Secondary | ICD-10-CM | POA: Diagnosis not present

## 2024-08-27 DIAGNOSIS — I10 Essential (primary) hypertension: Secondary | ICD-10-CM | POA: Diagnosis not present

## 2024-08-27 DIAGNOSIS — H25811 Combined forms of age-related cataract, right eye: Secondary | ICD-10-CM | POA: Diagnosis not present

## 2024-08-27 DIAGNOSIS — H2511 Age-related nuclear cataract, right eye: Secondary | ICD-10-CM | POA: Diagnosis not present

## 2024-08-27 DIAGNOSIS — H2189 Other specified disorders of iris and ciliary body: Secondary | ICD-10-CM | POA: Diagnosis not present

## 2024-08-27 DIAGNOSIS — E7849 Other hyperlipidemia: Secondary | ICD-10-CM | POA: Diagnosis not present

## 2024-08-27 DIAGNOSIS — H25011 Cortical age-related cataract, right eye: Secondary | ICD-10-CM | POA: Diagnosis not present

## 2024-09-02 DIAGNOSIS — E1122 Type 2 diabetes mellitus with diabetic chronic kidney disease: Secondary | ICD-10-CM | POA: Diagnosis not present

## 2024-09-02 DIAGNOSIS — I129 Hypertensive chronic kidney disease with stage 1 through stage 4 chronic kidney disease, or unspecified chronic kidney disease: Secondary | ICD-10-CM | POA: Diagnosis not present

## 2024-09-02 DIAGNOSIS — Z556 Problems related to health literacy: Secondary | ICD-10-CM | POA: Diagnosis not present

## 2024-09-02 DIAGNOSIS — R32 Unspecified urinary incontinence: Secondary | ICD-10-CM | POA: Diagnosis not present

## 2024-09-02 DIAGNOSIS — Z87891 Personal history of nicotine dependence: Secondary | ICD-10-CM | POA: Diagnosis not present

## 2024-09-02 DIAGNOSIS — G4733 Obstructive sleep apnea (adult) (pediatric): Secondary | ICD-10-CM | POA: Diagnosis not present

## 2024-09-02 DIAGNOSIS — Z9181 History of falling: Secondary | ICD-10-CM | POA: Diagnosis not present

## 2024-09-02 DIAGNOSIS — D259 Leiomyoma of uterus, unspecified: Secondary | ICD-10-CM | POA: Diagnosis not present

## 2024-09-02 DIAGNOSIS — Z6838 Body mass index (BMI) 38.0-38.9, adult: Secondary | ICD-10-CM | POA: Diagnosis not present

## 2024-09-02 DIAGNOSIS — F0153 Vascular dementia, unspecified severity, with mood disturbance: Secondary | ICD-10-CM | POA: Diagnosis not present

## 2024-09-02 DIAGNOSIS — N1832 Chronic kidney disease, stage 3b: Secondary | ICD-10-CM | POA: Diagnosis not present

## 2024-09-02 DIAGNOSIS — E1143 Type 2 diabetes mellitus with diabetic autonomic (poly)neuropathy: Secondary | ICD-10-CM | POA: Diagnosis not present

## 2024-09-02 DIAGNOSIS — I69351 Hemiplegia and hemiparesis following cerebral infarction affecting right dominant side: Secondary | ICD-10-CM | POA: Diagnosis not present

## 2024-09-02 DIAGNOSIS — D631 Anemia in chronic kidney disease: Secondary | ICD-10-CM | POA: Diagnosis not present

## 2024-09-03 DIAGNOSIS — E1122 Type 2 diabetes mellitus with diabetic chronic kidney disease: Secondary | ICD-10-CM | POA: Diagnosis not present

## 2024-09-03 DIAGNOSIS — Z9181 History of falling: Secondary | ICD-10-CM | POA: Diagnosis not present

## 2024-09-03 DIAGNOSIS — Z1339 Encounter for screening examination for other mental health and behavioral disorders: Secondary | ICD-10-CM | POA: Diagnosis not present

## 2024-09-03 DIAGNOSIS — N1832 Chronic kidney disease, stage 3b: Secondary | ICD-10-CM | POA: Diagnosis not present

## 2024-09-03 DIAGNOSIS — R269 Unspecified abnormalities of gait and mobility: Secondary | ICD-10-CM | POA: Diagnosis not present

## 2024-09-03 DIAGNOSIS — Z87898 Personal history of other specified conditions: Secondary | ICD-10-CM | POA: Diagnosis not present

## 2024-09-03 DIAGNOSIS — D500 Iron deficiency anemia secondary to blood loss (chronic): Secondary | ICD-10-CM | POA: Diagnosis not present

## 2024-09-03 DIAGNOSIS — Z0000 Encounter for general adult medical examination without abnormal findings: Secondary | ICD-10-CM | POA: Diagnosis not present

## 2024-09-03 DIAGNOSIS — D259 Leiomyoma of uterus, unspecified: Secondary | ICD-10-CM | POA: Diagnosis not present

## 2024-09-03 DIAGNOSIS — N920 Excessive and frequent menstruation with regular cycle: Secondary | ICD-10-CM | POA: Diagnosis not present

## 2024-09-03 DIAGNOSIS — Z1331 Encounter for screening for depression: Secondary | ICD-10-CM | POA: Diagnosis not present

## 2024-09-03 DIAGNOSIS — R001 Bradycardia, unspecified: Secondary | ICD-10-CM | POA: Diagnosis not present

## 2024-09-08 DIAGNOSIS — Z124 Encounter for screening for malignant neoplasm of cervix: Secondary | ICD-10-CM | POA: Diagnosis not present

## 2024-09-08 DIAGNOSIS — Z9181 History of falling: Secondary | ICD-10-CM | POA: Diagnosis not present

## 2024-09-08 DIAGNOSIS — E1143 Type 2 diabetes mellitus with diabetic autonomic (poly)neuropathy: Secondary | ICD-10-CM | POA: Diagnosis not present

## 2024-09-08 DIAGNOSIS — N1832 Chronic kidney disease, stage 3b: Secondary | ICD-10-CM | POA: Diagnosis not present

## 2024-09-08 DIAGNOSIS — F331 Major depressive disorder, recurrent, moderate: Secondary | ICD-10-CM | POA: Diagnosis not present

## 2024-09-08 DIAGNOSIS — Z6838 Body mass index (BMI) 38.0-38.9, adult: Secondary | ICD-10-CM | POA: Diagnosis not present

## 2024-09-08 DIAGNOSIS — Z87891 Personal history of nicotine dependence: Secondary | ICD-10-CM | POA: Diagnosis not present

## 2024-09-08 DIAGNOSIS — Z556 Problems related to health literacy: Secondary | ICD-10-CM | POA: Diagnosis not present

## 2024-09-08 DIAGNOSIS — I129 Hypertensive chronic kidney disease with stage 1 through stage 4 chronic kidney disease, or unspecified chronic kidney disease: Secondary | ICD-10-CM | POA: Diagnosis not present

## 2024-09-08 DIAGNOSIS — D631 Anemia in chronic kidney disease: Secondary | ICD-10-CM | POA: Diagnosis not present

## 2024-09-08 DIAGNOSIS — D259 Leiomyoma of uterus, unspecified: Secondary | ICD-10-CM | POA: Diagnosis not present

## 2024-09-08 DIAGNOSIS — R32 Unspecified urinary incontinence: Secondary | ICD-10-CM | POA: Diagnosis not present

## 2024-09-08 DIAGNOSIS — I69351 Hemiplegia and hemiparesis following cerebral infarction affecting right dominant side: Secondary | ICD-10-CM | POA: Diagnosis not present

## 2024-09-08 DIAGNOSIS — E1122 Type 2 diabetes mellitus with diabetic chronic kidney disease: Secondary | ICD-10-CM | POA: Diagnosis not present

## 2024-09-08 DIAGNOSIS — N921 Excessive and frequent menstruation with irregular cycle: Secondary | ICD-10-CM | POA: Diagnosis not present

## 2024-09-08 DIAGNOSIS — G4733 Obstructive sleep apnea (adult) (pediatric): Secondary | ICD-10-CM | POA: Diagnosis not present

## 2024-09-08 DIAGNOSIS — F0153 Vascular dementia, unspecified severity, with mood disturbance: Secondary | ICD-10-CM | POA: Diagnosis not present

## 2024-09-15 DIAGNOSIS — D500 Iron deficiency anemia secondary to blood loss (chronic): Secondary | ICD-10-CM | POA: Diagnosis not present

## 2024-09-15 DIAGNOSIS — R7989 Other specified abnormal findings of blood chemistry: Secondary | ICD-10-CM | POA: Diagnosis not present

## 2024-09-15 DIAGNOSIS — E785 Hyperlipidemia, unspecified: Secondary | ICD-10-CM | POA: Diagnosis not present

## 2024-09-15 DIAGNOSIS — I10 Essential (primary) hypertension: Secondary | ICD-10-CM | POA: Diagnosis not present

## 2024-09-17 DIAGNOSIS — E782 Mixed hyperlipidemia: Secondary | ICD-10-CM | POA: Diagnosis not present

## 2024-09-17 DIAGNOSIS — Z79899 Other long term (current) drug therapy: Secondary | ICD-10-CM | POA: Diagnosis not present

## 2024-09-17 DIAGNOSIS — N920 Excessive and frequent menstruation with regular cycle: Secondary | ICD-10-CM | POA: Diagnosis not present

## 2024-09-17 DIAGNOSIS — Z789 Other specified health status: Secondary | ICD-10-CM | POA: Diagnosis not present

## 2024-09-17 DIAGNOSIS — E119 Type 2 diabetes mellitus without complications: Secondary | ICD-10-CM | POA: Diagnosis not present

## 2024-09-17 DIAGNOSIS — D509 Iron deficiency anemia, unspecified: Secondary | ICD-10-CM | POA: Diagnosis not present

## 2024-09-17 DIAGNOSIS — N184 Chronic kidney disease, stage 4 (severe): Secondary | ICD-10-CM | POA: Diagnosis not present

## 2024-09-17 DIAGNOSIS — I10 Essential (primary) hypertension: Secondary | ICD-10-CM | POA: Diagnosis not present

## 2024-09-17 DIAGNOSIS — R001 Bradycardia, unspecified: Secondary | ICD-10-CM | POA: Diagnosis not present

## 2024-09-18 DIAGNOSIS — N921 Excessive and frequent menstruation with irregular cycle: Secondary | ICD-10-CM | POA: Diagnosis not present

## 2024-09-22 DIAGNOSIS — Z0000 Encounter for general adult medical examination without abnormal findings: Secondary | ICD-10-CM | POA: Diagnosis not present

## 2024-09-29 DIAGNOSIS — G4733 Obstructive sleep apnea (adult) (pediatric): Secondary | ICD-10-CM | POA: Diagnosis not present

## 2024-09-30 DIAGNOSIS — Z1211 Encounter for screening for malignant neoplasm of colon: Secondary | ICD-10-CM | POA: Diagnosis not present

## 2024-09-30 DIAGNOSIS — R140 Abdominal distension (gaseous): Secondary | ICD-10-CM | POA: Diagnosis not present

## 2024-09-30 DIAGNOSIS — D638 Anemia in other chronic diseases classified elsewhere: Secondary | ICD-10-CM | POA: Diagnosis not present

## 2024-10-13 DIAGNOSIS — E119 Type 2 diabetes mellitus without complications: Secondary | ICD-10-CM | POA: Diagnosis not present
